# Patient Record
Sex: Male | Born: 1945 | Race: Black or African American | Hispanic: No | Marital: Married | Smoking: Former smoker
Health system: Southern US, Community
[De-identification: ages and names within clinical notes are randomized; demographics above are authoritative.]

## PROBLEM LIST (undated history)

## (undated) DIAGNOSIS — R296 Repeated falls: Secondary | ICD-10-CM

## (undated) DIAGNOSIS — I4891 Unspecified atrial fibrillation: Secondary | ICD-10-CM

## (undated) DIAGNOSIS — Z91199 Patient's noncompliance with other medical treatment and regimen due to unspecified reason: Secondary | ICD-10-CM

## (undated) DIAGNOSIS — J449 Chronic obstructive pulmonary disease, unspecified: Secondary | ICD-10-CM

## (undated) DIAGNOSIS — R531 Weakness: Secondary | ICD-10-CM

## (undated) DIAGNOSIS — I679 Cerebrovascular disease, unspecified: Secondary | ICD-10-CM

## (undated) DIAGNOSIS — I10 Essential (primary) hypertension: Secondary | ICD-10-CM

## (undated) DIAGNOSIS — K5900 Constipation, unspecified: Secondary | ICD-10-CM

## (undated) DIAGNOSIS — F1010 Alcohol abuse, uncomplicated: Secondary | ICD-10-CM

## (undated) DIAGNOSIS — N189 Chronic kidney disease, unspecified: Secondary | ICD-10-CM

## (undated) DIAGNOSIS — I219 Acute myocardial infarction, unspecified: Secondary | ICD-10-CM

## (undated) DIAGNOSIS — I5022 Chronic systolic (congestive) heart failure: Secondary | ICD-10-CM

## (undated) DIAGNOSIS — M1990 Unspecified osteoarthritis, unspecified site: Secondary | ICD-10-CM

## (undated) DIAGNOSIS — F32A Depression, unspecified: Secondary | ICD-10-CM

## (undated) DIAGNOSIS — I714 Abdominal aortic aneurysm, without rupture, unspecified: Secondary | ICD-10-CM

## (undated) DIAGNOSIS — R6889 Other general symptoms and signs: Secondary | ICD-10-CM

## (undated) DIAGNOSIS — Z9119 Patient's noncompliance with other medical treatment and regimen: Secondary | ICD-10-CM

## (undated) DIAGNOSIS — E785 Hyperlipidemia, unspecified: Secondary | ICD-10-CM

## (undated) DIAGNOSIS — I2510 Atherosclerotic heart disease of native coronary artery without angina pectoris: Secondary | ICD-10-CM

## (undated) DIAGNOSIS — I639 Cerebral infarction, unspecified: Secondary | ICD-10-CM

## (undated) DIAGNOSIS — Z720 Tobacco use: Secondary | ICD-10-CM

## (undated) DIAGNOSIS — D649 Anemia, unspecified: Secondary | ICD-10-CM

## (undated) DIAGNOSIS — K219 Gastro-esophageal reflux disease without esophagitis: Secondary | ICD-10-CM

## (undated) DIAGNOSIS — M79606 Pain in leg, unspecified: Secondary | ICD-10-CM

## (undated) DIAGNOSIS — F419 Anxiety disorder, unspecified: Secondary | ICD-10-CM

## (undated) DIAGNOSIS — F329 Major depressive disorder, single episode, unspecified: Secondary | ICD-10-CM

## (undated) DIAGNOSIS — M25461 Effusion, right knee: Secondary | ICD-10-CM

## (undated) DIAGNOSIS — F0390 Unspecified dementia without behavioral disturbance: Secondary | ICD-10-CM

## (undated) DIAGNOSIS — N400 Enlarged prostate without lower urinary tract symptoms: Secondary | ICD-10-CM

## (undated) DIAGNOSIS — R260 Ataxic gait: Secondary | ICD-10-CM

## (undated) HISTORY — DX: Patient's noncompliance with other medical treatment and regimen due to unspecified reason: Z91.199

## (undated) HISTORY — DX: Chronic obstructive pulmonary disease, unspecified: J44.9

## (undated) HISTORY — DX: Tobacco use: Z72.0

## (undated) HISTORY — DX: Anemia, unspecified: D64.9

## (undated) HISTORY — DX: Patient's noncompliance with other medical treatment and regimen: Z91.19

## (undated) HISTORY — DX: Alcohol abuse, uncomplicated: F10.10

## (undated) HISTORY — DX: Cerebrovascular disease, unspecified: I67.9

## (undated) HISTORY — DX: Abdominal aortic aneurysm, without rupture: I71.4

## (undated) HISTORY — DX: Hyperlipidemia, unspecified: E78.5

## (undated) HISTORY — DX: Unspecified atrial fibrillation: I48.91

## (undated) HISTORY — DX: Pain in leg, unspecified: M79.606

## (undated) HISTORY — DX: Abdominal aortic aneurysm, without rupture, unspecified: I71.40

## (undated) HISTORY — PX: ABDOMINAL AORTIC ANEURYSM REPAIR W/ ENDOLUMINAL GRAFT: SUR7

## (undated) HISTORY — PX: PORT-A-CATH REMOVAL: SHX5289

---

## 2002-05-21 ENCOUNTER — Ambulatory Visit (HOSPITAL_COMMUNITY): Admission: RE | Admit: 2002-05-21 | Discharge: 2002-05-21 | Payer: Self-pay | Admitting: Family Medicine

## 2002-05-21 ENCOUNTER — Encounter: Payer: Self-pay | Admitting: Family Medicine

## 2002-11-08 ENCOUNTER — Inpatient Hospital Stay (HOSPITAL_COMMUNITY): Admission: EM | Admit: 2002-11-08 | Discharge: 2002-11-11 | Payer: Self-pay | Admitting: Emergency Medicine

## 2002-11-08 ENCOUNTER — Encounter: Payer: Self-pay | Admitting: Emergency Medicine

## 2002-11-10 ENCOUNTER — Encounter: Payer: Self-pay | Admitting: General Surgery

## 2002-11-11 ENCOUNTER — Encounter: Payer: Self-pay | Admitting: General Surgery

## 2002-11-13 ENCOUNTER — Ambulatory Visit (HOSPITAL_COMMUNITY): Admission: RE | Admit: 2002-11-13 | Discharge: 2002-11-13 | Payer: Self-pay | Admitting: Family Medicine

## 2003-10-02 ENCOUNTER — Ambulatory Visit (HOSPITAL_COMMUNITY): Admission: RE | Admit: 2003-10-02 | Discharge: 2003-10-02 | Payer: Self-pay | Admitting: Family Medicine

## 2004-02-10 ENCOUNTER — Ambulatory Visit (HOSPITAL_COMMUNITY): Admission: RE | Admit: 2004-02-10 | Discharge: 2004-02-10 | Payer: Self-pay | Admitting: Family Medicine

## 2004-03-21 HISTORY — PX: COLONOSCOPY W/ POLYPECTOMY: SHX1380

## 2004-09-29 ENCOUNTER — Ambulatory Visit (HOSPITAL_COMMUNITY): Admission: RE | Admit: 2004-09-29 | Discharge: 2004-09-29 | Payer: Self-pay | Admitting: Family Medicine

## 2004-10-14 ENCOUNTER — Encounter (INDEPENDENT_AMBULATORY_CARE_PROVIDER_SITE_OTHER): Payer: Self-pay | Admitting: General Surgery

## 2004-10-14 ENCOUNTER — Ambulatory Visit (HOSPITAL_COMMUNITY): Admission: RE | Admit: 2004-10-14 | Discharge: 2004-10-14 | Payer: Self-pay | Admitting: General Surgery

## 2005-06-14 ENCOUNTER — Ambulatory Visit (HOSPITAL_COMMUNITY): Admission: RE | Admit: 2005-06-14 | Discharge: 2005-06-14 | Payer: Self-pay | Admitting: Family Medicine

## 2005-07-16 ENCOUNTER — Encounter: Admission: RE | Admit: 2005-07-16 | Discharge: 2005-07-16 | Payer: Self-pay | Admitting: Neurology

## 2007-08-10 ENCOUNTER — Ambulatory Visit (HOSPITAL_COMMUNITY): Admission: RE | Admit: 2007-08-10 | Discharge: 2007-08-10 | Payer: Self-pay | Admitting: Family Medicine

## 2007-09-03 ENCOUNTER — Ambulatory Visit: Payer: Self-pay | Admitting: Surgery

## 2007-12-31 ENCOUNTER — Ambulatory Visit: Payer: Self-pay | Admitting: Surgery

## 2007-12-31 ENCOUNTER — Encounter: Admission: RE | Admit: 2007-12-31 | Discharge: 2007-12-31 | Payer: Self-pay | Admitting: Surgery

## 2008-01-16 ENCOUNTER — Inpatient Hospital Stay (HOSPITAL_COMMUNITY): Admission: RE | Admit: 2008-01-16 | Discharge: 2008-01-17 | Payer: Self-pay | Admitting: Surgery

## 2008-01-16 ENCOUNTER — Ambulatory Visit: Payer: Self-pay | Admitting: Surgery

## 2008-02-11 ENCOUNTER — Encounter: Admission: RE | Admit: 2008-02-11 | Discharge: 2008-02-11 | Payer: Self-pay | Admitting: Surgery

## 2008-02-11 ENCOUNTER — Ambulatory Visit: Payer: Self-pay | Admitting: Surgery

## 2008-03-03 ENCOUNTER — Ambulatory Visit: Payer: Self-pay | Admitting: Surgery

## 2008-07-02 ENCOUNTER — Inpatient Hospital Stay (HOSPITAL_COMMUNITY): Admission: EM | Admit: 2008-07-02 | Discharge: 2008-07-04 | Payer: Self-pay | Admitting: Emergency Medicine

## 2008-07-02 ENCOUNTER — Encounter: Payer: Self-pay | Admitting: Family Medicine

## 2008-07-02 ENCOUNTER — Ambulatory Visit: Payer: Self-pay | Admitting: Cardiology

## 2008-07-07 ENCOUNTER — Ambulatory Visit: Payer: Self-pay | Admitting: Cardiology

## 2008-07-07 ENCOUNTER — Encounter (HOSPITAL_COMMUNITY): Admission: RE | Admit: 2008-07-07 | Discharge: 2008-08-06 | Payer: Self-pay | Admitting: Cardiology

## 2008-07-21 ENCOUNTER — Ambulatory Visit: Payer: Self-pay | Admitting: Cardiology

## 2008-07-21 LAB — CONVERTED CEMR LAB
BUN: 17 mg/dL
Chloride: 104 meq/L
Creatinine, Ser: 1.25 mg/dL
Potassium: 4.8 meq/L
Sodium: 143 meq/L

## 2008-09-01 ENCOUNTER — Encounter: Admission: RE | Admit: 2008-09-01 | Discharge: 2008-09-01 | Payer: Self-pay | Admitting: Surgery

## 2008-09-01 ENCOUNTER — Ambulatory Visit: Payer: Self-pay | Admitting: Surgery

## 2008-10-22 ENCOUNTER — Ambulatory Visit: Payer: Self-pay | Admitting: Cardiology

## 2008-10-22 ENCOUNTER — Encounter: Payer: Self-pay | Admitting: Cardiology

## 2008-10-22 DIAGNOSIS — I714 Abdominal aortic aneurysm, without rupture, unspecified: Secondary | ICD-10-CM | POA: Insufficient documentation

## 2008-12-15 ENCOUNTER — Encounter: Payer: Self-pay | Admitting: Cardiology

## 2009-03-16 ENCOUNTER — Ambulatory Visit: Payer: Self-pay | Admitting: Surgery

## 2009-03-21 DIAGNOSIS — I639 Cerebral infarction, unspecified: Secondary | ICD-10-CM

## 2009-03-21 DIAGNOSIS — I679 Cerebrovascular disease, unspecified: Secondary | ICD-10-CM

## 2009-03-21 HISTORY — DX: Cerebrovascular disease, unspecified: I67.9

## 2009-03-21 HISTORY — DX: Cerebral infarction, unspecified: I63.9

## 2009-05-21 ENCOUNTER — Encounter (INDEPENDENT_AMBULATORY_CARE_PROVIDER_SITE_OTHER): Payer: Self-pay | Admitting: *Deleted

## 2009-05-21 LAB — CONVERTED CEMR LAB
Bilirubin, Direct: 0.3 mg/dL
Cholesterol: 241 mg/dL
HDL: 52 mg/dL
LDL Cholesterol: 175 mg/dL
Total Protein: 8.1 g/dL
Triglycerides: 71 mg/dL

## 2009-05-22 ENCOUNTER — Encounter (INDEPENDENT_AMBULATORY_CARE_PROVIDER_SITE_OTHER): Payer: Self-pay | Admitting: *Deleted

## 2009-05-26 ENCOUNTER — Ambulatory Visit: Payer: Self-pay | Admitting: Cardiology

## 2009-05-26 ENCOUNTER — Encounter (INDEPENDENT_AMBULATORY_CARE_PROVIDER_SITE_OTHER): Payer: Self-pay | Admitting: *Deleted

## 2009-06-08 ENCOUNTER — Ambulatory Visit: Payer: Self-pay | Admitting: Orthopedic Surgery

## 2009-06-26 ENCOUNTER — Encounter: Payer: Self-pay | Admitting: Cardiology

## 2009-06-26 ENCOUNTER — Encounter (INDEPENDENT_AMBULATORY_CARE_PROVIDER_SITE_OTHER): Payer: Self-pay | Admitting: *Deleted

## 2009-06-26 LAB — CONVERTED CEMR LAB
Albumin: 4.7 g/dL
CO2: 22 meq/L
Calcium: 9.7 mg/dL
Chloride: 105 meq/L
Creatinine, Ser: 1.35 mg/dL
LDL Cholesterol: 139 mg/dL
Sodium: 139 meq/L
Total Protein: 7.5 g/dL

## 2009-06-30 ENCOUNTER — Ambulatory Visit: Payer: Self-pay | Admitting: Cardiology

## 2009-07-02 ENCOUNTER — Encounter: Payer: Self-pay | Admitting: Cardiology

## 2009-07-02 ENCOUNTER — Encounter (INDEPENDENT_AMBULATORY_CARE_PROVIDER_SITE_OTHER): Payer: Self-pay | Admitting: *Deleted

## 2009-07-02 LAB — CONVERTED CEMR LAB
Albumin: 4.7 g/dL (ref 3.5–5.2)
Alkaline Phosphatase: 77 units/L (ref 39–117)
BUN: 16 mg/dL (ref 6–23)
Glucose, Bld: 77 mg/dL (ref 70–99)
HDL: 53 mg/dL (ref >39)
LDL Cholesterol: 139 mg/dL — ABNORMAL HIGH (ref 0–99)
Potassium: 4.2 meq/L (ref 3.5–5.3)
Triglycerides: 79 mg/dL (ref <150)

## 2009-09-14 ENCOUNTER — Encounter: Payer: Self-pay | Admitting: Internal Medicine

## 2009-09-14 ENCOUNTER — Ambulatory Visit: Payer: Self-pay | Admitting: Surgery

## 2010-01-25 ENCOUNTER — Encounter: Payer: Self-pay | Admitting: Cardiology

## 2010-02-01 ENCOUNTER — Encounter (INDEPENDENT_AMBULATORY_CARE_PROVIDER_SITE_OTHER): Payer: Self-pay | Admitting: *Deleted

## 2010-02-09 ENCOUNTER — Encounter (INDEPENDENT_AMBULATORY_CARE_PROVIDER_SITE_OTHER): Payer: Self-pay | Admitting: *Deleted

## 2010-02-09 ENCOUNTER — Ambulatory Visit: Payer: Self-pay | Admitting: Cardiology

## 2010-02-09 DIAGNOSIS — J449 Chronic obstructive pulmonary disease, unspecified: Secondary | ICD-10-CM

## 2010-03-12 ENCOUNTER — Ambulatory Visit: Payer: Self-pay | Admitting: Cardiology

## 2010-03-12 ENCOUNTER — Ambulatory Visit (HOSPITAL_COMMUNITY)
Admission: RE | Admit: 2010-03-12 | Discharge: 2010-03-12 | Payer: Self-pay | Source: Home / Self Care | Attending: Family Medicine | Admitting: Family Medicine

## 2010-03-16 ENCOUNTER — Encounter (INDEPENDENT_AMBULATORY_CARE_PROVIDER_SITE_OTHER): Payer: Self-pay | Admitting: *Deleted

## 2010-03-16 LAB — CONVERTED CEMR LAB
Cholesterol: 242 mg/dL — ABNORMAL HIGH (ref 0–200)
Triglycerides: 75 mg/dL (ref <150)

## 2010-03-18 ENCOUNTER — Ambulatory Visit (HOSPITAL_COMMUNITY)
Admission: RE | Admit: 2010-03-18 | Discharge: 2010-03-18 | Payer: Self-pay | Source: Home / Self Care | Attending: Family Medicine | Admitting: Family Medicine

## 2010-03-26 ENCOUNTER — Ambulatory Visit
Admission: RE | Admit: 2010-03-26 | Discharge: 2010-03-26 | Payer: Self-pay | Source: Home / Self Care | Attending: Cardiology | Admitting: Cardiology

## 2010-03-29 ENCOUNTER — Ambulatory Visit
Admission: RE | Admit: 2010-03-29 | Discharge: 2010-03-29 | Payer: Self-pay | Source: Home / Self Care | Attending: Surgery | Admitting: Surgery

## 2010-03-29 ENCOUNTER — Ambulatory Visit: Admit: 2010-03-29 | Payer: Self-pay | Admitting: Surgery

## 2010-04-12 ENCOUNTER — Encounter: Payer: Self-pay | Admitting: Surgery

## 2010-04-20 NOTE — Assessment & Plan Note (Signed)
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  With a heart rate of 63, I am not inclined to further increase in a blocker.  Clonidine will be added at a dose of 0.1 mg b.i.d.   Blood Pressure will be reassessed in one month by the cardiology nurses  Patient advised and states he understands instructions given.   Larita Fife Via LPN  July 02, 2009 11:46 AM   Visit Type:  BP check/Nurse visit Referring Provider:  Dr. [name] Primary Provider:  Dr. [name]   History of Present Illness: S: Pt. arrives in office for BP check. B: Due to suboptimal BP, on last OV of 05-26-09, Metoprolol Succ. 50mg  once daily was added. He also takes Amlodipine 10mg  once daily and Lisinopril 40mg  once daily for HTN.  A: Pt. states he has had no complaints since starting Metoprolol. BP= 165/87, pt. has not kept a BP diary due to problems with his BP cuff.  R: Pt. advised to bring his BP cuff to this office or pharmacy to have calibrated.    FYI: pt. has been taking Crestor 40mg  once daily and Simvastatin 80mg  once daily. Advised to stop taking Simvastatin, he states he understands.       Current Medications (verified): 1)  Aggrenox 25-200 Mg Xr12h-Cap (Aspirin-Dipyridamole) .... Take 1 Tablet By Mouth Once Daily 2)  Lasix 40 Mg Tabs (Furosemide) .Marland Kitchen.. 1 Tab Once Daily 3)  Norvasc 10 Mg Tabs (Amlodipine Besylate) .Marland Kitchen.. 1 Tab Once Daily 4)  Lisinopril 40 Mg Tabs (Lisinopril) .... Take 1 Tablet Once Daily 5)  Crestor 40 Mg Tabs (Rosuvastatin Calcium) .... Take One Tablet By Mouth Daily. 6)  Wellbutrin Xl 150 Mg  Xr24h-Tab (Bupropion Hcl) .... Take 1 Tablet By Mouth Once A Day X 3 Days Then Increase To Two Times A Day 7)  Metoprolol Succinate 50 Mg Xr24h-Tab (Metoprolol Succinate) .... Take One Tablet By Mouth Daily  Allergies: No Known Drug Allergies Prescriptions: CLONIDINE HCL 0.1 MG TABS (CLONIDINE HCL) Take 1 tablet by mouth two times a day  #60 x 6   Entered by:   Larita Fife Via LPN   Authorized by:   Kathlen Brunswick, MD, [facility]   Signed by:   Larita Fife Via LPN on 16/12/9602   Method used:   [facility] to        [facility]. [phone]* (retail)       [address]       [place], [place]  [address]       Ph: [phone] or [phone]       Fax: [phone]   RxID:   [id number]

## 2010-04-20 NOTE — Miscellaneous (Signed)
Summary: cmp,lipids,06/26/2009  Clinical Lists Changes  Observations: Added new observation of CALCIUM: 9.7 mg/dL (45/40/9811 9:14) Added new observation of ALBUMIN: 4.7 g/dL (78/29/5621 3:08) Added new observation of PROTEIN, TOT: 7.5 g/dL (65/78/4696 2:95) Added new observation of SGPT (ALT): 12 units/L (06/26/2009 8:54) Added new observation of SGOT (AST): 17 units/L (06/26/2009 8:54) Added new observation of ALK PHOS: 77 units/L (06/26/2009 8:54) Added new observation of CREATININE: 1.35 mg/dL (28/41/3244 0:10) Added new observation of BUN: 16 mg/dL (27/25/3664 4:03) Added new observation of BG RANDOM: 77 mg/dL (47/42/5956 3:87) Added new observation of CO2 PLSM/SER: 22 meq/L (06/26/2009 8:54) Added new observation of CL SERUM: 105 meq/L (06/26/2009 8:54) Added new observation of K SERUM: 4.2 meq/L (06/26/2009 8:54) Added new observation of NA: 139 meq/L (06/26/2009 8:54) Added new observation of LDL: 139 mg/dL (56/43/3295 1:88) Added new observation of HDL: 53 mg/dL (41/66/0630 1:60) Added new observation of TRIGLYC TOT: 79 mg/dL (10/93/2355 7:32) Added new observation of CHOLESTEROL: 208 mg/dL (20/25/4270 6:23)

## 2010-04-20 NOTE — Letter (Signed)
Summary: [place] [facility], agricultural at [employer]  [address], [place] [address]   Phone: [phone]  Fax: [phone]     February 09, 2010 MRN: [id number]   [facility] [address] [place], [place]  [address]      YOUR LAB WORK IS DUE   March 11, 2010  Please go to [facility], located across the street from [facility] on the second floor.  Hours are Monday - Friday 7am until 7:30pm         Saturday 8am until 12noon    _X_  DO NOT EAT OR DRINK AFTER MIDNIGHT EVENING PRIOR TO LABWORK

## 2010-04-20 NOTE — Assessment & Plan Note (Signed)
Summary: rt knee pain needs xr/bcbs/luking   Visit Type:  Initial Consult Referring Provider:  Dr. [name] Primary Provider:  Dr. [name]  CC:  right knee pain.[name]  History of Present Illness: This is a 65 year old male soon to be 89 presents with pain in the front of his knee for one month.  His pain is unrelieved by Tylenol extra strength.  He reports some swelling and pain when he flexes his knee.  He notices that when he gets in and out of a car he has to pick his leg up with his hands.  He has a lot of problems climbing the steps.  His pain is sharp intermittent came on suddenly when it started.  He reports some locking some catching some swelling    Xrays today.   Allergies: No Known Drug Allergies  Past History:  Past Medical History: Last updated: 05/26/2009 Diastolic congestive heart failure-pulmonary edema in 2010 Hypertension Hyperlipidemia History of abdominal aortic aneurysm-stent graft repair-2009. Cerebrovascular disease-History of stroke COPD Tobacco abuse Diverticulosis Colon Polyps  Past Surgical History: Last updated: 05/26/2009 Endovascular repair of abdominal aortic aneurysm October 2009 Excision of colonic polyps via colonoscopy-2006  Family History: Last updated: 05/26/2009 positive for atherosclerotic disease, hypertension and hyperlipidemia  Social History: Last updated: 05/26/2009 Employment-retired Tobacco Use - 60-pack-year history continuing at 1/2-1 PPD  Alcohol Use - 5-7 ounces per day Married with 3 adult children  Risk Factors: Smoking Status: current (07/25/2008)  Review of Systems Respiratory:  Complains of snoring; denies short of breath, wheezing, couch, tightness, pain on inspiration, and snoring . Neurologic:  Complains of unsteady gait; denies numbness, tingling, dizziness, tremors, and seizure. Musculoskeletal:  Complains of joint pain and swelling; denies instability, stiffness, redness, heat, and muscle  pain.  The review of systems is negative for Constitutional, Cardiovascular, Gastrointestinal, Genitourinary, Endocrine, Psychiatric, Skin, HEENT, Immunology, and Hemoatologic.  Physical Exam  Additional Exam:  GEN: well developed, well nourished, normal grooming and hygiene, no deformity and Sliml body habitus.   CDV: pulses are normal, no edema, no erythema. no tenderness  Lymph: normal lymph nodes   Skin: no rashes, skin lesions or open sores   NEURO: normal coordination, reflexes, sensation.   Psyche: awake, alert and oriented. Mood normal   Gait: has a slightly flexed knee gait, there is flexion posture and spine,  RIGHT knee exam  He is nontender along both joint lines he is painful range of motion up to 100 he is tender over the inferior pole of the patella there is no joint effusion motor exam normal, There no instability, no meniscal sign  LEFT knee exam no joint effusion, full range of motion, normal motor strength, no joint laxity.  No tenderness.     Impression & Recommendations: order 3 views RIGHT knee There is diffuse mild joint space narrowing and there is some patellofemoral spurring at the margins  Impression mild osteoarthritis  Overall impression patella tendinitis  Recommend anti-inflammatories, ice.  Other Orders: New Patient Level III (09811) Knee x-ray,  3 views (91478)  Patient Instructions: 1)  YOU HAVE PATELLA TENDONITIS  2)  ICE THE AREA EVERY DAY FOR 20 MINUTES  FOR 6 WEEKS  3)  TAKE DICLOFENAC 50 MG two times a day FOR 6  WEEKS  4)  Please schedule a follow-up appointment as needed.

## 2010-04-20 NOTE — Letter (Signed)
Summary: [place] [facility] [employer], agricultural at [employer]  [address] [place] [address]   Phone: [phone]  Fax: [phone]     July 02, 2009 MRN: [id number]   [facility] [address] [place], [place]  [address]      YOUR LAB WORK IS DUE   Aug 03, 2009  Please go to [facility], located across the street from [facility] on the second floor.  Hours are Monday - Friday 7am until 7:30pm         Saturday 8am until 12noon    _X_  DO NOT EAT OR DRINK AFTER MIDNIGHT EVENING PRIOR TO LABWORK  __ YOUR LABWORK IS NOT FASTING --YOU MAY EAT PRIOR TO LABWORK

## 2010-04-20 NOTE — Assessment & Plan Note (Signed)
Summary: 6 MTH F/U PER CHECKOUT ON 10/22/08/TG   Visit Type:  Follow-up Primary Provider:  Dr. [name]   History of Present Illness: Return visit for this very pleasant 65 year old gentleman with a history of congestive heart failure, preserved left ventricular systolic function, hypertension and hyperlipidemia.  Since his last visit, he has done beautifully.  He denies all cardiopulmonary symptoms.  Blood pressures are occasionally assessed at home with systolic typically 140 and diastolics less than 90.  A recent lipid profile was very suboptimal.  Unfortunately, he has not been able to stop smoking cigarettes and requests assistance.  CXR  Procedure date:  07/03/2008  Findings:      Resolution of perivascular edema Cardiomegaly Pulmonary vascular congestion  Nuclear Study  Procedure date:  07/23/2008  Findings:      MUGA  Ejection fraction of 58%  Nuclear ETT  Procedure date:  07/07/2008  Findings:       Achieved a workload of 7 minutes and a heart rate of 133 Mildly hypertensive blood pressure response Nondiagnostic stress EKG due to baseline abnormalities Left ventricle-moderately dilated; moderate dysfunction(EF-.34) Diaphragmatic attenuation vs. minimal inferior scarring; no ischemia  -  Date:  07/21/2008    BG Random: 80    BUN: 17    Creatinine: 1.25    Sodium: 143    Potassium: 4.8    Chloride: 104    CO2 Total: 23    Calcium: 10   Current Medications (verified): 1)  Aggrenox 25-200 Mg Xr12h-Cap (Aspirin-Dipyridamole) .Marland Kitchen.. 1 Tab Two Times A Day 2)  Lasix 40 Mg Tabs (Furosemide) .Marland Kitchen.. 1 Tab Once Daily 3)  Norvasc 10 Mg Tabs (Amlodipine Besylate) .Marland Kitchen.. 1 Tab Once Daily 4)  Lisinopril 40 Mg Tabs (Lisinopril) .... Take 1 Tablet Once Daily 5)  Simvastatin 80 Mg Tabs (Simvastatin) .Marland Kitchen.. 1 Tab Once Daily 6)  Crestor 40 Mg Tabs (Rosuvastatin Calcium) .... Take One Tablet By Mouth Daily. 7)  Wellbutrin Xl 150 Mg Xr24h-Tab (Bupropion Hcl) .... Take 1 Tablet  By Mouth Once A Day X 3 Days Then Increase To Two Times A Day 8)  Metoprolol Succinate 50 Mg Xr24h-Tab (Metoprolol Succinate) .... Take One Tablet By Mouth Daily  Allergies (verified): No Known Drug Allergies  Past History:  PMH, FH, and Social History reviewed and updated.  Past Medical History: Diastolic congestive heart failure-pulmonary edema in 2010 Hypertension Hyperlipidemia History of abdominal aortic aneurysm-stent graft repair-2009. Cerebrovascular disease-History of stroke COPD Tobacco abuse Diverticulosis Colon Polyps  Past Surgical History: Endovascular repair of abdominal aortic aneurysm October 2009 Excision of colonic polyps via colonoscopy-2006  Family History: positive for atherosclerotic disease, hypertension and hyperlipidemia  Social History: Employment-retired Tobacco Use - 60-pack-year history continuing at 1/2-1 PPD  Alcohol Use - 5-7 ounces per day Married with 3 adult children  Review of Systems  The patient denies weight loss, weight gain, chest pain, syncope, dyspnea on exertion, peripheral edema, prolonged cough, headaches, hemoptysis, abdominal pain, and melena.    Vital Signs:  Patient profile:   65 year old male Weight:      193 pounds Pulse rate:   73 / minute BP sitting:   154 / 88  (right arm)  Vitals Entered ByLarita Fife Via LPN (May 27, 4694 2:12 PM)  Physical Exam  General:    Well developed, well nourished, in no acute           distress; tall Mouth:  Teeth, gums and palate normal. Oral mucosa normal. Neck:  Neck supple,  no JVD. No masses; no bruits Chest Wall:  no deformities or breast masses noted Lungs:  Clear bilaterally to auscultation and percussion. Heart:  Split first heart sound vs. S4; normal second heart sound; no gallop/murmur Abdomen:  Bowel sounds positive; abdomen soft and non-tender without masses, organomegaly, or hernias noted. No hepatosplenomegaly. Msk:  Back normal, normal gait. Muscle strength and tone  normal. Pulses:  normal distal pulses Extremities:  No clubbing or cyanosis; no edema; long fingers Neurologic:  Alert and oriented x 3. Skin:  Intact without lesions or rashes. Psych:  Normal affect.    Impression & Recommendations:  Problem # 1:  HYPERLIPIDEMIA, MIXED (ICD-272.2)  Lipids were suboptimal in March despite treatment with high-dose simvastatin.  Rosuvastatin will be substituted at a dose of 40 mg q.d. with a lipid and metabolic profile to be obtained in one month.  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  Metoprolol succinate 50 mg q.d. will be added to his medical regime.  He will monitor blood pressure at home and return for reassessment by the cardiology nurse in one month.  Problem # 3:  TOBACCO ABUSE (ICD-305.1) He is eager to stop smoking, which is great.  Wellbutrin will be described as well as establishing a quit date.  He was provided with smoking cessation information and the number for the [place] quit line.  I will plan see this nice gentleman again in 6 months.  Other Orders: Future Orders: T-Comprehensive Metabolic Panel ([id number]) ... 06/26/2009 T-Lipid Profile [id number]) ... 06/26/2009  Patient Instructions: 1)  Your physician recommends that you schedule a follow-up appointment in: 6 MONTHS 2)  Your physician recommends that you return for lab work in: 1 MONTH 3)  Your physician has recommended you make the following change in your medication:  CRESTOR 40MG  DAILY, METOPOLOL SUCCINATE 50MG  DAILY, WELBUTRIN 150MG  DAILY X 3 DAYS THEN INCREASE TO  TWICE A DAY 4)  You have been referred to 5)  Your physician has requested that you regularly monitor and record your blood pressure readings at home.  Please use the same machine at the same time of day to check your readings and record them to bring to your follow-up visit. Prescriptions: METOPROLOL SUCCINATE 50  MG XR24H-TAB (METOPROLOL SUCCINATE) Take one tablet by mouth daily  #30 x 3   Entered by:   Teressa Lower RN   Authorized by:   Kathlen Brunswick, MD, [facility]   Signed by:   Teressa Lower RN on 05/26/2009   Method used:   [facility]ronically to        [facility] [phone]* (retail)       [address]       [place], [place]  [address]       Ph: [phone] or [phone]       Fax: [phone]   RxID:   [id number] WELLBUTRIN XL 150 MG XR24H-TAB (BUPROPION HCL) Take 1 tablet by mouth once a day x 3 days then increase to two times a day  #60 x 3   Entered by:   Teressa Lower RN   Authorized by:   Kathlen Brunswick, MD, [facility]   Signed by:   Teressa Lower RN on 05/26/2009   Method used:   Electronically to        [facility] [phone]* (retail)       [address]  [place], [place]  [address]       Ph: [phone] or [phone]       Fax: [phone]   RxID:   [id number] CRESTOR 40 MG TABS (ROSUVASTATIN CALCIUM) Take one tablet by mouth daily.  #30 x 3   Entered by:   Teressa Lower RN   Authorized by:   Kathlen Brunswick, MD, [facility]   Signed by:   Teressa Lower RN on 05/26/2009   Method used:   Electronically to        [facility] [phone]* (retail)       [address]       [place], [place]  [address]       Ph: [phone] or [phone]       Fax: [phone]   RxID:   [id number]

## 2010-04-20 NOTE — Miscellaneous (Signed)
Summary: Orders Update/lipids 1 month  Clinical Lists Changes  Orders: Added new Test order of T-Lipid Profile [id number]) - Signed

## 2010-04-20 NOTE — Assessment & Plan Note (Signed)
Summary: Z6X   Visit Type:  Follow-up Referring [name]:  . Primary [name]:  [name]   History of Present Illness: [name] to the office as scheduled for continuing assessment and treatment of vascular disease, hypertensive heart disease and other vascular risk factors.  Since his last visit, he has been generally well.  He notes no dyspnea nor chest discomfort.  He has not assessed blood pressure on his own, but elevated values were noted at a recent office visit with [name] prompting the addition of clonidine 0.1 mg b.i.d. to his medical regime.  He has experienced no adverse effects from this medication.  He attempted to discontinue cigarette smoking while taking Wellbutrin.  This was initially successful, and he completely refrained from smoking for one month.  Unfortunately, he subsequently made the conscious decision to resume and is now smoking 1/2 pack per day.  Lipid profile in 06/2009 was suboptimal with an LDL level in excess of 130 despite Crestor 40 mg q.d.  Current Medications (verified): 1)  Aggrenox 25-200 Mg Xr12h-Cap (Aspirin-Dipyridamole) .... Take 1 Tablet By Mouth Once Daily 2)  Lasix 40 Mg Tabs (Furosemide) .Marland Kitchen.. 1 Tab Once Daily 3)  Norvasc 10 Mg Tabs (Amlodipine Besylate) .Marland Kitchen.. 1 Tab Once Daily 4)  Lisinopril 40 Mg Tabs (Lisinopril) .... Take 1 Tablet Once Daily 5)  Crestor 40 Mg Tabs (Rosuvastatin Calcium) .... Take One Tablet By Mouth Daily. 6)  Wellbutrin Xl 150 Mg Xr24h-Tab (Bupropion Hcl) .... Take 1 Tablet By Mouth Once Daily 7)  Metoprolol Succinate 50 Mg Xr24h-Tab (Metoprolol Succinate) .... Take One Tablet By Mouth Daily 8)  Clonidine Hcl 0.2 Mg Tabs (Clonidine Hcl) .... Take 1 Tablet By Mouth Two Times A Day 9)  Diclofenac Sodium 50 Mg Tbec (Diclofenac Sodium) .... Take 1 Tab Two Times A Day 10)  Welchol 625 Mg Tabs (Colesevelam Hcl) .... Take 2 Tablets By Mouth Two Times A Day  Allergies (verified): No Known Drug  Allergies  Comments:  Nurse/Medical Assistant: patient was started on diclofenac sod by [name] also welbutrion 150mg  was suppose to be two times a day but patient is only taking this 1 time daily  Past History:  PMH, FH, and Social History reviewed and updated.  Review of Systems  The patient denies anorexia, weight loss, weight gain, hoarseness, chest pain, syncope, dyspnea on exertion, peripheral edema, prolonged cough, headaches, hemoptysis, and abdominal pain.    Vital Signs:  Patient profile:   65 year old male Weight:      191 pounds BMI:     27.50 O2 Sat:      96 % on Room air Pulse rate:   77 / minute BP sitting:   159 / 97  (right arm)  Vitals Entered By: Greg Saa, [name] (February 09, 2010 1:07 PM)  O2 Flow:  Room air  Physical Exam  General:  Proportionate weight and height; well nourished, in no acute distress Neck:  Neck supple, no JVD. No masses; no bruits Chest Wall:  no deformities or breast masses noted Lungs:  Clear bilaterally to auscultation and percussion; mildly decreased breath sounds at the bases with prolonged expiratory phase Heart:  Split first heart sound; increased intensity of the pulmonic component of the second heart sound; no gallop/murmur Abdomen:  Bowel sounds positive; abdomen soft and non-tender without masses, organomegaly, or hernias noted. No hepatosplenomegaly. Pulses:  normal distal pulses Extremities:  No clubbing or cyanosis; no edema; long fingers Neurologic:  Alert and oriented  x 3. Skin:  Intact without lesions or rashes. Psych:  Normal affect.    Impression & Recommendations:  Problem # 1:  HYPERTENSIVE HEART DISEASE; H/O CHF (ICD-402.11) Patient is doing generally well; however, blood pressure control remains suboptimal.  His dose of clonidine will be increased to 0.2 mg b.i.d. with a blood pressure check by the cardiology nurses in one month.  Problem # 2:  HYPERLIPIDEMIA (ICD-272.2) WelChol 2 tablets b.i.d.  will be added to the patient's medical regime was a repeat lipid profile in one month.  Problem # 3:  TOBACCO ABUSE (ICD-305.1) Patient congratulated on stopping smoking for one month.  I explained to him that multiple quit attempts may be necessary.  He promises to try again, but does not appear to be committed to do so.  Other Orders: Future Orders: T-Lipid Profile ([id number]) ... 03/11/2010  Patient Instructions: 1)  Your physician recommends that you schedule a follow-up appointment in: Blood pressure check in 1 month and in 7 months 2)  Your physician recommends that you return for lab work in: 1 month 3)  Your physician has recommended you make the following change in your medication: Start taking Welchol 625mg  by mouth two times a day and Clonidine 0.2mg  by mouth two times a day  Prescriptions: CLONIDINE HCL 0.2 MG TABS (CLONIDINE HCL) take 1 tablet by mouth two times a day  #60 x 7   Entered by:   [name]   Authorized by:   Greg Brunswick, MD, [facility]   Signed by:   [name] on 09/81/1914   Method used:   Electronically to        [facility] [phone]* (retail)       [address]       [place], [place]  [address]       Ph: [phone] or [phone]       Fax: [phone]   RxID:   [id number] WELCHOL 625 MG TABS (COLESEVELAM HCL) take 2 tablets by mouth two times a day  #120 x 7   Entered by:   [name]   Authorized by:   Greg Brunswick, MD, [facility]   Signed by:   [name] on 56/43/3295   Method used:   Electronically to        [facility]. [phone]* (retail)       [address]       [place], [place]  [address]       Ph: [phone] or [phone]       Fax: [phone]   RxID:   [id number]   Appended Document: labs    Clinical Lists Changes  Observations: Added new observation of CALCIUM: 9.4 mg/dL (26/94/8546 27:03) Added new observation of ALBUMIN: 4.1 g/dL (50/11/3816 29:93) Added new observation  of PROTEIN, TOT: 6.9 g/dL (71/69/6789 38:10) Added new observation of SGPT (ALT): 13 units/L (02/03/2010 11:50) Added new observation of SGOT (AST): 22 units/L (02/03/2010 11:50) Added new observation of ALK PHOS: 81 units/L (02/03/2010 11:50) Added new observation of BILI DIRECT: 0.28 mg/dL (17/51/0258 52:77) Added new observation of GFR AA: 54 mL/min/1.33m2 (02/03/2010 11:50) Added new observation of GFR: 46 mL/min (02/03/2010 11:50) Added new observation of CREATININE: 1.55 mg/dL (82/42/3536 14:43) Added new observation of BUN: 13 mg/dL (15/40/0867 61:95) Added new observation of BG RANDOM: 98 mg/dL (09/32/6712 45:80) Added new observation of CL  SERUM: 107 meq/L (02/03/2010 11:50) Added new observation of K SERUM: 4.8 meq/L (02/03/2010 11:50) Added new observation of NA: 143 meq/L (02/03/2010 11:50) Added new observation of LDL: 144 mg/dL (95/18/8416 60:63) Added new observation of HDL: 66 mg/dL (01/60/1093 23:55) Added new observation of TRIGLYC TOT: 63 mg/dL (73/22/0254 27:06) Added new observation of CHOLESTEROL: 223 mg/dL (23/76/2831 51:76) Added new observation of PLATELETK/UL: 222 K/uL (02/03/2010 11:50) Added new observation of MCV: 103 fL (02/03/2010 11:50) Added new observation of HCT: 45.8 % (02/03/2010 11:50) Added new observation of HGB: 15.9 g/dL (16/09/3708 62:69) Added new observation of WBC COUNT: 5.5 10*3/microliter (02/03/2010 11:50)

## 2010-04-20 NOTE — Miscellaneous (Signed)
Summary: LABS LIPIDS,LIVER,05/21/2009  Clinical Lists Changes  Observations: Added new observation of ALBUMIN: 4.5 g/dL (29/56/2130 86:57) Added new observation of PROTEIN, TOT: 8.1 g/dL (84/69/6295 28:41) Added new observation of SGPT (ALT): 12 units/L (05/21/2009 14:45) Added new observation of SGOT (AST): 19 units/L (05/21/2009 14:45) Added new observation of ALK PHOS: 79 units/L (05/21/2009 14:45) Added new observation of BILI DIRECT: 0.3 mg/dL (32/44/0102 72:53) Added new observation of LDL: 175 mg/dL (66/44/0347 42:59) Added new observation of HDL: 52 mg/dL (56/38/7564 33:29) Added new observation of TRIGLYC TOT: 71 mg/dL (51/88/4166 06:30) Added new observation of CHOLESTEROL: 241 mg/dL (16/03/930 35:57)

## 2010-04-20 NOTE — Letter (Signed)
Summary: [place] [facility], agricultural at [employer]  [address], [place] [address]   Phone: [phone]  Fax: [phone]     May 26, 2009 MRN: [id number]   Akshar Nachtigal [address] [place], [place]  [address]      YOUR LAB WORK IS DUE   _________APRIL 8, 2011________________________________  Please go to [facility], located across the street from [facility] on the second floor.  Hours are Monday - Friday 7am until 7:30pm         Saturday 8am until 12noon    _X_  DO NOT EAT OR DRINK AFTER MIDNIGHT EVENING PRIOR TO LABWORK  __ YOUR LABWORK IS NOT FASTING --YOU MAY EAT PRIOR TO LABWORK

## 2010-04-20 NOTE — Letter (Signed)
Summary: [facility]  [facility]   Imported By: Maryln Gottron 10/07/2009 10:44:54  _____________________________________________________________________  External Attachment:    Type:   Image     Comment:   External Document

## 2010-04-20 NOTE — Letter (Signed)
Summary: *Orthopedic Consult Note  [facility]  [address]  [place], [place] [address]   Phone: [phone]  Fax: [phone]    Re:    [name] DOB:    1945/08/28   Dear: Lorin Picket   Thank you for requesting that we see the above patient for consultation.  A copy of the detailed office note will be sent under separate cover, for your review.  Evaluation today is consistent with: patellar tendinitis   Our recommendation is for: anti-inflammatories and ice for 6 weeks       Thank you for this opportunity to look after your patient.  Sincerely,   Terrance Mass. MD.

## 2010-04-20 NOTE — Letter (Signed)
Summary: [place] [facility]  [facility]   Imported By: Faythe Ghee 01/25/2010 16:43:56  _____________________________________________________________________  External Attachment:    Type:   Image     Comment:   External Document

## 2010-04-20 NOTE — Letter (Signed)
Summary: Historic Patient File  Historic Patient File   Imported By: Elvera Maria 06/19/2009 10:03:00  _____________________________________________________________________  External Attachment:    Type:   Image     Comment:   history form

## 2010-04-20 NOTE — Letter (Signed)
Summary: [place] Results Engineer, agricultural at [facility]  [address], [place] [address]   Phone: [phone]  Fax: [phone]      July 02, 2009 MRN: [id number]   [name] [address] [place], [place]  [address]   Dear [name],  Your test ordered by Selena Batten has been reviewed by your physician (or physician assistant) and was found to be normal or stable. Your physician (or physician assistant) felt no changes were needed at this time.  ____ Echocardiogram  ____ Cardiac Stress Test  __x__ Lab Work  ____ Peripheral vascular study of arms, legs or neck  ____ CT scan or X-ray  ____ Lung or Breathing test  ____ Other:  Please continue to take your crestor 40mg  daily, and eat a low cholesterol diet.  Enclosed is a copy of your labwork and information on the diet.  Please call our office if you need our assistance.  Thank you, [name] [name] RN    [name], MD, Lenise Arena.C.Gaylord Shih, MD, F.A.C.C Lewayne Bunting, MD, F.A.C.C Nona Dell, MD, F.A.C.C Charlton Haws, MD, Lenise Arena.C.C

## 2010-04-22 NOTE — Assessment & Plan Note (Signed)
Summary: NURSE VISIT  Nurse Visit   Vital Signs:  Patient profile:   65 year old male Height:      70 inches Weight:      189 pounds O2 Sat:      97 % on Room air Pulse rate:   92 / minute BP sitting:   160 / 81  (left arm)  Vitals Entered By: Teressa Lower RN (March 26, 2010 9:20 AM)  O2 Flow:  Room air  Current Medications (verified): 1)  Aggrenox 25-200 Mg Xr12h-Cap (Aspirin-Dipyridamole) .... Take 1 Tablet By Mouth Once Daily 2)  Lasix 40 Mg Tabs (Furosemide) .Marland Kitchen.. 1 Tab Once Daily 3)  Norvasc 10 Mg Tabs (Amlodipine Besylate) .Marland Kitchen.. 1 Tab Once Daily 4)  Lisinopril 40 Mg Tabs (Lisinopril) .... Take 1 Tablet Once Daily 5)  Crestor 40 Mg Tabs (Rosuvastatin Calcium) .... Take One Tablet By Mouth Daily. 6)  Wellbutrin Xl 150 Mg Xr24h-Tab (Bupropion Hcl) .... Take 1 Tablet By Mouth Once Daily 7)  Metoprolol Succinate 50 Mg Xr24h-Tab (Metoprolol Succinate) .... Take One Tablet By Mouth Daily 8)  Clonidine Hcl 0.2 Mg Tabs (Clonidine Hcl) .... Take 1 Tablet By Mouth Two Times A Day 9)  Diclofenac Sodium 50 Mg Tbec (Diclofenac Sodium) .... Take 1 Tab Two Times A Day 10)  Welchol 625 Mg Tabs (Colesevelam Hcl) .... Take 2 Tablets By Mouth Two Times A Day 11)  Niaspan 500 Mg Cr-Tabs (Niacin (Antihyperlipidemic)) .... Take2 Tablet By Mouth Daily Titrating To 2000mg  Daily 12)  Terazosin Hcl 5 Mg Caps (Terazosin Hcl) .... Take 1mg  X2 Days Then 2mg  X 1 Wk Then 5mg  X 2 Wks The 2 Tablets Daily  Allergies (verified): No Known Drug Allergies  Visit Type:  2 week nurse visit Referring Provider:  . Primary Provider:  Dr. [name]   History of Present Illness: S:2 week nurse visit B: nurse visit on 03/12/2010, pt seen PCP, had MRI/MRA brain, carotids, A: denies c/o, bp same no change took bp once at pharmacy-simi;ar to today; BP log not returned R: PMD has pt scheduled to see Neurology and Nephrology-MRI/MRA performed  04/07/10  Hytrin 1 mg q.d. x2 days; then Hytrin 2 mg q.d. x1 weeks;   then Hytrin 5 mg q.d. x2 weeks  RN visit in 6 weeks.  [place] Bing, M.D.     Carotid Doppler  Procedure date:  03/18/2010  Findings:      IMPRESSION:    1.  Mild bilateral carotid bifurcation plaque resulting in less   than 50% diameter stenosis. The exam does not exclude plaque   ulceration or embolization.  Continued surveillance recommended.    Read By:  Deanne Coffer, D. Reuel Boom,  M.D.  MRI Brain  Procedure date:  03/18/2010  Findings:      IMPRESSION:   Acute non hemorrhagic infarct right mid brain.    Remote infarcts, small vessel disease type changes and atrophy as   noted above.    Mild paranasal sinus mucosal thickening.    MRA HEAD    Findings: Mild narrowing petrous segment left internal carotid   artery.  Mild narrowing distal vertical segment of the internal   carotid arteries bilaterally.  Narrowing and irregularity cavernous   and proximal supraclinoid segment of the left internal carotid   artery with mild bulge medially which has an appearance suggestive   of atherosclerotic type changes rather than aneurysm.  Moderate   narrowing proximal supraclinoid segment of the right internal   carotid artery.  Moderate narrowing  A1 segment left anterior cerebral artery.  Mild   narrowing distal M1 segment right middle cerebral artery.  Moderate   to marked irregularity  middle cerebral artery branch vessels.    Nonvisualization left vertebral artery.  Smooth narrowing distal   right vertebral artery.  Irregularity right PICA.  Mild narrowing   and irregularity of the basilar artery.  Nonvisualization AICAs.   Fetal type origin of the posterior cerebral arteries.  Moderate to   marked irregularity of the superior cerebellar arteries (more   notable on the left) and posterior cerebral arteries with narrowing   most notable P2-P3 segment of the left posterior cerebral artery.    IMPRESSION:   Prominent intracranial atherosclerotic type changes as noted  above.    Critical test results telephoned to Dr. Gerda Diss at the time of   interpretation on 03/18/2010 at 9:20 a.m.  .    Read By:  Fuller Canada,  M.D.

## 2010-04-22 NOTE — Assessment & Plan Note (Signed)
Summary: 1 mth bp check per checkout on 02/09/10/tg  Nurse Visit   Vital Signs:  Patient profile:   65 year old male Height:      70 inches Weight:      191 pounds O2 Sat:      98 % on Room air Temp:     97.1 degrees F oral Pulse rate:   64 / minute BP sitting:   151 / 91  (left arm)  Vitals Entered By: Teressa Lower RN (March 12, 2010 9:38 AM)  O2 Flow:  Room air  Visit Type:  1 month nurse visit Referring Provider:  . Primary Provider:  Dr. [name]   History of Present Illness: S: 1 month nurse visit B: office visit on 02/09/10, increased clonidine 0.2mg  two times a day, started welchol 625mg  two times a day, A:  pt's only complaint is double vision, drainage,blurred vision, denies headache and cp R: made pt an appt with PCP for 11:00am today to assess visual complaints.  03/11/10  Clonidine could be causing visual symptoms.  Repeat BP assessment in 2 weeks.  Home or Pharmacy BPs until then.  Paris Bing, M.D. nurse visit ordered, pt aware appt   Current Medications (verified): 1)  Aggrenox 25-200 Mg Xr12h-Cap (Aspirin-Dipyridamole) .... Take 1 Tablet By Mouth Once Daily 2)  Lasix 40 Mg Tabs (Furosemide) .Marland Kitchen.. 1 Tab Once Daily 3)  Norvasc 10 Mg Tabs (Amlodipine Besylate) .Marland Kitchen.. 1 Tab Once Daily 4)  Lisinopril 40 Mg Tabs (Lisinopril) .... Take 1 Tablet Once Daily 5)  Crestor 40 Mg Tabs (Rosuvastatin Calcium) .... Take One Tablet By Mouth Daily. 6)  Wellbutrin Xl 150 Mg Xr24h-Tab (Bupropion Hcl) .... Take 1 Tablet By Mouth Once Daily 7)  Metoprolol Succinate 50 Mg Xr24h-Tab (Metoprolol Succinate) .... Take One Tablet By Mouth Daily 8)  Clonidine Hcl 0.2 Mg Tabs (Clonidine Hcl) .... Take 1 Tablet By Mouth Two Times A Day 9)  Diclofenac Sodium 50 Mg Tbec (Diclofenac Sodium) .... Take 1 Tab Two Times A Day 10)  Welchol 625 Mg Tabs (Colesevelam Hcl) .... Take 2 Tablets By Mouth Two Times A Day  Allergies (verified): No Known Drug Allergies

## 2010-04-22 NOTE — Letter (Signed)
Summary: [place] [facility], agricultural at [employer]  [address] [place] [address]   Phone: [phone]  Fax: [phone]     March 16, 2010 MRN: [id number]   [facility] [address] [place], [place]  [address]      YOUR LAB WORK IS DUE   May 04, 2010  Please go to [facility], located across the street from [facility] on the second floor.  Hours are Monday - Friday 7am until 7:30pm         Saturday 8am until 12noon    _x_  DO NOT EAT OR DRINK AFTER MIDNIGHT EVENING PRIOR TO LABWORK

## 2010-04-29 ENCOUNTER — Emergency Department (HOSPITAL_COMMUNITY): Payer: Medicare Other

## 2010-04-29 ENCOUNTER — Inpatient Hospital Stay (HOSPITAL_COMMUNITY)
Admission: EM | Admit: 2010-04-29 | Discharge: 2010-04-30 | DRG: 194 | Disposition: A | Discharge status: Home / Self Care | Payer: Medicare Other | Attending: Internal Medicine | Admitting: Internal Medicine

## 2010-04-29 DIAGNOSIS — N189 Chronic kidney disease, unspecified: Secondary | ICD-10-CM | POA: Diagnosis present

## 2010-04-29 DIAGNOSIS — Z9119 Patient's noncompliance with other medical treatment and regimen: Secondary | ICD-10-CM

## 2010-04-29 DIAGNOSIS — J189 Pneumonia, unspecified organism: Principal | ICD-10-CM | POA: Diagnosis present

## 2010-04-29 DIAGNOSIS — I509 Heart failure, unspecified: Secondary | ICD-10-CM | POA: Diagnosis present

## 2010-04-29 DIAGNOSIS — I129 Hypertensive chronic kidney disease with stage 1 through stage 4 chronic kidney disease, or unspecified chronic kidney disease: Secondary | ICD-10-CM | POA: Diagnosis present

## 2010-04-29 DIAGNOSIS — Z91199 Patient's noncompliance with other medical treatment and regimen due to unspecified reason: Secondary | ICD-10-CM

## 2010-04-29 DIAGNOSIS — Z8673 Personal history of transient ischemic attack (TIA), and cerebral infarction without residual deficits: Secondary | ICD-10-CM

## 2010-04-29 DIAGNOSIS — I5032 Chronic diastolic (congestive) heart failure: Secondary | ICD-10-CM | POA: Diagnosis present

## 2010-04-29 LAB — BASIC METABOLIC PANEL
BUN: 11 mg/dL (ref 6–23)
CO2: 24 mEq/L (ref 19–32)
GFR calc non Af Amer: 43 mL/min — ABNORMAL LOW (ref >60)
Glucose, Bld: 89 mg/dL (ref 70–99)

## 2010-04-29 LAB — CBC
HCT: 41.3 % (ref 39.0–52.0)
Hemoglobin: 14.6 g/dL (ref 13.0–17.0)
MCH: 34.4 pg — ABNORMAL HIGH (ref 26.0–34.0)
MCHC: 35.4 g/dL (ref 30.0–36.0)
MCV: 97.4 fL (ref 78.0–100.0)

## 2010-04-30 ENCOUNTER — Inpatient Hospital Stay (HOSPITAL_COMMUNITY): Payer: Medicare Other

## 2010-04-30 ENCOUNTER — Encounter (HOSPITAL_COMMUNITY): Payer: Self-pay

## 2010-04-30 LAB — COMPREHENSIVE METABOLIC PANEL
AST: 17 U/L (ref 0–37)
Albumin: 3.5 g/dL (ref 3.5–5.2)
Alkaline Phosphatase: 57 U/L (ref 39–117)
BUN: 12 mg/dL (ref 6–23)
CO2: 22 mEq/L (ref 19–32)
Chloride: 107 mEq/L (ref 96–112)
GFR calc Af Amer: 47 mL/min — ABNORMAL LOW (ref >60)
Potassium: 3.6 mEq/L (ref 3.5–5.1)
Total Bilirubin: 1.4 mg/dL — ABNORMAL HIGH (ref 0.3–1.2)

## 2010-04-30 LAB — CBC
Hemoglobin: 14.2 g/dL (ref 13.0–17.0)
MCV: 96.8 fL (ref 78.0–100.0)
Platelets: 164 10*3/uL (ref 150–400)
RBC: 4.08 MIL/uL — ABNORMAL LOW (ref 4.22–5.81)
WBC: 6.1 10*3/uL (ref 4.0–10.5)

## 2010-04-30 LAB — LIPID PANEL: VLDL: 11 mg/dL (ref 0–40)

## 2010-04-30 LAB — DIFFERENTIAL
Eosinophils Absolute: 0.1 10*3/uL (ref 0.0–0.7)
Lymphs Abs: 1.8 10*3/uL (ref 0.7–4.0)
Neutro Abs: 3.5 10*3/uL (ref 1.7–7.7)
Neutrophils Relative %: 57 % (ref 43–77)

## 2010-05-02 NOTE — H&P (Signed)
NAMEEWART, CARRERA              ACCOUNT NO.:  [id number]  MEDICAL RECORD NO.:  [id number]           PATIENT TYPE:  I  LOCATION:  A304                          FACILITY:  [facility]  PHYSICIAN:  Wilson Singer, M.D.DATE OF BIRTH:  1945/04/02  DATE OF ADMISSION:  04/29/2010 DATE OF DISCHARGE:  LH                             HISTORY & PHYSICAL   CHIEF COMPLAINT:  Cough and dyspnea.  HISTORY OF PRESENT ILLNESS:  This very pleasant 65 year old man gives approximately 18-hour history of nonproductive cough and dyspnea on minimal exertion.  He has been feeling somewhat chilly, but has not had a documented fever.  He came to the emergency room whereupon investigations showed he had bilateral pneumonia on chest x-ray.  We are now asked for further advice on management and possible admission.  PAST MEDICAL HISTORY: 1. History of strokes/TIA, most recent one was on March 18, 2010,     when he had diplopia and he was noted to have an acute nonhemorrhagic infarct in the right mid brain. 2. History of diastolic congestive heart failure when he presented in     April 2010 with this. 3. Hypertension. 4. Abdominal aortic aneurysm status post operative repair in October     2009, by Dr. Durene Cal.  SOCIAL HISTORY:  The patient has been married for 31 years.  He unfortunately continues to smoke one pack of cigarettes per day and he also does drink alcohol vodka every day.  FAMILY HISTORY:  Noncontributory.  REVIEW OF SYSTEMS:  Apart from symptoms mentioned above, there are no other symptoms referable to all systems reviewed.  ALLERGIES:  None.  MEDICATIONS: 1. Aggrenox 1 tablet twice a day. 2. WelChol 625 mg b.i.d. 3. Lisinopril 40 mg daily,. 4. Metoprolol 50 mg daily. 5. Furosemide 40 mg daily. 6. Clonidine 0.2 mg b.i.d. 7. Bupropion XL, I believe 300 mg daily. 8. Crestor 40 mg daily. 9. Niaspan, unclear dose daily.  PHYSICAL EXAMINATION:  VITAL SIGNS:  Temperature 98.4,  blood pressure 181/111, pulse 88, saturation 94% on room air, respiratory rate 12. GENERAL:  He does not look toxic.  There is no increased work of breathing.  There is no peripheral or central cyanosis.  He does not look clinically pale.  There is no clubbing. CARDIOVASCULAR EXAMINATION:  Heart sounds are present and normal without murmurs.  Jugular venous pressure is not raised. LUNGS:  Lung fields show bronchial breathing on the left mid and right lower zones. ABDOMEN:  Soft and nontender with possibly some hepatomegaly felt. There is no splenomegaly. NEUROLOGICAL:  He appears to be alert and oriented without any focal neurologic signs. SKIN:  There are no obvious skin lesions or rashes. MUSCULOSKELETAL:  No deformities.  INVESTIGATIONS:  Chest x-ray shows bilateral airspace disease most consistent with pneumonia, this appears to be in the right middle and lower lobe as well as the lingular and left lower lobe.  Hemoglobin 14.6, white blood cell count 6.6, platelets 153.  Sodium 139, potassium 4.3, bicarbonate 24, BUN 11, creatinine 1.61.  PROBLEM LIST: 1. Bilateral community-acquired pneumonia. 2. Uncontrolled hypertension. 3. Cerebrovascular disease. 4. History of diastolic congestive heart  failure, currently     compensated. 5. History of abdominal aortic aneurysm status post repair.  PLAN: 1. Admit to regular floor. 2. Intravenous antibiotics. 3. Adjust medications to control hypertension.  Further recommendations will depend on patient's hospital progress.     Wilson Singer, M.D.     NCG/MEDQ  D:  04/29/2010  T:  04/30/2010  Job:  [id number]  cc:   Lorin Picket A. Gerda Diss, MD Fax: [phone]  Electronically Signed by Lilly Cove M.D. on 04/30/2010 11:49:12 AM

## 2010-05-02 NOTE — Discharge Summary (Signed)
  NAMEJERAN, HILTZ              ACCOUNT NO.:  [id number]  MEDICAL RECORD NO.:  [id number]           PATIENT TYPE:  I  LOCATION:  A304                          FACILITY:  [facility]  PHYSICIAN:  Wilson Singer, M.D.DATE OF BIRTH:  1945-12-04  DATE OF ADMISSION:  04/29/2010 DATE OF DISCHARGE:  02/10/2012LH                              DISCHARGE SUMMARY   FINAL DISCHARGE DIAGNOSES: 1. Bilateral community-acquired pneumonia. 2. Uncontrolled hypertension due to noncompliance. 3. Cerebrovascular disease. 4. History of diastolic congestive heart failure, currently     compensated. 5. Chronic kidney disease under investigation by Nephrology. 6. History of abdominal aortic aneurysm status post repair.  CONDITION ON DISCHARGE:  Stable.  MEDICATIONS ON DISCHARGE:  The patient is to continue with home medications, which include, 1. Aggrenox 1 tablet twice a day. 2. WelChol 625 mg b.i.d. 3. Lisinopril 40 mg daily. 4. Metoprolol 50 mg daily. 5. Furosemide 40 mg daily. 6. Clonidine 0.2 mg b.i.d. 7. Bupropion XL current dose unclear daily. 8. Crestor 40 mg daily. 9. Niaspan current dose unclear daily.  NEW MEDICATION:  Levaquin 500 mg daily for 7 days.  HISTORY:  This very pleasant 65 year old man was admitted to the hospital yesterday with nonproductive cough and dyspnea on minimal exertion.  Please see initial history and physical examination done by Dr. Lilly Cove.  HOSPITAL PROGRESS:  The patient did very well overnight and has had really no new symptoms.  His repeat chest x-ray this morning does not show any major changes and the only abnormality that has been found is somewhat degree of renal dysfunction with a creatinine of 1.78.  The wife tells me this is being investigated by Dr. Kristian Covey as an outpatient.  PHYSICAL EXAMINATION:  GENERAL:  Today, he appears well.  He is not does not look clinically pale.  There is no cyanosis.  There is no increased work of breathing.   He does not look toxic. VITAL SIGNS:  Temperature 98.7 with the highest temperature reached was 100.6, blood pressure well-controlled at 116/65, pulse 62, saturations 97% on room air. HEART:  Heart sounds are present and normal without murmurs. LUNGS:  Lung fields are entirely clear today. ABDOMEN:  Soft and nontender with no hepatosplenomegaly. NEUROLOGIC:  He is alert and orientated.  Investigations this morning show sodium of 141, potassium 3.6, bicarbonate 22, BUN 12, creatinine 1.78.  Hemoglobin 14.2, white blood cell count 6.1, platelets 164.  DISPOSITION:  The patient is clearly stable to be discharged and his blood pressure appears to be controlled today.  The wife tells me that the medication he was at home, he was not compliant with this so I am not surprised in this case.  He needs to finish a course of Levaquin for 7 days and needs to follow up with his primary care physician in approximately a week or so.     Wilson Singer, M.D.     NCG/MEDQ  D:  04/30/2010  T:  04/30/2010  Job:  [id number]  cc:   Lorin Picket A. Gerda Diss, MD Fax: [phone]  Electronically Signed by Lilly Cove M.D. on 05/01/2010 10:30:47 AM

## 2010-05-10 ENCOUNTER — Encounter (INDEPENDENT_AMBULATORY_CARE_PROVIDER_SITE_OTHER): Payer: Self-pay | Admitting: *Deleted

## 2010-05-10 LAB — CONVERTED CEMR LAB
ALT: 12 units/L (ref 0–53)
Albumin: 4.7 g/dL (ref 3.5–5.2)
CO2: 26 meq/L (ref 19–32)
Calcium: 9.5 mg/dL (ref 8.4–10.5)
Chloride: 104 meq/L (ref 96–112)
Cholesterol: 224 mg/dL — ABNORMAL HIGH (ref 0–200)
Sodium: 141 meq/L (ref 135–145)
Total Protein: 7.9 g/dL (ref 6.0–8.3)

## 2010-05-18 NOTE — Letter (Signed)
Summary: [place] [facility] Work [facility], agricultural at [employer]  [address] [place] [address]   Phone: [phone]  Fax: [phone]     May 10, 2010 MRN: [id number]   [facility] [address] [place], [place]  [address]      YOUR LAB WORK IS DUE   June 08, 2010  Please go to [facility], located across the street from [facility] on the second floor.  Hours are Monday - Friday 7am until 7:30pm         Saturday 8am until 12noon    _X_  DO NOT EAT OR DRINK AFTER MIDNIGHT EVENING PRIOR TO LABWORK

## 2010-05-19 ENCOUNTER — Encounter (INDEPENDENT_AMBULATORY_CARE_PROVIDER_SITE_OTHER): Payer: Medicare Other

## 2010-05-19 ENCOUNTER — Encounter: Payer: Self-pay | Admitting: *Deleted

## 2010-05-19 DIAGNOSIS — I10 Essential (primary) hypertension: Secondary | ICD-10-CM

## 2010-05-19 DIAGNOSIS — I2510 Atherosclerotic heart disease of native coronary artery without angina pectoris: Secondary | ICD-10-CM

## 2010-05-28 ENCOUNTER — Ambulatory Visit (HOSPITAL_COMMUNITY)
Admission: RE | Admit: 2010-05-28 | Discharge: 2010-05-28 | Disposition: A | Discharge status: Home / Self Care | Payer: Medicare Other | Source: Ambulatory Visit | Attending: Family Medicine | Admitting: Family Medicine

## 2010-05-28 ENCOUNTER — Encounter (HOSPITAL_COMMUNITY): Payer: Self-pay

## 2010-05-28 ENCOUNTER — Other Ambulatory Visit (HOSPITAL_COMMUNITY): Payer: Self-pay | Admitting: Family Medicine

## 2010-05-28 ENCOUNTER — Other Ambulatory Visit: Payer: Self-pay | Admitting: Cardiology

## 2010-05-28 DIAGNOSIS — J449 Chronic obstructive pulmonary disease, unspecified: Secondary | ICD-10-CM | POA: Insufficient documentation

## 2010-05-28 DIAGNOSIS — J189 Pneumonia, unspecified organism: Secondary | ICD-10-CM

## 2010-05-28 DIAGNOSIS — I10 Essential (primary) hypertension: Secondary | ICD-10-CM | POA: Insufficient documentation

## 2010-05-28 DIAGNOSIS — J4489 Other specified chronic obstructive pulmonary disease: Secondary | ICD-10-CM | POA: Insufficient documentation

## 2010-05-28 HISTORY — DX: Essential (primary) hypertension: I10

## 2010-05-29 LAB — LIPID PANEL
LDL Cholesterol: 148 mg/dL — ABNORMAL HIGH (ref 0–99)
Triglycerides: 72 mg/dL (ref <150)
VLDL: 14 mg/dL (ref 0–40)

## 2010-05-29 LAB — BASIC METABOLIC PANEL
BUN: 14 mg/dL (ref 6–23)
Chloride: 106 mEq/L (ref 96–112)
Glucose, Bld: 106 mg/dL — ABNORMAL HIGH (ref 70–99)
Potassium: 4.6 mEq/L (ref 3.5–5.3)

## 2010-06-01 ENCOUNTER — Encounter: Payer: Self-pay | Admitting: Adult Health

## 2010-06-01 ENCOUNTER — Ambulatory Visit (INDEPENDENT_AMBULATORY_CARE_PROVIDER_SITE_OTHER): Payer: Medicare Other | Admitting: Adult Health

## 2010-06-01 DIAGNOSIS — E782 Mixed hyperlipidemia: Secondary | ICD-10-CM

## 2010-06-01 DIAGNOSIS — I110 Hypertensive heart disease with heart failure: Secondary | ICD-10-CM

## 2010-06-01 LAB — CONVERTED CEMR LAB
Chloride: 106 meq/L (ref 96–112)
Creatinine, Ser: 1.68 mg/dL — ABNORMAL HIGH (ref 0.40–1.50)
HDL: 65 mg/dL (ref >39)
LDL Cholesterol: 148 mg/dL — ABNORMAL HIGH (ref 0–99)
Potassium: 4.6 meq/L (ref 3.5–5.3)
Triglycerides: 72 mg/dL (ref <150)

## 2010-06-08 NOTE — Assessment & Plan Note (Signed)
Summary: to discuss cholesterol meds after labwork remains elevated/tg   Visit Type:  Follow-up Referring Provider:  . Primary Provider:  Dr. [name]   History of Present Illness: [name] is a 65 y/o AAM we are following for continued assessment and treatment of hypertensive heart disease, hyperlipidemia.  He has had a recent illness with pneumonia requiring lengthy course of abx.  He has lost about 6 pounds.  He remains medically compliant, but continues to smoke.  He has had recent lab work completed for cholesterol evaluation.  TC-227, TG 72; LDL-148; HDL 65.  He is without complaint at present.  Current Medications (verified): 1)  Aggrenox 25-200 Mg Xr12h-Cap (Aspirin-Dipyridamole) .... Take 1 Tablet By Mouth Once Daily 2)  Furosemide 20 Mg Tabs (Furosemide) .... Take One Tablet By Mouth Daily. 3)  Norvasc 10 Mg Tabs (Amlodipine Besylate) .Marland Kitchen.. 1 Tab Once Daily 4)  Lisinopril 20 Mg Tabs (Lisinopril) .... Take One Tablet By Mouth Daily 5)  Crestor 40 Mg Tabs (Rosuvastatin Calcium) .... Take One Tablet By Mouth Daily. 6)  Wellbutrin Xl 150 Mg Xr24h-Tab (Bupropion Hcl) .... Take 1 Tablet By Mouth Once Daily 7)  Metoprolol Succinate 50 Mg Xr24h-Tab (Metoprolol Succinate) .... Take One Tablet By Mouth Daily 8)  Clonidine Hcl 0.1 Mg Tabs (Clonidine Hcl) .... Take2  Tablet S By Mouth Twice A Day 9)  Diclofenac Sodium 50 Mg Tbec (Diclofenac Sodium) .... Take 1 Tab Two Times A Day 10)  Welchol 625 Mg Tabs (Colesevelam Hcl) .... Take 2 Tablets By Mouth Two Times A Day 11)  Niaspan 500 Mg Cr-Tabs (Niacin (Antihyperlipidemic)) .... Take2 Tablet By Mouth Daily Titrating To 2000mg  Daily 12)  Terazosin Hcl 10 Mg Caps (Terazosin Hcl) .... Take 1 Tablet By Mouth Once A Day 13)  Zetia 10 Mg Tabs (Ezetimibe) .... Take 1 Tablet By Mouth Once A Day 14)  Folic Acid 1 Mg Tabs (Folic Acid) .... Take 1 Tablet By Mouth Once A Day  Allergies (verified): No Known Drug Allergies  Review of Systems       All other systems have been reviewed and are negative unless stated above.   Vital Signs:  Patient profile:   65 year old male Weight:      185 pounds BMI:     26.64 Pulse rate:   62 / minute BP sitting:   157 / 95  (left arm)  Vitals Entered By: Dreama Saa, CNA (June 01, 2010 11:52 AM)  Physical Exam  General:  Well developed, well nourished, in no acute distress. Lungs:  Clear bilaterally to auscultation and percussion. Heart:  Non-displaced PMI, chest non-tender; regular rate and rhythm, S1, S2 with 1/6 systolic  murmurs, rubs or gallops. Carotid upstroke normal, no bruit. Normal abdominal aortic size, no bruits. Femorals normal pulses, no bruits. Pedals normal pulses. No edema, no varicosities. Abdomen:  Bowel sounds positive; abdomen soft and non-tender without masses, organomegaly, or hernias noted. No hepatosplenomegaly. Msk:  Back normal, normal gait. Muscle strength and tone normal. Pulses:  pulses normal in all 4 extremities Extremities:  No clubbing or cyanosis. Neurologic:  Alert and oriented x 3. Psych:  Normal affect.   Impression & Recommendations:  Problem # 1:  HYPERTENSIVE HEART DISEASE; H/O CHF (ICD-402.11) I have rechecked his BP in the exam room and it is lower than intial check.  Now 140/72.  He unfortunately continues to smoke. I have advised him to try again to quit.  He does not wish to do so at  this time.  He will continue on current medications as directed. Will see him in 6 months unless he becomes symptomatic. His updated medication list for this problem includes:    Furosemide 20 Mg Tabs (Furosemide) .Marland Kitchen... Take one tablet by mouth daily.    Norvasc 10 Mg Tabs (Amlodipine besylate) .Marland Kitchen... 1 tab once daily    Lisinopril 20 Mg Tabs (Lisinopril) .Marland Kitchen... Take one tablet by mouth daily    Metoprolol Succinate 50 Mg Xr24h-tab (Metoprolol succinate) .Marland Kitchen... Take one tablet by mouth daily    Clonidine Hcl 0.1 Mg Tabs (Clonidine hcl) .Marland Kitchen... Take2  tablet s by  mouth twice a day    Terazosin Hcl 10 Mg Caps (Terazosin hcl) .Marland Kitchen... Take 1 tablet by mouth once a day  Problem # 2:  HYPERLIPIDEMIA (ICD-272.2) He is maximized on Crestor, and meds listed above.  He is advised to begin taking Fish Oil over the counter daily and to be on a low cholesterol diet.  Smoking cessation would be preferred as well.  His updated medication list for this problem includes:    Crestor 40 Mg Tabs (Rosuvastatin calcium) .Marland Kitchen... Take one tablet by mouth daily.    Welchol 625 Mg Tabs (Colesevelam hcl) .Marland Kitchen... Take 2 tablets by mouth two times a day    Niaspan 500 Mg Cr-tabs (Niacin (antihyperlipidemic)) .Marland Kitchen... Take2 tablet by mouth daily titrating to 2000mg  daily    Zetia 10 Mg Tabs (Ezetimibe) .Marland Kitchen... Take 1 tablet by mouth once a day  Patient Instructions: 1)  Your physician recommends that you schedule a follow-up appointment in: 6 months 2)  Your physician recommends that you continue on your current medications as directed. Please refer to the Current Medication list given to you today.

## 2010-06-08 NOTE — Assessment & Plan Note (Signed)
Summary: nurse visit per [name]/rm  Nurse Visit   Vital Signs:  Patient profile:   65 year old male Height:      70 inches Weight:      198 pounds BMI:     28.51 O2 Sat:      96 % on Room air Pulse rate:   71 / minute BP sitting:   160 / 77  (left arm)  Vitals Entered By: Teressa Lower RN (May 19, 2010 3:53 PM)  Nutrition Counseling: Patient's BMI is greater than 25 and therefore counseled on weight management options.  O2 Flow:  Room air  Current Medications (verified): 1)  Aggrenox 25-200 Mg Xr12h-Cap (Aspirin-Dipyridamole) .... Take 1 Tablet By Mouth Once Daily 2)  Furosemide 20 Mg Tabs (Furosemide) .... Take One Tablet By Mouth Daily. 3)  Norvasc 10 Mg Tabs (Amlodipine Besylate) .Marland Kitchen.. 1 Tab Once Daily 4)  Lisinopril 20 Mg Tabs (Lisinopril) .... Take One Tablet By Mouth Daily 5)  Crestor 40 Mg Tabs (Rosuvastatin Calcium) .... Take One Tablet By Mouth Daily. 6)  Wellbutrin Xl 150 Mg Xr24h-Tab (Bupropion Hcl) .... Take 1 Tablet By Mouth Once Daily 7)  Metoprolol Succinate 50 Mg Xr24h-Tab (Metoprolol Succinate) .... Take One Tablet By Mouth Daily 8)  Clonidine Hcl 0.1 Mg Tabs (Clonidine Hcl) .... Take2  Tablet S By Mouth Twice A Day 9)  Diclofenac Sodium 50 Mg Tbec (Diclofenac Sodium) .... Take 1 Tab Two Times A Day 10)  Welchol 625 Mg Tabs (Colesevelam Hcl) .... Take 2 Tablets By Mouth Two Times A Day 11)  Niaspan 500 Mg Cr-Tabs (Niacin (Antihyperlipidemic)) .... Take2 Tablet By Mouth Daily Titrating To 2000mg  Daily 12)  Terazosin Hcl 10 Mg Caps (Terazosin Hcl) .... Take 1 Tablet By Mouth Once A Day 13)  Zetia 10 Mg Tabs (Ezetimibe) .... Take 1 Tablet By Mouth Once A Day 14)  Folic Acid 1 Mg Tabs (Folic Acid) .... Take 1 Tablet By Mouth Once A Day  Allergies (verified): No Known Drug Allergies  Visit Type:  6 wk nurse visit Referring Provider:  . Primary Provider:  Dr. [name]   History of Present Illness: S: 6 week nurse visit B: last nurse visit 03/26/10,  recent hosp for pneumonia A: pt c/o shortness of breath, no other c/o R:   05/29/2010  Change next appointment, initially scheduled for 07/2010 to first available.  [place] Bing, M.D. I spoke with pt, discussed dyslipidemia, pt to be seen on 06/01/2010 by [facility]  Prescriptions: METOPROLOL SUCCINATE 50 MG XR24H-TAB (METOPROLOL SUCCINATE) Take one tablet by mouth daily  #30 x 3   Entered by:   Teressa Lower RN   Authorized by:   Kathlen Brunswick, MD, [facility]   Signed by:   Teressa Lower RN on 05/19/2010   Method used:   Electronically to        [facility] [phone]* (retail)       [address]       [place], [place]  [address]       Ph: [phone]       Fax: [phone]   RxID:   [id number] NORVASC 10 MG TABS (AMLODIPINE BESYLATE) 1 tab once daily  #30 x 3   Entered by:   Teressa Lower RN   Authorized by:   Kathlen Brunswick, MD, [facility]   Signed by:   Teressa Lower RN on 05/19/2010   Method used:   [facility]  [address] [phone]* (retail)       [address]       [place], [place]  [address]       Ph: [phone]       Fax: [phone]   RxID:   [id number]

## 2010-06-30 LAB — BASIC METABOLIC PANEL
BUN: 16 mg/dL (ref 6–23)
BUN: 19 mg/dL (ref 6–23)
CO2: 26 mEq/L (ref 19–32)
CO2: 23 mEq/L (ref 19–32)
Calcium: 9.4 mg/dL (ref 8.4–10.5)
Creatinine, Ser: 1.06 mg/dL (ref 0.4–1.5)
GFR calc Af Amer: >60 mL/min (ref >60)
Glucose, Bld: 160 mg/dL — ABNORMAL HIGH (ref 70–99)
Potassium: 4.3 mEq/L (ref 3.5–5.1)
Sodium: 137 mEq/L (ref 135–145)

## 2010-06-30 LAB — POCT CARDIAC MARKERS
CKMB, poc: 1.7 ng/mL (ref 1.0–8.0)
CKMB, poc: 6.8 ng/mL (ref 1.0–8.0)
Myoglobin, poc: 96.4 ng/mL (ref 12–200)
Myoglobin, poc: >500 ng/mL (ref 12–200)
Troponin i, poc: <0.05 ng/mL (ref 0.00–0.09)

## 2010-06-30 LAB — COMPREHENSIVE METABOLIC PANEL
ALT: 15 U/L (ref 0–53)
AST: 32 U/L (ref 0–37)
Albumin: 4 g/dL (ref 3.5–5.2)
Alkaline Phosphatase: 94 U/L (ref 39–117)
CO2: 26 mEq/L (ref 19–32)
Chloride: 106 mEq/L (ref 96–112)
GFR calc Af Amer: >60 mL/min (ref >60)
GFR calc non Af Amer: 57 mL/min — ABNORMAL LOW (ref >60)
Potassium: 3.6 mEq/L (ref 3.5–5.1)
Sodium: 140 mEq/L (ref 135–145)
Total Bilirubin: 1.5 mg/dL — ABNORMAL HIGH (ref 0.3–1.2)

## 2010-06-30 LAB — URINE CULTURE
Colony Count: NO GROWTH
Culture: NO GROWTH

## 2010-06-30 LAB — BLOOD GAS, ARTERIAL
Acid-base deficit: 6.2 mmol/L — ABNORMAL HIGH (ref 0.0–2.0)
Bicarbonate: 19.9 mEq/L — ABNORMAL LOW (ref 20.0–24.0)
O2 Content: 4 L/min
O2 Saturation: 84.2 %
pCO2 arterial: 48 mmHg — ABNORMAL HIGH (ref 35.0–45.0)
pO2, Arterial: 58.4 mmHg — ABNORMAL LOW (ref 80.0–100.0)

## 2010-06-30 LAB — DIFFERENTIAL
Basophils Absolute: 0 10*3/uL (ref 0.0–0.1)
Eosinophils Absolute: 0.3 10*3/uL (ref 0.0–0.7)
Eosinophils Relative: 2 % (ref 0–5)
Lymphocytes Relative: 22 % (ref 12–46)
Monocytes Absolute: 0.7 10*3/uL (ref 0.1–1.0)

## 2010-06-30 LAB — URINE MICROSCOPIC-ADD ON

## 2010-06-30 LAB — URINALYSIS, ROUTINE W REFLEX MICROSCOPIC
Ketones, ur: NEGATIVE mg/dL
Protein, ur: >300 mg/dL — AB
Urobilinogen, UA: 0.2 mg/dL (ref 0.0–1.0)

## 2010-06-30 LAB — CBC
MCV: 102.8 fL — ABNORMAL HIGH (ref 78.0–100.0)
Platelets: 154 10*3/uL (ref 150–400)
RBC: 4.87 MIL/uL (ref 4.22–5.81)
WBC: 12.6 10*3/uL — ABNORMAL HIGH (ref 4.0–10.5)

## 2010-06-30 LAB — LIPID PANEL
Cholesterol: 275 mg/dL — ABNORMAL HIGH (ref 0–200)
LDL Cholesterol: 203 mg/dL — ABNORMAL HIGH (ref 0–99)
Total CHOL/HDL Ratio: 5.2 RATIO
VLDL: 19 mg/dL (ref 0–40)

## 2010-06-30 LAB — PROTIME-INR: Prothrombin Time: 13.7 seconds (ref 11.6–15.2)

## 2010-06-30 LAB — CARDIAC PANEL(CRET KIN+CKTOT+MB+TROPI)
CK, MB: 4.2 ng/mL — ABNORMAL HIGH (ref 0.3–4.0)
Relative Index: 2.8 — ABNORMAL HIGH (ref 0.0–2.5)

## 2010-06-30 LAB — BRAIN NATRIURETIC PEPTIDE: Pro B Natriuretic peptide (BNP): 490 pg/mL — ABNORMAL HIGH (ref 0.0–100.0)

## 2010-07-07 ENCOUNTER — Emergency Department (HOSPITAL_COMMUNITY): Payer: Medicare Other

## 2010-07-07 ENCOUNTER — Emergency Department (HOSPITAL_COMMUNITY)
Admission: EM | Admit: 2010-07-07 | Discharge: 2010-07-07 | Disposition: A | Discharge status: Home / Self Care | Payer: Medicare Other | Attending: Emergency Medicine | Admitting: Emergency Medicine

## 2010-07-07 DIAGNOSIS — N289 Disorder of kidney and ureter, unspecified: Secondary | ICD-10-CM | POA: Insufficient documentation

## 2010-07-07 DIAGNOSIS — R0602 Shortness of breath: Secondary | ICD-10-CM | POA: Insufficient documentation

## 2010-07-07 DIAGNOSIS — I509 Heart failure, unspecified: Secondary | ICD-10-CM | POA: Insufficient documentation

## 2010-07-07 DIAGNOSIS — Z79899 Other long term (current) drug therapy: Secondary | ICD-10-CM | POA: Insufficient documentation

## 2010-07-07 DIAGNOSIS — Z8673 Personal history of transient ischemic attack (TIA), and cerebral infarction without residual deficits: Secondary | ICD-10-CM | POA: Insufficient documentation

## 2010-07-07 DIAGNOSIS — I10 Essential (primary) hypertension: Secondary | ICD-10-CM | POA: Insufficient documentation

## 2010-07-07 LAB — DIFFERENTIAL
Basophils Relative: 0 % (ref 0–1)
Eosinophils Absolute: 0.2 10*3/uL (ref 0.0–0.7)
Eosinophils Relative: 2 % (ref 0–5)
Monocytes Absolute: 0.9 10*3/uL (ref 0.1–1.0)
Monocytes Relative: 11 % (ref 3–12)
Neutro Abs: 4.7 10*3/uL (ref 1.7–7.7)

## 2010-07-07 LAB — CBC
Hemoglobin: 16.6 g/dL (ref 13.0–17.0)
MCH: 36.2 pg — ABNORMAL HIGH (ref 26.0–34.0)
MCHC: 35.5 g/dL (ref 30.0–36.0)
RDW: 14.2 % (ref 11.5–15.5)

## 2010-07-07 LAB — POCT CARDIAC MARKERS
CKMB, poc: <1 ng/mL — ABNORMAL LOW (ref 1.0–8.0)
Myoglobin, poc: 119 ng/mL (ref 12–200)

## 2010-07-07 LAB — BASIC METABOLIC PANEL
CO2: 20 mEq/L (ref 19–32)
Calcium: 8.9 mg/dL (ref 8.4–10.5)
Creatinine, Ser: 1.84 mg/dL — ABNORMAL HIGH (ref 0.4–1.5)
GFR calc Af Amer: 45 mL/min — ABNORMAL LOW (ref >60)
GFR calc non Af Amer: 37 mL/min — ABNORMAL LOW (ref >60)

## 2010-08-03 NOTE — Assessment & Plan Note (Signed)
[facility]                       [place] CARDIOLOGY OFFICE NOTE   NAME:[name], [name]                     MRN:          [id number]  DATE:07/21/2008                            DOB:          04-07-1945    CARDIOLOGIST:  Dr. [place] Bing.   PRIMARY CARE PHYSICIAN:  Dr. [name].   REASON FOR VISIT:  Post-hospitalization followup.   HISTORY OF PRESENT ILLNESS:  Mr. Brill is a 65 year old male patient  with history of hypertension, peripheral arterial disease status post  stent graft repair of an abdominal aortic aneurysm in 2009, and  cerebrovascular disease with 1% to 39% bilateral ICA plaquing with a  prior history of stroke who was recently seen at [facility]  with the development of new onset pulmonary edema.  His echocardiogram  demonstrated good LV function with an EF of 60%.  No wall motion  abnormalities.  It was felt that he had diastolic heart failure probably  in the setting of hypertensive heart disease.  The patient was counseled  on possibly proceeding with cardiac catheterization.  However, he  preferred to proceed with Myoview study initially.  His medications were  adjusted for blood pressure management.  He was set up for an outpatient  stress test that was performed on July 07, 2008.  He achieved 85% of  his age-predicted maximal heart rate.  His images demonstrated an EF of  34% with diaphragmatic attenuation versus minimal scarring in the  basilar and mid inferior wall but no evidence of myocardial ischemia.  He returns for followup today.  Overall, he is doing well.  Denies chest  pain, shortness breath.  Denies syncope or near syncope.  Denies  orthopnea, PND, or pedal edema.  Unfortunately, he continues to smoke  cigarettes and drink alcohol.  When asked, he says he drinks too much.  Of note, his lipids were checked in the hospital and were suboptimal  with a total cholesterol of 275, triglycerides 96,  HDL 53 and an LDL  203.   CURRENT MEDICATIONS:  1. Aggrenox 1 tablet daily.  2. Pravastatin 80 mg q.h.s.  3. Zestril 20 mg daily.  4. Lasix 40 mg daily.  5. Norvasc 10 mg daily.  6. Chantix as directed.   ALLERGIES:  NO KNOWN DRUG ALLERGIES.   PHYSICAL EXAM:  He is a well-nourished, well-developed male.  VITAL SIGNS:  Blood pressure 112/82.  Pulse 76.  Weight 191 pounds.  HEENT:  Normal.  NECK:  Without JVD.  CARDIAC:  Normal S1 and S2.  Regular rate and rhythm.  LUNGS:  Clear to  auscultation bilaterally.  ABDOMEN:  Soft, nontender.  EXTREMITIES:  Without edema.  NEUROLOGIC:  He is alert and oriented x3.  Cranial nerves II-XII are  grossly intact.  SKIN:  Warm and dry.   ASSESSMENT AND PLAN:  1. Chronic diastolic congestive heart failure likely secondary to      hypertensive heart disease.  He presented to the hospital with      acute pulmonary edema and his echocardiogram demonstrated normal LV      function.  His recent nuclear study  demonstrates an EF of 34%.  Dr.      Dietrich Pates reviewed his echocardiogram and it appears that his LV      function was normal by that study. We plan to proceed with a MUGA      scan to further define his LV function.  2. Hypertension.  This is well controlled on his current therapy.  We      will check a BMET today to follow up on his renal function and      potassium given his ongoing Lasix and ACE inhibitor therapy.  3. Dyslipidemia.  This is uncontrolled.  His goal LDL is less than 70.      The patient has been asked to follow up with Dr. Gerda Diss.  He likely      will need to change from pravastatin to something like Lipitor or      Crestor.  I will leave this up to his primary care physician.  4. Tobacco abuse.  He has been asked to discontinue this.  5. Alcohol abuse.  He has been asked to decrease this is as much as      possible.   DISPOSITION:  The patient will be brought back in 3 months or sooner for  followup.      Tereso Newcomer, PA-C  Electronically Signed      Gerrit Friends. Dietrich Pates, MD, [facility]  Electronically Signed   SW/MedQ  DD: 07/21/2008  DT: 07/21/2008  Job #: [id number]   cc:   Lorin Picket A. Gerda Diss, MD

## 2010-08-03 NOTE — Assessment & Plan Note (Signed)
[facility]                       [place] CARDIOLOGY OFFICE NOTE   NAME:[name], Greg A                     MRN:          [id number]  DATE:07/21/2008                            DOB:          [birth year]    ADDENDUM   After further review with Dr. Dietrich Pates, we have decided to set the  patient up for a MUGA scan to further assess his LV function.  He will  follow up as previously scheduled in 3 months or sooner p.r.n.     Tereso Newcomer, PA-C     SW/MedQ  DD: 07/21/2008  DT: 07/22/2008  Job #: [id number]

## 2010-08-03 NOTE — Discharge Summary (Signed)
[name], [name]              ACCOUNT NO.:  [id number]   MEDICAL RECORD NO.:  [id number]          PATIENT TYPE:  INP   LOCATION:  3314                         FACILITY:  [facility]   PHYSICIAN:  Coral Else          DATE OF BIRTH:  Jul 30, 1945   DATE OF ADMISSION:  01/16/2008  DATE OF DISCHARGE:                               DISCHARGE SUMMARY   DISCHARGE DIAGNOSES:  1. Abdominal aortic aneurysm.  2. Hypertension.  3. History of stroke.  4. Dyslipidemia.   PROCEDURE PERFORMED:  Endovascular repair of abdominal aortic aneurysm  using a Cook endovascular repair system.   COMPLICATIONS:  None.   CONDITION AT DISCHARGE:  Stable, improving.   DISCHARGE MEDICATIONS:  1. Aggrenox 25/200 p.o. b.i.d.  2. Pravastatin 80 mg p.o. daily.  3. Doxazosin 4 mg p.o. daily.  4. Sular 34 mg p.o. daily.  5. He is given a prescription for Percocet 325 one p.o. q.4 h. p.r.n.      pain, total #20 were given.   DISCHARGE DISPOSITION:  He is being discharged home in stable condition  with his wounds healing well.  He is given careful instructions  regarding his activity levels in the care of his wounds.  He is to see  Dr. Myra Gianotti in 4 weeks with CTA of the abdomen and pelvis with ABIs.  The office will arrange a visit.   BRIEF IDENTIFYING STATEMENT:  For complete details, please refer the  typed history and physical.  Briefly, this very pleasant 65 year old  gentleman was referred to Dr. Myra Gianotti with an aortic abdominal aneurysm.  Dr. Myra Gianotti evaluated him and found him to be a suitable candidate for  endovascular repair.  He was informed of the risks and benefits of the  procedure and after careful consideration, elected to proceed with  surgery.   HOSPITAL COURSE:  Preoperative workup was completed as an outpatient.  He was brought in through same-day surgeon and underwent the  aforementioned endovascular repair of his abdominal aortic aneurysm.  For complete details, please refer the typed  operative report.  The  procedure was without complications.  He was returned to the  post anesthesia care unit and extubated.  Following stabilization, he  was transferred to a bed on a surgical step-down unit.  He was observed  overnight.  He remained stable.  He was eating well and walking prior to  discharge.  He was felt to be stable and was discharged home in stable  condition.      Wilmon Arms, PA    ______________________________  Coral Else    KEL/MEDQ  D:  01/17/2008  T:  01/17/2008  Job:  [id number]

## 2010-08-03 NOTE — Op Note (Signed)
[name], [name]              ACCOUNT NO.:  [id number]   MEDICAL RECORD NO.:  [id number]          PATIENT TYPE:  INP   LOCATION:  3314                         FACILITY:  [facility]   PHYSICIAN:  Juleen China IV, MDDATE OF BIRTH:  [birth year]   DATE OF PROCEDURE:  01/16/2008  DATE OF DISCHARGE:                               OPERATIVE REPORT   PREOPERATIVE DIAGNOSIS:  Abdominal aortic aneurysm.   POSTOPERATIVE DIAGNOSIS:  Abdominal aortic aneurysm.   PROCEDURES PERFORMED:  1. Endovascular repair of abdominal aortic aneurysm.  2. Bilateral common femoral artery exposure.  3. Catheter in aorta x2.  4. Abdominal aortogram.   SURGEON:  1. Charlena Cross, MD   ASSISTANT:  Wilmon Arms, PA   ANESTHESIA:  General.   FINDINGS:  Complete exclusion.   BLOOD LOSS:  200 mL.   SPECIMENS:  None.   INDICATIONS:  This is a 65 year old gentleman who I initially met for a  foot ulcer which ultimately healed.  He had a known abdominal aortic  aneurysm, which is now being excised for repair.  Risks and benefits  were discussed.  The patient's informed consent was signed.   PROCEDURE:  The patient was identified in the holding area and taken to  room 9.  He is placed supine on the table.  General endotracheal  anesthesia was administered.  The patient was prepped and draped in  standard sterile fashion.  A time-out was called and antibiotics were  given.  The inguinal ligament was identified by bony landmarks.  Oblique  incisions were made bilaterally below the inguinal ligament anterior to  the palpable pulse of the common femoral artery.  Cautery was used to  dissect the subcutaneous tissue.  Veenema retractor was used for  exposure.  Femoral sheath was identified and opened sharply.  Common  femoral artery was mobilized below the inguinal ligament down to the  bifurcation.  It was encircled with Vessiloops.  Side branches were also  encircled with Vessiloops.  Next, the patient  was given systemic  heparinization.  An 18-gauge needle was used to provide assess into  bilateral common bilateral arteries.  An 0.035 wires were advanced into  the aorta under fluoroscopic visualization.  An 8-French sheath was  replaced bilaterally.  An Omni flush catheter was placed at the level of  L1 and abdominal aortogram was obtained to identify the location of the  renal arteries.  I selected to place the main body of the left side.  Over a Lunderquist wire, the main body was advanced into the abdominal  aorta.  The main body was a Cook Zenith 28 x 96 device.  It was  positioned just below the renal arteries and the proximal 2 stents were  released.  An additional arteriogram was performed to confirm that the  graft was in good position.  Next, the contralateral gate was exposed.  Top cap was then released deploying suprarenal stent.  Contralateral  gate was then cannulated using a Bentson wire and a Kumpe catheter.  An  Omni flush catheter was able to be freely rotated within the main  body  device confirming successful cannulation of the gate.  Next, the image  intensifier was rotated to an LAO oblique.  A retrograde femoral  angiogram was performed to show the location of the right hypogastric  artery.  Over an Amplatz superstiff wire, the right leg was advanced and  then deployed landing short at the right hypogastric artery.  The right  limb was a Cook Zenith 22 x 56 device.  Next, the remaining portion of  the main body of the ipsilateral side was deployed.  The top cap was  retrieved.  The main body was then removed and an 18-French sheath was  placed.  The retrograde femoral angiogram and a RAO oblique position was  performed.  This showed the location of the left hypogastric artery.  A  Cook Zenith 16 x 56 device was then advanced through an 18-French  sheath.  It was deployed landing at the level of the left hypogastric  artery.  Next, Coda balloon was used to mold the  limbs of the graft as  well as device overlap.  The proximal device was not molded with the  Coda balloon.  Final contrast injection was performed.  This shows  complete exclusion of the aneurysm.  Both hypogastric arteries remain  patent.  There were no evidence of endoleak.   At this point in time, sheaths and wires were removed.  Groins were  closed using a running 6-0 Prolene.  The femoral sheaths were  reapproximated with 2-0 Vicryl.  The subcutaneous tissue was closed with  3-0 Vicryl.  A 4-0 Vicryl was used to close the skin.  Dermabond was  placed.  The patient was successfully extubated and taken to recovery  room in stable condition.           ______________________________  V. Charlena Cross, MD  Electronically Signed     VWB/MEDQ  D:  01/16/2008  T:  01/17/2008  Job:  [id number]

## 2010-08-03 NOTE — Procedures (Signed)
ENDOVASCULAR STENT GRAFT EXAM   INDICATION:  Followup with endostent.   HISTORY:  Abdominal aortic aneurysm.                           DUPLEX EVALUATION   AAA Sac Size:                 3.6 CM AP             3.03 CM TRV  Previous Sac Size:            CT 5.5 CM AP          4.5 CM TRV  Evidence of an endoleak?      No                    No   Velocity Criteria:  Proximal Aorta                47 cm/sec  Proximal Stent Graft          49 cm/sec  Main Body Stent Graft-Mid     168 cm/sec  Right Limb-Proximal           136 cm/sec  Right Limb-Distal             173 cm/sec  Left Limb-Proximal            139 cm/sec  Left Limb-Distal              161 cm/sec  Patent Renal Arteries?        Yes                   Yes   IMPRESSION:  1. Patent endostent with no evidence of leaks.  2. Patent endostent with no evidence of stenosis.   ___________________________________________  V. Charlena Cross, MD   CJ/MEDQ  D:  03/16/2009  T:  03/17/2009  Job:  [id number]

## 2010-08-03 NOTE — Assessment & Plan Note (Signed)
OFFICE VISIT   RAHEEN, CAPILI A  DOB:  [birth year]                                       09/14/2009  EAVWU#:[id number]   The patient is status post endovascular aneurysm repair on 01/16/2008  using a Cook Zenith device.  His aneurysm has decreased appropriately in  size.  His most recent ultrasound was in December.  His aneurysm had  gone from 5.5 down to 3.6.  He denies having any abdominal pain.  Overall he is doing very well.   PHYSICAL EXAMINATION:  Heart rate is 68, blood pressure 195/102,  temperature is 97.9.  General:  He is well-appearing, in no distress.  Cardiovascular:  Regular rate and rhythm, respirations nonlabored.  Abdomen:  Soft, nontender.   ASSESSMENT/PLAN:  Status post endovascular aneurysm repair.   PLAN:  I will schedule the patient to come back for a repeat ultrasound  in 6 months.  If that ultrasound continues to be normal, we can get a  yearly follow-up.  All of his questions were answered today.     Jorge Ny, MD  Electronically Signed   VWB/MEDQ  D:  09/14/2009  T:  09/15/2009  Job:  [id number]   cc:   Stacie Glaze, MD  Scott A. Gerda Diss, MD

## 2010-08-03 NOTE — Assessment & Plan Note (Signed)
OFFICE VISIT   MADS, BORGMEYER A  DOB:  [birth year]                                       12/31/2007  ZOXWR#:[id number]   REASON FOR VISIT:  Aneurysm.   HISTORY:  This is a 65 year old gentleman I initially saw for a right  leg ulcer which has subsequently healed.  The day of his visit he had an  ultrasound which revealed a 4 cm aneurysm.  I sent him back for CT scan  and further followup to evaluate this.  He comes in today for further  discussion.  He is asymptomatic.   REVIEW OF SYSTEMS:  Review of systems is essentially negative.   PAST MEDICAL HISTORY:  Is positive for hypertension, history of stroke,  hypercholesterolemia.   FAMILY HISTORY:  Is positive for coronary artery disease.   SOCIAL HISTORY:  Married with three children.  Smokes a pack and a half  a day, drinks approximately 5-7 drinks per day, mostly rum.   MEDICATIONS:  Include Aggrenox, doxazosin, nisoldipine, Silvadene,  pravastatin.   ALLERGIES:  Are none.   PHYSICAL EXAMINATION:  Vital signs:  Blood pressure is 208/89.  General:  He is well-appearing and in no distress.  HEENT:  Normocephalic,  atraumatic.  Pupils equal, sclerae anicteric.  Cardiovascular:  Regular  rate and rhythm.  Abdomen:  Soft, nontender.  Extremities:  Warm and  well-perfused.  Neurological:  Cranial nerves II-XII are grossly intact.   CT scan:  I reviewed the CT scan and it reveals a 5.4 x 4.7 cm  aneurysm..   ASSESSMENT AND PLAN:  Asymptomatic 5.4 cm abdominal aneurysm.   I had a long conversation today with the patient regarding the risks and  benefits of management.  We discussed open versus endovascular repair.  We discussed all the pros and cons of each.  We elected to proceed with  endovascular aneurysm repair.  I discussed the risk of kidney trouble  and GI problems and lower extremity complications as well as we also  discussed the risk of death and need for continued followup with  endovascular repair.  I have scheduled him to undergo endovascular  aneurysm repair on Wednesday, October 28.  He will need to stop his  Aggrenox 1 week prior.   Jorge Ny, MD  Electronically Signed   VWB/MEDQ  D:  12/31/2007  T:  01/01/2008  Job:  [id number]   cc:   Stacie Glaze, MD  Scott A. Gerda Diss, MD

## 2010-08-03 NOTE — Assessment & Plan Note (Signed)
OFFICE VISIT   [name], TOSI A  DOB:  January 01, 1946                                       03/29/2010  ZOXWR#:[id number]   The patient comes back in today for followup of his aneurysm repair.   HISTORY:  This is a 65 year old gentleman who underwent endovascular  repair of his abdominal aortic aneurysm on 01/16/2008 using a Cook  Zenith device.  The patient has had no complications since his  operation.  He denies having any abdominal pain.   PHYSICAL EXAM:  Vital signs:  Heart rate 58, blood pressure 170/99, O2  sats 99%, respiratory rate 12.  General:  He is well-appearing, in no  distress.  Lungs:  Respirations are nonlabored.  Abdomen:  Soft,  nontender.  He has palpable femoral pulses.  Inguinal incisions are well-  healed.  Extremities:  Warm and well-perfused without ulceration.   DIAGNOSTIC STUDIES:  Ultrasound was ordered and performed today.  It  shows an aneurysm sac which now measures 3.6 x 3.0.   ASSESSMENT:  Status post endovascular aneurysm repair.   PLAN:  The patient is doing very well at this time.  We will continue  with a yearly ultrasound evaluation.  I have told him that he will be  followed in our PA clinic.  From here forward I would recommend getting  ultrasound once a year with a brief discussion with the PA who was  available on the ultrasound findings.  In the event that his aneurysm  starts getting bigger he will need to have a CT scan ordered and follow  up with me.   The patient's most recent carotid ultrasound shows minimal carotid  disease.  We could consider repeating those in several years.  His ABIs  showed mild to moderate disease.  He is asymptomatic.  So we will repeat  those only on an as-needed basis.     Jorge Ny, MD  Electronically Signed   VWB/MEDQ  D:  03/29/2010  T:  03/30/2010  Job:  [id number]   cc:   Lorin Picket A. Gerda Diss, MD  Stacie Glaze, MD

## 2010-08-03 NOTE — Assessment & Plan Note (Signed)
OFFICE VISIT   [name], [name]  DOB:  03-06-1946                                       03/03/2008  FAOZH#:[id number]   REASON FOR VISIT:  Followup aneurysm.   HISTORY:  This is [name] 65 year old gentleman who is status post  endovascular aneurysm repair on 01/16/2008 for [name] 5.4 cm aneurysm.  The  patient's postoperative has been complicated by trouble with appetite  and abdominal pain.  The patient states that in the past 2 weeks this  has resolved.  He is now gaining weight and eating, and is back to  having normal bowel movements.   EXAMINATION:  His incisions are well healed.  His extremities are warm  and well perfused.  There is no ulceration.  His abdomen is soft.   DIAGNOSTIC STUDIES:  The patient had [name] CT scan approximately 2 weeks  ago.  This was negative for endoleak.  His stent graft is in good  position.   I plan on having the patient come back to see me in 6 months with [name]  repeat CT angiogram.  Should he have any issues in the interim, he will  contact me.   Jorge Ny, MD  Electronically Signed   VWB/MEDQ  D:  03/03/2008  T:  03/04/2008  Job:  [id number]   cc:   Stacie Glaze, MD  Scott [name]. Gerda Diss, MD

## 2010-08-03 NOTE — Procedures (Signed)
DUPLEX ULTRASOUND OF ABDOMINAL AORTA   INDICATION:  Follow-up abdominal aortic aneurysm.   HISTORY:  Diabetes:  No.  Cardiac:  No.  Hypertension:  Yes.  Smoking:  Yes.  Connective Tissue Disorder:  Family History:  No.  Previous Surgery:  No.   DUPLEX EXAM:         AP (cm)                   TRANSVERSE (cm)  Proximal             2.53 Cm                   2.69 cm  Mid                  1.91 cm                   1.95 cm  Distal               4.10 cm                   4.05 cm  Right Iliac          Unable to visualize  Left Iliac           Unable to visualize   PREVIOUS:  Date:  AP:  TRANSVERSE:   IMPRESSION:  1. Abdominal aortic aneurysm was measured at its largest, 4.10 cm X      4.05 cm and appears somewhat saccular.  2. Unable to visualize common iliac arteries.   ___________________________________________  V. Charlena Cross, MD   AS/MEDQ  D:  09/03/2007  T:  09/03/2007  Job:  [id number]

## 2010-08-03 NOTE — Discharge Summary (Signed)
NAMEJAMOL, [name]              ACCOUNT NO.:  [id number]   MEDICAL RECORD NO.:  [id number]          PATIENT TYPE:  INP   LOCATION:  A332                          FACILITY:  [facility]   PHYSICIAN:  Scott A. Gerda Diss, MD    DATE OF BIRTH:  1946-01-19   DATE OF ADMISSION:  07/02/2008  DATE OF DISCHARGE:  04/16/2010LH                               DISCHARGE SUMMARY   DISCHARGE DIAGNOSES:  1. Congestive heart failure.  2. Hypertension.  3. Hyperlipidemia.  4. History of vascular disease including previous stroke and repair of      an aortic aneurysm.   HOSPITAL COURSE:  This patient presented to the hospital with severe  shortness of breath.  It woke him around 4 in the morning, could not  catch his breath, he was brought to the emergency department.  Denied  any chest pain, had enzymes done which did not show any significant  problem.  His blood pressure when he first came into the hospital showed  a severe elevated blood pressure, but after treatment in the ER along  with IV hydralazine, enalapril, and Lasix, the blood pressure came down  and his blood pressure stayed down while he was in the hospital and his  medicine regimen was adjusted from what it was at home to being put on  lisinopril 20 mg daily, Lasix 40 mg daily, Norvasc 10 mg daily, and stop  Sular and stop doxazosin.  In addition to this, he did have a lipid  profile done that showed elevated lipid readings.  It was also evident  that he has not been getting his medicine on a regular basis.  He was  impressed upon the importance of following that.  He was given CHF  education.  He had an ejection fraction of 60%, so it was not felt to be  a systolic or a pump problem, but instead to be related to the  hypertension. Because of his history of vascular disease, it was felt  important for him to have his coronary arteries define.  He differed on  a catheterization, opted instead for a stress Myoview.  Cardiology  thought that the  patient was stable and he would probably be able to go  home.  His blood pressure was stable, breathing better on the day of  discharge.  Lungs clear.  Heart was regular.  Extremities, no edema.  He  was counseled regarding quitting smoking, counseled regarding Chantix  and if he has depression symptoms or severe nausea, he can stop  medicine.  He was also counseled to limit alcohol to 1 drink or less per  day and was counseled on the importance of getting his medicines on  regular basis and taking them on a regular basis.  He did have CHF  education.  He is instructed to notify us if his weights go up  dramatically or severe shortness of breath rather complications and he  is to follow up with me within 2 weeks and await the results of the  stress Myoview and anything abnormal on the Myoview, he would need  catheterization.  DISCHARGE MEDICATIONS:  1. Aggrenox 1 twice daily.  2. Pravastatin 80 mg every night.  3. Zestril 20 mg daily.  4. Lasix 40 mg daily.  5. Norvasc 10 mg daily.  6. Chantix as directed.   It should be noted we will plan on checking a MET-7 as now patient needs  to follow up within the next 2 weeks and see Cardiology as well.      Scott A. Gerda Diss, MD  Electronically Signed     SAL/MEDQ  D:  07/04/2008  T:  07/05/2008  Job:  [id number]

## 2010-08-03 NOTE — Consult Note (Signed)
NAMEDEVONTRE, [name]              ACCOUNT NO.:  [id number]   MEDICAL RECORD NO.:  [id number]          PATIENT TYPE:  INP   LOCATION:  IC09                          FACILITY:  [facility]   PHYSICIAN:  Gerrit Friends. Dietrich Pates, MD, FACCDATE OF BIRTH:  03/07/1946   DATE OF CONSULTATION:  07/02/2008  DATE OF DISCHARGE:                                 CONSULTATION   REFERRING PHYSICIAN:  Scott A. Luking, MD   HISTORY OF PRESENT ILLNESS:  A 65 year old gentleman with a history of  peripheral vascular disease, cerebrovascular disease, and multiple  cardiovascular risk factors presents with a sudden onset of congestive  heart failure.  [name] has not previously been evaluated by a  cardiologist nor has he undergone any significant cardiac testing.  He  has had hypertension, but this has generally been well controlled  medically.  He has dyslipidemia, also appropriately treated.  He has not  had diabetes.  He does have a significant history of tobacco consumption  with a history of probable COPD, but has never been admitted for an  exacerbation nor required supplemental oxygen or mechanical ventilation.   He presented with development of dyspnea on exertion on the day of  admission that progressed relatively rapidly to dyspnea at rest.  There  is no history of fever, cough, nor sputum production.  He was brought to  the emergency room department where he was felt to be acutely.  With  initial therapy, he has improved dramatically.  There is no chest  discomfort.  Initial chest x-ray showed hyperinflation of lungs  consistent with COPD, cardiomegaly, and moderate interstitial edema.  BNP level was moderately elevated to 1180.  He has diuresed after  initial dosing with furosemide resulting in resolution of his symptoms.   Past medical history is notable for diverticular disease.  He has  required resection of multiple colonic polyps by colonoscopy.  He is  followed by Dr. Jena Gauss for his GI issues.   He was found to have an  abdominal aortic aneurysm and underwent endovascular repair in 2009  without any apparent procedural complications.  There is a vague history  of CVA.   He has no known medical allergies.   MEDICATIONS ON ADMISSION:  1. Aggrenox 25/200 mg b.i.d.  2. Nisoldipine or Sular 40 mg daily.  3. Pravastatin 80 mg daily.  4. Doxazosin 4 mg daily.  5.   SOCIAL HISTORY:  Cigarette smoking, which has continued; no excessive  use of alcohol; retired; married and lives locally; 3 adult children; 60  pack-year history of cigarette smoking that continues, 5-7 ounces  consumption of alcohol per day.   FAMILY HISTORY:  Multiple family members with atherosclerotic disease.   REVIEW OF SYSTEMS:  Notable for gingival surgery in 1994.  He follows a  regular diet.  He requires corrective lenses for near and far vision.  He has impaired hearing on the left.  He is edentulous with upper lower  dentures.  All other systems reviewed and are negative.   PHYSICAL EXAMINATION:  GENERAL:  On exam, pleasant gentleman in no acute  distress.  VITAL  SIGNS:  The temperature is 98.2, heart rate 82 and regular,  respirations 16 and unlabored, blood pressure 115/60, O2 saturation 97%  on 2 L.  HEENT:  Anicteric sclerae; normal lids and conjunctivae; normal oral  mucosa.  NECK:  No jugular venous distention; normal carotid upstrokes with a  right-sided bruit.  ENDOCRINE:  No thyromegaly.  HEMATOPOIETIC:  No adenopathy.  SKIN:  No significant lesions.  PSYCHIATRIC:  Alert and oriented.  CARDIAC:  Normal first and second heart sounds; fourth heart sound  present; modest systolic ejection murmur.  LUNGS:  Few bibasilar rales.  ABDOMEN:  Soft and nontender; no masses; no organomegaly.  EXTREMITIES:  No edema; distal pulses intact.   EKG:  Normal sinus rhythm; LVH.  Laboratory otherwise notable for  potassium of 3.6 and creatinine of 1.27.   Chest x-ray reviewed and does show congestive  heart failure.   IMPRESSION:  [name] presents with the new-onset of pulmonary edema,  likely related to hypertensive heart disease.  Although blood pressure  was fairly high on presentation with a systolic of 200 and a diastolic  of 110, this is probably secondary to his congestive heart failure  rather than the cause of it.  An echocardiogram will be obtained to rule  out systolic dysfunction.  Diastolic dysfunction is likely.  He has  responded well to initial diuresis, which should continue.  There is a  consideration for coronary artery disease, but so far cardiac markers  have been negative.  Lipid status will be checked.   He continues to smoke cigarettes with a component of chronic obstructive  pulmonary disease.  Tobacco cessation is warranted.   Although he has no history of problems related to alcohol intake, his  intake is excessive and should be moderated.  His hypertension is  currently controlled without any significant increase in his usual  medical therapy.  The request for consultation is greatly appreciated.  We will be happy to follow this gentleman with you in hospital and  subsequent to discharge.      Gerrit Friends. Dietrich Pates, MD, [facility]  Electronically Signed     RMR/MEDQ  D:  07/02/2008  T:  07/03/2008  Job:  [id number]

## 2010-08-03 NOTE — Assessment & Plan Note (Signed)
OFFICE VISIT   DAXTON, NYDAM A  DOB:  September 30, 1945                                       09/01/2008  OZHYQ#:[id number]   REASON FOR VISIT:  Follow-up aneurysm.   HISTORY:  This is a 65 year old gentleman who underwent endovascular  repair of a 5.4 cm infrarenal abdominal aortic aneurysm on January 16, 2008.  He has done very well.  He comes in today with a CT scan.  He has  not had any abdominal pain.  Denies claudication.   PHYSICAL EXAMINATION:  Vital signs:  His blood pressure 127/74, pulse  81.  He is well-appearing, no distress.  Abdomen:  Soft, nontender.  No  hepatosplenomegaly.  His incisions in the groin are well-healed.  His  extremities are warm and well perfused.   CT scan was performed today.  There is no evidence of endoleak.  The  aneurysm has decreased in size.   ASSESSMENT/PLAN:  The patient will come back to see me in 6 months with  an abdominal ultrasound.  I think it will be safe to follow him with  ultrasound given that he is now approximately 9 months out with no  evidence of endoleak and shrinking aneurysm sac.  The patient has had  carotid surveillance the past and he has 1-39% stenosis bilaterally.   Jorge Ny, MD  Electronically Signed   VWB/MEDQ  D:  08/31/2008  T:  09/02/2008  Job:  [id number]   cc:   Dr. Darryll Capers  Dr. [name]

## 2010-08-03 NOTE — H&P (Signed)
NAMEJANOS, [name]              ACCOUNT NO.:  [id number]   MEDICAL RECORD NO.:  [id number]         PATIENT TYPE:  PINP   LOCATION:  IC09                          FACILITY:  [facility]   PHYSICIAN:  Donna Bernard, M.D.DATE OF BIRTH:  18-Apr-1945   DATE OF ADMISSION:  07/02/2008  DATE OF DISCHARGE:  LH                              HISTORY & PHYSICAL   CHIEF COMPLAINT:  Shortness of breath.   SUBJECTIVE:  This patient is a 65 year old male who presented to the  hospital on day of admission with complaints of severe shortness of  breath.  He had awoken around 4 in the morning since he could not catch  his breathing.  He was brought to the emergency room.  The patient had  no chest pain.  He noted the day before he felt fine.  The patient had  no significant headache.  He noted decent appetite up until the day  before the patient claims compliance with his chronic medications which  include Aggrenox 25/200 one p.o. just daily, Cardura XL 4 mg at  bedtime., Sular 40 mg one p.o. q.a.m., pravastatin 80 mg p.o. at  bedtime.   PRIOR SURGERIES:  Aneurysm repair, aortic aneurysm repair in June 2009,  colonoscopy with polyps in July 2006.   PRIOR MEDICAL HISTORY:  Significant for chronic problems noted above  including history of TIA, hyperlipidemia, hypertension, and known  aneurysm, and evidence of COPD on a chest x-ray last year.   FAMILY HISTORY:  Positive for hypertension and hyperlipidemia.   SOCIAL HISTORY:  Patient is retired.  He is married, smokes 1 pack per  day despite strong encouragement, otherwise drinks small amount of  alcohol each evening.   ALLERGIES:  None known.   REVIEW OF SYSTEMS:  No chest pain.  No headache.  No focal neurological  symptoms.  No abdominal pain.  Review of systems otherwise negative.   PHYSICAL EXAMINATION:  VITAL SIGNS:  Blood pressure initially 240/140.  The patient in distinct respiratory distress, breathing 32 times per  minute.  GENERAL:   Alert, somewhat apprehensive with tachypnea upon presentation.  Somewhat tachypneic on presentation and apprehensive.  HEENT: Good hydration.  TMs normal.  Fundi discharge.  NECK:  Supple.  No JVD present several hours after admission.  LUNGS:  Bibasilar crackles.  Tachypnea has now improved several hours  post presentation to the emergency room.  HEART:  Mild tachycardia, mild flow murmur.  ABDOMEN:  Soft.  No discrete tenderness.  EXTREMITIES:  Feet without edema.  Distal pulses diminished.  NEURO:  No focal neurological deficit.   SIGNIFICANT LABORATORY DATA:  Chest x-ray; bilateral infiltrates  consistent with congestive heart failure.  Initial blood gas; pCO2 of  48, pO2 of 58, pH of 7.24.  White blood count 12.6, hemoglobin 17.  Urinalysis within normal limits.  TPA was 1180.  Initial cardiac enzymes  negative.  Repeat cardiac enzymes; the myoglobin did bump somewhat.  Chest x-ray continues to show COPD-type changes.   IMPRESSION:  1. Acute hypertensive crisis with likely secondary congestive heart      failure certainly an impressive presentation.  The patient was      treated with BiPAP in the ER along with IV hydralazine, enalapril,      and furosemide.  He has improved at this point.  We backed off him      and held the BiPAP.  The patient is known to some nasal cannula.      He also was given albuterol and Atrovent to cover any component      secondary to respiratory but this does appear to be primarily      cardiac event.  2. Chronic obstructive pulmonary disease.  3. History of aneurysm repair.  4. Chronic smoker.  5. Hyperlipidemia.  6. Hypertension.  7. History of transient ischemic attack   PLAN:  Admit to the ICU.  We will switch over to oral enalapril,  hydralazine, and maintain IV Lasix.  Follow serial cardiac enzymes,  echocardiogram, cardiology consult.  Further orders as noted in the  chart.      Donna Bernard, M.D.  Electronically Signed      WSL/MEDQ  D:  07/02/2008  T:  07/03/2008  Job:  [id number]

## 2010-08-03 NOTE — Procedures (Signed)
CAROTID DUPLEX EXAM   INDICATION:  TIAs.   HISTORY:  Diabetes:  No.  Cardiac:  No.  Hypertension:  Yes.  Smoking:  Yes.  Previous Surgery:  No.  CV History:  Multiple TIAs.  Amaurosis Fugax No, Paresthesias No, Hemiparesis No.                                       RIGHT             LEFT  Brachial systolic pressure:         190               180  Brachial Doppler waveforms:         Biphasic          Biphasic  Vertebral direction of flow:        Antegrade         Antegrade (weak)  DUPLEX VELOCITIES (cm/sec)  CCA peak systolic                   76                79  ECA peak systolic                   58                82  ICA peak systolic                   74                94  ICA end diastolic                   20                25  PLAQUE MORPHOLOGY:                  Mixed             Mixed  PLAQUE AMOUNT:                      Very minimal      Very minimal  PLAQUE LOCATION:                    Bifurcation/ICA   Bifurcation/ICA   IMPRESSION:  1. Bilateral ICAs show evidence of 1-39% stenosis and are tortuous      mid/distal.  2. Bilateral vertebral arteries appear antegrade, however unable to      detect very strong flow on the left.    ___________________________________________  V. Charlena Cross, MD   AS/MEDQ  D:  12/31/2007  T:  12/31/2007  Job:  [id number]

## 2010-08-03 NOTE — Assessment & Plan Note (Signed)
OFFICE VISIT   NESBIT, MICHON A  DOB:  Aug 18, 1945                                       09/03/2007  FAOZH#:[id number]   REASON FOR VISIT:  Right leg ulcer.   HISTORY:  This is a 65 year old gentleman seen at the request of Dr.  [name] for evaluation of peripheral vascular disease.  The patient  states that he had a right leg sore, which has been present for  approximately 3 months.  It is painful and wakes him up at night.  He  has been recently treated with antibiotics by his report, and that the  wound has nearly resolved.  His pain has also improve traumatically.  He  is now able to sleep through the night.  He denies significant  claudication.  He states that he is able to walk several blocks without  cramping.  Occasionally, his leg will get tired if he walks too far or  too fast.  The patient does have an arterial duplex study performed,  which reveals ankle brachial indices of 0.62 in the right and 0.7 on the  left.  The patient denies any other open areas.   The patient's risk factors include hypertension and  hypercholesterolemia, both of which he is controlling with medication.  He also has a significant smoking history.  He smoked a pack-and-a-half  for over 40 years.  He also has had a history of stroke.  No cardiac  disease at this time.   REVIEW OF SYSTEMS:  GENERAL:  Positive for loss of appetite.  CARDIAC:  Occasional shortness of breath with exertion.  PULMONARY:  Negative.  GI:  Occasional constipation.  GU:  Negative.  VASCULAR:  Occasional pain in legs with walking, history of stroke.  NEURO:  Negative.  ORTHO/SKIN:  Positive for rash.  PSYCH:  Negative.  ENT:  Negative.  HEME:  Negative.   PAST MEDICAL HISTORY:  Hypertension, history of stroke,  hypercholesterolemia.   FAMILY HISTORY:  Positive for coronary artery disease in his mother.   SOCIAL HISTORY:  He is married with 3 children.  He is retired.  He  currently  smokes a pack-and-a-half a day.  He drinks approximately 5 to  7 drinks per day, mostly rum.   ALLERGIES:  None known drug allergies.   MEDICATIONS:  Include Aggrenox once daily, doxazosin 4 mg once daily,  nisoldipine 40 mg per day, and pravastatin 80 mg per day.   PHYSICAL EXAMINATION:  Vital Signs:  Heart rate is 82, blood pressure is  139/79.  General:  He is well-appearing in no acute stress.  HEENT:  Normocephalic, atraumatic.  Pupils are equal.  Sclerae are anicteric.  Neck is supple.  There is no JVD.  There are no carotid bruits.  Cardiovascular:  Regular rate and rhythm.  No murmurs, rubs or gallops.  Pulmonary:  Lungs are clear bilaterally.  Abdomen is soft, nontender.  No hepatosplenomegaly.  There is evidence of a pulsatile mass.  Extremities:  There are palpable femoral pulses.  Otherwise, pedal  pulses are not palpable.  The area of concern on the lateral aspect of  right leg is now healed.  There is no evidence of ulceration to either  extremity.  The skin color of the right leg is darker than the left.  Neuro:  Cranial nerves 2-12 grossly intact.  Psych:  He is alert and  oriented x3.   ASSESSMENT:  Peripheral vascular disease.   PLAN:  At this time I had a lengthy conversation with the patient  regarding management at this time.  Given that he does not have  lifestyle-limiting claudication and that he has no active ulcers in his  feet, I would not recommended proceeding with any intervention at this  time.  We had an extensive conversation regarding smoking cessation.  At  this time, I think his best option for long-term limb preservation would  be focusing on medical management including maintaining acceptable blood  pressure as well as cholesterol levels, and to abstain from cigarettes.   I am going to send the patient for an ultrasound today to evaluate his  abdominal aorta, to see the extent of his aneurysm.  He will follow up  with me in 3 months.  At that  time, we will reassess his claudication,  as well as whether or not he has stopped smoking.   ADDENDUM:  His ultrasound revealed abdominal aortic aneurysm measuring  approximately 4 cm in size, which was somewhat saccular.  I am going to  have him get to a CT scan prior to his next visit to further evaluate  this.   Jorge Ny, MD  Electronically Signed   VWB/MEDQ  D:  09/03/2007  T:  09/04/2007  Job:  [id number]   cc:   Lorin Picket A. Gerda Diss, MD

## 2010-08-03 NOTE — Procedures (Signed)
VASCULAR LAB EXAM   INDICATION:  Followup abdominal aortic endograft placed 2009.   HISTORY:  Diabetes:  No.  Cardiac:  No.  Hypertension:  Yes.   AAA sac size was 3.1 cm AP, transverse was 3.0 cm transverse.  Previous  sac size was 3.6 cm AP, 3.1 cm transverse.   EXAM:   IMPRESSION:  The aorta and endograft appear patent.  No significant  change in the aneurysmal sac surrounding the graft although there has  been a decrease in size.  Today's measurement was 3.1 x 3.1 cm.  No  evidence of an endo leak was detected.   ___________________________________________  V. Charlena Cross, MD   OD/MEDQ  D:  03/29/2010  T:  03/29/2010  Job:  [id number]

## 2010-08-06 NOTE — Discharge Summary (Signed)
NAME:  [name], [name]                        ACCOUNT NO.:  [id number]   MEDICAL RECORD NO.:  [id number]                   PATIENT TYPE:  INP   LOCATION:  A228                                 FACILITY:  [facility]   PHYSICIAN:  Jerolyn Shin C. Katrinka Blazing, M.D.                DATE OF BIRTH:  March 01, 1946   DATE OF ADMISSION:  11/08/2002  DATE OF DISCHARGE:  11/11/2002                                 DISCHARGE SUMMARY   DISCHARGE DIAGNOSES:  1. Acute diplopia, probably due to transient ischemic episode of sixth     cranial nerve nucleus.  2. Hypertension.   DISPOSITION:  The patient is discharged home in stable and satisfactory  condition.  He will have follow-up with Dr. Loleta Chance on August 31 and will have  follow-up with Dr. Gerilyn Pilgrim on November 21, 2002.   DISCHARGE MEDICATIONS:  1. Aspirin 325 mg daily.  2. Pletal 100 mg daily.  3. Sular 40 mg daily.   SUMMARY:  This is a 65 year old male admitted for acute onset of severe  diplopia with __________  .  The patient was stable until he awakened on the  mid day of November 08, 2002.  When he got up to go to the bathroom he had  diplopia in all fields of vision.  He states that he felt sleepy, and he  decided to go back to bed.  He did not have any weakness or numbness.  When  he awakened the second time he still had diplopia so he came to the  emergency room.  On ER evaluation it was confirmed that he had acute  diplopia in all fields of vision.  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  He was admitted for evaluation of  problem lateral rectus muscle palsy.  Past history was positive for  hypertension.  Examination revealed diplopia in all fields of vision except  on left lateral gaze.  He did not have any blurring of vision.  He had  obvious weakness of the right lateral gaze only in the left eye.  He did not  have any bruits.   The  patient was admitted and started on telemetry.  He was placed on Pletal  and aspirin.  His left eye was patched, and this resolved his diplopia.  By  November 10, 2002, he was doing much better.  His diplopia had resolved.  He  had single vision in all fields of vision.  There was no difficulty with  ambulation.  Vital signs were stable.  Repeat CT of the head with contrast  revealed no acute changes.  He had no further problems, and the patient was  discharged home with plans for follow-up carotid Doppler study,  echocardiogram, and neurology evaluation.     ___________________________________________  Dirk Dress. Katrinka Blazing, M.D.   LCS/MEDQ  D:  01/12/2003  T:  01/12/2003  Job:  [id number]

## 2010-08-06 NOTE — Procedures (Signed)
   NAME:  [name], [name]                        ACCOUNT NO.:  [id number]   MEDICAL RECORD NO.:  [id number]                   PATIENT TYPE:  OUT   LOCATION:  RAD                                  FACILITY:  [facility]   PHYSICIAN:  Thomas C. Wall, M.D.                DATE OF BIRTH:  04/14/1945   DATE OF PROCEDURE:  DATE OF DISCHARGE:                                  ECHOCARDIOGRAM   REQUESTING PHYSICIAN:  Annia Friendly. Loleta Chance, M.D.   INDICATIONS:  Stroke.  434.91   FINDINGS:  The echocardiogram is of adequate technical quality.   CONCLUSIONS:  1. Normal left and right ventricular size.  2. Normal left ventricular chamber size with reduced systolic function.     There is mild septal hyperkinesis distally with some question of some     thinning as well.  Ejection fraction ranged to 50%.  3. Aortic valve sclerosis without stenosis.  There is mild aortic     insufficiency.  4. Mild tricuspid regurgitation with an estimated right ventricular systolic     pressure of 86 mmHg.  The right ventricle appears to be functioning     normally.  5. No obvious clot or intracavitary thrombus seen.  6. No pericardial effusion.                                               Thomas C. Wall, M.D.    TCW/MEDQ  D:  11/13/2002  T:  11/13/2002  Job:  [id number]

## 2010-08-06 NOTE — H&P (Signed)
[name], [name]              ACCOUNT NO.:  [id number]   MEDICAL RECORD NO.:  [id number]          PATIENT TYPE:  AMB   LOCATION:  DAY                           FACILITY:  [facility]   PHYSICIAN:  Jerolyn Shin C. Katrinka Blazing, M.D.   DATE OF BIRTH:  11/11/1945   DATE OF ADMISSION:  DATE OF DISCHARGE:  LH                                HISTORY & PHYSICAL   HISTORY OF PRESENT ILLNESS:  The patient is a 65 year old male with history  of multiple colonic polyps with evidence of chronic rectal pain made worse  with bowel movements.  He denies constipation.  There is prior history of  diverticulosis.  He has had some gradual weight loss in the past year.  Last  colonoscopy was in February 2001.  The patient is scheduled to have  colonoscopy here.   PAST MEDICAL HISTORY:  1.  Diverticulosis.  2.  Chronic obstructive lung disease.  3.  Hypertension.  4.  Hyperlipidemia.   MEDICATIONS:  1.  Sular 50 mg daily.  2.  Aspirin 325 mg daily.   PAST SURGICAL HISTORY:  None.   ALLERGIES:  None.   FAMILY HISTORY:  Negative.   SOCIAL HISTORY:  Smokes a pack of cigarettes a day.  Drinks occasional  alcoholic beverage.   PHYSICAL EXAMINATION:  VITAL SIGNS:  Blood pressure 128/66, pulse 68,  respirations.  HEENT:  Unremarkable.  NECK:  Supple.  No JVD, bruits, adenopathy, or thyromegaly.  CHEST:  Clear to auscultation.  HEART:  Regular rate and rhythm without murmur, gallop or rub.  ABDOMEN:  Soft, nontender.  No masses.  EXTREMITIES:  No cyanosis, clubbing or edema.  NEUROLOGIC:  No focal motor, sensory or cerebellar deficits.   IMPRESSION:  1.  Chronic rectal pain.  2.  History of colonic polyps.  3.  Diverticulosis.  4.  Hypertension.   PLAN:  Repeat colonoscopy.      Dirk Dress. Katrinka Blazing, M.D.  Electronically Signed     LCS/MEDQ  D:  10/13/2004  T:  10/14/2004  Job:  [id number]

## 2010-08-06 NOTE — H&P (Signed)
NAME:  [name], [name]                        ACCOUNT NO.:  [id number]   MEDICAL RECORD NO.:  [id number]                   PATIENT TYPE:  INP   LOCATION:  A228                                 FACILITY:  [facility]   PHYSICIAN:  Jerolyn Shin C. Katrinka Blazing, M.D.                DATE OF BIRTH:  18-Nov-1945   DATE OF ADMISSION:  11/08/2002  DATE OF DISCHARGE:                                HISTORY & PHYSICAL   HISTORY OF PRESENT ILLNESS:  A 65 year old male admitted for acute onset of  severe diplopia with dizziness.  The patient states that he was stable until  he awakened in the mid-day of November 08, 2002.  When he got up to go to the  bathroom, he had diplopia in all fields of vision.  He felt that he was  still sleepy, and decided to go back to bed.  He returned to bed, and was  still stable.  He did not have any weakness or numbness anywhere in his  body.  When he awakened the second time, he still had diplopia, so he came  to the emergency room.  In the emergency room, it was confirmed that he had  acute diplopia in all fields of vision.  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   PAST MEDICAL HISTORY:  Hypertension.  He has no other medical problems, and  he has not had any major injuries.   MEDICATIONS:  1. Takes Zoloft 40 mg daily.  2. DynaCirc 4 mg daily.  3. He uses some multivitamins.   PAST SURGICAL HISTORY:  He has not had surgery.   HOSPITALIZATIONS:  There have been no hospitalizations.   FAMILY HISTORY:  1. Positive for asthma  2. Gallbladder  3. Cancer  4. Scleroderma.   SOCIAL HISTORY:  The patient is employed at [employer].  He is  married.  He smokes 1-1/2 packs of cigarettes per day.  He drinks 2 ounces  of whiskey every day.  He denies any  type of drug use.   PHYSICAL EXAMINATION:  VITAL SIGNS:  Blood pressure on admission was 115/69,  pulse 47, respirations 20, temperature 96.9.  HEENT:  Unremarkable, except that the patient has diplopia in all fields of  vision except on left lateral gaze.  He does not have any blurring of  vision. He has obvious weakness of right lateral gaze involving the left  eye.  NECK:  Supple.  No JVD or bruits.  No adenopathy or thyromegaly.  CHEST:  Clear to auscultation.  HEART:  Mild bradycardia.  Regular rhythm.  No murmur or gallop.  ABDOMEN:  Soft, nontender.  No masses.  EXTREMITIES:  No clubbing, cyanosis,  or edema.  Weak peripheral pulses, but  dorsalis pedis pulse and posterior tibia are present.  NEUROLOGIC:  No focal motor deficit except for extraocular movement  dysfunction.   IMPRESSION:  1. Acute diplopia, probably due to limited ischemic episode involving     nucleus of sixth cranial nerve.  2. History of hypertension.   PLAN:  The patient will be admitted and observed.  We will place him on  telemetry to make sure he does not have any arrhythmias.  He will be started  on Pletal and aspirin.  Will repeat his CT in 48 hours, and will get  neurology consult if his symptoms worsen.  If symptoms do not worsen, will  patch his left eye, and will allow him to be discharged and follow up with  Dr. Loleta Chance.                                               Dirk Dress. Katrinka Blazing, M.D.    LCS/MEDQ  D:  11/09/2002  T:  11/09/2002  Job:  [id number]

## 2010-09-15 ENCOUNTER — Other Ambulatory Visit: Payer: Self-pay | Admitting: Cardiology

## 2010-09-20 ENCOUNTER — Encounter: Payer: Self-pay | Admitting: *Deleted

## 2010-09-20 ENCOUNTER — Encounter: Payer: Self-pay | Admitting: Adult Health

## 2010-09-20 ENCOUNTER — Ambulatory Visit (INDEPENDENT_AMBULATORY_CARE_PROVIDER_SITE_OTHER): Payer: Medicare Other | Admitting: Adult Health

## 2010-09-20 DIAGNOSIS — E7800 Pure hypercholesterolemia, unspecified: Secondary | ICD-10-CM

## 2010-09-20 DIAGNOSIS — I519 Heart disease, unspecified: Secondary | ICD-10-CM

## 2010-09-20 DIAGNOSIS — F17200 Nicotine dependence, unspecified, uncomplicated: Secondary | ICD-10-CM

## 2010-09-20 DIAGNOSIS — I423 Endomyocardial (eosinophilic) disease: Secondary | ICD-10-CM

## 2010-09-20 DIAGNOSIS — J4489 Other specified chronic obstructive pulmonary disease: Secondary | ICD-10-CM

## 2010-09-20 DIAGNOSIS — J449 Chronic obstructive pulmonary disease, unspecified: Secondary | ICD-10-CM

## 2010-09-20 DIAGNOSIS — Z01818 Encounter for other preprocedural examination: Secondary | ICD-10-CM

## 2010-09-20 MED ORDER — ATORVASTATIN CALCIUM 20 MG PO TABS: 20.0000 mg | ORAL_TABLET | Freq: Every day | ORAL | Status: DC

## 2010-09-20 NOTE — Progress Notes (Signed)
HPI:  [name] is a 65 y/o patient of Dr. Dietrich Pates and Dr. Gerda Diss with known history of diastolic CHF, hypertension, ETOH abuse, CKD, CvA, TIA's, and medical noncompliance. [name] recently hospitalized at [name] in June of this year for decompensated CHF and hypertensive urgency. He was seen by hospitalist Dr. Carrington Clamp MD, hospitalist, and [name], cardiologist.  Echocardiogram was completed and he was found to have EF of 30%.  He was placed on clonidine, labetalol and metoprolol, placed on coreg 3.125 BID,ASA 81mg  daily, amlodipine was decreased from 10mg  to 5mg  daily. Creat on discharge was 1.7.  It was recommended by cardiology during that hospitalization to proceed with cardiac catheterization but the patient and his wife wished to follow-up with local cardiologist, [name].    He was seen in the interm by Dr. Gerda Diss and medications on discharge were refilled. The patient unfortunately continues to drink liquor and smoke, but is complaint with his medications. He has not gone to AA or tried to quit drinking or smoking.   He states he is feeling pretty good and has had no further complaints of chest pain or DOE.  He is willing to proceed with cardiac catheterization for evaluation for CAD with multiple CVRF.     No Known Allergies  Current Outpatient Prescriptions  Medication Sig Dispense Refill  . amLODipine (NORVASC) 5 MG tablet Take 5 mg by mouth daily.        Marland Kitchen aspirin 81 MG tablet Take 81 mg by mouth daily.        Marland Kitchen buPROPion (WELLBUTRIN XL) 150 MG 24 hr tablet Take 150 mg by mouth 2 (two) times daily.        . carvedilol (COREG) 3.125 MG tablet Take 3.125 mg by mouth 2 (two) times daily with a meal.        . colesevelam (WELCHOL) 625 MG tablet Take 1,875 mg by mouth 2 (two) times daily with a meal.        . CRESTOR 40 MG tablet TAKE ONE TABLET BY MOUTH DAILY.  30 tablet  3  . dipyridamole-aspirin (AGGRENOX) 25-200 MG per 12 hr capsule Take 1 capsule by mouth  2 (two) times daily.        . folic acid (FOLVITE) 1 MG tablet Take 1 mg by mouth daily.        . furosemide (LASIX) 40 MG tablet Take 40 mg by mouth.        Marland Kitchen lisinopril (PRINIVIL,ZESTRIL) 20 MG tablet Take 20 mg by mouth daily.        . Omega-3 Fatty Acids 300 MG CAPS Take 1 capsule by mouth daily.        Marland Kitchen terazosin (HYTRIN) 10 MG capsule Take 10 mg by mouth at bedtime.          Past Medical History  Diagnosis Date  . Hypertension   . COPD (chronic obstructive pulmonary disease)   . Hyperlipidemia   . Systolic heart failure     EF of 30% per echo in June of 2012  . History of noncompliance with medical treatment   . TIA (transient ischemic attack)     Past Surgical History  Procedure Date  . Etoh abuse   . Abdominal aortic aneurysm repair     EAV:WUJWJX of systems complete and found to be negative unless listed above PHYSICAL EXAM BP 108/71  Pulse 87  Ht 6\' 3"  (1.905 m)  Wt 164 lb (74.39 kg)  BMI 20.50 kg/m2  SpO2 96% General: Well developed, well nourished, in no acute distress Head: Eyes PERRLA, No xanthomas.   Normal cephalic and atramatic  Lungs: Clear bilaterally to auscultation and percussion. Heart: HRRR S1 S2,.  Pulses are 2+ & equal.            No carotid bruit. No JVD.  No abdominal bruits. No femoral bruits. Abdomen: Bowel sounds are positive, abdomen soft and non-tender without masses or                  Hernia's noted. Msk:  Back normal, normal gait. Normal strength and tone for age. Extremities: No clubbing, cyanosis or edema.  DP +1 Neuro: Alert and oriented X 3. Psych:  Good affect, responds appropriately EKG: SR with PVC's and LVH rate of 77 bpm.  ASSESSMENT AND PLAN

## 2010-09-20 NOTE — Assessment & Plan Note (Signed)
He is advised to quit smoking.  He verbalizes understanding.  Does not plan on quitting anytime soon.

## 2010-09-20 NOTE — Assessment & Plan Note (Signed)
[name] has significant decrease in EF per echo completed in [facility] [place] hospital with admission for mixed cardiomyopathy.  He has multiple risk factors for CAD.  It was been recommended that he proceed with cardiac catheterization for further evaluation of coronary anatomy in this setting. He has CKD with last Creat. 1.7, so "cors only" will be requested if he remains elevated on repeat labs.   He is advised of the risks and benefits of the procedure and is willing to have the test done. I am concerned about follow-up and medical noncompliance in this setting with history of same. He states that he will follow-up and will take medications as directed as he has been since discharge 3 weeks ago. At the discretion of the cardiologist, he may need to be considered for ICD if LV does not improve.  He will follow-up with Dr. Dietrich Pates post catheterization.

## 2010-09-21 ENCOUNTER — Encounter: Payer: Self-pay | Admitting: *Deleted

## 2010-09-21 LAB — BASIC METABOLIC PANEL
CO2: 24 mEq/L (ref 19–32)
Chloride: 103 mEq/L (ref 96–112)
Glucose, Bld: 94 mg/dL (ref 70–99)
Sodium: 141 mEq/L (ref 135–145)

## 2010-09-21 LAB — CBC WITH DIFFERENTIAL/PLATELET
Basophils Relative: 1 % (ref 0–1)
Eosinophils Absolute: 0.4 10*3/uL (ref 0.0–0.7)
Eosinophils Relative: 5 % (ref 0–5)
HCT: 45.9 % (ref 39.0–52.0)
Hemoglobin: 16.1 g/dL (ref 13.0–17.0)
MCH: 34.5 pg — ABNORMAL HIGH (ref 26.0–34.0)
MCHC: 35.1 g/dL (ref 30.0–36.0)
MCV: 98.5 fL (ref 78.0–100.0)
Monocytes Absolute: 0.7 10*3/uL (ref 0.1–1.0)
Monocytes Relative: 10 % (ref 3–12)
Neutro Abs: 3.5 10*3/uL (ref 1.7–7.7)

## 2010-09-21 LAB — PROTIME-INR: INR: 1.01 (ref <1.50)

## 2010-09-23 ENCOUNTER — Inpatient Hospital Stay (HOSPITAL_BASED_OUTPATIENT_CLINIC_OR_DEPARTMENT_OTHER)
Admission: RE | Admit: 2010-09-23 | Discharge: 2010-09-23 | Disposition: A | Discharge status: Home / Self Care | Payer: Medicare Other | Source: Ambulatory Visit | Attending: Cardiology | Admitting: Cardiology

## 2010-09-23 ENCOUNTER — Inpatient Hospital Stay (HOSPITAL_COMMUNITY)
Admission: AD | Admit: 2010-09-23 | Discharge: 2010-09-24 | DRG: 287 | Disposition: A | Discharge status: Home Health | Payer: Medicare Other | Source: Ambulatory Visit | Attending: Cardiology | Admitting: Cardiology

## 2010-09-23 DIAGNOSIS — E785 Hyperlipidemia, unspecified: Secondary | ICD-10-CM | POA: Diagnosis present

## 2010-09-23 DIAGNOSIS — J4489 Other specified chronic obstructive pulmonary disease: Secondary | ICD-10-CM | POA: Diagnosis present

## 2010-09-23 DIAGNOSIS — I129 Hypertensive chronic kidney disease with stage 1 through stage 4 chronic kidney disease, or unspecified chronic kidney disease: Secondary | ICD-10-CM | POA: Diagnosis present

## 2010-09-23 DIAGNOSIS — Z91199 Patient's noncompliance with other medical treatment and regimen due to unspecified reason: Secondary | ICD-10-CM

## 2010-09-23 DIAGNOSIS — I5020 Unspecified systolic (congestive) heart failure: Secondary | ICD-10-CM | POA: Diagnosis present

## 2010-09-23 DIAGNOSIS — I428 Other cardiomyopathies: Secondary | ICD-10-CM | POA: Diagnosis present

## 2010-09-23 DIAGNOSIS — I2510 Atherosclerotic heart disease of native coronary artery without angina pectoris: Principal | ICD-10-CM | POA: Diagnosis present

## 2010-09-23 DIAGNOSIS — J449 Chronic obstructive pulmonary disease, unspecified: Secondary | ICD-10-CM | POA: Diagnosis present

## 2010-09-23 DIAGNOSIS — F1010 Alcohol abuse, uncomplicated: Secondary | ICD-10-CM | POA: Diagnosis present

## 2010-09-23 DIAGNOSIS — Z7982 Long term (current) use of aspirin: Secondary | ICD-10-CM

## 2010-09-23 DIAGNOSIS — N189 Chronic kidney disease, unspecified: Secondary | ICD-10-CM | POA: Diagnosis present

## 2010-09-23 DIAGNOSIS — Z9119 Patient's noncompliance with other medical treatment and regimen: Secondary | ICD-10-CM

## 2010-09-23 LAB — BASIC METABOLIC PANEL
BUN: 16 mg/dL (ref 6–23)
CO2: 28 mEq/L (ref 19–32)
Chloride: 94 mEq/L — ABNORMAL LOW (ref 96–112)
GFR calc Af Amer: 38 mL/min — ABNORMAL LOW (ref >60)
Potassium: 3.5 mEq/L (ref 3.5–5.1)

## 2010-09-24 ENCOUNTER — Encounter: Payer: Self-pay | Admitting: Adult Health

## 2010-09-24 DIAGNOSIS — I2510 Atherosclerotic heart disease of native coronary artery without angina pectoris: Secondary | ICD-10-CM

## 2010-09-24 LAB — CBC
HCT: 37.6 % — ABNORMAL LOW (ref 39.0–52.0)
MCV: 94.5 fL (ref 78.0–100.0)
Platelets: 143 10*3/uL — ABNORMAL LOW (ref 150–400)
RBC: 3.98 MIL/uL — ABNORMAL LOW (ref 4.22–5.81)
RDW: 12.2 % (ref 11.5–15.5)
WBC: 6.3 10*3/uL (ref 4.0–10.5)

## 2010-09-24 LAB — BASIC METABOLIC PANEL
CO2: 25 mEq/L (ref 19–32)
Chloride: 105 mEq/L (ref 96–112)
Creatinine, Ser: 2.01 mg/dL — ABNORMAL HIGH (ref 0.50–1.35)
GFR calc Af Amer: 40 mL/min — ABNORMAL LOW (ref >60)
Potassium: 4.2 mEq/L (ref 3.5–5.1)

## 2010-09-24 LAB — PROTIME-INR: Prothrombin Time: 12.7 seconds (ref 11.6–15.2)

## 2010-09-28 ENCOUNTER — Other Ambulatory Visit: Payer: Self-pay

## 2010-09-30 ENCOUNTER — Other Ambulatory Visit: Payer: Self-pay | Admitting: Cardiology

## 2010-09-30 ENCOUNTER — Encounter: Payer: Self-pay | Admitting: Adult Health

## 2010-10-01 ENCOUNTER — Encounter: Payer: Self-pay | Admitting: *Deleted

## 2010-10-01 LAB — BASIC METABOLIC PANEL
BUN: 15 mg/dL (ref 6–23)
Chloride: 103 mEq/L (ref 96–112)
Potassium: 4.2 mEq/L (ref 3.5–5.3)
Sodium: 138 mEq/L (ref 135–145)

## 2010-10-02 ENCOUNTER — Other Ambulatory Visit: Payer: Self-pay | Admitting: Cardiology

## 2010-10-03 ENCOUNTER — Encounter: Payer: Self-pay | Admitting: Cardiology

## 2010-10-03 DIAGNOSIS — E785 Hyperlipidemia, unspecified: Secondary | ICD-10-CM | POA: Insufficient documentation

## 2010-10-03 DIAGNOSIS — I119 Hypertensive heart disease without heart failure: Secondary | ICD-10-CM | POA: Insufficient documentation

## 2010-10-03 DIAGNOSIS — F1010 Alcohol abuse, uncomplicated: Secondary | ICD-10-CM | POA: Insufficient documentation

## 2010-10-03 DIAGNOSIS — Z720 Tobacco use: Secondary | ICD-10-CM | POA: Insufficient documentation

## 2010-10-04 NOTE — Discharge Summary (Addendum)
[name], [name] NO.:  [id number]  MEDICAL RECORD NO.:  [id number]  LOCATION:  4711                         FACILITY:  [facility]  PHYSICIAN:  Greg Ancona, MD      DATE OF BIRTH:  Sep 09, 1945  DATE OF ADMISSION:  09/23/2010 DATE OF DISCHARGE:  09/24/2010                              DISCHARGE SUMMARY   DISCHARGE DIAGNOSES: 1. Nonobstructive coronary artery disease by cath September 24, 2010. 2. Systolic heart failure with EF of 30% by echo June 2012.     a.     ACE inhibitor held post catheterization to reduce risk of      contrast nephropathy, consider resuming as outpatient if      creatinine stable. 3. Hypertension. 4. Chronic obstructive pulmonary disease. 5. Hyperlipidemia. 6. History of noncompliance with medical treatment. 7. Transient ischemic attack, 8. Alcohol abuse. 9. Status post aortic aneurysm repair.  HOSPITAL COURSE:  [name] is an 65 year old gentleman with a history of hypertension who was recently hospitalized in [place], [place], of this year for decompensated heart failure, hypertensive urgency.  He was found to have an EF of 30%, was started on medical therapy.  It was recommended during the hospitalization to receive a cardiac catheterization, but the patient wished to follow up with local cardiologist.  He does have known chronic kidney disease, so this was carefully monitored prior to catheterization.  His admitting creatinine was 2.11, felt slightly higher than his baseline, so he was subsequently admitted to hospital for hydration pre-cath.  His creatinine decreased to 0.01 today and he was felt suitable for heart catheterization.  This was performed by Dr. Marca [name] with findings consistent with nonobstructive CAD.  The patient is already on aspirin.  Beta-blocker and statin will be continued.  Per discussion with Dr. Shirlee [name] on the phone, the patient's ACE inhibitor will be held post discharge in light of his  creatinine, with consideration to restart as an outpatient if Dr. Dietrich [name] sees fit.  Dr. Shirlee [name] has seen and examined the patient today and feels he is stable for discharge.  DISCHARGE LABS:  WBC 6.3, hemoglobin 13.6, hematocrit 37, platelet count 143.  Sodium 138, potassium 4.2, chloride 105, CO2 of 25, glucose 81, BUN 16, creatinine 2.01.  STUDIES:  Cardiac catheterization September 24, 2010, please see full report for details.  DISCHARGE MEDICATIONS: 1. Nitroglycerin sublingual 0.4 mg every 5 minutes as needed up to     three doses for chest pain. 2. Aggrenox 25/200 mg b.i.d. 3. Amlodipine 5 mg daily. 4. Aspirin 81 mg chewable daily. 5. Bupropion 150 mg b.i.d. 6. Carvedilol 3.125 mg b.i.d. with meals. 7. Crestor 40 nightly. 8. Folic acid 1 mg daily. 9. Furosemide 40 mg every morning. 10.Hydrocortisone OTC 1 application topically daily. 11.Terazosin 10 mg daily nightly. 12.WelChol 625 mg 2 tablets b.i.d. 13.Omega Red 300 mg daily.  Please note, the patient's ACE inhibitor is on hold secondary to his renal insufficiency in light of having a catheterization this admission.  DISPOSITION:  [name] will be discharged in stable condition to home.  He is to follow a low-salt, heart-healthy diet.  Not to lift anything over 5 pounds for 1 week,  drive for 2 days, or pursue sexual activity for 1 week.  He is to call or return for pain, swelling, bleeding or pus at the cath site.  Our office will call him to schedule an appointment with Dr. Dietrich [name] in approximately 10-14 days, and he will also have a BMET next week to ensure stability of his creatinine given his catheterization this admission.  DURATION OF DISCHARGE ENCOUNTER:  Greater than 30 minutes including physician and PA time.     [name], P.A.C.   ______________________________ Greg Ancona, MD    DD/MEDQ  D:  09/24/2010  T:  09/25/2010  Job:  [id number]  cc:   [name]. Greg Pates, MD, [facility]  Electronically Signed  by [name]  on 10/04/2010 01:47:45 PM Electronically Signed by Greg Ancona MD on 10/07/2010 01:33:14 PM

## 2010-10-07 ENCOUNTER — Encounter: Payer: Self-pay | Admitting: Adult Health

## 2010-10-07 ENCOUNTER — Other Ambulatory Visit: Payer: Self-pay | Admitting: Adult Health

## 2010-10-07 ENCOUNTER — Ambulatory Visit (INDEPENDENT_AMBULATORY_CARE_PROVIDER_SITE_OTHER): Payer: Medicare Other | Admitting: Adult Health

## 2010-10-07 DIAGNOSIS — I428 Other cardiomyopathies: Secondary | ICD-10-CM

## 2010-10-07 DIAGNOSIS — I10 Essential (primary) hypertension: Secondary | ICD-10-CM

## 2010-10-07 DIAGNOSIS — I2510 Atherosclerotic heart disease of native coronary artery without angina pectoris: Secondary | ICD-10-CM

## 2010-10-07 DIAGNOSIS — I5022 Chronic systolic (congestive) heart failure: Secondary | ICD-10-CM

## 2010-10-07 MED ORDER — CARVEDILOL 6.25 MG PO TABS: 6.2500 mg | ORAL_TABLET | Freq: Two times a day (BID) | ORAL | Status: DC

## 2010-10-07 NOTE — Progress Notes (Signed)
HPI:[name] is a 65 y/o patient of Dr. Dietrich Pates we are seeing on hospital follow-up after admission for A/C systolic CHF.  He has a history of nonischemic CM with EF of 30%, hypertension, COPD, hyperlipidemia, ETOH abuse and noncompliance.  He had a cardiac catheterization during admission revealing nonobstructive CAD. ACE inhibitor was discontinued secondary to concerns about contrast dye nephropathy, has his admitting creatinine was 2.11 on admission.  He was well hydrated prior to cath with creat of 0.01 on discharge.  He was diuresed with discharge wt of 75.3 kg ( 165.6 lbs).  He denies any medical noncompliance or ETOH use on this visit.  He states he is not eating salty foods either. He complains of lack of appetite and "cardboard taste" to foods and therefore does not eat much.  He denies any chest pain or dyspnea, no edema.  No Known Allergies  Current Outpatient Prescriptions  Medication Sig Dispense Refill  . amLODipine (NORVASC) 5 MG tablet Take 5 mg by mouth daily.        Marland Kitchen aspirin 81 MG tablet Take 81 mg by mouth daily.        Marland Kitchen buPROPion (WELLBUTRIN XL) 150 MG 24 hr tablet TAKE 1 TABLET BY MOUTH ONCE A DAY X 3 DAYS THEN INCREASE TO TWO TIMES A DAY  60 tablet  3  . carvedilol (COREG) 6.25 MG tablet Take 1 tablet (6.25 mg total) by mouth 2 (two) times daily with a meal.  60 tablet  3  . colesevelam (WELCHOL) 625 MG tablet Take 1,875 mg by mouth 2 (two) times daily with a meal.        . CRESTOR 40 MG tablet TAKE ONE TABLET BY MOUTH DAILY.  30 tablet  3  . dipyridamole-aspirin (AGGRENOX) 25-200 MG per 12 hr capsule Take 1 capsule by mouth 2 (two) times daily.        . folic acid (FOLVITE) 1 MG tablet Take 1 mg by mouth daily.        . furosemide (LASIX) 40 MG tablet Take 40 mg by mouth.        Marland Kitchen lisinopril (PRINIVIL,ZESTRIL) 20 MG tablet Take 20 mg by mouth daily.        . Omega-3 Fatty Acids 300 MG CAPS Take 1 capsule by mouth daily.        Marland Kitchen terazosin (HYTRIN) 10 MG capsule Take 10  mg by mouth at bedtime.        Marland Kitchen DISCONTD: carvedilol (COREG) 3.125 MG tablet Take 3.125 mg by mouth 2 (two) times daily with a meal.          Past Medical History  Diagnosis Date  . Hypertension   . COPD (chronic obstructive pulmonary disease)   . Hyperlipidemia   . Cardiomyopathy     EF of 30% per echo in June of 2012; admitted with congestive heart failure  . History of noncompliance with medical treatment   . Cerebrovascular disease 2011    h/o CVA/TIA  . Chronic kidney disease     creatinine-1.7 in 08/2010  . Alcohol abuse   . Tobacco abuse     40 pack years    Past Surgical History  Procedure Date  . Abdominal aortic aneurysm repair w/ endoluminal graft     ZOX:WRUEAV of systems complete and found to be negative unless listed above PHYSICAL EXAM BP 147/84  Pulse 103  Ht 6\' 3"  (1.905 m)  Wt 158 lb (71.668 kg)  BMI 19.75 kg/m2  SpO2  96% General: Well developed, well nourished, in no acute distress Head: Eyes PERRLA, No xanthomas.   Normal cephalic and atramatic  Lungs: Clear bilaterally to auscultation and percussion. Heart: HRRR S1 S2, 1/6 SM  Pulses are 2+ & equal.            No carotid bruit. No JVD.  No abdominal bruits. No femoral bruits. Abdomen: Bowel sounds are positive, abdomen soft and non-tender without masses or                  Hernia's noted. Msk:  Back normal, normal gait. Normal strength and tone for age. Extremities: No clubbing, cyanosis or edema.  DP +1 Neuro: Alert and oriented X 3. Psych:  Good affect, responds appropriately  ASSESSMENT AND PLAN

## 2010-10-07 NOTE — Cardiovascular Report (Signed)
  NAMEMARCIANO, [name] NO.:  [id number]  MEDICAL RECORD NO.:  [id number]  LOCATION:  4711                         FACILITY:  [facility]  PHYSICIAN:  Marca Ancona, MD      DATE OF BIRTH:  1945/12/01  DATE OF PROCEDURE: DATE OF DISCHARGE:  09/24/2010                           CARDIAC CATHETERIZATION   PROCEDURES: 1. Left heart catheterization. 2. Coronary angiography.  INDICATION:  Cardiomyopathy of uncertain etiology, assess for CAD.  PROCEDURE NOTE:  The patient had Allen testing on his right wrist.  His Allen test was negative; therefore, we moved down to femoral access. The patient gave informed consent.  The right femoral area was sterilely prepped and draped.  Lidocaine 1% was used to locally anesthetize the right femoral area.  The right common femoral artery was entered using modified Seldinger technique and a 5-French arterial sheath was placed. The right coronary artery was engaged using the multipurpose catheter. Left ventricle was entered using multipurpose catheter.  The LAD was engaged using the JL-4 catheter and the circumflex was engaged using the JL-4 catheter.  There was no discrete left main.  The LAD and circumflex originated in close proximity to one another.  There were no complications.  FINDINGS: 1. Hemodynamics:  LV 155/18, aorta 159/73. 2. Left ventriculography:  This was not done given chronic kidney     disease.  We did use a total of 30 mL of contrast. 3. Right coronary artery:  The right coronary artery was dominant     vessel with 30% proximal RCA stenosis and 30% midvessel RCA     stenosis. 4. Left main:  There was no left main.  The LAD and the circumflex     came off the left cusp in close proximity to one other. 5. Left circumflex system:  There were 2 moderate-sized obtuse     marginals with minimal disease.  There was about 30% mid circumflex     stenosis. 6. LAD system:  There was a 30% proximal LAD stenosis in the  takeoff     of the small first diagonal.  There was a moderate-sized second     diagonal with 50-60% proximal vessel stenosis.  The distal LAD had     about 30% stenosis.  IMPRESSION:  Nonobstructive coronary disease.  We did not do left ventriculography because of chronic kidney disease.  We only used 30 mL of contrast in this case.  The patient will be okay to discharge post hydration.  We will continue his current medication.  He will follow up with Dr. Dietrich Pates in [place] who will get a BMET on him next week in [place].     Marca Ancona, MD     DM/MEDQ  D:  09/24/2010  T:  09/25/2010  Job:  [id number]  cc:   Gerrit Friends. Dietrich Pates, MD, [facility]  Electronically Signed by Marca Ancona MD on 10/07/2010 01:33:59 PM

## 2010-10-07 NOTE — Assessment & Plan Note (Signed)
Needs to be lower with systolic dysfunction.  See above note.  Labs are pending.

## 2010-10-07 NOTE — Assessment & Plan Note (Signed)
Currently he is stable from cardiac standpoint with the exception of HR and BP in patient with EF of 30%.  Will increase coreg to 6.25mg  BID, to titrate up as BP and HR tolerate do decrease cardiac demand.  I will check a BMET before restarting ACE to evaluate kidney fx.  He is to have close follow-up in 6 weeks to evaluate his status. He is reminded to weigh daily and not to drink alcohol.   Hopefully we can keep this patient out of the hospital with continued outpatient management and close follow-ups.

## 2010-10-07 NOTE — Patient Instructions (Signed)
Your physician has recommended you make the following change in your medication: increase Coreg to 6.25 mg twice daily  Your physician recommends that you return for lab work in: today  Your physician recommends that you schedule a follow-up appointment in: 6 weeks

## 2010-10-08 LAB — BASIC METABOLIC PANEL
BUN: 19 mg/dL (ref 6–23)
CO2: 25 mEq/L (ref 19–32)
Chloride: 102 mEq/L (ref 96–112)
Glucose, Bld: 109 mg/dL — ABNORMAL HIGH (ref 70–99)
Potassium: 4.4 mEq/L (ref 3.5–5.3)
Sodium: 137 mEq/L (ref 135–145)

## 2010-10-11 ENCOUNTER — Other Ambulatory Visit: Payer: Self-pay

## 2010-10-11 DIAGNOSIS — I10 Essential (primary) hypertension: Secondary | ICD-10-CM

## 2010-10-18 ENCOUNTER — Ambulatory Visit (INDEPENDENT_AMBULATORY_CARE_PROVIDER_SITE_OTHER): Payer: Medicare Other | Admitting: Gastroenterology

## 2010-10-18 ENCOUNTER — Encounter: Payer: Self-pay | Admitting: General Practice

## 2010-10-18 ENCOUNTER — Encounter: Payer: Self-pay | Admitting: Gastroenterology

## 2010-10-18 VITALS — BP 99/61 | HR 77 | Temp 97.0°F | Ht 75.0 in | Wt 164.8 lb

## 2010-10-18 DIAGNOSIS — D369 Benign neoplasm, unspecified site: Secondary | ICD-10-CM

## 2010-10-18 NOTE — Patient Instructions (Signed)
We have set you up for a colonoscopy. We may be proceeding with an upper endoscopy at that time as well, depending on stool results.  Please complete stool sample and return to our office.  Further recommendations to follow.

## 2010-10-18 NOTE — Progress Notes (Signed)
Referring Provider: [name], MD Primary Care Physician:  Greg Asa, MD, MD Primary Gastroenterologist:  Dr. Darrick Penna   Chief Complaint  Patient presents with  . Rectal Pain    HPI:   [name] is a pleasant 65 year old African-American male who presents today at the request of Dr. Gerda Diss for an updated colonoscopy. Last performed in 2006 with Dr. Katrinka Blazing: findings of anal fissure and an adenomatous polyp. He noted a change in his bowel habits a few weeks ago. States becoming more constipated, discomfort in rectum with straining, occasional itching, no burning. Given anusol cream, which has improved symptoms significantly. BM daily. No loose stools. Denies fecal incontinence. No abdominal pain. Denies N/V. Has decreased appetite, stating his "taste buds aren't working right". He reports weighing around 240 2 years ago. He is now 164. (-76 lbs). Pt denies seeing brbpr; however, he states his wife has supposedly seen small amount of hematochezia.    Recent inpatient labs July 2012 with Hgb 13.6, Hct 37.6.  Past Medical History  Diagnosis Date  . Hypertension   . COPD (chronic obstructive pulmonary disease)   . Hyperlipidemia   . Cardiomyopathy     EF of 30% per echo in June of 2012; admitted with congestive heart failure  . History of noncompliance with medical treatment   . Cerebrovascular disease 2011    h/o CVA/TIA  . Chronic kidney disease     creatinine-1.7 in 08/2010  . Alcohol abuse   . Tobacco abuse     40 pack years  . S/P cardiac cath 2012  . S/P colonoscopy 2006    Dr. Katrinka Blazing: anal fissure, sessile polyp: adenomatous     Past Surgical History  Procedure Date  . Abdominal aortic aneurysm repair w/ endoluminal graft   . Cardiac catheterization june 2012    non-obstructive    Current Outpatient Prescriptions  Medication Sig Dispense Refill  . amLODipine (NORVASC) 5 MG tablet Take 5 mg by mouth daily.        Marland Kitchen aspirin 81 MG tablet Take 81 mg by mouth daily.         Marland Kitchen buPROPion (WELLBUTRIN XL) 150 MG 24 hr tablet TAKE 1 TABLET BY MOUTH ONCE A DAY X 3 DAYS THEN INCREASE TO TWO TIMES A DAY  60 tablet  3  . carvedilol (COREG) 6.25 MG tablet Take 1 tablet (6.25 mg total) by mouth 2 (two) times daily with a meal.  60 tablet  3  . colesevelam (WELCHOL) 625 MG tablet Take 1,875 mg by mouth 2 (two) times daily with a meal.        . CRESTOR 40 MG tablet TAKE ONE TABLET BY MOUTH DAILY.  30 tablet  3  . dipyridamole-aspirin (AGGRENOX) 25-200 MG per 12 hr capsule Take 1 capsule by mouth 2 (two) times daily.        . folic acid (FOLVITE) 1 MG tablet Take 1 mg by mouth daily.        . furosemide (LASIX) 40 MG tablet Take 40 mg by mouth.        Marland Kitchen lisinopril (PRINIVIL,ZESTRIL) 20 MG tablet Take 20 mg by mouth daily.        Marland Kitchen NITROSTAT 0.4 MG SL tablet       . Omega-3 Fatty Acids 300 MG CAPS Take 1 capsule by mouth daily.        Marland Kitchen terazosin (HYTRIN) 10 MG capsule Take 10 mg by mouth at bedtime.  Allergies as of 10/18/2010  . (No Known Allergies)    Family History  Problem Relation Age of Onset  . Colon cancer Neg Hx     History   Social History  . Marital Status: Married    Spouse Name: N/A    Number of Children: N/A  . Years of Education: N/A   Occupational History  . Not on file.   Social History Main Topics  . Smoking status: Current Everyday Smoker -- 1.0 packs/day for 50 years    Types: Cigarettes  . Smokeless tobacco: Never Used  . Alcohol Use: 16.8 oz/week    28 Shots of liquor per week     quit a few weeks ago, prior to this would drink about 7 oz of liquor per day  . Drug Use: No  . Sexually Active: Not on file    Review of Systems: Gen: Denies any fever, chills, + loss of appetite, fatigue, + weight loss. CV: Denies chest pain, heart palpitations, syncope, peripheral edema. Resp: Denies shortness of breath with rest, cough, wheezing GI: Denies dysphagia or odynophagia. Denies hematemesis, fecal incontinence, or jaundice.  GU :  Denies urinary burning, urinary frequency, urinary incontinence.  MS: Denies joint pain, muscle weakness, cramps, limited movement Derm: Denies rash, itching, dry skin Psych: Denies depression, anxiety, confusion or memory loss  Heme: Denies bruising, bleeding, and enlarged lymph nodes.  Physical Exam: BP 99/61  Pulse 77  Temp(Src) 97 F (36.1 C) (Temporal)  Ht 6\' 3"  (1.905 m)  Wt 164 lb 12.8 oz (74.753 kg)  BMI 20.60 kg/m2 General:   Alert and oriented. Thin-appearing. Pleasant and cooperative.  Head:  Normocephalic and atraumatic. Eyes:  Conjunctiva pink, sclera clear, no icterus.    Ears:  Normal auditory acuity. Nose:  No deformity, discharge,  or lesions. Mouth:  No deformity or lesions Neck:  Supple, without mass or thyromegaly. Lungs:  Clear to auscultation bilaterally, without wheezing, rales, or rhonchi.  Heart:  S1, S2 present without murmurs noted.  Abdomen:  +BS, soft, thin, non-tender and non-distended. Without mass or HSM. No rebound or guarding. No hernias noted. Rectal:  No discomfort noted on exam. Poor sphincter tone, no masses appreciated, small amount of soft stool in rectal vault.  Msk:  Symmetrical without gross deformities. Normal posture. Extremities:  Without clubbing or edema. Neurologic:  Alert and  oriented x4;  grossly normal neurologically. Skin:  Intact, warm and dry without significant lesions or rashes Cervical Nodes:  No significant cervical adenopathy. Psych:  Alert and cooperative. Normal mood and affect.

## 2010-10-19 ENCOUNTER — Encounter: Payer: Self-pay | Admitting: Gastroenterology

## 2010-10-19 DIAGNOSIS — D369 Benign neoplasm, unspecified site: Secondary | ICD-10-CM | POA: Insufficient documentation

## 2010-10-19 NOTE — Assessment & Plan Note (Signed)
65 year old African-American male with hx of adenomatous polyp on colonoscopy by Dr. Katrinka Blazing in 2006, somewhat overdue for surveillance. Reportedly weighed in the 240 range 2 years ago, now 76 lb wt loss per his report. I do not have documentation to support this. Pt does appear thin, but not cachectic. He admits to poor appetite. Denies N/V or abdominal pain. Recent change in bowel habits, trending towards feeling more constipated. Noted anal itching/discomfort with straining. Improved with anusol cream from PCP. Rectal exam without mass noted. Wife reports possible low-volume hematochezia, pt unsure. Heme status unknown. Needs updated surveillance colonoscopy. Wt loss concerning, occult malignancy unable to be excluded. Without UGI symptoms except loss of appetite. Will obtain ifobt, possibly proceed with EGD at time of colonoscopy if necessary.   1. ifobt 2. Proceed with colonoscopy with Dr. Darrick Penna in the near future. Possible upper endoscopy. The risks, benefits, and alternatives have been discussed in detail with the patient. They state understanding and desire to proceed.

## 2010-10-19 NOTE — Progress Notes (Signed)
Cc to PCP 

## 2010-10-28 ENCOUNTER — Telehealth: Payer: Self-pay | Admitting: Gastroenterology

## 2010-10-28 NOTE — Telephone Encounter (Signed)
Please have pt hold  Aggrenox and ASA 5 days prior to procedure.   Also, pt needs to switch to enteric coated ASA 325 mg daily instead ofPt needs EcASA 325 mg daily instead. Aggrenox changed to ASA due to risk if bleeding.

## 2010-10-28 NOTE — Progress Notes (Signed)
Pt need TCS/?EGD for anemia. Hold Aggrenox and ASA 5 days prior to procedure. Pt needs EcASA 325 mg daily instead. Aggrenox changed to ASA due to risk if bleeding.

## 2010-11-02 ENCOUNTER — Encounter: Payer: Self-pay | Admitting: Gastroenterology

## 2010-11-02 NOTE — Telephone Encounter (Signed)
I close this encounter before sending. Sorry!  Please have pt hold Aggrenox and ASA 5 days prior to procedure.  Needs to switch to just ASA, enteric coated, 325 daily due to high risk of bleeding.

## 2010-11-02 NOTE — Telephone Encounter (Signed)
Opened in error

## 2010-11-03 ENCOUNTER — Ambulatory Visit (INDEPENDENT_AMBULATORY_CARE_PROVIDER_SITE_OTHER): Payer: Medicare Other | Admitting: Gastroenterology

## 2010-11-03 DIAGNOSIS — D649 Anemia, unspecified: Secondary | ICD-10-CM

## 2010-11-03 NOTE — Telephone Encounter (Signed)
LMOM to call.

## 2010-11-04 NOTE — Progress Notes (Signed)
Quick Note:  Pt informed ______ 

## 2010-11-04 NOTE — Telephone Encounter (Signed)
Pt informed to hold the ASA and Aggrenox 5 days prior to procedure. Also, told him to use the coated ASA.

## 2010-11-04 NOTE — Progress Notes (Signed)
Quick Note:  Noted. May inform pt. Proceed as planned. ______

## 2010-11-08 ENCOUNTER — Encounter (HOSPITAL_COMMUNITY): Payer: Self-pay

## 2010-11-08 ENCOUNTER — Encounter (HOSPITAL_COMMUNITY)
Admission: RE | Admit: 2010-11-08 | Discharge: 2010-11-08 | Disposition: A | Discharge status: Home / Self Care | Payer: Medicare Other | Source: Ambulatory Visit | Attending: Gastroenterology | Admitting: Gastroenterology

## 2010-11-08 HISTORY — DX: Cerebral infarction, unspecified: I63.9

## 2010-11-08 LAB — BASIC METABOLIC PANEL
BUN: 21 mg/dL (ref 6–23)
Creatinine, Ser: 2 mg/dL — ABNORMAL HIGH (ref 0.50–1.35)
GFR calc Af Amer: 41 mL/min — ABNORMAL LOW (ref >60)
GFR calc non Af Amer: 34 mL/min — ABNORMAL LOW (ref >60)
Glucose, Bld: 78 mg/dL (ref 70–99)
Potassium: 4.2 mEq/L (ref 3.5–5.1)

## 2010-11-08 LAB — CBC
HCT: 37.2 % — ABNORMAL LOW (ref 39.0–52.0)
Hemoglobin: 13 g/dL (ref 13.0–17.0)
MCH: 33.1 pg (ref 26.0–34.0)
MCHC: 34.9 g/dL (ref 30.0–36.0)
MCV: 94.7 fL (ref 78.0–100.0)
RDW: 12.7 % (ref 11.5–15.5)

## 2010-11-08 MED ORDER — LACTATED RINGERS IV SOLN: INTRAVENOUS | Status: DC

## 2010-11-08 NOTE — Patient Instructions (Signed)
20 [name]  11/08/2010   Your procedure is scheduled on:  11/15/2010  Report to [facility] at  615  AM.  Call this number if you have problems the morning of surgery: [phone]   Remember:   Do not eat food:After Midnight.  Do not drink clear liquids: After Midnight.  Take these medicines the morning of surgery with A SIP OF WATER: Amlodipine, carvedilol,lisinopril   Do not wear jewelry, make-up or nail polish.  Do not wear lotions, powders, or perfumes. You may wear deodorant.  Do not shave 48 hours prior to surgery.  Do not bring valuables to the hospital.  Contacts, dentures or bridgework may not be worn into surgery.  Leave suitcase in the car. After surgery it may be brought to your room.  For patients admitted to the hospital, checkout time is 11:00 AM the day of discharge.   Patients discharged the day of surgery will not be allowed to drive home.  Name and phone number of your driver: familt  Special Instructions: N/A   Please read over the following fact sheets that you were given: Pain Booklet, Surgical Site Infection Prevention, Anesthesia Post-op Instructions and Care and Recovery After Surgery PATIENT INSTRUCTIONS POST-ANESTHESIA  IMMEDIATELY FOLLOWING SURGERY:  Do not drive or operate machinery for the first twenty four hours after surgery.  Do not make any important decisions for twenty four hours after surgery or while taking narcotic pain medications or sedatives.  If you develop intractable nausea and vomiting or a severe headache please notify your doctor immediately.  FOLLOW-UP:  Please make an appointment with your surgeon as instructed. You do not need to follow up with anesthesia unless specifically instructed to do so.  WOUND CARE INSTRUCTIONS (if applicable):  Keep a dry clean dressing on the anesthesia/puncture wound site if there is drainage.  Once the wound has quit draining you may leave it open to air.  Generally you should leave the bandage intact  for twenty four hours unless there is drainage.  If the epidural site drains for more than 36-48 hours please call the anesthesia department.  QUESTIONS?:  Please feel free to call your physician or the hospital operator if you have any questions, and they will be happy to assist you.     [facility] Anesthesia Department [address] [place] [phone]

## 2010-11-09 NOTE — Pre-Procedure Instructions (Signed)
Labs shown to Dr Jayme Cloud- creat 2.00 and gfr 41. No new orders given.

## 2010-11-15 ENCOUNTER — Encounter (HOSPITAL_COMMUNITY): Payer: Self-pay | Admitting: Anesthesiology

## 2010-11-15 ENCOUNTER — Other Ambulatory Visit: Payer: Self-pay | Admitting: Gastroenterology

## 2010-11-15 ENCOUNTER — Encounter: Payer: Medicare Other | Admitting: Gastroenterology

## 2010-11-15 ENCOUNTER — Ambulatory Visit (HOSPITAL_COMMUNITY)
Admission: RE | Admit: 2010-11-15 | Discharge: 2010-11-15 | Disposition: A | Discharge status: Home / Self Care | Payer: Medicare Other | Source: Ambulatory Visit | Attending: Gastroenterology | Admitting: Gastroenterology

## 2010-11-15 ENCOUNTER — Encounter (HOSPITAL_COMMUNITY): Payer: Self-pay | Admitting: *Deleted

## 2010-11-15 ENCOUNTER — Encounter (HOSPITAL_COMMUNITY)
Admission: RE | Disposition: A | Discharge status: Home / Self Care | Payer: Self-pay | Source: Ambulatory Visit | Attending: Gastroenterology

## 2010-11-15 ENCOUNTER — Ambulatory Visit (HOSPITAL_COMMUNITY): Payer: Medicare Other | Admitting: Anesthesiology

## 2010-11-15 DIAGNOSIS — K222 Esophageal obstruction: Secondary | ICD-10-CM | POA: Insufficient documentation

## 2010-11-15 DIAGNOSIS — Z7982 Long term (current) use of aspirin: Secondary | ICD-10-CM | POA: Insufficient documentation

## 2010-11-15 DIAGNOSIS — K2970 Gastritis, unspecified, without bleeding: Secondary | ICD-10-CM

## 2010-11-15 DIAGNOSIS — K2940 Chronic atrophic gastritis without bleeding: Secondary | ICD-10-CM | POA: Insufficient documentation

## 2010-11-15 DIAGNOSIS — Z01812 Encounter for preprocedural laboratory examination: Secondary | ICD-10-CM | POA: Insufficient documentation

## 2010-11-15 DIAGNOSIS — I10 Essential (primary) hypertension: Secondary | ICD-10-CM | POA: Insufficient documentation

## 2010-11-15 DIAGNOSIS — D509 Iron deficiency anemia, unspecified: Secondary | ICD-10-CM | POA: Insufficient documentation

## 2010-11-15 DIAGNOSIS — K625 Hemorrhage of anus and rectum: Secondary | ICD-10-CM

## 2010-11-15 DIAGNOSIS — J4489 Other specified chronic obstructive pulmonary disease: Secondary | ICD-10-CM | POA: Insufficient documentation

## 2010-11-15 DIAGNOSIS — Z79899 Other long term (current) drug therapy: Secondary | ICD-10-CM | POA: Insufficient documentation

## 2010-11-15 DIAGNOSIS — K2990 Gastroduodenitis, unspecified, without bleeding: Secondary | ICD-10-CM

## 2010-11-15 DIAGNOSIS — J449 Chronic obstructive pulmonary disease, unspecified: Secondary | ICD-10-CM | POA: Insufficient documentation

## 2010-11-15 DIAGNOSIS — D649 Anemia, unspecified: Secondary | ICD-10-CM

## 2010-11-15 DIAGNOSIS — K449 Diaphragmatic hernia without obstruction or gangrene: Secondary | ICD-10-CM

## 2010-11-15 DIAGNOSIS — K648 Other hemorrhoids: Secondary | ICD-10-CM | POA: Insufficient documentation

## 2010-11-15 DIAGNOSIS — A048 Other specified bacterial intestinal infections: Secondary | ICD-10-CM | POA: Insufficient documentation

## 2010-11-15 HISTORY — PX: FLEXIBLE SIGMOIDOSCOPY: SHX5431

## 2010-11-15 HISTORY — PX: ESOPHAGOGASTRODUODENOSCOPY: SHX5428

## 2010-11-15 SURGERY — EGD (ESOPHAGOGASTRODUODENOSCOPY)
Anesthesia: Monitor Anesthesia Care

## 2010-11-15 MED ORDER — FENTANYL CITRATE 0.05 MG/ML IJ SOLN
INTRAMUSCULAR | Status: DC | PRN
  Administered 2010-11-15 (×2): 50 ug via INTRAVENOUS

## 2010-11-15 MED ORDER — PROPOFOL 10 MG/ML IV EMUL
INTRAVENOUS | Status: AC
  Filled 2010-11-15: qty 20

## 2010-11-15 MED ORDER — MIDAZOLAM HCL 2 MG/2ML IJ SOLN
1.0000 mg | INTRAMUSCULAR | Status: DC | PRN
  Administered 2010-11-15: 2 mg via INTRAVENOUS

## 2010-11-15 MED ORDER — STERILE WATER FOR IRRIGATION IR SOLN
Status: DC | PRN
  Administered 2010-11-15: 08:00:00

## 2010-11-15 MED ORDER — ONDANSETRON HCL 4 MG/2ML IJ SOLN
4.0000 mg | Freq: Once | INTRAMUSCULAR | Status: AC
  Administered 2010-11-15: 4 mg via INTRAVENOUS

## 2010-11-15 MED ORDER — GLYCOPYRROLATE 0.2 MG/ML IJ SOLN
INTRAMUSCULAR | Status: AC
  Filled 2010-11-15: qty 1

## 2010-11-15 MED ORDER — LACTATED RINGERS IV SOLN
INTRAVENOUS | Status: DC
  Administered 2010-11-15: 1000 mL via INTRAVENOUS

## 2010-11-15 MED ORDER — MIDAZOLAM HCL 2 MG/2ML IJ SOLN
INTRAMUSCULAR | Status: AC
  Filled 2010-11-15: qty 2

## 2010-11-15 MED ORDER — OMEPRAZOLE 20 MG PO CPDR: DELAYED_RELEASE_CAPSULE | ORAL | Status: DC

## 2010-11-15 MED ORDER — BUTAMBEN-TETRACAINE-BENZOCAINE 2-2-14 % EX AERO
1.0000 | INHALATION_SPRAY | Freq: Once | CUTANEOUS | Status: AC
  Administered 2010-11-15: 2 via TOPICAL
  Filled 2010-11-15: qty 56

## 2010-11-15 MED ORDER — FENTANYL CITRATE 0.05 MG/ML IJ SOLN
INTRAMUSCULAR | Status: AC
  Filled 2010-11-15: qty 2

## 2010-11-15 MED ORDER — GLYCOPYRROLATE 0.2 MG/ML IJ SOLN
0.2000 mg | Freq: Once | INTRAMUSCULAR | Status: AC
  Administered 2010-11-15: 0.2 mg via INTRAVENOUS

## 2010-11-15 MED ORDER — LIDOCAINE HCL (PF) 1 % IJ SOLN
INTRAMUSCULAR | Status: AC
  Filled 2010-11-15: qty 5

## 2010-11-15 MED ORDER — PEG 3350-KCL-NA BICARB-NACL 420 G PO SOLR: ORAL | Status: DC

## 2010-11-15 MED ORDER — ONDANSETRON HCL 4 MG/2ML IJ SOLN
INTRAMUSCULAR | Status: AC
  Filled 2010-11-15: qty 2

## 2010-11-15 MED ORDER — MIDAZOLAM HCL 5 MG/5ML IJ SOLN
INTRAMUSCULAR | Status: DC | PRN
  Administered 2010-11-15: 2 mg via INTRAVENOUS

## 2010-11-15 MED ORDER — PROPOFOL 10 MG/ML IV EMUL
INTRAVENOUS | Status: DC | PRN
  Administered 2010-11-15: 50 ug/kg/min via INTRAVENOUS

## 2010-11-15 MED ORDER — MIDAZOLAM HCL 2 MG/2ML IJ SOLN
INTRAMUSCULAR | Status: AC
  Administered 2010-11-15: 2 mg via INTRAVENOUS
  Filled 2010-11-15: qty 2

## 2010-11-15 SURGICAL SUPPLY — 25 items
BLOCK BITE 60FR ADLT L/F BLUE (MISCELLANEOUS) ×2 IMPLANT
ELECT REM PT RETURN 9FT ADLT (ELECTROSURGICAL)
ELECTRODE REM PT RTRN 9FT ADLT (ELECTROSURGICAL) IMPLANT
FCP BXJMBJMB 240X2.8X (CUTTING FORCEPS)
FLOOR PAD 36X40 (MISCELLANEOUS) ×2
FORCEP RJ3 GP 1.8X160 W-NEEDLE (CUTTING FORCEPS) IMPLANT
FORCEPS BIOP RAD 4 LRG CAP 4 (CUTTING FORCEPS) ×1 IMPLANT
FORCEPS BIOP RJ4 240 W/NDL (CUTTING FORCEPS)
FORCEPS BXJMBJMB 240X2.8X (CUTTING FORCEPS) IMPLANT
INJECTOR/SNARE I SNARE (MISCELLANEOUS) IMPLANT
LUBRICANT JELLY 4.5OZ STERILE (MISCELLANEOUS) ×1 IMPLANT
NDL SCLEROTHERAPY 25GX240 (NEEDLE) IMPLANT
NEEDLE SCLEROTHERAPY 25GX240 (NEEDLE) IMPLANT
PAD FLOOR 36X40 (MISCELLANEOUS) ×1 IMPLANT
PROBE APC STR FIRE (PROBE) IMPLANT
PROBE INJECTION GOLD (MISCELLANEOUS)
PROBE INJECTION GOLD 7FR (MISCELLANEOUS) IMPLANT
SNARE ROTATE MED OVAL 20MM (MISCELLANEOUS) IMPLANT
SNARE SHORT THROW 13M SML OVAL (MISCELLANEOUS) IMPLANT
SYR 50ML LL SCALE MARK (SYRINGE) ×1 IMPLANT
TRAP SPECIMEN MUCOUS 40CC (MISCELLANEOUS) IMPLANT
TUBING ENDO SMARTCAP (MISCELLANEOUS) ×2 IMPLANT
TUBING ENDO SMARTCAP PENTAX (MISCELLANEOUS) ×4 IMPLANT
TUBING IRRIGATION ENDOGATOR (MISCELLANEOUS) ×2 IMPLANT
WATER STERILE IRR 1000ML POUR (IV SOLUTION) ×1 IMPLANT

## 2010-11-15 NOTE — Transfer of Care (Signed)
  Anesthesia Post-op Note  Patient: [name]  Procedure(s) Performed:  ESOPHAGOGASTRODUODENOSCOPY (EGD) - with propofol sedation; procedure start @ 0813; FLEXIBLE SIGMOIDOSCOPY - with propofol sedation; ended @ 0808  Patient Location: PACU  Anesthesia Type: MAC  Level of Consciousness: oriented and sedated  Airway and Oxygen Therapy: Patient Spontanous Breathing and Patient connected to face mask oxygen  Post-op Pain: none  Post-op Assessment: Post-op Vital signs reviewed, Patient's Cardiovascular Status Stable and Respiratory Function Stable  Post-op Vital Signs: Reviewed and stable  Complications: No apparent anesthesia complications

## 2010-11-15 NOTE — Anesthesia Preprocedure Evaluation (Addendum)
Anesthesia Evaluation  Name, MR# and DOB Patient awake  General Assessment Comment  Reviewed: Allergy & Precautions, H&P , NPO status , Patient's Chart, lab work & pertinent test results  History of Anesthesia Complications Negative for: history of anesthetic complications  Airway Mallampati: II  Neck ROM: Full    Dental  (+) Edentulous Upper and Edentulous Lower   Pulmonary  COPD      Cardiovascular hypertension, Pt. on medications + Peripheral Vascular Disease and +CHF Regular Normal    Neuro/Psych    (+) PSYCHIATRIC DISORDERS,  CVA, No Residual Symptoms   GI/Hepatic/Renal   Endo/Other    Abdominal   Musculoskeletal   Hematology   Peds  Reproductive/Obstetrics    Anesthesia Other Findings             Anesthesia Physical Anesthesia Plan  ASA: III  Anesthesia Plan: MAC   Post-op Pain Management:    Induction:   Airway Management Planned: Simple Face Mask  Additional Equipment:   Intra-op Plan:   Post-operative Plan:   Informed Consent: I have reviewed the patients History and Physical, chart, labs and discussed the procedure including the risks, benefits and alternatives for the proposed anesthesia with the patient or authorized representative who has indicated his/her understanding and acceptance.     Plan Discussed with:   Anesthesia Plan Comments:         Anesthesia Quick Evaluation

## 2010-11-15 NOTE — Interval H&P Note (Signed)
History and Physical Interval Note:   11/15/2010   7:33 AM   [name]  has presented today for surgery, with the diagnosis of surveillance, wt.loss  The various methods of treatment have been discussed with the patient and family. After consideration of risks, benefits and other options for treatment, the patient has consented to  Procedure(s): COLONOSCOPY ESOPHAGOGASTRODUODENOSCOPY (EGD) as a surgical intervention .  I have reviewed the patients' chart and labs.  Questions were answered to the patient's satisfaction.     [name]  MD

## 2010-11-15 NOTE — Anesthesia Postprocedure Evaluation (Signed)
Anesthesia Post Note  Patient: [name]  Procedure(s) Performed:  ESOPHAGOGASTRODUODENOSCOPY (EGD) - with propofol sedation; procedure start @ 0813; FLEXIBLE SIGMOIDOSCOPY - with propofol sedation; ended @ 0808  Anesthesia type: MAC  Patient location: PACU  Post pain: Pain level controlled  Post assessment: Post-op Vital signs reviewed, Patient's Cardiovascular Status Stable and Respiratory Function Stable  Last Vitals:  Filed Vitals:   11/15/10 0826  BP: 118/74  Pulse: 69  Temp: 97.6 F (36.4 C)  Resp: 20    Post vital signs: Reviewed and stable  Level of consciousness: awake and sedated  Complications: No apparent anesthesia complications

## 2010-11-15 NOTE — H&P (Signed)
Reason for Visit     Rectal Pain        Current Vitals       Recorded User        10/18/2010  2:04 PM  Ginger L Walker, NT           BP Pulse Temp (Src) Resp Ht Wt    99/61  77  97 F (36.1 C) (Temporal)  N/A  6\' 3"  (1.905 m)  164 lb 12.8 oz (74.753 kg)       BMI SpO2 PF    20.60 kg/m2  N/A  N/A          Progress Notes     Gerrit Halls, NP  10/19/2010  1:27 PM  Signed   Referring Provider: [name], MD Primary Care Physician:  Harlow Asa, MD, MD Primary Gastroenterologist:  Dr. Darrick Penna     Chief Complaint   Patient presents with   .  Rectal Pain      HPI:    [name] is a pleasant 65 year old African-American male who presents today at the request of Dr. Gerda Diss for an updated colonoscopy. Last performed in 2006 with Dr. Katrinka Blazing: findings of anal fissure and an adenomatous polyp. He noted a change in his bowel habits a few weeks ago. States becoming more constipated, discomfort in rectum with straining, occasional itching, no burning. Given anusol cream, which has improved symptoms significantly. BM daily. No loose stools. Denies fecal incontinence. No abdominal pain. Denies N/V. Has decreased appetite, stating his "taste buds aren't working right". He reports weighing around 240 2 years ago. He is now 164. (-76 lbs). Pt denies seeing brbpr; however, he states his wife has supposedly seen small amount of hematochezia.      Recent inpatient labs July 2012 with Hgb 13.6, Hct 37.6.    Past Medical History   Diagnosis  Date   .  Hypertension     .  COPD (chronic obstructive pulmonary disease)     .  Hyperlipidemia     .  Cardiomyopathy         EF of 30% per echo in June of 2012; admitted with congestive heart failure   .  History of noncompliance with medical treatment     .  Cerebrovascular disease  2011       h/o CVA/TIA   .  Chronic kidney disease         creatinine-1.7 in 08/2010   .  Alcohol abuse     .  Tobacco abuse         40 pack years   .  S/P  cardiac cath  2012   .  S/P colonoscopy  2006       Dr. Katrinka Blazing: anal fissure, sessile polyp: adenomatous        Past Surgical History   Procedure  Date   .  Abdominal aortic aneurysm repair w/ endoluminal graft     .  Cardiac catheterization  june 2012       non-obstructive       Current Outpatient Prescriptions   Medication  Sig  Dispense  Refill   .  amLODipine (NORVASC) 5 MG tablet  Take 5 mg by mouth daily.           Marland Kitchen  aspirin 81 MG tablet  Take 81 mg by mouth daily.           Marland Kitchen  buPROPion (WELLBUTRIN XL) 150 MG 24 hr tablet  TAKE 1 TABLET BY MOUTH ONCE A DAY X 3 DAYS THEN INCREASE TO TWO TIMES A DAY   60 tablet   3   .  carvedilol (COREG) 6.25 MG tablet  Take 1 tablet (6.25 mg total) by mouth 2 (two) times daily with a meal.   60 tablet   3   .  colesevelam (WELCHOL) 625 MG tablet  Take 1,875 mg by mouth 2 (two) times daily with a meal.           .  CRESTOR 40 MG tablet  TAKE ONE TABLET BY MOUTH DAILY.   30 tablet   3   .  dipyridamole-aspirin (AGGRENOX) 25-200 MG per 12 hr capsule  Take 1 capsule by mouth 2 (two) times daily.           .  folic acid (FOLVITE) 1 MG tablet  Take 1 mg by mouth daily.           .  furosemide (LASIX) 40 MG tablet  Take 40 mg by mouth.           Marland Kitchen  lisinopril (PRINIVIL,ZESTRIL) 20 MG tablet  Take 20 mg by mouth daily.           Marland Kitchen  NITROSTAT 0.4 MG SL tablet           .  Omega-3 Fatty Acids 300 MG CAPS  Take 1 capsule by mouth daily.           Marland Kitchen  terazosin (HYTRIN) 10 MG capsule  Take 10 mg by mouth at bedtime.               Allergies as of 10/18/2010   .  (No Known Allergies)       Family History   Problem  Relation  Age of Onset   .  Colon cancer  Neg Hx         History       Social History   .  Marital Status:  Married       Spouse Name:  N/A       Number of Children:  N/A   .  Years of Education:  N/A       Occupational History   .  Not on file.       Social History Main Topics   .  Smoking status:  Current Everyday Smoker --  1.0 packs/day for 50 years       Types:  Cigarettes   .  Smokeless tobacco:  Never Used   .  Alcohol Use:  16.8 oz/week       28 Shots of liquor per week         quit a few weeks ago, prior to this would drink about 7 oz of liquor per day   .  Drug Use:  No   .  Sexually Active:  Not on file        Review of Systems: Gen: Denies any fever, chills, + loss of appetite, fatigue, + weight loss. CV: Denies chest pain, heart palpitations, syncope, peripheral edema. Resp: Denies shortness of breath with rest, cough, wheezing GI: Denies dysphagia or odynophagia. Denies hematemesis, fecal incontinence, or jaundice.   GU : Denies urinary burning, urinary frequency, urinary incontinence.   MS: Denies joint pain, muscle weakness, cramps, limited movement Derm: Denies rash, itching, dry skin Psych: Denies depression, anxiety, confusion or memory loss   Heme: Denies bruising, bleeding, and enlarged lymph nodes.  Physical Exam: BP 99/61  Pulse 77  Temp(Src) 97 F (36.1 C) (Temporal)  Ht 6\' 3"  (1.905 m)  Wt 164 lb 12.8 oz (74.753 kg)  BMI 20.60 kg/m2 General:   Alert and oriented. Thin-appearing. Pleasant and cooperative.   Head:  Normocephalic and atraumatic. Eyes:  Conjunctiva pink, sclera clear, no icterus.     Ears:  Normal auditory acuity. Nose:  No deformity, discharge,  or lesions. Mouth:  No deformity or lesions Neck:  Supple, without mass or thyromegaly. Lungs:  Clear to auscultation bilaterally, without wheezing, rales, or rhonchi.   Heart:  S1, S2 present without murmurs noted.   Abdomen:  +BS, soft, thin, non-tender and non-distended. Without mass or HSM. No rebound or guarding. No hernias noted. Rectal:  No discomfort noted on exam. Poor sphincter tone, no masses appreciated, small amount of soft stool in rectal vault.   Msk:  Symmetrical without gross deformities. Normal posture. Extremities:  Without clubbing or edema. Neurologic:  Alert and  oriented x4;  grossly normal  neurologically. Skin:  Intact, warm and dry without significant lesions or rashes Cervical Nodes:  No significant cervical adenopathy. Psych:  Alert and cooperative. Normal mood and affect.       [name]  10/19/2010  3:42 PM  Signed Cc to PCP  [name], MD  10/28/2010 11:35 AM  Signed Pt need TCS/?EGD for anemia. Hold Aggrenox and ASA 5 days prior to procedure. Pt needs EcASA 325 mg daily instead. Aggrenox changed to ASA due to risk if bleeding.        Adenomatous polyp - Gerrit Halls, NP  10/19/2010  1:27 PM  Signed 65 year old African-American male with hx of adenomatous polyp on colonoscopy by Dr. Katrinka Blazing in 2006, somewhat overdue for surveillance. Reportedly weighed in the 240 range 2 years ago, now 76 lb wt loss per his report. I do not have documentation to support this. Pt does appear thin, but not cachectic. He admits to poor appetite. Denies N/V or abdominal pain. Recent change in bowel habits, trending towards feeling more constipated. Noted anal itching/discomfort with straining. Improved with anusol cream from PCP. Rectal exam without mass noted. Wife reports possible low-volume hematochezia, pt unsure. Heme status unknown. Needs updated surveillance colonoscopy. Wt loss concerning, occult malignancy unable to be excluded. Without UGI symptoms except loss of appetite. Will obtain ifobt, possibly proceed with EGD at time of colonoscopy if necessary.    1. ifobt 2. Proceed with colonoscopy with Dr. Darrick Penna in the near future. Possible upper endoscopy. The risks, benefits, and alternatives have been discussed in detail with the patient. They state understanding and desire to proceed.

## 2010-11-16 ENCOUNTER — Telehealth: Payer: Self-pay | Admitting: Gastroenterology

## 2010-11-16 NOTE — Telephone Encounter (Signed)
PLEASE CALL PT. HIS stomach Bx showed H. Pylori infection. He needs AMOXICILLIN 500 mg 2 po BID for 10 days and Biaxin 500 mg po bid for 10 days, #qs, rfx0. She needs Omeprazole 20 mg BID for 10 days then 1 po dailY for 3 mos, #30, rfx3. Med side effects include NVD, abd pain, and metallic taste. COLONOSCOPY IN SEP 2012.

## 2010-11-16 NOTE — Telephone Encounter (Signed)
Reviewed med list with Lorin Picket. NO DRUG INTERACTION EXCEPT BETWEEN WELLBUTRIN AND COREG

## 2010-11-16 NOTE — Progress Notes (Signed)
error 

## 2010-11-17 NOTE — Telephone Encounter (Signed)
Results Cc to PCP  

## 2010-11-18 ENCOUNTER — Encounter: Payer: Self-pay | Admitting: Cardiology

## 2010-11-18 ENCOUNTER — Ambulatory Visit (INDEPENDENT_AMBULATORY_CARE_PROVIDER_SITE_OTHER): Payer: Medicare Other | Admitting: Cardiology

## 2010-11-18 DIAGNOSIS — Z9119 Patient's noncompliance with other medical treatment and regimen: Secondary | ICD-10-CM

## 2010-11-18 DIAGNOSIS — I428 Other cardiomyopathies: Secondary | ICD-10-CM

## 2010-11-18 DIAGNOSIS — E785 Hyperlipidemia, unspecified: Secondary | ICD-10-CM

## 2010-11-18 DIAGNOSIS — Z720 Tobacco use: Secondary | ICD-10-CM

## 2010-11-18 DIAGNOSIS — F1010 Alcohol abuse, uncomplicated: Secondary | ICD-10-CM

## 2010-11-18 DIAGNOSIS — J449 Chronic obstructive pulmonary disease, unspecified: Secondary | ICD-10-CM

## 2010-11-18 DIAGNOSIS — I679 Cerebrovascular disease, unspecified: Secondary | ICD-10-CM

## 2010-11-18 DIAGNOSIS — I10 Essential (primary) hypertension: Secondary | ICD-10-CM

## 2010-11-18 DIAGNOSIS — Z91199 Patient's noncompliance with other medical treatment and regimen due to unspecified reason: Secondary | ICD-10-CM

## 2010-11-18 DIAGNOSIS — D369 Benign neoplasm, unspecified site: Secondary | ICD-10-CM

## 2010-11-18 DIAGNOSIS — Z9889 Other specified postprocedural states: Secondary | ICD-10-CM

## 2010-11-18 DIAGNOSIS — N189 Chronic kidney disease, unspecified: Secondary | ICD-10-CM

## 2010-11-18 MED ORDER — COLESEVELAM HCL 625 MG PO TABS: 1250.0000 mg | ORAL_TABLET | Freq: Three times a day (TID) | ORAL | Status: DC

## 2010-11-18 MED ORDER — EZETIMIBE 10 MG PO TABS: 10.0000 mg | ORAL_TABLET | Freq: Every day | ORAL | Status: DC

## 2010-11-18 NOTE — Assessment & Plan Note (Addendum)
Despite ejection fraction of 30% a few months ago, CHF is well compensated with fairly modest medication.  Patient complains of dizziness and is not inclined to take additional medications, which would further depress blood pressure.  Medications more appropriate for treatment of cardiomyopathy could be utilized, most notably substituting an ACE inhibitor for amlodipine and/or terazosin.  The former will be undertaken, and patient advised to monitor blood pressure at home.  Serial assessments of renal function and potassium will be necessary in light of the patient's baseline moderate renal insufficiency.

## 2010-11-18 NOTE — Assessment & Plan Note (Signed)
Blood pressure control is adequate.  Although there was no orthostatic change in blood pressure when assessed at this office visit, patient is convinced that his antihypertensive medications are causing intermittent dizziness.  I doubt that this is the case, but have advised him to carefully monitor blood pressure at home and to hold appropriate medications if low blood pressure is associated with symptoms.

## 2010-11-18 NOTE — Progress Notes (Signed)
HPI : Mr. Waynick returns to the office as scheduled for continued assessment and treatment of peripheral vascular disease, hypertension and a history of congestive heart failure with normal left ventricular systolic function.  Since his last visit, he has been generally well.  He notes intermittent dizziness occurring a few times per week.  There appears to be an orthostatic component.  He monitors blood pressure at home with generally adequate control.  On his worst days, systolics are in the 140s, but decrease intermittently to values below 100.  He has not been hospitalized, required evaluation in the emergency department, nor developed new medical problems. Vascular surgery is monitoring him following stent graft placement for an abdominal aortic aneurysm.   Unfortunately, he continues to smoke cigarettes, albeit at a reduced rate of 0.5 packs per day.  Current Outpatient Prescriptions on File Prior to Visit  Medication Sig Dispense Refill  . amLODipine (NORVASC) 5 MG tablet Take 5 mg by mouth daily.       Marland Kitchen buPROPion (WELLBUTRIN XL) 150 MG 24 hr tablet TAKE 1 TABLET BY MOUTH ONCE A DAY X 3 DAYS THEN INCREASE TO TWO TIMES A DAY  60 tablet  3  . carvedilol (COREG) 6.25 MG tablet Take 1 tablet (6.25 mg total) by mouth 2 (two) times daily with a meal.  60 tablet  3  . colesevelam (WELCHOL) 625 MG tablet Take 1,875 mg by mouth 2 (two) times daily with a meal.       . CRESTOR 40 MG tablet TAKE ONE TABLET BY MOUTH DAILY.  30 tablet  3  . dipyridamole-aspirin (AGGRENOX) 25-200 MG per 12 hr capsule Take 1 capsule by mouth 2 (two) times daily.        . fluticasone (CUTIVATE) 0.05 % cream Apply 1 application topically 2 (two) times daily. For psoriasis       . folic acid (FOLVITE) 1 MG tablet Take 1 mg by mouth daily.        . furosemide (LASIX) 40 MG tablet Take 40 mg by mouth daily.       Boris Lown Oil 300 MG CAPS Take 1 capsule by mouth daily.        Marland Kitchen NITROSTAT 0.4 MG SL tablet Place 0.4 mg under the  tongue every 5 (five) minutes as needed. For chest pain      . omeprazole (PRILOSEC) 20 MG capsule 1 po every morning  30 capsule  5  . terazosin (HYTRIN) 10 MG capsule Take 10 mg by mouth at bedtime.           No Known Allergies    Past medical history, social history, and family history reviewed and updated.  ROS: Denies chest pain, exertional dyspnea, orthopnea, PND or pedal edema.  PHYSICAL EXAM: BP 159/79  Pulse 60  Resp 18  Ht 6\' 3"  (1.905 m)  Wt 168 lb 6.4 oz (76.386 kg)  BMI 21.05 kg/m2  SpO2 99% ; no orthostatic change in BP General-Well developed; no acute distress Body habitus-lanky Neck-No JVD; no carotid bruits Lungs-clear lung fields; resonant to percussion Cardiovascular-normal PMI; normal S1; increased A2; modest systolic ejection murmur Abdomen-normal bowel sounds; soft and non-tender without masses or organomegaly; liver edge palpable 2 cm below the right costal margin Musculoskeletal-No deformities, no cyanosis or clubbing Neurologic-Normal cranial nerves; symmetric strength and tone Skin-Warm, no significant lesions Extremities-distal pulses intact; no edema  ASSESSMENT AND PLAN:

## 2010-11-18 NOTE — Assessment & Plan Note (Signed)
He is followed by Dr. Myra Gianotti with an appointment scheduled for early next year.

## 2010-11-18 NOTE — Telephone Encounter (Signed)
Pt informed. Rx's called to [facility] in [place], [place].

## 2010-11-18 NOTE — Assessment & Plan Note (Signed)
Polypectomy in 2006

## 2010-11-18 NOTE — Patient Instructions (Signed)
Your physician has recommended you make the following change in your medication: increase Welchol to 2 tablets three times daily, start taking Ezetimibe 10 mg daily. PLEASE DO NOT TAKE AMLODIPINE AND HYTRIN WHEN YOU ARE DIZZY.  Your physician discussed the hazards of tobacco use. Tobacco use cessation is recommended and techniques and options to help you quit were discussed.  Your physician recommends that you return for lab work in: 1 month  Your physician recommends that you schedule a follow-up appointment in: 7 months

## 2010-11-18 NOTE — Assessment & Plan Note (Signed)
Complete discontinuation of tobacco use as advised.  Patient agrees to make the attempt.

## 2010-11-18 NOTE — Assessment & Plan Note (Addendum)
Renal dysfunction has been somewhat progressive and will require ongoing monitoring.  Metabolic profiles will be obtained in 1 and 3 months.  I will reassess this nice gentleman in 7 months.

## 2010-11-18 NOTE — Telephone Encounter (Signed)
Pt scheduled for 12/13/10- Instructions mailed

## 2010-11-18 NOTE — Assessment & Plan Note (Signed)
Although HDL is fairly high, LDL is substantially elevated as well.  Dose of statin is maximized.  WelChol will be increased to 1250 mg t.i.d and ezetimibe added at a dose of 10 mg q.d.  Lipids will be reassessed in one month.

## 2010-11-18 NOTE — Assessment & Plan Note (Signed)
Abstinent for at least 10 years.

## 2010-11-19 ENCOUNTER — Encounter (HOSPITAL_COMMUNITY): Payer: Self-pay | Admitting: Gastroenterology

## 2010-11-19 MED ORDER — LISINOPRIL 10 MG PO TABS: 10.0000 mg | ORAL_TABLET | Freq: Every day | ORAL | Status: DC

## 2010-11-24 ENCOUNTER — Telehealth: Payer: Self-pay

## 2010-12-06 ENCOUNTER — Encounter (HOSPITAL_COMMUNITY): Payer: Self-pay

## 2010-12-06 ENCOUNTER — Encounter (HOSPITAL_COMMUNITY)
Admit: 2010-12-06 | Discharge: 2010-12-06 | Disposition: A | Discharge status: Home / Self Care | Payer: PRIVATE HEALTH INSURANCE | Attending: Gastroenterology | Admitting: Gastroenterology

## 2010-12-06 LAB — CBC
MCHC: 34.8 g/dL (ref 30.0–36.0)
Platelets: 189 10*3/uL (ref 150–400)
RDW: 13.4 % (ref 11.5–15.5)

## 2010-12-06 LAB — BASIC METABOLIC PANEL
BUN: 19 mg/dL (ref 6–23)
GFR calc Af Amer: 43 mL/min — ABNORMAL LOW (ref >60)
GFR calc non Af Amer: 35 mL/min — ABNORMAL LOW (ref >60)
Potassium: 4 mEq/L (ref 3.5–5.1)
Sodium: 137 mEq/L (ref 135–145)

## 2010-12-06 NOTE — Patient Instructions (Addendum)
20 [name]  12/06/2010   Your procedure is scheduled on:  12/13/2010  Report to [facility] at  830  AM.  Call this number if you have problems the morning of surgery: [phone]   Remember:   Do not eat food:After Midnight.  Do not drink clear liquids: After Midnight.  Take these medicines the morning of surgery with A SIP OF WATER: coreg,lisinopril,norvasc,wellbutrin,hytrin   Do not wear jewelry, make-up or nail polish.  Do not wear lotions, powders, or perfumes. You may wear deodorant.  Do not shave 48 hours prior to surgery.  Do not bring valuables to the hospital.  Contacts, dentures or bridgework may not be worn into surgery.  Leave suitcase in the car. After surgery it may be brought to your room.  For patients admitted to the hospital, checkout time is 11:00 AM the day of discharge.   Patients discharged the day of surgery will not be allowed to drive home.  Name and phone number of your driver: family  Special Instructions: N/A   Please read over the following fact sheets that you were given: Pain Booklet, Surgical Site Infection Prevention, Anesthesia Post-op Instructions and Care and Recovery After Surgery PATIENT INSTRUCTIONS POST-ANESTHESIA  IMMEDIATELY FOLLOWING SURGERY:  Do not drive or operate machinery for the first twenty four hours after surgery.  Do not make any important decisions for twenty four hours after surgery or while taking narcotic pain medications or sedatives.  If you develop intractable nausea and vomiting or a severe headache please notify your doctor immediately.  FOLLOW-UP:  Please make an appointment with your surgeon as instructed. You do not need to follow up with anesthesia unless specifically instructed to do so.  WOUND CARE INSTRUCTIONS (if applicable):  Keep a dry clean dressing on the anesthesia/puncture wound site if there is drainage.  Once the wound has quit draining you may leave it open to air.  Generally you should leave the  bandage intact for twenty four hours unless there is drainage.  If the epidural site drains for more than 36-48 hours please call the anesthesia department.  QUESTIONS?:  Please feel free to call your physician or the hospital operator if you have any questions, and they will be happy to assist you.     [facility] Anesthesia Department [address] [place] [phone]

## 2010-12-13 ENCOUNTER — Encounter (HOSPITAL_COMMUNITY): Payer: Self-pay | Admitting: Anesthesiology

## 2010-12-13 ENCOUNTER — Other Ambulatory Visit: Payer: Self-pay | Admitting: Gastroenterology

## 2010-12-13 ENCOUNTER — Encounter (HOSPITAL_COMMUNITY)
Admission: RE | Disposition: A | Discharge status: Home / Self Care | Payer: Self-pay | Source: Ambulatory Visit | Attending: Gastroenterology

## 2010-12-13 ENCOUNTER — Ambulatory Visit (HOSPITAL_COMMUNITY)
Admission: RE | Admit: 2010-12-13 | Discharge: 2010-12-13 | Disposition: A | Discharge status: Home / Self Care | Payer: PRIVATE HEALTH INSURANCE | Source: Ambulatory Visit | Attending: Gastroenterology | Admitting: Gastroenterology

## 2010-12-13 ENCOUNTER — Ambulatory Visit (HOSPITAL_COMMUNITY): Payer: PRIVATE HEALTH INSURANCE | Admitting: Anesthesiology

## 2010-12-13 DIAGNOSIS — K648 Other hemorrhoids: Secondary | ICD-10-CM | POA: Insufficient documentation

## 2010-12-13 DIAGNOSIS — Z01812 Encounter for preprocedural laboratory examination: Secondary | ICD-10-CM | POA: Insufficient documentation

## 2010-12-13 DIAGNOSIS — I10 Essential (primary) hypertension: Secondary | ICD-10-CM | POA: Insufficient documentation

## 2010-12-13 DIAGNOSIS — D509 Iron deficiency anemia, unspecified: Secondary | ICD-10-CM | POA: Insufficient documentation

## 2010-12-13 DIAGNOSIS — J4489 Other specified chronic obstructive pulmonary disease: Secondary | ICD-10-CM | POA: Insufficient documentation

## 2010-12-13 DIAGNOSIS — K5730 Diverticulosis of large intestine without perforation or abscess without bleeding: Secondary | ICD-10-CM | POA: Insufficient documentation

## 2010-12-13 DIAGNOSIS — R634 Abnormal weight loss: Secondary | ICD-10-CM | POA: Insufficient documentation

## 2010-12-13 DIAGNOSIS — J449 Chronic obstructive pulmonary disease, unspecified: Secondary | ICD-10-CM | POA: Insufficient documentation

## 2010-12-13 DIAGNOSIS — Z79899 Other long term (current) drug therapy: Secondary | ICD-10-CM | POA: Insufficient documentation

## 2010-12-13 DIAGNOSIS — E785 Hyperlipidemia, unspecified: Secondary | ICD-10-CM | POA: Insufficient documentation

## 2010-12-13 DIAGNOSIS — D126 Benign neoplasm of colon, unspecified: Secondary | ICD-10-CM | POA: Insufficient documentation

## 2010-12-13 HISTORY — PX: POLYPECTOMY: SHX5525

## 2010-12-13 SURGERY — COLONOSCOPY WITH PROPOFOL
Anesthesia: Monitor Anesthesia Care

## 2010-12-13 MED ORDER — PROPOFOL 10 MG/ML IV EMUL
INTRAVENOUS | Status: DC | PRN
  Administered 2010-12-13: 50 ug/kg/min via INTRAVENOUS

## 2010-12-13 MED ORDER — MIDAZOLAM HCL 2 MG/2ML IJ SOLN
1.0000 mg | INTRAMUSCULAR | Status: DC | PRN
  Administered 2010-12-13: 2 mg via INTRAVENOUS

## 2010-12-13 MED ORDER — BUTAMBEN-TETRACAINE-BENZOCAINE 2-2-14 % EX AERO
1.0000 | INHALATION_SPRAY | Freq: Once | CUTANEOUS | Status: DC
  Filled 2010-12-13: qty 56

## 2010-12-13 MED ORDER — LACTATED RINGERS IV SOLN
INTRAVENOUS | Status: DC
Start: 2010-12-13 — End: 2010-12-13
  Administered 2010-12-13: 150 mL/h via INTRAVENOUS

## 2010-12-13 MED ORDER — ONDANSETRON HCL 4 MG/2ML IJ SOLN
INTRAMUSCULAR | Status: AC
  Administered 2010-12-13: 4 mg via INTRAVENOUS
  Filled 2010-12-13: qty 2

## 2010-12-13 MED ORDER — MIDAZOLAM HCL 2 MG/2ML IJ SOLN
INTRAMUSCULAR | Status: AC
  Administered 2010-12-13: 2 mg via INTRAVENOUS
  Filled 2010-12-13: qty 2

## 2010-12-13 MED ORDER — PROPOFOL 10 MG/ML IV EMUL
INTRAVENOUS | Status: AC
  Filled 2010-12-13: qty 20

## 2010-12-13 MED ORDER — ONDANSETRON HCL 4 MG/2ML IJ SOLN
4.0000 mg | Freq: Once | INTRAMUSCULAR | Status: AC
  Administered 2010-12-13: 4 mg via INTRAVENOUS

## 2010-12-13 MED ORDER — FENTANYL CITRATE 0.05 MG/ML IJ SOLN
INTRAMUSCULAR | Status: AC
  Filled 2010-12-13: qty 2

## 2010-12-13 MED ORDER — GLYCOPYRROLATE 0.2 MG/ML IJ SOLN
INTRAMUSCULAR | Status: AC
  Administered 2010-12-13: 0.2 mg via INTRAVENOUS
  Filled 2010-12-13: qty 1

## 2010-12-13 MED ORDER — STERILE WATER FOR IRRIGATION IR SOLN
Status: DC | PRN
  Administered 2010-12-13: 08:00:00

## 2010-12-13 MED ORDER — POLYETHYLENE GLYCOL 3350 17 GM/SCOOP PO POWD: 17.0000 g | Freq: Every day | ORAL | Status: DC | PRN

## 2010-12-13 MED ORDER — FENTANYL CITRATE 0.05 MG/ML IJ SOLN
INTRAMUSCULAR | Status: DC | PRN
  Administered 2010-12-13 (×2): 50 ug via INTRAVENOUS

## 2010-12-13 MED ORDER — GLYCOPYRROLATE 0.2 MG/ML IJ SOLN
0.2000 mg | Freq: Once | INTRAMUSCULAR | Status: AC
  Administered 2010-12-13: 0.2 mg via INTRAVENOUS

## 2010-12-13 MED ORDER — WATER FOR IRRIGATION, STERILE IR SOLN
Status: DC | PRN
  Administered 2010-12-13: 1000 mL

## 2010-12-13 SURGICAL SUPPLY — 26 items
BLOCK BITE 60FR ADLT L/F BLUE (MISCELLANEOUS) ×2 IMPLANT
ELECT REM PT RETURN 9FT ADLT (ELECTROSURGICAL)
ELECTRODE REM PT RTRN 9FT ADLT (ELECTROSURGICAL) IMPLANT
FCP BXJMBJMB 240X2.8X (CUTTING FORCEPS)
FLOOR PAD 36X40 (MISCELLANEOUS) ×2
FORCEP RJ3 GP 1.8X160 W-NEEDLE (CUTTING FORCEPS) IMPLANT
FORCEPS BIOP RAD 4 LRG CAP 4 (CUTTING FORCEPS) ×1 IMPLANT
FORCEPS BIOP RJ4 240 W/NDL (CUTTING FORCEPS)
FORCEPS BXJMBJMB 240X2.8X (CUTTING FORCEPS) IMPLANT
INJECTOR/SNARE I SNARE (MISCELLANEOUS) IMPLANT
LUBRICANT JELLY 4.5OZ STERILE (MISCELLANEOUS) ×1 IMPLANT
MANIFOLD NEPTUNE II (INSTRUMENTS) ×1 IMPLANT
NDL SCLEROTHERAPY 25GX240 (NEEDLE) IMPLANT
NEEDLE SCLEROTHERAPY 25GX240 (NEEDLE) IMPLANT
PAD FLOOR 36X40 (MISCELLANEOUS) ×1 IMPLANT
PROBE APC STR FIRE (PROBE) IMPLANT
PROBE INJECTION GOLD (MISCELLANEOUS)
PROBE INJECTION GOLD 7FR (MISCELLANEOUS) IMPLANT
SNARE ROTATE MED OVAL 20MM (MISCELLANEOUS) IMPLANT
SNARE SHORT THROW 13M SML OVAL (MISCELLANEOUS) IMPLANT
SYR 50ML LL SCALE MARK (SYRINGE) ×1 IMPLANT
TRAP SPECIMEN MUCOUS 40CC (MISCELLANEOUS) IMPLANT
TUBING ENDO SMARTCAP (MISCELLANEOUS) ×2 IMPLANT
TUBING ENDO SMARTCAP PENTAX (MISCELLANEOUS) ×4 IMPLANT
TUBING IRRIGATION ENDOGATOR (MISCELLANEOUS) ×2 IMPLANT
WATER STERILE IRR 1000ML POUR (IV SOLUTION) ×2 IMPLANT

## 2010-12-13 NOTE — H&P (Signed)
Vitals - Last Recorded       BP Pulse Temp(Src) Ht Wt BMI    99/61  77  97 F (36.1 C) (Temporal)  6\' 3"  (1.905 m)  164 lb 12.8 oz (74.753 kg)  20.60 kg/m2          Progress Notes     Greg Halls, NP  10/19/2010  1:27 PM  Signed   Referring Provider: [name], MD Primary Care Physician:  Harlow Asa, MD, MD Primary Gastroenterologist:  Dr. Darrick Penna     Chief Complaint   Patient presents with   .  Rectal Pain      HPI:    [name] is a pleasant 65 year old African-American male who presents today at the request of Dr. Gerda Diss for an updated colonoscopy. Last performed in 2006 with Dr. Katrinka Blazing: findings of anal fissure and an adenomatous polyp. He noted a change in his bowel habits a few weeks ago. States becoming more constipated, discomfort in rectum with straining, occasional itching, no burning. Given anusol cream, which has improved symptoms significantly. BM daily. No loose stools. Denies fecal incontinence. No abdominal pain. Denies N/V. Has decreased appetite, stating his "taste buds aren't working right". He reports weighing around 240 2 years ago. He is now 164. (-76 lbs). Pt denies seeing brbpr; however, he states his wife has supposedly seen small amount of hematochezia.      Recent inpatient labs July 2012 with Hgb 13.6, Hct 37.6.    Past Medical History   Diagnosis  Date   .  Hypertension     .  COPD (chronic obstructive pulmonary disease)     .  Hyperlipidemia     .  Cardiomyopathy         EF of 30% per echo in June of 2012; admitted with congestive heart failure   .  History of noncompliance with medical treatment     .  Cerebrovascular disease  2011       h/o CVA/TIA   .  Chronic kidney disease         creatinine-1.7 in 08/2010   .  Alcohol abuse     .  Tobacco abuse         40 pack years   .  S/P cardiac cath  2012   .  S/P colonoscopy  2006       Dr. Katrinka Blazing: anal fissure, sessile polyp: adenomatous        Past Surgical History   Procedure  Date     .  Abdominal aortic aneurysm repair w/ endoluminal graft     .  Cardiac catheterization  june 2012       non-obstructive       Current Outpatient Prescriptions   Medication  Sig  Dispense  Refill   .  amLODipine (NORVASC) 5 MG tablet  Take 5 mg by mouth daily.           Marland Kitchen  aspirin 81 MG tablet  Take 81 mg by mouth daily.           Marland Kitchen  buPROPion (WELLBUTRIN XL) 150 MG 24 hr tablet  TAKE 1 TABLET BY MOUTH ONCE A DAY X 3 DAYS THEN INCREASE TO TWO TIMES A DAY   60 tablet   3   .  carvedilol (COREG) 6.25 MG tablet  Take 1 tablet (6.25 mg total) by mouth 2 (two) times daily with a meal.   60 tablet   3   .  colesevelam (WELCHOL) 625 MG tablet  Take 1,875 mg by mouth 2 (two) times daily with a meal.           .  CRESTOR 40 MG tablet  TAKE ONE TABLET BY MOUTH DAILY.   30 tablet   3   .  dipyridamole-aspirin (AGGRENOX) 25-200 MG per 12 hr capsule  Take 1 capsule by mouth 2 (two) times daily.           .  folic acid (FOLVITE) 1 MG tablet  Take 1 mg by mouth daily.           .  furosemide (LASIX) 40 MG tablet  Take 40 mg by mouth.           Marland Kitchen  lisinopril (PRINIVIL,ZESTRIL) 20 MG tablet  Take 20 mg by mouth daily.           Marland Kitchen  NITROSTAT 0.4 MG SL tablet           .  Omega-3 Fatty Acids 300 MG CAPS  Take 1 capsule by mouth daily.           Marland Kitchen  terazosin (HYTRIN) 10 MG capsule  Take 10 mg by mouth at bedtime.               Allergies as of 10/18/2010   .  (No Known Allergies)       Family History   Problem  Relation  Age of Onset   .  Colon cancer  Neg Hx         History       Social History   .  Marital Status:  Married       Spouse Name:  N/A       Number of Children:  N/A   .  Years of Education:  N/A       Occupational History   .  Not on file.       Social History Main Topics   .  Smoking status:  Current Everyday Smoker -- 1.0 packs/day for 50 years       Types:  Cigarettes   .  Smokeless tobacco:  Never Used   .  Alcohol Use:  16.8 oz/week       28 Shots of liquor per week          quit a few weeks ago, prior to this would drink about 7 oz of liquor per day   .  Drug Use:  No   .  Sexually Active:  Not on file        Review of Systems: Gen: Denies any fever, chills, + loss of appetite, fatigue, + weight loss. CV: Denies chest pain, heart palpitations, syncope, peripheral edema. Resp: Denies shortness of breath with rest, cough, wheezing GI: Denies dysphagia or odynophagia. Denies hematemesis, fecal incontinence, or jaundice.   GU : Denies urinary burning, urinary frequency, urinary incontinence.   MS: Denies joint pain, muscle weakness, cramps, limited movement Derm: Denies rash, itching, dry skin Psych: Denies depression, anxiety, confusion or memory loss   Heme: Denies bruising, bleeding, and enlarged lymph nodes.   Physical Exam: BP 99/61  Pulse 77  Temp(Src) 97 F (36.1 C) (Temporal)  Ht 6\' 3"  (1.905 m)  Wt 164 lb 12.8 oz (74.753 kg)  BMI 20.60 kg/m2 General:   Alert and oriented. Thin-appearing. Pleasant and cooperative.   Head:  Normocephalic and atraumatic. Eyes:  Conjunctiva pink, sclera clear, no icterus.  Ears:  Normal auditory acuity. Nose:  No deformity, discharge,  or lesions. Mouth:  No deformity or lesions Neck:  Supple, without mass or thyromegaly. Lungs:  Clear to auscultation bilaterally, without wheezing, rales, or rhonchi.   Heart:  S1, S2 present without murmurs noted.   Abdomen:  +BS, soft, thin, non-tender and non-distended. Without mass or HSM. No rebound or guarding. No hernias noted. Rectal:  No discomfort noted on exam. Poor sphincter tone, no masses appreciated, small amount of soft stool in rectal vault.   Msk:  Symmetrical without gross deformities. Normal posture. Extremities:  Without clubbing or edema. Neurologic:  Alert and  oriented x4;  grossly normal neurologically. Skin:  Intact, warm and dry without significant lesions or rashes Cervical Nodes:  No significant cervical adenopathy. Psych:  Alert and  cooperative. Normal mood and affect.  [name]  10/19/2010  3:42 PM  Signed Cc to PCP  [name], MD  10/28/2010 11:35 AM  Signed Pt need TCS/?EGD for anemia. Hold Aggrenox and ASA 5 days prior to procedure. Pt needs EcASA 325 mg daily instead. Aggrenox changed to ASA due to risk if bleeding.        Adenomatous polyp - Greg Halls, NP  10/19/2010  1:27 PM  Signed 65 year old African-American male with hx of adenomatous polyp on colonoscopy by Dr. Katrinka Blazing in 2006, somewhat overdue for surveillance. Reportedly weighed in the 240 range 2 years ago, now 76 lb wt loss per his report. I do not have documentation to support this. Pt does appear thin, but not cachectic. He admits to poor appetite. Denies N/V or abdominal pain. Recent change in bowel habits, trending towards feeling more constipated. Noted anal itching/discomfort with straining. Improved with anusol cream from PCP. Rectal exam without mass noted. Wife reports possible low-volume hematochezia, pt unsure. Heme status unknown. Needs updated surveillance colonoscopy. Wt loss concerning, occult malignancy unable to be excluded. Without UGI symptoms except loss of appetite. Will obtain ifobt, possibly proceed with EGD at time of colonoscopy if necessary.    1. ifobt 2. Proceed with colonoscopy with Dr. Darrick Penna in the near future. Possible upper endoscopy. The risks, benefits, and alternatives have been discussed in detail with the patient. They state understanding and desire to proceed.

## 2010-12-13 NOTE — Interval H&P Note (Signed)
History and Physical Interval Note:   12/13/2010   8:30 AM   [name]  has presented today for surgery, with the diagnosis of SURV, & WT LOSS  The various methods of treatment have been discussed with the patient and family. After consideration of risks, benefits and other options for treatment, the patient has consented to  Procedure(s): COLONOSCOPY WITH PROPOFOL ESOPHAGOGASTRODUODENOSCOPY (EGD) WITH PROPOFOL as a surgical intervention .  I have reviewed the patients' chart and labs.  Questions were answered to the patient's satisfaction.     Greg Eva  MD       Primary Care Physician:  Greg Asa, MD, MD Primary Gastroenterologist:  Dr. Darrick [name]  Pre-Procedure History & Physical: HPI:  [name] is a 65 y.o. male here for ANEMIA AND WEIGHT LOSS.  Past Medical History  Diagnosis Date  . Hypertension   . COPD (chronic obstructive pulmonary disease)   . Hyperlipidemia   . Cardiomyopathy     EF of 30% per echo in June of 2012; admitted with congestive heart failure; nonobstructive CAD on cath in 08/2010  . History of noncompliance with medical treatment   . Cerebrovascular disease 2011    CVA  in 02/2010-only deficit is decreased vision in  right eye  . Chronic kidney disease     creatinine-1.7 in 08/2010  . Alcohol abuse   . Tobacco abuse     40 pack years  . Abdominal aortic aneurysm     Stent graft repair  . Stroke 2011    decreased vision of right eye    Past Surgical History  Procedure Date  . Abdominal aortic aneurysm repair w/ endoluminal graft     2 yrs ago-[place]  . Colonoscopy w/ polypectomy 2006    Dr. Katrinka Blazing: anal fissure, sessile adenomatous polyp, diverticulosis  . Esophagogastroduodenoscopy 11/15/2010    Procedure: ESOPHAGOGASTRODUODENOSCOPY (EGD);  Surgeon: Arlyce Harman, MD;  Location: [facility];  Service: Endoscopy;  Laterality: N/A;  with propofol sedation; procedure start @ 0813  . Flexible sigmoidoscopy 11/15/2010   Procedure: FLEXIBLE SIGMOIDOSCOPY;  Surgeon: Arlyce Harman, MD;  Location: [facility];  Service: Endoscopy;  Laterality: N/A;  with propofol sedation; ended @ 0808    Prior to Admission medications   Medication Sig Start Date End Date Taking? Authorizing Provider  amLODipine (NORVASC) 5 MG tablet Take 5 mg by mouth daily.     Yes Historical Provider, MD  buPROPion (WELLBUTRIN XL) 150 MG 24 hr tablet Take 150 mg by mouth 2 (two) times daily.     Yes Historical Provider, MD  carvedilol (COREG) 6.25 MG tablet Take 1 tablet (6.25 mg total) by mouth 2 (two) times daily with a meal. 10/07/10  Yes Joni Reining, NP  colesevelam [facility]) 625 MG tablet Take 1,250 mg by mouth 2 (two) times daily.   11/18/10  Yes Gerrit Friends. Rothbart, MD  CRESTOR 40 MG tablet TAKE ONE TABLET BY MOUTH DAILY. 09/15/10  Yes Gerrit Friends. Rothbart, MD  dipyridamole-aspirin (AGGRENOX) 25-200 MG per 12 hr capsule Take 1 capsule by mouth 2 (two) times daily.     Yes Historical Provider, MD  fluticasone (CUTIVATE) 0.05 % cream Apply 1 application topically 2 (two) times daily as needed. For psoriasis   Yes Historical Provider, MD  folic acid (FOLVITE) 1 MG tablet Take 1 mg by mouth daily.    Yes Historical Provider, MD  furosemide (LASIX) 40 MG tablet Take 40 mg by mouth daily.    Yes Historical  Provider, MD  lisinopril (PRINIVIL,ZESTRIL) 20 MG tablet Take 20 mg by mouth daily.     Yes Historical Provider, MD  polyethylene glycol powder (GLYCOLAX/MIRALAX) powder Take 17 g by mouth daily as needed. Constipation 10/04/10  Yes Historical Provider, MD  PROCTOFOAM [facility] rectal foam Place 1 applicator rectally 2 (two) times daily as needed.  10/08/10  Yes Historical Provider, MD  terazosin (HYTRIN) 10 MG capsule Take 10 mg by mouth at bedtime.     Yes Historical Provider, MD  buPROPion (WELLBUTRIN XL) 150 MG 24 hr tablet   10/02/10   Gerrit Friends. Rothbart, MD  ezetimibe (ZETIA) 10 MG tablet Take 1 tablet (10 mg total) by mouth daily. 11/18/10 11/18/11   Gerrit Friends. Dietrich Pates, MD  Boris Lown Oil 300 MG CAPS Take 1 capsule by mouth daily.      Historical Provider, MD  lisinopril (PRINIVIL,ZESTRIL) 10 MG tablet Take 1 tablet (10 mg total) by mouth daily. To replace Amlodipine 11/19/10   Gerrit Friends. Rothbart, MD  NITROSTAT 0.4 MG SL tablet Place 0.4 mg under the tongue every 5 (five) minutes as needed. For chest pain 09/24/10   Historical Provider, MD  omeprazole (PRILOSEC) 20 MG capsule Take 20 mg by mouth daily.   11/15/10 11/15/11  Arlyce Harman, MD    Allergies as of 11/15/2010  . (No Known Allergies)    Family History  Problem Relation Age of Onset  . Colon cancer Neg Hx   . Anesthesia problems Neg Hx   . Hypotension Neg Hx   . Malignant hyperthermia Neg Hx   . Pseudochol deficiency Neg Hx     History   Social History  . Marital Status: Married    Spouse Name: N/A    Number of Children: N/A  . Years of Education: N/A   Occupational History  . Not on file.   Social History Main Topics  . Smoking status: Current Everyday Smoker -- 0.5 packs/day for 50 years    Types: Cigarettes  . Smokeless tobacco: Never Used  . Alcohol Use: 16.8 oz/week    28 Shots of liquor per week     quit about 1 month ago, prior to this would drink about 7 oz of liquor per day  . Drug Use: No  . Sexually Active: No   Other Topics Concern  . Not on file   Social History Narrative  . No narrative on file    Review of Systems: See HPI, otherwise negative ROS   Physical Exam: BP 170/92  Pulse 66  Temp(Src) 98 F (36.7 C) (Oral)  Resp 18  SpO2 99% General:   Alert,  pleasant and cooperative in NAD Head:  Normocephalic and atraumatic. Neck:  Supple; no masses or thyromegaly. Lungs:  Clear throughout to auscultation.    Heart:  Regular rate and rhythm. Abdomen:  Soft, nontender and nondistended. Normal bowel sounds, without guarding, and without rebound.   Neurologic:  Alert and  oriented x4;  grossly normal neurologically.  Impression/Plan:   65  YO MALE PRESENTED FOR EGD/TCS AUG 2012. Incomplete TCS due to poor bowel prep.  PLAN: COMPLETE TCS TODAY.

## 2010-12-13 NOTE — Anesthesia Preprocedure Evaluation (Addendum)
Anesthesia Evaluation  Name, MR# and DOB Patient awake  General Assessment Comment  Reviewed: Allergy & Precautions, H&P , NPO status , Patient's Chart, lab work & pertinent test results, reviewed documented beta blocker date and time   History of Anesthesia Complications Negative for: history of anesthetic complications  Airway Mallampati: II  Neck ROM: Full    Dental  (+) Edentulous Upper and Edentulous Lower   Pulmonary  COPD      Cardiovascular hypertension, Pt. on medications + Peripheral Vascular Disease and +CHF Regular Normal    Neuro/Psych    (+) PSYCHIATRIC DISORDERS,  CVA, No Residual Symptoms   GI/Hepatic/Renal   Endo/Other    Abdominal   Musculoskeletal   Hematology   Peds  Reproductive/Obstetrics    Anesthesia Other Findings             Anesthesia Physical Anesthesia Plan  ASA: III  Anesthesia Plan: MAC   Post-op Pain Management:    Induction: Intravenous  Airway Management Planned: Simple Face Mask  Additional Equipment:   Intra-op Plan:   Post-operative Plan:   Informed Consent: I have reviewed the patients History and Physical, chart, labs and discussed the procedure including the risks, benefits and alternatives for the proposed anesthesia with the patient or authorized representative who has indicated his/her understanding and acceptance.     Plan Discussed with:   Anesthesia Plan Comments:         Anesthesia Quick Evaluation

## 2010-12-13 NOTE — Anesthesia Postprocedure Evaluation (Signed)
  Anesthesia Post-op Note  Patient: [name]  Procedure(s) Performed:  COLONOSCOPY WITH PROPOFOL; POLYPECTOMY - right colon, cecal, transverse, and sigmoid polypectomy  Patient Location: PACU  Anesthesia Type: MAC  Level of Consciousness: awake, alert  and oriented  Airway and Oxygen Therapy: Patient Spontanous Breathing and Patient connected to face mask oxygen  Post-op Pain: mild  Post-op Assessment: Post-op Vital signs reviewed, Patient's Cardiovascular Status Stable and Respiratory Function Stable  Post-op Vital Signs: Reviewed  Complications: No apparent anesthesia complications

## 2010-12-13 NOTE — Transfer of Care (Signed)
Immediate Anesthesia Transfer of Care Note  Patient: [name]  Procedure(s) Performed:  COLONOSCOPY WITH PROPOFOL; POLYPECTOMY - right colon, cecal, transverse, and sigmoid polypectomy  Patient Location: PACU  Anesthesia Type: MAC  Level of Consciousness: awake and alert   Airway & Oxygen Therapy: Patient Spontanous Breathing and Patient connected to face mask oxygen  Post-op Assessment: Report given to PACU RN and Post -op Vital signs reviewed and stable  Post vital signs: Reviewed  Complications: No apparent anesthesia complications

## 2010-12-14 ENCOUNTER — Telehealth: Payer: Self-pay | Admitting: Gastroenterology

## 2010-12-14 DIAGNOSIS — D126 Benign neoplasm of colon, unspecified: Secondary | ICD-10-CM

## 2010-12-14 DIAGNOSIS — K5730 Diverticulosis of large intestine without perforation or abscess without bleeding: Secondary | ICD-10-CM

## 2010-12-14 DIAGNOSIS — K648 Other hemorrhoids: Secondary | ICD-10-CM

## 2010-12-14 DIAGNOSIS — D649 Anemia, unspecified: Secondary | ICD-10-CM

## 2010-12-14 NOTE — Telephone Encounter (Signed)
Please call pt. He had simple adenomas removed from hIS colon. High fiber diet. Take Miralax twice a day. Screening colonoscopy in 10 years with 2 days of clear liquids and 2 days of bowel prep. Follow up in 4 mos TO REASSESS CONSTIPATION AND RECTAL BLEEDING.

## 2010-12-15 NOTE — Telephone Encounter (Signed)
Pt informed

## 2010-12-15 NOTE — Telephone Encounter (Signed)
Results Cc to PCP and 10 yr Repeat reminder is in the computer

## 2010-12-15 NOTE — Telephone Encounter (Signed)
Opened in error

## 2010-12-15 NOTE — Telephone Encounter (Signed)
Reminder in epic to follow up in 4 months °

## 2010-12-16 ENCOUNTER — Encounter (HOSPITAL_COMMUNITY): Payer: Self-pay | Admitting: Gastroenterology

## 2010-12-17 ENCOUNTER — Other Ambulatory Visit: Payer: Self-pay | Admitting: Cardiology

## 2010-12-18 LAB — LIPID PANEL
LDL Cholesterol: 89 mg/dL (ref 0–99)
Triglycerides: 52 mg/dL (ref <150)
VLDL: 10 mg/dL (ref 0–40)

## 2010-12-21 LAB — COMPREHENSIVE METABOLIC PANEL
ALT: 13
AST: 22
Albumin: 3 — ABNORMAL LOW
Alkaline Phosphatase: 58
BUN: 9
BUN: 9
CO2: 19
CO2: 23
Calcium: 8.6
Calcium: 9.9
Chloride: 106
Creatinine, Ser: 0.94
Creatinine, Ser: 1.24
GFR calc Af Amer: >60
GFR calc non Af Amer: 59 — ABNORMAL LOW
GFR calc non Af Amer: >60
Glucose, Bld: 150 — ABNORMAL HIGH
Glucose, Bld: 91
Potassium: 3.9
Sodium: 135
Total Bilirubin: 1.5 — ABNORMAL HIGH
Total Protein: 6

## 2010-12-21 LAB — CBC
HCT: 42
Hemoglobin: 18 — ABNORMAL HIGH
MCHC: 33.7
MCHC: 34.4
MCV: 103 — ABNORMAL HIGH
MCV: 104.7 — ABNORMAL HIGH
Platelets: 132 — ABNORMAL LOW
RBC: 4.08 — ABNORMAL LOW
RBC: 4.18 — ABNORMAL LOW
RBC: 5.1
WBC: 22.5 — ABNORMAL HIGH
WBC: 6.2

## 2010-12-21 LAB — URINALYSIS, ROUTINE W REFLEX MICROSCOPIC
Ketones, ur: 15 — AB
Leukocytes, UA: NEGATIVE
Nitrite: NEGATIVE
Protein, ur: 30 — AB

## 2010-12-21 LAB — BLOOD GAS, ARTERIAL
Acid-base deficit: 2.6 — ABNORMAL HIGH
Acid-base deficit: 2.6 — ABNORMAL HIGH
Bicarbonate: 21.2
Bicarbonate: 22.3
Drawn by: 206361
FIO2: 0.21
O2 Content: 2
O2 Saturation: 95.8
O2 Saturation: 96.7
Patient temperature: 98.6
TCO2: 23.6
pCO2 arterial: 42.3
pH, Arterial: 7.342 — ABNORMAL LOW
pO2, Arterial: 79.8 — ABNORMAL LOW
pO2, Arterial: 81.3

## 2010-12-21 LAB — URINE MICROSCOPIC-ADD ON

## 2010-12-21 LAB — POCT I-STAT 7, (LYTES, BLD GAS, ICA,H+H)
Calcium, Ion: 1.19
Hemoglobin: 14.6
O2 Saturation: 99
Patient temperature: 36
pCO2 arterial: 37.6
pH, Arterial: 7.367
pO2, Arterial: 151 — ABNORMAL HIGH

## 2010-12-21 LAB — BASIC METABOLIC PANEL
BUN: 8
CO2: 24
Chloride: 109
Creatinine, Ser: 1
Potassium: 3.8

## 2010-12-21 LAB — PROTIME-INR
INR: 1.1
Prothrombin Time: 13.5
Prothrombin Time: 14.9

## 2010-12-21 LAB — TYPE AND SCREEN

## 2010-12-21 LAB — AMYLASE: Amylase: 211 — ABNORMAL HIGH

## 2010-12-21 LAB — APTT
aPTT: 111 — ABNORMAL HIGH
aPTT: 29

## 2010-12-21 LAB — MAGNESIUM: Magnesium: 1.3 — ABNORMAL LOW

## 2011-01-07 ENCOUNTER — Other Ambulatory Visit: Payer: Self-pay | Admitting: Cardiology

## 2011-01-22 ENCOUNTER — Other Ambulatory Visit: Payer: Self-pay | Admitting: Cardiology

## 2011-02-22 ENCOUNTER — Other Ambulatory Visit: Payer: Self-pay | Admitting: Neurology

## 2011-02-22 DIAGNOSIS — R4182 Altered mental status, unspecified: Secondary | ICD-10-CM

## 2011-02-25 ENCOUNTER — Ambulatory Visit (HOSPITAL_COMMUNITY)
Admission: RE | Admit: 2011-02-25 | Discharge: 2011-02-25 | Disposition: A | Discharge status: Home / Self Care | Payer: PRIVATE HEALTH INSURANCE | Source: Ambulatory Visit | Attending: Neurology | Admitting: Neurology

## 2011-02-25 DIAGNOSIS — R42 Dizziness and giddiness: Secondary | ICD-10-CM | POA: Insufficient documentation

## 2011-02-25 DIAGNOSIS — R269 Unspecified abnormalities of gait and mobility: Secondary | ICD-10-CM | POA: Insufficient documentation

## 2011-02-25 DIAGNOSIS — R4182 Altered mental status, unspecified: Secondary | ICD-10-CM | POA: Insufficient documentation

## 2011-02-28 ENCOUNTER — Encounter: Payer: Self-pay | Admitting: Surgery

## 2011-03-23 ENCOUNTER — Encounter: Payer: Self-pay | Admitting: Gastroenterology

## 2011-04-01 ENCOUNTER — Encounter: Payer: Self-pay | Admitting: Surgery

## 2011-04-04 ENCOUNTER — Encounter: Payer: Self-pay | Admitting: Surgery

## 2011-04-04 ENCOUNTER — Ambulatory Visit (INDEPENDENT_AMBULATORY_CARE_PROVIDER_SITE_OTHER): Payer: Medicare Other | Admitting: Surgery

## 2011-04-04 ENCOUNTER — Encounter (INDEPENDENT_AMBULATORY_CARE_PROVIDER_SITE_OTHER): Payer: Medicare Other | Admitting: *Deleted

## 2011-04-04 VITALS — BP 205/122 | HR 68 | Resp 16 | Ht 75.0 in | Wt 175.0 lb

## 2011-04-04 DIAGNOSIS — I714 Abdominal aortic aneurysm, without rupture, unspecified: Secondary | ICD-10-CM | POA: Insufficient documentation

## 2011-04-04 DIAGNOSIS — Z48812 Encounter for surgical aftercare following surgery on the circulatory system: Secondary | ICD-10-CM

## 2011-04-04 NOTE — Progress Notes (Signed)
[facility] [place]   Patient name: [name] MRN: [id number] DOB: [birth year] Sex: male     Chief Complaint  Patient presents with  . AAA    one year f/up with Labs.    HISTORY OF PRESENT ILLNESS: The patient is here today for followup. She is status post endovascular repair of an abdominal aortic aneurysm on 01/16/2008 using a Cook Zenith device. He is not having complaints today. He denies abdominal pain. He does state that he has had several strokes in the past affecting the vision in his right eye. I did get an ultrasound prior to his aneurysm repair which showed no significant disease in either carotid artery.  Past Medical History  Diagnosis Date  . Hypertension   . COPD (chronic obstructive pulmonary disease)   . Hyperlipidemia   . Cardiomyopathy     EF of 30% per echo in June of 2012; admitted with congestive heart failure; nonobstructive CAD on cath in 08/2010  . History of noncompliance with medical treatment   . Cerebrovascular disease 2011    CVA  in 02/2010-only deficit is decreased vision in  right eye  . Chronic kidney disease     creatinine-1.7 in 08/2010  . Alcohol abuse   . Tobacco abuse     40 pack years  . Abdominal aortic aneurysm     Stent graft repair  . Stroke 2011    decreased vision of right eye  . CHF (congestive heart failure)   . Leg pain     Past Surgical History  Procedure Date  . Abdominal aortic aneurysm repair w/ endoluminal graft     2 yrs ago-[place]  . Colonoscopy w/ polypectomy 2006    Dr. Katrinka Blazing: anal fissure, sessile adenomatous polyp, diverticulosis  . Esophagogastroduodenoscopy 11/15/2010    Procedure: ESOPHAGOGASTRODUODENOSCOPY (EGD);  Surgeon: Arlyce Harman, MD;  Location: [facility];  Service: Endoscopy;  Laterality: N/A;  with propofol sedation; procedure start @ 0813  . Flexible sigmoidoscopy 11/15/2010    Procedure: FLEXIBLE SIGMOIDOSCOPY;  Surgeon: Arlyce Harman, MD;  Location: [facility];  Service:  Endoscopy;  Laterality: N/A;  with propofol sedation; ended @ 0808  . Polypectomy 12/13/2010    Procedure: POLYPECTOMY;  Surgeon: Arlyce Harman, MD;  Location: [facility];  Service: Endoscopy;  Laterality: N/A;  right colon, cecal, transverse, and sigmoid polypectomy    History   Social History  . Marital Status: Married    Spouse Name: N/A    Number of Children: N/A  . Years of Education: N/A   Occupational History  . Not on file.   Social History Main Topics  . Smoking status: Current Everyday Smoker -- 0.5 packs/day for 50 years    Types: Cigarettes  . Smokeless tobacco: Never Used  . Alcohol Use: 16.8 oz/week    28 Shots of liquor per week     quit about 1 month ago, prior to this would drink about 7 oz of liquor per day  . Drug Use: No  . Sexually Active: No   Other Topics Concern  . Not on file   Social History Narrative  . No narrative on file    Family History  Problem Relation Age of Onset  . Colon cancer Neg Hx   . Anesthesia problems Neg Hx   . Hypotension Neg Hx   . Malignant hyperthermia Neg Hx   . Pseudochol deficiency Neg Hx     Allergies as of 04/04/2011  . (  No Known Allergies)    Current Outpatient Prescriptions on File Prior to Visit  Medication Sig Dispense Refill  . amLODipine (NORVASC) 5 MG tablet Take 5 mg by mouth daily.        Marland Kitchen buPROPion (WELLBUTRIN XL) 150 MG 24 hr tablet Take 150 mg by mouth 2 (two) times daily.        Marland Kitchen buPROPion (WELLBUTRIN XL) 150 MG 24 hr tablet TAKE 1 TABLET BY MOUTH ONCE A DAY X 3 DAYS THEN INCREASE TO TWO TIMES A DAY  60 tablet  3  . carvedilol (COREG) 6.25 MG tablet Take 1 tablet (6.25 mg total) by mouth 2 (two) times daily with a meal.  60 tablet  3  . cloNIDine (CATAPRES) 0.2 MG tablet Take 0.2 mg by mouth 2 (two) times daily.        . colesevelam (WELCHOL) 625 MG tablet Take 1,250 mg by mouth 2 (two) times daily.        . CRESTOR 40 MG tablet TAKE ONE TABLET BY MOUTH DAILY.  30 tablet  3  . dipyridamole-aspirin  (AGGRENOX) 25-200 MG per 12 hr capsule Take 1 capsule by mouth 2 (two) times daily.        . fluticasone (CUTIVATE) 0.05 % cream Apply 1 application topically 2 (two) times daily as needed. For psoriasis      . folic acid (FOLVITE) 1 MG tablet Take 1 mg by mouth daily.       . furosemide (LASIX) 40 MG tablet Take 40 mg by mouth daily.       Marland Kitchen lisinopril (PRINIVIL,ZESTRIL) 20 MG tablet Take 20 mg by mouth daily.        . metoprolol (TOPROL-XL) 50 MG 24 hr tablet Take 50 mg by mouth daily.        . niacin (NIASPAN) 500 MG CR tablet Take 500 mg by mouth at bedtime.        Marland Kitchen NITROSTAT 0.4 MG SL tablet Place 0.4 mg under the tongue every 5 (five) minutes as needed. For chest pain      . omeprazole (PRILOSEC) 20 MG capsule Take 20 mg by mouth daily.        . polyethylene glycol powder (GLYCOLAX/MIRALAX) powder Take 17 g by mouth daily as needed. 1 po bid  255 g  11  . PROCTOFOAM HC rectal foam Place 1 applicator rectally 2 (two) times daily as needed.       . terazosin (HYTRIN) 10 MG capsule Take 10 mg by mouth at bedtime.           REVIEW OF SYSTEMS: Positive for chest pressure pain in his legs when walking and when lying flat weakness in his arms and legs slurred speech temporary loss of vision all other review of systems are negative as documented in the encounter form.  PHYSICAL EXAMINATION:   Vital signs are BP 205/122  Pulse 68  Resp 16  Ht 6\' 3"  (1.905 m)  Wt 175 lb (79.379 kg)  BMI 21.87 kg/m2  SpO2 100% General: The patient appears their stated age. HEENT:  No gross abnormalities Pulmonary:  Non labored breathing Abdomen: Soft and non-tender Musculoskeletal: There are no major deformities. Neurologic: No focal weakness or paresthesias are detected, Skin: There are no ulcer or rashes noted. Psychiatric: The patient has normal affect. Cardiovascular: There is a regular rate and rhythm without significant murmur appreciated. No carotid bruit   Diagnostic Studies Ultrasound was  reviewed today. The previous aneurysm measurement was 3.1  x 3.0. Today's measurements are 3.2 x 3.3  Assessment: Status post endovascular aneurysm repair Plan: While the patient does have a slight increase in the size of his aneurysm by ultrasound I suspect this is within the limitations of the study and this did not a true increase in size. I'll plan to have the patient back in one year for repeat ultrasound. If his aneurysm continues to grow I will consider getting a CT scan. At his next visit in one year I will also repeat his carotid ultrasound.  Jorge Ny, M.D. [facility] Office: [phone] Pager:  [phone]

## 2011-04-11 NOTE — Procedures (Unsigned)
VASCULAR LAB EXAM  INDICATION:  Follow up AAA  endograft placed 01/16/2008.  HISTORY: Diabetes:  No. Cardiac:  No. Hypertension:  Yes.  EXAM: 1. AAA sac size 3.24 cm AP, 3.34 cm transverse. 2. Previous sac size 03/29/2010:  3.1 cm AP, 3.0 cm transverse.  IMPRESSION: 1. The aorta and endograft appear patent. 2. No significant change in the size of the aneurysmal sac surrounding     the endograft. 3. No evidence of endoleak was detected.  ___________________________________________ V. Charlena Cross, MD  EM/MEDQ  D:  04/04/2011  T:  04/04/2011  Job:  [id number]

## 2011-04-26 ENCOUNTER — Encounter (HOSPITAL_COMMUNITY): Payer: Self-pay | Admitting: *Deleted

## 2011-04-26 ENCOUNTER — Emergency Department (HOSPITAL_COMMUNITY): Payer: Private Health Insurance - Indemnity

## 2011-04-26 ENCOUNTER — Emergency Department (HOSPITAL_COMMUNITY)
Admission: EM | Admit: 2011-04-26 | Discharge: 2011-04-26 | Disposition: A | Discharge status: Home / Self Care | Payer: Private Health Insurance - Indemnity | Attending: Emergency Medicine | Admitting: Emergency Medicine

## 2011-04-26 DIAGNOSIS — M25569 Pain in unspecified knee: Secondary | ICD-10-CM | POA: Insufficient documentation

## 2011-04-26 DIAGNOSIS — F17200 Nicotine dependence, unspecified, uncomplicated: Secondary | ICD-10-CM | POA: Insufficient documentation

## 2011-04-26 DIAGNOSIS — H539 Unspecified visual disturbance: Secondary | ICD-10-CM | POA: Insufficient documentation

## 2011-04-26 DIAGNOSIS — Z79899 Other long term (current) drug therapy: Secondary | ICD-10-CM | POA: Insufficient documentation

## 2011-04-26 DIAGNOSIS — N189 Chronic kidney disease, unspecified: Secondary | ICD-10-CM | POA: Insufficient documentation

## 2011-04-26 DIAGNOSIS — I509 Heart failure, unspecified: Secondary | ICD-10-CM | POA: Insufficient documentation

## 2011-04-26 DIAGNOSIS — E860 Dehydration: Secondary | ICD-10-CM | POA: Insufficient documentation

## 2011-04-26 DIAGNOSIS — R197 Diarrhea, unspecified: Secondary | ICD-10-CM | POA: Insufficient documentation

## 2011-04-26 DIAGNOSIS — M1990 Unspecified osteoarthritis, unspecified site: Secondary | ICD-10-CM

## 2011-04-26 DIAGNOSIS — E785 Hyperlipidemia, unspecified: Secondary | ICD-10-CM | POA: Insufficient documentation

## 2011-04-26 DIAGNOSIS — J4489 Other specified chronic obstructive pulmonary disease: Secondary | ICD-10-CM | POA: Insufficient documentation

## 2011-04-26 DIAGNOSIS — I129 Hypertensive chronic kidney disease with stage 1 through stage 4 chronic kidney disease, or unspecified chronic kidney disease: Secondary | ICD-10-CM | POA: Insufficient documentation

## 2011-04-26 DIAGNOSIS — J449 Chronic obstructive pulmonary disease, unspecified: Secondary | ICD-10-CM | POA: Insufficient documentation

## 2011-04-26 DIAGNOSIS — I428 Other cardiomyopathies: Secondary | ICD-10-CM | POA: Insufficient documentation

## 2011-04-26 DIAGNOSIS — Z7982 Long term (current) use of aspirin: Secondary | ICD-10-CM | POA: Insufficient documentation

## 2011-04-26 DIAGNOSIS — I69998 Other sequelae following unspecified cerebrovascular disease: Secondary | ICD-10-CM | POA: Insufficient documentation

## 2011-04-26 LAB — CBC
MCH: 33.2 pg (ref 26.0–34.0)
MCHC: 34.7 g/dL (ref 30.0–36.0)
MCV: 95.7 fL (ref 78.0–100.0)
Platelets: 250 10*3/uL (ref 150–400)
RBC: 4.43 MIL/uL (ref 4.22–5.81)

## 2011-04-26 LAB — COMPREHENSIVE METABOLIC PANEL
BUN: 19 mg/dL (ref 6–23)
Calcium: 10.2 mg/dL (ref 8.4–10.5)
GFR calc Af Amer: 42 mL/min — ABNORMAL LOW (ref >90)
Glucose, Bld: 110 mg/dL — ABNORMAL HIGH (ref 70–99)
Sodium: 138 mEq/L (ref 135–145)
Total Protein: 8 g/dL (ref 6.0–8.3)

## 2011-04-26 LAB — URINE MICROSCOPIC-ADD ON

## 2011-04-26 LAB — URINALYSIS, ROUTINE W REFLEX MICROSCOPIC
Leukocytes, UA: NEGATIVE
Nitrite: NEGATIVE
Specific Gravity, Urine: >1.03 — ABNORMAL HIGH (ref 1.005–1.030)
pH: 5.5 (ref 5.0–8.0)

## 2011-04-26 LAB — DIFFERENTIAL
Eosinophils Absolute: 0.3 10*3/uL (ref 0.0–0.7)
Eosinophils Relative: 5 % (ref 0–5)
Lymphs Abs: 2.2 10*3/uL (ref 0.7–4.0)

## 2011-04-26 MED ORDER — SODIUM CHLORIDE 0.9 % IV BOLUS (SEPSIS)
1000.0000 mL | Freq: Once | INTRAVENOUS | Status: AC
  Administered 2011-04-26: 1000 mL via INTRAVENOUS

## 2011-04-26 NOTE — ED Notes (Signed)
Urinal given to patient to collect urine specimen. Pt states he doesn't feel like he needs to go right now but will call when he has a specimen.

## 2011-04-26 NOTE — ED Provider Notes (Signed)
History    This chart was scribed for Greg Lennert, MD, MD by [name]. The patient was seen in room APA10 and the patient's care was started at 9:26PM.   CSN: [id number]  Arrival date & time 04/26/11  1955   First MD Initiated Contact with Patient 04/26/11 2120      Chief Complaint  Patient presents with  . Knee Pain    (Consider location/radiation/quality/duration/timing/severity/associated sxs/prior treatment) Patient is a 66 y.o. male presenting with knee pain. The history is provided by the patient.  Knee Pain This is a new problem. The current episode started 6 to 12 hours ago. The problem occurs constantly. The problem has not changed since onset.Pertinent negatives include no chest pain and no headaches. The symptoms are aggravated by exertion. He has tried nothing for the symptoms.   [name] is a 66 y.o. male who presents to the Emergency Department complaining of moderate bilateral knee pain onset today. Pt was brought in by relative due to excessive diarrhea and dark brown urine onset today. Pt's relative reports pt has been having trouble moving around house lately. The pain has been constant since onset without radiation. PCP is Dr. Lilyan [name]  Past Medical History  Diagnosis Date  . Hypertension   . COPD (chronic obstructive pulmonary disease)   . Hyperlipidemia   . Cardiomyopathy     EF of 30% per echo in June of 2012; admitted with congestive heart failure; nonobstructive CAD on cath in 08/2010  . History of noncompliance with medical treatment   . Cerebrovascular disease 2011    CVA  in 02/2010-only deficit is decreased vision in  right eye  . Chronic kidney disease     creatinine-1.7 in 08/2010  . Alcohol abuse   . Tobacco abuse     40 pack years  . Abdominal aortic aneurysm     Stent graft repair  . Stroke 2011    decreased vision of right eye  . CHF (congestive heart failure)   . Leg pain   . Renal disorder     Past Surgical History    Procedure Date  . Abdominal aortic aneurysm repair w/ endoluminal graft     2 yrs ago-[place]  . Colonoscopy w/ polypectomy 2006    Dr. Katrinka [name]: anal fissure, sessile adenomatous polyp, diverticulosis  . Esophagogastroduodenoscopy 11/15/2010    Procedure: ESOPHAGOGASTRODUODENOSCOPY (EGD);  Surgeon: Greg Harman, MD;  Location: [facility];  Service: Endoscopy;  Laterality: N/A;  with propofol sedation; procedure start @ 0813  . Flexible sigmoidoscopy 11/15/2010    Procedure: FLEXIBLE SIGMOIDOSCOPY;  Surgeon: Greg Harman, MD;  Location: [facility];  Service: Endoscopy;  Laterality: N/A;  with propofol sedation; ended @ 0808  . Polypectomy 12/13/2010    Procedure: POLYPECTOMY;  Surgeon: Greg Harman, MD;  Location: [facility];  Service: Endoscopy;  Laterality: N/A;  right colon, cecal, transverse, and sigmoid polypectomy    Family History  Problem Relation Age of Onset  . Colon cancer Neg Hx   . Anesthesia problems Neg Hx   . Hypotension Neg Hx   . Malignant hyperthermia Neg Hx   . Pseudochol deficiency Neg Hx     History  Substance Use Topics  . Smoking status: Current Everyday Smoker -- 0.5 packs/day for 50 years    Types: Cigarettes  . Smokeless tobacco: Never Used  . Alcohol Use: 16.8 oz/week    28 Shots of liquor per week     quit about  1 month ago, prior to this would drink about 7 oz of liquor per day      Review of Systems  Cardiovascular: Negative for chest pain.  Neurological: Negative for headaches.  All other systems reviewed and are negative.   10 Systems reviewed and are negative for acute change except as noted in the HPI.  Allergies  Review of patient's allergies indicates no known allergies.  Home Medications   Current Outpatient Rx  Name Route Sig Dispense Refill  . BUPROPION HCL ER (XL) 150 MG PO TB24  TAKE 1 TABLET BY MOUTH ONCE A DAY X 3 DAYS THEN INCREASE TO TWO TIMES A DAY 60 tablet 3  . CARVEDILOL 6.25 MG PO TABS Oral Take 1 tablet (6.25 mg total)  by mouth 2 (two) times daily with a meal. 60 tablet 3  . FUROSEMIDE 40 MG PO TABS Oral Take 40 mg by mouth daily.     Marland Kitchen LISINOPRIL 20 MG PO TABS Oral Take 20 mg by mouth daily.      Marland Kitchen AMLODIPINE BESYLATE 5 MG PO TABS Oral Take 5 mg by mouth daily.      Marland Kitchen CLONIDINE HCL 0.2 MG PO TABS Oral Take 0.2 mg by mouth 2 (two) times daily.      . COLESEVELAM HCL 625 MG PO TABS Oral Take 1,250 mg by mouth 2 (two) times daily.      . CRESTOR 40 MG PO TABS  TAKE ONE TABLET BY MOUTH DAILY. 30 tablet 3  . ASPIRIN-DIPYRIDAMOLE 25-200 MG PO CP12 Oral Take 1 capsule by mouth 2 (two) times daily.      Marland Kitchen FLUTICASONE PROPIONATE 0.05 % EX CREA Topical Apply 1 application topically 2 (two) times daily as needed. For psoriasis    . FOLIC ACID 1 MG PO TABS Oral Take 1 mg by mouth daily.     Marland Kitchen METOPROLOL SUCCINATE ER 50 MG PO TB24 Oral Take 50 mg by mouth daily.      Marland Kitchen NIACIN ER (ANTIHYPERLIPIDEMIC) 500 MG PO TBCR Oral Take 500 mg by mouth at bedtime.      Marland Kitchen NITROSTAT 0.4 MG SL SUBL Sublingual Place 0.4 mg under the tongue every 5 (five) minutes as needed. For chest pain    . OMEPRAZOLE 20 MG PO CPDR Oral Take 20 mg by mouth daily.      Marland Kitchen POLYETHYLENE GLYCOL 3350 PO POWD Oral Take 17 g by mouth daily as needed. 1 po bid 255 g 11  . PROCTOFOAM HC 1-1 % RE FOAM Rectal Place 1 applicator rectally 2 (two) times daily as needed.     . TERAZOSIN HCL 10 MG PO CAPS Oral Take 10 mg by mouth at bedtime.        BP 169/88  Pulse 77  Temp(Src) 98.4 F (36.9 C) (Oral)  Resp 16  Ht 6\' 3"  (1.905 m)  Wt 175 lb (79.379 kg)  BMI 21.87 kg/m2  Physical Exam  Nursing note and vitals reviewed. Constitutional: He is oriented to person, place, and time. He appears well-developed and well-nourished.  HENT:  Head: Normocephalic and atraumatic.       Dry mucus membranes  Eyes: Conjunctivae and EOM are normal. No scleral icterus.  Neck: Neck supple. No thyromegaly present.  Cardiovascular: Normal rate and regular rhythm.  Exam reveals  no gallop and no friction rub.   No murmur heard. Pulmonary/Chest: No stridor. He has no wheezes. He has no rales. He exhibits no tenderness.  Abdominal: He exhibits no  distension. There is no tenderness. There is no rebound.  Musculoskeletal: Normal range of motion. He exhibits tenderness (left knee). He exhibits no edema.       Pain on flexion of right knee  Lymphadenopathy:    He has no cervical adenopathy.  Neurological: He is oriented to person, place, and time. Coordination normal.  Skin: No rash noted. No erythema.  Psychiatric: He has a normal mood and affect. His behavior is normal.    ED Course  Procedures (including critical care time)   COORDINATION OF CARE:  9:30PM EDP discussed pt ED treatment course with pt and pt family  9:30 EDP ordered medication: NaCl bolus 1000 mL  Labs Reviewed  COMPREHENSIVE METABOLIC PANEL - Abnormal; Notable for the following:    Glucose, Bld 110 (*)    Creatinine, Ser 1.85 (*)    Albumin 3.4 (*)    GFR calc non Af Amer 36 (*)    GFR calc Af Amer 42 (*)    All other components within normal limits  CBC  DIFFERENTIAL  URINALYSIS, ROUTINE W REFLEX MICROSCOPIC   Dg Knee Complete 4 Views Left  04/26/2011  *RADIOLOGY REPORT*  Clinical Data: Pain without trauma  LEFT KNEE - COMPLETE 4+ VIEW  Comparison: None.  Findings: No effusion in the suprapatellar bursa.  Small marginal spurs about all three compartments of the knee.  Normal alignment and mineralization.  Negative for fracture, dislocation, or other acute abnormality.  IMPRESSION:  1.  Negative for fracture or acute abnormality. 2.  Early tricompartmental degenerative spurring with effusion.  Original Report Authenticated By: Osa Craver, M.D.     No diagnosis found.    MDM  Pt with loose stools  causing dehydration.    The chart was scribed for me under my direct supervision.  I personally performed the history, physical, and medical decision making and all procedures  in the evaluation of this patient.Greg Lennert, MD 04/26/11 [phone]

## 2011-04-26 NOTE — ED Notes (Signed)
My knees are giving out".  Urine is dark brown.

## 2011-04-26 NOTE — ED Notes (Signed)
Gave patient a blanket as requested. 

## 2011-04-26 NOTE — ED Notes (Signed)
Pt alert & oriented x4, stable gait. Pt given discharge instructions, paperwork, pt verbalized understanding. Pt left department w/ no further questions.  

## 2011-05-23 ENCOUNTER — Other Ambulatory Visit: Payer: Self-pay | Admitting: Cardiology

## 2011-06-01 ENCOUNTER — Other Ambulatory Visit: Payer: Self-pay | Admitting: *Deleted

## 2011-06-01 ENCOUNTER — Encounter: Payer: Self-pay | Admitting: Orthopedic Surgery

## 2011-06-01 ENCOUNTER — Ambulatory Visit (INDEPENDENT_AMBULATORY_CARE_PROVIDER_SITE_OTHER): Payer: Private Health Insurance - Indemnity | Admitting: Orthopedic Surgery

## 2011-06-01 VITALS — BP 130/70 | Ht 75.0 in | Wt 175.0 lb

## 2011-06-01 DIAGNOSIS — M6289 Other specified disorders of muscle: Secondary | ICD-10-CM

## 2011-06-01 DIAGNOSIS — M25569 Pain in unspecified knee: Secondary | ICD-10-CM

## 2011-06-01 DIAGNOSIS — M25562 Pain in left knee: Secondary | ICD-10-CM | POA: Insufficient documentation

## 2011-06-01 DIAGNOSIS — M25561 Pain in right knee: Secondary | ICD-10-CM

## 2011-06-01 DIAGNOSIS — R29898 Other symptoms and signs involving the musculoskeletal system: Secondary | ICD-10-CM

## 2011-06-01 NOTE — Progress Notes (Signed)
Knee  Injection Procedure Note  Pre-operative Diagnosis: left knee oa  Post-operative Diagnosis: same  Indications: pain  Anesthesia: ethyl chloride   Procedure Details   Verbal consent was obtained for the procedure. Time out was completed.The joint was prepped with alcohol, followed by  Ethyl chloride spray and A 20 gauge needle was inserted into the knee via lateral approach; 4ml 1% lidocaine and 1 ml of depomedrol  was then injected into the joint . The needle was removed and the area cleansed and dressed.  Complications:  None; patient tolerated the procedure well. Knee  Injection Procedure Note  Pre-operative Diagnosis: right knee oa  Post-operative Diagnosis: same  Indications: pain  Anesthesia: ethyl chloride   Procedure Details   Verbal consent was obtained for the procedure. Time out was completed.The joint was prepped with alcohol, followed by  Ethyl chloride spray and A 20 gauge needle was inserted into the knee via lateral approach; 4ml 1% lidocaine and 1 ml of depomedrol  was then injected into the joint . The needle was removed and the area cleansed and dressed.  Complications:  None; patient tolerated the procedure well.  

## 2011-06-01 NOTE — Patient Instructions (Signed)
Consult dr Gerilyn Pilgrim: recent onset proximal weakness   PT home health Gait training, lower extremity strengthening   You have received a steroid shot. 15% of patients experience increased pain at the injection site with in the next 24 hours. This is best treated with ice and tylenol extra strength 2 tabs every 8 hours. If you are still having pain please call the office.

## 2011-06-01 NOTE — Progress Notes (Signed)
Patient ID: [name], male   DOB: [birth year], 66 y.o.   MRN: [id number] Chief Complaint  Patient presents with  . Knee Pain    bilateral knee pain x 6 weeks, no known injury (right worse)   Past Medical History  Diagnosis Date  . Hypertension   . COPD (chronic obstructive pulmonary disease)   . Hyperlipidemia   . Cardiomyopathy     EF of 30% per echo in June of 2012; admitted with congestive heart failure; nonobstructive CAD on cath in 08/2010  . History of noncompliance with medical treatment   . Cerebrovascular disease 2011    CVA  in 02/2010-only deficit is decreased vision in  right eye  . Chronic kidney disease     creatinine-1.7 in 08/2010  . Alcohol abuse   . Tobacco abuse     40 pack years  . Abdominal aortic aneurysm     Stent graft repair  . Stroke 2011    decreased vision of right eye  . CHF (congestive heart failure)   . Leg pain   . Renal disorder     Past Surgical History  Procedure Date  . Abdominal aortic aneurysm repair w/ endoluminal graft     2 yrs ago-[place]  . Colonoscopy w/ polypectomy 2006    Dr. Katrinka Blazing: anal fissure, sessile adenomatous polyp, diverticulosis  . Esophagogastroduodenoscopy 11/15/2010    Procedure: ESOPHAGOGASTRODUODENOSCOPY (EGD);  Surgeon: Arlyce Harman, MD;  Location: [facility];  Service: Endoscopy;  Laterality: N/A;  with propofol sedation; procedure start @ 0813  . Flexible sigmoidoscopy 11/15/2010    Procedure: FLEXIBLE SIGMOIDOSCOPY;  Surgeon: Arlyce Harman, MD;  Location: [facility];  Service: Endoscopy;  Laterality: N/A;  with propofol sedation; ended @ 0808  . Polypectomy 12/13/2010    Procedure: POLYPECTOMY;  Surgeon: Arlyce Harman, MD;  Location: [facility];  Service: Endoscopy;  Laterality: N/A;  right colon, cecal, transverse, and sigmoid polypectomy    History   Social History  . Marital Status: Married    Spouse Name: N/A    Number of Children: N/A  . Years of Education: N/A   Occupational History  . Not on file.    Social History Main Topics  . Smoking status: Current Everyday Smoker -- 0.5 packs/day for 50 years    Types: Cigarettes  . Smokeless tobacco: Never Used  . Alcohol Use: 16.8 oz/week    28 Shots of liquor per week     quit about 1 month ago, prior to this would drink about 7 oz of liquor per day  . Drug Use: No  . Sexually Active: No   Other Topics Concern  . Not on file   Social History Narrative  . No narrative on file    Review of Systems  Constitutional: Positive for weight loss.  Musculoskeletal: Positive for myalgias, joint pain and falls.  Neurological: Positive for focal weakness.  Psychiatric/Behavioral: Positive for depression.    Physical Exam GENERAL: normal development , seems thin (wt loss)   CDV: no edema peripherally   Skin: abnormal, dry, lack of hair on legs   Lymph: nodes were not palpable/normal  Psychiatric: awake, alert and oriented  Neuro: normal sensation in legs   MSK   He has bilateral proximal thigh weakness.  He has tenderness in both medial and lateral joint lines of both knees.  Knee flexion on the RIGHT is 100 active.  Knee flexion on the LEFT is 115.  There is a painful knee  extension on the RIGHT normal on the LEFT.  He has weakness in his quads as well.  Both knees are stable.  The RIGHT knee appears to have a small joint effusion   Assessment: Bilateral knee pain with osteoarthritis.  He also has proximal weakness in his lower extremities, he has weight loss, is depressed.  Assessment is proximal weakness and all of a sudden  Plan: He is obvious and not a surgical candidate.  We cannot really do much for him at this point.  Recommend neurology workup, To assess his proximal weakness in recent inability to walk which is not explained by his osteoarthritis.  Continue Ultracet for pain.  Recommend physical therapy.

## 2011-06-01 NOTE — Progress Notes (Signed)
X-ray report  RIGHT knee  RIGHT knee pain  X-ray shows reasonable alignment.  Mild to moderate osteoarthritis with symmetric joint space narrowing but the most significant features osteopenia from disuse  Impression mild to moderate osteoarthritis RIGHT knee

## 2011-06-15 ENCOUNTER — Telehealth: Payer: Self-pay | Admitting: Orthopedic Surgery

## 2011-06-15 NOTE — Telephone Encounter (Signed)
Received faxed notes and spoke with Selena Batten, Production designer, theatre/television/film, [facility].  She verified that they have been providing the home therapy (strengthening and gait training) per visit order 06/01/11.  [facility] care had forwarded the order to them due to patient's insurance.  Kim relayed that patient will be away next week, therefore, therapy visits will resume the following week. Their ph# is [phone], fax # [phone], location is [address], [address], [place], [place]  [address].

## 2011-10-01 ENCOUNTER — Emergency Department (HOSPITAL_COMMUNITY): Payer: Medicare Other

## 2011-10-01 ENCOUNTER — Encounter (HOSPITAL_COMMUNITY): Payer: Self-pay

## 2011-10-01 ENCOUNTER — Inpatient Hospital Stay (HOSPITAL_COMMUNITY)
Admission: EM | Admit: 2011-10-01 | Discharge: 2011-10-07 | DRG: 248 | Disposition: A | Discharge status: Home / Self Care | Payer: Medicare Other | Attending: Cardiology | Admitting: Cardiology

## 2011-10-01 DIAGNOSIS — F17200 Nicotine dependence, unspecified, uncomplicated: Secondary | ICD-10-CM | POA: Diagnosis present

## 2011-10-01 DIAGNOSIS — Z720 Tobacco use: Secondary | ICD-10-CM | POA: Diagnosis present

## 2011-10-01 DIAGNOSIS — I5022 Chronic systolic (congestive) heart failure: Secondary | ICD-10-CM

## 2011-10-01 DIAGNOSIS — H538 Other visual disturbances: Secondary | ICD-10-CM | POA: Diagnosis present

## 2011-10-01 DIAGNOSIS — Z91199 Patient's noncompliance with other medical treatment and regimen due to unspecified reason: Secondary | ICD-10-CM

## 2011-10-01 DIAGNOSIS — I219 Acute myocardial infarction, unspecified: Secondary | ICD-10-CM

## 2011-10-01 DIAGNOSIS — I69998 Other sequelae following unspecified cerebrovascular disease: Secondary | ICD-10-CM

## 2011-10-01 DIAGNOSIS — N183 Chronic kidney disease, stage 3 unspecified: Secondary | ICD-10-CM

## 2011-10-01 DIAGNOSIS — I214 Non-ST elevation (NSTEMI) myocardial infarction: Principal | ICD-10-CM

## 2011-10-01 DIAGNOSIS — H539 Unspecified visual disturbance: Secondary | ICD-10-CM

## 2011-10-01 DIAGNOSIS — N184 Chronic kidney disease, stage 4 (severe): Secondary | ICD-10-CM | POA: Diagnosis present

## 2011-10-01 DIAGNOSIS — I509 Heart failure, unspecified: Secondary | ICD-10-CM | POA: Diagnosis present

## 2011-10-01 DIAGNOSIS — R079 Chest pain, unspecified: Secondary | ICD-10-CM

## 2011-10-01 DIAGNOSIS — Z955 Presence of coronary angioplasty implant and graft: Secondary | ICD-10-CM

## 2011-10-01 DIAGNOSIS — F1010 Alcohol abuse, uncomplicated: Secondary | ICD-10-CM | POA: Diagnosis present

## 2011-10-01 DIAGNOSIS — I129 Hypertensive chronic kidney disease with stage 1 through stage 4 chronic kidney disease, or unspecified chronic kidney disease: Secondary | ICD-10-CM | POA: Diagnosis not present

## 2011-10-01 DIAGNOSIS — J449 Chronic obstructive pulmonary disease, unspecified: Secondary | ICD-10-CM | POA: Diagnosis present

## 2011-10-01 DIAGNOSIS — E785 Hyperlipidemia, unspecified: Secondary | ICD-10-CM | POA: Diagnosis present

## 2011-10-01 DIAGNOSIS — I2510 Atherosclerotic heart disease of native coronary artery without angina pectoris: Secondary | ICD-10-CM

## 2011-10-01 DIAGNOSIS — E876 Hypokalemia: Secondary | ICD-10-CM

## 2011-10-01 DIAGNOSIS — Z79899 Other long term (current) drug therapy: Secondary | ICD-10-CM

## 2011-10-01 DIAGNOSIS — J4489 Other specified chronic obstructive pulmonary disease: Secondary | ICD-10-CM | POA: Diagnosis present

## 2011-10-01 DIAGNOSIS — J9600 Acute respiratory failure, unspecified whether with hypoxia or hypercapnia: Secondary | ICD-10-CM | POA: Diagnosis not present

## 2011-10-01 DIAGNOSIS — J069 Acute upper respiratory infection, unspecified: Secondary | ICD-10-CM

## 2011-10-01 DIAGNOSIS — Z7982 Long term (current) use of aspirin: Secondary | ICD-10-CM

## 2011-10-01 DIAGNOSIS — Z9119 Patient's noncompliance with other medical treatment and regimen: Secondary | ICD-10-CM

## 2011-10-01 DIAGNOSIS — I501 Left ventricular failure: Secondary | ICD-10-CM

## 2011-10-01 DIAGNOSIS — R9431 Abnormal electrocardiogram [ECG] [EKG]: Secondary | ICD-10-CM

## 2011-10-01 DIAGNOSIS — I428 Other cardiomyopathies: Secondary | ICD-10-CM | POA: Diagnosis present

## 2011-10-01 DIAGNOSIS — I5023 Acute on chronic systolic (congestive) heart failure: Secondary | ICD-10-CM | POA: Diagnosis not present

## 2011-10-01 HISTORY — DX: Acute myocardial infarction, unspecified: I21.9

## 2011-10-01 HISTORY — DX: Chronic systolic (congestive) heart failure: I50.22

## 2011-10-01 HISTORY — DX: Atherosclerotic heart disease of native coronary artery without angina pectoris: I25.10

## 2011-10-01 LAB — BASIC METABOLIC PANEL
Calcium: 9.9 mg/dL (ref 8.4–10.5)
GFR calc Af Amer: 42 mL/min — ABNORMAL LOW (ref >90)
GFR calc non Af Amer: 36 mL/min — ABNORMAL LOW (ref >90)
Glucose, Bld: 117 mg/dL — ABNORMAL HIGH (ref 70–99)
Potassium: 3.4 mEq/L — ABNORMAL LOW (ref 3.5–5.1)
Sodium: 141 mEq/L (ref 135–145)

## 2011-10-01 LAB — CBC
Hemoglobin: 14.5 g/dL (ref 13.0–17.0)
MCH: 33.3 pg (ref 26.0–34.0)
MCHC: 35.2 g/dL (ref 30.0–36.0)
Platelets: 150 10*3/uL (ref 150–400)
RDW: 12.7 % (ref 11.5–15.5)

## 2011-10-01 LAB — POCT I-STAT TROPONIN I: Troponin i, poc: 1.55 ng/mL (ref 0.00–0.08)

## 2011-10-01 LAB — APTT: aPTT: 31 seconds (ref 24–37)

## 2011-10-01 MED ORDER — ASPIRIN 81 MG PO CHEW
324.0000 mg | CHEWABLE_TABLET | Freq: Once | ORAL | Status: AC
  Administered 2011-10-01: 324 mg via ORAL
  Filled 2011-10-01: qty 4

## 2011-10-01 MED ORDER — NITROGLYCERIN 2 % TD OINT
1.0000 [in_us] | TOPICAL_OINTMENT | Freq: Once | TRANSDERMAL | Status: AC
  Administered 2011-10-01: 1 [in_us] via TOPICAL
  Filled 2011-10-01: qty 1

## 2011-10-01 MED ORDER — HEPARIN (PORCINE) IN NACL 100-0.45 UNIT/ML-% IJ SOLN
1000.0000 [IU]/h | INTRAMUSCULAR | Status: DC
  Administered 2011-10-01: 1000 [IU]/h via INTRAVENOUS
  Filled 2011-10-01: qty 250

## 2011-10-01 MED ORDER — HEPARIN BOLUS VIA INFUSION
4000.0000 [IU] | Freq: Once | INTRAVENOUS | Status: AC
  Administered 2011-10-01: 4000 [IU] via INTRAVENOUS

## 2011-10-01 NOTE — ED Notes (Signed)
Patient and spouse states that he does not take any medication on a regular basis. He states that he takes it at times but normally does not take it on a regular basis.

## 2011-10-01 NOTE — ED Notes (Signed)
MD at bedside. 

## 2011-10-01 NOTE — ED Provider Notes (Cosign Needed)
History   This chart was scribed for Ward Givens, MD by Sofie Rower. The patient was seen in room APA07/APA07 and the patient's care was started at 10:45 PM     CSN: [id number]  Arrival date & time 10/01/11  2223   None     Chief Complaint  Patient presents with  . Chest Pain    (Consider location/radiation/quality/duration/timing/severity/associated sxs/prior treatment) HPI  [name] is a 66 y.o. male who presents to the Emergency Department complaining of chest pain located at the center of the chest onset today (8:30PM). The pt informs the EDP that the chest pain is a dull pain which lasted 45 minutes. The pt reports that there was an additional chest pain which occurred last night, which last about 30 minutes. Pt rates the pain 10/10 during the incident this evening, 0/10 at present. Pt has a hx of chest pains (onset two weeks ago) that he does not relate to any activity that he does.   Pt denies sweats, nausea, shortness of breath.   PCP is Dr.Scott Luking.  Pt does not have a Development worker, international aid.    Past Medical History  Diagnosis Date  . Hypertension   . COPD (chronic obstructive pulmonary disease)   . Hyperlipidemia   . Cardiomyopathy     EF of 30% per echo in June of 2012; admitted with congestive heart failure; nonobstructive CAD on cath in 08/2010  . History of noncompliance with medical treatment   . Cerebrovascular disease 2011    CVA  in 02/2010-only deficit is decreased vision in  right eye  . Chronic kidney disease     creatinine-1.7 in 08/2010  . Alcohol abuse   . Tobacco abuse     40 pack years  . Abdominal aortic aneurysm     Stent graft repair  . Stroke 2011    decreased vision of right eye  . CHF (congestive heart failure)   . Leg pain   . Renal disorder     Past Surgical History  Procedure Date  . Abdominal aortic aneurysm repair w/ endoluminal graft     2 yrs ago-[place]  . Colonoscopy w/ polypectomy 2006    Dr. Katrinka Blazing: anal fissure,  sessile adenomatous polyp, diverticulosis  . Esophagogastroduodenoscopy 11/15/2010    Procedure: ESOPHAGOGASTRODUODENOSCOPY (EGD);  Surgeon: Arlyce Harman, MD;  Location: [facility];  Service: Endoscopy;  Laterality: N/A;  with propofol sedation; procedure start @ 0813  . Flexible sigmoidoscopy 11/15/2010    Procedure: FLEXIBLE SIGMOIDOSCOPY;  Surgeon: Arlyce Harman, MD;  Location: [facility];  Service: Endoscopy;  Laterality: N/A;  with propofol sedation; ended @ 0808  . Polypectomy 12/13/2010    Procedure: POLYPECTOMY;  Surgeon: Arlyce Harman, MD;  Location: [facility];  Service: Endoscopy;  Laterality: N/A;  right colon, cecal, transverse, and sigmoid polypectomy    Family History  Problem Relation Age of Onset  . Colon cancer Neg Hx   . Anesthesia problems Neg Hx   . Hypotension Neg Hx   . Malignant hyperthermia Neg Hx   . Pseudochol deficiency Neg Hx     History  Substance Use Topics  . Smoking status: Current Everyday Smoker -- 0.5 packs/day for 50 years    Types: Cigarettes  . Smokeless tobacco: Never Used  . Alcohol Use: 16.8 oz/week    28 Shots of liquor per week     quit about 1 month ago, prior to this would drink about 7 oz of liquor per day  Pt is a smoker.  Pt drinks occasionally.  Pt lives with his spouse.   Review of Systems  All other systems reviewed and are negative.    10 Systems reviewed and all are negative for acute change except as noted in the HPI.    Allergies  Review of patient's allergies indicates no known allergies.  Home Medications   Current Outpatient Rx  Name Route Sig Dispense Refill  . AMLODIPINE BESYLATE 5 MG PO TABS Oral Take 5 mg by mouth daily.      . ASPIRIN 81 MG PO TABS Oral Take 81 mg by mouth daily.    . BUPROPION HCL ER (XL) 150 MG PO TB24  TAKE 1 TABLET BY MOUTH ONCE A DAY X 3 DAYS THEN INCREASE TO TWO TIMES A DAY 60 tablet 3  . CARVEDILOL 6.25 MG PO TABS Oral Take 1 tablet (6.25 mg total) by mouth 2 (two) times daily with a meal.  60 tablet 3  . COLESEVELAM HCL 625 MG PO TABS Oral Take 1,250 mg by mouth 2 (two) times daily.      . CRESTOR 40 MG PO TABS  TAKE ONE TABLET BY MOUTH DAILY. 30 tablet 3  . CVS OMEGA-3 KRILL OIL 300 MG PO CAPS Oral Take 1 capsule by mouth.    . ASPIRIN-DIPYRIDAMOLE ER 25-200 MG PO CP12 Oral Take 1 capsule by mouth 2 (two) times daily.      Marland Kitchen FOLIC ACID 1 MG PO TABS Oral Take 1 mg by mouth daily.     . FUROSEMIDE 40 MG PO TABS Oral Take 40 mg by mouth daily.     Marland Kitchen LISINOPRIL 20 MG PO TABS Oral Take 20 mg by mouth daily.      Marland Kitchen TERAZOSIN HCL 10 MG PO CAPS Oral Take 10 mg by mouth at bedtime.        BP 187/71  Temp 99 F (37.2 C) (Oral)  Resp 16  Ht 6\' 3"  (1.905 m)  Wt 160 lb (72.576 kg)  BMI 20.00 kg/m2  SpO2 100%  Vital signs normal except hypertension   Physical Exam  Nursing note and vitals reviewed. Constitutional: He appears well-developed and well-nourished.  HENT:  Head: Normocephalic and atraumatic.  Left Ear: External ear normal.  Nose: Nose normal.       Edentulous  Eyes: Conjunctivae and EOM are normal. Pupils are equal, round, and reactive to light.  Neck: Normal range of motion. Neck supple.  Cardiovascular: Normal rate, regular rhythm and normal heart sounds.   No murmur heard. Pulmonary/Chest: Effort normal and breath sounds normal. He has no wheezes. He has no rales. He exhibits no tenderness.       Has bilateral lateral bony prominence of his lower sternum that he thinks is from a injury as a child  Abdominal: Soft. Bowel sounds are normal. There is no tenderness.  Musculoskeletal: Normal range of motion.  Neurological: He is alert.  Skin: Skin is warm and dry.  Psychiatric: He has a normal mood and affect. His behavior is normal.    ED Course  Procedures (including critical care time)   Medications  heparin ADULT infusion 100 units/mL (25000 units/250 mL) (1000 Units/hr Intravenous New Bag/Given 10/01/11 2342)  aspirin chewable tablet 324 mg (324 mg  Oral Given 10/01/11 2259)  nitroGLYCERIN (NITROGLYN) 2 % ointment 1 inch (1 inch Topical Given 10/01/11 2259)  heparin bolus via infusion 4,000 Units (4000 Units Intravenous Given 10/01/11 2342)     10:53PM- EDP at  bedside discusses treatment plan concerning administration of aspirin, laboratory examinations, evaluation of EKG results, possible transfer to cardiologist.   Review of prior records shows patient last saw Dr Dietrich Pates in August 2012   23:41 Dr Jolene Schimke accepts in transfer to [facility] tele for Dr Myrtis Ser.  Pt remains painfree.    Results for orders placed during the hospital encounter of 10/01/11  CBC      Component Value Range   WBC 7.2  4.0 - 10.5 K/uL   RBC 4.35  4.22 - 5.81 MIL/uL   Hemoglobin 14.5  13.0 - 17.0 g/dL   HCT 40.9  81.1 - 91.4 %   MCV 94.7  78.0 - 100.0 fL   MCH 33.3  26.0 - 34.0 pg   MCHC 35.2  30.0 - 36.0 g/dL   RDW 78.2  95.6 - 21.3 %   Platelets 150  150 - 400 K/uL  BASIC METABOLIC PANEL      Component Value Range   Sodium 141  135 - 145 mEq/L   Potassium 3.4 (*) 3.5 - 5.1 mEq/L   Chloride 105  96 - 112 mEq/L   CO2 26  19 - 32 mEq/L   Glucose, Bld 117 (*) 70 - 99 mg/dL   BUN 13  6 - 23 mg/dL   Creatinine, Ser 0.86 (*) 0.50 - 1.35 mg/dL   Calcium 9.9  8.4 - 57.8 mg/dL   GFR calc non Af Amer 36 (*) >90 mL/min   GFR calc Af Amer 42 (*) >90 mL/min  POCT I-STAT TROPONIN I      Component Value Range   Troponin i, poc 1.55 (*) 0.00 - 0.08 ng/mL   Comment 3           APTT      Component Value Range   aPTT 31  24 - 37 seconds  PROTIME-INR      Component Value Range   Prothrombin Time 13.2  11.6 - 15.2 seconds   INR 0.98  0.00 - 1.49   Laboratory interpretation all normal except mild hypokalemia, renal insufficiency, positive troponin   Dg Chest Portable 1 View  10/02/2011  *RADIOLOGY REPORT*  Clinical Data: Chest pain.  PORTABLE CHEST - 1 VIEW  Comparison: 07/07/2010 and 04/29/2010.  Findings: Cardiomegaly.  Tortuous aorta.  Central pulmonary  vascular prominence.  Slight increased markings mid right lung may represent confluence of shadows. The patient would eventually benefit from follow-up two- view chest with cardiac leads removed.  Left base subsegmental atelectasis.  IMPRESSION: Cardiomegaly.  Tortuous aorta.  Central pulmonary vascular prominence.  Slight increased markings mid right lung may represent confluence of shadows. The patient would eventually benefit from follow-up two- view chest with cardiac leads removed.  Left base subsegmental atelectasis.  Original Report Authenticated By: Fuller Canada, M.D.     Date: 10/01/2011  Rate: 60  Rhythm: sinus bradycardia and premature ventricular contractions (PVC)  QRS Axis: normal  Intervals: normal  ST/T Wave abnormalities: TWI Inferior leads and lateral leads.  Conduction Disutrbances:LVH  Narrative Interpretation:   Old EKG Reviewed: changes noted from 09/24/2010 SB rate 47, TW flat in inf leads     1. Chest pain   2. NSTEMI (non-ST elevated myocardial infarction)   3. Abnormal EKG     Plan transfer to [facility] for admission  Devoria Albe, MD, FACEP   MDM    I personally performed the services described in this documentation, which was scribed in my presence. The recorded information has been  reviewed and considered.  Devoria Albe, MD, FACEP    Ward Givens, MD 10/02/11 [phone]

## 2011-10-02 ENCOUNTER — Inpatient Hospital Stay (HOSPITAL_COMMUNITY): Payer: Medicare Other

## 2011-10-02 ENCOUNTER — Encounter (HOSPITAL_COMMUNITY): Payer: Self-pay | Admitting: *Deleted

## 2011-10-02 DIAGNOSIS — I214 Non-ST elevation (NSTEMI) myocardial infarction: Secondary | ICD-10-CM | POA: Diagnosis present

## 2011-10-02 DIAGNOSIS — E876 Hypokalemia: Secondary | ICD-10-CM

## 2011-10-02 DIAGNOSIS — I359 Nonrheumatic aortic valve disorder, unspecified: Secondary | ICD-10-CM

## 2011-10-02 DIAGNOSIS — R079 Chest pain, unspecified: Secondary | ICD-10-CM

## 2011-10-02 LAB — APTT: aPTT: 74 seconds — ABNORMAL HIGH (ref 24–37)

## 2011-10-02 LAB — CARDIAC PANEL(CRET KIN+CKTOT+MB+TROPI)
CK, MB: 21 ng/mL (ref 0.3–4.0)
Relative Index: 7 — ABNORMAL HIGH (ref 0.0–2.5)
Relative Index: 7.1 — ABNORMAL HIGH (ref 0.0–2.5)
Relative Index: 5.8 — ABNORMAL HIGH (ref 0.0–2.5)
Total CK: 300 U/L — ABNORMAL HIGH (ref 7–232)
Total CK: 332 U/L — ABNORMAL HIGH (ref 7–232)

## 2011-10-02 LAB — TSH: TSH: 0.625 u[IU]/mL (ref 0.350–4.500)

## 2011-10-02 LAB — LIPID PANEL
HDL: 39 mg/dL — ABNORMAL LOW (ref >39)
LDL Cholesterol: 149 mg/dL — ABNORMAL HIGH (ref 0–99)
Total CHOL/HDL Ratio: 5.2 RATIO

## 2011-10-02 LAB — HEPARIN LEVEL (UNFRACTIONATED): Heparin Unfractionated: 0.17 IU/mL — ABNORMAL LOW (ref 0.30–0.70)

## 2011-10-02 LAB — PROTIME-INR: Prothrombin Time: 15.1 seconds (ref 11.6–15.2)

## 2011-10-02 MED ORDER — ONDANSETRON HCL 4 MG/2ML IJ SOLN: 4.0000 mg | Freq: Four times a day (QID) | INTRAMUSCULAR | Status: DC | PRN

## 2011-10-02 MED ORDER — AMLODIPINE BESYLATE 5 MG PO TABS
5.0000 mg | ORAL_TABLET | Freq: Every day | ORAL | Status: DC
  Administered 2011-10-02 – 2011-10-07 (×5): 5 mg via ORAL
  Filled 2011-10-02 (×7): qty 1

## 2011-10-02 MED ORDER — ATORVASTATIN CALCIUM 80 MG PO TABS
80.0000 mg | ORAL_TABLET | Freq: Every day | ORAL | Status: DC
  Administered 2011-10-02 – 2011-10-06 (×5): 80 mg via ORAL
  Filled 2011-10-02 (×7): qty 1

## 2011-10-02 MED ORDER — ASPIRIN 81 MG PO CHEW: 324.0000 mg | CHEWABLE_TABLET | ORAL | Status: DC

## 2011-10-02 MED ORDER — SODIUM CHLORIDE 0.9 % IJ SOLN: 3.0000 mL | INTRAMUSCULAR | Status: DC | PRN

## 2011-10-02 MED ORDER — NITROGLYCERIN 0.4 MG SL SUBL: 0.4000 mg | SUBLINGUAL_TABLET | SUBLINGUAL | Status: DC | PRN

## 2011-10-02 MED ORDER — ATORVASTATIN CALCIUM 40 MG PO TABS
40.0000 mg | ORAL_TABLET | Freq: Every day | ORAL | Status: DC
  Filled 2011-10-02: qty 1

## 2011-10-02 MED ORDER — ATORVASTATIN CALCIUM 80 MG PO TABS: 80.0000 mg | ORAL_TABLET | ORAL | Status: DC

## 2011-10-02 MED ORDER — ASPIRIN EC 81 MG PO TBEC
81.0000 mg | DELAYED_RELEASE_TABLET | Freq: Every day | ORAL | Status: DC
  Administered 2011-10-02: 81 mg via ORAL
  Filled 2011-10-02: qty 1

## 2011-10-02 MED ORDER — COLESEVELAM HCL 625 MG PO TABS
1250.0000 mg | ORAL_TABLET | Freq: Two times a day (BID) | ORAL | Status: DC
  Administered 2011-10-02 – 2011-10-07 (×10): 1250 mg via ORAL
  Filled 2011-10-02 (×15): qty 2

## 2011-10-02 MED ORDER — LISINOPRIL 20 MG PO TABS
20.0000 mg | ORAL_TABLET | Freq: Every day | ORAL | Status: DC
  Filled 2011-10-02: qty 1

## 2011-10-02 MED ORDER — ASPIRIN 81 MG PO TABS: 81.0000 mg | ORAL_TABLET | Freq: Every day | ORAL | Status: DC

## 2011-10-02 MED ORDER — SODIUM CHLORIDE 0.9 % IV SOLN
INTRAVENOUS | Status: DC
  Administered 2011-10-03: 06:00:00 via INTRAVENOUS

## 2011-10-02 MED ORDER — SODIUM CHLORIDE 0.9 % IJ SOLN
3.0000 mL | Freq: Two times a day (BID) | INTRAMUSCULAR | Status: DC
  Administered 2011-10-02: 3 mL via INTRAVENOUS

## 2011-10-02 MED ORDER — ASPIRIN EC 81 MG PO TBEC
81.0000 mg | DELAYED_RELEASE_TABLET | Freq: Every day | ORAL | Status: DC
  Administered 2011-10-05 – 2011-10-07 (×3): 81 mg via ORAL
  Filled 2011-10-02 (×4): qty 1

## 2011-10-02 MED ORDER — TICAGRELOR 90 MG PO TABS
90.0000 mg | ORAL_TABLET | Freq: Two times a day (BID) | ORAL | Status: DC
  Administered 2011-10-02 – 2011-10-07 (×9): 90 mg via ORAL
  Filled 2011-10-02 (×12): qty 1

## 2011-10-02 MED ORDER — HEPARIN BOLUS VIA INFUSION
2000.0000 [IU] | Freq: Once | INTRAVENOUS | Status: AC
  Administered 2011-10-02: 2000 [IU] via INTRAVENOUS
  Filled 2011-10-02: qty 2000

## 2011-10-02 MED ORDER — ACETAMINOPHEN 325 MG PO TABS: 650.0000 mg | ORAL_TABLET | ORAL | Status: DC | PRN

## 2011-10-02 MED ORDER — DIAZEPAM 5 MG PO TABS: 5.0000 mg | ORAL_TABLET | ORAL | Status: DC

## 2011-10-02 MED ORDER — SODIUM CHLORIDE 0.9 % IV SOLN: 250.0000 mL | INTRAVENOUS | Status: DC | PRN

## 2011-10-02 MED ORDER — TICAGRELOR 90 MG PO TABS
180.0000 mg | ORAL_TABLET | Freq: Once | ORAL | Status: AC
  Administered 2011-10-02: 180 mg via ORAL
  Filled 2011-10-02: qty 2

## 2011-10-02 MED ORDER — ATORVASTATIN CALCIUM 80 MG PO TABS: 80.0000 mg | ORAL_TABLET | Freq: Every day | ORAL | Status: DC

## 2011-10-02 MED ORDER — POTASSIUM CHLORIDE CRYS ER 20 MEQ PO TBCR
40.0000 meq | EXTENDED_RELEASE_TABLET | Freq: Once | ORAL | Status: AC
  Administered 2011-10-02: 40 meq via ORAL
  Filled 2011-10-02: qty 2

## 2011-10-02 MED ORDER — HEPARIN (PORCINE) IN NACL 100-0.45 UNIT/ML-% IJ SOLN
1450.0000 [IU]/h | INTRAMUSCULAR | Status: DC
  Administered 2011-10-02: 1250 [IU]/h via INTRAVENOUS
  Administered 2011-10-02 – 2011-10-04 (×3): 1450 [IU]/h via INTRAVENOUS
  Filled 2011-10-02 (×5): qty 250

## 2011-10-02 MED ORDER — CARVEDILOL 6.25 MG PO TABS
6.2500 mg | ORAL_TABLET | Freq: Two times a day (BID) | ORAL | Status: DC
  Administered 2011-10-02 – 2011-10-07 (×11): 6.25 mg via ORAL
  Filled 2011-10-02 (×15): qty 1

## 2011-10-02 NOTE — H&P (Signed)
[name] is an 66 y.o. male.    Primary Cardiologist: Dr. [place] Bing  Chief Complaint: chest pain  HPI: 66 y/o male with chest pain and abnormal troponin-I (poc) of 1.55.  He has a PMH of NICM, LVEF 30% by Echo from 08/2010 [facility] [place], [place]).  He reportedly had a cardiac cath 08/2010 as well which showed non-obstructive CAD.  He admits to medication non-compliance and has not been taking any of his medications.  He presented to [facility] with a 6/10 sharp chest pain that started at rest and lasted about 30 minutes before spontaneous relief.  He denies any nausea or vomiting, radiation, shortness of breath.  In addition, he denies PND, orthopnea, palpitation or syncope.  In the ED, his ECG showed sinus bradycardiac (56 bpm) with PACs, LVH by voltage criteria and inferolateral T-wave inversion. He is still smokes tobacco and does not appear to want to quit.  Currently, he is chest pain free, and he is clinically and hemodynamically stable.  Past Medical History  Diagnosis Date  . Hypertension   . COPD (chronic obstructive pulmonary disease)   . Hyperlipidemia   . Cardiomyopathy     EF of 30% per echo in June of 2012; admitted with congestive heart failure; nonobstructive CAD on cath in 08/2010  . History of noncompliance with medical treatment   . Cerebrovascular disease 2011    CVA  in 02/2010-only deficit is decreased vision in  right eye  . Chronic kidney disease     creatinine-1.7 in 08/2010  . Alcohol abuse   . Tobacco abuse     40 pack years  . Abdominal aortic aneurysm     Stent graft repair  . Stroke 2011    decreased vision of right eye  . CHF (congestive heart failure)   . Leg pain   . Renal disorder     Past Surgical History  Procedure Date  . Abdominal aortic aneurysm repair w/ endoluminal graft     2 yrs ago-[place]  . Colonoscopy w/ polypectomy 2006    Dr. Katrinka Blazing: anal fissure, sessile adenomatous polyp, diverticulosis  .  Esophagogastroduodenoscopy 11/15/2010    Procedure: ESOPHAGOGASTRODUODENOSCOPY (EGD);  Surgeon: Arlyce Harman, MD;  Location: [facility];  Service: Endoscopy;  Laterality: N/A;  with propofol sedation; procedure start @ 0813  . Flexible sigmoidoscopy 11/15/2010    Procedure: FLEXIBLE SIGMOIDOSCOPY;  Surgeon: Arlyce Harman, MD;  Location: [facility];  Service: Endoscopy;  Laterality: N/A;  with propofol sedation; ended @ 0808  . Polypectomy 12/13/2010    Procedure: POLYPECTOMY;  Surgeon: Arlyce Harman, MD;  Location: [facility];  Service: Endoscopy;  Laterality: N/A;  right colon, cecal, transverse, and sigmoid polypectomy    Family History  Problem Relation Age of Onset  . Colon cancer Neg Hx   . Anesthesia problems Neg Hx   . Hypotension Neg Hx   . Malignant hyperthermia Neg Hx   . Pseudochol deficiency Neg Hx    Social History:  reports that he has been smoking Cigarettes.  He has a 25 pack-year smoking history. He has never used smokeless tobacco. He reports that he drinks about 16.8 ounces of alcohol per week. He reports that he does not use illicit drugs.  Allergies: No Known Allergies  Medications Prior to Admission  Medication Sig Dispense Refill  . amLODipine (NORVASC) 5 MG tablet Take 5 mg by mouth daily.        Marland Kitchen aspirin 81 MG tablet Take  81 mg by mouth daily.      Marland Kitchen buPROPion (WELLBUTRIN XL) 150 MG 24 hr tablet TAKE 1 TABLET BY MOUTH ONCE A DAY X 3 DAYS THEN INCREASE TO TWO TIMES A DAY  60 tablet  3  . carvedilol (COREG) 6.25 MG tablet Take 1 tablet (6.25 mg total) by mouth 2 (two) times daily with a meal.  60 tablet  3  . colesevelam (WELCHOL) 625 MG tablet Take 1,250 mg by mouth 2 (two) times daily.        . CRESTOR 40 MG tablet TAKE ONE TABLET BY MOUTH DAILY.  30 tablet  3  . CVS OMEGA-3 KRILL OIL 300 MG CAPS Take 1 capsule by mouth.      . dipyridamole-aspirin (AGGRENOX) 25-200 MG per 12 hr capsule Take 1 capsule by mouth 2 (two) times daily.        . folic acid (FOLVITE) 1 MG tablet  Take 1 mg by mouth daily.       . furosemide (LASIX) 40 MG tablet Take 40 mg by mouth daily.       Marland Kitchen lisinopril (PRINIVIL,ZESTRIL) 20 MG tablet Take 20 mg by mouth daily.        Marland Kitchen terazosin (HYTRIN) 10 MG capsule Take 10 mg by mouth at bedtime.          Results for orders placed during the hospital encounter of 10/01/11 (from the past 48 hour(s))  CBC     Status: Normal   Collection Time   10/01/11 10:30 PM      Component Value Range Comment   WBC 7.2  4.0 - 10.5 K/uL    RBC 4.35  4.22 - 5.81 MIL/uL    Hemoglobin 14.5  13.0 - 17.0 g/dL    HCT 16.1  09.6 - 04.5 %    MCV 94.7  78.0 - 100.0 fL    MCH 33.3  26.0 - 34.0 pg    MCHC 35.2  30.0 - 36.0 g/dL    RDW 40.9  81.1 - 91.4 %    Platelets 150  150 - 400 K/uL   BASIC METABOLIC PANEL     Status: Abnormal   Collection Time   10/01/11 10:30 PM      Component Value Range Comment   Sodium 141  135 - 145 mEq/L    Potassium 3.4 (*) 3.5 - 5.1 mEq/L    Chloride 105  96 - 112 mEq/L    CO2 26  19 - 32 mEq/L    Glucose, Bld 117 (*) 70 - 99 mg/dL    BUN 13  6 - 23 mg/dL    Creatinine, Ser 7.82 (*) 0.50 - 1.35 mg/dL    Calcium 9.9  8.4 - 95.6 mg/dL    GFR calc non Af Amer 36 (*) >90 mL/min    GFR calc Af Amer 42 (*) >90 mL/min   APTT     Status: Normal   Collection Time   10/01/11 10:35 PM      Component Value Range Comment   aPTT 31  24 - 37 seconds   PROTIME-INR     Status: Normal   Collection Time   10/01/11 10:35 PM      Component Value Range Comment   Prothrombin Time 13.2  11.6 - 15.2 seconds    INR 0.98  0.00 - 1.49   POCT I-STAT TROPONIN I     Status: Abnormal   Collection Time   10/01/11 10:49 PM  Component Value Range Comment   Troponin i, poc 1.55 (*) 0.00 - 0.08 ng/mL    Comment 3             Dg Chest Portable 1 View  10/02/2011  *RADIOLOGY REPORT*  Clinical Data: Chest pain.  PORTABLE CHEST - 1 VIEW  Comparison: 07/07/2010 and 04/29/2010.  Findings: Cardiomegaly.  Tortuous aorta.  Central pulmonary vascular  prominence.  Slight increased markings mid right lung may represent confluence of shadows. The patient would eventually benefit from follow-up two- view chest with cardiac leads removed.  Left base subsegmental atelectasis.  IMPRESSION: Cardiomegaly.  Tortuous aorta.  Central pulmonary vascular prominence.  Slight increased markings mid right lung may represent confluence of shadows. The patient would eventually benefit from follow-up two- view chest with cardiac leads removed.  Left base subsegmental atelectasis.  Original Report Authenticated By: Fuller Canada, M.D.    Review of Systems  Constitutional: Negative for fever, chills, weight loss, malaise/fatigue and diaphoresis.  HENT: Negative for hearing loss, ear pain, nosebleeds, congestion, neck pain, tinnitus and ear discharge.   Eyes: Negative for blurred vision, double vision, photophobia, pain, discharge and redness.  Respiratory: Negative for cough, hemoptysis, sputum production, shortness of breath, wheezing and stridor.   Cardiovascular: Positive for chest pain. Negative for palpitations, orthopnea, claudication, leg swelling and PND.  Gastrointestinal: Negative for heartburn, nausea, vomiting, abdominal pain, diarrhea and constipation.  Genitourinary: Negative for dysuria, urgency, frequency and hematuria.  Musculoskeletal: Negative for myalgias, back pain and joint pain.  Skin: Negative for itching and rash.  Neurological: Negative for dizziness, tingling, tremors, sensory change, speech change, focal weakness, seizures, weakness and headaches.  Psychiatric/Behavioral: Negative for suicidal ideas and hallucinations.    Blood pressure 168/82, pulse 64, temperature 98.5 F (36.9 C), temperature source Oral, resp. rate 20, height 6\' 3"  (1.905 m), weight 76.295 kg (168 lb 3.2 oz), SpO2 96.00%. Physical Exam  Constitutional: He is oriented to person, place, and time. He appears well-developed and well-nourished. No distress.  HENT:    Head: Atraumatic.  Eyes: EOM are normal. Right eye exhibits no discharge. Left eye exhibits no discharge. No scleral icterus.  Neck: No JVD present. No tracheal deviation present.  Cardiovascular: Normal rate, regular rhythm, normal heart sounds and intact distal pulses.  Exam reveals no friction rub.   No murmur heard. Respiratory: Breath sounds normal. No stridor. No respiratory distress. He has no wheezes. He has no rales. He exhibits no tenderness.  GI: He exhibits no distension. There is no tenderness. There is no rebound and no guarding.  Musculoskeletal: He exhibits no edema and no tenderness.  Neurological: He is alert and oriented to person, place, and time. No cranial nerve deficit. Coordination normal.  Skin: Skin is dry. No rash noted. He is not diaphoretic. No erythema. No pallor.  Psychiatric: He has a normal mood and affect.     Assessment/Plan  1. NSTEMI 2. NICM, LVEF 30% 3. Medication non-compliance 4. HTN 5. Tobacco abuse  The patient is admitted to cardiology service where he is currently been observed on telemetry.  He is already on heparin drip and is currently chest pain free. If he develops recurrent chest pain, we may start Morphine or nitro drip.  We will obtain serial cardiac markers, and obtain a TTE to re-evaluate his LV function.  He has been restarted on his home medication which he promised to take.  We will re-evaluate his labs in the morning and determine if he needs a re-evaluation  of his coronary arteries.  Reino Lybbert E 10/02/2011, 2:25 AM

## 2011-10-02 NOTE — Progress Notes (Signed)
  Echocardiogram 2D Echocardiogram has been performed.  Emelia Loron 10/02/2011, 9:48 AM

## 2011-10-02 NOTE — Progress Notes (Signed)
ANTICOAGULATION CONSULT NOTE - Initial Consult  Pharmacy Consult for Heparin Indication: NSTEMI  No Known Allergies  Patient Measurements: Height: 6\' 3"  (190.5 cm) Weight: 168 lb 3.2 oz (76.295 kg) IBW/kg (Calculated) : 84.5  Heparin Dosing Weight: 76 kg Vital Signs: Temp: 98.5 F (36.9 C) (07/14 0218) Temp src: Oral (07/14 0218) BP: 168/82 mmHg (07/14 0218) Pulse Rate: 64  (07/14 0218)  Labs:  Basename 10/01/11 2235 10/01/11 2230  HGB -- 14.5  HCT -- 41.2  PLT -- 150  APTT 31 --  LABPROT 13.2 --  INR 0.98 --  HEPARINUNFRC -- --  CREATININE -- 1.86*  CKTOTAL -- --  CKMB -- --  TROPONINI -- --    Estimated Creatinine Clearance: 41.6 ml/min (by C-G formula based on Cr of 1.86).   Medical History: Past Medical History  Diagnosis Date  . Hypertension   . COPD (chronic obstructive pulmonary disease)   . Hyperlipidemia   . Cardiomyopathy     EF of 30% per echo in June of 2012; admitted with congestive heart failure; nonobstructive CAD on cath in 08/2010  . History of noncompliance with medical treatment   . Cerebrovascular disease 2011    CVA  in 02/2010-only deficit is decreased vision in  right eye  . Chronic kidney disease     creatinine-1.7 in 08/2010  . Alcohol abuse   . Tobacco abuse     40 pack years  . Abdominal aortic aneurysm     Stent graft repair  . Stroke 2011    decreased vision of right eye  . CHF (congestive heart failure)   . Leg pain   . Renal disorder     Medications:  Prescriptions prior to admission  Medication Sig Dispense Refill  . amLODipine (NORVASC) 5 MG tablet Take 5 mg by mouth daily.        Marland Kitchen aspirin 81 MG tablet Take 81 mg by mouth daily.      Marland Kitchen buPROPion (WELLBUTRIN XL) 150 MG 24 hr tablet TAKE 1 TABLET BY MOUTH ONCE A DAY X 3 DAYS THEN INCREASE TO TWO TIMES A DAY  60 tablet  3  . carvedilol (COREG) 6.25 MG tablet Take 1 tablet (6.25 mg total) by mouth 2 (two) times daily with a meal.  60 tablet  3  . colesevelam (WELCHOL)  625 MG tablet Take 1,250 mg by mouth 2 (two) times daily.        . CRESTOR 40 MG tablet TAKE ONE TABLET BY MOUTH DAILY.  30 tablet  3  . CVS OMEGA-3 KRILL OIL 300 MG CAPS Take 1 capsule by mouth.      . dipyridamole-aspirin (AGGRENOX) 25-200 MG per 12 hr capsule Take 1 capsule by mouth 2 (two) times daily.        . folic acid (FOLVITE) 1 MG tablet Take 1 mg by mouth daily.       . furosemide (LASIX) 40 MG tablet Take 40 mg by mouth daily.       Marland Kitchen lisinopril (PRINIVIL,ZESTRIL) 20 MG tablet Take 20 mg by mouth daily.        Marland Kitchen terazosin (HYTRIN) 10 MG capsule Take 10 mg by mouth at bedtime.          Assessment: 66 y.o. male presents with chest pain/+troponin - NSTEMI. Heparin started at [facility] ED [phone] with 4000 units bolus and 1000 units/hr.    Goal of Therapy:  Heparin level 0.3-0.7 units/ml Monitor platelets by anticoagulation protocol: Yes  Plan:  1. Continue heparin at 1000 units/hr 2. Will check heparin level 6 hour past start 3. Daily heparin level and CBC  Christoper Fabian, PharmD, BCPS Clinical pharmacist, pager [phone] 10/02/2011,3:15 AM

## 2011-10-02 NOTE — Progress Notes (Signed)
Lab called and notified nurse of elevated trop at 7.86 and CKMB at 21.0. Pt denies cp. Hep gtt infusing. Made md aware, no new orders given.

## 2011-10-02 NOTE — Progress Notes (Signed)
ANTICOAGULATION CONSULT NOTE - Initial Consult  Pharmacy Consult for Heparin Indication: NSTEMI  No Known Allergies  Patient Measurements: Height: 6\' 3"  (190.5 cm) Weight: 167 lb 15.9 oz (76.2 kg) IBW/kg (Calculated) : 84.5  Heparin Dosing Weight: 76 kg Vital Signs: Temp: 99 F (37.2 C) (07/14 1935) Temp src: Oral (07/14 1317) BP: 149/80 mmHg (07/14 1935) Pulse Rate: 67  (07/14 1935)  Labs:  Basename 10/02/11 1850 10/02/11 1530 10/02/11 1115 10/02/11 0804 10/02/11 0430 10/01/11 2235 10/01/11 2230  HGB -- -- -- -- -- -- 14.5  HCT -- -- -- -- -- -- 41.2  PLT -- -- -- -- -- -- 150  APTT -- -- -- -- 74* 31 --  LABPROT -- -- -- -- 15.1 13.2 --  INR -- -- -- -- 1.17 0.98 --  HEPARINUNFRC 0.12* -- 0.30 -- 0.17* -- --  CREATININE -- -- -- -- -- -- 1.86*  CKTOTAL -- 300* -- 332* 300* -- --  CKMB -- 17.5* -- 23.7* 21.0* -- --  TROPONINI -- 10.54* -- 11.41* 7.86* -- --    Estimated Creatinine Clearance: 41.5 ml/min (by C-G formula based on Cr of 1.86).   Assessment: 66 y.o. male presented with chest pain/+troponin -/+EKG changes= NSTEMI. Heparin started at [facility] ED.  Heparin running at 1250 uts/hr HL was 0.3 this am now on recheck 0.12 < goal, with no interuptions or IV problems per RN.     Cards: Has small AAA. H/o LV dysfunction. 2D Echo pending. BP slightly elevated 140/75, 73. Total chol = 201 and LDL 149. Meds: Norvasc, ASA 81mg , Lipitor, Coreg, Welchol, Brilinta  Neph: CRI with Scr 1.86.   Goal of Therapy:  Heparin level 0.3-0.7 units/ml Monitor platelets by anticoagulation protocol: Yes   Plan:  Heparin bolus 2000 uts IV x1 Increase heparin drip rate 1450 units/hr check heparin level in AM  [employer].D. CPP, BCPS Clinical Pharmacist [phone] 10/02/2011 8:43 PM

## 2011-10-02 NOTE — Progress Notes (Addendum)
Patient ID: [name], male   DOB: 01/19/1946, 66 y.o.   MRN: [id number]   SUBJECTIVE:  The patient is feeling well now. He was admitted early this morning after being transferred from [place]. He has marked EKG changes. His enzymes are abnormal.   Filed Vitals:   10/01/11 2346 10/02/11 0022 10/02/11 0218 10/02/11 0538  BP: 161/73 168/78 168/82 138/82  Pulse:   64 58  Temp: 98.7 F (37.1 C)  98.5 F (36.9 C) 98.3 F (36.8 C)  TempSrc: Oral  Oral Oral  Resp: 16 18 20 18   Height:      Weight:   168 lb 3.2 oz (76.295 kg)   SpO2: 100% 99% 96% 98%    Intake/Output Summary (Last 24 hours) at 10/02/11 0923 Last data filed at 10/02/11 0741  Gross per 24 hour  Intake    240 ml  Output    200 ml  Net     40 ml    LABS: Basic Metabolic Panel:  Basename 10/02/11 0430 10/01/11 2230  NA -- 141  K -- 3.4*  CL -- 105  CO2 -- 26  GLUCOSE -- 117*  BUN -- 13  CREATININE -- 1.86*  CALCIUM -- 9.9  MG 1.8 --  PHOS -- --   Liver Function Tests: No results found for this basename: AST:2,ALT:2,ALKPHOS:2,BILITOT:2,PROT:2,ALBUMIN:2 in the last 72 hours No results found for this basename: LIPASE:2,AMYLASE:2 in the last 72 hours CBC:  Basename 10/01/11 2230  WBC 7.2  NEUTROABS --  HGB 14.5  HCT 41.2  MCV 94.7  PLT 150   Cardiac Enzymes:  Basename 10/02/11 0804 10/02/11 0430  CKTOTAL 332* 300*  CKMB 23.7* 21.0*  CKMBINDEX -- --  TROPONINI 11.41* 7.86*   BNP: No components found with this basename: POCBNP:3 D-Dimer: No results found for this basename: DDIMER:2 in the last 72 hours Hemoglobin A1C: No results found for this basename: HGBA1C in the last 72 hours Fasting Lipid Panel:  Basename 10/02/11 0430  CHOL 201*  HDL 39*  LDLCALC 149*  TRIG 66  CHOLHDL 5.2  LDLDIRECT --   Thyroid Function Tests: No results found for this basename: TSH,T4TOTAL,FREET3,T3FREE,THYROIDAB in the last 72 hours  RADIOLOGY: Dg Chest Portable 1 View  10/02/2011  *RADIOLOGY  REPORT*  Clinical Data: Chest pain.  PORTABLE CHEST - 1 VIEW  Comparison: 07/07/2010 and 04/29/2010.  Findings: Cardiomegaly.  Tortuous aorta.  Central pulmonary vascular prominence.  Slight increased markings mid right lung may represent confluence of shadows. The patient would eventually benefit from follow-up two- view chest with cardiac leads removed.  Left base subsegmental atelectasis.  IMPRESSION: Cardiomegaly.  Tortuous aorta.  Central pulmonary vascular prominence.  Slight increased markings mid right lung may represent confluence of shadows. The patient would eventually benefit from follow-up two- view chest with cardiac leads removed.  Left base subsegmental atelectasis.  Original Report Authenticated By: Fuller Canada, M.D.    TELEMETRY: I personally reviewed telemetry October 02, 2011. There is sinus bradycardia with PACs.   ASSESSMENT AND PLAN:   Abdominal aortic aneurysm    This is being followed by vascular surgery and is relatively small.   Cardiomyopathy    Historically there is LV dysfunction. He has no signs of heart failure at this time.   Non-STEMI (non-ST elevated myocardial infarction)    The patient has significant enzyme elevation. The EKG changes currently present are new since last year. He had brief pain that resolved. He is on heparin. I plan to speak  with Dr. Excell Seltzer before deciding about Plavix in his case. The patient is stable. With no symptoms we will consider waiting until tomorrow to proceed with catheterization. However I will review this with Dr. Excell Seltzer.   Renal insufficiency    Current creatinine is 1.86. Review of old labs reveals that this is his baseline.   Hypokalemia    Potassium will be given.  I have written the Orders  ADDENDUM  11:03AM..Marland KitchenMarland KitchenReviewed with Dr. Excell Seltzer. Will load with Brillinta 180. Cath 7/15. Cath sooner if return of pain.  Willa Rough 10/02/2011 9:23 AM

## 2011-10-02 NOTE — Progress Notes (Signed)
ANTICOAGULATION CONSULT NOTE - Initial Consult  Pharmacy Consult for Heparin Indication: NSTEMI  No Known Allergies  Patient Measurements: Height: 6\' 3"  (190.5 cm) Weight: 168 lb 3.2 oz (76.295 kg) IBW/kg (Calculated) : 84.5  Heparin Dosing Weight: 76 kg Vital Signs: Temp: 98.5 F (36.9 C) (07/14 0218) Temp src: Oral (07/14 0218) BP: 168/82 mmHg (07/14 0218) Pulse Rate: 64  (07/14 0218)  Labs:  Basename 10/02/11 0430 10/01/11 2235 10/01/11 2230  HGB -- -- 14.5  HCT -- -- 41.2  PLT -- -- 150  APTT 74* 31 --  LABPROT 15.1 13.2 --  INR 1.17 0.98 --  HEPARINUNFRC 0.17* -- --  CREATININE -- -- 1.86*  CKTOTAL -- -- --  CKMB -- -- --  TROPONINI -- -- --    Estimated Creatinine Clearance: 41.6 ml/min (by C-G formula based on Cr of 1.86).   Assessment: 66 y.o. male presented with chest pain/+troponin - NSTEMI. Heparin started at [facility] ED [phone] with 4000 units bolus and 1000 units/hr.  First heparin level subtherapeutic (0.17) on 1000 units/hr. No issues with infusion per RN. No bleeding noted.   Goal of Therapy:  Heparin level 0.3-0.7 units/ml Monitor platelets by anticoagulation protocol: Yes   Plan:  1. Rebolus heparin 2000 units IV 2. Increase heparin to 1250 units/hr 3. Will check heparin level in 6 hour   Christoper Fabian, PharmD, BCPS Clinical pharmacist, pager [phone] 10/02/2011,5:36 AM

## 2011-10-02 NOTE — Progress Notes (Signed)
ANTICOAGULATION CONSULT NOTE - Initial Consult  Pharmacy Consult for Heparin Indication: NSTEMI  No Known Allergies  Patient Measurements: Height: 6\' 3"  (190.5 cm) Weight: 168 lb 3.2 oz (76.295 kg) IBW/kg (Calculated) : 84.5  Heparin Dosing Weight: 76 kg Vital Signs: Temp: 98.8 F (37.1 C) (07/14 1317) Temp src: Oral (07/14 1317) BP: 140/75 mmHg (07/14 1317) Pulse Rate: 73  (07/14 1317)  Labs:  Basename 10/02/11 1115 10/02/11 0804 10/02/11 0430 10/01/11 2235 10/01/11 2230  HGB -- -- -- -- 14.5  HCT -- -- -- -- 41.2  PLT -- -- -- -- 150  APTT -- -- 74* 31 --  LABPROT -- -- 15.1 13.2 --  INR -- -- 1.17 0.98 --  HEPARINUNFRC 0.30 -- 0.17* -- --  CREATININE -- -- -- -- 1.86*  CKTOTAL -- 332* 300* -- --  CKMB -- 23.7* 21.0* -- --  TROPONINI -- 11.41* 7.86* -- --    Estimated Creatinine Clearance: 41.6 ml/min (by C-G formula based on Cr of 1.86).   Assessment: 66 y.o. male presented with chest pain/+troponin -/+EKG changes= NSTEMI. Heparin started at [facility] ED.  Anticoag: Heparin level 0.3 now in goal range for CP.  Cards: Has small AAA. H/o LV dysfunction. 2D Echo pending. BP slightly elevated 140/75, 73. Total chol = 201 and LDL 149. Meds: Norvasc, ASA 81mg , Lipitor, Coreg, Welchol, Brilinta  Neph: CRI with Scr 1.86.   Goal of Therapy:  Heparin level 0.3-0.7 units/ml Monitor platelets by anticoagulation protocol: Yes   Plan:  Continue IV heparin at 1250 units/hr and recheck heparin level in 6 hrs to confirm therapeutic  Marigene Erler S. Merilynn Finland, PharmD, BCPS Clinical Staff Pharmacist Pager [phone]  10/02/2011,1:19 PM

## 2011-10-03 ENCOUNTER — Inpatient Hospital Stay (HOSPITAL_COMMUNITY): Payer: Medicare Other

## 2011-10-03 DIAGNOSIS — I214 Non-ST elevation (NSTEMI) myocardial infarction: Secondary | ICD-10-CM

## 2011-10-03 DIAGNOSIS — J069 Acute upper respiratory infection, unspecified: Secondary | ICD-10-CM

## 2011-10-03 DIAGNOSIS — J9600 Acute respiratory failure, unspecified whether with hypoxia or hypercapnia: Secondary | ICD-10-CM

## 2011-10-03 DIAGNOSIS — I501 Left ventricular failure: Secondary | ICD-10-CM

## 2011-10-03 LAB — CBC
HCT: 40.3 % (ref 39.0–52.0)
Hemoglobin: 14.2 g/dL (ref 13.0–17.0)
MCH: 33.1 pg (ref 26.0–34.0)
MCHC: 35.2 g/dL (ref 30.0–36.0)
MCV: 93.9 fL (ref 78.0–100.0)
RBC: 4.29 MIL/uL (ref 4.22–5.81)

## 2011-10-03 LAB — BASIC METABOLIC PANEL
BUN: 14 mg/dL (ref 6–23)
CO2: 24 mEq/L (ref 19–32)
Calcium: 9.7 mg/dL (ref 8.4–10.5)
GFR calc non Af Amer: 40 mL/min — ABNORMAL LOW (ref >90)
Glucose, Bld: 131 mg/dL — ABNORMAL HIGH (ref 70–99)
Sodium: 137 mEq/L (ref 135–145)

## 2011-10-03 LAB — CARDIAC PANEL(CRET KIN+CKTOT+MB+TROPI)
CK, MB: 8 ng/mL (ref 0.3–4.0)
Relative Index: 3.7 — ABNORMAL HIGH (ref 0.0–2.5)
Total CK: 217 U/L (ref 7–232)
Troponin I: 4.83 ng/mL (ref <0.30)
Troponin I: 4.01 ng/mL (ref <0.30)

## 2011-10-03 LAB — PRO B NATRIURETIC PEPTIDE: Pro B Natriuretic peptide (BNP): 15977 pg/mL — ABNORMAL HIGH (ref 0–125)

## 2011-10-03 MED ORDER — MORPHINE SULFATE 2 MG/ML IJ SOLN
INTRAMUSCULAR | Status: AC
  Administered 2011-10-03: 2 mg via INTRAMUSCULAR
  Filled 2011-10-03: qty 1

## 2011-10-03 MED ORDER — MORPHINE SULFATE 2 MG/ML IJ SOLN: 2.0000 mg | Freq: Once | INTRAMUSCULAR | Status: DC

## 2011-10-03 MED ORDER — NITROGLYCERIN IN D5W 200-5 MCG/ML-% IV SOLN
2.0000 ug/min | INTRAVENOUS | Status: DC
  Administered 2011-10-03: 10 ug/min via INTRAVENOUS

## 2011-10-03 MED ORDER — SODIUM CHLORIDE 0.9 % IV SOLN: INTRAVENOUS | Status: DC

## 2011-10-03 MED ORDER — LEVALBUTEROL HCL 1.25 MG/0.5ML IN NEBU
1.2500 mg | INHALATION_SOLUTION | Freq: Once | RESPIRATORY_TRACT | Status: DC
  Filled 2011-10-03: qty 0.5

## 2011-10-03 MED ORDER — FUROSEMIDE 10 MG/ML IJ SOLN
40.0000 mg | Freq: Once | INTRAMUSCULAR | Status: AC
  Administered 2011-10-03: 40 mg via INTRAVENOUS
  Filled 2011-10-03: qty 4

## 2011-10-03 MED ORDER — FUROSEMIDE 10 MG/ML IJ SOLN
40.0000 mg | Freq: Once | INTRAMUSCULAR | Status: AC
  Administered 2011-10-03: 40 mg via INTRAVENOUS

## 2011-10-03 MED ORDER — MORPHINE SULFATE 2 MG/ML IJ SOLN
INTRAMUSCULAR | Status: AC
  Administered 2011-10-03: 2 mg via INTRAVENOUS
  Filled 2011-10-03: qty 1

## 2011-10-03 NOTE — Progress Notes (Signed)
Patient awake from his sleep with shortness of breath, no chest pain. His O2 saturation was 85%, BP 221/142, and was diaphoretic. 2L of O2 intra nasal given, and O2 was 91%. On call MD. Dr. Arlyn Leak was notified, and received a verbal order to give him 40 mg of Furosamide, and to do EKG, and portable chest x-ray. 40 mg of Furosamide and morphine 2 mg iv given, EKG and portable chest x-ray was done. Dr. Arlyn Leak came to see the patient, and the patient was transferred to unit 2900.

## 2011-10-03 NOTE — Progress Notes (Signed)
Subjective:  Called to see patient by the nurse who noted the onset of acute respiratory distress this morning. He developed acute severe dyspnea while on unit 2000 and was given intravenous Lasix. An EKG showed significant tachycardia with marked voltage consistent with LVH and T-wave changes. He was in marked respiratory distress and chest x-ray showed acute pulmonary edema. He was transferred to the intensive care unit and was given morphine 4 mg and was placed on BiPAP. He had catheterization a year ago showing nonobstructive coronary artery disease with 30% stenosis in all 3 vessels. He also has a previous history of hypertensive crisis.  Objective:  Vital Signs in the last 24 hours: BP 198/122  Pulse 54  Temp 97.6 F (36.4 C) (Axillary)  Resp 23  Ht 6\' 3"  (1.905 m)  Wt 77.111 kg (170 lb)  BMI 21.25 kg/m2  SpO2 78%  Physical Exam: Elderly thin black male sitting bolt upright in bed in marked respiratory distress Lungs: Increased AP diameter, decreased breath sounds, poor air movement Cardiac:  Rapid regular rhythm, heart sounds difficult to hear  Extremities:  No edema present  Intake/Output from previous day: 07/14 0701 - 07/15 0700 In: 1364.5 [P.O.:1080; I.V.:284.5] Out: 1300 [Urine:1300]  Weight Filed Weights   10/02/11 0218 10/02/11 1420 10/03/11 0411  Weight: 76.295 kg (168 lb 3.2 oz) 76.2 kg (167 lb 15.9 oz) 77.111 kg (170 lb)    Lab Results: Basic Metabolic Panel:  Basename 10/01/11 2230  NA 141  K 3.4*  CL 105  CO2 26  GLUCOSE 117*  BUN 13  CREATININE 1.86*   CBC:  Basename 10/01/11 2230  WBC 7.2  NEUTROABS --  HGB 14.5  HCT 41.2  MCV 94.7  PLT 150   Cardiac Enzymes:  Basename 10/02/11 1530 10/02/11 0804 10/02/11 0430  CKTOTAL 300* 332* 300*  CKMB 17.5* 23.7* 21.0*  CKMBINDEX -- -- --  TROPONINI 10.54* 11.41* 7.86*    Telemetry: Sinus tachycardia  EKG: Voltage for LVH with ST and T-wave changes, possible old anteroseptal infarction, left  atrial enlargement, sinus tachycardia  Chest x-ray: Acute pulmonary edema  Assessment/Plan:  1. Acute respiratory distress with pulmonary edema 2. Malignant hypertension 3. History of nonischemic cardiomyopathy  Recommendations:  Moved to intensive care unit, intravenous Lasix, placed on BiPAP, intravenous morphine, intravenous nitroglycerin.  Critical care time: 45 minutes  W. Ashley Royalty  MD [facility] Cardiology  10/03/2011, 6:17 AM

## 2011-10-03 NOTE — Progress Notes (Signed)
    Subjective:  Feeling a little better on BiPap, but still short of breath. No chest pain.   Objective:  Vital Signs in the last 24 hours: Temp:  [97.6 F (36.4 C)-99.5 F (37.5 C)] 97.6 F (36.4 C) (07/15 0600) Pulse Rate:  [54-123] 54  (07/15 0600) Resp:  [18-39] 23  (07/15 0600) BP: (140-198)/(75-122) 198/122 mmHg (07/15 0558) SpO2:  [78 %-100 %] 78 % (07/15 0600) Weight:  [76.2 kg (167 lb 15.9 oz)-77.111 kg (170 lb)] 77.111 kg (170 lb) (07/15 0411)  Intake/Output from previous day: 07/14 0701 - 07/15 0700 In: 1364.5 [P.O.:1080; I.V.:284.5] Out: 1300 [Urine:1300]  Physical Exam: Pt is somnolent but easily arousable. He is on BiPap in no obvious respiratory distress. HEENT: normal Neck: JVP - moderately elevated Lungs: CTA bilaterally CV: RRR with S4 gallop Abd: soft, NT, Positive BS, no hepatomegaly Ext: no C/C/E, distal pulses intact and equal Skin: warm/dry no rash   Lab Results:  Basename 10/01/11 2230  WBC 7.2  HGB 14.5  PLT 150    Basename 10/01/11 2230  NA 141  K 3.4*  CL 105  CO2 26  GLUCOSE 117*  BUN 13  CREATININE 1.86*    Basename 10/02/11 1530 10/02/11 0804  TROPONINI 10.54* 11.41*   Tele: sinus rhythm/sinus tachycardia  CXR: IMPRESSION:  Cardiac enlargement with interval development of perihilar and  interstitial edema.  ECHO: Study Conclusions  - Left ventricle: The cavity size was mildly dilated. Wall thickness was normal. Systolic function was moderately reduced. The estimated ejection fraction was in the range of 35% to 40%. Severe hypokinesis of the mid-distalanteroseptal myocardium.  Assessment/Plan:  1. Acute respiratory failure, likely secondary to acute pulmonary edema. Discussed case with Dr Donnie Aho who saw the patient early this am. He has just received furosemide 40 mg IV and I am going to give him another 40 mg IV now for a total dose of 80 mg. Will start on IV NTG so that BP can be aggressively treated as he has become  markedly hypertensive. Need to consider change from Brilinta to plavix or effient as this drug can cause acute dyspnea. However, I suspect his symptoms are related to pulmonary edema as above. Pt with new LV dysfunction by echo, LVEF now 35-40%. Suspect combination of acute MI and severe hypertensive heart disease have caused acute pulmonary edema.  2. NSTEMI - unusual that patient had cath just one year ago with mild CAD. He has had clear NSTEMI with significant cardiac enzyme elevation but no recurrent chest pain. Will need cath but need to stabilize respiratory status first. Continue IV heparin. Echo reviewed and shows new wall motion abnormality with significant LV dysfunction. Suspect LAD-territory infarct.  3. CKD - needs repeat BMET this am with cath pending either this afternoon or tomorrow morning.  Clinical notes, lab data, echo data, prior cath note all reviewed. Critical care time devoted to this patient = 45 minutes.  Tonny Bollman, M.D. 10/03/2011, 6:31 AM

## 2011-10-03 NOTE — Progress Notes (Signed)
ANTICOAGULATION CONSULT NOTE - Follow Up Consult  Pharmacy Consult for heparin Indication: NSTEMI  No Known Allergies  Patient Measurements: Height: 6\' 3"  (190.5 cm) Weight: 170 lb (77.111 kg) IBW/kg (Calculated) : 84.5   Vital Signs: Temp: 97.6 F (36.4 C) (07/15 0600) Temp src: Axillary (07/15 0600) BP: 151/95 mmHg (07/15 0700) Pulse Rate: 97  (07/15 0700)  Labs:  Basename 10/03/11 0703 10/02/11 1850 10/02/11 1530 10/02/11 1115 10/02/11 0804 10/02/11 0430 10/01/11 2235 10/01/11 2230  HGB -- -- -- -- -- -- -- 14.5  HCT -- -- -- -- -- -- -- 41.2  PLT -- -- -- -- -- -- -- 150  APTT -- -- -- -- -- 74* 31 --  LABPROT -- -- -- -- -- 15.1 13.2 --  INR -- -- -- -- -- 1.17 0.98 --  HEPARINUNFRC 0.31 0.12* -- 0.30 -- -- -- --  CREATININE -- -- -- -- -- -- -- 1.86*  CKTOTAL -- -- 300* -- 332* 300* -- --  CKMB -- -- 17.5* -- 23.7* 21.0* -- --  TROPONINI -- -- 10.54* -- 11.41* 7.86* -- --    Estimated Creatinine Clearance: 42 ml/min (by C-G formula based on Cr of 1.86).   Medications:  Scheduled:    . amLODipine  5 mg Oral Daily  . aspirin EC  81 mg Oral Daily  . atorvastatin  80 mg Oral q1800  . carvedilol  6.25 mg Oral BID WC  . colesevelam  1,250 mg Oral BID  . furosemide  40 mg Intravenous Once  . furosemide  40 mg Intravenous Once  . heparin  2,000 Units Intravenous Once  .  morphine injection  2 mg Intravenous Once  . morphine      . morphine      . potassium chloride  40 mEq Oral Once  . Ticagrelor  180 mg Oral Once  . Ticagrelor  90 mg Oral BID  . DISCONTD: aspirin  324 mg Oral Pre-Cath  . DISCONTD: aspirin EC  81 mg Oral Daily  . DISCONTD: atorvastatin  40 mg Oral q1800  . DISCONTD: atorvastatin  80 mg Oral NOW  . DISCONTD: atorvastatin  80 mg Oral Q0600  . DISCONTD: diazepam  5 mg Oral On Call  . DISCONTD: levalbuterol  1.25 mg Nebulization Once  . DISCONTD: lisinopril  20 mg Oral Daily  . DISCONTD: sodium chloride  3 mL Intravenous Q12H     Assessment: 66 y.o. male presented with chest pain/+troponin - NSTEMI. Heparin started at [facility] ED and current heparin level at goal (0.31).  Planning cath on 7/15 or 7/16.  Goal of Therapy:  Heparin level 0.3-0.7 units/ml Monitor platelets by anticoagulation protocol: Yes   Plan:  -No heparin changes needed -Will follow progress -Daily CBC, heparin levels  Harland German, Pharm D 10/03/2011 7:54 AM

## 2011-10-03 NOTE — Care Management Note (Signed)
    Page 1 of 1   10/03/2011     11:00:09 AM   CARE MANAGEMENT NOTE 10/03/2011  Patient:  [name], [name]   Account Number:  [id number]  Date Initiated:  10/03/2011  Documentation initiated by:  Junius Creamer  Subjective/Objective Assessment:   adm w mi     Action/Plan:   lives w wife, pcp dr Ardyth Gal   Anticipated DC Date:     Anticipated DC Plan:        DC Planning Services  CM consult      Choice offered to / List presented to:             Status of service:   Medicare Important Message given?   (If response is "NO", the following Medicare IM given date fields will be blank) Date Medicare IM given:   Date Additional Medicare IM given:    Discharge Disposition:    Per UR Regulation:  Reviewed for med. necessity/level of care/duration of stay  If discussed at Long Length of Stay Meetings, dates discussed:    Comments:  7/15 11:00am debbie Francely Craw rn,bsn [phone]

## 2011-10-04 ENCOUNTER — Encounter (HOSPITAL_COMMUNITY): Payer: Self-pay | Admitting: Cardiology

## 2011-10-04 ENCOUNTER — Encounter (HOSPITAL_COMMUNITY)
Admission: EM | Disposition: A | Discharge status: Home / Self Care | Payer: Self-pay | Source: Home / Self Care | Attending: Cardiology

## 2011-10-04 DIAGNOSIS — I214 Non-ST elevation (NSTEMI) myocardial infarction: Secondary | ICD-10-CM

## 2011-10-04 DIAGNOSIS — I2510 Atherosclerotic heart disease of native coronary artery without angina pectoris: Secondary | ICD-10-CM

## 2011-10-04 HISTORY — PX: LEFT HEART CATHETERIZATION WITH CORONARY ANGIOGRAM: SHX5451

## 2011-10-04 HISTORY — PX: CARDIAC CATHETERIZATION: SHX172

## 2011-10-04 HISTORY — PX: CORONARY STENT PLACEMENT: SHX1402

## 2011-10-04 LAB — CBC
Hemoglobin: 13.6 g/dL (ref 13.0–17.0)
MCV: 92.8 fL (ref 78.0–100.0)
Platelets: 149 10*3/uL — ABNORMAL LOW (ref 150–400)
RBC: 4.14 MIL/uL — ABNORMAL LOW (ref 4.22–5.81)
WBC: 8.3 10*3/uL (ref 4.0–10.5)

## 2011-10-04 LAB — BASIC METABOLIC PANEL
BUN: 20 mg/dL (ref 6–23)
Calcium: 9.2 mg/dL (ref 8.4–10.5)
Chloride: 100 mEq/L (ref 96–112)
Creatinine, Ser: 1.87 mg/dL — ABNORMAL HIGH (ref 0.50–1.35)
GFR calc Af Amer: 41 mL/min — ABNORMAL LOW (ref >90)
GFR calc non Af Amer: 36 mL/min — ABNORMAL LOW (ref >90)

## 2011-10-04 LAB — POCT ACTIVATED CLOTTING TIME
Activated Clotting Time: 139 seconds
Activated Clotting Time: 279 seconds

## 2011-10-04 SURGERY — LEFT HEART CATHETERIZATION WITH CORONARY ANGIOGRAM
Anesthesia: LOCAL

## 2011-10-04 MED ORDER — ONDANSETRON HCL 4 MG/2ML IJ SOLN: 4.0000 mg | Freq: Four times a day (QID) | INTRAMUSCULAR | Status: DC | PRN

## 2011-10-04 MED ORDER — BIVALIRUDIN 250 MG IV SOLR
INTRAVENOUS | Status: AC
  Filled 2011-10-04: qty 250

## 2011-10-04 MED ORDER — HYDRALAZINE HCL 20 MG/ML IJ SOLN
INTRAMUSCULAR | Status: AC
  Filled 2011-10-04: qty 1

## 2011-10-04 MED ORDER — HEPARIN (PORCINE) IN NACL 2-0.9 UNIT/ML-% IJ SOLN
INTRAMUSCULAR | Status: AC
  Filled 2011-10-04: qty 2000

## 2011-10-04 MED ORDER — SODIUM CHLORIDE 0.9 % IV SOLN
INTRAVENOUS | Status: AC
  Administered 2011-10-04: 125 mL/h via INTRAVENOUS

## 2011-10-04 MED ORDER — LIDOCAINE HCL (PF) 1 % IJ SOLN
INTRAMUSCULAR | Status: AC
  Filled 2011-10-04: qty 30

## 2011-10-04 MED ORDER — NITROGLYCERIN 0.2 MG/ML ON CALL CATH LAB
INTRAVENOUS | Status: AC
  Filled 2011-10-04: qty 1

## 2011-10-04 MED ORDER — HYDRALAZINE HCL 20 MG/ML IJ SOLN
10.0000 mg | Freq: Once | INTRAMUSCULAR | Status: AC
  Administered 2011-10-04: 10 mg via INTRAVENOUS
  Filled 2011-10-04: qty 1

## 2011-10-04 NOTE — Interval H&P Note (Signed)
History and Physical Interval Note:  10/04/2011 9:18 AM  [name]  has presented today for surgery, with the diagnosis of cp  The various methods of treatment have been discussed with the patient and family. After consideration of risks, benefits and other options for treatment, the patient has consented to  Procedure(s) (LRB): LEFT HEART CATHETERIZATION WITH CORONARY ANGIOGRAM (N/A) as a surgical intervention .  The patient's history has been reviewed, patient examined, no change in status, stable for surgery.  I have reviewed the patients' chart and labs.  Questions were answered to the patient's satisfaction.     Shawnie Pons

## 2011-10-04 NOTE — Progress Notes (Signed)
Patient Name: [name] Date of Encounter: 10/04/2011  Principal Problem:  *Acute respiratory disease Active Problems:  Hyperlipidemia  Cardiomyopathy  Non-STEMI (non-ST elevated myocardial infarction)  Chronic kidney disease (CKD), stage IV (severe)  Hypokalemia  Pulmonary edema with congestive heart failure with reduced left ventricular function   SUBJECTIVE: Chest pain has resolved, SOB better per wife (pt denies ever being very SOB)  OBJECTIVE Filed Vitals:   10/04/11 0400 10/04/11 0500 10/04/11 0600 10/04/11 0700  BP: 138/77 133/82 135/100   Pulse:  68 62 64  Temp:      TempSrc:      Resp: 18 15 15 17   Height:      Weight:  161 lb (73.029 kg)    SpO2: 100% 100% 100% 100%    Intake/Output Summary (Last 24 hours) at 10/04/11 0738 Last data filed at 10/04/11 0700  Gross per 24 hour  Intake   1110 ml  Output   3775 ml  Net  -2665 ml   Weight change: -6 lb 15.9 oz (-3.171 kg) Filed Weights   10/02/11 1420 10/03/11 0411 10/04/11 0500  Weight: 167 lb 15.9 oz (76.2 kg) 170 lb (77.111 kg) 161 lb (73.029 kg)     PHYSICAL EXAM General: Well developed, well nourished, male in no acute distress. Head: Normocephalic, atraumatic.  Neck: Supple without bruits, JVD at about 10 cm. Lungs:  Resp regular and unlabored, bibasilar rales with decreased breath sounds. Heart: RRR, S1, S2, no S3, S4, or murmur. Abdomen: Soft, non-tender, non-distended, BS + x 4.  Extremities: No clubbing, cyanosis, no edema.  Neuro: Alert and oriented X 3. Moves all extremities spontaneously. Psych: Normal affect.  LABS: CBC: Basename 10/04/11 0446 10/03/11 0703  WBC 8.3 14.0*  NEUTROABS -- --  HGB 13.6 14.2  HCT 38.4* 40.3  MCV 92.8 93.9  PLT 149* 155   INR: Basename 10/02/11 0430  INR 1.17   Basic Metabolic Panel: Basename 10/04/11 0446 10/03/11 0703 10/02/11 0430  NA 136 137 --  K 3.7 5.0 --  CL 100 102 --  CO2 23 24 --  GLUCOSE 102* 131* --  BUN 20 14 --    CREATININE 1.87* 1.70* --  CALCIUM 9.2 9.7 --  MG -- -- 1.8  PHOS -- -- --   Cardiac Enzymes: Basename 10/03/11 1211 10/03/11 0703 10/02/11 1530  CKTOTAL 176 217 300*  CKMB 7.2* 8.0* 17.5*  CKMBINDEX -- -- --  TROPONINI 4.01* 4.83* 10.54*    Basename 10/01/11 2249  TROPIPOC 1.55*   BNP: Pro B Natriuretic peptide (BNP)  Date/Time Value Range Status  10/03/2011  7:03 AM 15977.0* 0 - 125 pg/mL Final  10/02/2011  4:30 AM 15882.0* 0 - 125 pg/mL Final   Hemoglobin A1C: Basename 10/02/11 0430  HGBA1C 5.4   Fasting Lipid Panel: Basename 10/02/11 0430  CHOL 201*  HDL 39*  LDLCALC 149*  TRIG 66  CHOLHDL 5.2  LDLDIRECT --   Thyroid Function Tests: Basename 10/02/11 0430  TSH 0.625  T4TOTAL --  T3FREE --  THYROIDAB --    TELE:  SR  ECHO: 10/02/2011  Study Conclusions - Left ventricle: The cavity size was mildly dilated. Wall thickness was normal. Systolic function was moderately reduced. The estimated ejection fraction was in the range of 35% to 40%. Severe hypokinesis of the mid-distalanteroseptal myocardium. - Aortic valve: Moderate regurgitation.  Radiology/Studies Dg Chest Port 1 View 10/03/2011  *RADIOLOGY REPORT*  Clinical Data: Shortness of breath and respiratory distress.  PORTABLE CHEST -  1 VIEW  Comparison: 10/02/2011  Findings: Cardiac enlargement with pulmonary vascular congestion. Interval development of perihilar and interstitial infiltrates consistent with edema.  No focal consolidation.  No blunting of costophrenic angles.  No pneumothorax.  IMPRESSION: Cardiac enlargement with interval development of perihilar and interstitial edema.  Original Report Authenticated By: Marlon Pel, M.D.    Current Medications:   . amLODipine  5 mg Oral Daily  . aspirin EC  81 mg Oral Daily  . atorvastatin  80 mg Oral q1800  . carvedilol  6.25 mg Oral BID WC  . colesevelam  1,250 mg Oral BID  . Ticagrelor  90 mg Oral BID  . DISCONTD:  morphine injection  2 mg  Intravenous Once      . sodium chloride    . heparin 1,450 Units/hr (10/04/11 0700)  . nitroGLYCERIN 10 mcg/min (10/04/11 0700)    ASSESSMENT AND PLAN: Principal Problem:  *Acute respiratory disease/Pulmonary edema with congestive heart failure with reduced left ventricular function -   pulm edema yest on CXR, improved after diuresis. Not on daily diuretic, may need one after cath, watch volume status carefully.  Active Problems:  Cardiomyopathy - prob ischemic, no ACE secondary to renal function, on BB.   Non-STEMI (non-ST elevated myocardial infarction) - cath today, cors only.   Chronic kidney disease (CKD), stage IV (severe) - follow   Hypokalemia - resolved   Hyperlipidemia - statin is not new, ?compliance.   Signed, Theodore Demark , PA-C 7:38 AM 10/04/2011  Patient seen, evaluated and case discussed with Dr. Excell Seltzer.  The ECG, echo all suggest LAD disease.  We are concerned about his renal function, but think cath with limited contrast still is the best course of action given the findings.  I have examined him and he is comfortable today.  We are attempting to keep BP under control.  I have explained to the patient and his wife all of the details of cath, and highlighted the potential risk of contrast induced nephropathy.  We plan to proceed with very limited contrast.  While his Cr is up slightly, he received furosemide and it is about his baseline.  IVF are problematic given his APE yesterday.  Will try to restrict to less than 50CC if possible.    Patient is in agreement.

## 2011-10-04 NOTE — Progress Notes (Signed)
Order for sheath removal verified per post procedural orders. Procedure explained to patient and Rt femoral artery access site assessed: level 0, palpable dorsalis pedis and posterior tibial pulses. 6French Sheath removed and manual pressure applied for 20 minutes. Pre, peri, & post procedural vitals: HR 69, RR 15, O2 Sat upper 90s, BP 150s/60s, Pain 0. Distal pulses remained intact after sheath removal. Access site level 0 and dressed with 4X4 gauze and tegaderm.  Aggie Cosier, RN confirmed condition of site. Post procedural instructions discussed with return demonstration from patient.

## 2011-10-04 NOTE — H&P (View-Only) (Signed)
 Patient Name: Royden A Ewen Date of Encounter: 10/04/2011  Principal Problem:  *Acute respiratory disease Active Problems:  Hyperlipidemia  Cardiomyopathy  Non-STEMI (non-ST elevated myocardial infarction)  Chronic kidney disease (CKD), stage IV (severe)  Hypokalemia  Pulmonary edema with congestive heart failure with reduced left ventricular function   SUBJECTIVE: Chest pain has resolved, SOB better per wife (pt denies ever being very SOB)  OBJECTIVE Filed Vitals:   10/04/11 0400 10/04/11 0500 10/04/11 0600 10/04/11 0700  BP: 138/77 133/82 135/100   Pulse:  68 62 64  Temp:      TempSrc:      Resp: 18 15 15 17  Height:      Weight:  161 lb (73.029 kg)    SpO2: 100% 100% 100% 100%    Intake/Output Summary (Last 24 hours) at 10/04/11 0738 Last data filed at 10/04/11 0700  Gross per 24 hour  Intake   1110 ml  Output   3775 ml  Net  -2665 ml   Weight change: -6 lb 15.9 oz (-3.171 kg) Filed Weights   10/02/11 1420 10/03/11 0411 10/04/11 0500  Weight: 167 lb 15.9 oz (76.2 kg) 170 lb (77.111 kg) 161 lb (73.029 kg)     PHYSICAL EXAM General: Well developed, well nourished, male in no acute distress. Head: Normocephalic, atraumatic.  Neck: Supple without bruits, JVD at about 10 cm. Lungs:  Resp regular and unlabored, bibasilar rales with decreased breath sounds. Heart: RRR, S1, S2, no S3, S4, or murmur. Abdomen: Soft, non-tender, non-distended, BS + x 4.  Extremities: No clubbing, cyanosis, no edema.  Neuro: Alert and oriented X 3. Moves all extremities spontaneously. Psych: Normal affect.  LABS: CBC: Basename 10/04/11 0446 10/03/11 0703  WBC 8.3 14.0*  NEUTROABS -- --  HGB 13.6 14.2  HCT 38.4* 40.3  MCV 92.8 93.9  PLT 149* 155   INR: Basename 10/02/11 0430  INR 1.17   Basic Metabolic Panel: Basename 10/04/11 0446 10/03/11 0703 10/02/11 0430  NA 136 137 --  K 3.7 5.0 --  CL 100 102 --  CO2 23 24 --  GLUCOSE 102* 131* --  BUN 20 14 --    CREATININE 1.87* 1.70* --  CALCIUM 9.2 9.7 --  MG -- -- 1.8  PHOS -- -- --   Cardiac Enzymes: Basename 10/03/11 1211 10/03/11 0703 10/02/11 1530  CKTOTAL 176 217 300*  CKMB 7.2* 8.0* 17.5*  CKMBINDEX -- -- --  TROPONINI 4.01* 4.83* 10.54*    Basename 10/01/11 2249  TROPIPOC 1.55*   BNP: Pro B Natriuretic peptide (BNP)  Date/Time Value Range Status  10/03/2011  7:03 AM 15977.0* 0 - 125 pg/mL Final  10/02/2011  4:30 AM 15882.0* 0 - 125 pg/mL Final   Hemoglobin A1C: Basename 10/02/11 0430  HGBA1C 5.4   Fasting Lipid Panel: Basename 10/02/11 0430  CHOL 201*  HDL 39*  LDLCALC 149*  TRIG 66  CHOLHDL 5.2  LDLDIRECT --   Thyroid Function Tests: Basename 10/02/11 0430  TSH 0.625  T4TOTAL --  T3FREE --  THYROIDAB --    TELE:  SR  ECHO: 10/02/2011  Study Conclusions - Left ventricle: The cavity size was mildly dilated. Wall thickness was normal. Systolic function was moderately reduced. The estimated ejection fraction was in the range of 35% to 40%. Severe hypokinesis of the mid-distalanteroseptal myocardium. - Aortic valve: Moderate regurgitation.  Radiology/Studies Dg Chest Port 1 View 10/03/2011  *RADIOLOGY REPORT*  Clinical Data: Shortness of breath and respiratory distress.  PORTABLE CHEST -   1 VIEW  Comparison: 10/02/2011  Findings: Cardiac enlargement with pulmonary vascular congestion. Interval development of perihilar and interstitial infiltrates consistent with edema.  No focal consolidation.  No blunting of costophrenic angles.  No pneumothorax.  IMPRESSION: Cardiac enlargement with interval development of perihilar and interstitial edema.  Original Report Authenticated By: WILLIAM R. STEVENS, M.D.    Current Medications:   . amLODipine  5 mg Oral Daily  . aspirin EC  81 mg Oral Daily  . atorvastatin  80 mg Oral q1800  . carvedilol  6.25 mg Oral BID WC  . colesevelam  1,250 mg Oral BID  . Ticagrelor  90 mg Oral BID  . DISCONTD:  morphine injection  2 mg  Intravenous Once      . sodium chloride    . heparin 1,450 Units/hr (10/04/11 0700)  . nitroGLYCERIN 10 mcg/min (10/04/11 0700)    ASSESSMENT AND PLAN: Principal Problem:  *Acute respiratory disease/Pulmonary edema with congestive heart failure with reduced left ventricular function -   pulm edema yest on CXR, improved after diuresis. Not on daily diuretic, may need one after cath, watch volume status carefully.  Active Problems:  Cardiomyopathy - prob ischemic, no ACE secondary to renal function, on BB.   Non-STEMI (non-ST elevated myocardial infarction) - cath today, cors only.   Chronic kidney disease (CKD), stage IV (severe) - follow   Hypokalemia - resolved   Hyperlipidemia - statin is not new, ?compliance.   Signed, Rhonda Barrett , PA-C 7:38 AM 10/04/2011  Patient seen, evaluated and case discussed with Dr. Cooper.  The ECG, echo all suggest LAD disease.  We are concerned about his renal function, but think cath with limited contrast still is the best course of action given the findings.  I have examined him and he is comfortable today.  We are attempting to keep BP under control.  I have explained to the patient and his wife all of the details of cath, and highlighted the potential risk of contrast induced nephropathy.  We plan to proceed with very limited contrast.  While his Cr is up slightly, he received furosemide and it is about his baseline.  IVF are problematic given his APE yesterday.  Will try to restrict to less than 50CC if possible.    Patient is in agreement.     

## 2011-10-04 NOTE — CV Procedure (Signed)
Cardiac Catheterization Procedure Note  Name: [name] MRN: [id number] DOB: [birth year]  Procedure: Placement of catheters for angio without Left Heart Cath, Selective Coronary Angiography, ,  PTCA/Stent of LAD with BMS  (nonDES)  Indication: Non STEMI   Diagnostic Procedure Details: The right groin was prepped, draped, and anesthetized with 1% lidocaine. Using the modified Seldinger technique, a 5 French sheath was introduced into the right femoral artery. Standard Judkins catheters were used for selective coronary angiography and left ventriculography. Catheter exchanges were performed over a wire.  The diagnostic procedure was well-tolerated without immediate complications.  The procedure was performed through an aortic stent graft and contrast limited to less than 25cc.    PROCEDURAL FINDINGS Hemodynamics: AO 140/74 (99) LV not done  Coronary angiography: Coronary dominance: right  Left mainstem: No obstruction  Left anterior descending (LAD): 95% proximal lesion just after tiny diagonal and before septal.    There is 30% segmental plaque beyond this.  The distal LAD after the second diagonal has about 50% plaquing then a focal 70% lesion.  The distal vessel wraps the apex.  The second diagonal has an 80% focal short discrete lesion.   The diagonal is only modest in size.    Left circumflex (LCx):  Consists of three major OM branches.  The second branch has 30% segmental plaque proximally.  The AV circ has 30-405 mid plaque  (OM3).  No critical disease.   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  There is also a mid 50-60% lesion.  There is mild irregularity distally.  The PDA is moderate in size/    Left ventriculography:Not done  PCI Procedure Note:  Following the diagnostic procedure, the decision was made to proceed with PCI.  I talked with the patient, and then went and again discussed with the wife and daughter.  He had a  critical lesion in the proximal LAD, consistent with findings of the ECG and echo.    Given his renal dysfunction, we elected not to do the diagonal or distal LAD, neither of which appeared to be culprit vessels.  We also made every attempt to limit contrast use.  The patient's wife was adamant about use of a BMS, as she said he is unreliable with meds even when she lays them out.   The sheath was upsized to a 6 [place]. Weight-based bivalirudin was given for anticoagulation. Once a therapeutic ACT was achieved, a 6 [place] XB 3.0  guide catheter was inserted.  A traverse coronary guidewire was used to cross the lesion.  The lesion was predilated with a 2.61mm balloon.  The lesion was then stented with a 3.0 by 16 Veriflex nonDES stent.  The stent was postdilated with a 3.75  noncompliant balloon.  Following PCI, there was 0% residual stenosis and TIMI-3 flow. Final angiography confirmed an excellent result. Femoral sheath was sewn into place..  The patient tolerated the PCI procedure well. There were no immediate procedural complications.  The patient was transferred to the post catheterization recovery area for further monitoring.  PCI Data: Vessel - LAD/Segment - 12 Percent Stenosis (pre)  95 with ulcer TIMI-flow 3 Stent Veriflex 3.0 to 3.75 [place] post dil Percent Stenosis (post) 0% TIMI-flow (post) 3  Final Conclusions:   1.  Critical proximal LAD lesion proximally with large vessel distribution successfully stented with BMS.  See report.   2.  Residual LAD disease as noted  (Dr. Excell Seltzer and I reviewed and  agree conservative management for now of other disease) 3.  CKD, stage 3  Recommendations:  1.  Gentle hydration. 2.  DAPT for twelve months if possible. 3.  Aggressive BP and lipid management. 4.  DC smoking.    [name] 10/04/2011, 11:07 AM

## 2011-10-04 NOTE — Progress Notes (Signed)
ANTICOAGULATION CONSULT NOTE - Follow Up Consult  Pharmacy Consult for heparin Indication: NSTEMI  No Known Allergies  Patient Measurements: Height: 6\' 3"  (190.5 cm) Weight: 161 lb (73.029 kg) IBW/kg (Calculated) : 84.5   Vital Signs: Temp: 98.4 F (36.9 C) (07/16 0329) Temp src: Oral (07/16 0329) BP: 135/100 mmHg (07/16 0600) Pulse Rate: 64  (07/16 0700)  Labs:  Basename 10/04/11 0446 10/03/11 1211 10/03/11 0703 10/02/11 1850 10/02/11 1530 10/02/11 0430 10/01/11 2235 10/01/11 2230  HGB 13.6 -- 14.2 -- -- -- -- --  HCT 38.4* -- 40.3 -- -- -- -- 41.2  PLT 149* -- 155 -- -- -- -- 150  APTT -- -- -- -- -- 74* 31 --  LABPROT -- -- -- -- -- 15.1 13.2 --  INR -- -- -- -- -- 1.17 0.98 --  HEPARINUNFRC 0.30 -- 0.31 0.12* -- -- -- --  CREATININE 1.87* -- 1.70* -- -- -- -- 1.86*  CKTOTAL -- 176 217 -- 300* -- -- --  CKMB -- 7.2* 8.0* -- 17.5* -- -- --  TROPONINI -- 4.01* 4.83* -- 10.54* -- -- --    Estimated Creatinine Clearance: 39.6 ml/min (by C-G formula based on Cr of 1.87).   Medications:  Scheduled:     . amLODipine  5 mg Oral Daily  . aspirin EC  81 mg Oral Daily  . atorvastatin  80 mg Oral q1800  . carvedilol  6.25 mg Oral BID WC  . colesevelam  1,250 mg Oral BID  . Ticagrelor  90 mg Oral BID  . DISCONTD:  morphine injection  2 mg Intravenous Once    Assessment: 66 y.o. male presented with chest pain/+troponin - NSTEMI. Heparin started at [facility] ED and current heparin level at goal (0.30).  Patient noted for cath today.  Goal of Therapy:  Heparin level 0.3-0.7 units/ml Monitor platelets by anticoagulation protocol: Yes   Plan:  -No heparin changes needed -Will follow post cath -Daily CBC, heparin levels  Harland German, Pharm D 10/04/2011 8:23 AM

## 2011-10-05 DIAGNOSIS — I214 Non-ST elevation (NSTEMI) myocardial infarction: Secondary | ICD-10-CM

## 2011-10-05 LAB — CBC
Hemoglobin: 13.2 g/dL (ref 13.0–17.0)
MCHC: 34.8 g/dL (ref 30.0–36.0)
Platelets: 150 10*3/uL (ref 150–400)
RBC: 4.09 MIL/uL — ABNORMAL LOW (ref 4.22–5.81)

## 2011-10-05 LAB — BASIC METABOLIC PANEL
CO2: 22 mEq/L (ref 19–32)
GFR calc non Af Amer: 35 mL/min — ABNORMAL LOW (ref >90)
Glucose, Bld: 96 mg/dL (ref 70–99)
Potassium: 3.4 mEq/L — ABNORMAL LOW (ref 3.5–5.1)
Sodium: 137 mEq/L (ref 135–145)

## 2011-10-05 MED ORDER — POTASSIUM CHLORIDE CRYS ER 20 MEQ PO TBCR
40.0000 meq | EXTENDED_RELEASE_TABLET | Freq: Once | ORAL | Status: AC
  Administered 2011-10-05: 40 meq via ORAL
  Filled 2011-10-05: qty 2

## 2011-10-05 MED ORDER — SODIUM BICARBONATE 8.4 % IV SOLN
INTRAVENOUS | Status: AC
  Filled 2011-10-05: qty 50

## 2011-10-05 MED FILL — Dextrose Inj 5%: INTRAVENOUS | Qty: 50 | Status: AC

## 2011-10-05 NOTE — Progress Notes (Signed)
Subjective:  Feels a little woozy, but has felt that way for a year and a half, per patient.  No chest pain or shortness of breath.    Objective:  Vital Signs in the last 24 hours: Temp:  [98 F (36.7 C)-100.8 F (38.2 C)] 99.2 F (37.3 C) (07/17 0800) Pulse Rate:  [34-94] 71  (07/17 0800) Resp:  [15-23] 18  (07/17 0800) BP: (117-165)/(62-86) 147/83 mmHg (07/17 0800) SpO2:  [96 %-100 %] 98 % (07/17 0800) Weight:  [163 lb 9.3 oz (74.2 kg)] 163 lb 9.3 oz (74.2 kg) (07/17 0500)  Intake/Output from previous day: 07/16 0701 - 07/17 0700 In: 1425.5 [P.O.:750; I.V.:675.5] Out: 1325 [Urine:1325]   Physical Exam: General: Well developed, well nourished, in no acute distress. Head:  Normocephalic and atraumatic. Lungs: Clear to auscultation and percussion.  Mild ronchii Heart: Normal S1 and S2.  No murmur, rubs or gallops.  Pulses: Pulses normal in all 4 extremities.  Cath site looks good.   Extremities: No clubbing or cyanosis. No edema. Neurologic: Alert and oriented x 3.    Lab Results:  Basename 10/05/11 0525 10/04/11 0446  WBC 7.4 8.3  HGB 13.2 13.6  PLT 150 149*    Basename 10/05/11 0525 10/04/11 0446  NA 137 136  K 3.4* 3.7  CL 103 100  CO2 22 23  GLUCOSE 96 102*  BUN 23 20  CREATININE 1.89* 1.87*    Basename 10/03/11 1211 10/03/11 0703  TROPONINI 4.01* 4.83*   Hepatic Function Panel No results found for this basename: PROT,ALBUMIN,AST,ALT,ALKPHOS,BILITOT,BILIDIR,IBILI in the last 72 hours No results found for this basename: CHOL in the last 72 hours No results found for this basename: PROTIME in the last 72 hours  Imaging: No results found.  EKG:  LVH with repolarization abnormalities  Cardiac Studies:  Cr stable.   Assessment/Plan:   Non-STEMI (non-ST elevated myocardial infarction) (10/02/2011)   Assessment: no chest pain and ECG is improved   Plan: continue to monitor Chronic kidney disease (CKD), stage IV (severe) (10/02/2011)   Assessment: Cr  stable after cath   Plan: monitor  Hypokalemia (10/02/2011)   Assessment: noted   Plan: replace Pulmonary edema with congestive heart failure with reduced left ventricular function (10/03/2011)   Assessment: clear lungs   Plan: monitor      Shawnie Pons, MD, [facility], FSCAI 10/05/2011, 9:17 AM

## 2011-10-05 NOTE — Progress Notes (Signed)
CARDIAC REHAB PHASE I   PRE:  Rate/Rhythm: 71SR  BP:  Supine: 154/75  Sitting:   Standing:    SaO2: 97% RA  MODE:  Ambulation: 250 ft   POST:  Rate/Rhythem: 87SR  BP:  Supine:   Sitting: 162/82  Standing:    SaO2: 98%RA 1005-1037 Pt walked 250 ft on RA with rolling walker and asst x 1. Gait fairly steady. Pt states first time he has walked. No complaints except pt states having a hard time adjusting to not being able to do everything he could before. Emotional support and encouragement given for the walk. Gave wife stent booklet. To recliner after walk. Call bell in reach.  Duanne Limerick

## 2011-10-06 LAB — BASIC METABOLIC PANEL
CO2: 19 mEq/L (ref 19–32)
Calcium: 9.4 mg/dL (ref 8.4–10.5)
Chloride: 103 mEq/L (ref 96–112)
Glucose, Bld: 163 mg/dL — ABNORMAL HIGH (ref 70–99)
Sodium: 136 mEq/L (ref 135–145)

## 2011-10-06 LAB — CBC
Hemoglobin: 13.6 g/dL (ref 13.0–17.0)
MCH: 32.9 pg (ref 26.0–34.0)
Platelets: 168 10*3/uL (ref 150–400)
RBC: 4.13 MIL/uL — ABNORMAL LOW (ref 4.22–5.81)
WBC: 6.9 10*3/uL (ref 4.0–10.5)

## 2011-10-06 NOTE — Progress Notes (Signed)
CBG 163 per AM labs. PA notified and no new orders received.

## 2011-10-06 NOTE — Progress Notes (Signed)
Cardiology Progress Note Patient Name: [name] Date of Encounter: 10/06/2011, 10:13 AM     Subjective  Ambulated with cardiac rehab yesterday without chest pain or sob. Doing well this morning without complaints.   Objective   Telemetry: NSR 60-80s  Medications: . amLODipine  5 mg Oral Daily  . aspirin EC  81 mg Oral Daily  . atorvastatin  80 mg Oral q1800  . carvedilol  6.25 mg Oral BID WC  . colesevelam  1,250 mg Oral BID  . potassium chloride  40 mEq Oral Once  . sodium bicarbonate      . Ticagrelor  90 mg Oral BID    Physical Exam: Temp:  [98.1 F (36.7 C)-98.7 F (37.1 C)] 98.1 F (36.7 C) (07/18 0400) Pulse Rate:  [32-80] 62  (07/18 0600) Resp:  [18-25] 19  (07/18 0800) BP: (119-169)/(59-99) 153/86 mmHg (07/18 0827) SpO2:  [96 %-100 %] 99 % (07/18 0800) Weight:  [165 lb 12.6 oz (75.2 kg)] 165 lb 12.6 oz (75.2 kg) (07/18 0800)  General: Elderly black male, in no acute distress. Head: Normocephalic, atraumatic, sclera non-icteric, nares are without discharge.  Neck: Supple. Negative for carotid bruits or JVD Lungs: Clear bilaterally to auscultation without wheezes, rales, or rhonchi. Breathing is unlabored. Heart: RRR S1 S2 without murmurs, rubs, or gallops.  Abdomen: Soft, non-tender, non-distended with normoactive bowel sounds. No rebound/guarding. No obvious abdominal masses. Msk:  Strength and tone appear normal for age. Extremities: No edema. Dry flaky skin to BLE. No clubbing or cyanosis. Distal pedal pulses are intact and equal bilaterally. Neuro: Alert and oriented X 3. Moves all extremities spontaneously. Psych:  Responds to questions appropriately with a flat affect.   Intake/Output Summary (Last 24 hours) at 10/06/11 1013 Last data filed at 10/06/11 0800  Gross per 24 hour  Intake    720 ml  Output   1075 ml  Net   -355 ml    Labs:  [facility] 10/06/11 0836 10/05/11 0525  NA 136 137  K 3.7 3.4*  CL 103 103  CO2 19 22  GLUCOSE  163* 96  BUN 20 23  CREATININE 1.76* 1.89*  CALCIUM 9.4 9.2  MG -- --  PHOS -- --   Basename 10/06/11 0500 10/05/11 0525  WBC 6.9 7.4  HGB 13.6 13.2  HCT 38.2* 37.9*  MCV 92.5 92.7  PLT 168 150     10/02/2011 04:30 10/02/2011 08:04 10/02/2011 15:30 10/03/2011 07:03 10/03/2011 12:11  CK, MB 21.0 (HH) 23.7 (HH) 17.5 (HH) 8.0 (HH) 7.2 (HH)  CK Total 300 (H) 332 (H) 300 (H) 217 176  Troponin I 7.86 (HH) 11.41 (HH) 10.54 (HH) 4.83 (HH) 4.01 (HH)     10/02/2011 04:30 10/03/2011 07:03  Pro B Natriuretic peptide (BNP) 15882.0 (H) 15977.0 (H)     10/02/2011 04:30  Cholesterol 201 (H)  Triglycerides 66  HDL 39 (L)  LDL (calc) 149 (H)  VLDL 13  Total CHOL/HDL Ratio 5.2     10/02/2011 04:30  Hemoglobin A1C 5.4     10/02/2011 04:30  TSH 0.625    Radiology/Studies:   10/04/11 - Cardiac Cath Hemodynamics:  AO 140/74 (99)  LV not done  Coronary angiography:  Coronary dominance: right  Left mainstem: No obstruction  Left anterior descending (LAD): 95% proximal lesion just after tiny diagonal and before septal. There is 30% segmental plaque beyond this. The distal LAD after the second diagonal has about 50% plaquing then a focal 70%  lesion. The distal vessel wraps the apex. The second diagonal has an 80% focal short discrete lesion. The diagonal is only modest in size.  Left circumflex (LCx): Consists of three major OM branches. The second branch has 30% segmental plaque proximally. The AV circ has 30-405 mid plaque (OM3). No critical disease.  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 There is also a mid 50-60% lesion. There is mild irregularity distally. The PDA is moderate in size/  Left ventriculography:Not done  Final Conclusions:  1. Critical proximal LAD lesion proximally with large vessel distribution successfully stented with BMS. See report.  2. Residual LAD disease as noted (Dr. Excell Seltzer and I reviewed and agree conservative management for  now of other disease)  3. CKD, stage 3  Recommendations:  1. Gentle hydration.  2. DAPT for twelve months if possible.  3. Aggressive BP and lipid management.  4. DC smoking.    10/03/2011 -  Chest Port 1 View Findings: Cardiac enlargement with pulmonary vascular congestion. Interval development of perihilar and interstitial infiltrates consistent with edema.  No focal consolidation.  No blunting of costophrenic angles.  No pneumothorax.  IMPRESSION: Cardiac enlargement with interval development of perihilar and interstitial edema.   10/02/11 - 2D Echocardiogram Study Conclusions: - Left ventricle: The cavity size was mildly dilated. Wall thickness was normal. Systolic function was moderately reduced. The estimated ejection fraction was in the range of 35% to 40%. Severe hypokinesis of the mid-distalanteroseptal myocardium. - Aortic valve: Moderate regurgitation.    Assessment and Plan   1. NSTEMI: s/p BMS to prox LAD 10/04/11. Cont ASA, Brilinta, BB, statin  2. Acute on Chronic Systolic CHF w/ Pulmonary Edema: EF 35-40% by echo. I/Os net neg 3L. Lungs clear. Euvolemic on exam. Cont BB. No ACEI 2/2 renal insuffiency  3. CKD Stage IV: Crt stable after cath. 1.76 this am  4. Hypokalemia: K+ supplemented yesterday. K+ 3.7 this am. Cont scheduled oral supplementation   5. Hypertension: Stable. Cont antihypertensives  6. Hyperlipidemia: LDL 149. Cont statin.  7. Tobacco Abuse: Discussed smoking cessation. He is not sure he wants to quit.  8. Medical Noncompliance: Stressed importance of compliance with medication, tobacco cessation, and diet  Signed, HOPE, JESSICA PA-C  Patient seen and examined.  Doing well.  His wife remains concerned that he will not take his meds after dc from the hospital.  I went into detail with him the potential of what could happen.  Lungs are clear.  VSS are stable with good BP control.  Prob transfer today, with consideration of dc tomorrow.   Reviewed with  wife.

## 2011-10-06 NOTE — Progress Notes (Signed)
CARDIAC REHAB PHASE I   PRE:  Rate/Rhythm: 66SR  BP:  Supine: 148/89  Sitting:   Standing:    SaO2: 98%RA  MODE:  Ambulation: 350 ft   POST:  Rate/Rhythem: 83SR  BP:  Supine:   Sitting: 140/81  Standing:    SaO2: 98%RA 1125-1150 Pt fast asleep. Walked 350 ft on RA with rolling walker and asst x 1. Gait steady. Denied Cp or dizziness. To recliner with call bell. Stated he was not tired at end of walk but did not want to go farther. Tolerated well. Will need wife present when we do ed. Will continue to follow.  Duanne Limerick

## 2011-10-07 ENCOUNTER — Encounter (HOSPITAL_COMMUNITY): Payer: Self-pay

## 2011-10-07 DIAGNOSIS — I2510 Atherosclerotic heart disease of native coronary artery without angina pectoris: Secondary | ICD-10-CM

## 2011-10-07 DIAGNOSIS — I5022 Chronic systolic (congestive) heart failure: Secondary | ICD-10-CM

## 2011-10-07 LAB — BASIC METABOLIC PANEL
BUN: 18 mg/dL (ref 6–23)
CO2: 23 mEq/L (ref 19–32)
Calcium: 9.7 mg/dL (ref 8.4–10.5)
Chloride: 103 mEq/L (ref 96–112)
Creatinine, Ser: 1.73 mg/dL — ABNORMAL HIGH (ref 0.50–1.35)
Glucose, Bld: 88 mg/dL (ref 70–99)

## 2011-10-07 LAB — CBC
HCT: 40 % (ref 39.0–52.0)
Hemoglobin: 14 g/dL (ref 13.0–17.0)
MCH: 32.8 pg (ref 26.0–34.0)
MCV: 93.7 fL (ref 78.0–100.0)
RBC: 4.27 MIL/uL (ref 4.22–5.81)
WBC: 6.9 10*3/uL (ref 4.0–10.5)

## 2011-10-07 MED ORDER — COLESEVELAM HCL 625 MG PO TABS: 1250.0000 mg | ORAL_TABLET | Freq: Two times a day (BID) | ORAL | Status: DC

## 2011-10-07 MED ORDER — ASPIRIN 81 MG PO TBEC: 81.0000 mg | DELAYED_RELEASE_TABLET | Freq: Every day | ORAL | Status: AC

## 2011-10-07 MED ORDER — CARVEDILOL 6.25 MG PO TABS: 6.2500 mg | ORAL_TABLET | Freq: Two times a day (BID) | ORAL | Status: DC

## 2011-10-07 MED ORDER — AMLODIPINE BESYLATE 5 MG PO TABS: 5.0000 mg | ORAL_TABLET | Freq: Every day | ORAL | Status: DC

## 2011-10-07 MED ORDER — NITROGLYCERIN 0.4 MG SL SUBL: 0.4000 mg | SUBLINGUAL_TABLET | SUBLINGUAL | Status: DC | PRN

## 2011-10-07 MED ORDER — ATORVASTATIN CALCIUM 80 MG PO TABS: 80.0000 mg | ORAL_TABLET | Freq: Every day | ORAL | Status: DC

## 2011-10-07 MED ORDER — HEART ATTACK BOUNCING BOOK
Freq: Once | Status: AC
  Administered 2011-10-07: 09:00:00
  Filled 2011-10-07: qty 1

## 2011-10-07 MED ORDER — TICAGRELOR 90 MG PO TABS: 90.0000 mg | ORAL_TABLET | Freq: Two times a day (BID) | ORAL | Status: DC

## 2011-10-07 NOTE — Progress Notes (Signed)
[id number] Cardiac Rehab Completed MI, CHF and stent discharge education with pt and wife. Strongly encouraged smoking cessation and decreasing alcohol consumption. Pt given tips for smoking cessation and telephone numbers for free coaching for quitting. Pt agrees to Outpt. CRP in [place], will send referral. Strongly encouraged medication compliance and result of not taking them after getting stent. He voices understanding.

## 2011-10-07 NOTE — Discharge Summary (Signed)
Discharge Summary   Patient ID: [name],  MRN: [id number], DOB/AGE: [birth year] 67 y.o.  Admit date: 10/01/2011 Discharge date: 10/07/2011  Primary Physician: Greg Asa, MD Primary Cardiologist: Greg Bing, MD  Discharge Diagnoses Principal Problem:  *Acute respiratory disease Active Problems:  Hyperlipidemia  Tobacco abuse  Non-STEMI (non-ST elevated myocardial infarction)  Hypokalemia  Pulmonary edema with congestive heart failure with reduced left ventricular function  CAD (coronary artery disease)  Chronic systolic CHF (congestive heart failure)  CKD (chronic kidney disease) stage 3, GFR 30-59 ml/min  Allergies No Known Allergies  Diagnostic Studies/Procedures  PORTABLE CHEST X-RAY - 10/01/11  Comparison: 07/07/2010 and 04/29/2010.  Findings: Cardiomegaly. Tortuous aorta. Central pulmonary  vascular prominence.  Slight increased markings mid right lung may represent confluence  of shadows. The patient would eventually benefit from follow-up two-  view chest with cardiac leads removed.  Left base subsegmental atelectasis.  IMPRESSION:  Cardiomegaly.  Tortuous aorta.  Central pulmonary vascular prominence.  Slight increased markings mid right lung may represent confluence  of shadows. The patient would eventually benefit from follow-up two-  view chest with cardiac leads removed.  Left base subsegmental atelectasis.  PORTABLE CHEST X-RAY - 10/02/11  Comparison: 10/01/2011  Findings: Previously seen area of patchy opacity the right  perihilar region is no longer visualized. Both lungs are clear.  Pulmonary hyperinflation is again seen, consistent with COPD.  Cardiomegaly remains stable. No evidence of pulmonary edema. No  evidence of pleural effusion.  IMPRESSION:  Stable cardiomegaly and COPD. No active lung disease.   2D ECHOCARDIOGRAM - 10/02/11  Left ventricle: The cavity size was mildly dilated. Wall thickness was normal. Systolic  function was moderately reduced. The estimated ejection fraction was in the range of 35% to 40%. Severe hypokinesis of the mid-distalanteroseptal myocardium. - Aortic valve: Moderate regurgitation.  PORTABLE CHEST X-RAY - 10/03/11  Comparison: 10/02/2011  Findings: Cardiac enlargement with pulmonary vascular congestion.  Interval development of perihilar and interstitial infiltrates  consistent with edema. No focal consolidation. No blunting of  costophrenic angles. No pneumothorax.  IMPRESSION:  Cardiac enlargement with interval development of perihilar and  interstitial edema.   CARDIAC CATHETERIZATION + PCI - 10/04/11  Hemodynamics:  AO 140/74 (99)  LV not done  Coronary angiography:  Coronary dominance: right  Left mainstem: No obstruction  Left anterior descending (LAD): 95% proximal lesion just after tiny diagonal and before septal. There is 30% segmental plaque beyond this. The distal LAD after the second diagonal has about 50% plaquing then a focal 70% lesion. The distal vessel wraps the apex. The second diagonal has an 80% focal short discrete lesion. The diagonal is only modest in size.  Left circumflex (LCx): Consists of three major OM branches. The second branch has 30% segmental plaque proximally. The AV circ has 30-405 mid plaque (OM3). No critical disease.  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 There is also a mid 50-60% lesion. There is mild irregularity distally. The PDA is moderate in size/  Left ventriculography:Not done  PCI Data:  Vessel - LAD/Segment - 12  Percent Stenosis (pre) 95 with ulcer  TIMI-flow 3  Stent Veriflex 3.0 to 3.75 [place] post dil  Percent Stenosis (post) 0%  TIMI-flow (post) 3  Final Conclusions:  1. Critical proximal LAD lesion proximally with large vessel distribution successfully stented with BMS. See report.  2. Residual LAD disease as noted (Dr. Excell [name] and I reviewed and agree  conservative management for now  of other disease)  3. CKD, stage 3  History of Present Illness  [name] is a 66yo male with PMHx significant for the above problem list who was admitted to [facility] on 10/02/11 for NSTEMI.   He was originally admitted by the night fellow, Dr. Katha [name]. He had previously had an echocardiogram and cath in [facility], [place] in 08/2010 revealing LVEF 30% and nonobstructive CAD, respectively. He presented to [facility] with 6/10 sharp, chest pain beginning at rest, lasting for ~ 30 minutes before spontaneous relief. No other associated symptoms. He affirmed continued tobacco abuse and medication non-compliance. Upon ED arrival, EKG revealed sinus bradycardia, PACs, LVH and inferolateral TWIs. POC trop-I 1.55. He was hemodynamically stable. He was admitted by cardiology to telemetry and heparinized. Additionally, Cr was found to be elevated to 1.86 (baseline 1.7).  Hospital Course   He remained stable overnight. Cardiac biomarkers were cycled and troponin-I rose to > 11.00 and the plan was made for cardiac catheterization the following Monday if he remained stable and asymptomatic. The case was discussed with Dr. Excell [name] who recommended Brilinta loading with cath the following day. This was pursued, and 2D echo in the meantime revealed LVEF 35-40%, mild LV dilatation, moderate AI and severe hypokinesis of the mid-distalanteroseptal myocardium. Given the location/territory of the patient's wall motion abnormality, the lesion was believed to be in the LAD.   Overnight, the patient became acutely dyspneic, hypoxic and developed respiratory distress. Additionally, BP was markedly elevated (220/140). CXR as above revealed pulmonary edema. The patient was given Lasix IV, morphine and started on BiPAP with relief. EKG revealed no significant change since admission. He was subsequently transferred to ICU and was weaned off BiPAP. NTG IV was started and continued  for BP control. This was believed to have been in the setting of NSTEMI and hypertensive emergency.   The patient continued to improve from a respiratory standpoint as evidenced by a repeat CXR the following day. He was gently hydrated given the impending contrast-load associated with cath. He was informed, consented and prepped for cardiac catheterization which was accessed via the R groin. The full details are outlined above. This was notable for normal left main, 95% pLAD stenosis with ulceration s/p successful BMS placement, 30% segmental plaque beyond this, dLAD after D2 with 50% plaquing, then focal 70% lesion, 80% focal short D2 stenosis, 30% pOM2 lesion, 30-40% mOM3 stenosis, Shephard's crook region of RCA with 50% lesion, mRCA 50-60% lesion and mild dRCA irregularities. He tolerated the procedure well without complications. The recommendation was made for DAPT- [name]/Brilinta for at least 1 month, but preferably 1 year. Aggressive BP and lipid control, smoking cessation were stressed.   The patient was gently hydrated post-cath and serial Cr measurements indicated a return to renal function. He improved daily, and continued to diurese (net I/O - 3563 cc). Lasix was eventually held given the patient's baseline renal insufficiency. Low-dose BB was added. ACEi was held. The patient remained euvolemic and without evidence of pulmonary edema off diuretics. He returned to euvolemia, was assessed by Dr. Riley Kill this morning, and felt to be stable for discharge. Groin site was examined and felt to be healing well. The patient ambulated well with cardiac rehab who reinforced medication compliance, tobacco and alcohol cessation. The patient will be discharged home today with the medication regimen listed below. A lipid panel was ordered this admission revealing LDL 149. Statin was switched to Lipitor 80mg  PO daily. Affordability of Brilinta was discussed with  the patient who believed cost would not be an issue. He  will follow-up in the [facility] clinic as below. This information, including activity restrictions and supplemental ACS material, has been clearly outlined in the discharge AVS.   Discharge Vitals:  Blood pressure 123/73, pulse 69, temperature 97.9 F (36.6 C), temperature source Oral, resp. rate 18, height 6\' 3"  (1.905 m), weight 73.483 kg (162 lb), SpO2 97.00%.   Labs: Recent Labs  Basename 10/07/11 0641 10/06/11 0500   WBC 6.9 6.9   HGB 14.0 13.6   HCT 40.0 38.2*   MCV 93.7 92.5   PLT 171 168   Lab 10/07/11 0641 10/06/11 0836 10/05/11 0525  NA 137 136 137  K 4.3 3.7 3.4*  CL 103 103 103  CO2 23 19 22   BUN 18 20 23   CREATININE 1.73* 1.76* 1.89*  CALCIUM 9.7 9.4 9.2  PROT -- -- --  BILITOT -- -- --  ALKPHOS -- -- --  ALT -- -- --  AST -- -- --  AMYLASE -- -- --  LIPASE -- -- --  GLUCOSE 88 163* 96   Disposition:  Discharge Orders    Future Appointments: Provider: Department: Dept Phone: Center:   10/21/2011 1:00 PM Jodelle Gross, NP [facility] [phone] [facility]   04/09/2012 9:00 AM [facility] [phone] [facility]   04/09/2012 9:30 AM Nada Libman, MD [facility] [phone] [facility]     Future Orders Please Complete By Expires   Amb Referral to Cardiac Rehabilitation        Follow-up Information    Follow up with Joni Reining, NP on 10/21/2011. (At 1:00 PM for follow-up after this hospitalization. You will see Joni Reining for Dr. Dietrich Pates in [place]. )    Contact information:   [address] [place] [address]         Discharge Medications:  Medication List  As of 10/07/2011  2:35 PM   START taking these medications         aspirin 81 MG EC tablet   Take 1 tablet (81 mg total) by mouth daily.      atorvastatin 80 MG tablet   Commonly known as: LIPITOR   Take 1 tablet (80 mg total) by mouth daily at 6 PM.      nitroGLYCERIN 0.4 MG SL tablet   Commonly known as: NITROSTAT   Place 1  tablet (0.4 mg total) under the tongue every 5 (five) minutes x 3 doses as needed for chest pain.      Ticagrelor 90 MG Tabs tablet   Commonly known as: BRILINTA   Take 1 tablet (90 mg total) by mouth 2 (two) times daily.         CONTINUE taking these medications         amLODipine 5 MG tablet   Commonly known as: NORVASC   Take 1 tablet (5 mg total) by mouth daily.      carvedilol 6.25 MG tablet   Commonly known as: COREG   Take 1 tablet (6.25 mg total) by mouth 2 (two) times daily with a meal.      colesevelam 625 MG tablet   Commonly known as: WELCHOL   Take 2 tablets (1,250 mg total) by mouth 2 (two) times daily.      multivitamin with minerals Tabs         STOP taking these medications         dipyridamole-aspirin 200-25 MG per 12 hr capsule  furosemide 40 MG tablet      lisinopril 20 MG tablet          Where to get your medications    These are the prescriptions that you need to pick up. We sent them to a specific pharmacy, so you will need to go there to get them.   [id number] - [place], [place] - [address] AT [address]    [address] [place] [address]    Phone: [phone]        amLODipine 5 MG tablet   atorvastatin 80 MG tablet   carvedilol 6.25 MG tablet   colesevelam 625 MG tablet   nitroGLYCERIN 0.4 MG SL tablet   Ticagrelor 90 MG Tabs tablet         Information on where to get these meds is not yet available. Ask your nurse or doctor.         aspirin 81 MG EC tablet           Outstanding Labs/Studies: None   Duration of Discharge Encounter: Greater than 30 minutes including physician time.  Signed, R. Hurman Horn, PA-C 10/07/2011, 2:35 PM

## 2011-10-07 NOTE — Progress Notes (Signed)
Subjective:  Curled up in bed, but no definite complaints.  No chest pain.  Feels ok.  I had a long talk with him about his medications.  He often just decides to stop taking.    Objective:  Vital Signs in the last 24 hours: Temp:  [97.9 F (36.6 C)-98.4 F (36.9 C)] 97.9 F (36.6 C) (07/19 0500) Pulse Rate:  [45-75] 69  (07/19 0500) Resp:  [18-19] 18  (07/18 2014) BP: (123-170)/(72-90) 123/73 mmHg (07/19 0924) SpO2:  [97 %-100 %] 97 % (07/19 0500) Weight:  [160 lb (72.576 kg)-162 lb (73.483 kg)] 162 lb (73.483 kg) (07/19 0500)  Intake/Output from previous day: 07/18 0701 - 07/19 0700 In: 222 [P.O.:222] Out: 525 [Urine:525]   Physical Exam: General: Well developed, well nourished, in no acute distress. Head:  Normocephalic and atraumatic. Lungs: Clear to auscultation and percussion. Heart: Normal S1 and S2.  No murmur, rubs or gallops.  Pulses: Pulses normal in all 4 extremities. Extremities: No clubbing or cyanosis. No edema. Neurologic: Alert and oriented x 3.    Lab Results:  Basename 10/07/11 0641 10/06/11 0500  WBC 6.9 6.9  HGB 14.0 13.6  PLT 171 168    Basename 10/07/11 0641 10/06/11 0836  NA 137 136  K 4.3 3.7  CL 103 103  CO2 23 19  GLUCOSE 88 163*  BUN 18 20  CREATININE 1.73* 1.76*   No results found for this basename: TROPONINI:2,CK,MB:2 in the last 72 hours Hepatic Function Panel No results found for this basename: PROT,ALBUMIN,AST,ALT,ALKPHOS,BILITOT,BILIDIR,IBILI in the last 72 hours No results found for this basename: CHOL in the last 72 hours No results found for this basename: PROTIME in the last 72 hours  Imaging: No results found.    Assessment/Plan:  Patient Active Hospital Problem List: Acute respiratory disease (10/03/2011)   Assessment: much improved.  Occurred during episode of ischemia or elevated BP   Plan: stable  Cardiomyopathy ()   Assessment: SP PCI   Plan: hopefully will improve Non-STEMI (non-ST elevated myocardial  infarction) (10/02/2011)   Assessment: SP PCI of the LAD with BMS.     Plan: DAPT for one year, but at least one month.   Chronic kidney disease (CKD), stage IV (severe) (10/02/2011)   Assessment: stable Cr   Plan: follow as op  He appears stable and has been now for the past couple of days.  He has not had symptoms since PCI 72 hours ago.  Will likely let him go home today.  I stressed the importance of meds.  Patient understands needs.  Follow up with RR in [place].        Shawnie Pons, MD, [facility], FSCAI 10/07/2011, 1:46 PM

## 2011-10-07 NOTE — Progress Notes (Signed)
Patient evaluated for long-term disease management services with [facility] Care Management Program as a benefit of his [employer]. Patient will receive a post discharge transition of care call and if needed and patient consents, he will receive monthly home visits for assessments and for education.      Raiford Noble, MSN- Ed, RN,BSN [facility] Liaison [phone]

## 2011-10-07 NOTE — Progress Notes (Signed)
CARDIAC REHAB PHASE I   PRE:  Rate/Rhythm: 65 SR  BP:  Supine:   Sitting: 132/74  Standing:    SaO2: 99 RA  MODE:  Ambulation: 550 ft   POST:  Rate/Rhythem: 83 SR  BP:  Supine:   Sitting: 120/80  Standing:    SaO2: 99 RA 1130-1200 On arrival to pt's room he was in bed. States that "I have been lazy"Assisted X 1 and used walker to ambulate. Gait steady with walker. VS stable. Pt to recliner after walk with call light in reach. Will follow pt to do discharge education with pt.and wife. She is not here now.                                                                      Beatrix Fetters

## 2011-10-21 ENCOUNTER — Ambulatory Visit (INDEPENDENT_AMBULATORY_CARE_PROVIDER_SITE_OTHER): Payer: Medicare Other | Admitting: Adult Health

## 2011-10-21 ENCOUNTER — Encounter: Payer: Self-pay | Admitting: Adult Health

## 2011-10-21 VITALS — BP 150/80 | HR 64 | Resp 16 | Ht 75.0 in | Wt 164.2 lb

## 2011-10-21 DIAGNOSIS — I714 Abdominal aortic aneurysm, without rupture: Secondary | ICD-10-CM

## 2011-10-21 DIAGNOSIS — I2510 Atherosclerotic heart disease of native coronary artery without angina pectoris: Secondary | ICD-10-CM

## 2011-10-21 DIAGNOSIS — I119 Hypertensive heart disease without heart failure: Secondary | ICD-10-CM

## 2011-10-21 DIAGNOSIS — E7800 Pure hypercholesterolemia, unspecified: Secondary | ICD-10-CM

## 2011-10-21 DIAGNOSIS — I214 Non-ST elevation (NSTEMI) myocardial infarction: Secondary | ICD-10-CM

## 2011-10-21 MED ORDER — TICAGRELOR 90 MG PO TABS: 90.0000 mg | ORAL_TABLET | Freq: Two times a day (BID) | ORAL | Status: DC

## 2011-10-21 MED ORDER — AMLODIPINE BESYLATE 5 MG PO TABS: 5.0000 mg | ORAL_TABLET | Freq: Every day | ORAL | Status: DC

## 2011-10-21 MED ORDER — CARVEDILOL 6.25 MG PO TABS: 6.2500 mg | ORAL_TABLET | Freq: Two times a day (BID) | ORAL | Status: DC

## 2011-10-21 MED ORDER — COLESEVELAM HCL 625 MG PO TABS: 1250.0000 mg | ORAL_TABLET | Freq: Two times a day (BID) | ORAL | Status: DC

## 2011-10-21 MED ORDER — ATORVASTATIN CALCIUM 80 MG PO TABS: 80.0000 mg | ORAL_TABLET | Freq: Every day | ORAL | Status: DC

## 2011-10-21 NOTE — Assessment & Plan Note (Signed)
He has had a recent admission with PCI of the LAD using a bare-metal stent on 10/05/2011. He will continue on Alimta aspirin and current medication regimen. He was not found to be a candidate for ACE inhibitor or ARB in the setting of chronic kidney disease stage III. His GFR was 30-59 mL a minute her discharge summary report. I have referred him to cardiac rehabilitation for assistance in motivation of healthy lifestyle, exercise, and socialization which may help him with his apparent depression.

## 2011-10-21 NOTE — Assessment & Plan Note (Addendum)
Echocardiogram completed during recent hospitalization revealed an LVEF of 35-40%. He had mild LV dilatation with moderate AI and severe hypokinesis of the mid and distal anteroseptal myocardium. He will continue on current medication regimen. Is not showing any evidence of fluid overload or CHF at the time of this evaluation. Will followup in 3 months with an echocardiogram, for evaluation of LV function. May need to consider ICD pacemaker should his LVEF be consistently depressed. Blood pressure is not well controlled here on today's evaluation. We will need to titrate up his Coreg to 12.5 mg twice a day. Consider increasing amlodipine on next visit.

## 2011-10-21 NOTE — Patient Instructions (Addendum)
You have been referred to the Cardiac Rehab Program at [facility].  Your physician wants you to follow-up in: 6 months with Joni Reining, NP.  You will receive a reminder letter in the mail two months in advance. If you don't receive a letter, please call our office to schedule the follow-up appointment.

## 2011-10-21 NOTE — Progress Notes (Signed)
HPI: [name] is a 66 year old patient of Dr. Grant Bing who we are following for multiple cardiovascular issues. He is recently been admitted to [facility] on 10/01/2011 in the setting of acute respiratory distress and non-ST elevation MI. He had a cardiac catheterization completed revealing critical proximal LAD lesion with large vessel distribution. This was successfully stented with a bare-metal stent. He did have some residual LAD disease and the films were reviewed by 2 cardiologists. It was felt that medical management was appropriate.    Since discharge the patient has been essentially sedentary. His wife is frustrated that he is not get out and walk or participate in family activities. States that he gets up and basically sits around the house all day. When asked about this he states he has just not felt like it. I asked him if he was depressed and he gave a vague answer, and was not sure.    He denies any recurrent discomfort in his chest, dyspnea on exertion, or dizziness. He was noted to be very hypertensive during initial hospitalization, which was controlled with medication regimen listed below. He is now on dual antiplatelet therapy with aspirin and Brilinta. He denies any bleeding issues.   No Known Allergies  Current Outpatient Prescriptions  Medication Sig Dispense Refill  . amLODipine (NORVASC) 5 MG tablet Take 1 tablet (5 mg total) by mouth daily.  30 tablet  3  . aspirin EC 81 MG EC tablet Take 1 tablet (81 mg total) by mouth daily.      Marland Kitchen atorvastatin (LIPITOR) 80 MG tablet Take 1 tablet (80 mg total) by mouth daily at 6 PM.  30 tablet  3  . carvedilol (COREG) 6.25 MG tablet Take 1 tablet (6.25 mg total) by mouth 2 (two) times daily with a meal.  60 tablet  3  . colesevelam (WELCHOL) 625 MG tablet Take 2 tablets (1,250 mg total) by mouth 2 (two) times daily.  120 tablet  3  . Multiple Vitamin (MULTIVITAMIN WITH MINERALS) TABS Take 1 tablet by mouth daily.      .  nitroGLYCERIN (NITROSTAT) 0.4 MG SL tablet Place 1 tablet (0.4 mg total) under the tongue every 5 (five) minutes x 3 doses as needed for chest pain.  25 tablet  3  . Ticagrelor (BRILINTA) 90 MG TABS tablet Take 1 tablet (90 mg total) by mouth 2 (two) times daily.  60 tablet  3  . DISCONTD: cloNIDine (CATAPRES) 0.2 MG tablet Take 0.2 mg by mouth 2 (two) times daily.        Marland Kitchen DISCONTD: metoprolol (TOPROL-XL) 50 MG 24 hr tablet Take 50 mg by mouth daily.        Marland Kitchen DISCONTD: niacin (NIASPAN) 500 MG CR tablet Take 500 mg by mouth at bedtime.        Marland Kitchen DISCONTD: omeprazole (PRILOSEC) 20 MG capsule 1 po every morning  30 capsule  5    Past Medical History  Diagnosis Date  . Hypertension   . COPD (chronic obstructive pulmonary disease)   . Hyperlipidemia   . Cardiomyopathy     EF of 30% per echo in June of 2012; admitted with congestive heart failure; nonobstructive CAD on cath in 08/2010  . History of noncompliance with medical treatment   . Cerebrovascular disease 2011    CVA  in 02/2010-only deficit is decreased vision in  right eye  . Alcohol abuse   . Tobacco abuse     40 pack years  .  Abdominal aortic aneurysm     Stent graft repair  . Stroke 2011    decreased vision of right eye  . Chronic systolic CHF (congestive heart failure)   . Leg pain   . CKD (chronic kidney disease) stage 3, GFR 30-59 ml/min     creatinine-1.7 in 08/2010  . CAD (coronary artery disease)     Past Surgical History  Procedure Date  . Abdominal aortic aneurysm repair w/ endoluminal graft     2 yrs ago-[place]  . Colonoscopy w/ polypectomy 2006    Dr. Katrinka Blazing: anal fissure, sessile adenomatous polyp, diverticulosis  . Esophagogastroduodenoscopy 11/15/2010    Procedure: ESOPHAGOGASTRODUODENOSCOPY (EGD);  Surgeon: Arlyce Harman, MD;  Location: [facility];  Service: Endoscopy;  Laterality: N/A;  with propofol sedation; procedure start @ 0813  . Flexible sigmoidoscopy 11/15/2010    Procedure: FLEXIBLE SIGMOIDOSCOPY;   Surgeon: Arlyce Harman, MD;  Location: [facility];  Service: Endoscopy;  Laterality: N/A;  with propofol sedation; ended @ 0808  . Polypectomy 12/13/2010    Procedure: POLYPECTOMY;  Surgeon: Arlyce Harman, MD;  Location: [facility];  Service: Endoscopy;  Laterality: N/A;  right colon, cecal, transverse, and sigmoid polypectomy  . Cardiac catheterization 10/04/11    Normal left main, 95% pLAD stenosis with ulceration s/p successful BMS placement, 30% segmental plaque beyond this, dLAD after D2 with 50% plaquing, then focal 70% lesion, 80% focal short D2 stenosis, 30% pOM2 lesion, 30-40% mOM3 stenosis, Shephard's crook region of RCA with 50% lesion, mRCA 50-60% lesion and mild dRCA irregularities.     JXB:JYNWGN of systems complete and found to be negative unless listed above  PHYSICAL EXAM BP 150/80  Pulse 64  Resp 16  Ht 6\' 3"  (1.905 m)  Wt 164 lb 4 oz (74.503 kg)  BMI 20.53 kg/m2  General: Well developed, well nourished, in no acute distress Head: Eyes PERRLA, No xanthomas.   Normal cephalic and atramatic  Lungs: Clear bilaterally to auscultation and percussion. Heart: HRRR S1 S2, without MRG.  Pulses are 2+ & equal.            No carotid bruit. No JVD.  No abdominal bruits. No femoral bruits. Abdomen: Bowel sounds are positive, abdomen soft and non-tender without masses or                  Hernia's noted. Msk:  Back normal, normal gait. Normal strength and tone for age. Extremities: No clubbing, cyanosis or edema.  DP +1 Neuro: Alert and oriented X 3. Psych: Flat affect, responds appropriately  :  ASSESSMENT AND PLAN

## 2011-10-27 NOTE — Discharge Summary (Signed)
Agree with note. 

## 2011-11-17 ENCOUNTER — Encounter (HOSPITAL_COMMUNITY): Payer: Self-pay

## 2011-11-17 ENCOUNTER — Encounter (HOSPITAL_COMMUNITY)
Admission: RE | Admit: 2011-11-17 | Discharge: 2011-11-17 | Disposition: A | Discharge status: Home / Self Care | Payer: Medicare Other | Source: Ambulatory Visit | Attending: Cardiology | Admitting: Cardiology

## 2011-11-17 VITALS — BP 128/62 | HR 63 | Ht 75.0 in | Wt 165.6 lb

## 2011-11-17 DIAGNOSIS — Z9861 Coronary angioplasty status: Secondary | ICD-10-CM

## 2011-11-17 DIAGNOSIS — I219 Acute myocardial infarction, unspecified: Secondary | ICD-10-CM

## 2011-11-17 HISTORY — DX: Acute myocardial infarction, unspecified: I21.9

## 2011-11-17 NOTE — Progress Notes (Signed)
Patient was referred to cardiac rehab by Dr Dietrich Pates due to  MI  410.90 and stent placement V45.82. During orientation advised patient on arrival and appointment times what to wear, what to do before, during and after exercise. Reviewed attendance and class policy. Talked about inclement weather and class consultation policy. Pt is scheduled to start Cardiac Rehab on  11/23/2011 at 11:00.. Pt was advised to come to class 5 minutes before class starts. He was also given instructions on meeting with the dietician and attending the Family Structure classes. Pt is eager to get started.

## 2011-11-17 NOTE — Patient Instructions (Addendum)
Pt has finished orientation and is scheduled to start CR on 11/23/2011 at 11:00. Pt has been instructed to arrive to class 15 minutes early for scheduled class. Pt has been instructed to wear comfortable clothing and shoes with rubber soles. Pt has been told to take their medications 1 hour prior to coming to class.  If the patient is not going to attend class, he/she has been instructed to call.

## 2011-11-23 ENCOUNTER — Encounter (HOSPITAL_COMMUNITY)
Admission: RE | Admit: 2011-11-23 | Discharge: 2011-11-23 | Disposition: A | Discharge status: Home / Self Care | Payer: Medicare Other | Source: Ambulatory Visit | Attending: Cardiology | Admitting: Cardiology

## 2011-11-23 DIAGNOSIS — Z5189 Encounter for other specified aftercare: Secondary | ICD-10-CM | POA: Insufficient documentation

## 2011-11-23 DIAGNOSIS — I252 Old myocardial infarction: Secondary | ICD-10-CM | POA: Insufficient documentation

## 2011-11-25 ENCOUNTER — Encounter (HOSPITAL_COMMUNITY)
Admission: RE | Admit: 2011-11-25 | Discharge: 2011-11-25 | Disposition: A | Discharge status: Home / Self Care | Payer: Medicare Other | Source: Ambulatory Visit | Attending: Cardiology | Admitting: Cardiology

## 2011-11-28 ENCOUNTER — Encounter (HOSPITAL_COMMUNITY)
Admission: RE | Admit: 2011-11-28 | Discharge: 2011-11-28 | Disposition: A | Discharge status: Home / Self Care | Payer: Medicare Other | Source: Ambulatory Visit | Attending: Cardiology | Admitting: Cardiology

## 2011-11-30 ENCOUNTER — Encounter (HOSPITAL_COMMUNITY)
Admission: RE | Admit: 2011-11-30 | Discharge: 2011-11-30 | Disposition: A | Discharge status: Home / Self Care | Payer: Medicare Other | Source: Ambulatory Visit | Attending: Cardiology | Admitting: Cardiology

## 2011-12-02 ENCOUNTER — Encounter (HOSPITAL_COMMUNITY)
Admission: RE | Admit: 2011-12-02 | Discharge: 2011-12-02 | Disposition: A | Discharge status: Home / Self Care | Payer: Medicare Other | Source: Ambulatory Visit | Attending: Cardiology | Admitting: Cardiology

## 2011-12-05 ENCOUNTER — Encounter (HOSPITAL_COMMUNITY)
Admission: RE | Admit: 2011-12-05 | Discharge: 2011-12-05 | Disposition: A | Discharge status: Home / Self Care | Payer: Medicare Other | Source: Ambulatory Visit | Attending: Cardiology | Admitting: Cardiology

## 2011-12-07 ENCOUNTER — Encounter (HOSPITAL_COMMUNITY)
Admission: RE | Admit: 2011-12-07 | Discharge: 2011-12-07 | Disposition: A | Discharge status: Home / Self Care | Payer: Medicare Other | Source: Ambulatory Visit | Attending: Cardiology | Admitting: Cardiology

## 2011-12-09 ENCOUNTER — Encounter (HOSPITAL_COMMUNITY)
Admission: RE | Admit: 2011-12-09 | Discharge: 2011-12-09 | Disposition: A | Discharge status: Home / Self Care | Payer: Medicare Other | Source: Ambulatory Visit | Attending: Cardiology | Admitting: Cardiology

## 2011-12-12 ENCOUNTER — Encounter (HOSPITAL_COMMUNITY): Payer: Medicare Other

## 2011-12-14 ENCOUNTER — Encounter (HOSPITAL_COMMUNITY)
Admission: RE | Admit: 2011-12-14 | Discharge: 2011-12-14 | Disposition: A | Discharge status: Home / Self Care | Payer: Medicare Other | Source: Ambulatory Visit | Attending: Cardiology | Admitting: Cardiology

## 2011-12-16 ENCOUNTER — Encounter (HOSPITAL_COMMUNITY)
Admission: RE | Admit: 2011-12-16 | Discharge: 2011-12-16 | Disposition: A | Discharge status: Home / Self Care | Payer: Medicare Other | Source: Ambulatory Visit | Attending: Cardiology | Admitting: Cardiology

## 2011-12-19 ENCOUNTER — Encounter (HOSPITAL_COMMUNITY)
Admission: RE | Admit: 2011-12-19 | Discharge: 2011-12-19 | Disposition: A | Discharge status: Home / Self Care | Payer: Medicare Other | Source: Ambulatory Visit | Attending: Cardiology | Admitting: Cardiology

## 2011-12-21 ENCOUNTER — Encounter (HOSPITAL_COMMUNITY)
Admission: RE | Admit: 2011-12-21 | Discharge: 2011-12-21 | Disposition: A | Discharge status: Home / Self Care | Payer: Medicare Other | Source: Ambulatory Visit | Attending: Cardiology | Admitting: Cardiology

## 2011-12-21 DIAGNOSIS — I252 Old myocardial infarction: Secondary | ICD-10-CM | POA: Insufficient documentation

## 2011-12-21 DIAGNOSIS — Z5189 Encounter for other specified aftercare: Secondary | ICD-10-CM | POA: Insufficient documentation

## 2011-12-23 ENCOUNTER — Encounter (HOSPITAL_COMMUNITY)
Admission: RE | Admit: 2011-12-23 | Discharge: 2011-12-23 | Disposition: A | Discharge status: Home / Self Care | Payer: Medicare Other | Source: Ambulatory Visit | Attending: Cardiology | Admitting: Cardiology

## 2011-12-26 ENCOUNTER — Encounter (HOSPITAL_COMMUNITY)
Admission: RE | Admit: 2011-12-26 | Discharge: 2011-12-26 | Disposition: A | Discharge status: Home / Self Care | Payer: Medicare Other | Source: Ambulatory Visit | Attending: Cardiology | Admitting: Cardiology

## 2011-12-28 ENCOUNTER — Encounter (HOSPITAL_COMMUNITY)
Admission: RE | Admit: 2011-12-28 | Discharge: 2011-12-28 | Disposition: A | Discharge status: Home / Self Care | Payer: Medicare Other | Source: Ambulatory Visit | Attending: Cardiology | Admitting: Cardiology

## 2011-12-30 ENCOUNTER — Encounter (HOSPITAL_COMMUNITY)
Admission: RE | Admit: 2011-12-30 | Discharge: 2011-12-30 | Disposition: A | Discharge status: Home / Self Care | Payer: Medicare Other | Source: Ambulatory Visit | Attending: Cardiology | Admitting: Cardiology

## 2012-01-02 ENCOUNTER — Encounter (HOSPITAL_COMMUNITY)
Admission: RE | Admit: 2012-01-02 | Discharge: 2012-01-02 | Disposition: A | Discharge status: Home / Self Care | Payer: Medicare Other | Source: Ambulatory Visit | Attending: Cardiology | Admitting: Cardiology

## 2012-01-04 ENCOUNTER — Encounter (HOSPITAL_COMMUNITY)
Admission: RE | Admit: 2012-01-04 | Discharge: 2012-01-04 | Disposition: A | Discharge status: Home / Self Care | Payer: Medicare Other | Source: Ambulatory Visit | Attending: Cardiology | Admitting: Cardiology

## 2012-01-06 ENCOUNTER — Encounter (HOSPITAL_COMMUNITY)
Admission: RE | Admit: 2012-01-06 | Discharge: 2012-01-06 | Disposition: A | Discharge status: Home / Self Care | Payer: Medicare Other | Source: Ambulatory Visit | Attending: Cardiology | Admitting: Cardiology

## 2012-01-09 ENCOUNTER — Encounter (HOSPITAL_COMMUNITY): Payer: Medicare Other

## 2012-01-11 ENCOUNTER — Encounter (HOSPITAL_COMMUNITY)
Admission: RE | Admit: 2012-01-11 | Discharge: 2012-01-11 | Disposition: A | Discharge status: Home / Self Care | Payer: Medicare Other | Source: Ambulatory Visit | Attending: Cardiology | Admitting: Cardiology

## 2012-01-13 ENCOUNTER — Encounter (HOSPITAL_COMMUNITY)
Admission: RE | Admit: 2012-01-13 | Discharge: 2012-01-13 | Disposition: A | Discharge status: Home / Self Care | Payer: Medicare Other | Source: Ambulatory Visit | Attending: Cardiology | Admitting: Cardiology

## 2012-01-16 ENCOUNTER — Encounter (HOSPITAL_COMMUNITY)
Admission: RE | Admit: 2012-01-16 | Discharge: 2012-01-16 | Disposition: A | Discharge status: Home / Self Care | Payer: Medicare Other | Source: Ambulatory Visit | Attending: Cardiology | Admitting: Cardiology

## 2012-01-18 ENCOUNTER — Encounter (HOSPITAL_COMMUNITY)
Admission: RE | Admit: 2012-01-18 | Discharge: 2012-01-18 | Disposition: A | Discharge status: Home / Self Care | Payer: Medicare Other | Source: Ambulatory Visit | Attending: Cardiology | Admitting: Cardiology

## 2012-01-20 ENCOUNTER — Encounter (HOSPITAL_COMMUNITY): Payer: Medicare Other

## 2012-01-23 ENCOUNTER — Encounter (HOSPITAL_COMMUNITY)
Admission: RE | Admit: 2012-01-23 | Discharge: 2012-01-23 | Disposition: A | Discharge status: Home / Self Care | Payer: Medicare Other | Source: Ambulatory Visit | Attending: Cardiology | Admitting: Cardiology

## 2012-01-23 DIAGNOSIS — I252 Old myocardial infarction: Secondary | ICD-10-CM | POA: Insufficient documentation

## 2012-01-23 DIAGNOSIS — Z5189 Encounter for other specified aftercare: Secondary | ICD-10-CM | POA: Insufficient documentation

## 2012-01-25 ENCOUNTER — Encounter (HOSPITAL_COMMUNITY)
Admission: RE | Admit: 2012-01-25 | Discharge: 2012-01-25 | Disposition: A | Discharge status: Home / Self Care | Payer: Medicare Other | Source: Ambulatory Visit | Attending: Cardiology | Admitting: Cardiology

## 2012-01-27 ENCOUNTER — Encounter (HOSPITAL_COMMUNITY): Payer: Medicare Other

## 2012-01-30 ENCOUNTER — Encounter (HOSPITAL_COMMUNITY)
Admission: RE | Admit: 2012-01-30 | Discharge: 2012-01-30 | Disposition: A | Discharge status: Home / Self Care | Payer: Medicare Other | Source: Ambulatory Visit | Attending: Cardiology | Admitting: Cardiology

## 2012-02-01 ENCOUNTER — Encounter (HOSPITAL_COMMUNITY)
Admission: RE | Admit: 2012-02-01 | Discharge: 2012-02-01 | Disposition: A | Discharge status: Home / Self Care | Payer: Medicare Other | Source: Ambulatory Visit | Attending: Cardiology | Admitting: Cardiology

## 2012-02-03 ENCOUNTER — Encounter (HOSPITAL_COMMUNITY)
Admission: RE | Admit: 2012-02-03 | Discharge: 2012-02-03 | Disposition: A | Discharge status: Home / Self Care | Payer: Medicare Other | Source: Ambulatory Visit | Attending: Cardiology | Admitting: Cardiology

## 2012-02-06 ENCOUNTER — Encounter (HOSPITAL_COMMUNITY): Payer: Medicare Other

## 2012-02-08 ENCOUNTER — Encounter (HOSPITAL_COMMUNITY)
Admission: RE | Admit: 2012-02-08 | Discharge: 2012-02-08 | Disposition: A | Discharge status: Home / Self Care | Payer: Medicare Other | Source: Ambulatory Visit | Attending: Cardiology | Admitting: Cardiology

## 2012-02-10 ENCOUNTER — Encounter (HOSPITAL_COMMUNITY)
Admission: RE | Admit: 2012-02-10 | Discharge: 2012-02-10 | Disposition: A | Discharge status: Home / Self Care | Payer: Medicare Other | Source: Ambulatory Visit | Attending: Cardiology | Admitting: Cardiology

## 2012-02-13 ENCOUNTER — Encounter (HOSPITAL_COMMUNITY)
Admission: RE | Admit: 2012-02-13 | Discharge: 2012-02-13 | Disposition: A | Discharge status: Home / Self Care | Payer: Medicare Other | Source: Ambulatory Visit | Attending: Cardiology | Admitting: Cardiology

## 2012-02-15 ENCOUNTER — Encounter (HOSPITAL_COMMUNITY)
Admission: RE | Admit: 2012-02-15 | Discharge: 2012-02-15 | Disposition: A | Discharge status: Home / Self Care | Payer: Medicare Other | Source: Ambulatory Visit | Attending: Cardiology | Admitting: Cardiology

## 2012-02-17 ENCOUNTER — Encounter (HOSPITAL_COMMUNITY): Payer: Medicare Other

## 2012-02-20 ENCOUNTER — Encounter (HOSPITAL_COMMUNITY)
Admission: RE | Admit: 2012-02-20 | Discharge: 2012-02-20 | Disposition: A | Discharge status: Home / Self Care | Payer: Medicare Other | Source: Ambulatory Visit | Attending: Cardiology | Admitting: Cardiology

## 2012-02-20 DIAGNOSIS — Z5189 Encounter for other specified aftercare: Secondary | ICD-10-CM | POA: Insufficient documentation

## 2012-02-20 DIAGNOSIS — I252 Old myocardial infarction: Secondary | ICD-10-CM | POA: Insufficient documentation

## 2012-02-22 ENCOUNTER — Encounter (HOSPITAL_COMMUNITY)
Admission: RE | Admit: 2012-02-22 | Discharge: 2012-02-22 | Disposition: A | Discharge status: Home / Self Care | Payer: Medicare Other | Source: Ambulatory Visit | Attending: Cardiology | Admitting: Cardiology

## 2012-02-24 ENCOUNTER — Encounter (HOSPITAL_COMMUNITY)
Admission: RE | Admit: 2012-02-24 | Discharge: 2012-02-24 | Disposition: A | Discharge status: Home / Self Care | Payer: Medicare Other | Source: Ambulatory Visit | Attending: Cardiology | Admitting: Cardiology

## 2012-02-27 ENCOUNTER — Other Ambulatory Visit: Payer: Self-pay | Admitting: Cardiology

## 2012-02-27 ENCOUNTER — Encounter (HOSPITAL_COMMUNITY)
Admission: RE | Admit: 2012-02-27 | Discharge: 2012-02-27 | Disposition: A | Discharge status: Home / Self Care | Payer: Medicare Other | Source: Ambulatory Visit | Attending: Cardiology | Admitting: Cardiology

## 2012-02-27 MED ORDER — TICAGRELOR 90 MG PO TABS: 90.0000 mg | ORAL_TABLET | Freq: Two times a day (BID) | ORAL | Status: DC

## 2012-02-29 ENCOUNTER — Encounter (HOSPITAL_COMMUNITY): Payer: Medicare Other

## 2012-03-04 ENCOUNTER — Other Ambulatory Visit: Payer: Self-pay | Admitting: Adult Health

## 2012-03-16 NOTE — Progress Notes (Signed)
Cardiac Rehabilitation Program Outcomes Report   Orientation:  11/17/2011 !8th visit: 01/04/2012 Graduate Date:  tbd Discharge Date:  tbd # of sessions completed: 18 Dx MI and Stent X 1  Cardiologist: [place] Bing Family MD:  Lubertha South Class Time:  11:00  A.  Exercise Program:  Tolerates exercise @ 4.01 METS for 15 minutes  B.  Mental Health:  Good mental attitude  C.  Education/Instruction/Skills  Knows THR for exercise and Uses Perceived Exertion Scale and/or Dyspnea Scale  Uses Perceived Exertion Scale and/or Dyspnea Scale  D.  Nutrition/Weight Control/Body Composition:  Adherence to prescribed nutrition program: good    E.  Blood Lipids    Lab Results  Component Value Date   CHOL 201* 10/02/2011   HDL 39* 10/02/2011   LDLCALC 149* 10/02/2011   TRIG 66 10/02/2011   CHOLHDL 5.2 10/02/2011    F.  Lifestyle Changes:  Making positive lifestyle changes and Continues to smoke  G.  Symptoms noted with exercise:  Asymptomatic  Report Completed By:  Lelon Huh. Heylee Tant RN   Comments:  This is patients halfway report. He achieved a peak METS of 4.01. His resting HR was 64 and  Resting  BP was 132/80, His peak HR is 91 and his peak BP was 164/96. A graduation report will follow.

## 2012-03-16 NOTE — Progress Notes (Signed)
Cardiac Rehabilitation Program Outcomes Report   Orientation:  11/17/2011 1st week ; 11/28/2011 Graduate Date:  tbd Discharge Date:  tbd # of sessions completed: 3 DX: Myocardial Infarction and Stent X 1  Cardiologist: [place] Bing Family MD:  Dr. Lubertha South Class Time:  11:00  A.  Exercise Program:  Tolerates exercise @ 4.01 METS for 15 minutes and Bike Test Results:  Pre: Pre BP 128/62 and HR 63, 3 minute BP was 186/90 and HR 75 RPE 10 02/ 93% RPD 11 Post BP 154/82 HR 56 Distance 1.0 mile  B.  Mental Health:  Good mental attitude  C.  Education/Instruction/Skills  Knows THR for exercise and Uses Perceived Exertion Scale and/or Dyspnea Scale  Uses Perceived Exertion Scale and/or Dyspnea Scale  D.  Nutrition/Weight Control/Body Composition:  Adherence to prescribed nutrition program: good    E.  Blood Lipids    Lab Results  Component Value Date   CHOL 201* 10/02/2011   HDL 39* 10/02/2011   LDLCALC 149* 10/02/2011   TRIG 66 10/02/2011   CHOLHDL 5.2 10/02/2011    F.  Lifestyle Changes:  Making positive lifestyle changes  G.  Symptoms noted with exercise:  Asymptomatic  Report Completed By:  Lelon Huh. Ameirah Khatoon RN   Comments:  This is patients 1st week report. He achieved a Peak METS of 4.01. His resting BP was 54 and BP of 182/72, His peak BP was 78 and peak BP was 182/88. A halfway report will follow on his 18th visit.

## 2012-03-30 ENCOUNTER — Encounter: Payer: Self-pay | Admitting: Surgery

## 2012-04-02 ENCOUNTER — Encounter (INDEPENDENT_AMBULATORY_CARE_PROVIDER_SITE_OTHER): Payer: Medicare Other | Admitting: *Deleted

## 2012-04-02 ENCOUNTER — Encounter: Payer: Self-pay | Admitting: Surgery

## 2012-04-02 ENCOUNTER — Ambulatory Visit (INDEPENDENT_AMBULATORY_CARE_PROVIDER_SITE_OTHER): Payer: Medicare Other | Admitting: Surgery

## 2012-04-02 ENCOUNTER — Other Ambulatory Visit (INDEPENDENT_AMBULATORY_CARE_PROVIDER_SITE_OTHER): Payer: Medicare Other | Admitting: *Deleted

## 2012-04-02 VITALS — BP 170/70 | HR 51 | Ht 75.0 in | Wt 166.5 lb

## 2012-04-02 DIAGNOSIS — I714 Abdominal aortic aneurysm, without rupture: Secondary | ICD-10-CM

## 2012-04-02 DIAGNOSIS — I6529 Occlusion and stenosis of unspecified carotid artery: Secondary | ICD-10-CM

## 2012-04-02 DIAGNOSIS — Z48812 Encounter for surgical aftercare following surgery on the circulatory system: Secondary | ICD-10-CM

## 2012-04-02 NOTE — Addendum Note (Signed)
Addended by: Dannielle Karvonen on: 04/02/2012 02:23 PM   Modules accepted: Orders

## 2012-04-02 NOTE — Progress Notes (Signed)
[facility] [place]   Patient name: [name] MRN: [id number] DOB: 26-Mar-1945 Sex: male     Chief Complaint  Patient presents with  . AAA    1 yr f/u - pt has no complaints    HISTORY OF PRESENT ILLNESS: The patient is back today for followup. He is status post endovascular aneurysm repair on 01/16/2008 using a Cook Zenith device. He has had no complications from his aneurysm. When I last saw him there was a slight increase in size. He denies any abdominal pain. He did suffer a heart attack in July 2013. He has just completed cardiac rehabilitation. A coronary stent was placed.  He continues to be managed with a statin for his hypercholesterolemia as well as an ACE inhibitor for his pressure.  Past Medical History  Diagnosis Date  . Hypertension   . COPD (chronic obstructive pulmonary disease)   . Hyperlipidemia   . Cardiomyopathy     EF of 30% per echo in June of 2012; admitted with congestive heart failure; nonobstructive CAD on cath in 08/2010  . History of noncompliance with medical treatment   . Cerebrovascular disease 2011    CVA  in 02/2010-only deficit is decreased vision in  right eye  . Alcohol abuse   . Tobacco abuse     40 pack years  . Abdominal aortic aneurysm     Stent graft repair  . Stroke 2011    decreased vision of right eye  . Chronic systolic CHF (congestive heart failure)   . Leg pain   . CKD (chronic kidney disease) stage 3, GFR 30-59 ml/min     creatinine-1.7 in 08/2010  . CAD (coronary artery disease)   . Myocardial infarction acute 10/01/11    Past Surgical History  Procedure Date  . Colonoscopy w/ polypectomy 2006    Dr. Katrinka Blazing: anal fissure, sessile adenomatous polyp, diverticulosis  . Esophagogastroduodenoscopy 11/15/2010    Procedure: ESOPHAGOGASTRODUODENOSCOPY (EGD);  Surgeon: Arlyce Harman, MD;  Location: [facility];  Service: Endoscopy;  Laterality: N/A;  with propofol sedation; procedure start @ 0813  . Flexible  sigmoidoscopy 11/15/2010    Procedure: FLEXIBLE SIGMOIDOSCOPY;  Surgeon: Arlyce Harman, MD;  Location: [facility];  Service: Endoscopy;  Laterality: N/A;  with propofol sedation; ended @ 0808  . Polypectomy 12/13/2010    Procedure: POLYPECTOMY;  Surgeon: Arlyce Harman, MD;  Location: [facility];  Service: Endoscopy;  Laterality: N/A;  right colon, cecal, transverse, and sigmoid polypectomy  . Cardiac catheterization 10/04/11    Normal left main, 95% pLAD stenosis with ulceration s/p successful BMS placement, 30% segmental plaque beyond this, dLAD after D2 with 50% plaquing, then focal 70% lesion, 80% focal short D2 stenosis, 30% pOM2 lesion, 30-40% mOM3 stenosis, Shephard's crook region of RCA with 50% lesion, mRCA 50-60% lesion and mild dRCA irregularities.   . Coronary stent placement 10/04/11  . Abdominal aortic aneurysm repair w/ endoluminal graft     01/16/2008    History   Social History  . Marital Status: Married    Spouse Name: N/A    Number of Children: N/A  . Years of Education: N/A   Occupational History  . Not on file.   Social History Main Topics  . Smoking status: Current Every Day Smoker -- 0.5 packs/day for 50 years    Types: Cigarettes  . Smokeless tobacco: Never Used  . Alcohol Use: 16.8 oz/week    28 Shots of liquor per week  Comment: quit about 1 month ago, prior to this would drink about 7 oz of liquor per day  . Drug Use: No  . Sexually Active: No   Other Topics Concern  . Not on file   Social History Narrative  . No narrative on file    Family History  Problem Relation Age of Onset  . Colon cancer Neg Hx   . Anesthesia problems Neg Hx   . Hypotension Neg Hx   . Malignant hyperthermia Neg Hx   . Pseudochol deficiency Neg Hx   . Cancer Mother   . Heart disease Mother     before age 39  . Hyperlipidemia Mother   . Hypertension Mother     Allergies as of 04/02/2012  . (No Known Allergies)    Current Outpatient Prescriptions on File Prior to Visit    Medication Sig Dispense Refill  . amLODipine (NORVASC) 5 MG tablet TAKE 1 TABLET (5 MG TOTAL) BY MOUTH DAILY.  30 tablet  3  . aspirin EC 81 MG EC tablet Take 1 tablet (81 mg total) by mouth daily.      Marland Kitchen atorvastatin (LIPITOR) 80 MG tablet TAKE 1 TABLET (80 MG TOTAL) BY MOUTH DAILY AT 6 PM.  30 tablet  3  . carvedilol (COREG) 6.25 MG tablet TAKE 1 TABLET (6.25 MG TOTAL) BY MOUTH 2 (TWO) TIMES DAILY WITH A MEAL.  60 tablet  3  . Multiple Vitamin (MULTIVITAMIN WITH MINERALS) TABS Take 1 tablet by mouth daily.      . nitroGLYCERIN (NITROSTAT) 0.4 MG SL tablet Place 1 tablet (0.4 mg total) under the tongue every 5 (five) minutes x 3 doses as needed for chest pain.  25 tablet  3  . Ticagrelor (BRILINTA) 90 MG TABS tablet Take 1 tablet (90 mg total) by mouth 2 (two) times daily.  60 tablet  3  . WELCHOL 625 MG tablet TAKE 2 TABLETS (1,250 MG TOTAL) BY MOUTH 2 (TWO) TIMES DAILY.  120 tablet  3  . [DISCONTINUED] cloNIDine (CATAPRES) 0.2 MG tablet Take 0.2 mg by mouth 2 (two) times daily.        . [DISCONTINUED] metoprolol (TOPROL-XL) 50 MG 24 hr tablet Take 50 mg by mouth daily.        . [DISCONTINUED] niacin (NIASPAN) 500 MG CR tablet Take 500 mg by mouth at bedtime.        . [DISCONTINUED] omeprazole (PRILOSEC) 20 MG capsule 1 po every morning  30 capsule  5     REVIEW OF SYSTEMS: No changes from prior visit  PHYSICAL EXAMINATION:   Vital signs are BP 170/70  Pulse 51  Ht 6\' 3"  (1.905 m)  Wt 166 lb 8 oz (75.524 kg)  BMI 20.81 kg/m2  SpO2 100% General: The patient appears their stated age. HEENT:  No gross abnormalities Pulmonary:  Non labored breathing Abdomen: Soft and non-tender. Aorta is not palpable Musculoskeletal: There are no major deformities. Neurologic: No focal weakness or paresthesias are detected, Skin: There are no ulcer or rashes noted. Psychiatric: The patient has normal affect. Cardiovascular: There is a regular rate and rhythm without significant murmur appreciated.  No carotid bruits   Diagnostic Studies Ultrasound was reviewed from today. This shows the aneurysm measures 3.25 x 3.28. Previously it was 3.24 x 3.34. There is been no significant interval change.  Assessment: Status post and vas aneurysm repair, 2009 Plan: The patient continues to do very well. Ultrasound today shows his aneurysm sac has remained stable if  not decreased in size. He has no vascular issues currently. He will come back for routine surveillance ultrasound in one year  V. Charlena Cross, M.D. [facility] [place] Office: [phone] Pager:  [phone]

## 2012-04-09 ENCOUNTER — Ambulatory Visit: Payer: Medicare Other | Admitting: Surgery

## 2012-05-21 ENCOUNTER — Telehealth: Payer: Self-pay | Admitting: *Deleted

## 2012-05-21 NOTE — Telephone Encounter (Signed)
Error/tg °

## 2012-05-31 ENCOUNTER — Encounter: Payer: Self-pay | Admitting: Adult Health

## 2012-05-31 NOTE — Progress Notes (Signed)
HPI: : [name] is a 67 year old patient of Dr. [employer] Bing who we are following for multiple cardiovascular issues. He is recently been admitted to [facility] on 10/01/2011 in the setting of acute respiratory distress and non-ST elevation MI. He had a cardiac catheterization completed revealing critical proximal LAD lesion with large vessel distribution. This was successfully stented with a bare-metal stent. He did have some residual LAD disease and the films were reviewed by 2 cardiologists. It was felt that medical management was appropriate. He was last seen in July 2013 and was without complaints. He was trying to get into an exercise program.  He comes today without any cardiac complaints. He is not very active but is medically complaint.    No Known Allergies  Current Outpatient Prescriptions  Medication Sig Dispense Refill  . amLODipine (NORVASC) 5 MG tablet TAKE 1 TABLET (5 MG TOTAL) BY MOUTH DAILY.  30 tablet  3  . aspirin EC 81 MG EC tablet Take 1 tablet (81 mg total) by mouth daily.      Marland Kitchen atorvastatin (LIPITOR) 80 MG tablet TAKE 1 TABLET (80 MG TOTAL) BY MOUTH DAILY AT 6 PM.  30 tablet  3  . carvedilol (COREG) 6.25 MG tablet TAKE 1 TABLET (6.25 MG TOTAL) BY MOUTH 2 (TWO) TIMES DAILY WITH A MEAL.  60 tablet  3  . Multiple Vitamin (MULTIVITAMIN WITH MINERALS) TABS Take 1 tablet by mouth daily.      . nitroGLYCERIN (NITROSTAT) 0.4 MG SL tablet Place 1 tablet (0.4 mg total) under the tongue every 5 (five) minutes x 3 doses as needed for chest pain.  25 tablet  3  . Ticagrelor (BRILINTA) 90 MG TABS tablet Take 1 tablet (90 mg total) by mouth 2 (two) times daily.  60 tablet  3  . WELCHOL 625 MG tablet TAKE 2 TABLETS (1,250 MG TOTAL) BY MOUTH 2 (TWO) TIMES DAILY.  120 tablet  3  . [DISCONTINUED] cloNIDine (CATAPRES) 0.2 MG tablet Take 0.2 mg by mouth 2 (two) times daily.        . [DISCONTINUED] metoprolol (TOPROL-XL) 50 MG 24 hr tablet Take 50 mg by mouth daily.        .  [DISCONTINUED] niacin (NIASPAN) 500 MG CR tablet Take 500 mg by mouth at bedtime.        . [DISCONTINUED] omeprazole (PRILOSEC) 20 MG capsule 1 po every morning  30 capsule  5   No current facility-administered medications for this visit.    Past Medical History  Diagnosis Date  . Hypertension   . COPD (chronic obstructive pulmonary disease)   . Hyperlipidemia   . Cardiomyopathy     EF of 30% per echo in June of 2012; admitted with congestive heart failure; nonobstructive CAD on cath in 08/2010  . History of noncompliance with medical treatment   . Cerebrovascular disease 2011    CVA  in 02/2010-only deficit is decreased vision in  right eye  . Alcohol abuse   . Tobacco abuse     40 pack years  . Abdominal aortic aneurysm     Stent graft repair  . Stroke 2011    decreased vision of right eye  . Chronic systolic CHF (congestive heart failure)   . Leg pain   . CKD (chronic kidney disease) stage 3, GFR 30-59 ml/min     creatinine-1.7 in 08/2010  . CAD (coronary artery disease)   . Myocardial infarction acute 10/01/11    Past Surgical History  Procedure Laterality Date  . Colonoscopy w/ polypectomy  2006    Dr. Katrinka Blazing: anal fissure, sessile adenomatous polyp, diverticulosis  . Esophagogastroduodenoscopy  11/15/2010    Procedure: ESOPHAGOGASTRODUODENOSCOPY (EGD);  Surgeon: Arlyce Harman, MD;  Location: [facility];  Service: Endoscopy;  Laterality: N/A;  with propofol sedation; procedure start @ 0813  . Flexible sigmoidoscopy  11/15/2010    Procedure: FLEXIBLE SIGMOIDOSCOPY;  Surgeon: Arlyce Harman, MD;  Location: [facility];  Service: Endoscopy;  Laterality: N/A;  with propofol sedation; ended @ 0808  . Polypectomy  12/13/2010    Procedure: POLYPECTOMY;  Surgeon: Arlyce Harman, MD;  Location: [facility];  Service: Endoscopy;  Laterality: N/A;  right colon, cecal, transverse, and sigmoid polypectomy  . Cardiac catheterization  10/04/11    Normal left main, 95% pLAD stenosis with ulceration s/p  successful BMS placement, 30% segmental plaque beyond this, dLAD after D2 with 50% plaquing, then focal 70% lesion, 80% focal short D2 stenosis, 30% pOM2 lesion, 30-40% mOM3 stenosis, Shephard's crook region of RCA with 50% lesion, mRCA 50-60% lesion and mild dRCA irregularities.   . Coronary stent placement  10/04/11  . Abdominal aortic aneurysm repair w/ endoluminal graft      01/16/2008    ZOX:[id number] of systems complete and found to be negative unless listed above  PHYSICAL EXAM There were no vitals taken for this visit. General: Well developed, well nourished, in no acute distress Head: Eyes PERRLA, No xanthomas.   Normal cephalic and atramatic  Lungs: Clear bilaterally to auscultation and percussion. Heart: HRRR S1 S2, bradycardic  With 1/6 systolic BP..  Pulses are 2+ & equal.            No carotid bruit. No JVD.  No abdominal bruits. No femoral bruits. Abdomen: Bowel sounds are positive, abdomen soft and non-tender without masses or                  Hernia's noted. Msk:  Back normal, normal gait. Normal strength and tone for age. Extremities: No clubbing, cyanosis or edema.  DP +1 Neuro: Alert and oriented X 3. Psych:  Good affect, responds appropriately  EKG: NSR with anterio/lateral/septal T-wave abnormalities, chronic.  ASSESSMENT AND PLAN

## 2012-06-01 ENCOUNTER — Ambulatory Visit (INDEPENDENT_AMBULATORY_CARE_PROVIDER_SITE_OTHER): Payer: Medicare Other | Admitting: Adult Health

## 2012-06-01 ENCOUNTER — Encounter: Payer: Self-pay | Admitting: Adult Health

## 2012-06-01 VITALS — BP 128/76 | HR 449 | Ht 75.0 in | Wt 166.0 lb

## 2012-06-01 DIAGNOSIS — I2510 Atherosclerotic heart disease of native coronary artery without angina pectoris: Secondary | ICD-10-CM

## 2012-06-01 DIAGNOSIS — E785 Hyperlipidemia, unspecified: Secondary | ICD-10-CM

## 2012-06-01 DIAGNOSIS — I119 Hypertensive heart disease without heart failure: Secondary | ICD-10-CM

## 2012-06-01 MED ORDER — CARVEDILOL 6.25 MG PO TABS: 6.2500 mg | ORAL_TABLET | Freq: Two times a day (BID) | ORAL | Status: DC

## 2012-06-01 MED ORDER — ATORVASTATIN CALCIUM 80 MG PO TABS: 80.0000 mg | ORAL_TABLET | Freq: Every day | ORAL | Status: DC

## 2012-06-01 MED ORDER — AMLODIPINE BESYLATE 5 MG PO TABS: ORAL_TABLET | ORAL | Status: DC

## 2012-06-01 MED ORDER — TICAGRELOR 90 MG PO TABS: 90.0000 mg | ORAL_TABLET | Freq: Two times a day (BID) | ORAL | Status: DC

## 2012-06-01 NOTE — Assessment & Plan Note (Signed)
I have ordered followup lipids and LFTs in 3 months for ongoing assessment and management. He is to continue adhering to a low cholesterol diet continuation of statin therapy.

## 2012-06-01 NOTE — Assessment & Plan Note (Signed)
He is doing well. Weight is stable, there is no evidence of fluid retention, he is denying any dyspnea on exertion or problems concerning edema. He is medically compliant with the assistance of his daughter. He remains very sedentary. He will become more active as the weather becomes warmer. There is some evidence of mild dementia which to some closer to home.

## 2012-06-01 NOTE — Patient Instructions (Addendum)
Your physician recommends that you return for lab work in: 3 months for fasting cholesterol labs  Your physician recommends that you continue on your current medications as directed. Please refer to the Current Medication list given to you today.  Your physician recommends that you schedule a follow-up appointment in: 6 months with Lorin Picket

## 2012-06-01 NOTE — Assessment & Plan Note (Signed)
He is without cardiac complaints. Continue risk management. We will followup with him in 6 months unless he becomes symptomatic. All cardiac medications are refilled.

## 2012-06-01 NOTE — Addendum Note (Signed)
Addended by: Lisabeth Devoid F on: 06/01/2012 02:10 PM   Modules accepted: Orders

## 2012-06-04 NOTE — Addendum Note (Signed)
Addended by: Derry Lory A on: 06/04/2012 01:33 PM   Modules accepted: Orders

## 2012-06-20 ENCOUNTER — Telehealth: Payer: Self-pay | Admitting: *Deleted

## 2012-06-20 NOTE — Telephone Encounter (Signed)
OPENED IN ERROR

## 2012-06-21 ENCOUNTER — Ambulatory Visit: Payer: Medicare Other | Admitting: Cardiology

## 2012-06-30 ENCOUNTER — Other Ambulatory Visit: Payer: Self-pay | Admitting: Adult Health

## 2012-07-02 ENCOUNTER — Other Ambulatory Visit: Payer: Self-pay | Admitting: Adult Health

## 2012-07-04 ENCOUNTER — Other Ambulatory Visit: Payer: Self-pay | Admitting: *Deleted

## 2012-07-04 ENCOUNTER — Encounter: Payer: Self-pay | Admitting: *Deleted

## 2012-07-04 DIAGNOSIS — E785 Hyperlipidemia, unspecified: Secondary | ICD-10-CM

## 2012-08-15 LAB — HEPATIC FUNCTION PANEL
Alkaline Phosphatase: 125 U/L — ABNORMAL HIGH (ref 39–117)
Bilirubin, Direct: 0.2 mg/dL (ref 0.0–0.3)
Indirect Bilirubin: 0.4 mg/dL (ref 0.0–0.9)

## 2012-08-15 LAB — LIPID PANEL
LDL Cholesterol: 92 mg/dL (ref 0–99)
VLDL: 11 mg/dL (ref 0–40)

## 2012-09-11 ENCOUNTER — Ambulatory Visit (INDEPENDENT_AMBULATORY_CARE_PROVIDER_SITE_OTHER): Payer: 59 | Admitting: Family Medicine

## 2012-09-11 ENCOUNTER — Encounter: Payer: Self-pay | Admitting: Family Medicine

## 2012-09-11 VITALS — BP 138/84 | HR 69 | Wt 159.8 lb

## 2012-09-11 DIAGNOSIS — IMO0002 Reserved for concepts with insufficient information to code with codable children: Secondary | ICD-10-CM

## 2012-09-11 DIAGNOSIS — T148XXA Other injury of unspecified body region, initial encounter: Secondary | ICD-10-CM

## 2012-09-11 NOTE — Progress Notes (Signed)
  Subjective:    Patient ID: JODECI ROARTY, male    DOB: 1945-04-06, 67 y.o.   MRN: [id number]  HPI Testing 123. Patient arrives office with ankle injury. Struck a hard surface. Couple days ago. Left lateral ankle. Significant bleeding since. No history of bleeding disorder. Does take low-grade aspirin.   Review of Systems No fever no chills no bleeding elsewhere ROS otherwise negative    Objective:   Physical Exam  Alert lungs clear heart regular in rhythm H&T normal. Lateral ankle impressive bleed patient was prepped sore nitrate sticks applied. Bleeding was stemmed.      Assessment & Plan:  Impression venous cutaneous injury with seeping blood. Plan cauterize with silver nitrate local measures discussed expect slow resolution.

## 2012-09-11 NOTE — Patient Instructions (Signed)
Leave this dressing on til tomorrow. Then change twice per day next few days.

## 2012-09-12 ENCOUNTER — Encounter: Payer: Self-pay | Admitting: *Deleted

## 2012-10-03 ENCOUNTER — Ambulatory Visit (INDEPENDENT_AMBULATORY_CARE_PROVIDER_SITE_OTHER): Payer: Medicare Other | Admitting: Family Medicine

## 2012-10-03 ENCOUNTER — Encounter: Payer: Self-pay | Admitting: Family Medicine

## 2012-10-03 VITALS — BP 118/82 | Wt 158.4 lb

## 2012-10-03 DIAGNOSIS — Z79899 Other long term (current) drug therapy: Secondary | ICD-10-CM

## 2012-10-03 DIAGNOSIS — I679 Cerebrovascular disease, unspecified: Secondary | ICD-10-CM

## 2012-10-03 DIAGNOSIS — F05 Delirium due to known physiological condition: Secondary | ICD-10-CM

## 2012-10-03 DIAGNOSIS — R270 Ataxia, unspecified: Secondary | ICD-10-CM

## 2012-10-03 DIAGNOSIS — R279 Unspecified lack of coordination: Secondary | ICD-10-CM

## 2012-10-03 DIAGNOSIS — I119 Hypertensive heart disease without heart failure: Secondary | ICD-10-CM

## 2012-10-03 DIAGNOSIS — Z125 Encounter for screening for malignant neoplasm of prostate: Secondary | ICD-10-CM

## 2012-10-03 DIAGNOSIS — E782 Mixed hyperlipidemia: Secondary | ICD-10-CM

## 2012-10-03 DIAGNOSIS — E785 Hyperlipidemia, unspecified: Secondary | ICD-10-CM

## 2012-10-03 NOTE — Progress Notes (Signed)
  Subjective:    Patient ID: [name], male    DOB: [birth year], 67 y.o.   MRN: [id number]  HPI Had loose stool and stool losss. At times does not even seem to realize he is losing a bit of stool. This is been going on for a couple years. Worse recently. Recently had a bout of loose stools where it was more challenging.  Unsteady at times. History of prior stroke. Wife reports that he is less steady on his feet and seems to have more difficulty getting about.  Also questionable difficulty with more forgetfulness. Usually not frank confusion but at times "foggy".  Claims compliance with chronic meds no visible at Dr. Lorin Picket this year yet on chronic problems.   Review of Systems No chest pain no abdominal pain ROS otherwise negative    Objective:   Physical Exam  Alert no acute distress pleasant. Asked to remember 3 words could not do this 5 minutes later. HEENT normal. Lungs clear. Heart regular rate and rhythm. Unsteady wide-based gait. Left hand and some cerebellar abnormalities with finger to nose. Rectal exam sphincter definitely wished prostate diffusely and mildly enlarged.      Assessment & Plan:  Impression history of stroke with worsening forgetfulness occasional fogginess unsteady gait. 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 #2 hypertension decent control. #3 rectal laxity long-standing getting worse likely behavioral component to. Plan MRI of the brain. Appropriate blood work. Followup with Dr. Lorin Picket in regards to all these concerns. WSL

## 2012-10-04 LAB — FOLATE: Folate: >20 ng/mL

## 2012-10-04 LAB — LIPID PANEL
HDL: 44 mg/dL (ref >39)
LDL Cholesterol: 108 mg/dL — ABNORMAL HIGH (ref 0–99)
Total CHOL/HDL Ratio: 3.8 Ratio
Triglycerides: 65 mg/dL (ref <150)
VLDL: 13 mg/dL (ref 0–40)

## 2012-10-04 LAB — BASIC METABOLIC PANEL
BUN: 13 mg/dL (ref 6–23)
Potassium: 4.4 mEq/L (ref 3.5–5.3)
Sodium: 143 mEq/L (ref 135–145)

## 2012-10-04 LAB — VITAMIN B12: Vitamin B-12: 740 pg/mL (ref 211–911)

## 2012-10-04 LAB — HEPATIC FUNCTION PANEL
Albumin: 4.1 g/dL (ref 3.5–5.2)
Total Bilirubin: 0.7 mg/dL (ref 0.3–1.2)
Total Protein: 7.1 g/dL (ref 6.0–8.3)

## 2012-10-05 ENCOUNTER — Ambulatory Visit (HOSPITAL_COMMUNITY): Payer: Medicare Other

## 2012-10-05 LAB — PSA: PSA: 1.39 ng/mL (ref <=4.00)

## 2012-10-10 ENCOUNTER — Ambulatory Visit (HOSPITAL_COMMUNITY)
Admission: RE | Admit: 2012-10-10 | Discharge: 2012-10-10 | Disposition: A | Discharge status: Home / Self Care | Payer: Medicare Other | Source: Ambulatory Visit | Attending: Family Medicine | Admitting: Family Medicine

## 2012-10-10 DIAGNOSIS — F05 Delirium due to known physiological condition: Secondary | ICD-10-CM | POA: Insufficient documentation

## 2012-10-10 DIAGNOSIS — I6789 Other cerebrovascular disease: Secondary | ICD-10-CM | POA: Insufficient documentation

## 2012-10-10 DIAGNOSIS — R269 Unspecified abnormalities of gait and mobility: Secondary | ICD-10-CM | POA: Insufficient documentation

## 2012-10-17 ENCOUNTER — Ambulatory Visit (INDEPENDENT_AMBULATORY_CARE_PROVIDER_SITE_OTHER): Payer: Medicare Other | Admitting: Family Medicine

## 2012-10-17 ENCOUNTER — Encounter: Payer: Self-pay | Admitting: Family Medicine

## 2012-10-17 VITALS — BP 122/80 | HR 70 | Wt 158.0 lb

## 2012-10-17 DIAGNOSIS — E785 Hyperlipidemia, unspecified: Secondary | ICD-10-CM

## 2012-10-17 MED ORDER — CITALOPRAM HYDROBROMIDE 20 MG PO TABS: ORAL_TABLET | ORAL | Status: DC

## 2012-10-17 NOTE — Progress Notes (Signed)
  Subjective:    Patient ID: [name], male    DOB: 1946-03-06, 67 y.o.   MRN: [id number]  HPIHere for a follow up on bloodwork and MRI results. No concerns Patient here today followup regarding blood work and MRI. At times his mental status as not good he has a hard time remembering things he tends to get fuzzy off-track other times he seems to be having no particular troubles. Patient does have significant history of mini strokes as well as vascular disease and heart disease. Stable otherwise. Still smokes does not drink compliant with medicine wife tries to help him with his care PMH benign family history benign see previous notes these were reviewed   Review of Systems He denies headaches vomiting bloody stools no fever. No rashes.    Objective:   Physical Exam Lungs are clear hearts regular pulse normal abdomen soft extremities no edema neurologically patient stable. Mini-Mental status exam 27/30       Assessment & Plan:  #1 moderate mental status issues probable cognitive changes related to a hard life and multiple accumulative health problems. Long discussion held regarding this I do not feel any type of memory medicines would be of any help. Continue current measures and see the patient back in one month I do believe there is some element of depression Celexa 10 mg daily followup in one month keep all else as is

## 2012-11-14 ENCOUNTER — Ambulatory Visit (INDEPENDENT_AMBULATORY_CARE_PROVIDER_SITE_OTHER): Payer: Medicare Other | Admitting: Family Medicine

## 2012-11-14 ENCOUNTER — Encounter: Payer: Self-pay | Admitting: Family Medicine

## 2012-11-14 VITALS — BP 132/88 | Ht 75.0 in | Wt 157.0 lb

## 2012-11-14 DIAGNOSIS — I70209 Unspecified atherosclerosis of native arteries of extremities, unspecified extremity: Secondary | ICD-10-CM

## 2012-11-14 MED ORDER — MUPIROCIN 2 % EX OINT: TOPICAL_OINTMENT | CUTANEOUS | Status: DC

## 2012-11-14 NOTE — Patient Instructions (Signed)
Keep lower part of the bed well padded to prevent pressure sores.  Place ointment and bandage on the leg for the next week  If you develop an ulcer than follow up sooner, otherwise in 1 month

## 2012-11-14 NOTE — Progress Notes (Signed)
  Subjective:    Patient ID: [name], male    DOB: 10-20-1945, 67 y.o.   MRN: [id number]  HPIfollow up on starting celexa. Patient states his moods are doing somewhat better. He feels the medicine is helping him. He denies being depressed currently. He does state he gets nervous at times he states he is staying away from alcohol.  Check vein in left leg.  This patient states she has an area on his left leg on the outer ankle that got a little raw and bled. He denies any bleeding from the toes no pain in the leg. Patient has a long history of multiple health problem please see problem list previous notes Family history noncontributory Review of Systems    see above Objective:   Physical Exam  Lungs are clear heart is regular pulse normal the pulses in the feet are diminished area on the left ankle has a small reddened spot no deep ulcer noted.      Assessment & Plan:  Possible small abrasion versus the beginnings of pressure sore-I encouraged him to use good padding at the base of his bed when he sleeps he tends to sleep on his left side. He will followup in 4 weeks to recheck this area  Moods-actually doing better on Celexa continue this.  This patient has already had Doppler studies of the leg which showed diminished flow to both legs. I do not feel that this patient would benefit from further testing right now he does not have active signs of claudication and is quite limited in his activity. He was educated what to watch for in regards to ulcer or infection in followup at problems

## 2012-12-03 ENCOUNTER — Other Ambulatory Visit: Payer: Self-pay | Admitting: Adult Health

## 2012-12-05 ENCOUNTER — Emergency Department (HOSPITAL_COMMUNITY): Payer: Medicare Other

## 2012-12-05 ENCOUNTER — Inpatient Hospital Stay (HOSPITAL_COMMUNITY)
Admission: EM | Admit: 2012-12-05 | Discharge: 2012-12-08 | DRG: 070 | Disposition: A | Discharge status: Home Health | Payer: Medicare Other | Attending: Internal Medicine | Admitting: Internal Medicine

## 2012-12-05 ENCOUNTER — Encounter (HOSPITAL_COMMUNITY): Payer: Self-pay

## 2012-12-05 ENCOUNTER — Ambulatory Visit: Payer: Medicare Other | Admitting: Adult Health

## 2012-12-05 ENCOUNTER — Inpatient Hospital Stay (HOSPITAL_COMMUNITY): Payer: Medicare Other

## 2012-12-05 DIAGNOSIS — N390 Urinary tract infection, site not specified: Secondary | ICD-10-CM

## 2012-12-05 DIAGNOSIS — M25461 Effusion, right knee: Secondary | ICD-10-CM

## 2012-12-05 DIAGNOSIS — IMO0002 Reserved for concepts with insufficient information to code with codable children: Secondary | ICD-10-CM

## 2012-12-05 DIAGNOSIS — D696 Thrombocytopenia, unspecified: Secondary | ICD-10-CM

## 2012-12-05 DIAGNOSIS — D6959 Other secondary thrombocytopenia: Secondary | ICD-10-CM | POA: Diagnosis present

## 2012-12-05 DIAGNOSIS — R4182 Altered mental status, unspecified: Secondary | ICD-10-CM

## 2012-12-05 DIAGNOSIS — J449 Chronic obstructive pulmonary disease, unspecified: Secondary | ICD-10-CM

## 2012-12-05 DIAGNOSIS — F17200 Nicotine dependence, unspecified, uncomplicated: Secondary | ICD-10-CM | POA: Diagnosis present

## 2012-12-05 DIAGNOSIS — Z720 Tobacco use: Secondary | ICD-10-CM

## 2012-12-05 DIAGNOSIS — I130 Hypertensive heart and chronic kidney disease with heart failure and stage 1 through stage 4 chronic kidney disease, or unspecified chronic kidney disease: Secondary | ICD-10-CM | POA: Diagnosis present

## 2012-12-05 DIAGNOSIS — F1010 Alcohol abuse, uncomplicated: Secondary | ICD-10-CM

## 2012-12-05 DIAGNOSIS — N179 Acute kidney failure, unspecified: Secondary | ICD-10-CM

## 2012-12-05 DIAGNOSIS — Z9181 History of falling: Secondary | ICD-10-CM

## 2012-12-05 DIAGNOSIS — I739 Peripheral vascular disease, unspecified: Secondary | ICD-10-CM | POA: Diagnosis present

## 2012-12-05 DIAGNOSIS — E43 Unspecified severe protein-calorie malnutrition: Secondary | ICD-10-CM

## 2012-12-05 DIAGNOSIS — I509 Heart failure, unspecified: Secondary | ICD-10-CM | POA: Diagnosis present

## 2012-12-05 DIAGNOSIS — I69998 Other sequelae following unspecified cerebrovascular disease: Secondary | ICD-10-CM

## 2012-12-05 DIAGNOSIS — J209 Acute bronchitis, unspecified: Secondary | ICD-10-CM

## 2012-12-05 DIAGNOSIS — I10 Essential (primary) hypertension: Secondary | ICD-10-CM

## 2012-12-05 DIAGNOSIS — D369 Benign neoplasm, unspecified site: Secondary | ICD-10-CM

## 2012-12-05 DIAGNOSIS — I679 Cerebrovascular disease, unspecified: Secondary | ICD-10-CM

## 2012-12-05 DIAGNOSIS — J440 Chronic obstructive pulmonary disease with acute lower respiratory infection: Secondary | ICD-10-CM | POA: Diagnosis present

## 2012-12-05 DIAGNOSIS — I714 Abdominal aortic aneurysm, without rupture, unspecified: Secondary | ICD-10-CM

## 2012-12-05 DIAGNOSIS — I501 Left ventricular failure: Secondary | ICD-10-CM

## 2012-12-05 DIAGNOSIS — Z9119 Patient's noncompliance with other medical treatment and regimen: Secondary | ICD-10-CM

## 2012-12-05 DIAGNOSIS — Y92009 Unspecified place in unspecified non-institutional (private) residence as the place of occurrence of the external cause: Secondary | ICD-10-CM

## 2012-12-05 DIAGNOSIS — I672 Cerebral atherosclerosis: Secondary | ICD-10-CM | POA: Diagnosis present

## 2012-12-05 DIAGNOSIS — D649 Anemia, unspecified: Secondary | ICD-10-CM

## 2012-12-05 DIAGNOSIS — I214 Non-ST elevation (NSTEMI) myocardial infarction: Secondary | ICD-10-CM

## 2012-12-05 DIAGNOSIS — W19XXXA Unspecified fall, initial encounter: Secondary | ICD-10-CM

## 2012-12-05 DIAGNOSIS — M1710 Unilateral primary osteoarthritis, unspecified knee: Secondary | ICD-10-CM | POA: Diagnosis present

## 2012-12-05 DIAGNOSIS — I2510 Atherosclerotic heart disease of native coronary artery without angina pectoris: Secondary | ICD-10-CM

## 2012-12-05 DIAGNOSIS — N183 Chronic kidney disease, stage 3 unspecified: Secondary | ICD-10-CM

## 2012-12-05 DIAGNOSIS — G9340 Encephalopathy, unspecified: Principal | ICD-10-CM | POA: Diagnosis present

## 2012-12-05 DIAGNOSIS — I5022 Chronic systolic (congestive) heart failure: Secondary | ICD-10-CM

## 2012-12-05 DIAGNOSIS — J189 Pneumonia, unspecified organism: Secondary | ICD-10-CM

## 2012-12-05 DIAGNOSIS — I2589 Other forms of chronic ischemic heart disease: Secondary | ICD-10-CM | POA: Diagnosis present

## 2012-12-05 DIAGNOSIS — Z91199 Patient's noncompliance with other medical treatment and regimen due to unspecified reason: Secondary | ICD-10-CM

## 2012-12-05 DIAGNOSIS — E876 Hypokalemia: Secondary | ICD-10-CM

## 2012-12-05 DIAGNOSIS — J069 Acute upper respiratory infection, unspecified: Secondary | ICD-10-CM

## 2012-12-05 DIAGNOSIS — I498 Other specified cardiac arrhythmias: Secondary | ICD-10-CM | POA: Diagnosis present

## 2012-12-05 DIAGNOSIS — E860 Dehydration: Secondary | ICD-10-CM | POA: Diagnosis present

## 2012-12-05 DIAGNOSIS — E785 Hyperlipidemia, unspecified: Secondary | ICD-10-CM

## 2012-12-05 DIAGNOSIS — N189 Chronic kidney disease, unspecified: Secondary | ICD-10-CM

## 2012-12-05 DIAGNOSIS — I252 Old myocardial infarction: Secondary | ICD-10-CM

## 2012-12-05 DIAGNOSIS — I119 Hypertensive heart disease without heart failure: Secondary | ICD-10-CM

## 2012-12-05 DIAGNOSIS — Z66 Do not resuscitate: Secondary | ICD-10-CM | POA: Diagnosis present

## 2012-12-05 DIAGNOSIS — F0150 Vascular dementia without behavioral disturbance: Secondary | ICD-10-CM

## 2012-12-05 HISTORY — DX: Effusion, right knee: M25.461

## 2012-12-05 LAB — CBC WITH DIFFERENTIAL/PLATELET
HCT: 36.1 % — ABNORMAL LOW (ref 39.0–52.0)
Hemoglobin: 12.6 g/dL — ABNORMAL LOW (ref 13.0–17.0)
Lymphocytes Relative: 16 % (ref 12–46)
MCHC: 34.9 g/dL (ref 30.0–36.0)
Monocytes Absolute: 1.7 10*3/uL — ABNORMAL HIGH (ref 0.1–1.0)
Monocytes Relative: 16 % — ABNORMAL HIGH (ref 3–12)
Neutro Abs: 7.1 10*3/uL (ref 1.7–7.7)
WBC: 10.6 10*3/uL — ABNORMAL HIGH (ref 4.0–10.5)

## 2012-12-05 LAB — URINALYSIS, ROUTINE W REFLEX MICROSCOPIC
Glucose, UA: NEGATIVE mg/dL
Ketones, ur: NEGATIVE mg/dL
Leukocytes, UA: NEGATIVE
pH: 5.5 (ref 5.0–8.0)

## 2012-12-05 LAB — BASIC METABOLIC PANEL
BUN: 25 mg/dL — ABNORMAL HIGH (ref 6–23)
CO2: 22 mEq/L (ref 19–32)
Chloride: 102 mEq/L (ref 96–112)
Creatinine, Ser: 2.04 mg/dL — ABNORMAL HIGH (ref 0.50–1.35)
Glucose, Bld: 91 mg/dL (ref 70–99)

## 2012-12-05 LAB — URINE MICROSCOPIC-ADD ON

## 2012-12-05 MED ORDER — NITROGLYCERIN 0.4 MG SL SUBL: 0.4000 mg | SUBLINGUAL_TABLET | SUBLINGUAL | Status: DC | PRN

## 2012-12-05 MED ORDER — CITALOPRAM HYDROBROMIDE 20 MG PO TABS
10.0000 mg | ORAL_TABLET | Freq: Every day | ORAL | Status: DC
  Administered 2012-12-06 – 2012-12-08 (×3): 10 mg via ORAL
  Filled 2012-12-05 (×6): qty 1

## 2012-12-05 MED ORDER — CEFTRIAXONE SODIUM 1 G IJ SOLR
1.0000 g | Freq: Once | INTRAMUSCULAR | Status: AC
  Administered 2012-12-05: 1 g via INTRAVENOUS
  Filled 2012-12-05: qty 10

## 2012-12-05 MED ORDER — DEXTROSE 5 % IV SOLN
1.0000 g | INTRAVENOUS | Status: DC
  Administered 2012-12-06 – 2012-12-07 (×2): 1 g via INTRAVENOUS
  Filled 2012-12-05 (×4): qty 10

## 2012-12-05 MED ORDER — ADULT MULTIVITAMIN W/MINERALS CH
1.0000 | ORAL_TABLET | Freq: Every day | ORAL | Status: DC
  Administered 2012-12-06 – 2012-12-08 (×3): 1 via ORAL
  Filled 2012-12-05 (×4): qty 1

## 2012-12-05 MED ORDER — ONDANSETRON HCL 4 MG PO TABS: 4.0000 mg | ORAL_TABLET | Freq: Four times a day (QID) | ORAL | Status: DC | PRN

## 2012-12-05 MED ORDER — DEXTROSE 5 % IV SOLN: 1.0000 g | INTRAVENOUS | Status: DC

## 2012-12-05 MED ORDER — SODIUM CHLORIDE 0.9 % IJ SOLN
3.0000 mL | Freq: Two times a day (BID) | INTRAMUSCULAR | Status: DC
  Administered 2012-12-05 – 2012-12-07 (×4): 3 mL via INTRAVENOUS

## 2012-12-05 MED ORDER — CITALOPRAM HYDROBROMIDE 20 MG PO TABS
ORAL_TABLET | ORAL | Status: AC
  Filled 2012-12-05: qty 1

## 2012-12-05 MED ORDER — DEXTROSE 5 % IV SOLN
500.0000 mg | Freq: Once | INTRAVENOUS | Status: AC
  Administered 2012-12-05: 500 mg via INTRAVENOUS
  Filled 2012-12-05: qty 500

## 2012-12-05 MED ORDER — SODIUM CHLORIDE 0.9 % IV SOLN
INTRAVENOUS | Status: DC
  Administered 2012-12-06: 18:00:00 via INTRAVENOUS

## 2012-12-05 MED ORDER — ONDANSETRON HCL 4 MG/2ML IJ SOLN: 4.0000 mg | Freq: Four times a day (QID) | INTRAMUSCULAR | Status: DC | PRN

## 2012-12-05 MED ORDER — DEXTROSE 5 % IV SOLN: 500.0000 mg | INTRAVENOUS | Status: DC

## 2012-12-05 MED ORDER — DEXTROSE 5 % IV SOLN
500.0000 mg | INTRAVENOUS | Status: DC
  Administered 2012-12-06 – 2012-12-07 (×2): 500 mg via INTRAVENOUS
  Filled 2012-12-05 (×4): qty 500

## 2012-12-05 MED ORDER — HEPARIN SODIUM (PORCINE) 5000 UNIT/ML IJ SOLN
5000.0000 [IU] | Freq: Three times a day (TID) | INTRAMUSCULAR | Status: DC
  Administered 2012-12-05 – 2012-12-08 (×8): 5000 [IU] via SUBCUTANEOUS
  Filled 2012-12-05 (×8): qty 1

## 2012-12-05 MED ORDER — CARVEDILOL 3.125 MG PO TABS
6.2500 mg | ORAL_TABLET | Freq: Two times a day (BID) | ORAL | Status: DC
Start: 2012-12-06 — End: 2012-12-08
  Administered 2012-12-06 – 2012-12-08 (×5): 6.25 mg via ORAL
  Filled 2012-12-05 (×2): qty 2
  Filled 2012-12-05: qty 1
  Filled 2012-12-05 (×3): qty 2
  Filled 2012-12-05 (×2): qty 1

## 2012-12-05 MED ORDER — ATORVASTATIN CALCIUM 40 MG PO TABS
80.0000 mg | ORAL_TABLET | Freq: Every day | ORAL | Status: DC
  Administered 2012-12-06 – 2012-12-07 (×2): 80 mg via ORAL
  Filled 2012-12-05 (×2): qty 2
  Filled 2012-12-05: qty 1

## 2012-12-05 MED ORDER — AMLODIPINE BESYLATE 5 MG PO TABS
5.0000 mg | ORAL_TABLET | Freq: Every day | ORAL | Status: DC
  Administered 2012-12-06 – 2012-12-08 (×3): 5 mg via ORAL
  Filled 2012-12-05 (×4): qty 1

## 2012-12-05 MED ORDER — TICAGRELOR 90 MG PO TABS
90.0000 mg | ORAL_TABLET | Freq: Two times a day (BID) | ORAL | Status: DC
  Administered 2012-12-05 – 2012-12-08 (×6): 90 mg via ORAL
  Filled 2012-12-05 (×10): qty 1

## 2012-12-05 MED ORDER — COLESEVELAM HCL 625 MG PO TABS
625.0000 mg | ORAL_TABLET | Freq: Two times a day (BID) | ORAL | Status: DC
  Administered 2012-12-06 – 2012-12-08 (×5): 625 mg via ORAL
  Filled 2012-12-05 (×12): qty 1

## 2012-12-05 MED ORDER — ALBUTEROL SULFATE (5 MG/ML) 0.5% IN NEBU: 2.5000 mg | INHALATION_SOLUTION | RESPIRATORY_TRACT | Status: AC | PRN

## 2012-12-05 NOTE — H&P (Signed)
[facility] History and Physical  [name] ZOX:[id number] DOB: 1946-01-20 DOA: 12/05/2012  Referring physician: ER. PCP: [name], MD    Chief Complaint: Altered mental status.  HPI: [name] is a 67 y.o. male who presents to the hospital with a four-day history of altered mental status associated with falls. He has had 2 falls, one 4 days ago and one today. On both occasions, his wife did not actually see him fall but found him on the floor of his bedroom. On the second occasion, he was able to get up with some help. On the first occasion his wife needed to pull him up using all her strength. She also describes that he has had a cough, nonproductive and also has felt warm as if he had a fever. The wife also describes almost a 23 year history of "mini strokes" and describes that he has had memory problems for the last 3 years. The patient requires help with bathing, dressing. His right leg is weak and drags and this is a more chronic problem. He is apparently able to feed himself. Over the last 3-4 days, he is a very poor intake, especially fluids. He has a history of chronic kidney disease, hypertension, coronary artery disease with cardiomyopathy, ejection fraction 30-40%. He suffered a myocardial infarction, non-ST elevation, in the middle of July last year. He was managed at that time with bare-metal stent to a critical proximal LAD lesion.    Review of Systems:   Apart from history of present illness, other systems negative.  Past Medical History  Diagnosis Date  . Hypertension   . COPD (chronic obstructive pulmonary disease)   . Hyperlipidemia   . Cardiomyopathy     EF of 30% per echo in June of 2012; admitted with congestive heart failure; nonobstructive CAD on cath in 08/2010  . History of noncompliance with medical treatment   . Cerebrovascular disease 2011    CVA  in 02/2010-only deficit is decreased vision in  right eye  . Alcohol abuse   . Tobacco abuse      40 pack years  . Abdominal aortic aneurysm     Stent graft repair  . Stroke 2011    decreased vision of right eye  . Chronic systolic CHF (congestive heart failure)   . Leg pain   . CKD (chronic kidney disease) stage 3, GFR 30-59 ml/min     creatinine-1.7 in 08/2010  . CAD (coronary artery disease)   . Myocardial infarction acute 10/01/11   Past Surgical History  Procedure Laterality Date  . Colonoscopy w/ polypectomy  2006    Dr. Katrinka Blazing: anal fissure, sessile adenomatous polyp, diverticulosis  . Esophagogastroduodenoscopy  11/15/2010    Procedure: ESOPHAGOGASTRODUODENOSCOPY (EGD);  Surgeon: Arlyce Harman, MD;  Location: [facility];  Service: Endoscopy;  Laterality: N/A;  with propofol sedation; procedure start @ 0813  . Flexible sigmoidoscopy  11/15/2010    Procedure: FLEXIBLE SIGMOIDOSCOPY;  Surgeon: Arlyce Harman, MD;  Location: [facility];  Service: Endoscopy;  Laterality: N/A;  with propofol sedation; ended @ 0808  . Polypectomy  12/13/2010    Procedure: POLYPECTOMY;  Surgeon: Arlyce Harman, MD;  Location: [facility];  Service: Endoscopy;  Laterality: N/A;  right colon, cecal, transverse, and sigmoid polypectomy  . Cardiac catheterization  10/04/11    Normal left main, 95% pLAD stenosis with ulceration s/p successful BMS placement, 30% segmental plaque beyond this, dLAD after D2 with 50% plaquing, then focal 70% lesion, 80% focal short  D2 stenosis, 30% pOM2 lesion, 30-40% mOM3 stenosis, Shephard's crook region of RCA with 50% lesion, mRCA 50-60% lesion and mild dRCA irregularities.   . Coronary stent placement  10/04/11  . Abdominal aortic aneurysm repair w/ endoluminal graft      01/16/2008   Social History:  reports that he has been smoking Cigarettes.  He has a 25 pack-year smoking history. He has never used smokeless tobacco. He reports that he drinks about 16.8 ounces of alcohol per week. He reports that he does not use illicit drugs.   No Known Allergies  Family History  Problem  Relation Age of Onset  . Colon cancer Neg Hx   . Anesthesia problems Neg Hx   . Hypotension Neg Hx   . Malignant hyperthermia Neg Hx   . Pseudochol deficiency Neg Hx   . Cancer Mother   . Heart disease Mother     before age 14  . Hyperlipidemia Mother   . Hypertension Mother      Prior to Admission medications   Medication Sig Start Date End Date Taking? Authorizing Provider  amLODipine (NORVASC) 5 MG tablet TAKE 1 TABLET EVERY DAY 06/30/12  Yes Jodelle Gross, NP  atorvastatin (LIPITOR) 80 MG tablet TAKE 1 TABLET BY MOUTH DAILY AT 6 PM. 06/30/12  Yes Jodelle Gross, NP  BRILINTA 90 MG TABS tablet TAKE 1 TABLET TWICE A DAY 12/03/12  Yes Jodelle Gross, NP  carvedilol (COREG) 6.25 MG tablet TAKE 1 TABLET TWICE A DAY WITH A MEAL 06/30/12  Yes Jodelle Gross, NP  citalopram (CELEXA) 20 MG tablet 1/2 tablet daily 10/17/12 10/18/14 Yes Babs Sciara, MD  Multiple Vitamin (MULTIVITAMIN WITH MINERALS) TABS Take 1 tablet by mouth daily.   Yes Historical Provider, MD  nitroGLYCERIN (NITROSTAT) 0.4 MG SL tablet Place 0.4 mg under the tongue every 5 (five) minutes as needed for chest pain.   Yes Historical Provider, MD  WELCHOL 625 MG tablet TAKE 2 TABLETS BY MOUTH 2 TIMES DAILY. 12/03/12  Yes Jodelle Gross, NP   Physical Exam: Filed Vitals:   12/05/12 1926  BP: 159/75  Pulse: 63  Temp:   Resp: 18     General:   he looks clinically dehydrated. Is not toxic or septic. He is afebrile in the emergency room.  Eyes:  no pallor. No jaundice.  ENT:  no major abnormalities.  Neck:  jugular venous pressure not raised. No lymphadenopathy.   Cardiovascular:  heart sounds appear to be in sinus rhythm without gallop rhythm. There are no murmurs.   Respiratory:  lung fields are essentially clear with just a few crackles more in the left than the right base. There is no bronchial breathing or wheezing.   Abdomen:  soft, nontender. No hepatosplenomegaly.  Skin:  no rash.    Musculoskeletal:  no acute joint problems.   Psychiatric:  appropriate affect.   Neurologic:  right leg is weaker than the left. Both arms are equal and normal strength. There is no facial drooping. His speech is normal. He is alert and orientated in time, place and person.   Labs on Admission:  Basic Metabolic Panel:  Recent Labs Lab 12/05/12 1345  NA 135  K 4.6  CL 102  CO2 22  GLUCOSE 91  BUN 25*  CREATININE 2.04*  CALCIUM 9.9       CBC:  Recent Labs Lab 12/05/12 1345  WBC 10.6*  NEUTROABS 7.1  HGB 12.6*  HCT 36.1*  MCV 93.0  PLT 123*   Cardiac Enzymes:  Recent Labs Lab 12/05/12 1344  TROPONINI <0.30       Radiological Exams on Admission: Dg Chest 1 View  12/05/2012   CLINICAL DATA:  Altered mental status. Weakness.  EXAM: CHEST - 1 VIEW  COMPARISON:  CHEST x-ray 10/03/2011.  FINDINGS: Lung volumes are low. Patchy multifocal interstitial and very ill-defined vague airspace opacities are noted throughout the lungs bilaterally, most pronounced in the mid to lower lungs. Mild diffuse peribronchial cuffing. No pleural effusions. No evidence of pulmonary edema. Heart size appears borderline enlarged. Mediastinal contours are unremarkable.  IMPRESSION: 1. The appearance the chest suggests bronchitis with potential early bronchopneumonia in the mid to lower lungs bilaterally.   Electronically Signed   By: Trudie Reed M.D.   On: 12/05/2012 17:32   Dg Elbow Complete Right  12/05/2012   CLINICAL DATA:  Status post fall x2. Right elbow pain and swelling.  EXAM: RIGHT ELBOW - COMPLETE 3+ VIEW  COMPARISON:  None.  FINDINGS: There is soft tissue swelling over the dorsum of the elbow compatible with olecranon bursitis, possibly hemorrhagic given history of fall. No fracture or dislocation is identified. No elbow joint effusion is seen. The patient has degenerative change about the elbow with osteophytosis most notable off the coronoid process of the ulna. Small  enthesophyte at the triceps tendon insertion is noted.  IMPRESSION: 1. Posterior soft tissue swelling compatible with olecranon bursitis, possibly hemorrhagic given history of fall. 2. Negative for fracture. 3. Degenerative change about the elbow.   Electronically Signed   By: Drusilla Kanner M.D.   On: 12/05/2012 14:27   Ct Head Wo Contrast  12/05/2012   CLINICAL DATA:  67 year old male with fall. Altered mental status. History of stroke.  EXAM: CT HEAD WITHOUT CONTRAST  TECHNIQUE: Contiguous axial images were obtained from the base of the skull through the vertex without intravenous contrast.  COMPARISON:  Brain MRI 10/10/2012 and earlier.  FINDINGS: Visualized paranasal sinuses and mastoids are clear. No acute osseous abnormality identified. Visualized orbits and scalp soft tissues are within normal limits.  Calcified atherosclerosis at the skull base. Widespread chronic deep gray matter nuclei lacunar infarcts re- identified. As noted on 10/10/2012 , the changes have progressed since 2012. There is also moderate to severe brainstem and cerebellar involvement.  No ventriculomegaly. No midline shift, mass effect, or evidence of intracranial mass lesion. No acute intracranial hemorrhage identified. Patchy in confluent cerebral white matter hypodensity has not significantly changed since 2012. No evidence of cortically based acute infarction identified. No suspicious intracranial vascular hyperdensity.  IMPRESSION: Advanced chronic small vessel ischemia. No acute intracranial abnormality.   Electronically Signed   By: Augusto Gamble M.D.   On: 12/05/2012 18:17   Dg Knee Complete 4 Views Right  12/05/2012   CLINICAL DATA:  Status post fall x2. Right knee pain and swelling.  EXAM: RIGHT KNEE - COMPLETE 4+ VIEW  COMPARISON:  None.  FINDINGS: The patient has a moderate to large knee joint effusion. No fracture is identified. There is degenerative change about the knee with joint space loss and osteophytosis appearing  worst in the medial and patellofemoral compartments. Several rounded calcifications are seen projecting over the posterior aspect of the joint and are most consistent with loose bodies within a Baker's cyst.  IMPRESSION: 1. Negative for fracture. 2. 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 3. Moderate to large knee joint effusion. 4. Calcifications in the posterior aspect of the knee are  most consistent with loose bodies within a Baker's cyst.   Electronically Signed   By: Drusilla Kanner M.D.   On: 12/05/2012 14:25    EKG: Independently reviewed.  normal sinus rhythm, left ventricular hypertrophy. No acute ST-T wave changes.   Assessment/Plan Active Problems:   Altered mental status   Hypertensive heart disease without CHF   Chronic kidney disease   Cerebrovascular disease   CAD (coronary artery disease)   Vascular dementia   1. Altered mental status, multifactorial. I think he is clinically dehydrated, likely has had small CVA/TIA and may also have community-acquired pneumonia. This would be on the background of vascular dementia. 2. Acute on chronic kidney disease secondary to dehydration. 3. Probable CVA. 4. Vascular dementia. 5. Hypertension. 6. Ischemic cardiomyopathy, ejection fraction 30-40%, not clinically in heart failure at the present time. 7. Possible community-acquired pneumonia based on chest x-ray findings. His clinical symptoms and not very impressive, however. 8. Hyperlipidemia.   Plan: 1. Admit to telemetry. 2. Gentle intravenous fluid hydration in view of his ischemic cardiomyopathy and systolic dysfunction. 3. Intravenous antibiotics for possible community-acquired pneumonia. 4. Physical therapy evaluation. 5. MRI brain scan. 6. Speech language pathology evaluation for swallowing. I'm concerned about the patient's safety with recent falls and I'm not convinced he can go home upon discharge from the hospital. I think he might  then if it from skilled nursing facility placement. Physical therapy evaluation will be critical.   Code Status:  DO NOT RESUSCITATE. This was confirmed by the patient and his wife.    Family Communication:  discussed the plan with patient and wife at the bedside.   Disposition Plan:  depending on progress. He may need placement to a skilled nursing facility.    Time spent:  60 minutes.   Wilson Singer [facility] Pager [phone].   If 7PM-7AM, please contact night-coverage [email or web address] Password [email or web address] 12/05/2012, 7:43 PM

## 2012-12-05 NOTE — ED Provider Notes (Signed)
CSN: [id number]     Arrival date & time 12/05/12  1125 History  This chart was scribed for Candyce Churn, MD by Quintella Reichert, ED scribe.  This patient was seen in room APA16A/APA16A and the patient's care was started at 1:22 PM.   Chief Complaint  Patient presents with  . Fall  . Altered Mental Status    Patient is a 67 y.o. male presenting with fall and altered mental status. The history is provided by the patient and the spouse. No language interpreter was used.  Fall This is a recurrent problem. The current episode started more than 2 days ago. The problem occurs every several days. The problem has been gradually worsening. Pertinent negatives include no chest pain, no headaches and no shortness of breath. Associated symptoms comments: Right knee pain, right elbow pain.  Altered Mental Status Presenting symptoms: confusion   Presenting symptoms comment:  Confusion, generalized weakness Severity:  Moderate Most recent episode:  Today Episode history:  Continuous Duration: gradually worsening for several months but worsening over the past several days. Timing:  Constant Progression:  Worsening Context comment:  Multiple TIAs Associated symptoms: weakness   Associated symptoms: no difficulty breathing, no fever, no headaches and no vomiting   Associated symptoms comment:  Urinary frequency   HPI Comments: [name] is a 67 y.o. male with h/o stroke, MI, CHF, peripheral vascular disease, COPD, CKD and HTN brought in by wife to the Emergency Department complaining of multiple falls over the past several days with associated increasing confusion and generalized weakness.  Wife reports that pt has had multiple TIAs and has often been "a little fuzzy and wobbly."  This "fogginess" has been gradually worsening for several months but he did not begin falling until recently.  On Sunday he fell and has been very inactive since then.  He again fell last night and wife found him on  the floor and states that he was unable to rise without assistance.  She states that since then his weakness has been greatly exacerbated and this morning he was difficult to wake up.  Presently pt complains of moderate-to-severe pain to his right elbow and right knee.  He attributes his difficulty ambulating to his knee pain.  Wife also reports that pt is using his urinal more frequently than usual and has been producing a small amount of dark urine each time.  Pt denies CP, SOB, vomiting, headaches or fevers.  He states he has an appointment with his cardiologist today to f/u for a bypass surgery in 2013.    PCP is Dr. Luking Cardiologist is Dr. Rothbart   Past Medical History  Diagnosis Date  . Hypertension   . COPD (chronic obstructive pulmonary disease)   . Hyperlipidemia   . Cardiomyopathy     EF of 30% per echo in June of 2012; admitted with congestive heart failure; nonobstructive CAD on cath in 08/2010  . History of noncompliance with medical treatment   . Cerebrovascular disease 2011    CVA  in 02/2010-only deficit is decreased vision in  right eye  . Alcohol abuse   . Tobacco abuse     40  pack years  . Abdominal aortic aneurysm     Stent graft repair  . Stroke 2011    decreased vision of right eye  . Chronic systolic CHF (congestive heart failure)   . Leg pain   . CKD (chronic kidney disease) stage 3, GFR 30-59 ml/min     creatinine-1.7  in 08/2010  . CAD (coronary artery disease)   . Myocardial infarction acute 10/01/11    Past Surgical History  Procedure Laterality Date  . Colonoscopy w/ polypectomy  2006    Dr. Katrinka Blazing: anal fissure, sessile adenomatous polyp, diverticulosis  . Esophagogastroduodenoscopy  11/15/2010    Procedure: ESOPHAGOGASTRODUODENOSCOPY (EGD);  Surgeon: Arlyce Harman, MD;  Location: [facility];  Service: Endoscopy;  Laterality: N/A;  with propofol sedation; procedure start @ 0813  . Flexible sigmoidoscopy  11/15/2010    Procedure: FLEXIBLE SIGMOIDOSCOPY;   Surgeon: Arlyce Harman, MD;  Location: [facility];  Service: Endoscopy;  Laterality: N/A;  with propofol sedation; ended @ 0808  . Polypectomy  12/13/2010    Procedure: POLYPECTOMY;  Surgeon: Arlyce Harman, MD;  Location: [facility];  Service: Endoscopy;  Laterality: N/A;  right colon, cecal, transverse, and sigmoid polypectomy  . Cardiac catheterization  10/04/11    Normal left main, 95% pLAD stenosis with ulceration s/p successful BMS placement, 30% segmental plaque beyond this, dLAD after D2 with 50% plaquing, then focal 70% lesion, 80% focal short D2 stenosis, 30% pOM2 lesion, 30-40% mOM3 stenosis, Shephard's crook region of RCA with 50% lesion, mRCA 50-60% lesion and mild dRCA irregularities.   . Coronary stent placement  10/04/11  . Abdominal aortic aneurysm repair w/ endoluminal graft      01/16/2008    Family History  Problem Relation Age of Onset  . Colon cancer Neg Hx   . Anesthesia problems Neg Hx   . Hypotension Neg Hx   . Malignant hyperthermia Neg Hx   . Pseudochol deficiency Neg Hx   . Cancer Mother   . Heart disease Mother     before age 61  . Hyperlipidemia Mother   . Hypertension Mother     History  Substance Use Topics  . Smoking status: Current Every Day Smoker -- 0.50 packs/day for 50 years    Types: Cigarettes  . Smokeless tobacco: Never Used  . Alcohol Use: 16.8 oz/week    28 Shots of liquor per week     Comment: quit about 1 month ago, prior to this would drink about 7 oz of liquor per day 10/2012     Review of Systems  Constitutional: Negative for fever.  Respiratory: Negative for shortness of breath.   Cardiovascular: Negative for chest pain.  Gastrointestinal: Negative for vomiting.  Genitourinary: Positive for frequency.  Neurological: Positive for weakness. Negative for headaches.  Psychiatric/Behavioral: Positive for confusion.  All other systems reviewed and are negative.     Allergies  Review of patient's allergies indicates no known  allergies.  Home Medications   Current Outpatient Rx  Name  Route  Sig  Dispense  Refill  . amLODipine (NORVASC) 5 MG tablet      TAKE 1 TABLET EVERY DAY   30 tablet   3   . atorvastatin (LIPITOR) 80 MG tablet      TAKE 1 TABLET BY MOUTH DAILY AT 6 PM.   30 tablet   3   . BRILINTA 90 MG TABS tablet      TAKE 1 TABLET TWICE A DAY   60 tablet   3   . carvedilol (COREG) 6.25 MG tablet      TAKE 1 TABLET TWICE A DAY WITH A MEAL   60 tablet   3   . citalopram (CELEXA) 20 MG tablet      1/2 tablet daily   15 tablet   2   .  Multiple Vitamin (MULTIVITAMIN WITH MINERALS) TABS   Oral   Take 1 tablet by mouth daily.         . nitroGLYCERIN (NITROSTAT) 0.4 MG SL tablet   Sublingual   Place 0.4 mg under the tongue every 5 (five) minutes as needed for chest pain.         . WELCHOL 625 MG tablet      TAKE 2 TABLETS BY MOUTH 2 TIMES DAILY.   120 tablet   3    BP 125/59  Pulse 66  Temp(Src) 99.5 F (37.5 C) (Oral)  Resp 20  Ht 6\' 3"  (1.905 m)  Wt 158 lb (71.668 kg)  BMI 19.75 kg/m2  SpO2 100%  Physical Exam  Nursing note and vitals reviewed. Constitutional: He is oriented to person, place, and time. He appears well-developed and well-nourished. No distress.  Generalized weakness  HENT:  Head: Normocephalic and atraumatic.  Mouth/Throat: Oropharynx is clear and moist.  Eyes: Conjunctivae are normal. Pupils are equal, round, and reactive to light. No scleral icterus.  Neck: Neck supple.  Cardiovascular: Normal rate, regular rhythm, normal heart sounds and intact distal pulses.   No murmur heard. Pulmonary/Chest: Effort normal and breath sounds normal. No stridor. No respiratory distress. He has no wheezes. He has no rales.  Abdominal: Soft. He exhibits no distension. There is no tenderness.  Musculoskeletal: Normal range of motion. He exhibits tenderness. He exhibits no edema.       Right elbow: He exhibits swelling. He exhibits normal range of motion, no  deformity and no laceration. Tenderness found.       Right hip: He exhibits normal range of motion and no tenderness.       Left hip: He exhibits normal range of motion and no tenderness.       Right knee: He exhibits normal range of motion, no swelling, no deformity and no laceration. Tenderness found.  Right knee tender, no significant swelling or decreased ROM Right elbow swelling, mildly tender  Neurological: He is alert and oriented to person, place, and time. No cranial nerve deficit or sensory deficit. GCS eye subscore is 4. GCS verbal subscore is 5. GCS motor subscore is 6.  Skin: Skin is warm and dry. No rash noted.  Psychiatric: He has a normal mood and affect. His behavior is normal.    ED Course  Procedures (including critical care time)  DIAGNOSTIC STUDIES: Oxygen Saturation is 100% on room air, normal by my interpretation.    COORDINATION OF CARE: 1:54 PM: Discussed treatment plan which includes EKG, imaging and labs.  Pt and wife expressed understanding and agreed to plan.   Labs Review Labs Reviewed  URINALYSIS, ROUTINE W REFLEX MICROSCOPIC - Abnormal; Notable for the following:    Specific Gravity, Urine >1.030 (*)    Hgb urine dipstick MODERATE (*)    Protein, ur >300 (*)    All other components within normal limits  CBC WITH DIFFERENTIAL - Abnormal; Notable for the following:    WBC 10.6 (*)    RBC 3.88 (*)    Hemoglobin 12.6 (*)    HCT 36.1 (*)    Platelets 123 (*)    Monocytes Relative 16 (*)    Monocytes Absolute 1.7 (*)    All other components within normal limits  BASIC METABOLIC PANEL - Abnormal; Notable for the following:    BUN 25 (*)    Creatinine, Ser 2.04 (*)    GFR calc non Af Amer 32 (*)  GFR calc Af Amer 37 (*)    All other components within normal limits  URINE MICROSCOPIC-ADD ON - Abnormal; Notable for the following:    Squamous Epithelial / LPF FEW (*)    Bacteria, UA FEW (*)    Casts GRANULAR CAST (*)    All other components within  normal limits  URINE CULTURE  TROPONIN I    EKG - NSR, rate 62, normal axis, normal intervals, LVH with repolarization abnormality, t wave changes improved compared to prior.    Imaging Review Dg Chest 1 View  12/05/2012   CLINICAL DATA:  Altered mental status. Weakness.  EXAM: CHEST - 1 VIEW  COMPARISON:  CHEST x-ray 10/03/2011.  FINDINGS: Lung volumes are low. Patchy multifocal interstitial and very ill-defined vague airspace opacities are noted throughout the lungs bilaterally, most pronounced in the mid to lower lungs. Mild diffuse peribronchial cuffing. No pleural effusions. No evidence of pulmonary edema. Heart size appears borderline enlarged. Mediastinal contours are unremarkable.  IMPRESSION: 1. The appearance the chest suggests bronchitis with potential early bronchopneumonia in the mid to lower lungs bilaterally.   Electronically Signed   By: Trudie Reed M.D.   On: 12/05/2012 17:32   Dg Elbow Complete Right  12/05/2012   CLINICAL DATA:  Status post fall x2. Right elbow pain and swelling.  EXAM: RIGHT ELBOW - COMPLETE 3+ VIEW  COMPARISON:  None.  FINDINGS: There is soft tissue swelling over the dorsum of the elbow compatible with olecranon bursitis, possibly hemorrhagic given history of fall. No fracture or dislocation is identified. No elbow joint effusion is seen. The patient has degenerative change about the elbow with osteophytosis most notable off the coronoid process of the ulna. Small enthesophyte at the triceps tendon insertion is noted.  IMPRESSION: 1. Posterior soft tissue swelling compatible with olecranon bursitis, possibly hemorrhagic given history of fall. 2. Negative for fracture. 3. Degenerative change about the elbow.   Electronically Signed   By: Drusilla Kanner M.D.   On: 12/05/2012 14:27   Dg Knee Complete 4 Views Right  12/05/2012   CLINICAL DATA:  Status post fall x2. Right knee pain and swelling.  EXAM: RIGHT KNEE - COMPLETE 4+ VIEW  COMPARISON:  None.  FINDINGS:  The patient has a moderate to large knee joint effusion. No fracture is identified. There is degenerative change about the knee with joint space loss and osteophytosis appearing worst in the medial and patellofemoral compartments. Several rounded calcifications are seen projecting over the posterior aspect of the joint and are most consistent with loose bodies within a Baker's cyst.  IMPRESSION: 1. Negative for fracture. 2. 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 3. Moderate to large knee joint effusion. 4. Calcifications in the posterior aspect of the knee are most consistent with loose bodies within a Baker's cyst.   Electronically Signed   By: Drusilla Kanner M.D.   On: 12/05/2012 14:25   All radiology studies independently viewed by me.       MDM  No diagnosis found. 67 yo male with weakness and multiple recent falls.  Alert and oriented in ED, but wife complains that he's had some confusion earlier today, possibly representing waxing and waning delirium.  Workup for traumatic injuries negative.  Initial lab workup shows mild elevation in creatinine (wife endorses chronic renal insufficiency, worse since July).  CXR and head CT pending at time care transferred to Dr. Criss Alvine.      I personally performed the services described in  this documentation, which was scribed in my presence. The recorded information has been reviewed and is accurate.     Candyce Churn, MD 12/05/12 [phone]

## 2012-12-05 NOTE — ED Provider Notes (Signed)
6:20 PM Care transferred to me. Patient found to have bronchitis with likely PNA on CXR. Given his waxing and waning confusion, cough and elevated WBC will admit for PNA for IV abx. No hosp admission in over 1 year.  Audree Camel, MD 12/05/12 [phone]

## 2012-12-05 NOTE — ED Notes (Signed)
Pt presents with spouse at his side as historian. Pt fell Sunday evening, c/o rt knee pain and rt elbow " soreness". Mild swelling noted to both sites. Full ROM noted in both extremities. Wife reports c/o increased pain in rt knee with weight bearing. Spouse also reports pt's increasing " slowness and periods of confusion over past 3-4 days". Also decreased urine output with increase in frequency, decreased appetite and ability to care for self.  Pt has history of stroke, multiple TIA's and cardiac bypass surgery within the past year. Pt is noted alert, oriented x 4. Side rails up per pt safety.

## 2012-12-05 NOTE — ED Notes (Signed)
Pt brought to the ed by his wife today and stated that he has been weak for several weeks, fell on Sunday and again last night, when he fell Sunday was walking to the bathroom and cut foot, was found "sitting on his bottom", denies any loc, was able to help his family get himself up, fell last night, both sides of his body seem weaker bper his wife, his wife thinks that "he is a little fuzzy".  Not remembering details well, and unable to care for himself.

## 2012-12-06 ENCOUNTER — Encounter (HOSPITAL_COMMUNITY): Payer: Self-pay | Admitting: Internal Medicine

## 2012-12-06 ENCOUNTER — Inpatient Hospital Stay (HOSPITAL_COMMUNITY): Payer: Medicare Other

## 2012-12-06 DIAGNOSIS — J209 Acute bronchitis, unspecified: Secondary | ICD-10-CM | POA: Diagnosis present

## 2012-12-06 DIAGNOSIS — M25469 Effusion, unspecified knee: Secondary | ICD-10-CM

## 2012-12-06 DIAGNOSIS — Y92009 Unspecified place in unspecified non-institutional (private) residence as the place of occurrence of the external cause: Secondary | ICD-10-CM

## 2012-12-06 DIAGNOSIS — M25461 Effusion, right knee: Secondary | ICD-10-CM | POA: Diagnosis present

## 2012-12-06 DIAGNOSIS — W19XXXA Unspecified fall, initial encounter: Secondary | ICD-10-CM

## 2012-12-06 DIAGNOSIS — N179 Acute kidney failure, unspecified: Secondary | ICD-10-CM | POA: Diagnosis present

## 2012-12-06 DIAGNOSIS — D696 Thrombocytopenia, unspecified: Secondary | ICD-10-CM | POA: Diagnosis present

## 2012-12-06 DIAGNOSIS — I10 Essential (primary) hypertension: Secondary | ICD-10-CM | POA: Diagnosis present

## 2012-12-06 DIAGNOSIS — N390 Urinary tract infection, site not specified: Secondary | ICD-10-CM | POA: Diagnosis present

## 2012-12-06 DIAGNOSIS — E43 Unspecified severe protein-calorie malnutrition: Secondary | ICD-10-CM | POA: Diagnosis present

## 2012-12-06 HISTORY — DX: Effusion, right knee: M25.461

## 2012-12-06 LAB — COMPREHENSIVE METABOLIC PANEL
ALT: 15 U/L (ref 0–53)
AST: 40 U/L — ABNORMAL HIGH (ref 0–37)
Albumin: 3 g/dL — ABNORMAL LOW (ref 3.5–5.2)
Alkaline Phosphatase: 86 U/L (ref 39–117)
Potassium: 4 mEq/L (ref 3.5–5.1)
Sodium: 135 mEq/L (ref 135–145)
Total Protein: 6.6 g/dL (ref 6.0–8.3)

## 2012-12-06 LAB — CBC
HCT: 33.8 % — ABNORMAL LOW (ref 39.0–52.0)
MCHC: 34.3 g/dL (ref 30.0–36.0)
MCV: 92.6 fL (ref 78.0–100.0)
RDW: 13.8 % (ref 11.5–15.5)
WBC: 8.4 10*3/uL (ref 4.0–10.5)

## 2012-12-06 LAB — VITAMIN B12: Vitamin B-12: 670 pg/mL (ref 211–911)

## 2012-12-06 LAB — PRO B NATRIURETIC PEPTIDE: Pro B Natriuretic peptide (BNP): 898.6 pg/mL — ABNORMAL HIGH (ref 0–125)

## 2012-12-06 MED ORDER — BOOST / RESOURCE BREEZE PO LIQD
1.0000 | Freq: Every day | ORAL | Status: DC
  Administered 2012-12-06 – 2012-12-08 (×3): 1 via ORAL

## 2012-12-06 MED ORDER — ENSURE COMPLETE PO LIQD
237.0000 mL | Freq: Every day | ORAL | Status: DC
  Administered 2012-12-06 – 2012-12-07 (×2): 237 mL via ORAL

## 2012-12-06 MED ORDER — VITAMIN B-1 100 MG PO TABS
100.0000 mg | ORAL_TABLET | Freq: Every day | ORAL | Status: DC
  Administered 2012-12-06 – 2012-12-08 (×3): 100 mg via ORAL
  Filled 2012-12-06 (×3): qty 1

## 2012-12-06 MED ORDER — ACETAMINOPHEN 325 MG PO TABS
650.0000 mg | ORAL_TABLET | Freq: Three times a day (TID) | ORAL | Status: DC
  Administered 2012-12-06 – 2012-12-08 (×6): 650 mg via ORAL
  Filled 2012-12-06 (×6): qty 2

## 2012-12-06 MED ORDER — DICLOFENAC SODIUM 1 % TD GEL
4.0000 g | Freq: Three times a day (TID) | TRANSDERMAL | Status: DC
  Administered 2012-12-06 – 2012-12-08 (×6): 4 g via TOPICAL
  Filled 2012-12-06: qty 100

## 2012-12-06 NOTE — Progress Notes (Signed)
INITIAL NUTRITION ASSESSMENT  DOCUMENTATION CODES Per approved criteria  -Severe malnutrition in the context of chronic illness   Pt meets criteria for severe MALNUTRITION in the context of chronic illness as evidenced by severe depletion of fat and muscle loss.  INTERVENTION: Ensure Complete po daily provides 350 kcal and 13 grams of protein. Resource Breeze po daily provides 250 kcal and 9 grams of protein.  NUTRITION DIAGNOSIS: Malnutrition (severe) related to decreased appetite/oral intake as evidenced by diet hx and moderate to severe loss of both muscle and fat.   Goal: Pt to meet >/= 90% of their estimated nutrition needs   Monitor:  Po intake, labs and wt trends  Reason for Assessment: Malnutrition Screen Score = 5  67 y.o. male  ASSESSMENT: Pt presented to hospital with altered mental status and s/p recent falls per spouse.  Pt home diet Low Sodium with thin liquids. They limit sodium, fried foods and moderate protein intake due to his CKD. His wife says his appetite and fluid intake is variable (very poor since Sunday). Their usual meal pattern: 2 meals per day. He usually eat [place] yogurt, juice and toast for breakfast, and they have an evening meal. She says often he will eat a few bites and say he is full. Inadequate energy intake based on diet hx. Chronic undernutrition. Bedside swallow evaluation completed ST. Diet advanced to Dys 3 thin liquids. Feeds himself. Takes MVI daily at home.  Patient Active Problem List   Diagnosis Date Noted  . Altered mental status 12/05/2012  . Vascular dementia 12/05/2012  . CAD (coronary artery disease) 10/07/2011  . Chronic systolic CHF (congestive heart failure) 10/07/2011  . CKD (chronic kidney disease) stage 3, GFR 30-59 ml/min 10/07/2011  . Acute respiratory disease 10/03/2011  . Pulmonary edema with congestive heart failure with reduced left ventricular function 10/03/2011  . Non-STEMI (non-ST elevated myocardial infarction)  10/02/2011  . Hypokalemia 10/02/2011  . Abdominal aneurysm without mention of rupture 04/04/2011  . History of noncompliance with medical treatment   . Cerebrovascular disease   . Adenomatous polyp 10/19/2010  . Hypertensive heart disease without CHF   . Hyperlipidemia   . Chronic kidney disease   . Alcohol abuse   . Tobacco abuse   . Chronic obstructive pulmonary disease 02/09/2010  . History of  abdominal aortic aneurysm (stent graft)    Nutrition Focused Physical Exam:  Subcutaneous Fat:  Orbital Region: moderate-severe malnutrition Upper Arm Region: severe malnutrition Thoracic and Lumbar Region: not assessed  Muscle:  Temple Region: moderate malnutrition Clavicle Bone Region: severe malnutrition Clavicle and Acromion Bone Region: moderate-severe malnutrition Scapular Bone Region: moderate malnutrition Dorsal Hand: severe malnutrition  Patellar Region: severe malnutrition Anterior Thigh Region: severe malnutrition Posterior Calf Region: moderate malnutrition  Edema: none noted   Height: Ht Readings from Last 1 Encounters:  12/05/12 6\' 3"  (1.905 m)    Weight: Wt Readings from Last 1 Encounters:  12/05/12 155 lb 3.3 oz (70.4 kg)    Ideal Body Weight: 196# (89 kg)  % Ideal Body Weight: 79%  Wt Readings from Last 10 Encounters:  12/05/12 155 lb 3.3 oz (70.4 kg)  11/14/12 157 lb (71.215 kg)  10/17/12 158 lb (71.668 kg)  10/03/12 158 lb 6.4 oz (71.85 kg)  09/11/12 159 lb 12.8 oz (72.485 kg)  06/01/12 166 lb 0.6 oz (75.315 kg)  04/02/12 166 lb 8 oz (75.524 kg)  11/17/11 165 lb 9.1 oz (75.1 kg)  10/21/11 164 lb 4 oz (74.503 kg)  10/07/11  162 lb (73.483 kg)    Usual Body Weight: 165#  % Usual Body Weight: 94%  BMI:  Body mass index is 19.4 kg/(m^2).normal range  Estimated Nutritional Needs: Kcal: 2100-2450 Protein: 91-99 gr Fluid: 2100 ml/day  Skin: Intact  Diet Order:   EDUCATION NEEDS: -Education not appropriate at this time   Intake/Output  Summary (Last 24 hours) at 12/06/12 1016 Last data filed at 12/06/12 0928  Gross per 24 hour  Intake 691.25 ml  Output    100 ml  Net 591.25 ml    Last BM: PTA  Labs:   Recent Labs Lab 12/05/12 1345 12/06/12 0456  NA 135 135  K 4.6 4.0  CL 102 103  CO2 22 22  BUN 25* 24*  CREATININE 2.04* 1.88*  CALCIUM 9.9 9.1  GLUCOSE 91 84    CBG (last 3)  No results found for this basename: GLUCAP,  in the last 72 hours  Scheduled Meds: . amLODipine  5 mg Oral Daily  . atorvastatin  80 mg Oral q1800  . azithromycin  500 mg Intravenous Q24H  . carvedilol  6.25 mg Oral BID WC  . cefTRIAXone (ROCEPHIN)  IV  1 g Intravenous Q24H  . citalopram  10 mg Oral Daily  . colesevelam  625 mg Oral BID WC  . heparin  5,000 Units Subcutaneous Q8H  . multivitamin with minerals  1 tablet Oral Daily  . sodium chloride  3 mL Intravenous Q12H  . Ticagrelor  90 mg Oral BID    Continuous Infusions: . sodium chloride 75 mL/hr at 12/06/12 0015    Past Medical History  Diagnosis Date  . Hypertension   . COPD (chronic obstructive pulmonary disease)   . Hyperlipidemia   . Cardiomyopathy     EF of 30% per echo in June of 2012; admitted with congestive heart failure; nonobstructive CAD on cath in 08/2010  . History of noncompliance with medical treatment   . Cerebrovascular disease 2011    CVA  in 02/2010-only deficit is decreased vision in  right eye  . Alcohol abuse   . Tobacco abuse     40 pack years  . Abdominal aortic aneurysm     Stent graft repair  . Stroke 2011    decreased vision of right eye  . Chronic systolic CHF (congestive heart failure)   . Leg pain   . CKD (chronic kidney disease) stage 3, GFR 30-59 ml/min     creatinine-1.7 in 08/2010  . CAD (coronary artery disease)   . Myocardial infarction acute 10/01/11    Past Surgical History  Procedure Laterality Date  . Colonoscopy w/ polypectomy  2006    Dr. Katrinka Blazing: anal fissure, sessile adenomatous polyp, diverticulosis  .  Esophagogastroduodenoscopy  11/15/2010    Procedure: ESOPHAGOGASTRODUODENOSCOPY (EGD);  Surgeon: Arlyce Harman, MD;  Location: [facility];  Service: Endoscopy;  Laterality: N/A;  with propofol sedation; procedure start @ 0813  . Flexible sigmoidoscopy  11/15/2010    Procedure: FLEXIBLE SIGMOIDOSCOPY;  Surgeon: Arlyce Harman, MD;  Location: [facility];  Service: Endoscopy;  Laterality: N/A;  with propofol sedation; ended @ 0808  . Polypectomy  12/13/2010    Procedure: POLYPECTOMY;  Surgeon: Arlyce Harman, MD;  Location: [facility];  Service: Endoscopy;  Laterality: N/A;  right colon, cecal, transverse, and sigmoid polypectomy  . Cardiac catheterization  10/04/11    Normal left main, 95% pLAD stenosis with ulceration s/p successful BMS placement, 30% segmental plaque beyond this, dLAD after D2  with 50% plaquing, then focal 70% lesion, 80% focal short D2 stenosis, 30% pOM2 lesion, 30-40% mOM3 stenosis, Shephard's crook region of RCA with 50% lesion, mRCA 50-60% lesion and mild dRCA irregularities.   . Coronary stent placement  10/04/11  . Abdominal aortic aneurysm repair w/ endoluminal graft      01/16/2008    Royann Shivers MS,RD,LDN,CSG Office: [phone] Pager: [phone]

## 2012-12-06 NOTE — Clinical Social Work Note (Signed)
CSW spoke with [facility] who received clinicals on pt for SNF request. CM there reports it will have to be sent to medical director for review. Pt alone in room. He is aware that insurance may not authorize and we will not know until at least tomorrow. [employer] indicates alternative plan should be explored based on information from PT. Recommendations from PT are for home health. Pt is also agreeable to this. CM notified of above.   Derenda Fennel, [place] [phone]

## 2012-12-06 NOTE — Care Management Note (Signed)
    Page 1 of 2   12/07/2012     10:31:58 AM   CARE MANAGEMENT NOTE 12/07/2012  Patient:  [name], [name]   Account Number:  [id number]  Date Initiated:  12/06/2012  Documentation initiated by:  Sharrie Rothman  Subjective/Objective Assessment:   Pt admitted from home with pneumonia. Pt lives with his wife and uses [name] cane for ambulation. Pt has frequent falls at home. Pts wife is gone prn and pt is alone during those times. Pts wife would like to pursue placement.     Action/Plan:   PT recommends HH PT. CSW is aware of pts wife request and has initiated insurance auth. If insurance does not auth SNF, pt will go home with [facility]. CM will arrange [facility] with pts choice of facility.   Anticipated DC Date:  12/08/2012   Anticipated DC Plan:  HOME W HOME HEALTH SERVICES  In-house referral  Clinical Social Worker      DC Planning Services  CM consult      [facility] Choice  HOME HEALTH   Choice offered to / List presented to:  C-1 Patient        HH arranged  HH-2 PT  HH-1 RN      [facility] agency  [facility]   Status of service:  Completed, signed off Medicare Important Message given?   (If response is "NO", the following Medicare IM given date fields will be blank) Date Medicare IM given:   Date Additional Medicare IM given:    Discharge Disposition:  HOME W HOME HEALTH SERVICES  Per UR Regulation:    If discussed at Long Length of Stay Meetings, dates discussed:    Comments:  12/07/12 1030 Arlyss Queen, RN BSN CM Pt and pts wife chose [facility] for RN and PT. Pts wife states that they have all DME needed in the home. Alroy Bailiff of [facility] is aware of pt and will collect the pts information from the chart. Hh services to start within 48 hours of discharge. Pt and pts nurse aware of discharge arrangements.  12/06/12 1445 Arlyss Queen, RN BSN CM

## 2012-12-06 NOTE — Evaluation (Signed)
Clinical/Bedside Swallow Evaluation  Patient Details  Name: [name] MRN: [id number] Date of Birth: May 12, 1945  Today's Date: 12/06/2012 Time: 4098-1191 SLP Time Calculation (min): 20 min  Past Medical History:  Past Medical History  Diagnosis Date  . Hypertension   . COPD (chronic obstructive pulmonary disease)   . Hyperlipidemia   . Cardiomyopathy     EF of 30% per echo in June of 2012; admitted with congestive heart failure; nonobstructive CAD on cath in 08/2010  . History of noncompliance with medical treatment   . Cerebrovascular disease 2011    CVA  in 02/2010-only deficit is decreased vision in  right eye  . Alcohol abuse   . Tobacco abuse     40 pack years  . Abdominal aortic aneurysm     Stent graft repair  . Stroke 2011    decreased vision of right eye  . Chronic systolic CHF (congestive heart failure)   . Leg pain   . CKD (chronic kidney disease) stage 3, GFR 30-59 ml/min     creatinine-1.7 in 08/2010  . CAD (coronary artery disease)   . Myocardial infarction acute 10/01/11   Past Surgical History:  Past Surgical History  Procedure Laterality Date  . Colonoscopy w/ polypectomy  2006    Dr. Katrinka Blazing: anal fissure, sessile adenomatous polyp, diverticulosis  . Esophagogastroduodenoscopy  11/15/2010    Procedure: ESOPHAGOGASTRODUODENOSCOPY (EGD);  Surgeon: Arlyce Harman, MD;  Location: [facility];  Service: Endoscopy;  Laterality: N/A;  with propofol sedation; procedure start @ 0813  . Flexible sigmoidoscopy  11/15/2010    Procedure: FLEXIBLE SIGMOIDOSCOPY;  Surgeon: Arlyce Harman, MD;  Location: [facility];  Service: Endoscopy;  Laterality: N/A;  with propofol sedation; ended @ 0808  . Polypectomy  12/13/2010    Procedure: POLYPECTOMY;  Surgeon: Arlyce Harman, MD;  Location: [facility];  Service: Endoscopy;  Laterality: N/A;  right colon, cecal, transverse, and sigmoid polypectomy  . Cardiac catheterization  10/04/11    Normal left main, 95% pLAD stenosis with ulceration s/p  successful BMS placement, 30% segmental plaque beyond this, dLAD after D2 with 50% plaquing, then focal 70% lesion, 80% focal short D2 stenosis, 30% pOM2 lesion, 30-40% mOM3 stenosis, Shephard's crook region of RCA with 50% lesion, mRCA 50-60% lesion and mild dRCA irregularities.   . Coronary stent placement  10/04/11  . Abdominal aortic aneurysm repair w/ endoluminal graft      01/16/2008   HPI:  [name] is a 67 y.o. male who presents to the hospital with a four-day history of altered mental status associated with falls. He has had 2 falls, one 4 days ago and one today. On both occasions, his wife did not actually see him fall but found him on the floor of his bedroom. On the second occasion, he was able to get up with some help. On the first occasion his wife needed to pull him up using all her strength. She also describes that he has had a cough, nonproductive and also has felt warm as if he had a fever. The wife also describes almost a 23 year history of "mini strokes" and describes that he has had memory problems for the last 3 years. The patient requires help with bathing, dressing. His right leg is weak and drags and this is a more chronic problem. He is apparently able to feed himself. Over the last 3-4 days, he is a very poor intake, especially fluids. He has a history of chronic kidney  disease, hypertension, coronary artery disease with cardiomyopathy, ejection fraction 30-40%. He suffered a myocardial infarction, non-ST elevation, in the middle of July last year. He was managed at that time with bare-metal stent to a critical proximal LAD lesion.    Assessment / Plan / Recommendation Clinical Impression  Pt passed RN swallow screen, however pt's wife voiced concerns about "choking" episodes with liquids from time to time in pt so SLP completed BSE. Pt has a history of mini-strokes per wife. Pt seen at bedside with lunch meal. No oral motor deficits identified. Pt tolerated regular  textures and think liquids without incident. Wife reports that pt winces with liquid swallows, however this was not seen today. Pt advised to slowly take small sips to prevent choking episodes. If occult process is suspected, SLP can complete MBSS if desired. SLP will follow up x 1 for diet tolerance and upgrade to regular textures.    Aspiration Risk  Mild    Diet Recommendation Regular;Thin liquid   Liquid Administration via: Cup;Straw (small sips) Medication Administration: Whole meds with liquid Supervision: Patient able to self feed Compensations: Small sips/bites;Slow rate Postural Changes and/or Swallow Maneuvers: Seated upright 90 degrees;Upright 30-60 min after meal    Other  Recommendations Oral Care Recommendations: Oral care BID Other Recommendations: Clarify dietary restrictions   Follow Up Recommendations  None    Frequency and Duration min 1 x/week  1 week      SLP Swallow Goals Patient will consume recommended diet without observed clinical signs of aspiration with: Independent assistance Patient will utilize recommended strategies during swallow to increase swallowing safety with: Independent assistance   Swallow Study Prior Functional Status   Pt lives at home with wife. He typically consumes regular diet with thin liquids at home. Wife reports occasional choking with liquids.    General Date of Onset: 12/05/12 HPI: [name] is a 68 y.o. male who presents to the hospital with a four-day history of altered mental status associated with falls. He has had 2 falls, one 4 days ago and one today. On both occasions, his wife did not actually see him fall but found him on the floor of his bedroom. On the second occasion, he was able to get up with some help. On the first occasion his wife needed to pull him up using all her strength. She also describes that he has had a cough, nonproductive and also has felt warm as if he had a fever. The wife also describes almost a  23 year history of "mini strokes" and describes that he has had memory problems for the last 3 years. The patient requires help with bathing, dressing. His right leg is weak and drags and this is a more chronic problem. He is apparently able to feed himself. Over the last 3-4 days, he is a very poor intake, especially fluids. He has a history of chronic kidney disease, hypertension, coronary artery disease with cardiomyopathy, ejection fraction 30-40%. He suffered a myocardial infarction, non-ST elevation, in the middle of July last year. He was managed at that time with bare-metal stent to a critical proximal LAD lesion.  Type of Study: Bedside swallow evaluation Previous Swallow Assessment: None on record Diet Prior to this Study: Dysphagia 3 (soft);Thin liquids Temperature Spikes Noted: No Respiratory Status: Room air History of Recent Intubation: No Behavior/Cognition: Alert;Cooperative;Pleasant mood Oral Cavity - Dentition: Dentures, top;Dentures, bottom Self-Feeding Abilities: Able to feed self Patient Positioning: Upright in bed Baseline Vocal Quality: Clear Volitional Cough: Strong Volitional  Swallow: Able to elicit    Oral/Motor/Sensory Function Overall Oral Motor/Sensory Function: Appears within functional limits for tasks assessed   Ice Chips Ice chips: Not tested   Thin Liquid Thin Liquid: Within functional limits Presentation: Cup;Self Fed;Straw    Nectar Thick Nectar Thick Liquid: Not tested   Honey Thick Honey Thick Liquid: Not tested   Puree Puree: Within functional limits Presentation: Spoon;Self Fed   Solid   GO    Solid: Within functional limits Presentation: Self Fed      Thank you,  Havery Moros, CCC-SLP [phone]  Larrissa Stivers 12/06/2012,3:13 PM

## 2012-12-06 NOTE — Progress Notes (Addendum)
[facility] PROGRESS NOTE  [name] ZOX:[id number] DOB: February 16, 1946 DOA: 12/05/2012 PCP: [name], MD    Code Status: Full code Family Communication: Discussed with wife and daughter Disposition Plan: Likely discharge to home when clinically improved.   Consultants:  None  Procedures:  None  Antibiotics:  IV Azithromycin and ceftriaxone 12/05/2012  HPI/Subjective: The patient is sitting up in bed. He is alert. He has no complaints of chest pain or dizziness. His wife and daughter in the room.  Objective: Filed Vitals:   12/06/12 1411  BP: 158/77  Pulse: 65  Temp: 98 F (36.7 C)  Resp: 20    Intake/Output Summary (Last 24 hours) at 12/06/12 1816 Last data filed at 12/06/12 1342  Gross per 24 hour  Intake 811.25 ml  Output    200 ml  Net 611.25 ml   Filed Weights   12/05/12 1144 12/05/12 1940  Weight: 71.668 kg (158 lb) 70.4 kg (155 lb 3.3 oz)    Exam:   General:  67 year old sitting up in bed, in no acute distress.  Cardiovascular: S1, S2, with a soft systolic murmur.  Respiratory: Rare basilar wheezes. Breathing nonlabored.  Abdomen: Positive bowel sounds, soft, nontender, nondistended.  Musculoskeletal: Right knee small to moderate effusion and decrease in range of motion secondary to discomfort. Right knee is not warm or erythematous.  Neurologic: He is alert and oriented x2. Cranial nerves II through XII are grossly intact. Strength is 5 over 5 throughout with exception of right lower extremity which is decrease in strength due to to pain of the knee.  Data Reviewed: Basic Metabolic Panel:  Recent Labs Lab 12/05/12 1345 12/06/12 0456  NA 135 135  K 4.6 4.0  CL 102 103  CO2 22 22  GLUCOSE 91 84  BUN 25* 24*  CREATININE 2.04* 1.88*  CALCIUM 9.9 9.1   Liver Function Tests:  Recent Labs Lab 12/06/12 0456  AST 40*  ALT 15  ALKPHOS 86  BILITOT 0.4  PROT 6.6  ALBUMIN 3.0*   No results found for this basename:  LIPASE, AMYLASE,  in the last 168 hours No results found for this basename: AMMONIA,  in the last 168 hours CBC:  Recent Labs Lab 12/05/12 1345 12/06/12 0456  WBC 10.6* 8.4  NEUTROABS 7.1  --   HGB 12.6* 11.6*  HCT 36.1* 33.8*  MCV 93.0 92.6  PLT 123* 138*   Cardiac Enzymes:  Recent Labs Lab 12/05/12 1344  TROPONINI <0.30   BNP (last 3 results)  Recent Labs  12/06/12 0456  PROBNP 898.6*   CBG: No results found for this basename: GLUCAP,  in the last 168 hours  No results found for this or any previous visit (from the past 240 hour(s)).   Studies: Dg Chest 1 View  12/05/2012   CLINICAL DATA:  Altered mental status. Weakness.  EXAM: CHEST - 1 VIEW  COMPARISON:  CHEST x-ray 10/03/2011.  FINDINGS: Lung volumes are low. Patchy multifocal interstitial and very ill-defined vague airspace opacities are noted throughout the lungs bilaterally, most pronounced in the mid to lower lungs. Mild diffuse peribronchial cuffing. No pleural effusions. No evidence of pulmonary edema. Heart size appears borderline enlarged. Mediastinal contours are unremarkable.  IMPRESSION: 1. The appearance the chest suggests bronchitis with potential early bronchopneumonia in the mid to lower lungs bilaterally.   Electronically Signed   By: Trudie Reed M.D.   On: 12/05/2012 17:32   Dg Elbow Complete Right  12/05/2012   CLINICAL DATA:  Status  post fall x2. Right elbow pain and swelling.  EXAM: RIGHT ELBOW - COMPLETE 3+ VIEW  COMPARISON:  None.  FINDINGS: There is soft tissue swelling over the dorsum of the elbow compatible with olecranon bursitis, possibly hemorrhagic given history of fall. No fracture or dislocation is identified. No elbow joint effusion is seen. The patient has degenerative change about the elbow with osteophytosis most notable off the coronoid process of the ulna. Small enthesophyte at the triceps tendon insertion is noted.  IMPRESSION: 1. Posterior soft tissue swelling compatible with  olecranon bursitis, possibly hemorrhagic given history of fall. 2. Negative for fracture. 3. Degenerative change about the elbow.   Electronically Signed   By: Drusilla Kanner M.D.   On: 12/05/2012 14:27   Ct Head Wo Contrast  12/05/2012   CLINICAL DATA:  67 year old male with fall. Altered mental status. History of stroke.  EXAM: CT HEAD WITHOUT CONTRAST  TECHNIQUE: Contiguous axial images were obtained from the base of the skull through the vertex without intravenous contrast.  COMPARISON:  Brain MRI 10/10/2012 and earlier.  FINDINGS: Visualized paranasal sinuses and mastoids are clear. No acute osseous abnormality identified. Visualized orbits and scalp soft tissues are within normal limits.  Calcified atherosclerosis at the skull base. Widespread chronic deep gray matter nuclei lacunar infarcts re- identified. As noted on 10/10/2012 , the changes have progressed since 2012. There is also moderate to severe brainstem and cerebellar involvement.  No ventriculomegaly. No midline shift, mass effect, or evidence of intracranial mass lesion. No acute intracranial hemorrhage identified. Patchy in confluent cerebral white matter hypodensity has not significantly changed since 2012. No evidence of cortically based acute infarction identified. No suspicious intracranial vascular hyperdensity.  IMPRESSION: Advanced chronic small vessel ischemia. No acute intracranial abnormality.   Electronically Signed   By: Augusto Gamble M.D.   On: 12/05/2012 18:17   Mr Brain Wo Contrast  12/05/2012   CLINICAL DATA:  67 year old male with recent falls. Loss of balance and weakness.  EXAM: MRI HEAD WITHOUT CONTRAST  TECHNIQUE: Multiplanar, multisequence MR imaging was performed. No intravenous contrast was administered.  COMPARISON:  Head CT 12/05/2012. Brain MRI 10/10/2012.  FINDINGS: Major intracranial vascular flow voids are stable.  No restricted diffusion or evidence of acute infarction. Stable cerebral volume. No  ventriculomegaly. No midline shift, mass effect, or evidence of intracranial mass lesion.  Chronically very advanced small vessel ischemia throughout the brain re- identified. Stable gray and white matter signal throughout the brain. No acute intracranial hemorrhage identified. Negative pituitary. Negative cervicomedullary junction. Stable visualized upper cervical spine.  Visualized orbit soft tissues are within normal limits. Visualized paranasal sinuses and mastoids are clear. Stable scalp soft tissues. Normal bone marrow signal.  IMPRESSION: No acute intracranial abnormality. Very severe chronic small vessel ischemia re- identified.   Electronically Signed   By: Augusto Gamble M.D.   On: 12/05/2012 20:53   Dg Chest Port 1 View  12/06/2012   *RADIOLOGY REPORT*  Clinical Data: Follow up pneumonia  PORTABLE CHEST - 1 VIEW  Comparison: Prior radiograph from 12/05/2012  Findings: Cardiac and mediastinal silhouettes are stable in size and contour.  The lungs are normally inflated. Bronchial wall thickening with subtle patchy bibasilar opacities are again seen, suggestive of bronchitis.  No new airspace consolidation to suggest frank bacterial pneumonia identified.  No pulmonary edema or pleural effusion.  No pneumothorax.  IMPRESSION: Persistent mild peribronchial thickening within the perihilar regions and lower lobes, suggestive of possible bronchitis.  No new consolidative airspace disease  to suggest bacterial pneumonia identified.   Original Report Authenticated By: Rise Mu, M.D.   Dg Knee Complete 4 Views Right  12/05/2012   CLINICAL DATA:  Status post fall x2. Right knee pain and swelling.  EXAM: RIGHT KNEE - COMPLETE 4+ VIEW  COMPARISON:  None.  FINDINGS: The patient has a moderate to large knee joint effusion. No fracture is identified. There is degenerative change about the knee with joint space loss and osteophytosis appearing worst in the medial and patellofemoral compartments. Several rounded  calcifications are seen projecting over the posterior aspect of the joint and are most consistent with loose bodies within a Baker's cyst.  IMPRESSION: 1. Negative for fracture. 2. 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 3. Moderate to large knee joint effusion. 4. Calcifications in the posterior aspect of the knee are most consistent with loose bodies within a Baker's cyst.   Electronically Signed   By: Drusilla Kanner M.D.   On: 12/05/2012 14:25    Scheduled Meds: . acetaminophen  650 mg Oral TID WC  . amLODipine  5 mg Oral Daily  . atorvastatin  80 mg Oral q1800  . azithromycin  500 mg Intravenous Q24H  . carvedilol  6.25 mg Oral BID WC  . cefTRIAXone (ROCEPHIN)  IV  1 g Intravenous Q24H  . citalopram  10 mg Oral Daily  . colesevelam  625 mg Oral BID WC  . diclofenac sodium  4 g Topical TID AC  . feeding supplement  237 mL Oral Q2000  . feeding supplement  1 Container Oral Daily  . heparin  5,000 Units Subcutaneous Q8H  . multivitamin with minerals  1 tablet Oral Daily  . sodium chloride  3 mL Intravenous Q12H  . Ticagrelor  90 mg Oral BID   Continuous Infusions: . sodium chloride 75 mL/hr at 12/06/12 1804   Assessment/plan:  Active Problems:   Altered mental status   Fall at home   Hypertensive heart disease without CHF   Chronic kidney disease   Tobacco abuse   Cerebrovascular disease   CAD (coronary artery disease)   Vascular dementia   Protein-calorie malnutrition, severe   CKD (chronic kidney disease), stage III   Acute renal failure   Thrombocytopenia, unspecified   Hypertension   Acute bronchitis   UTI (urinary tract infection)   Effusion of right knee joint   The patient has improved clinically and symptomatically. MRI is negative for acute stroke. Agree that the patient is mildly volume depleted. He does have a history of stage III chronic kidney disease, but it is likely has acute on chronic renal insufficiency. His  recent fall at home, generalized weakness, and altered mental status etiologies are likely multifactorial including acute bronchitis, urinary tract infection, and degenerative joint disease of the right knee with effusion coupled with underlying chronic comorbid conditions.   Plan: 1. We'll continue IV fluids, but will decrease rate to 30 cc an hour. 2. We'll treat knee pain with arthritis strength Tylenol and Voltaren gel. 3. Continue supportive treatment. 4. For further evaluation, we'll check a TSH and vitamin B12 level. 5. The patient was advised to stop smoking. 6. Will thiamine empirically.  7. Discharge with home health PT versus skilled nursing facility for rehabilitation in the next 24-48 hours.  Time spent: 30 minutes.    [facility]  [facility] Pager [phone]. If 7PM-7AM, please contact night-coverage at [email or web address], password [facility] 12/06/2012, 6:16 PM  LOS: 1 day

## 2012-12-06 NOTE — Evaluation (Addendum)
Physical Therapy Evaluation Patient Details Name: [name] MRN: [id number] DOB: [birth year] Today's Date: 12/06/2012 Time: 8295-6213 PT Time Calculation (min): 56 min  PT Assessment / Plan / Recommendation History of Present Illness   Pt is admitted with altered mental status/falls.  He is thought to have had a probable TIA, possibly pneumonia.  He lives with his wife who states that he has had many "mini strokes" in the past.  He also has end stage DJD of the right knee with limited ROM and strength as well as pain on weight bearing.  He has insisted on using a cane for all gait, and in my opinion this has been setting him up for falls.  He should absolutely be using a walker for all gait.   Clinical Impression  Pt was seen for evaluation.  He is generally deconditioned with labored transfers due to painful right knee.  He is able to ambulate 23' with a rolling walker with good stability.  I have recommended HHPT at d/c, however his wife feels that he needs SNF.      PT Assessment  Patient needs continued PT services    Follow Up Recommendations  Home health PT;SNF (wife desires SNF at d/c)    Does the patient have the potential to tolerate intense rehabilitation    no  Barriers to Discharge Decreased caregiver support wife states that even though she is retired, she "has a life".    Equipment Recommendations  None recommended by PT    Recommendations for Other Services     Frequency Min 3X/week    Precautions / Restrictions Precautions Precautions: Fall Restrictions Weight Bearing Restrictions: No   Pertinent Vitals/Pain       Mobility  Bed Mobility Bed Mobility: Supine to Sit;Sit to Supine Supine to Sit: 5: Supervision;HOB flat Sit to Supine: 5: Supervision Details for Bed Mobility Assistance: transfer is slow and labored due to painful Right knee Transfers Transfers: Sit to Stand;Stand to Sit Sit to Stand: 5: Supervision;From bed;With upper extremity  assist Stand to Sit: 5: Supervision;To bed;With upper extremity assist Ambulation/Gait Ambulation/Gait Assistance: 5: Supervision Ambulation Distance (Feet): 175 Feet Assistive device: Rolling walker Gait Pattern: Step-through pattern;Decreased hip/knee flexion - right;Decreased stance time - right Gait velocity: WNL General Gait Details: leans heavily on walker, good stability Stairs: No Wheelchair Mobility Wheelchair Mobility: No    Exercises     PT Diagnosis: Difficulty walking;Abnormality of gait;Generalized weakness (chronic pain right knee)  PT Problem List: Decreased strength;Decreased range of motion;Decreased activity tolerance;Decreased mobility;Decreased knowledge of use of DME;Pain PT Treatment Interventions: DME instruction;Gait training;Functional mobility training;Therapeutic exercise     PT Goals(Current goals can be found in the care plan section)    Visit Information  Last PT Received On: 12/06/12       Prior Functioning  Home Living Family/patient expects to be discharged to:: Private residence Living Arrangements: Spouse/significant other Available Help at Discharge: Family Type of Home: House Home Access: Stairs to enter Secretary/administrator of Steps: 1 Home Layout: One level Home Equipment: Environmental consultant - 2 wheels;Cane - single point;Shower seat Prior Function Level of Independence: Needs assistance Gait / Transfers Assistance Needed: ambulates with a cane...has been resistant to using a walker ADL's / Homemaking Assistance Needed: dresses himself but needs assist with bath Communication Communication: No difficulties    Cognition  Cognition Arousal/Alertness: Awake/alert Behavior During Therapy: WFL for tasks assessed/performed Overall Cognitive Status: Within Functional Limits for tasks assessed    Extremity/Trunk Assessment Lower Extremity Assessment  Lower Extremity Assessment: RLE deficits/detail RLE Deficits / Details: very edemetous knee  with limited/painful ROM due to end stage DJD...knee ROM = 5-30 degrees.    Right LE is weak, 2/5 in general   Balance Balance Balance Assessed: No  End of Session PT - End of Session Equipment Utilized During Treatment: Gait belt Activity Tolerance: Patient tolerated treatment well Patient left: in bed;with call bell/phone within reach;with bed alarm set Nurse Communication: Mobility status  GP     Konrad Penta 12/06/2012, 10:39 AM

## 2012-12-06 NOTE — Clinical Social Work Psychosocial (Signed)
Clinical Social Work Department BRIEF PSYCHOSOCIAL ASSESSMENT 12/06/2012  Patient:  [name], [name]     Account Number:  [id number]     Admit date:  12/05/2012  Clinical Social Worker:  Nancie Neas  Date/Time:  12/06/2012 11:10 AM  Referred by:  Care Management  Date Referred:  12/06/2012 Referred for  SNF Placement   Other Referral:   Interview type:  Patient Other interview type:   and wife- Chyrese    PSYCHOSOCIAL DATA Living Status:  WIFE Admitted from facility:   Level of care:   Primary support name:  Chyrese Primary support relationship to patient:  SPOUSE Degree of support available:   supportive per pt    CURRENT CONCERNS Current Concerns  Post-Acute Placement   Other Concerns:    SOCIAL WORK ASSESSMENT / PLAN CSW met with pt and pt's wife at bedside. Pt alert and oriented and reports it is just him and his wife at home. Both are retired. Pt indicates family is involved and supportive. Pt has limited activity level at home, but his wife "has a life" and is gone a lot. Pt uses a cane at baseline, but is fairly independent with ADLs. He is no longer driving and his wife maintains the home. Pt has chronic knee issues which result in limited mobility and pain. He has had several falls at home and his wife felt he needed to be evaluated so they brought him to ED. Pt was assessed by PT today. He ambulated 32' with supervision and a rolling walker. PT is recommending home health, but his wife feels he needs SNF. CSW discussed insurance authorization process and they give permission for CSW to send PT notes to see if it would be approved. If not, CSW discussed option of ALF and both declined to consider this. If insurance will not authorize SNF, they plan for pt to return home with home health.   Assessment/plan status:  Psychosocial Support/Ongoing Assessment of Needs Other assessment/ plan:   Information/referral to community resources:   Will provide SNF list if  approved by insurance, or refer to CM for home health.    PATIENT'S/FAMILY'S RESPONSE TO PLAN OF CARE: Pt appears to be less enthusiastic about SNF than his wife, although he did give CSW permission to seek authorization. CSW will follow up after contact with [facility] [employer] to further discuss.       Derenda Fennel, [place] [phone]

## 2012-12-06 NOTE — Progress Notes (Signed)
UR chart review completed.  

## 2012-12-07 DIAGNOSIS — N390 Urinary tract infection, site not specified: Secondary | ICD-10-CM

## 2012-12-07 LAB — COMPREHENSIVE METABOLIC PANEL
Albumin: 2.8 g/dL — ABNORMAL LOW (ref 3.5–5.2)
BUN: 19 mg/dL (ref 6–23)
Creatinine, Ser: 1.69 mg/dL — ABNORMAL HIGH (ref 0.50–1.35)
GFR calc Af Amer: 46 mL/min — ABNORMAL LOW (ref >90)
Total Protein: 6.3 g/dL (ref 6.0–8.3)

## 2012-12-07 LAB — CBC
HCT: 31.6 % — ABNORMAL LOW (ref 39.0–52.0)
MCV: 92.7 fL (ref 78.0–100.0)
Platelets: 161 10*3/uL (ref 150–400)
RBC: 3.41 MIL/uL — ABNORMAL LOW (ref 4.22–5.81)
WBC: 6.4 10*3/uL (ref 4.0–10.5)

## 2012-12-07 LAB — URINE CULTURE: Colony Count: NO GROWTH

## 2012-12-07 LAB — IRON AND TIBC
Saturation Ratios: 17 % — ABNORMAL LOW (ref 20–55)
TIBC: 158 ug/dL — ABNORMAL LOW (ref 215–435)
UIBC: 131 ug/dL (ref 125–400)

## 2012-12-07 NOTE — Clinical Social Work Note (Signed)
CSW received call from B[place]ile [place] at [facility] stating that SNF authorization request was denied. CSW notified pt and pt's wife and they are agreeable to return home with home health. Will inform CM for follow up. CSW will sign off, but can be reconsulted if needed.  Derenda Fennel, [place] [phone]

## 2012-12-07 NOTE — Progress Notes (Signed)
Pharmacist Heart Failure Core Measure Documentation  Assessment: [name] has an EF documented as 35-40% on 10/02/11 by ECHO.  Rationale: Heart failure patients with left ventricular systolic dysfunction (LVSD) and an EF < 40% should be prescribed an angiotensin converting enzyme inhibitor (ACEI) or angiotensin receptor blocker (ARB) at discharge unless a contraindication is documented in the medical record.  This patient is not currently on an ACEI or ARB for HF.  This note is being placed in the record in order to provide documentation that a contraindication to the use of these agents is present for this encounter.  ACE Inhibitor or Angiotensin Receptor Blocker is contraindicated (specify all that apply)  []   ACEI allergy AND ARB allergy []   Angioedema []   Moderate or severe aortic stenosis []   Hyperkalemia []   Hypotension []   Renal artery stenosis [x]   Worsening renal function, preexisting renal disease or dysfunction   Elson Clan 12/07/2012 10:56 AM

## 2012-12-07 NOTE — Progress Notes (Signed)
[facility] PROGRESS NOTE  [name] ZOX:[id number] DOB: June 05, 1945 DOA: 12/05/2012 PCP: [name], MD    Code Status: Full code Family Communication: Discussed with wife  Disposition Plan: Likely discharge to home when clinically improved.   Consultants:  None  Procedures:  None  Antibiotics:  IV Azithromycin and ceftriaxone 12/05/2012  HPI/Subjective: The patient is sitting up in bed. He is alert. He has no complaints of chest pain or dizziness.   Objective: Filed Vitals:   12/07/12 0543  BP: 147/70  Pulse: 63  Temp: 98.5 F (36.9 C)  Resp: 20    Intake/Output Summary (Last 24 hours) at 12/07/12 1313 Last data filed at 12/07/12 1232  Gross per 24 hour  Intake    840 ml  Output    300 ml  Net    540 ml   Filed Weights   12/05/12 1144 12/05/12 1940  Weight: 71.668 kg (158 lb) 70.4 kg (155 lb 3.3 oz)    Exam:   General:  67 year old sitting up in bed, in no acute distress.  Cardiovascular: S1, S2, with a soft systolic murmur.  Respiratory: Rare basilar wheezes. Breathing nonlabored.  Abdomen: Positive bowel sounds, soft, nontender, nondistended.  Musculoskeletal: Right knee with moderate effusion. Less tender to palpation. Much better range of motion with flexion and extension. Right knee is not warm or erythematous.  Neurologic: He is alert and oriented x2. Cranial nerves II through XII are grossly intact. Strength is 5 over 5 throughout with exception of right lower extremity which is decrease in strength due to to pain of the knee.  Data Reviewed: Basic Metabolic Panel:  Recent Labs Lab 12/05/12 1345 12/06/12 0456 12/07/12 0505  NA 135 135 138  K 4.6 4.0 3.9  CL 102 103 108  CO2 22 22 22   GLUCOSE 91 84 91  BUN 25* 24* 19  CREATININE 2.04* 1.88* 1.69*  CALCIUM 9.9 9.1 9.0   Liver Function Tests:  Recent Labs Lab 12/06/12 0456 12/07/12 0505  AST 40* 48*  ALT 15 26  ALKPHOS 86 81  BILITOT 0.4 0.4  PROT 6.6 6.3   ALBUMIN 3.0* 2.8*   No results found for this basename: LIPASE, AMYLASE,  in the last 168 hours No results found for this basename: AMMONIA,  in the last 168 hours CBC:  Recent Labs Lab 12/05/12 1345 12/06/12 0456 12/07/12 0505  WBC 10.6* 8.4 6.4  NEUTROABS 7.1  --   --   HGB 12.6* 11.6* 11.1*  HCT 36.1* 33.8* 31.6*  MCV 93.0 92.6 92.7  PLT 123* 138* 161   Cardiac Enzymes:  Recent Labs Lab 12/05/12 1344  TROPONINI <0.30   BNP (last 3 results)  Recent Labs  12/06/12 0456  PROBNP 898.6*   CBG: No results found for this basename: GLUCAP,  in the last 168 hours  Recent Results (from the past 240 hour(s))  URINE CULTURE     Status: None   Collection Time    12/05/12  3:34 PM      Result Value Range Status   Specimen Description URINE, CLEAN CATCH   Final   Special Requests NONE   Final   Culture  Setup Time     Final   Value: 12/06/2012 01:19     Performed at [facility] Count     Final   Value: NO GROWTH     Performed at [facility]   Culture     Final   Value: NO  GROWTH     Performed at [facility]   Report Status 12/07/2012 FINAL   Final     Studies: Dg Chest 1 View  12/05/2012   CLINICAL DATA:  Altered mental status. Weakness.  EXAM: CHEST - 1 VIEW  COMPARISON:  CHEST x-ray 10/03/2011.  FINDINGS: Lung volumes are low. Patchy multifocal interstitial and very ill-defined vague airspace opacities are noted throughout the lungs bilaterally, most pronounced in the mid to lower lungs. Mild diffuse peribronchial cuffing. No pleural effusions. No evidence of pulmonary edema. Heart size appears borderline enlarged. Mediastinal contours are unremarkable.  IMPRESSION: 1. The appearance the chest suggests bronchitis with potential early bronchopneumonia in the mid to lower lungs bilaterally.   Electronically Signed   By: Trudie Reed M.D.   On: 12/05/2012 17:32   Dg Elbow Complete Right  12/05/2012   CLINICAL DATA:  Status post  fall x2. Right elbow pain and swelling.  EXAM: RIGHT ELBOW - COMPLETE 3+ VIEW  COMPARISON:  None.  FINDINGS: There is soft tissue swelling over the dorsum of the elbow compatible with olecranon bursitis, possibly hemorrhagic given history of fall. No fracture or dislocation is identified. No elbow joint effusion is seen. The patient has degenerative change about the elbow with osteophytosis most notable off the coronoid process of the ulna. Small enthesophyte at the triceps tendon insertion is noted.  IMPRESSION: 1. Posterior soft tissue swelling compatible with olecranon bursitis, possibly hemorrhagic given history of fall. 2. Negative for fracture. 3. Degenerative change about the elbow.   Electronically Signed   By: Drusilla Kanner M.D.   On: 12/05/2012 14:27   Ct Head Wo Contrast  12/05/2012   CLINICAL DATA:  67 year old male with fall. Altered mental status. History of stroke.  EXAM: CT HEAD WITHOUT CONTRAST  TECHNIQUE: Contiguous axial images were obtained from the base of the skull through the vertex without intravenous contrast.  COMPARISON:  Brain MRI 10/10/2012 and earlier.  FINDINGS: Visualized paranasal sinuses and mastoids are clear. No acute osseous abnormality identified. Visualized orbits and scalp soft tissues are within normal limits.  Calcified atherosclerosis at the skull base. Widespread chronic deep gray matter nuclei lacunar infarcts re- identified. As noted on 10/10/2012 , the changes have progressed since 2012. There is also moderate to severe brainstem and cerebellar involvement.  No ventriculomegaly. No midline shift, mass effect, or evidence of intracranial mass lesion. No acute intracranial hemorrhage identified. Patchy in confluent cerebral white matter hypodensity has not significantly changed since 2012. No evidence of cortically based acute infarction identified. No suspicious intracranial vascular hyperdensity.  IMPRESSION: Advanced chronic small vessel ischemia. No acute  intracranial abnormality.   Electronically Signed   By: Augusto Gamble M.D.   On: 12/05/2012 18:17   Mr Brain Wo Contrast  12/05/2012   CLINICAL DATA:  67 year old male with recent falls. Loss of balance and weakness.  EXAM: MRI HEAD WITHOUT CONTRAST  TECHNIQUE: Multiplanar, multisequence MR imaging was performed. No intravenous contrast was administered.  COMPARISON:  Head CT 12/05/2012. Brain MRI 10/10/2012.  FINDINGS: Major intracranial vascular flow voids are stable.  No restricted diffusion or evidence of acute infarction. Stable cerebral volume. No ventriculomegaly. No midline shift, mass effect, or evidence of intracranial mass lesion.  Chronically very advanced small vessel ischemia throughout the brain re- identified. Stable gray and white matter signal throughout the brain. No acute intracranial hemorrhage identified. Negative pituitary. Negative cervicomedullary junction. Stable visualized upper cervical spine.  Visualized orbit soft tissues are within normal limits. Visualized  paranasal sinuses and mastoids are clear. Stable scalp soft tissues. Normal bone marrow signal.  IMPRESSION: No acute intracranial abnormality. Very severe chronic small vessel ischemia re- identified.   Electronically Signed   By: Augusto Gamble M.D.   On: 12/05/2012 20:53   Dg Chest Port 1 View  12/06/2012   *RADIOLOGY REPORT*  Clinical Data: Follow up pneumonia  PORTABLE CHEST - 1 VIEW  Comparison: Prior radiograph from 12/05/2012  Findings: Cardiac and mediastinal silhouettes are stable in size and contour.  The lungs are normally inflated. Bronchial wall thickening with subtle patchy bibasilar opacities are again seen, suggestive of bronchitis.  No new airspace consolidation to suggest frank bacterial pneumonia identified.  No pulmonary edema or pleural effusion.  No pneumothorax.  IMPRESSION: Persistent mild peribronchial thickening within the perihilar regions and lower lobes, suggestive of possible bronchitis.  No new  consolidative airspace disease to suggest bacterial pneumonia identified.   Original Report Authenticated By: Rise Mu, M.D.   Dg Knee Complete 4 Views Right  12/05/2012   CLINICAL DATA:  Status post fall x2. Right knee pain and swelling.  EXAM: RIGHT KNEE - COMPLETE 4+ VIEW  COMPARISON:  None.  FINDINGS: The patient has a moderate to large knee joint effusion. No fracture is identified. There is degenerative change about the knee with joint space loss and osteophytosis appearing worst in the medial and patellofemoral compartments. Several rounded calcifications are seen projecting over the posterior aspect of the joint and are most consistent with loose bodies within a Baker's cyst.  IMPRESSION: 1. Negative for fracture. 2. 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 3. Moderate to large knee joint effusion. 4. Calcifications in the posterior aspect of the knee are most consistent with loose bodies within a Baker's cyst.   Electronically Signed   By: Drusilla Kanner M.D.   On: 12/05/2012 14:25    Scheduled Meds: . acetaminophen  650 mg Oral TID WC  . amLODipine  5 mg Oral Daily  . atorvastatin  80 mg Oral q1800  . azithromycin  500 mg Intravenous Q24H  . carvedilol  6.25 mg Oral BID WC  . cefTRIAXone (ROCEPHIN)  IV  1 g Intravenous Q24H  . citalopram  10 mg Oral Daily  . colesevelam  625 mg Oral BID WC  . diclofenac sodium  4 g Topical TID AC  . feeding supplement  237 mL Oral Q2000  . feeding supplement  1 Container Oral Daily  . heparin  5,000 Units Subcutaneous Q8H  . multivitamin with minerals  1 tablet Oral Daily  . sodium chloride  3 mL Intravenous Q12H  . thiamine  100 mg Oral Daily  . Ticagrelor  90 mg Oral BID   Continuous Infusions: . sodium chloride 30 mL/hr at 12/06/12 1846   Assessment/plan:  Active Problems:   Altered mental status   Fall at home   Hypertensive heart disease without CHF   Chronic kidney disease   Tobacco  abuse   Cerebrovascular disease   CAD (coronary artery disease)   Vascular dementia   Protein-calorie malnutrition, severe   CKD (chronic kidney disease), stage III   Acute renal failure   Thrombocytopenia, unspecified   Hypertension   Acute bronchitis   UTI (urinary tract infection)   Effusion of right knee joint   1. Altered mental status/acute encephalopathy and frequent falls. Resolved. Etiology multifactorial including acute bronchitis, urinary infection, degenerative joint disease of the right knee, acute renal failure. MRI negative for acute stroke.  Vitamin B12 level within normal limits. TSH is pending.  Acute bronchitis query early pneumonia. The patient has no significant wheezing. His white blood cell count has improved. We'll continue Rocephin, azithromycin, and when necessary nebulizers.  Right knee effusion. X-ray reveals no fracture. This is likely the consequence of degenerative joint disease. He is on arthritis strength Tylenol and Voltaren topical gel. These measures are helping.  Possible UTI. Urine culture negative so far. We'll continue antibiotics as above.  Acute renal failure superimposed on stage III chronic kidney disease. With hydration, his renal function is close to baseline. (His baseline creatinine was 1.6 in July 2014.)  Coronary artery disease/hypertensive heart disease. Currently stable. Troponin I is negative. We'll continue chronic cardiac medications.  Thrombocytopenia. Possibly secondary to infections. Now resolved. TSH pending.  Tobacco abuse. The patient was strongly advised to stop smoking.  History of falls. As above in #1. The physical therapist has seen the patient. Appreciate assistance. She is recommending a rolling walker and home health physical therapy. The patient does not want to use a walker but it is recommended. We'll arrange home health physical therapy.    Plan: 1. Will treat him one more day with IV antibiotics. 2. Out of  bed to the chair as tolerated. 3. Probable discharge to home tomorrow. 4. Tobacco cessation counseling.  Time spent: 30 minutes.    [facility]  [facility] Pager [phone]. If 7PM-7AM, please contact night-coverage at [email or web address], password [facility] 12/07/2012, 1:13 PM  LOS: 2 days

## 2012-12-07 NOTE — Progress Notes (Signed)
Speech Language Pathology Dysphagia Treatment Patient Details Name: [name] MRN: [id number] DOB: 08/30/1945 Today's Date: 12/07/2012 Time: 0865-7846 SLP Time Calculation (min): 20 min  Assessment / Plan / Recommendation Clinical Impression  Pt seen today for f/u of diet tolerance. Pt and spouse reported no difficulty with breakfast other than occasional "strangling on liquids." SLP had pt dry swallow where laryngeal excursion was palpated at [facility] and swallow was timely and strong. Pt given thin liquids via straw and cup where he swallowed without overt s/sx of aspiration and/or penetration. SLP reviewed safe swallowing techniques with pt and spouse and encouraged them to utilize these with liquids to prevent any further "strangling" incidences. Both in agreeance and voiced understanding.     Diet Recommendation  Continue with Current Diet: Dysphagia 3 (mechanical soft);Thin liquid    SLP Plan All goals met   Pertinent Vitals/Pain No pain reported.    Swallowing Goals  SLP Swallowing Goals Patient will consume recommended diet without observed clinical signs of aspiration with: Independent assistance Patient will utilize recommended strategies during swallow to increase swallowing safety with: Independent assistance  General Respiratory Status: Room air Behavior/Cognition: Alert;Cooperative;Pleasant mood Oral Cavity - Dentition: Dentures, top;Dentures, bottom Patient Positioning: Upright in bed  Oral Cavity - Oral Hygiene Does patient have any of the following "at risk" factors?: None of the above   Dysphagia Treatment Treatment focused on: Skilled observation of diet tolerance;Patient/family/caregiver education Family/Caregiver Educated: Spouse Treatment Methods/Modalities: Skilled observation Patient observed directly with PO's: Yes Type of PO's observed: Thin liquids Feeding: Able to feed self Liquids provided via: Straw;Cup   GO     [name] S 12/07/2012,  10:40 AM

## 2012-12-08 DIAGNOSIS — D649 Anemia, unspecified: Secondary | ICD-10-CM | POA: Diagnosis present

## 2012-12-08 LAB — CBC
HCT: 32.3 % — ABNORMAL LOW (ref 39.0–52.0)
MCHC: 34.4 g/dL (ref 30.0–36.0)
RDW: 13.6 % (ref 11.5–15.5)
WBC: 5.2 10*3/uL (ref 4.0–10.5)

## 2012-12-08 LAB — BASIC METABOLIC PANEL
Chloride: 105 mEq/L (ref 96–112)
Creatinine, Ser: 1.6 mg/dL — ABNORMAL HIGH (ref 0.50–1.35)
GFR calc Af Amer: 49 mL/min — ABNORMAL LOW (ref >90)
GFR calc non Af Amer: 43 mL/min — ABNORMAL LOW (ref >90)
Potassium: 3.8 mEq/L (ref 3.5–5.1)

## 2012-12-08 MED ORDER — ACETAMINOPHEN 325 MG PO TABS: 650.0000 mg | ORAL_TABLET | Freq: Three times a day (TID) | ORAL | Status: DC | PRN

## 2012-12-08 MED ORDER — AZITHROMYCIN 250 MG PO TABS: ORAL_TABLET | ORAL | Status: DC

## 2012-12-08 MED ORDER — CARVEDILOL 6.25 MG PO TABS: 6.2500 mg | ORAL_TABLET | Freq: Two times a day (BID) | ORAL | Status: DC

## 2012-12-08 MED ORDER — THIAMINE HCL 100 MG PO TABS: 100.0000 mg | ORAL_TABLET | Freq: Every day | ORAL | Status: DC

## 2012-12-08 MED ORDER — DICLOFENAC SODIUM 1 % TD GEL: TRANSDERMAL | Status: DC

## 2012-12-08 NOTE — Progress Notes (Signed)
Patient received discharge instructions along with follow up appointments and prescriptions. Patient verbalized understanding of all instructions. Patient was escorted by staff to vehicle. Patient discharged to home in stable condition. 

## 2012-12-08 NOTE — Discharge Summary (Signed)
Physician Discharge Summary  [name] ZOX:[id number] DOB: [birth year] DOA: 12/05/2012  PCP: [name], MD  Admit date: 12/05/2012 Discharge date: 12/08/2012  Time spent: Greater than 30 minutes  Recommendations for Outpatient Follow-up:  1. Home health physical therapy and RN were ordered. 2. The patient's heart rate needs to be reevaluated on carvedilol. He was instructed to not take it if his heart rate was less than 55 beats per minute.  Discharge Diagnoses:  1. Altered mental status/encephalopathy with falls at home. 2. Dehydration/volume depletion. 3. Acute bronchitis. 4. Urinary tract infection. 5. Acute renal failure superimposed on stage III chronic kidney disease. The patient's creatinine was 2.04 on admission and 1.6 at the time of discharge. 6. Right knee degenerative joint disease with joint effusion and right elbow swelling query olecranon bursitis. 7. Hypertension. 8. Coronary artery disease. 10. History of previous stroke with mild residual right-sided weakness. 11. Query mild vascular dementia. 12. Thrombocytopenia, likely secondary to infections. Resolved. 13. Normocytic anemia. Hemoglobin ranged from 12.6-11.1 following IV fluids. Total iron was 27, TIBC 158, and vitamin B12 670. 14. Tobacco abuse. The patient was strongly advised to stop smoking. 15. History of alcohol abuse, now stopped. 16. Bradycardia.  Discharge Condition: Improved.  Diet recommendation: Heart healthy.  Filed Weights   12/05/12 1144 12/05/12 1940  Weight: 71.668 kg (158 lb) 70.4 kg (155 lb 3.3 oz)    History of present illness:  The patient is a 67 year old man with a history significant for a previous CVA, hypertension, degenerative joint disease, and CAD. He presented to the emergency department on 12/05/2012 with a report of altered mental status and a couple of falls at home per his family. His family reported that his falls were nontraumatic, but he did have swelling of his  right knee. He had also had nonproductive cough and subjective fever. In the emergency department, he was afebrile and hemodynamically stable. His urinalysis revealed greater than 1.030 specific gravity and a few WBCs and RBCs. CT of his head revealed very severe chronic small vessel disease, but no acute intracranial abnormality. X-ray of his right knee revealed no fracture, but osteoarthritis and a moderate to large knee effusion. His chest x-ray revealed bronchitis with potential early bronchopneumonia in the mid to lower lungs bilaterally. His platelet count was 123. His WBC was 10.6. He was admitted for further evaluation and management.    Hospital Course:   1. Altered mental status/acute encephalopathy and frequent falls. The patient was found to be volume depleted/dehydrated. He was started on IV fluids for hydration. His mental status was monitored. There was no evidence of acute encephalopathy or altered mental status during the hospitalization. Nevertheless, a number of studies were ordered. MRI of the brain revealed no acute stroke. His troponin I was within normal limits. His vitamin B12 level was within normal limits. His TSH was within normal limits.  Etiology was likely multifactorial including acute bronchitis, urinary infection, degenerative joint disease of the right knee, acute renal failure and dehydration. He was evaluated by the physical therapist who recommended that the patient use his walker rather than a cane. Otherwise, he ambulated relatively well.    Acute bronchitis query early pneumonia.  The patient was afebrile and his white blood cell count was marginally elevated. Followup chest x-ray revealed no infiltrate, but with peribronchial thickening consistent with bronchitis. He has no significant wheezing. He was treated with Rocephin and azithromycin and as needed nebulizers. He was symptomatically improved at the time of discharge. He had no  complaints of chest pain, chest  congestion, or shortness of breath.  He was advised to stop smoking. He was discharged on 5 more days of azithromycin.  Right knee effusion/right elbow swelling.  X-rays revealed no fracture. He did not appear to be bothered much by the right elbow swelling, but he did have significant pain and edema over his right knee. There was no warmth or erythema. He has known arthritis and as he stated "bone on bone". He was treated with Tylenol and topical Voltaire gel. These measures prove to be effective as the effusion and pain subsided. He was instructed to continue Voltaren gel and Tylenol as needed.   Possible UTI. He was treated with antibiotics as above. His urine culture revealed no growth.    Acute renal failure superimposed on stage III chronic kidney disease. The patient's baseline creatinine is 1.6 per July 2014 labs. On admission, it was 2.04. With hydration, his creatinine improved to baseline of 1.6.  Coronary artery disease/hypertensive heart disease/bradycardia. Currently stable. Troponin I was negative. He was continued on chronic cardiac medications, however, because of bradycardia, he was instructed to hold carvedilol for a heart rate of less than 55. His TSH was within normal limits.  Thrombocytopenia. Possibly secondary to infections. Now resolved.   Tobacco abuse. The patient was strongly advised to stop smoking.   History of falls. As above in #1. Radiographically, there were no fractures. Home health physical therapy was ordered.      Procedures:  None  Consultations:  None  Discharge Exam: Filed Vitals:   12/08/12 0639  BP: 155/72  Pulse: 50  Temp: 98 F (36.7 C)  Resp: 20    General: Pleasant alert 67 year old man sitting up in a chair, in no acute distress. Cardiovascular: S1, S2, with a soft systolic murmur. Respiratory: Rare wheeze, otherwise clear. Breathing is nonlabored. Next line abdomen: Positive bowel sounds, soft, nontender,  nondistended. Musculoskeletal/extremities: Edema at the right elbow with good range of motion and minimal tenderness. Significant decrease in right knee effusion and increase in range of motion. Mild tenderness. Neurologic: He is alert and oriented x3. Radial nerves II through XII are grossly intact.  Discharge Instructions      Discharge Orders   Future Appointments Provider Department Dept Phone   12/17/2012 1:10 PM Babs Sciara, MD [facility] [phone]   04/08/2013 9:00 AM [facility] [phone]   Eat a light meal the night before the exam but please avoid gaseous foods.   Nothing to eat or drink for at least 8 hours prior to the exam. No gum chewing or smoking the morning of the exam. Please take your morning medications with small sips of water, especially blood pressure medication. If you have several vascular lab exams and will see physician, please bring a snack with you.   04/08/2013 9:40 AM Annye Rusk, NP [facility] [phone]   Future Orders Complete By Expires   Diet - low sodium heart healthy  As directed    Discharge instructions  As directed    Comments:     Stop smoking. Take medications as prescribed. Use your walker for walking.   Increase activity slowly  As directed        Medication List         acetaminophen 325 MG tablet  Commonly known as:  TYLENOL  Take 2 tablets (650 mg total) by mouth 3 (three) times daily as needed for  pain.     amLODipine 5 MG tablet  Commonly known as:  NORVASC  TAKE 1 TABLET EVERY DAY     atorvastatin 80 MG tablet  Commonly known as:  LIPITOR  TAKE 1 TABLET BY MOUTH DAILY AT 6 PM.     azithromycin 250 MG tablet  Commonly known as:  ZITHROMAX  Antibiotic. Starting tomorrow, take 2 tablets for 1 day. Then take 1 tablet daily thereafter until finished.     BRILINTA 90 MG Tabs tablet  Generic drug:  Ticagrelor  TAKE 1 TABLET  TWICE A DAY     carvedilol 6.25 MG tablet  Commonly known as:  COREG  Take 1 tablet (6.25 mg total) by mouth 2 (two) times daily with a meal. Do not take this medication if your heart rate is less than 55 beats per minute. Discuss further with your primary doctor.     citalopram 20 MG tablet  Commonly known as:  CELEXA  1/2 tablet daily     diclofenac sodium 1 % Gel  Commonly known as:  VOLTAREN  Apply to right knee twice daily for one more week, and twice daily only as needed thereafter.     multivitamin with minerals Tabs tablet  Take 1 tablet by mouth daily.     nitroGLYCERIN 0.4 MG SL tablet  Commonly known as:  NITROSTAT  Place 0.4 mg under the tongue every 5 (five) minutes as needed for chest pain.     thiamine 100 MG tablet  Take 1 tablet (100 mg total) by mouth daily. B. vitamin.     WELCHOL 625 MG tablet  Generic drug:  colesevelam  TAKE 2 TABLETS BY MOUTH 2 TIMES DAILY.       No Known Allergies Follow-up Information   Follow up with [facility].   Contact information:   [address] [place] [address] [phone]      Follow up with [name], MD On 12/17/2012. (At 1:10 PM)    Specialty:  Family Medicine   Contact information:   [address] [place] [address] [phone]        The results of significant diagnostics from this hospitalization (including imaging, microbiology, ancillary and laboratory) are listed below for reference.    Significant Diagnostic Studies: Dg Chest 1 View  12/05/2012   CLINICAL DATA:  Altered mental status. Weakness.  EXAM: CHEST - 1 VIEW  COMPARISON:  CHEST x-ray 10/03/2011.  FINDINGS: Lung volumes are low. Patchy multifocal interstitial and very ill-defined vague airspace opacities are noted throughout the lungs bilaterally, most pronounced in the mid to lower lungs. Mild diffuse peribronchial cuffing. No pleural effusions. No evidence of pulmonary edema. Heart size appears borderline  enlarged. Mediastinal contours are unremarkable.  IMPRESSION: 1. The appearance the chest suggests bronchitis with potential early bronchopneumonia in the mid to lower lungs bilaterally.   Electronically Signed   By: Trudie Reed M.D.   On: 12/05/2012 17:32   Dg Elbow Complete Right  12/05/2012   CLINICAL DATA:  Status post fall x2. Right elbow pain and swelling.  EXAM: RIGHT ELBOW - COMPLETE 3+ VIEW  COMPARISON:  None.  FINDINGS: There is soft tissue swelling over the dorsum of the elbow compatible with olecranon bursitis, possibly hemorrhagic given history of fall. No fracture or dislocation is identified. No elbow joint effusion is seen. The patient has degenerative change about the elbow with osteophytosis most notable off the coronoid process of the ulna. Small enthesophyte at the triceps  tendon insertion is noted.  IMPRESSION: 1. Posterior soft tissue swelling compatible with olecranon bursitis, possibly hemorrhagic given history of fall. 2. Negative for fracture. 3. Degenerative change about the elbow.   Electronically Signed   By: Drusilla Kanner M.D.   On: 12/05/2012 14:27   Ct Head Wo Contrast  12/05/2012   CLINICAL DATA:  67 year old male with fall. Altered mental status. History of stroke.  EXAM: CT HEAD WITHOUT CONTRAST  TECHNIQUE: Contiguous axial images were obtained from the base of the skull through the vertex without intravenous contrast.  COMPARISON:  Brain MRI 10/10/2012 and earlier.  FINDINGS: Visualized paranasal sinuses and mastoids are clear. No acute osseous abnormality identified. Visualized orbits and scalp soft tissues are within normal limits.  Calcified atherosclerosis at the skull base. Widespread chronic deep gray matter nuclei lacunar infarcts re- identified. As noted on 10/10/2012 , the changes have progressed since 2012. There is also moderate to severe brainstem and cerebellar involvement.  No ventriculomegaly. No midline shift, mass effect, or evidence of intracranial  mass lesion. No acute intracranial hemorrhage identified. Patchy in confluent cerebral white matter hypodensity has not significantly changed since 2012. No evidence of cortically based acute infarction identified. No suspicious intracranial vascular hyperdensity.  IMPRESSION: Advanced chronic small vessel ischemia. No acute intracranial abnormality.   Electronically Signed   By: Augusto Gamble M.D.   On: 12/05/2012 18:17   Mr Brain Wo Contrast  12/05/2012   CLINICAL DATA:  67 year old male with recent falls. Loss of balance and weakness.  EXAM: MRI HEAD WITHOUT CONTRAST  TECHNIQUE: Multiplanar, multisequence MR imaging was performed. No intravenous contrast was administered.  COMPARISON:  Head CT 12/05/2012. Brain MRI 10/10/2012.  FINDINGS: Major intracranial vascular flow voids are stable.  No restricted diffusion or evidence of acute infarction. Stable cerebral volume. No ventriculomegaly. No midline shift, mass effect, or evidence of intracranial mass lesion.  Chronically very advanced small vessel ischemia throughout the brain re- identified. Stable gray and white matter signal throughout the brain. No acute intracranial hemorrhage identified. Negative pituitary. Negative cervicomedullary junction. Stable visualized upper cervical spine.  Visualized orbit soft tissues are within normal limits. Visualized paranasal sinuses and mastoids are clear. Stable scalp soft tissues. Normal bone marrow signal.  IMPRESSION: No acute intracranial abnormality. Very severe chronic small vessel ischemia re- identified.   Electronically Signed   By: Augusto Gamble M.D.   On: 12/05/2012 20:53   Dg Chest Port 1 View  12/06/2012   *RADIOLOGY REPORT*  Clinical Data: Follow up pneumonia  PORTABLE CHEST - 1 VIEW  Comparison: Prior radiograph from 12/05/2012  Findings: Cardiac and mediastinal silhouettes are stable in size and contour.  The lungs are normally inflated. Bronchial wall thickening with subtle patchy bibasilar opacities are  again seen, suggestive of bronchitis.  No new airspace consolidation to suggest frank bacterial pneumonia identified.  No pulmonary edema or pleural effusion.  No pneumothorax.  IMPRESSION: Persistent mild peribronchial thickening within the perihilar regions and lower lobes, suggestive of possible bronchitis.  No new consolidative airspace disease to suggest bacterial pneumonia identified.   Original Report Authenticated By: Rise Mu, M.D.   Dg Knee Complete 4 Views Right  12/05/2012   CLINICAL DATA:  Status post fall x2. Right knee pain and swelling.  EXAM: RIGHT KNEE - COMPLETE 4+ VIEW  COMPARISON:  None.  FINDINGS: The patient has a moderate to large knee joint effusion. No fracture is identified. There is degenerative change about the knee with joint space loss and osteophytosis  appearing worst in the medial and patellofemoral compartments. Several rounded calcifications are seen projecting over the posterior aspect of the joint and are most consistent with loose bodies within a Baker's cyst.  IMPRESSION: 1. Negative for fracture. 2. 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 3. Moderate to large knee joint effusion. 4. Calcifications in the posterior aspect of the knee are most consistent with loose bodies within a Baker's cyst.   Electronically Signed   By: Drusilla Kanner M.D.   On: 12/05/2012 14:25    Microbiology: Recent Results (from the past 240 hour(s))  URINE CULTURE     Status: None   Collection Time    12/05/12  3:34 PM      Result Value Range Status   Specimen Description URINE, CLEAN CATCH   Final   Special Requests NONE   Final   Culture  Setup Time     Final   Value: 12/06/2012 01:19     Performed at [facility] Count     Final   Value: NO GROWTH     Performed at [facility]   Culture     Final   Value: NO GROWTH     Performed at [facility]   Report Status 12/07/2012 FINAL   Final      Labs: Basic Metabolic Panel:  Recent Labs Lab 12/05/12 1345 12/06/12 0456 12/07/12 0505 12/08/12 0617  NA 135 135 138 138  K 4.6 4.0 3.9 3.8  CL 102 103 108 105  CO2 22 22 22 24   GLUCOSE 91 84 91 92  BUN 25* 24* 19 15  CREATININE 2.04* 1.88* 1.69* 1.60*  CALCIUM 9.9 9.1 9.0 9.2   Liver Function Tests:  Recent Labs Lab 12/06/12 0456 12/07/12 0505  AST 40* 48*  ALT 15 26  ALKPHOS 86 81  BILITOT 0.4 0.4  PROT 6.6 6.3  ALBUMIN 3.0* 2.8*   No results found for this basename: LIPASE, AMYLASE,  in the last 168 hours No results found for this basename: AMMONIA,  in the last 168 hours CBC:  Recent Labs Lab 12/05/12 1345 12/06/12 0456 12/07/12 0505 12/08/12 0617  WBC 10.6* 8.4 6.4 5.2  NEUTROABS 7.1  --   --   --   HGB 12.6* 11.6* 11.1* 11.1*  HCT 36.1* 33.8* 31.6* 32.3*  MCV 93.0 92.6 92.7 92.0  PLT 123* 138* 161 173   Cardiac Enzymes:  Recent Labs Lab 12/05/12 1344  TROPONINI <0.30   BNP: BNP (last 3 results)  Recent Labs  12/06/12 0456  PROBNP 898.6*   CBG: No results found for this basename: GLUCAP,  in the last 168 hours     Signed:  Seena Ritacco  [facility] 12/08/2012, 12:23 PM

## 2012-12-17 ENCOUNTER — Ambulatory Visit (INDEPENDENT_AMBULATORY_CARE_PROVIDER_SITE_OTHER): Payer: Medicare Other | Admitting: Family Medicine

## 2012-12-17 ENCOUNTER — Encounter: Payer: Self-pay | Admitting: Family Medicine

## 2012-12-17 VITALS — BP 130/80 | Ht 75.0 in | Wt 162.2 lb

## 2012-12-17 DIAGNOSIS — J449 Chronic obstructive pulmonary disease, unspecified: Secondary | ICD-10-CM

## 2012-12-17 DIAGNOSIS — Z23 Encounter for immunization: Secondary | ICD-10-CM

## 2012-12-17 NOTE — Patient Instructions (Addendum)
The overall your exam today was good. I am believe that the area on your ankle looks good. I would recommend padding the foot of your bed so that it doesn't put too much pressure on your ankles when you sleep. Also remember do not tuck in your bathing at the foot of the bed.  I looked overall the test results from when you were in the hospital. I would continue your medications as is. I would like to see you back here in 4-6 weeks so we can reexamine you and we may need to do some additional lab work at that time. Continue to use your walker. Do try to quit smoking.

## 2012-12-17 NOTE — Progress Notes (Signed)
  Subjective:    Patient ID: [name], male    DOB: 13-Nov-1945, 67 y.o.   MRN: [id number]  Hypertension This is a chronic problem. The current episode started more than 1 year ago. The problem has been gradually improving since onset. The problem is controlled. There are no associated agents to hypertension. There are no known risk factors for coronary artery disease. Treatments tried: carvedilol. The current treatment provides significant improvement. There are no compliance problems.   Patient states he has no other concerns about his health today.  Patient does smoke he knows he needs to quit. He denies alcohol use. He relates compliance with his medicines. Denies rectal bleeding denies hematuria Patient does smoke Review of Systems Denies chest pain shortness of breath relates intermittent swelling in the legs intermittent confusion at times   patient observed walking with a walker he is able to balance properly with a walker I think he would have a difficult time if he was knocked using a walker. Objective:   Physical Exam Lungs are clear hearts regular pulse normal blood pressure good abdomen soft extremities no pressure sores seen on the ankle he does have diminished pulses in the foot vascular disease.       Assessment & Plan:  #1 recent hospitalization-this was reviewed with patient overall stable. #2 ataxia this continue physical therapy use walker to get around #3 pressure sore and ankle appears to be healing well he was talked to at length about how to minimize pressure sores #4 peripheral vascular disease no sign of claudication currently patient has minimal activity patient was going to quit smoking.

## 2012-12-24 DIAGNOSIS — I10 Essential (primary) hypertension: Secondary | ICD-10-CM

## 2013-01-01 NOTE — Addendum Note (Signed)
Encounter addended by: Angelica Pou, RN on: 01/01/2013  7:04 AM<BR>     Documentation filed: Clinical Notes

## 2013-01-01 NOTE — Progress Notes (Signed)
Cardiac Rehabilitation Program Outcomes Report   Orientation:  11/17/2011 Graduate Date:  02/27/2012 Discharge Date:  02/27/2012 # of sessions completed: 36 DX: STENT X 1 and MI  Cardiologist: Scammon Bing Family MD:  Dr. Lubertha South Class Time:  11:00  A.  Exercise Program:  Tolerates exercise @ 4.02 METS for 15 minutes, Bike Test Results:  Pre: Pre Bike Test HR 63, BP 128/62, O2 93%,  75, BP 186/90 and O2 935,, Post Hr was 56 and BP was 154/82, and O2 92% RPD 11. rode 1 mile and Discharged to home exercise program.  Anticipated compliance:  good  B.  Mental Health:  Good mental attitude and Quality of Life (QOL)  improvements:  Overall  21.97 %, Health/Functioning 19.03 %, Socioeconomics 23.93 %, Psych/Spiritual 22.29 %, Family 27.60 %    C.  Education/Instruction/Skills  Accurately checks own pulse.  Rest:  63  Exercise: 89, Knows THR for exercise and Attended 14 education classes  Uses Perceived Exertion Scale and/or Dyspnea Scale  D.  Nutrition/Weight Control/Body Composition:  Adherence to prescribed nutrition program: good  and Patient weight change: 5lbweight gain   E.  Blood Lipids    Lab Results  Component Value Date   CHOL 165 10/03/2012   HDL 44 10/03/2012   LDLCALC 108* 10/03/2012   TRIG 65 10/03/2012   CHOLHDL 3.8 10/03/2012    F.  Lifestyle Changes:  Making positive lifestyle changes and Continues to smoke  G.  Symptoms noted with exercise:  Asymptomatic  Report Completed By:  Lelon Huh. Sly Parlee RN   Comments:  This is patients graduation Report he has done well in Rehab. He achieved a peak METS of 4.02. His resting HR was 63 and resting BP was 150/80. His peak HR was 89 and peak BP was 170/98.

## 2013-01-01 NOTE — Addendum Note (Signed)
Encounter addended by: Angelica Pou, RN on: 01/01/2013  7:02 AM<BR>     Documentation filed: Notes Section

## 2013-01-02 ENCOUNTER — Telehealth: Payer: Self-pay | Admitting: Family Medicine

## 2013-01-02 NOTE — Telephone Encounter (Signed)
So noted we will follow up as planned (in about 4 weeks)

## 2013-01-02 NOTE — Telephone Encounter (Signed)
Pt is trending in the 140-150 systolic, for the past 3 nurse visits. RN, [facility] from [facility] wanted to make you aware in case you want to bring him in for a check up. She is discharging him today from [facility].

## 2013-01-18 ENCOUNTER — Other Ambulatory Visit: Payer: Self-pay | Admitting: Family Medicine

## 2013-01-29 ENCOUNTER — Ambulatory Visit (INDEPENDENT_AMBULATORY_CARE_PROVIDER_SITE_OTHER): Payer: Medicare Other | Admitting: Family Medicine

## 2013-01-29 ENCOUNTER — Encounter: Payer: Self-pay | Admitting: Family Medicine

## 2013-01-29 VITALS — BP 132/84 | Ht 75.0 in | Wt 165.0 lb

## 2013-01-29 DIAGNOSIS — I10 Essential (primary) hypertension: Secondary | ICD-10-CM

## 2013-01-29 NOTE — Progress Notes (Signed)
  Subjective:    Patient ID: [name], male    DOB: Jun 08, 1945, 67 y.o.   MRN: [id number]  HPIHere for a follow up. No concerns.  No stroke symptoms Eating healthy One fall no injury Still smoking- 1/2 pack per day Alcohol- none in a long time Past medical history reviewed family history reviewed. Review of Systems  Constitutional: Negative for fever, activity change, appetite change and fatigue.  HENT: Negative for rhinorrhea, sinus pressure and sneezing.   Respiratory: Negative for cough, chest tightness and shortness of breath.   Cardiovascular: Negative for chest pain and leg swelling.  Gastrointestinal: Negative for abdominal pain.  Musculoskeletal: Negative for arthralgias.       Objective:   Physical Exam  Vitals reviewed. Constitutional: He is oriented to person, place, and time. He appears well-developed.  HENT:  Head: Normocephalic.  Right Ear: External ear normal.  Left Ear: External ear normal.  Cardiovascular: Normal rate, regular rhythm and normal heart sounds.   No murmur heard. Pulmonary/Chest: Effort normal and breath sounds normal. No respiratory distress.  Abdominal: Soft. He exhibits no distension and no mass.  Musculoskeletal: He exhibits no edema.  Lymphadenopathy:    He has no cervical adenopathy.  Neurological: He is alert and oriented to person, place, and time.  Skin: No rash noted.          Assessment & Plan:  Quit smoking patient was instructed to quit smoking this is the best way for him to improve his health. He is gradually getting there but is not making great progress we talked about options to do so.  f/u 3 months and we will recheck his weight as well as blood pressure no need to do any blood work currently. Hypertension

## 2013-02-01 ENCOUNTER — Encounter: Payer: Self-pay | Admitting: Adult Health

## 2013-02-01 ENCOUNTER — Ambulatory Visit (INDEPENDENT_AMBULATORY_CARE_PROVIDER_SITE_OTHER): Payer: Medicare Other | Admitting: Adult Health

## 2013-02-01 VITALS — BP 178/88 | HR 57 | Ht 75.0 in | Wt 165.0 lb

## 2013-02-01 DIAGNOSIS — E785 Hyperlipidemia, unspecified: Secondary | ICD-10-CM

## 2013-02-01 DIAGNOSIS — I119 Hypertensive heart disease without heart failure: Secondary | ICD-10-CM

## 2013-02-01 DIAGNOSIS — I2510 Atherosclerotic heart disease of native coronary artery without angina pectoris: Secondary | ICD-10-CM

## 2013-02-01 NOTE — Assessment & Plan Note (Signed)
Blood pressure is elevated today on this visit. This is the first non-ST elevated. He is medically compliant. We will not make any changes on his medicines today. Continue to follow him every 6 months. He will need to be est. with cardiologist, Dr. Diona Browner in the absence of Dr. Dietrich Pates.

## 2013-02-01 NOTE — Progress Notes (Signed)
HPI: [name] is a 67 year old former patient of Dr. Dietrich Pates we are following for ongoing assessment and management of multiple cardiovascular issues. Most recent hospitalization in July of 2013 in the setting of non-ST elevation MI. Cardiac catheterization at that time revealed need for a bare-metal stent to the LAD secondary to a critical proximal lesion. He did have some residual LAD disease but felt this was needed to be treated medically after review by 2 cardiologists. He was last seen in the office in March of 2014 without any cardiac complaints. He was continued on dual antiplatelet therapy with Brilinta and  Aspirin.    He comes today without complaint. He is not very active, states that he remains at home mostly, watching television or reading. He denies recurrent chest pain, dyspnea on exertion, or dizziness. He is medically compliant. He is tolerating Brilinta without issues.      No Known Allergies  Current Outpatient Prescriptions  Medication Sig Dispense Refill  . acetaminophen (TYLENOL) 325 MG tablet Take 2 tablets (650 mg total) by mouth 3 (three) times daily as needed for pain.      Marland Kitchen amLODipine (NORVASC) 5 MG tablet TAKE 1 TABLET EVERY DAY  30 tablet  3  . atorvastatin (LIPITOR) 80 MG tablet TAKE 1 TABLET BY MOUTH DAILY AT 6 PM.  30 tablet  3  . BRILINTA 90 MG TABS tablet TAKE 1 TABLET TWICE A DAY  60 tablet  3  . carvedilol (COREG) 6.25 MG tablet Take 1 tablet (6.25 mg total) by mouth 2 (two) times daily with a meal. Do not take this medication if your heart rate is less than 55 beats per minute. Discuss further with your primary doctor.  60 tablet  3  . citalopram (CELEXA) 20 MG tablet TAKE 1/2 TABLET BY MOUTH EVERY DAY  15 tablet  2  . diclofenac sodium (VOLTAREN) 1 % GEL Apply to right knee twice daily for one more week, and twice daily only as needed thereafter.  100 g  1  . Multiple Vitamin (MULTIVITAMIN WITH MINERALS) TABS Take 1 tablet by mouth daily.      .  nitroGLYCERIN (NITROSTAT) 0.4 MG SL tablet Place 0.4 mg under the tongue every 5 (five) minutes as needed for chest pain.      Marland Kitchen thiamine 100 MG tablet Take 1 tablet (100 mg total) by mouth daily. B. vitamin.      . WELCHOL 625 MG tablet TAKE 2 TABLETS BY MOUTH 2 TIMES DAILY.  120 tablet  3  . [DISCONTINUED] cloNIDine (CATAPRES) 0.2 MG tablet Take 0.2 mg by mouth 2 (two) times daily.        . [DISCONTINUED] metoprolol (TOPROL-XL) 50 MG 24 hr tablet Take 50 mg by mouth daily.        . [DISCONTINUED] niacin (NIASPAN) 500 MG CR tablet Take 500 mg by mouth at bedtime.        . [DISCONTINUED] omeprazole (PRILOSEC) 20 MG capsule 1 po every morning  30 capsule  5   No current facility-administered medications for this visit.    Past Medical History  Diagnosis Date  . Hypertension   . COPD (chronic obstructive pulmonary disease)   . Hyperlipidemia   . Cardiomyopathy     EF of 30% per echo in June of 2012; admitted with congestive heart failure; nonobstructive CAD on cath in 08/2010  . History of noncompliance with medical treatment   . Cerebrovascular disease 2011    CVA  in  02/2010-only deficit is decreased vision in  right eye  . Alcohol abuse   . Tobacco abuse     40 pack years  . Abdominal aortic aneurysm     Stent graft repair  . Stroke 2011    decreased vision of right eye  . Chronic systolic CHF (congestive heart failure)   . Leg pain   . CKD (chronic kidney disease) stage 3, GFR 30-59 ml/min     creatinine-1.7 in 08/2010  . CAD (coronary artery disease)   . Myocardial infarction acute 10/01/11  . CKD (chronic kidney disease), stage III 12/06/2012  . Effusion of right knee joint 12/06/2012    Past Surgical History  Procedure Laterality Date  . Colonoscopy w/ polypectomy  2006    Dr. Katrinka Blazing: anal fissure, sessile adenomatous polyp, diverticulosis  . Esophagogastroduodenoscopy  11/15/2010    Procedure: ESOPHAGOGASTRODUODENOSCOPY (EGD);  Surgeon: Arlyce Harman, MD;  Location: [facility];   Service: Endoscopy;  Laterality: N/A;  with propofol sedation; procedure start @ 0813  . Flexible sigmoidoscopy  11/15/2010    Procedure: FLEXIBLE SIGMOIDOSCOPY;  Surgeon: Arlyce Harman, MD;  Location: [facility];  Service: Endoscopy;  Laterality: N/A;  with propofol sedation; ended @ 0808  . Polypectomy  12/13/2010    Procedure: POLYPECTOMY;  Surgeon: Arlyce Harman, MD;  Location: [facility];  Service: Endoscopy;  Laterality: N/A;  right colon, cecal, transverse, and sigmoid polypectomy  . Cardiac catheterization  10/04/11    Normal left main, 95% pLAD stenosis with ulceration s/p successful BMS placement, 30% segmental plaque beyond this, dLAD after D2 with 50% plaquing, then focal 70% lesion, 80% focal short D2 stenosis, 30% pOM2 lesion, 30-40% mOM3 stenosis, Shephard's crook region of RCA with 50% lesion, mRCA 50-60% lesion and mild dRCA irregularities.   . Coronary stent placement  10/04/11  . Abdominal aortic aneurysm repair w/ endoluminal graft      01/16/2008    ZHY:QMVHQI of systems complete and found to be negative unless listed above  PHYSICAL EXAM BP 178/88  Pulse 57  Ht 6\' 3"  (1.905 m)  Wt 165 lb (74.844 kg)  BMI 20.62 kg/m2  General: Well developed, well nourished, in no acute distress Head: Eyes PERRLA, No xanthomas.   Normal cephalic and atramatic  Lungs: Clear bilaterally to auscultation and percussion. Heart: HRRR S1 S2, without MRG.  Pulses are 2+ & equal.            No carotid bruit. No JVD.  No abdominal bruits. No femoral bruits. Abdomen: Bowel sounds are positive, abdomen soft and non-tender without masses or                  Hernia's noted. Msk:  Back normal, normal gait. Normal strength and tone for age. Extremities: No clubbing, cyanosis or edema.  DP +1 Neuro: Alert and oriented X 3. Psych:  Good affect, responds appropriately    ASSESSMENT AND PLAN

## 2013-02-01 NOTE — Progress Notes (Deleted)
Name: [name]    DOB: 1945/05/18  Age: 67 y.o.  MR#: [id number]       PCP:  [name], MD      Insurance: Payor: [employer] [place] [employer] / Plan: BLUE MEDICARE / Product Type: *No Product type* /   CC:    Chief Complaint  Patient presents with  . Hypertension  . Coronary Artery Disease    VS Filed Vitals:   02/01/13 1421  BP: 178/88  Pulse: 57  Height: 6\' 3"  (1.905 m)  Weight: 165 lb (74.844 kg)    Weights Current Weight  02/01/13 165 lb (74.844 kg)  01/29/13 165 lb (74.844 kg)  12/17/12 162 lb 4 oz (73.596 kg)    Blood Pressure  BP Readings from Last 3 Encounters:  02/01/13 178/88  01/29/13 132/84  12/17/12 130/80     Admit date:  (Not on file) Last encounter with RMR:  12/03/2012   Allergy Review of patient's allergies indicates no known allergies.  Current Outpatient Prescriptions  Medication Sig Dispense Refill  . acetaminophen (TYLENOL) 325 MG tablet Take 2 tablets (650 mg total) by mouth 3 (three) times daily as needed for pain.      Marland Kitchen amLODipine (NORVASC) 5 MG tablet TAKE 1 TABLET EVERY DAY  30 tablet  3  . atorvastatin (LIPITOR) 80 MG tablet TAKE 1 TABLET BY MOUTH DAILY AT 6 PM.  30 tablet  3  . BRILINTA 90 MG TABS tablet TAKE 1 TABLET TWICE A DAY  60 tablet  3  . carvedilol (COREG) 6.25 MG tablet Take 1 tablet (6.25 mg total) by mouth 2 (two) times daily with a meal. Do not take this medication if your heart rate is less than 55 beats per minute. Discuss further with your primary doctor.  60 tablet  3  . citalopram (CELEXA) 20 MG tablet TAKE 1/2 TABLET BY MOUTH EVERY DAY  15 tablet  2  . diclofenac sodium (VOLTAREN) 1 % GEL Apply to right knee twice daily for one more week, and twice daily only as needed thereafter.  100 g  1  . Multiple Vitamin (MULTIVITAMIN WITH MINERALS) TABS Take 1 tablet by mouth daily.      . nitroGLYCERIN (NITROSTAT) 0.4 MG SL tablet Place 0.4 mg under the tongue every 5 (five) minutes as needed for chest pain.       Marland Kitchen thiamine 100 MG tablet Take 1 tablet (100 mg total) by mouth daily. B. vitamin.      . WELCHOL 625 MG tablet TAKE 2 TABLETS BY MOUTH 2 TIMES DAILY.  120 tablet  3  . [DISCONTINUED] cloNIDine (CATAPRES) 0.2 MG tablet Take 0.2 mg by mouth 2 (two) times daily.        . [DISCONTINUED] metoprolol (TOPROL-XL) 50 MG 24 hr tablet Take 50 mg by mouth daily.        . [DISCONTINUED] niacin (NIASPAN) 500 MG CR tablet Take 500 mg by mouth at bedtime.        . [DISCONTINUED] omeprazole (PRILOSEC) 20 MG capsule 1 po every morning  30 capsule  5   No current facility-administered medications for this visit.    Discontinued Meds:   There are no discontinued medications.  Patient Active Problem List   Diagnosis Date Noted  . Anemia 12/08/2012  . Protein-calorie malnutrition, severe 12/06/2012  . CKD (chronic kidney disease), stage III 12/06/2012  . Acute renal failure 12/06/2012  . Thrombocytopenia, unspecified 12/06/2012  . Fall at home 12/06/2012  .  Hypertension 12/06/2012  . Acute bronchitis 12/06/2012  . UTI (urinary tract infection) 12/06/2012  . Effusion of right knee joint 12/06/2012  . Altered mental status 12/05/2012  . Vascular dementia 12/05/2012  . CAD (coronary artery disease) 10/07/2011  . Chronic systolic CHF (congestive heart failure) 10/07/2011  . CKD (chronic kidney disease) stage 3, GFR 30-59 ml/min 10/07/2011  . Acute respiratory disease 10/03/2011  . Pulmonary edema with congestive heart failure with reduced left ventricular function 10/03/2011  . Non-STEMI (non-ST elevated myocardial infarction) 10/02/2011  . Hypokalemia 10/02/2011  . Abdominal aneurysm without mention of rupture 04/04/2011  . History of noncompliance with medical treatment   . Cerebrovascular disease   . Adenomatous polyp 10/19/2010  . Hypertensive heart disease without CHF   . Hyperlipidemia   . Chronic kidney disease   . Alcohol abuse   . Tobacco abuse   . Chronic obstructive pulmonary disease  02/09/2010  . History of  abdominal aortic aneurysm (stent graft)     LABS    Component Value Date/Time   NA 138 12/08/2012 0617   NA 138 12/07/2012 0505   NA 135 12/06/2012 0456   K 3.8 12/08/2012 0617   K 3.9 12/07/2012 0505   K 4.0 12/06/2012 0456   CL 105 12/08/2012 0617   CL 108 12/07/2012 0505   CL 103 12/06/2012 0456   CO2 24 12/08/2012 0617   CO2 22 12/07/2012 0505   CO2 22 12/06/2012 0456   GLUCOSE 92 12/08/2012 0617   GLUCOSE 91 12/07/2012 0505   GLUCOSE 84 12/06/2012 0456   BUN 15 12/08/2012 0617   BUN 19 12/07/2012 0505   BUN 24* 12/06/2012 0456   CREATININE 1.60* 12/08/2012 0617   CREATININE 1.69* 12/07/2012 0505   CREATININE 1.88* 12/06/2012 0456   CREATININE 1.59* 10/03/2012 1341   CREATININE 2.20* 10/07/2010 1420   CREATININE 1.71* 09/30/2010 1305   CALCIUM 9.2 12/08/2012 0617   CALCIUM 9.0 12/07/2012 0505   CALCIUM 9.1 12/06/2012 0456   GFRNONAA 43* 12/08/2012 0617   GFRNONAA 40* 12/07/2012 0505   GFRNONAA 35* 12/06/2012 0456   GFRAA 49* 12/08/2012 0617   GFRAA 46* 12/07/2012 0505   GFRAA 41* 12/06/2012 0456   CMP     Component Value Date/Time   NA 138 12/08/2012 0617   K 3.8 12/08/2012 0617   CL 105 12/08/2012 0617   CO2 24 12/08/2012 0617   GLUCOSE 92 12/08/2012 0617   BUN 15 12/08/2012 0617   CREATININE 1.60* 12/08/2012 0617   CREATININE 1.59* 10/03/2012 1341   CALCIUM 9.2 12/08/2012 0617   PROT 6.3 12/07/2012 0505   ALBUMIN 2.8* 12/07/2012 0505   AST 48* 12/07/2012 0505   ALT 26 12/07/2012 0505   ALKPHOS 81 12/07/2012 0505   BILITOT 0.4 12/07/2012 0505   GFRNONAA 43* 12/08/2012 0617   GFRAA 49* 12/08/2012 0617       Component Value Date/Time   WBC 5.2 12/08/2012 0617   WBC 6.4 12/07/2012 0505   WBC 8.4 12/06/2012 0456   HGB 11.1* 12/08/2012 0617   HGB 11.1* 12/07/2012 0505   HGB 11.6* 12/06/2012 0456   HCT 32.3* 12/08/2012 0617   HCT 31.6* 12/07/2012 0505   HCT 33.8* 12/06/2012 0456   MCV 92.0 12/08/2012 0617   MCV 92.7 12/07/2012 0505   MCV 92.6 12/06/2012 0456    Lipid Panel      Component Value Date/Time   CHOL 165 10/03/2012 1341   TRIG 65 10/03/2012 1341   HDL 44 10/03/2012 1341  CHOLHDL 3.8 10/03/2012 1341   VLDL 13 10/03/2012 1341   LDLCALC 108* 10/03/2012 1341    ABG    Component Value Date/Time   PHART 7.241* 07/02/2008 0600   PCO2ART 48.0* 07/02/2008 0600   PO2ART 58.4* 07/02/2008 0600   HCO3 19.9* 07/02/2008 0600   TCO2 17.4 07/02/2008 0600   ACIDBASEDEF 6.2* 07/02/2008 0600   O2SAT 84.2 07/02/2008 0600     Lab Results  Component Value Date   TSH 2.931 12/07/2012   BNP (last 3 results)  Recent Labs  12/06/12 0456  PROBNP 898.6*   Cardiac Panel (last 3 results) No results found for this basename: CKTOTAL, CKMB, TROPONINI, RELINDX,  in the last 72 hours  Iron/TIBC/Ferritin    Component Value Date/Time   IRON 27* 12/07/2012 0505   TIBC 158* 12/07/2012 0505     EKG Orders placed during the hospital encounter of 12/05/12  . ED EKG  . ED EKG  . EKG 12-LEAD  . EKG 12-LEAD  . EKG     Prior Assessment and Plan Problem List as of 02/01/2013     Cardiovascular and Mediastinum   Hypertensive heart disease without CHF   Last Assessment & Plan   06/01/2012 Office Visit Written 06/01/2012  1:56 PM by Jodelle Gross, NP     He is doing well. Weight is stable, there is no evidence of fluid retention, he is denying any dyspnea on exertion or problems concerning edema. He is medically compliant with the assistance of his daughter. He remains very sedentary. He will become more active as the weather becomes warmer. There is some evidence of mild dementia which to some closer to home.    History of  abdominal aortic aneurysm (stent graft)   Last Assessment & Plan   11/18/2010 Office Visit Written 11/18/2010  4:30 PM by Kathlen Brunswick, MD     He is followed by Dr. Myra Gianotti with an appointment scheduled for early next year.    Cerebrovascular disease   Abdominal aneurysm without mention of rupture   Non-STEMI (non-ST elevated myocardial infarction)    Last Assessment & Plan   10/21/2011 Office Visit Written 10/21/2011  1:26 PM by Jodelle Gross, NP     He has had a recent admission with PCI of the LAD using a bare-metal stent on 10/05/2011. He will continue on Alimta aspirin and current medication regimen. He was not found to be a candidate for ACE inhibitor or ARB in the setting of chronic kidney disease stage III. His GFR was 30-59 mL a minute her discharge summary report. I have referred him to cardiac rehabilitation for assistance in motivation of healthy lifestyle, exercise, and socialization which may help him with his apparent depression.    Pulmonary edema with congestive heart failure with reduced left ventricular function   CAD (coronary artery disease)   Last Assessment & Plan   06/01/2012 Office Visit Written 06/01/2012  1:57 PM by Jodelle Gross, NP     He is without cardiac complaints. Continue risk management. We will followup with him in 6 months unless he becomes symptomatic. All cardiac medications are refilled.    Chronic systolic CHF (congestive heart failure)   Hypertension     Respiratory   Chronic obstructive pulmonary disease   Last Assessment & Plan   09/20/2010 Office Visit Written 09/20/2010  4:42 PM by Jodelle Gross, NP     He is advised to quit smoking.  He verbalizes understanding.  Does not plan  on quitting anytime soon.    Acute respiratory disease   Acute bronchitis     Digestive   Adenomatous polyp   Last Assessment & Plan   11/18/2010 Office Visit Written 11/18/2010 11:09 AM by Kathlen Brunswick, MD     Polypectomy in 2006      Nervous and Auditory   Vascular dementia     Musculoskeletal and Integument   Effusion of right knee joint     Genitourinary   Chronic kidney disease   Last Assessment & Plan   11/18/2010 Office Visit Edited 11/24/2010  6:13 AM by Kathlen Brunswick, MD     Renal dysfunction has been somewhat progressive and will require ongoing monitoring.  Metabolic profiles will  be obtained in 1 and 3 months.  I will reassess this nice gentleman in 7 months.    CKD (chronic kidney disease) stage 3, GFR 30-59 ml/min   CKD (chronic kidney disease), stage III   Acute renal failure   UTI (urinary tract infection)     Hematopoietic and Hemostatic   Thrombocytopenia, unspecified     Other   Hyperlipidemia   Last Assessment & Plan   06/01/2012 Office Visit Written 06/01/2012  1:57 PM by Jodelle Gross, NP     I have ordered followup lipids and LFTs in 3 months for ongoing assessment and management. He is to continue adhering to a low cholesterol diet continuation of statin therapy.    Alcohol abuse   Last Assessment & Plan   11/18/2010 Office Visit Written 11/18/2010  7:42 PM by Kathlen Brunswick, MD     Abstinent for at least 10 years.    Tobacco abuse   Last Assessment & Plan   11/18/2010 Office Visit Written 11/18/2010  7:52 PM by Kathlen Brunswick, MD     Complete discontinuation of tobacco use as advised.  Patient agrees to make the attempt.    History of noncompliance with medical treatment   Hypokalemia   Altered mental status   Protein-calorie malnutrition, severe   Fall at home   Anemia       Imaging: No results found.

## 2013-02-01 NOTE — Patient Instructions (Signed)
Your physician recommends that you schedule a follow-up appointment in: 6 months You will receive a reminder letter two months in advance reminding you to call and schedule your appointment. If you don't receive this letter, please contact our office.  Your physician recommends that you continue on your current medications as directed. Please refer to the Current Medication list given to you today.     

## 2013-02-01 NOTE — Assessment & Plan Note (Signed)
We will continue current medication regimen. He is tolerating Brilinta, and aspirin. He is able to afford the medicine. No bleeding issues. We will see him again in 6 months. He is advised to come to see [place] sooner should he become symptomatic. He will be est. with Dr. Diona Browner.

## 2013-02-01 NOTE — Assessment & Plan Note (Signed)
Recently checked 4 months ago by primary care physician. He will continue current medication regimen.

## 2013-03-18 ENCOUNTER — Other Ambulatory Visit: Payer: Self-pay | Admitting: Adult Health

## 2013-04-04 ENCOUNTER — Encounter: Payer: Self-pay | Admitting: Family

## 2013-04-05 ENCOUNTER — Ambulatory Visit (HOSPITAL_COMMUNITY)
Admission: RE | Admit: 2013-04-05 | Discharge: 2013-04-05 | Disposition: A | Discharge status: Home / Self Care | Payer: Medicare Other | Source: Ambulatory Visit | Attending: Family | Admitting: Family

## 2013-04-05 ENCOUNTER — Encounter (INDEPENDENT_AMBULATORY_CARE_PROVIDER_SITE_OTHER): Payer: Self-pay

## 2013-04-05 ENCOUNTER — Encounter: Payer: Self-pay | Admitting: Family

## 2013-04-05 ENCOUNTER — Ambulatory Visit (INDEPENDENT_AMBULATORY_CARE_PROVIDER_SITE_OTHER): Payer: Medicare Other | Admitting: Family

## 2013-04-05 VITALS — BP 155/88 | HR 53 | Resp 14 | Ht 75.0 in | Wt 170.0 lb

## 2013-04-05 DIAGNOSIS — Z48812 Encounter for surgical aftercare following surgery on the circulatory system: Secondary | ICD-10-CM | POA: Insufficient documentation

## 2013-04-05 DIAGNOSIS — I714 Abdominal aortic aneurysm, without rupture, unspecified: Secondary | ICD-10-CM

## 2013-04-05 DIAGNOSIS — Z09 Encounter for follow-up examination after completed treatment for conditions other than malignant neoplasm: Secondary | ICD-10-CM | POA: Insufficient documentation

## 2013-04-05 NOTE — Progress Notes (Signed)
[facility] [place]  Established EVAR  History of Present Illness  [name] is a 69 y.o. (November 13, 1945) male patient of Dr. [name] who is status post endovascular aneurysm repair on 01/16/2008 using a Cook Zenith device. He presents for routine follow up s/p EVAR.    The patient has noback or abdominal pain. He denies claudication pain in legs, denies non-healing wounds.  He had an MI in July 2013 and completed cardiac rehabilitation. A coronary stent was placed. He reports several strokes and does not know when his last stroke was; manifested as some degree of loss of vision and trouble speaking, denies hemiplegia. He states he quit ETOH use a few months ago, states he stopped that for now and may resume, documented 28 shots of liquor/week in the past. Off balance at times, the reason he uses walker. Noted that carotid Duplex done January, 2014 shows <40% bilateral ICA stenosis.   Pt Diabetic: No Pt smoker: smoker  (3/4 ppd x 50+ yrs)   Past Medical History  Diagnosis Date  . Hypertension   . COPD (chronic obstructive pulmonary disease)   . Hyperlipidemia   . Cardiomyopathy     EF of 30% per echo in June of 2012; admitted with congestive heart failure; nonobstructive CAD on cath in 08/2010  . History of noncompliance with medical treatment   . Cerebrovascular disease 2011    CVA  in 02/2010-only deficit is decreased vision in  right eye  . Alcohol abuse   . Tobacco abuse     40 pack years  . Abdominal aortic aneurysm     Stent graft repair  . Stroke 2011    decreased vision of right eye  . Chronic systolic CHF (congestive heart failure)   . Leg pain   . CKD (chronic kidney disease) stage 3, GFR 30-59 ml/min     creatinine-1.7 in 08/2010  . CAD (coronary artery disease)   . Myocardial infarction acute 10/01/11  . CKD (chronic kidney disease), stage III 12/06/2012  . Effusion of right knee joint 12/06/2012   Past Surgical History  Procedure  Laterality Date  . Colonoscopy w/ polypectomy  2006    Dr. Tamala Julian: anal fissure, sessile adenomatous polyp, diverticulosis  . Esophagogastroduodenoscopy  11/15/2010    Procedure: ESOPHAGOGASTRODUODENOSCOPY (EGD);  Surgeon: Dorothyann Peng, MD;  Location: [facility];  Service: Endoscopy;  Laterality: N/A;  with propofol sedation; procedure start @ 0813  . Flexible sigmoidoscopy  11/15/2010    Procedure: FLEXIBLE SIGMOIDOSCOPY;  Surgeon: Dorothyann Peng, MD;  Location: [facility];  Service: Endoscopy;  Laterality: N/A;  with propofol sedation; ended @ 0808  . Polypectomy  12/13/2010    Procedure: POLYPECTOMY;  Surgeon: Dorothyann Peng, MD;  Location: [facility];  Service: Endoscopy;  Laterality: N/A;  right colon, cecal, transverse, and sigmoid polypectomy  . Cardiac catheterization  10/04/11    Normal left main, 95% pLAD stenosis with ulceration s/p successful BMS placement, 30% segmental plaque beyond this, dLAD after D2 with 50% plaquing, then focal 70% lesion, 80% focal short D2 stenosis, 30% pOM2 lesion, 30-40% mOM3 stenosis, Shephard's crook region of RCA with 50% lesion, mRCA 50-60% lesion and mild dRCA irregularities.   . Coronary stent placement  10/04/11  . Abdominal aortic aneurysm repair w/ endoluminal graft      01/16/2008   Social History History  Substance Use Topics  . Smoking status: Current Every Day Smoker -- 0.50 packs/day for 50 years    Types:  Cigarettes  . Smokeless tobacco: Never Used  . Alcohol Use: 16.8 oz/week    28 Shots of liquor per week     Comment: quit about 1 month ago, prior to this would drink about 7 oz of liquor per day 10/2012   Family History Family History  Problem Relation Age of Onset  . Colon cancer Neg Hx   . Anesthesia problems Neg Hx   . Hypotension Neg Hx   . Malignant hyperthermia Neg Hx   . Pseudochol deficiency Neg Hx   . Cancer Mother   . Heart disease Mother     before age 76  . Hyperlipidemia Mother   . Hypertension Mother    Current Outpatient  Prescriptions on File Prior to Visit  Medication Sig Dispense Refill  . amLODipine (NORVASC) 5 MG tablet TAKE 1 TABLET EVERY DAY  30 tablet  3  . atorvastatin (LIPITOR) 80 MG tablet TAKE 1 TABLET BY MOUTH DAILY AT 6 PM.  30 tablet  3  . BRILINTA 90 MG TABS tablet TAKE 1 TABLET TWICE A DAY  60 tablet  3  . carvedilol (COREG) 6.25 MG tablet Take 1 tablet (6.25 mg total) by mouth 2 (two) times daily with a meal. Do not take this medication if your heart rate is less than 55 beats per minute. Discuss further with your primary doctor.  60 tablet  3  . citalopram (CELEXA) 20 MG tablet TAKE 1/2 TABLET BY MOUTH EVERY DAY  15 tablet  2  . diclofenac sodium (VOLTAREN) 1 % GEL Apply to right knee twice daily for one more week, and twice daily only as needed thereafter.  100 g  1  . Multiple Vitamin (MULTIVITAMIN WITH MINERALS) TABS Take 1 tablet by mouth daily.      . nitroGLYCERIN (NITROSTAT) 0.4 MG SL tablet Place 0.4 mg under the tongue every 5 (five) minutes as needed for chest pain.      . WELCHOL 625 MG tablet TAKE 2 TABLETS BY MOUTH 2 TIMES DAILY.  120 tablet  3  . acetaminophen (TYLENOL) 325 MG tablet Take 2 tablets (650 mg total) by mouth 3 (three) times daily as needed for pain.      Marland Kitchen thiamine 100 MG tablet Take 1 tablet (100 mg total) by mouth daily. B. vitamin.      . [DISCONTINUED] cloNIDine (CATAPRES) 0.2 MG tablet Take 0.2 mg by mouth 2 (two) times daily.        . [DISCONTINUED] metoprolol (TOPROL-XL) 50 MG 24 hr tablet Take 50 mg by mouth daily.        . [DISCONTINUED] niacin (NIASPAN) 500 MG CR tablet Take 500 mg by mouth at bedtime.        . [DISCONTINUED] omeprazole (PRILOSEC) 20 MG capsule 1 po every morning  30 capsule  5   No current facility-administered medications on file prior to visit.   No Known Allergies   ROS: See HPI for pertinent positives and negatives.  Physical Examination  Filed Vitals:   04/05/13 0914  BP: 155/88  Pulse: 53  Resp: 14   Filed Weights    04/05/13 0914  Weight: 170 lb (77.111 kg)   Body mass index is 21.25 kg/(m^2).  General: A&O x 3, WD, using walker.  Pulmonary: Sym exp, good air movt, CTAB, no rales, rhonchi, & wheezing.   Cardiac: RRR, Nl S1, S2, no Murmurs, rubs or gallops.  Vascular: Vessel Right Left  Radial Palpable Palpable  Carotid without bruit  without  bruit  Aorta Not palpable N/A  Femoral Palpable Palpable  Popliteal Not palpable Not palpable  PT notPalpable notPalpable  DP notPalpable notPalpable   Gastrointestinal: soft, NTND, -G/R, - HSM, - masses, - CVAT B.  Musculoskeletal: M/S 5/5 in UE's, 3/5 in LE's, Extremities without ischemic changes.  Neurologic: Pain and light touch intact in extremities, Motor exam as listed above. CN 2-12 intact.  Non-Invasive Vascular Imaging  EVAR Duplex (Date: 04/05/2013)  AAA sac size: 3.77 cm x 3.75 cam  no endoleak detected  EVAR Duplex (Date: 04/02/12)  AAA sac size: 3.28 cm x 3.25 cam  Medical Decision Making  [name] is a 68 y.o. male who presents s/p EVAR on 01/16/2008.  Pt is asymptomatic with increase in sac size.  I discussed with the patient the importance of surveillance of the endograft.  I discussed the Duplex results and exam with Dr. [name].  The next CTA will be scheduled for within the next month and follow up with Dr. [name].  I emphasized the importance of maximal medical management including strict control of blood pressure, blood glucose, and lipid levels, antiplatelet agents, obtaining regular exercise, and cessation of smoking.   The patient was counseled re smoking cessation.  The patient was given information about AAA including signs, symptoms, treatment, and how to minimize the risk of enlargement and rupture of aneurysms.    Thank you for allowing [place] to participate in this patient's care.  Clemon Chambers, RN, MSN, FNP-C [facility] [place] Office: [phone]  Clinic Physician:  [name]  04/05/2013, 9:12 AM

## 2013-04-05 NOTE — Patient Instructions (Addendum)
Abdominal Aortic Aneurysm An aneurysm is a weakened or damaged part of an artery wall that bulges from the normal force of blood pumping through the body. An abdominal aortic aneurysm is an aneurysm that occurs in the lower part of the aorta, the main artery of the body.  The major concern with an abdominal aortic aneurysm is that it can enlarge and burst (rupture) or blood can flow between the layers of the wall of the aorta through a tear (aorticdissection). Both of these conditions can cause bleeding inside the body and can be life threatening unless diagnosed and treated promptly. CAUSES  The exact cause of an abdominal aortic aneurysm is unknown. Some contributing factors are:   A hardening of the arteries caused by the buildup of fat and other substances in the lining of a blood vessel (arteriosclerosis).  Inflammation of the walls of an artery (arteritis).   Connective tissue diseases, such as Marfan syndrome.   Abdominal trauma.   An infection, such as syphilis or staphylococcus, in the wall of the aorta (infectious aortitis) caused by bacteria. RISK FACTORS  Risk factors that contribute to an abdominal aortic aneurysm may include:  Age older than 60 years.   High blood pressure (hypertension).  Male gender.  Ethnicity (white race).  Obesity.  Family history of aneurysm (first degree relatives only).  Tobacco use. PREVENTION  The following healthy lifestyle habits may help decrease your risk of abdominal aortic aneurysm:  Quitting smoking. Smoking can raise your blood pressure and cause arteriosclerosis.  Limiting or avoiding alcohol.  Keeping your blood pressure, blood sugar level, and cholesterol levels within normal limits.  Decreasing your salt intake. In somepeople, too much salt can raise blood pressure and increase your risk of abdominal aortic aneurysm.  Eating a diet low in saturated fats and cholesterol.  Increasing your fiber intake by including  whole grains, vegetables, and fruits in your diet. Eating these foods may help lower blood pressure.  Maintaining a healthy weight.  Staying physically active and exercising regularly. SYMPTOMS  The symptoms of abdominal aortic aneurysm may vary depending on the size and rate of growth of the aneurysm.Most grow slowly and do not have any symptoms. When symptoms do occur, they may include:  Pain (abdomen, side, lower back, or groin). The pain may vary in intensity. A sudden onset of severe pain may indicate that the aneurysm has ruptured.  Feeling full after eating only small amounts of food.  Nausea or vomiting or both.  Feeling a pulsating lump in the abdomen.  Feeling faint or passing out. DIAGNOSIS  Since most unruptured abdominal aortic aneurysms have no symptoms, they are often discovered during diagnostic exams for other conditions. An aneurysm may be found during the following procedures:  Ultrasonography (A one-time screening for abdominal aortic aneurysm by ultrasonography is also recommended for all men aged 65-75 years who have ever smoked).  X-ray exams.  A computed tomography (CT).  Magnetic resonance imaging (MRI).  Angiography or arteriography. TREATMENT  Treatment of an abdominal aortic aneurysm depends on the size of your aneurysm, your age, and risk factors for rupture. Medication to control blood pressure and pain may be used to manage aneurysms smaller than 6 cm. Regular monitoring for enlargement may be recommended by your caregiver if:  The aneurysm is 3 4 cm in size (an annual ultrasonography may be recommended).  The aneurysm is 4 4.5 cm in size (an ultrasonography every 6 months may be recommended).  The aneurysm is larger than 4.5   cm in size (your caregiver may ask that you be examined by a vascular surgeon). If your aneurysm is larger than 6 cm, surgical repair may be recommended. There are two main methods for repair of an aneurysm:   Endovascular  repair (a minimally invasive surgery). This is done most often.  Open repair. This method is used if an endovascular repair is not possible. Document Released: 12/15/2004 Document Revised: 07/02/2012 Document Reviewed: 04/06/2012 ExitCare Patient Information 2014 [employer].   Smoking Cessation Quitting smoking is important to your health and has many advantages. However, it is not always easy to quit since nicotine is a very addictive drug. Often times, people try 3 times or more before being able to quit. This document explains the best ways for you to prepare to quit smoking. Quitting takes hard work and a lot of effort, but you can do it. ADVANTAGES OF QUITTING SMOKING  You will live longer, feel better, and live better.  Your body will feel the impact of quitting smoking almost immediately.  Within 20 minutes, blood pressure decreases. Your pulse returns to its normal level.  After 8 hours, carbon monoxide levels in the blood return to normal. Your oxygen level increases.  After 24 hours, the chance of having a heart attack starts to decrease. Your breath, hair, and body stop smelling like smoke.  After 48 hours, damaged nerve endings begin to recover. Your sense of taste and smell improve.  After 72 hours, the body is virtually free of nicotine. Your bronchial tubes relax and breathing becomes easier.  After 2 to 12 weeks, lungs can hold more air. Exercise becomes easier and circulation improves.  The risk of having a heart attack, stroke, cancer, or lung disease is greatly reduced.  After 1 year, the risk of coronary heart disease is cut in half.  After 5 years, the risk of stroke falls to the same as a nonsmoker.  After 10 years, the risk of lung cancer is cut in half and the risk of other cancers decreases significantly.  After 15 years, the risk of coronary heart disease drops, usually to the level of a nonsmoker.  If you are pregnant, quitting smoking will improve  your chances of having a healthy baby.  The people you live with, especially any children, will be healthier.  You will have extra money to spend on things other than cigarettes. QUESTIONS TO THINK ABOUT BEFORE ATTEMPTING TO QUIT You may want to talk about your answers with your caregiver.  Why do you want to quit?  If you tried to quit in the past, what helped and what did not?  What will be the most difficult situations for you after you quit? How will you plan to handle them?  Who can help you through the tough times? Your family? Friends? A caregiver?  What pleasures do you get from smoking? What ways can you still get pleasure if you quit? Here are some questions to ask your caregiver:  How can you help me to be successful at quitting?  What medicine do you think would be best for me and how should I take it?  What should I do if I need more help?  What is smoking withdrawal like? How can I get information on withdrawal? GET READY  Set a quit date.  Change your environment by getting rid of all cigarettes, ashtrays, matches, and lighters in your home, car, or work. Do not let people smoke in your home.  Review your   past attempts to quit. Think about what worked and what did not. GET SUPPORT AND ENCOURAGEMENT You have a better chance of being successful if you have help. You can get support in many ways.  Tell your family, friends, and co-workers that you are going to quit and need their support. Ask them not to smoke around you.  Get individual, group, or telephone counseling and support. Programs are available at local hospitals and health centers. Call your local health department for information about programs in your area.  Spiritual beliefs and practices may help some smokers quit.  Download a "quit meter" on your computer to keep track of quit statistics, such as how long you have gone without smoking, cigarettes not smoked, and money saved.  Get a self-help  book about quitting smoking and staying off of tobacco. LEARN NEW SKILLS AND BEHAVIORS  Distract yourself from urges to smoke. Talk to someone, go for a walk, or occupy your time with a task.  Change your normal routine. Take a different route to work. Drink tea instead of coffee. Eat breakfast in a different place.  Reduce your stress. Take a hot bath, exercise, or read a book.  Plan something enjoyable to do every day. Reward yourself for not smoking.  Explore interactive web-based programs that specialize in helping you quit. GET MEDICINE AND USE IT CORRECTLY Medicines can help you stop smoking and decrease the urge to smoke. Combining medicine with the above behavioral methods and support can greatly increase your chances of successfully quitting smoking.  Nicotine replacement therapy helps deliver nicotine to your body without the negative effects and risks of smoking. Nicotine replacement therapy includes nicotine gum, lozenges, inhalers, nasal sprays, and skin patches. Some may be available over-the-counter and others require a prescription.  Antidepressant medicine helps people abstain from smoking, but how this works is unknown. This medicine is available by prescription.  Nicotinic receptor partial agonist medicine simulates the effect of nicotine in your brain. This medicine is available by prescription. Ask your caregiver for advice about which medicines to use and how to use them based on your health history. Your caregiver will tell you what side effects to look out for if you choose to be on a medicine or therapy. Carefully read the information on the package. Do not use any other product containing nicotine while using a nicotine replacement product.  RELAPSE OR DIFFICULT SITUATIONS Most relapses occur within the first 3 months after quitting. Do not be discouraged if you start smoking again. Remember, most people try several times before finally quitting. You may have symptoms  of withdrawal because your body is used to nicotine. You may crave cigarettes, be irritable, feel very hungry, cough often, get headaches, or have difficulty concentrating. The withdrawal symptoms are only temporary. They are strongest when you first quit, but they will go away within 10 14 days. To reduce the chances of relapse, try to:  Avoid drinking alcohol. Drinking lowers your chances of successfully quitting.  Reduce the amount of caffeine you consume. Once you quit smoking, the amount of caffeine in your body increases and can give you symptoms, such as a rapid heartbeat, sweating, and anxiety.  Avoid smokers because they can make you want to smoke.  Do not let weight gain distract you. Many smokers will gain weight when they quit, usually less than 10 pounds. Eat a healthy diet and stay active. You can always lose the weight gained after you quit.  Find ways to improve your   mood other than smoking. FOR MORE INFORMATION  www.smokefree.gov  Document Released: 03/01/2001 Document Revised: 09/06/2011 Document Reviewed: 06/16/2011 ExitCare Patient Information 2014 [employer].  

## 2013-04-08 ENCOUNTER — Other Ambulatory Visit (HOSPITAL_COMMUNITY): Payer: Medicare Other

## 2013-04-08 ENCOUNTER — Telehealth: Payer: Self-pay | Admitting: Surgery

## 2013-04-08 ENCOUNTER — Ambulatory Visit: Payer: Medicare Other | Admitting: Family

## 2013-04-08 NOTE — Telephone Encounter (Signed)
Spoke with patient's wife. Told her labs would need to be drawn on 04/17/13 at [facility] in [place] near [place]. CTA and Dr. [name] appointment are on 04/22/13. Lab will be faxed to [facility] in [place]. CTA letter will be mailed today. Patient's wife verbalized understanding - kf

## 2013-04-16 ENCOUNTER — Other Ambulatory Visit: Payer: Self-pay | Admitting: Surgery

## 2013-04-16 LAB — CREATININE, SERUM: Creat: 1.93 mg/dL — ABNORMAL HIGH (ref 0.50–1.35)

## 2013-04-16 LAB — BUN: BUN: 16 mg/dL (ref 6–23)

## 2013-04-18 ENCOUNTER — Other Ambulatory Visit: Payer: Self-pay | Admitting: Family Medicine

## 2013-04-19 ENCOUNTER — Encounter: Payer: Self-pay | Admitting: Surgery

## 2013-04-22 ENCOUNTER — Encounter: Payer: Self-pay | Admitting: Surgery

## 2013-04-22 ENCOUNTER — Ambulatory Visit (INDEPENDENT_AMBULATORY_CARE_PROVIDER_SITE_OTHER): Payer: Medicare Other | Admitting: Surgery

## 2013-04-22 ENCOUNTER — Ambulatory Visit
Admission: RE | Admit: 2013-04-22 | Discharge: 2013-04-22 | Disposition: A | Discharge status: Home / Self Care | Payer: Medicare Other | Source: Ambulatory Visit | Attending: Family | Admitting: Family

## 2013-04-22 VITALS — BP 167/81 | HR 48 | Resp 16 | Ht 75.0 in | Wt 163.4 lb

## 2013-04-22 DIAGNOSIS — I714 Abdominal aortic aneurysm, without rupture, unspecified: Secondary | ICD-10-CM

## 2013-04-22 DIAGNOSIS — Z48812 Encounter for surgical aftercare following surgery on the circulatory system: Secondary | ICD-10-CM

## 2013-04-22 MED ORDER — IOHEXOL 350 MG/ML SOLN
60.0000 mL | Freq: Once | INTRAVENOUS | Status: AC | PRN
  Administered 2013-04-22: 60 mL via INTRAVENOUS

## 2013-04-22 NOTE — Progress Notes (Signed)
Patient name: [name] MRN: [id number] DOB: 1945-05-21 Sex: male     Chief Complaint  Patient presents with  . AAA    CTA abdomen/pelvis 04-22-2013  . Follow-up    HISTORY OF PRESENT ILLNESS: The patient is status post endovascular aneurysm repair on 01/16/2008 using a Cook graft.  His ultrasound in mid January suggested a slight increase in the size of his aneurysm sac, going from 3.2-3.7.  This was discussed with Dr. [name] and a CT angiogram was performed.  The patient is back today to discuss these results.  He denies any abdominal pain or back pain.  Past Medical History  Diagnosis Date  . Hypertension   . COPD (chronic obstructive pulmonary disease)   . Hyperlipidemia   . Cardiomyopathy     EF of 30% per echo in June of 2012; admitted with congestive heart failure; nonobstructive CAD on cath in 08/2010  . History of noncompliance with medical treatment   . Cerebrovascular disease 2011    CVA  in 02/2010-only deficit is decreased vision in  right eye  . Alcohol abuse   . Tobacco abuse     40 pack years  . Abdominal aortic aneurysm     Stent graft repair  . Stroke 2011    decreased vision of right eye  . Chronic systolic CHF (congestive heart failure)   . Leg pain   . CKD (chronic kidney disease) stage 3, GFR 30-59 ml/min     creatinine-1.7 in 08/2010  . CAD (coronary artery disease)   . Myocardial infarction acute 10/01/11  . CKD (chronic kidney disease), stage III 12/06/2012  . Effusion of right knee joint 12/06/2012  . Anemia   . Atrial fibrillation     Past Surgical History  Procedure Laterality Date  . Colonoscopy w/ polypectomy  2006    Dr. Tamala Julian: anal fissure, sessile adenomatous polyp, diverticulosis  . Esophagogastroduodenoscopy  11/15/2010    Procedure: ESOPHAGOGASTRODUODENOSCOPY (EGD);  Surgeon: Dorothyann Peng, MD;  Location: [facility];  Service: Endoscopy;  Laterality: N/A;  with propofol sedation; procedure start @ 0813  . Flexible sigmoidoscopy   11/15/2010    Procedure: FLEXIBLE SIGMOIDOSCOPY;  Surgeon: Dorothyann Peng, MD;  Location: [facility];  Service: Endoscopy;  Laterality: N/A;  with propofol sedation; ended @ 0808  . Polypectomy  12/13/2010    Procedure: POLYPECTOMY;  Surgeon: Dorothyann Peng, MD;  Location: [facility];  Service: Endoscopy;  Laterality: N/A;  right colon, cecal, transverse, and sigmoid polypectomy  . Cardiac catheterization  10/04/11    Normal left main, 95% pLAD stenosis with ulceration s/p successful BMS placement, 30% segmental plaque beyond this, dLAD after D2 with 50% plaquing, then focal 70% lesion, 80% focal short D2 stenosis, 30% pOM2 lesion, 30-40% mOM3 stenosis, Shephard's crook region of RCA with 50% lesion, mRCA 50-60% lesion and mild dRCA irregularities.   . Coronary stent placement  10/04/11  . Abdominal aortic aneurysm repair w/ endoluminal graft      01/16/2008    History   Social History  . Marital Status: Married    Spouse Name: N/A    Number of Children: N/A  . Years of Education: N/A   Occupational History  . Not on file.   Social History Main Topics  . Smoking status: Current Every Day Smoker -- 0.50 packs/day for 50 years    Types: Cigarettes  . Smokeless tobacco: Never Used  . Alcohol Use: 16.8 oz/week    28 Shots  of liquor per week     Comment: quit about 1 month ago, prior to this would drink about 7 oz of liquor per day 10/2012  . Drug Use: No  . Sexual Activity: No   Other Topics Concern  . Not on file   Social History Narrative  . No narrative on file    Family History  Problem Relation Age of Onset  . Colon cancer Neg Hx   . Anesthesia problems Neg Hx   . Hypotension Neg Hx   . Malignant hyperthermia Neg Hx   . Pseudochol deficiency Neg Hx   . Cancer Mother   . Heart disease Mother     before age 12  . Hyperlipidemia Mother   . Hypertension Mother     Allergies as of 04/22/2013  . (No Known Allergies)    Current Outpatient Prescriptions on File Prior to Visit    Medication Sig Dispense Refill  . acetaminophen (TYLENOL) 325 MG tablet Take 2 tablets (650 mg total) by mouth 3 (three) times daily as needed for pain.      Marland Kitchen amLODipine (NORVASC) 5 MG tablet TAKE 1 TABLET EVERY DAY  30 tablet  3  . aspirin 81 MG tablet Take 81 mg by mouth daily.      Marland Kitchen atorvastatin (LIPITOR) 80 MG tablet TAKE 1 TABLET BY MOUTH DAILY AT 6 PM.  30 tablet  3  . BRILINTA 90 MG TABS tablet TAKE 1 TABLET TWICE A DAY  60 tablet  3  . carvedilol (COREG) 6.25 MG tablet Take 1 tablet (6.25 mg total) by mouth 2 (two) times daily with a meal. Do not take this medication if your heart rate is less than 55 beats per minute. Discuss further with your primary doctor.  60 tablet  3  . citalopram (CELEXA) 20 MG tablet TAKE 1/2 TABLET BY MOUTH EVERY DAY  15 tablet  2  . diclofenac sodium (VOLTAREN) 1 % GEL Apply to right knee twice daily for one more week, and twice daily only as needed thereafter.  100 g  1  . Multiple Vitamin (MULTIVITAMIN WITH MINERALS) TABS Take 1 tablet by mouth daily.      . nitroGLYCERIN (NITROSTAT) 0.4 MG SL tablet Place 0.4 mg under the tongue every 5 (five) minutes as needed for chest pain.      Marland Kitchen thiamine 100 MG tablet Take 1 tablet (100 mg total) by mouth daily. B. vitamin.      . WELCHOL 625 MG tablet TAKE 2 TABLETS BY MOUTH 2 TIMES DAILY.  120 tablet  3  . [DISCONTINUED] cloNIDine (CATAPRES) 0.2 MG tablet Take 0.2 mg by mouth 2 (two) times daily.        . [DISCONTINUED] metoprolol (TOPROL-XL) 50 MG 24 hr tablet Take 50 mg by mouth daily.        . [DISCONTINUED] niacin (NIASPAN) 500 MG CR tablet Take 500 mg by mouth at bedtime.        . [DISCONTINUED] omeprazole (PRILOSEC) 20 MG capsule 1 po every morning  30 capsule  5   No current facility-administered medications on file prior to visit.     REVIEW OF SYSTEMS: No changes from prior visit  PHYSICAL EXAMINATION:   Vital signs are BP 167/81  Pulse 48  Resp 16  Ht 6\' 3"  (1.905 m)  Wt 163 lb 6.4 oz (74.118  kg)  BMI 20.42 kg/m2 General: The patient appears their stated age. HEENT:  No gross abnormalities Pulmonary:  Non labored  breathing Abdomen: Soft and non-tender.  Aorta is not palpable. Musculoskeletal: There are no major deformities. Neurologic: No focal weakness or paresthesias are detected, Skin: There are no ulcer or rashes noted. Psychiatric: The patient has normal affect. Cardiovascular: There is a regular rate and rhythm without significant murmur appreciated.   Diagnostic Studies I have reviewed his CT angiogram.  This shows a stable aneurysm sac.  Diameter measurements are 4.1 x 2.8.  There is no evidence of endoleak.  There has been a slight increase in the size of the right common iliac artery.  This has increased from 2.3 up to 2.6 when compared to his study in June of 2010  Assessment: Status post -- aneurysm repair Plan: No evidence of complicating features on CT scan.  I will need to pay attention to the dilated right common iliac artery on future studies.  I have scheduled the patient to come back in one year with an abdominal ultrasound  V. Leia Alf, M.D. [facility] Office: [phone] Pager:  [phone]

## 2013-04-22 NOTE — Addendum Note (Signed)
Addended by: Mena Goes on: 04/22/2013 05:20 PM   Modules accepted: Orders

## 2013-05-02 ENCOUNTER — Encounter: Payer: Self-pay | Admitting: Family Medicine

## 2013-05-02 ENCOUNTER — Ambulatory Visit (INDEPENDENT_AMBULATORY_CARE_PROVIDER_SITE_OTHER): Payer: Medicare Other | Admitting: Family Medicine

## 2013-05-02 VITALS — BP 132/80 | Ht 75.0 in

## 2013-05-02 DIAGNOSIS — Z125 Encounter for screening for malignant neoplasm of prostate: Secondary | ICD-10-CM

## 2013-05-02 DIAGNOSIS — J449 Chronic obstructive pulmonary disease, unspecified: Secondary | ICD-10-CM

## 2013-05-02 DIAGNOSIS — Z79899 Other long term (current) drug therapy: Secondary | ICD-10-CM

## 2013-05-02 DIAGNOSIS — N189 Chronic kidney disease, unspecified: Secondary | ICD-10-CM

## 2013-05-02 DIAGNOSIS — I119 Hypertensive heart disease without heart failure: Secondary | ICD-10-CM

## 2013-05-02 DIAGNOSIS — E785 Hyperlipidemia, unspecified: Secondary | ICD-10-CM

## 2013-05-02 DIAGNOSIS — D649 Anemia, unspecified: Secondary | ICD-10-CM

## 2013-05-02 DIAGNOSIS — I679 Cerebrovascular disease, unspecified: Secondary | ICD-10-CM

## 2013-05-02 NOTE — Progress Notes (Signed)
   Subjective:    Patient ID: [name], male    DOB: July 16, 1945, 68 y.o.   MRN: [id number]  HPI Patient is here today for a f/u on BP from 11/11.  He has no concerns. But upon further discussion he had multiple areas that needed to be covered. HTN-he relates he takes his medicine as directed he tries to eat heart healthy diet. Patient does have some difficulty listing out what his medicines are he denies any chest tightness pressure pain shortness breath Hyperlip-he does relate he takes his medicine he does admit to eating some foods are probably not good for his cholesterol. He is not really able to do a lot of exercise. Stroke hx-he has noticed that his memory is not quite as good as it should be he relates his wife and family he drives him around. He feels that he can still represent himself well No ETOH-he relates that he is staying away from alcohol he states he is doing a good job watching this. Moods good, he denies being depressed.    Review of Systems  Constitutional: Positive for fatigue. Negative for fever, activity change and appetite change.  HENT: Negative for congestion and rhinorrhea.   Eyes: Negative for discharge.  Respiratory: Negative for cough and wheezing.   Cardiovascular: Negative for chest pain.  Gastrointestinal: Negative for vomiting, abdominal pain and blood in stool.  Genitourinary: Negative for frequency and difficulty urinating.  Musculoskeletal: Negative for neck pain.  Skin: Negative for rash.  Allergic/Immunologic: Negative for environmental allergies and food allergies.  Neurological: Negative for weakness and headaches.  Psychiatric/Behavioral: Negative for agitation.       Objective:   Physical Exam  Constitutional: He appears well-developed and well-nourished.  HENT:  Head: Normocephalic and atraumatic.  Right Ear: External ear normal.  Left Ear: External ear normal.  Nose: Nose normal.  Mouth/Throat: Oropharynx is clear and moist.    Eyes: EOM are normal. Pupils are equal, round, and reactive to light.  Neck: Normal range of motion. Neck supple. No thyromegaly present.  Cardiovascular: Normal rate, regular rhythm and normal heart sounds.   No murmur heard. Pulmonary/Chest: Effort normal and breath sounds normal. No respiratory distress. He has no wheezes.  Abdominal: Soft. Bowel sounds are normal. He exhibits no distension and no mass. There is no tenderness.  Genitourinary: Penis normal.  Musculoskeletal: Normal range of motion. He exhibits no edema.  Lymphadenopathy:    He has no cervical adenopathy.  Neurological: He is alert. He exhibits normal muscle tone.  Skin: Skin is warm and dry. No erythema.  Psychiatric: He has a normal mood and affect. His behavior is normal. Judgment normal.    Patient's instantaneous recall is 3 for 3 after drawing a clock his recall is a 1 for 3.      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 Could easily get worse over time. I do not feel this is Alzheimer's I don't feel medication will help that  HTN-continue current medication blood pressure overall is good  Hyperlipidemia continue current medication watch diet closely  Patient was talked to at length again about staying away from smoking and staying away from alcohol.  Patient has chronic renal issues. He was advised to minimize and avoid excessive protein in today  He does have some weakness he was advised to do some walking on regular basis to try to strengthen himself.  See the patient back in 3-4 months

## 2013-05-20 LAB — CBC WITH DIFFERENTIAL/PLATELET
Basophils Absolute: 0.1 10*3/uL (ref 0.0–0.1)
Basophils Relative: 1 % (ref 0–1)
EOS PCT: 4 % (ref 0–5)
Eosinophils Absolute: 0.2 10*3/uL (ref 0.0–0.7)
HEMATOCRIT: 39.3 % (ref 39.0–52.0)
HEMOGLOBIN: 13.3 g/dL (ref 13.0–17.0)
LYMPHS ABS: 1.7 10*3/uL (ref 0.7–4.0)
Lymphocytes Relative: 30 % (ref 12–46)
MCH: 31.8 pg (ref 26.0–34.0)
MCHC: 33.8 g/dL (ref 30.0–36.0)
MCV: 94 fL (ref 78.0–100.0)
Monocytes Absolute: 0.6 10*3/uL (ref 0.1–1.0)
Monocytes Relative: 11 % (ref 3–12)
NEUTROS PCT: 54 % (ref 43–77)
Neutro Abs: 3 10*3/uL (ref 1.7–7.7)
Platelets: 153 10*3/uL (ref 150–400)
RBC: 4.18 MIL/uL — AB (ref 4.22–5.81)
RDW: 15.4 % (ref 11.5–15.5)
WBC: 5.5 10*3/uL (ref 4.0–10.5)

## 2013-05-20 LAB — HEPATIC FUNCTION PANEL
ALT: 11 U/L (ref 0–53)
AST: 18 U/L (ref 0–37)
Albumin: 3.9 g/dL (ref 3.5–5.2)
Alkaline Phosphatase: 88 U/L (ref 39–117)
Bilirubin, Direct: 0.1 mg/dL (ref 0.0–0.3)
Indirect Bilirubin: 0.6 mg/dL (ref 0.2–1.2)
Total Bilirubin: 0.7 mg/dL (ref 0.2–1.2)
Total Protein: 6.7 g/dL (ref 6.0–8.3)

## 2013-05-20 LAB — BASIC METABOLIC PANEL
BUN: 18 mg/dL (ref 6–23)
CHLORIDE: 108 meq/L (ref 96–112)
CO2: 24 mEq/L (ref 19–32)
Calcium: 9.3 mg/dL (ref 8.4–10.5)
Creat: 2.1 mg/dL — ABNORMAL HIGH (ref 0.50–1.35)
GLUCOSE: 86 mg/dL (ref 70–99)
POTASSIUM: 5.2 meq/L (ref 3.5–5.3)
SODIUM: 140 meq/L (ref 135–145)

## 2013-05-20 LAB — LIPID PANEL
CHOL/HDL RATIO: 3.3 ratio
CHOLESTEROL: 162 mg/dL (ref 0–200)
HDL: 49 mg/dL (ref >39)
LDL Cholesterol: 97 mg/dL (ref 0–99)
Triglycerides: 80 mg/dL (ref <150)
VLDL: 16 mg/dL (ref 0–40)

## 2013-05-21 ENCOUNTER — Encounter: Payer: Self-pay | Admitting: Family Medicine

## 2013-05-21 LAB — PSA: PSA: 1.51 ng/mL (ref <=4.00)

## 2013-07-10 ENCOUNTER — Other Ambulatory Visit: Payer: Self-pay | Admitting: Family Medicine

## 2013-07-11 ENCOUNTER — Other Ambulatory Visit: Payer: Self-pay

## 2013-07-11 ENCOUNTER — Telehealth: Payer: Self-pay | Admitting: *Deleted

## 2013-07-11 MED ORDER — CARVEDILOL 6.25 MG PO TABS: 6.2500 mg | ORAL_TABLET | Freq: Two times a day (BID) | ORAL | Status: DC

## 2013-07-11 MED ORDER — ATORVASTATIN CALCIUM 80 MG PO TABS: 80.0000 mg | ORAL_TABLET | Freq: Every day | ORAL | Status: DC

## 2013-07-11 NOTE — Telephone Encounter (Signed)
Refills complete 

## 2013-07-11 NOTE — Telephone Encounter (Signed)
PT NEEDS ATORVASTATIN, CARVEDILOL CALLED IN TO [facility] IN [place].

## 2013-07-30 ENCOUNTER — Ambulatory Visit (INDEPENDENT_AMBULATORY_CARE_PROVIDER_SITE_OTHER): Payer: Medicare Other | Admitting: Family Medicine

## 2013-07-30 ENCOUNTER — Encounter: Payer: Self-pay | Admitting: Family Medicine

## 2013-07-30 VITALS — BP 136/70 | Ht 75.0 in | Wt 166.0 lb

## 2013-07-30 DIAGNOSIS — D649 Anemia, unspecified: Secondary | ICD-10-CM

## 2013-07-30 DIAGNOSIS — I119 Hypertensive heart disease without heart failure: Secondary | ICD-10-CM

## 2013-07-30 DIAGNOSIS — F17200 Nicotine dependence, unspecified, uncomplicated: Secondary | ICD-10-CM

## 2013-07-30 DIAGNOSIS — J449 Chronic obstructive pulmonary disease, unspecified: Secondary | ICD-10-CM

## 2013-07-30 DIAGNOSIS — Z720 Tobacco use: Secondary | ICD-10-CM

## 2013-07-30 DIAGNOSIS — N189 Chronic kidney disease, unspecified: Secondary | ICD-10-CM

## 2013-07-30 DIAGNOSIS — E785 Hyperlipidemia, unspecified: Secondary | ICD-10-CM

## 2013-07-30 NOTE — Patient Instructions (Signed)
wellchol- unlikely that it is needed. Labs look great. Ask cardiology if you can stop this medicine. ( I will send a message to them)

## 2013-07-30 NOTE — Progress Notes (Signed)
   Subjective:    Patient ID: [name], male    DOB: 01-19-1946, 68 y.o.   MRN: [id number]  Hypertension This is a chronic problem. The current episode started more than 1 year ago. The problem has been gradually improving since onset. The problem is controlled. Associated symptoms include shortness of breath. Pertinent negatives include no chest pain. There are no associated agents to hypertension. There are no known risk factors for coronary artery disease. Treatments tried: amlodipine. The current treatment provides significant improvement. There are no compliance problems.    patient denies any rectal bleeding denies any hematuria. He denies any strokes or symptoms of strokes currently. States his thinking is doing all right. States his wife helps him with his medications and helps him with household chores. This patient is pretty much dependent upon her.  He understands the importance of a healthy diet he understands the importance of staying away from alcohol he also understands the importance of quitting smoking. Patient states he has no other concerns at this time.    Review of Systems  Constitutional: Positive for fatigue. Negative for fever, diaphoresis, activity change and appetite change.  HENT: Negative for congestion.   Respiratory: Positive for shortness of breath. Negative for cough.   Cardiovascular: Negative for chest pain.  Gastrointestinal: Negative for abdominal pain.  Endocrine: Negative for polydipsia and polyphagia.  Genitourinary: Negative for difficulty urinating.  Neurological: Negative for weakness.  Psychiatric/Behavioral: Negative for confusion.       Objective:   Physical Exam  Vitals reviewed. Constitutional: He appears well-nourished. No distress.  Cardiovascular: Normal rate, regular rhythm and normal heart sounds.   No murmur heard. Pulmonary/Chest: Effort normal and breath sounds normal. No respiratory distress.  Abdominal: Soft. There is no  tenderness.  Musculoskeletal: He exhibits no edema.  This patient has muscle wasting. He also has some skin changes from her artery issues/circulation issues in his legs  Lymphadenopathy:    He has no cervical adenopathy.  Neurological: He is alert.  Psychiatric: His behavior is normal.          Assessment & Plan:  1. Hypertensive heart disease without CHF Blood pressure overall good currently on recheck it was good lungs were clear no sinus CHF going on extremities no edema. Continue current medications. Patient denies anginal symptoms  2. Chronic kidney disease His kidney disease is stable no need to repeat lab work currently low protein diet avoid excessive fluids avoid anti-inflammatories. I do not feel ACE inhibitor as would be wise given his creatinine.  3. Hyperlipidemia Lipid control is good continue current measures. I do not feel that this patient needs to continue WelChol. This medication is costly to him plus his triglycerides look good currently.  4. Tobacco abuse Patient was counseled to quit smoking altogether.  5. Anemia Patient has a history of anemia get hemoglobin now looks good  6. Chronic obstructive pulmonary disease He has COPD from smoking he states his breathing is doing good for his level of activity I don't feel that putting him through pulmonary function testing or adding inhalers would improve his quality of life  Patient is to followup again in the fall

## 2013-07-31 ENCOUNTER — Ambulatory Visit (INDEPENDENT_AMBULATORY_CARE_PROVIDER_SITE_OTHER): Payer: Medicare Other | Admitting: Adult Health

## 2013-07-31 ENCOUNTER — Encounter: Payer: Self-pay | Admitting: Adult Health

## 2013-07-31 VITALS — BP 138/68 | HR 68 | Ht 75.0 in | Wt 165.0 lb

## 2013-07-31 DIAGNOSIS — I119 Hypertensive heart disease without heart failure: Secondary | ICD-10-CM

## 2013-07-31 DIAGNOSIS — E785 Hyperlipidemia, unspecified: Secondary | ICD-10-CM

## 2013-07-31 DIAGNOSIS — I214 Non-ST elevation (NSTEMI) myocardial infarction: Secondary | ICD-10-CM

## 2013-07-31 NOTE — Assessment & Plan Note (Signed)
Blood pressure is well controlled. Will continue him on current regimen. No changes or labs planned.

## 2013-07-31 NOTE — Progress Notes (Deleted)
Name: [name]    DOB: 11-24-1945  Age: 68 y.o.  MR#: [id number]       PCP:  [name], MD      Insurance: Payor: [place] / Plan: BLUE MEDICARE / Product Type: *No Product type* /   CC:    Chief Complaint  Patient presents with  . Hypertension  . Coronary Artery Disease    VS Filed Vitals:   07/31/13 1317  BP: 138/68  Pulse: 68  Height: 6\' 3"  (1.905 m)  Weight: 165 lb (74.844 kg)    Weights Current Weight  07/31/13 165 lb (74.844 kg)  07/30/13 166 lb (75.297 kg)  04/22/13 163 lb 6.4 oz (74.118 kg)    Blood Pressure  BP Readings from Last 3 Encounters:  07/31/13 138/68  07/30/13 136/70  05/02/13 132/80     Admit date:  (Not on file) Last encounter with RMR:  03/18/2013   Allergy Review of patient's allergies indicates no known allergies.  Current Outpatient Prescriptions  Medication Sig Dispense Refill  . acetaminophen (TYLENOL) 325 MG tablet Take 2 tablets (650 mg total) by mouth 3 (three) times daily as needed for pain.      Marland Kitchen amLODipine (NORVASC) 5 MG tablet TAKE 1 TABLET EVERY DAY  30 tablet  3  . aspirin 81 MG tablet Take 81 mg by mouth daily.      Marland Kitchen atorvastatin (LIPITOR) 80 MG tablet Take 1 tablet (80 mg total) by mouth daily at 6 PM.  90 tablet  3  . BRILINTA 90 MG TABS tablet TAKE 1 TABLET TWICE A DAY  60 tablet  3  . carvedilol (COREG) 6.25 MG tablet Take 1 tablet (6.25 mg total) by mouth 2 (two) times daily with a meal. Do not take this medication if your heart rate is less than 55 beats per minute. Discuss further with your primary doctor.  180 tablet  3  . citalopram (CELEXA) 20 MG tablet TAKE 1/2 TABLET BY MOUTH EVERY DAY  15 tablet  2  . diclofenac sodium (VOLTAREN) 1 % GEL Apply to right knee twice daily for one more week, and twice daily only as needed thereafter.  100 g  1  . Multiple Vitamin (MULTIVITAMIN WITH MINERALS) TABS Take 1 tablet by mouth daily.      . nitroGLYCERIN (NITROSTAT) 0.4 MG SL tablet Place 0.4  mg under the tongue every 5 (five) minutes as needed for chest pain.      Marland Kitchen thiamine 100 MG tablet Take 1 tablet (100 mg total) by mouth daily. B. vitamin.      . WELCHOL 625 MG tablet TAKE 2 TABLETS BY MOUTH 2 TIMES DAILY.  120 tablet  3  . [DISCONTINUED] cloNIDine (CATAPRES) 0.2 MG tablet Take 0.2 mg by mouth 2 (two) times daily.        . [DISCONTINUED] metoprolol (TOPROL-XL) 50 MG 24 hr tablet Take 50 mg by mouth daily.        . [DISCONTINUED] niacin (NIASPAN) 500 MG CR tablet Take 500 mg by mouth at bedtime.        . [DISCONTINUED] omeprazole (PRILOSEC) 20 MG capsule 1 po every morning  30 capsule  5   No current facility-administered medications for this visit.    Discontinued Meds:   There are no discontinued medications.  Patient Active Problem List   Diagnosis Date Noted  . Aftercare following surgery of the circulatory system, [place] 04/05/2013  . Anemia 12/08/2012  .  Protein-calorie malnutrition, severe 12/06/2012  . CKD (chronic kidney disease), stage III 12/06/2012  . Thrombocytopenia, unspecified 12/06/2012  . Fall at home 12/06/2012  . Acute bronchitis 12/06/2012  . UTI (urinary tract infection) 12/06/2012  . Effusion of right knee joint 12/06/2012  . Altered mental status 12/05/2012  . Vascular dementia 12/05/2012  . CAD (coronary artery disease) 10/07/2011  . Chronic systolic CHF (congestive heart failure) 10/07/2011  . Acute respiratory disease 10/03/2011  . Non-STEMI (non-ST elevated myocardial infarction) 10/02/2011  . Hypokalemia 10/02/2011  . Abdominal aneurysm without mention of rupture 04/04/2011  . History of noncompliance with medical treatment   . Cerebrovascular disease   . Adenomatous polyp 10/19/2010  . Hypertensive heart disease without CHF   . Hyperlipidemia   . Chronic kidney disease   . Alcohol abuse   . Tobacco abuse   . Chronic obstructive pulmonary disease 02/09/2010  . History of  abdominal aortic aneurysm (stent graft)     LABS     Component Value Date/Time   NA 140 05/20/2013 1117   NA 138 12/08/2012 0617   NA 138 12/07/2012 0505   K 5.2 05/20/2013 1117   K 3.8 12/08/2012 0617   K 3.9 12/07/2012 0505   CL 108 05/20/2013 1117   CL 105 12/08/2012 0617   CL 108 12/07/2012 0505   CO2 24 05/20/2013 1117   CO2 24 12/08/2012 0617   CO2 22 12/07/2012 0505   GLUCOSE 86 05/20/2013 1117   GLUCOSE 92 12/08/2012 0617   GLUCOSE 91 12/07/2012 0505   BUN 18 05/20/2013 1117   BUN 16 04/16/2013 1154   BUN 15 12/08/2012 0617   CREATININE 2.10* 05/20/2013 1117   CREATININE 1.93* 04/16/2013 1154   CREATININE 1.60* 12/08/2012 0617   CREATININE 1.69* 12/07/2012 0505   CREATININE 1.88* 12/06/2012 0456   CREATININE 1.59* 10/03/2012 1341   CALCIUM 9.3 05/20/2013 1117   CALCIUM 9.2 12/08/2012 0617   CALCIUM 9.0 12/07/2012 0505   GFRNONAA 43* 12/08/2012 0617   GFRNONAA 40* 12/07/2012 0505   GFRNONAA 35* 12/06/2012 0456   GFRAA 49* 12/08/2012 0617   GFRAA 46* 12/07/2012 0505   GFRAA 41* 12/06/2012 0456   CMP     Component Value Date/Time   NA 140 05/20/2013 1117   K 5.2 05/20/2013 1117   CL 108 05/20/2013 1117   CO2 24 05/20/2013 1117   GLUCOSE 86 05/20/2013 1117   BUN 18 05/20/2013 1117   CREATININE 2.10* 05/20/2013 1117   CREATININE 1.60* 12/08/2012 0617   CALCIUM 9.3 05/20/2013 1117   PROT 6.7 05/20/2013 1117   ALBUMIN 3.9 05/20/2013 1117   AST 18 05/20/2013 1117   ALT 11 05/20/2013 1117   ALKPHOS 88 05/20/2013 1117   BILITOT 0.7 05/20/2013 1117   GFRNONAA 43* 12/08/2012 0617   GFRAA 49* 12/08/2012 0617       Component Value Date/Time   WBC 5.5 05/20/2013 1117   WBC 5.2 12/08/2012 0617   WBC 6.4 12/07/2012 0505   HGB 13.3 05/20/2013 1117   HGB 11.1* 12/08/2012 0617   HGB 11.1* 12/07/2012 0505   HCT 39.3 05/20/2013 1117   HCT 32.3* 12/08/2012 0617   HCT 31.6* 12/07/2012 0505   MCV 94.0 05/20/2013 1117   MCV 92.0 12/08/2012 0617   MCV 92.7 12/07/2012 0505    Lipid Panel     Component Value Date/Time   CHOL 162 05/20/2013 1117   TRIG 80 05/20/2013 1117   HDL 49 05/20/2013 1117    CHOLHDL 3.3  05/20/2013 1117   VLDL 16 05/20/2013 1117   LDLCALC 97 05/20/2013 1117    ABG    Component Value Date/Time   PHART 7.241* 07/02/2008 0600   PCO2ART 48.0* 07/02/2008 0600   PO2ART 58.4* 07/02/2008 0600   HCO3 19.9* 07/02/2008 0600   TCO2 17.4 07/02/2008 0600   ACIDBASEDEF 6.2* 07/02/2008 0600   O2SAT 84.2 07/02/2008 0600     Lab Results  Component Value Date   TSH 2.931 12/07/2012   BNP (last 3 results)  Recent Labs  12/06/12 0456  PROBNP 898.6*   Cardiac Panel (last 3 results) No results found for this basename: CKTOTAL, CKMB, TROPONINI, RELINDX,  in the last 72 hours  Iron/TIBC/Ferritin    Component Value Date/Time   IRON 27* 12/07/2012 0505   TIBC 158* 12/07/2012 0505     EKG Orders placed during the hospital encounter of 12/05/12  . ED EKG  . ED EKG  . EKG 12-LEAD  . EKG 12-LEAD  . EKG     Prior Assessment and Plan Problem List as of 07/31/2013     Cardiovascular and Mediastinum   Hypertensive heart disease without CHF   Last Assessment & Plan   02/01/2013 Office Visit Written 02/01/2013  2:51 PM by Lendon Colonel, NP     Blood pressure is elevated today on this visit. This is the first non-ST elevated. He is medically compliant. We will not make any changes on his medicines today. Continue to follow him every 6 months. He will need to be est. with cardiologist, Dr. Domenic Polite in the absence of Dr. Lattie Haw.    History of  abdominal aortic aneurysm (stent graft)   Last Assessment & Plan   11/18/2010 Office Visit Written 11/18/2010  4:30 PM by Yehuda Savannah, MD     He is followed by Dr. [name] with an appointment scheduled for early next year.    Cerebrovascular disease   Abdominal aneurysm without mention of rupture   Non-STEMI (non-ST elevated myocardial infarction)   Last Assessment & Plan   10/21/2011 Office Visit Written 10/21/2011  1:26 PM by Lendon Colonel, NP     He has had a recent admission with PCI of the LAD using a bare-metal stent on  10/05/2011. He will continue on Alimta aspirin and current medication regimen. He was not found to be a candidate for ACE inhibitor or ARB in the setting of chronic kidney disease stage III. His GFR was 30-59 mL a minute her discharge summary report. I have referred him to cardiac rehabilitation for assistance in motivation of healthy lifestyle, exercise, and socialization which may help him with his apparent depression.    CAD (coronary artery disease)   Last Assessment & Plan   02/01/2013 Office Visit Written 02/01/2013  2:52 PM by Lendon Colonel, NP     We will continue current medication regimen. He is tolerating Brilinta, and aspirin. He is able to afford the medicine. No bleeding issues. We will see him again in 6 months. He is advised to come to see [place] sooner should he become symptomatic. He will be est. with Dr. Domenic Polite.    Chronic systolic CHF (congestive heart failure)     Respiratory   Chronic obstructive pulmonary disease   Last Assessment & Plan   09/20/2010 Office Visit Written 09/20/2010  4:42 PM by Lendon Colonel, NP     He is advised to quit smoking.  He verbalizes understanding.  Does not plan on quitting anytime soon.  Acute respiratory disease   Acute bronchitis     Digestive   Adenomatous polyp   Last Assessment & Plan   11/18/2010 Office Visit Written 11/18/2010 11:09 AM by Yehuda Savannah, MD     Polypectomy in 2006      Nervous and Auditory   Vascular dementia     Musculoskeletal and Integument   Effusion of right knee joint     Genitourinary   Chronic kidney disease   Last Assessment & Plan   11/18/2010 Office Visit Edited 11/24/2010  6:13 AM by Yehuda Savannah, MD     Renal dysfunction has been somewhat progressive and will require ongoing monitoring.  Metabolic profiles will be obtained in 1 and 3 months.  I will reassess this nice gentleman in 7 months.    CKD (chronic kidney disease), stage III   UTI (urinary tract infection)     Other    Hyperlipidemia   Last Assessment & Plan   02/01/2013 Office Visit Written 02/01/2013  2:54 PM by Lendon Colonel, NP     Recently checked 4 months ago by primary care physician. He will continue current medication regimen.    Alcohol abuse   Last Assessment & Plan   11/18/2010 Office Visit Written 11/18/2010  7:42 PM by Yehuda Savannah, MD     Abstinent for at least 10 years.    Tobacco abuse   Last Assessment & Plan   11/18/2010 Office Visit Written 11/18/2010  7:52 PM by Yehuda Savannah, MD     Complete discontinuation of tobacco use as advised.  Patient agrees to make the attempt.    History of noncompliance with medical treatment   Hypokalemia   Altered mental status   Protein-calorie malnutrition, severe   Thrombocytopenia, unspecified   Fall at home   Anemia   Aftercare following surgery of the circulatory system, [facility]       Imaging: No results found.

## 2013-07-31 NOTE — Patient Instructions (Signed)
Your physician recommends that you schedule a follow-up appointment in: 6 months with Dr Virgina Jock will receive a reminder letter two months in advance reminding you to call and schedule your appointment. If you don't receive this letter, please contact our office.  Your physician has recommended you make the following change in your medication:   Discontinue Welchol

## 2013-07-31 NOTE — Assessment & Plan Note (Signed)
Continue risk management with lipitor and ASA. I will d/c Welchol. He will have follow up labs in 6 months per Dr. Wolfgang Phoenix. See him in 6 months to be established with Dr. Bronson Ing.

## 2013-07-31 NOTE — Progress Notes (Signed)
HPI: [name] is a 68 year old patient to be est. with Dr. Pennelope Bracken or Harl Bowie we are following for ongoing assessment and management of multiple cardiovascular issues. The patient was last seen in the office in November 2014 after admission in July in the setting of a non-ST elevation MI. Cardiac catheterization revealed the need for a bare-metal stent to the LAD secondary to recurrent focal proximal lesion. He did have some residual LAD disease but this was felt to be treated medically only after review by 2 cardiologists. At last office visit he was doing well and tolerating dual antiplatelet therapy without issues.    He comes today feeling a little foggy. States that he sometimes feels like he is in the [place]. Energy level is about the same. Wants to stop taking Welchol if he can. Labs have just been completed in March. They are all WNL  No Known Allergies  Current Outpatient Prescriptions  Medication Sig Dispense Refill  . acetaminophen (TYLENOL) 325 MG tablet Take 2 tablets (650 mg total) by mouth 3 (three) times daily as needed for pain.      Marland Kitchen amLODipine (NORVASC) 5 MG tablet TAKE 1 TABLET EVERY DAY  30 tablet  3  . aspirin 81 MG tablet Take 81 mg by mouth daily.      Marland Kitchen atorvastatin (LIPITOR) 80 MG tablet Take 1 tablet (80 mg total) by mouth daily at 6 PM.  90 tablet  3  . BRILINTA 90 MG TABS tablet TAKE 1 TABLET TWICE A DAY  60 tablet  3  . carvedilol (COREG) 6.25 MG tablet Take 1 tablet (6.25 mg total) by mouth 2 (two) times daily with a meal. Do not take this medication if your heart rate is less than 55 beats per minute. Discuss further with your primary doctor.  180 tablet  3  . citalopram (CELEXA) 20 MG tablet TAKE 1/2 TABLET BY MOUTH EVERY DAY  15 tablet  2  . diclofenac sodium (VOLTAREN) 1 % GEL Apply to right knee twice daily for one more week, and twice daily only as needed thereafter.  100 g  1  . Multiple Vitamin (MULTIVITAMIN WITH MINERALS) TABS Take 1 tablet by  mouth daily.      . nitroGLYCERIN (NITROSTAT) 0.4 MG SL tablet Place 0.4 mg under the tongue every 5 (five) minutes as needed for chest pain.      Marland Kitchen thiamine 100 MG tablet Take 1 tablet (100 mg total) by mouth daily. B. vitamin.      . WELCHOL 625 MG tablet TAKE 2 TABLETS BY MOUTH 2 TIMES DAILY.  120 tablet  3  . [DISCONTINUED] cloNIDine (CATAPRES) 0.2 MG tablet Take 0.2 mg by mouth 2 (two) times daily.        . [DISCONTINUED] metoprolol (TOPROL-XL) 50 MG 24 hr tablet Take 50 mg by mouth daily.        . [DISCONTINUED] niacin (NIASPAN) 500 MG CR tablet Take 500 mg by mouth at bedtime.        . [DISCONTINUED] omeprazole (PRILOSEC) 20 MG capsule 1 po every morning  30 capsule  5   No current facility-administered medications for this visit.    Past Medical History  Diagnosis Date  . Hypertension   . COPD (chronic obstructive pulmonary disease)   . Hyperlipidemia   . Cardiomyopathy     EF of 30% per echo in June of 2012; admitted with congestive heart failure; nonobstructive CAD on cath in 08/2010  . History  of noncompliance with medical treatment   . Cerebrovascular disease 2011    CVA  in 02/2010-only deficit is decreased vision in  right eye  . Alcohol abuse   . Tobacco abuse     40 pack years  . Abdominal aortic aneurysm     Stent graft repair  . Stroke 2011    decreased vision of right eye  . Chronic systolic CHF (congestive heart failure)   . Leg pain   . CKD (chronic kidney disease) stage 3, GFR 30-59 ml/min     creatinine-1.7 in 08/2010  . CAD (coronary artery disease)   . Myocardial infarction acute 10/01/11  . CKD (chronic kidney disease), stage III 12/06/2012  . Effusion of right knee joint 12/06/2012  . Anemia   . Atrial fibrillation     Past Surgical History  Procedure Laterality Date  . Colonoscopy w/ polypectomy  2006    Dr. Tamala Julian: anal fissure, sessile adenomatous polyp, diverticulosis  . Esophagogastroduodenoscopy  11/15/2010    Procedure:  ESOPHAGOGASTRODUODENOSCOPY (EGD);  Surgeon: Dorothyann Peng, MD;  Location: [facility];  Service: Endoscopy;  Laterality: N/A;  with propofol sedation; procedure start @ 0813  . Flexible sigmoidoscopy  11/15/2010    Procedure: FLEXIBLE SIGMOIDOSCOPY;  Surgeon: Dorothyann Peng, MD;  Location: [facility];  Service: Endoscopy;  Laterality: N/A;  with propofol sedation; ended @ 0808  . Polypectomy  12/13/2010    Procedure: POLYPECTOMY;  Surgeon: Dorothyann Peng, MD;  Location: [facility];  Service: Endoscopy;  Laterality: N/A;  right colon, cecal, transverse, and sigmoid polypectomy  . Cardiac catheterization  10/04/11    Normal left main, 95% pLAD stenosis with ulceration s/p successful BMS placement, 30% segmental plaque beyond this, dLAD after D2 with 50% plaquing, then focal 70% lesion, 80% focal short D2 stenosis, 30% pOM2 lesion, 30-40% mOM3 stenosis, Shephard's crook region of RCA with 50% lesion, mRCA 50-60% lesion and mild dRCA irregularities.   . Coronary stent placement  10/04/11  . Abdominal aortic aneurysm repair w/ endoluminal graft      01/16/2008    ROS: Review of systems complete and found to be negative unless listed above  PHYSICAL EXAM BP 138/68  Pulse 68  Ht 6\' 3"  (1.905 m)  Wt 165 lb (74.844 kg)  BMI 20.62 kg/m2 General: Well developed, well nourished, in no acute distress Head: Eyes PERRLA, No xanthomas.   Normal cephalic and atramatic  Lungs: Some bilateral crackles chronically. No wheezes. Heart: HRRR S1 S2, without MRG.  Pulses are 2+ & equal.            No carotid bruit. No JVD.  No abdominal bruits. No femoral bruits. Abdomen: Bowel sounds are positive, abdomen soft and non-tender without masses or                  Hernia's noted. Msk:  Back normal, normal gait, uses . Normal strength and tone for age. Extremities: No clubbing, cyanosis or edema.  DP +1 Neuro: Alert and oriented X 3. Psych:  Good affect, responds appropriately   ASSESSMENT AND PLAN

## 2013-07-31 NOTE — Assessment & Plan Note (Signed)
Continue statin therapy.

## 2013-08-25 ENCOUNTER — Other Ambulatory Visit: Payer: Self-pay | Admitting: Adult Health

## 2013-10-04 ENCOUNTER — Other Ambulatory Visit: Payer: Self-pay | Admitting: Family Medicine

## 2013-11-03 ENCOUNTER — Other Ambulatory Visit: Payer: Self-pay | Admitting: Adult Health

## 2013-11-20 ENCOUNTER — Ambulatory Visit (INDEPENDENT_AMBULATORY_CARE_PROVIDER_SITE_OTHER): Payer: Medicare Other | Admitting: Family Medicine

## 2013-11-20 ENCOUNTER — Encounter: Payer: Self-pay | Admitting: Family Medicine

## 2013-11-20 VITALS — BP 130/76 | Ht 75.0 in | Wt 165.2 lb

## 2013-11-20 DIAGNOSIS — R270 Ataxia, unspecified: Secondary | ICD-10-CM

## 2013-11-20 DIAGNOSIS — N183 Chronic kidney disease, stage 3 unspecified: Secondary | ICD-10-CM

## 2013-11-20 DIAGNOSIS — Z79899 Other long term (current) drug therapy: Secondary | ICD-10-CM

## 2013-11-20 DIAGNOSIS — E785 Hyperlipidemia, unspecified: Secondary | ICD-10-CM

## 2013-11-20 DIAGNOSIS — R279 Unspecified lack of coordination: Secondary | ICD-10-CM

## 2013-11-20 DIAGNOSIS — F0150 Vascular dementia without behavioral disturbance: Secondary | ICD-10-CM

## 2013-11-20 NOTE — Progress Notes (Signed)
   Subjective:    Patient ID: [name], male    DOB: May 18, 1945, 68 y.o.   MRN: [id number]  Hyperlipidemia This is a chronic problem. The current episode started more than 1 year ago. The problem is controlled. Recent lipid tests were reviewed and are normal. There are no known factors aggravating his hyperlipidemia. Current antihyperlipidemic treatment includes statins. The current treatment provides significant improvement of lipids. There are no compliance problems.  There are no known risk factors for coronary artery disease.  Patient states that he has no concerns at this time.  Patient's wife states that she has some concerns but  Dr. Nicki Reaper is already aware of them.  Because of his mini strokes patient has minimal desire to get up and do things minimal desires to interact. He is not depressed.   Review of Systems    denies chest tightness pressure pain nausea vomiting or diarrhea Objective:   Physical Exam Lungs are clear heart regular pulse normal extremities no edema weaknesses detected as difficult time walking with a walker has moderate ataxia       Assessment & Plan:  Patient has significant weakness associated with mini strokes and also significant decline in his motor skills. I discussed with the patient and with the wife based on all this we will hold off on doing an MRI currently this patient would benefit from home physical therapy it is a burden to him and family did try to get out patient does not drive he is debilitated. Home physical therapy because of weakness and falls. Face-to-face was done today. And this is medically necessary.  Greater in 25 minutes spent with patient

## 2013-11-25 ENCOUNTER — Emergency Department (HOSPITAL_COMMUNITY): Payer: Medicare Other

## 2013-11-25 ENCOUNTER — Encounter (HOSPITAL_COMMUNITY): Payer: Self-pay | Admitting: Emergency Medicine

## 2013-11-25 ENCOUNTER — Inpatient Hospital Stay (HOSPITAL_COMMUNITY)
Admission: EM | Admit: 2013-11-25 | Discharge: 2013-11-29 | DRG: 389 | Disposition: A | Discharge status: Home Health | Payer: Medicare Other | Attending: Internal Medicine | Admitting: Internal Medicine

## 2013-11-25 DIAGNOSIS — I428 Other cardiomyopathies: Secondary | ICD-10-CM | POA: Diagnosis present

## 2013-11-25 DIAGNOSIS — Z9119 Patient's noncompliance with other medical treatment and regimen: Secondary | ICD-10-CM

## 2013-11-25 DIAGNOSIS — I129 Hypertensive chronic kidney disease with stage 1 through stage 4 chronic kidney disease, or unspecified chronic kidney disease: Secondary | ICD-10-CM | POA: Diagnosis present

## 2013-11-25 DIAGNOSIS — R748 Abnormal levels of other serum enzymes: Secondary | ICD-10-CM | POA: Diagnosis present

## 2013-11-25 DIAGNOSIS — K5641 Fecal impaction: Principal | ICD-10-CM | POA: Diagnosis present

## 2013-11-25 DIAGNOSIS — F0150 Vascular dementia without behavioral disturbance: Secondary | ICD-10-CM | POA: Diagnosis present

## 2013-11-25 DIAGNOSIS — I2510 Atherosclerotic heart disease of native coronary artery without angina pectoris: Secondary | ICD-10-CM | POA: Diagnosis present

## 2013-11-25 DIAGNOSIS — K5900 Constipation, unspecified: Secondary | ICD-10-CM | POA: Diagnosis present

## 2013-11-25 DIAGNOSIS — I252 Old myocardial infarction: Secondary | ICD-10-CM

## 2013-11-25 DIAGNOSIS — Z8673 Personal history of transient ischemic attack (TIA), and cerebral infarction without residual deficits: Secondary | ICD-10-CM

## 2013-11-25 DIAGNOSIS — E785 Hyperlipidemia, unspecified: Secondary | ICD-10-CM | POA: Diagnosis present

## 2013-11-25 DIAGNOSIS — Z720 Tobacco use: Secondary | ICD-10-CM | POA: Diagnosis present

## 2013-11-25 DIAGNOSIS — I714 Abdominal aortic aneurysm, without rupture, unspecified: Secondary | ICD-10-CM | POA: Diagnosis present

## 2013-11-25 DIAGNOSIS — F17200 Nicotine dependence, unspecified, uncomplicated: Secondary | ICD-10-CM | POA: Diagnosis present

## 2013-11-25 DIAGNOSIS — E860 Dehydration: Secondary | ICD-10-CM | POA: Diagnosis present

## 2013-11-25 DIAGNOSIS — Z91199 Patient's noncompliance with other medical treatment and regimen due to unspecified reason: Secondary | ICD-10-CM

## 2013-11-25 DIAGNOSIS — N390 Urinary tract infection, site not specified: Secondary | ICD-10-CM | POA: Diagnosis present

## 2013-11-25 DIAGNOSIS — W19XXXA Unspecified fall, initial encounter: Secondary | ICD-10-CM | POA: Diagnosis present

## 2013-11-25 DIAGNOSIS — N39490 Overflow incontinence: Secondary | ICD-10-CM | POA: Diagnosis present

## 2013-11-25 DIAGNOSIS — D649 Anemia, unspecified: Secondary | ICD-10-CM | POA: Diagnosis present

## 2013-11-25 DIAGNOSIS — N183 Chronic kidney disease, stage 3 unspecified: Secondary | ICD-10-CM | POA: Diagnosis present

## 2013-11-25 DIAGNOSIS — J209 Acute bronchitis, unspecified: Secondary | ICD-10-CM | POA: Diagnosis present

## 2013-11-25 DIAGNOSIS — Z9181 History of falling: Secondary | ICD-10-CM

## 2013-11-25 DIAGNOSIS — I498 Other specified cardiac arrhythmias: Secondary | ICD-10-CM | POA: Diagnosis present

## 2013-11-25 DIAGNOSIS — R197 Diarrhea, unspecified: Secondary | ICD-10-CM

## 2013-11-25 DIAGNOSIS — N289 Disorder of kidney and ureter, unspecified: Secondary | ICD-10-CM

## 2013-11-25 DIAGNOSIS — I509 Heart failure, unspecified: Secondary | ICD-10-CM | POA: Diagnosis present

## 2013-11-25 DIAGNOSIS — Z8249 Family history of ischemic heart disease and other diseases of the circulatory system: Secondary | ICD-10-CM

## 2013-11-25 DIAGNOSIS — I951 Orthostatic hypotension: Secondary | ICD-10-CM | POA: Diagnosis present

## 2013-11-25 DIAGNOSIS — I4891 Unspecified atrial fibrillation: Secondary | ICD-10-CM | POA: Diagnosis present

## 2013-11-25 DIAGNOSIS — J440 Chronic obstructive pulmonary disease with acute lower respiratory infection: Secondary | ICD-10-CM | POA: Diagnosis present

## 2013-11-25 DIAGNOSIS — Y92009 Unspecified place in unspecified non-institutional (private) residence as the place of occurrence of the external cause: Secondary | ICD-10-CM

## 2013-11-25 DIAGNOSIS — I5022 Chronic systolic (congestive) heart failure: Secondary | ICD-10-CM | POA: Diagnosis present

## 2013-11-25 DIAGNOSIS — R42 Dizziness and giddiness: Secondary | ICD-10-CM | POA: Diagnosis not present

## 2013-11-25 DIAGNOSIS — N179 Acute kidney failure, unspecified: Secondary | ICD-10-CM | POA: Diagnosis present

## 2013-11-25 HISTORY — DX: Repeated falls: R29.6

## 2013-11-25 HISTORY — DX: Weakness: R53.1

## 2013-11-25 HISTORY — DX: Ataxic gait: R26.0

## 2013-11-25 LAB — CBC WITH DIFFERENTIAL/PLATELET
BASOS ABS: 0 10*3/uL (ref 0.0–0.1)
Basophils Relative: 0 % (ref 0–1)
Eosinophils Absolute: 0.1 10*3/uL (ref 0.0–0.7)
Eosinophils Relative: 1 % (ref 0–5)
HEMATOCRIT: 35.8 % — AB (ref 39.0–52.0)
Hemoglobin: 12.5 g/dL — ABNORMAL LOW (ref 13.0–17.0)
LYMPHS PCT: 18 % (ref 12–46)
Lymphs Abs: 1.7 10*3/uL (ref 0.7–4.0)
MCH: 32 pg (ref 26.0–34.0)
MCHC: 34.9 g/dL (ref 30.0–36.0)
MCV: 91.6 fL (ref 78.0–100.0)
MONO ABS: 0.9 10*3/uL (ref 0.1–1.0)
Monocytes Relative: 9 % (ref 3–12)
Neutro Abs: 6.9 10*3/uL (ref 1.7–7.7)
Neutrophils Relative %: 72 % (ref 43–77)
Platelets: 213 10*3/uL (ref 150–400)
RBC: 3.91 MIL/uL — ABNORMAL LOW (ref 4.22–5.81)
RDW: 14.7 % (ref 11.5–15.5)
WBC: 9.6 10*3/uL (ref 4.0–10.5)

## 2013-11-25 LAB — COMPREHENSIVE METABOLIC PANEL
ALT: 14 U/L (ref 0–53)
ANION GAP: 12 (ref 5–15)
AST: 24 U/L (ref 0–37)
Albumin: 3.2 g/dL — ABNORMAL LOW (ref 3.5–5.2)
Alkaline Phosphatase: 120 U/L — ABNORMAL HIGH (ref 39–117)
BUN: 27 mg/dL — AB (ref 6–23)
CALCIUM: 9.2 mg/dL (ref 8.4–10.5)
CO2: 24 meq/L (ref 19–32)
CREATININE: 2.61 mg/dL — AB (ref 0.50–1.35)
Chloride: 105 mEq/L (ref 96–112)
GFR, EST AFRICAN AMERICAN: 27 mL/min — AB (ref >90)
GFR, EST NON AFRICAN AMERICAN: 23 mL/min — AB (ref >90)
Glucose, Bld: 90 mg/dL (ref 70–99)
Potassium: 4.6 mEq/L (ref 3.7–5.3)
Sodium: 141 mEq/L (ref 137–147)
Total Bilirubin: 0.4 mg/dL (ref 0.3–1.2)
Total Protein: 7.5 g/dL (ref 6.0–8.3)

## 2013-11-25 LAB — URINALYSIS, ROUTINE W REFLEX MICROSCOPIC
Bilirubin Urine: NEGATIVE
GLUCOSE, UA: NEGATIVE mg/dL
KETONES UR: NEGATIVE mg/dL
Leukocytes, UA: NEGATIVE
Nitrite: NEGATIVE
Specific Gravity, Urine: 1.02 (ref 1.005–1.030)
Urobilinogen, UA: 0.2 mg/dL (ref 0.0–1.0)
pH: 6 (ref 5.0–8.0)

## 2013-11-25 LAB — URINE MICROSCOPIC-ADD ON

## 2013-11-25 LAB — LACTIC ACID, PLASMA: LACTIC ACID, VENOUS: 1 mmol/L (ref 0.5–2.2)

## 2013-11-25 LAB — CLOSTRIDIUM DIFFICILE BY PCR: Toxigenic C. Difficile by PCR: NEGATIVE

## 2013-11-25 LAB — TROPONIN I: Troponin I: <0.3 ng/mL (ref <0.30)

## 2013-11-25 LAB — LIPASE, BLOOD: LIPASE: 78 U/L — AB (ref 11–59)

## 2013-11-25 MED ORDER — ASPIRIN EC 81 MG PO TBEC
81.0000 mg | DELAYED_RELEASE_TABLET | Freq: Every day | ORAL | Status: DC
  Administered 2013-11-25 – 2013-11-29 (×5): 81 mg via ORAL
  Filled 2013-11-25 (×6): qty 1

## 2013-11-25 MED ORDER — ADULT MULTIVITAMIN W/MINERALS CH
1.0000 | ORAL_TABLET | Freq: Every day | ORAL | Status: DC
  Administered 2013-11-25 – 2013-11-29 (×5): 1 via ORAL
  Filled 2013-11-25 (×5): qty 1

## 2013-11-25 MED ORDER — SORBITOL 70 % SOLN: 30.0000 mL | Freq: Every day | Status: DC | PRN

## 2013-11-25 MED ORDER — TICAGRELOR 90 MG PO TABS
90.0000 mg | ORAL_TABLET | Freq: Once | ORAL | Status: AC
  Administered 2013-11-25: 90 mg via ORAL
  Filled 2013-11-25: qty 1

## 2013-11-25 MED ORDER — CARVEDILOL 3.125 MG PO TABS: 6.2500 mg | ORAL_TABLET | Freq: Two times a day (BID) | ORAL | Status: DC

## 2013-11-25 MED ORDER — HEPARIN SODIUM (PORCINE) 5000 UNIT/ML IJ SOLN
5000.0000 [IU] | Freq: Three times a day (TID) | INTRAMUSCULAR | Status: DC
  Administered 2013-11-25 – 2013-11-29 (×13): 5000 [IU] via SUBCUTANEOUS
  Filled 2013-11-25 (×13): qty 1

## 2013-11-25 MED ORDER — CITALOPRAM HYDROBROMIDE 20 MG PO TABS
10.0000 mg | ORAL_TABLET | Freq: Every day | ORAL | Status: DC
  Administered 2013-11-25 – 2013-11-29 (×5): 10 mg via ORAL
  Filled 2013-11-25 (×5): qty 1

## 2013-11-25 MED ORDER — SODIUM CHLORIDE 0.9 % IV SOLN
INTRAVENOUS | Status: DC
  Administered 2013-11-25 – 2013-11-28 (×9): via INTRAVENOUS

## 2013-11-25 MED ORDER — MAGNESIUM CITRATE PO SOLN: 1.0000 | Freq: Once | ORAL | Status: AC | PRN

## 2013-11-25 MED ORDER — SODIUM CHLORIDE 0.9 % IV SOLN
INTRAVENOUS | Status: AC
  Administered 2013-11-25: 16:00:00 via INTRAVENOUS

## 2013-11-25 MED ORDER — TICAGRELOR 90 MG PO TABS
ORAL_TABLET | ORAL | Status: AC
  Filled 2013-11-25: qty 1

## 2013-11-25 MED ORDER — CARVEDILOL 3.125 MG PO TABS
3.1250 mg | ORAL_TABLET | Freq: Two times a day (BID) | ORAL | Status: DC
  Administered 2013-11-25 – 2013-11-27 (×5): 3.125 mg via ORAL
  Filled 2013-11-25 (×5): qty 1

## 2013-11-25 MED ORDER — ATORVASTATIN CALCIUM 40 MG PO TABS
80.0000 mg | ORAL_TABLET | Freq: Every day | ORAL | Status: DC
  Administered 2013-11-25 – 2013-11-28 (×4): 80 mg via ORAL
  Filled 2013-11-25 (×4): qty 2

## 2013-11-25 NOTE — ED Notes (Signed)
RN attempted to digitally disimpact patient. No stool present in rectum.

## 2013-11-25 NOTE — ED Notes (Addendum)
Pt presents to the ED stating he had diarrhea 1 X since last night and "feeling woozy." Pt denies N/V. NAD noted in triage. Pt's wife states pt has had uncontrollable diarrhea and has been "acting confused."

## 2013-11-25 NOTE — H&P (Signed)
[facility] History and Physical  [name] KDT:[id number] DOB: 03-Jan-1946 DOA: 11/25/2013  Referring physician: ED PCP: [name], MD  Specialists: none  Chief Complaint: Orthostasis  HPI:  68 y/o ?, h/o COPD, Prior CKD stag3, CAD s/p MI 09/2011-stent BMS on ASa Brilinta/ICN, Vascular dementia, Htn, Tob abuse, AAA s/ endovascular repair c Cook Zenith device 01/16/2008, COlon 2006 Anal fissure-Polpy, H/o CVA 03/18/10 and multiple TIA's who presented to [facility] with an episode of confusion, dizziness and fall. At his baseline he does not really get up out of his bed but is able to at least get to the bedside. He states that at around 11 PM on 9/60 started to have bouts of diarrhea. His wife reports that he had 3 days of diarrhea in the middle of August which subsequently spontaneously resolved. He had no fever no chills no nausea no vomiting no abdominal pain but felt dizzy. He denies any chest pain or shortness of breath He has had no exposure to recent antibiotics or exposure to anyone that has been acutely ill He's been eating and drinking only fairly and usually his blood pressure is very well controlled He has history of chronic multiple small TIAs and saw his primary care physician on 9/2/the who elected not to do an MRI because of difficulty with transport agent to and from appointments and to get imaging studies. His wife reports overnight he had a decreased amount of urine in the urinal.    He has had no unilateral weakness however he is weakness and debility and suffered from chronic falls, most recently about 2 weeks ago and is in the process of getting home health arranged through PCP  ER evaluation revealed protein above 300 many bacteria Acute abdominal series showed severe stool burden, manual disimpaction attempted but unsuccessful BUN/creatinine elevated from baseline 50/1.6-27/2.6 Alkaline phosphatase 120, lipase 78  Review of Systems:  12 organ system review  performed and negative except as per noted above no addition  Past Medical History  Diagnosis Date  . Hypertension   . COPD (chronic obstructive pulmonary disease)   . Hyperlipidemia   . Cardiomyopathy     EF of 30% per echo in June of 2012; admitted with congestive heart failure; nonobstructive CAD on cath in 08/2010  . History of noncompliance with medical treatment   . Cerebrovascular disease 2011    CVA  in 02/2010-only deficit is decreased vision in  right eye  . Alcohol abuse   . Tobacco abuse     40 pack years  . Abdominal aortic aneurysm     Stent graft repair  . Stroke 2011    decreased vision of right eye  . Chronic systolic CHF (congestive heart failure)   . Leg pain   . CKD (chronic kidney disease) stage 3, GFR 30-59 ml/min     creatinine-1.7 in 08/2010  . CAD (coronary artery disease)   . Myocardial infarction acute 10/01/11  . CKD (chronic kidney disease), stage III 12/06/2012  . Effusion of right knee joint 12/06/2012  . Anemia   . Atrial fibrillation   . Ataxic gait   . Weakness generalized   . Frequent falls    Past Surgical History  Procedure Laterality Date  . Colonoscopy w/ polypectomy  2006    Dr. Tamala Julian: anal fissure, sessile adenomatous polyp, diverticulosis  . Esophagogastroduodenoscopy  11/15/2010    Procedure: ESOPHAGOGASTRODUODENOSCOPY (EGD);  Surgeon: Dorothyann Peng, MD;  Location: [facility];  Service: Endoscopy;  Laterality: N/A;  with propofol sedation; procedure start @ 0813  . Flexible sigmoidoscopy  11/15/2010    Procedure: FLEXIBLE SIGMOIDOSCOPY;  Surgeon: Dorothyann Peng, MD;  Location: [facility];  Service: Endoscopy;  Laterality: N/A;  with propofol sedation; ended @ 0808  . Polypectomy  12/13/2010    Procedure: POLYPECTOMY;  Surgeon: Dorothyann Peng, MD;  Location: [facility];  Service: Endoscopy;  Laterality: N/A;  right colon, cecal, transverse, and sigmoid polypectomy  . Cardiac catheterization  10/04/11    Normal left main, 95% pLAD stenosis with  ulceration s/p successful BMS placement, 30% segmental plaque beyond this, dLAD after D2 with 50% plaquing, then focal 70% lesion, 80% focal short D2 stenosis, 30% pOM2 lesion, 30-40% mOM3 stenosis, Shephard's crook region of RCA with 50% lesion, mRCA 50-60% lesion and mild dRCA irregularities.   . Coronary stent placement  10/04/11  . Abdominal aortic aneurysm repair w/ endoluminal graft      01/16/2008   Social History:  History   Social History Narrative  . No narrative on file    No Known Allergies  Family History  Problem Relation Age of Onset  . Colon cancer Neg Hx   . Anesthesia problems Neg Hx   . Hypotension Neg Hx   . Malignant hyperthermia Neg Hx   . Pseudochol deficiency Neg Hx   . Cancer Mother   . Heart disease Mother     before age 57  . Hyperlipidemia Mother   . Hypertension Mother      Prior to Admission medications   Medication Sig Start Date End Date Taking? Authorizing Provider  acetaminophen (TYLENOL) 325 MG tablet Take 2 tablets (650 mg total) by mouth 3 (three) times daily as needed for pain. 12/08/12  Yes Rexene Alberts, MD  amLODipine (NORVASC) 5 MG tablet TAKE 1 TABLET EVERY DAY 11/04/13  Yes Lendon Colonel, NP  aspirin 81 MG tablet Take 81 mg by mouth daily.   Yes Historical Provider, MD  atorvastatin (LIPITOR) 80 MG tablet Take 1 tablet (80 mg total) by mouth daily at 6 PM. 07/11/13  Yes Lendon Colonel, NP  BRILINTA 90 MG TABS tablet TAKE 1 TABLET TWICE A DAY   Yes Lendon Colonel, NP  carvedilol (COREG) 6.25 MG tablet Take 1 tablet (6.25 mg total) by mouth 2 (two) times daily with a meal. Do not take this medication if your heart rate is less than 55 beats per minute. Discuss further with your primary doctor. 07/11/13  Yes Lendon Colonel, NP  citalopram (CELEXA) 20 MG tablet TAKE 1/2 TABLET BY MOUTH EVERY DAY 10/04/13  Yes Kathyrn Drown, MD  colesevelam (WELCHOL) 625 MG tablet Take 1,250 mg by mouth 2 (two) times daily with a meal.   Yes  Historical Provider, MD  Multiple Vitamin (MULTIVITAMIN WITH MINERALS) TABS Take 1 tablet by mouth daily.   Yes Historical Provider, MD  nitroGLYCERIN (NITROSTAT) 0.4 MG SL tablet Place 0.4 mg under the tongue every 5 (five) minutes as needed for chest pain.   Yes Historical Provider, MD  thiamine 100 MG tablet Take 1 tablet (100 mg total) by mouth daily. B. vitamin. 12/08/12  Yes Rexene Alberts, MD   Physical Exam: Filed Vitals:   11/25/13 1101 11/25/13 1337  BP: 154/91 177/81  Pulse: 65 57  Temp: 98.9 F (37.2 C)   TempSrc: Oral   Resp: 17 16  Height: 6\' 3"  (1.905 m)   Weight: 74.844 kg (165 lb)   SpO2: 100% 100%  General:  Alert pleasant oriented no apparent distress  Eyes: EOMI NCAT  ENT: Moderate dentition  Neck: Soft supple no JVD  Cardiovascular: S1-S2 no murmur rub or gallop no JVD no bruit  Respiratory: Clinically clear  Abdomen: Soft nontender no distention  Skin: No lower extremity edema, venous stasis changes  Musculoskeletal: Range of motion intact  Psychiatric: Euthymic  Neurologic: In the are grossly intact by history examination, power 5/5 in major muscle groups such as hips shoulders biceps triceps, reflexes 2/80, finger-nose-finger is normal, no past pointing or dysmetria.  Labs on Admission:  Basic Metabolic Panel:  Recent Labs Lab 11/25/13 1144  NA 141  K 4.6  CL 105  CO2 24  GLUCOSE 90  BUN 27*  CREATININE 2.61*  CALCIUM 9.2   Liver Function Tests:  Recent Labs Lab 11/25/13 1144  AST 24  ALT 14  ALKPHOS 120*  BILITOT 0.4  PROT 7.5  ALBUMIN 3.2*    Recent Labs Lab 11/25/13 1144  LIPASE 78*   No results found for this basename: AMMONIA,  in the last 168 hours CBC:  Recent Labs Lab 11/25/13 1144  WBC 9.6  NEUTROABS 6.9  HGB 12.5*  HCT 35.8*  MCV 91.6  PLT 213   Cardiac Enzymes:  Recent Labs Lab 11/25/13 1144  TROPONINI <0.30    BNP (last 3 results)  Recent Labs  12/06/12 0456  PROBNP 898.6*    CBG: No results found for this basename: GLUCAP,  in the last 168 hours  Radiological Exams on Admission: Dg Abd Acute W/chest  11/25/2013   CLINICAL DATA:  Diarrhea  EXAM: ACUTE ABDOMEN SERIES (ABDOMEN 2 VIEW & CHEST 1 VIEW)  COMPARISON:  None.  FINDINGS: Normal heart size. Lungs hyper aerated and grossly clear. Aorto bi-iliac stent graft is in place. No disproportionate dilatation of bowel. No free intraperitoneal gas. Severe stool burden in the rectum. There is also stool throughout the transverse and descending colon.  IMPRESSION: Rectal fecal impaction.   Electronically Signed   By: Maryclare Bean M.D.   On: 11/25/2013 13:43    EKG: Independently reviewed. Normal sinus rhythm PR interval 0.12 QRS axis 60 ST elevation with old injury pattern noted  Assessment/Plan Principal Problem:   Fecal impaction with overflow incontinence of loose stool unlikely infectious-C. difficile PCR/GI pathogen panel ordered. Patient will need Water enema followed by sorbitol daily and may need max citrate as well. I highly doubt that this is infectious. He will need a good bowel regimen on discharge home. I would discontinue his calcium channel blocker if he continues to have diarrhea this could slow smooth muscle down Active Problems:   CKD (chronic kidney disease), stage III-acute kidney injury superimposed on chronic component-see above . Will need IV fluids . Repeat labs in a.m.    orthostasis, dizzy-continue saline at  100 cc per hour at least overnight and reassess orthostatics   Tobacco abuse-still occasional smoker. Needs outpatient counseling   Abdominal aneurysm without mention of rupture-need outpatient monitoring by primary care physician   CAD (coronary artery disease)--continue [place] with 90 daily, aspirin 81 daily.  Will continue his Coreg at a lower dose of   3.125 daily and monitor    Chronic systolic CHF (congestive heart failure)-see above discussion-he has no evidence of any heart failure is    Fall [facility] currently needs PT OT to see him . Might be getting home health as an outpatient    UTI (urinary tract infection)-monitor urine culture unlikely to be  acute infection   proteinuria-he has had some now marked proteinuria he may benefit from ACE inhibitor in the long-term once his renal insufficiency is resolved   Elevated alkaline phosphatase, lipase-? Pancreatitis. Repeat LFTs and lipase in a.m.-consider ultrasound based on those findings but he is completely nontender and is now having any issues with nausea vomiting  Full CODE STATUS   discussed with wife at bedside Observation/telemetry   Time spent: [place], [facility] Pager [phone]  If 7PM-7AM, please contact night-coverage [email or web address] Password [email or web address] 11/25/2013, 2:27 PM

## 2013-11-25 NOTE — ED Provider Notes (Signed)
CSN: [id number]     Arrival date & time 11/25/13  1057 History   First MD Initiated Contact with Patient 11/25/13 1121     Chief Complaint  Patient presents with  . Diarrhea  . Fatigue     HPI Pt was seen at 1130. Per pt and his family, c/o gradual onset and worsening of persistent generalized weakness since yesterday. Has been associated with multiple intermittent episodes of "loose stool" and "watery stools." Pt states he "feels woozy" when he tries to get up from laying or sitting. Denies N/V, no fevers, no abd pain, no back pain, no CP/SOB, no black or blood in stools, no recent abx use.    Past Medical History  Diagnosis Date  . Hypertension   . COPD (chronic obstructive pulmonary disease)   . Hyperlipidemia   . Cardiomyopathy     EF of 30% per echo in June of 2012; admitted with congestive heart failure; nonobstructive CAD on cath in 08/2010  . History of noncompliance with medical treatment   . Cerebrovascular disease 2011    CVA  in 02/2010-only deficit is decreased vision in  right eye  . Alcohol abuse   . Tobacco abuse     40 pack years  . Abdominal aortic aneurysm     Stent graft repair  . Stroke 2011    decreased vision of right eye  . Chronic systolic CHF (congestive heart failure)   . Leg pain   . CKD (chronic kidney disease) stage 3, GFR 30-59 ml/min     creatinine-1.7 in 08/2010  . CAD (coronary artery disease)   . Myocardial infarction acute 10/01/11  . CKD (chronic kidney disease), stage III 12/06/2012  . Effusion of right knee joint 12/06/2012  . Anemia   . Atrial fibrillation   . Ataxic gait   . Weakness generalized   . Frequent falls    Past Surgical History  Procedure Laterality Date  . Colonoscopy w/ polypectomy  2006    Dr. Tamala Julian: anal fissure, sessile adenomatous polyp, diverticulosis  . Esophagogastroduodenoscopy  11/15/2010    Procedure: ESOPHAGOGASTRODUODENOSCOPY (EGD);  Surgeon: Dorothyann Peng, MD;  Location: [facility];  Service: Endoscopy;   Laterality: N/A;  with propofol sedation; procedure start @ 0813  . Flexible sigmoidoscopy  11/15/2010    Procedure: FLEXIBLE SIGMOIDOSCOPY;  Surgeon: Dorothyann Peng, MD;  Location: [facility];  Service: Endoscopy;  Laterality: N/A;  with propofol sedation; ended @ 0808  . Polypectomy  12/13/2010    Procedure: POLYPECTOMY;  Surgeon: Dorothyann Peng, MD;  Location: [facility];  Service: Endoscopy;  Laterality: N/A;  right colon, cecal, transverse, and sigmoid polypectomy  . Cardiac catheterization  10/04/11    Normal left main, 95% pLAD stenosis with ulceration s/p successful BMS placement, 30% segmental plaque beyond this, dLAD after D2 with 50% plaquing, then focal 70% lesion, 80% focal short D2 stenosis, 30% pOM2 lesion, 30-40% mOM3 stenosis, Shephard's crook region of RCA with 50% lesion, mRCA 50-60% lesion and mild dRCA irregularities.   . Coronary stent placement  10/04/11  . Abdominal aortic aneurysm repair w/ endoluminal graft      01/16/2008   Family History  Problem Relation Age of Onset  . Colon cancer Neg Hx   . Anesthesia problems Neg Hx   . Hypotension Neg Hx   . Malignant hyperthermia Neg Hx   . Pseudochol deficiency Neg Hx   . Cancer Mother   . Heart disease Mother     before age  76  . Hyperlipidemia Mother   . Hypertension Mother    History  Substance Use Topics  . Smoking status: Current Every Day Smoker -- 0.50 packs/day for 50 years    Types: Cigarettes  . Smokeless tobacco: Never Used  . Alcohol Use: 16.8 oz/week    28 Shots of liquor per week     Comment: quit about 1 month ago, prior to this would drink about 7 oz of liquor per day 10/2012    Review of Systems ROS: Statement: All systems negative except as marked or noted in the HPI; Constitutional: Negative for fever and chills. +generalized weakness. ; ; Eyes: Negative for eye pain, redness and discharge. ; ; ENMT: Negative for ear pain, hoarseness, nasal congestion, sinus pressure and sore throat. ; ; Cardiovascular:  Negative for chest pain, palpitations, diaphoresis, dyspnea and peripheral edema. ; ; Respiratory: Negative for cough, wheezing and stridor. ; ; Gastrointestinal: +diarrhea. Negative for nausea, vomiting, abdominal pain, blood in stool, hematemesis, jaundice and rectal bleeding. . ; ; Genitourinary: Negative for dysuria, flank pain and hematuria. ; ; Musculoskeletal: Negative for back pain and neck pain. Negative for swelling and trauma.; ; Skin: Negative for pruritus, rash, abrasions, blisters, bruising and skin lesion.; ; Neuro: Negative for headache, lightheadedness and neck stiffness. Negative for altered level of consciousness , altered mental status, extremity weakness, paresthesias, involuntary movement, seizure and syncope.      Allergies  Review of patient's allergies indicates no known allergies.  Home Medications   Prior to Admission medications   Medication Sig Start Date End Date Taking? Authorizing Provider  acetaminophen (TYLENOL) 325 MG tablet Take 2 tablets (650 mg total) by mouth 3 (three) times daily as needed for pain. 12/08/12   Rexene Alberts, MD  amLODipine (NORVASC) 5 MG tablet TAKE 1 TABLET EVERY DAY 11/04/13   Lendon Colonel, NP  aspirin 81 MG tablet Take 81 mg by mouth daily.    Historical Provider, MD  atorvastatin (LIPITOR) 80 MG tablet Take 1 tablet (80 mg total) by mouth daily at 6 PM. 07/11/13   Lendon Colonel, NP  BRILINTA 90 MG TABS tablet TAKE 1 TABLET TWICE A DAY    Lendon Colonel, NP  carvedilol (COREG) 6.25 MG tablet Take 1 tablet (6.25 mg total) by mouth 2 (two) times daily with a meal. Do not take this medication if your heart rate is less than 55 beats per minute. Discuss further with your primary doctor. 07/11/13   Lendon Colonel, NP  citalopram (CELEXA) 20 MG tablet TAKE 1/2 TABLET BY MOUTH EVERY DAY 10/04/13   Kathyrn Drown, MD  diclofenac sodium (VOLTAREN) 1 % GEL Apply to right knee twice daily for one more week, and twice daily only as  needed thereafter. 12/08/12   Rexene Alberts, MD  Multiple Vitamin (MULTIVITAMIN WITH MINERALS) TABS Take 1 tablet by mouth daily.    Historical Provider, MD  nitroGLYCERIN (NITROSTAT) 0.4 MG SL tablet Place 0.4 mg under the tongue every 5 (five) minutes as needed for chest pain.    Historical Provider, MD  thiamine 100 MG tablet Take 1 tablet (100 mg total) by mouth daily. B. vitamin. 12/08/12   Rexene Alberts, MD   BP 154/91  Pulse 65  Temp(Src) 98.9 F (37.2 C) (Oral)  Resp 17  Ht 6\' 3"  (1.905 m)  Wt 165 lb (74.844 kg)  BMI 20.62 kg/m2  SpO2 100% Physical Exam 1135: Physical examination:  Nursing notes reviewed; Vital signs and O2  SAT reviewed;  Constitutional: Well developed, Well nourished, Well hydrated, In no acute distress; Head:  Normocephalic, atraumatic; Eyes: EOMI, PERRL, No scleral icterus; ENMT: Mouth and pharynx normal, Mucous membranes moist; Neck: Supple, Full range of motion, No lymphadenopathy; Cardiovascular: Regular rate and rhythm, No gallop; Respiratory: Breath sounds clear & equal bilaterally, No rales, rhonchi, wheezes.  Speaking full sentences with ease, Normal respiratory effort/excursion; Chest: Nontender, Movement normal; Abdomen: Soft, Nontender, Nondistended, Normal bowel sounds; Genitourinary: No CVA tenderness; Extremities: Pulses normal, No tenderness, No edema, No calf edema or asymmetry.; Neuro: AA&Ox3, Major CN grossly intact.  Speech clear. Moves all extremities on stretcher without apparent gross focal motor deficits..; Skin: Color normal, Warm, Dry.   ED Course  Procedures     EKG Interpretation   Date/Time:  Monday November 25 2013 11:53:09 EDT Ventricular Rate:  53 PR Interval:  206 QRS Duration: 84 QT Interval:  441 QTC Calculation: 414 R Axis:   60 Text Interpretation:  Sinus rhythm Left ventricular hypertrophy with  repolarization abnormality Anterior infarct, old Borderline T  abnormalities, inferior leads When compared with ECG of  12/05/2012  Nonspecific T wave abnormality Inferior leads is now Present Confirmed by  [facility]  MD, Nunzio Cory [phone]) on 11/25/2013 12:12:56 PM      MDM  MDM Reviewed: previous chart, nursing note and vitals Reviewed previous: labs and ECG Interpretation: labs, ECG and x-ray    Results for orders placed during the hospital encounter of 11/25/13  URINALYSIS, ROUTINE W REFLEX MICROSCOPIC      Result Value Ref Range   Color, Urine YELLOW  YELLOW   APPearance CLEAR  CLEAR   Specific Gravity, Urine 1.020  1.005 - 1.030   pH 6.0  5.0 - 8.0   Glucose, UA NEGATIVE  NEGATIVE mg/dL   Hgb urine dipstick MODERATE (*) NEGATIVE   Bilirubin Urine NEGATIVE  NEGATIVE   Ketones, ur NEGATIVE  NEGATIVE mg/dL   Protein, ur >300 (*) NEGATIVE mg/dL   Urobilinogen, UA 0.2  0.0 - 1.0 mg/dL   Nitrite NEGATIVE  NEGATIVE   Leukocytes, UA NEGATIVE  NEGATIVE  CBC WITH DIFFERENTIAL      Result Value Ref Range   WBC 9.6  4.0 - 10.5 K/uL   RBC 3.91 (*) 4.22 - 5.81 MIL/uL   Hemoglobin 12.5 (*) 13.0 - 17.0 g/dL   HCT 35.8 (*) 39.0 - 52.0 %   MCV 91.6  78.0 - 100.0 fL   MCH 32.0  26.0 - 34.0 pg   MCHC 34.9  30.0 - 36.0 g/dL   RDW 14.7  11.5 - 15.5 %   Platelets 213  150 - 400 K/uL   Neutrophils Relative % 72  43 - 77 %   Neutro Abs 6.9  1.7 - 7.7 K/uL   Lymphocytes Relative 18  12 - 46 %   Lymphs Abs 1.7  0.7 - 4.0 K/uL   Monocytes Relative 9  3 - 12 %   Monocytes Absolute 0.9  0.1 - 1.0 K/uL   Eosinophils Relative 1  0 - 5 %   Eosinophils Absolute 0.1  0.0 - 0.7 K/uL   Basophils Relative 0  0 - 1 %   Basophils Absolute 0.0  0.0 - 0.1 K/uL  COMPREHENSIVE METABOLIC PANEL      Result Value Ref Range   Sodium 141  137 - 147 mEq/L   Potassium 4.6  3.7 - 5.3 mEq/L   Chloride 105  96 - 112 mEq/L   CO2 24  19 - 32 mEq/L   Glucose, Bld 90  70 - 99 mg/dL   BUN 27 (*) 6 - 23 mg/dL   Creatinine, Ser 2.61 (*) 0.50 - 1.35 mg/dL   Calcium 9.2  8.4 - 10.5 mg/dL   Total Protein 7.5  6.0 - 8.3 g/dL   Albumin  3.2 (*) 3.5 - 5.2 g/dL   AST 24  0 - 37 U/L   ALT 14  0 - 53 U/L   Alkaline Phosphatase 120 (*) 39 - 117 U/L   Total Bilirubin 0.4  0.3 - 1.2 mg/dL   GFR calc non Af Amer 23 (*) >90 mL/min   GFR calc Af Amer 27 (*) >90 mL/min   Anion gap 12  5 - 15  LIPASE, BLOOD      Result Value Ref Range   Lipase 78 (*) 11 - 59 U/L  LACTIC ACID, PLASMA      Result Value Ref Range   Lactic Acid, Venous 1.0  0.5 - 2.2 mmol/L  TROPONIN I      Result Value Ref Range   Troponin I <0.30  <0.30 ng/mL  URINE MICROSCOPIC-ADD ON      Result Value Ref Range   RBC / HPF TOO NUMEROUS TO COUNT  <3 RBC/hpf   Bacteria, UA MANY (*) RARE   Dg Abd Acute W/chest 11/25/2013   CLINICAL DATA:  Diarrhea  EXAM: ACUTE ABDOMEN SERIES (ABDOMEN 2 VIEW & CHEST 1 VIEW)  COMPARISON:  None.  FINDINGS: Normal heart size. Lungs hyper aerated and grossly clear. Aorto bi-iliac stent graft is in place. No disproportionate dilatation of bowel. No free intraperitoneal gas. Severe stool burden in the rectum. There is also stool throughout the transverse and descending colon.  IMPRESSION: Rectal fecal impaction.   Electronically Signed   By: Maryclare Bean M.D.   On: 11/25/2013 13:43    Results for [name], [name] (MRN [id number]) as of 11/25/2013 14:43  Ref. Range 12/07/2012 05:05 12/08/2012 06:17 04/16/2013 11:54 05/20/2013 11:17 11/25/2013 11:44  BUN Latest Range: 6-23 mg/dL 19 15 16 18 27  (H)  Creatinine Latest Range: 0.50-1.35 mg/dL 1.69 (H) 1.60 (H) 1.93 (H) 2.10 (H) 2.61 (H)    1430:  Pt has stooled multiple times while in the ED. Stool sent for GI pathogen panel and cdiff PCR. Unable to reach impaction on digital exam. VSS, abd remains benign. +orthostatic on VS and BUN/Cr elevated from baseline; will continue judicious IVF. Dx and testing d/w pt and family.  Questions answered.  Verb understanding, agreeable to admit. T/C to [facility]. Verlon Au, case discussed, including:  HPI, pertinent PM/SHx, VS/PE, dx testing, ED course and treatment:   Agreeable to admit, requests to write temporary orders, obtain tele bed to team 2.    Francine Graven, DO 11/26/13 1711

## 2013-11-25 NOTE — ED Notes (Signed)
Dr Samtani at bedside 

## 2013-11-26 ENCOUNTER — Observation Stay (HOSPITAL_COMMUNITY): Payer: Medicare Other

## 2013-11-26 LAB — BASIC METABOLIC PANEL
ANION GAP: 9 (ref 5–15)
BUN: 23 mg/dL (ref 6–23)
CO2: 24 mEq/L (ref 19–32)
CREATININE: 2.29 mg/dL — AB (ref 0.50–1.35)
Calcium: 8.8 mg/dL (ref 8.4–10.5)
Chloride: 108 mEq/L (ref 96–112)
GFR calc non Af Amer: 27 mL/min — ABNORMAL LOW (ref >90)
GFR, EST AFRICAN AMERICAN: 32 mL/min — AB (ref >90)
Glucose, Bld: 138 mg/dL — ABNORMAL HIGH (ref 70–99)
Potassium: 4.5 mEq/L (ref 3.7–5.3)
Sodium: 141 mEq/L (ref 137–147)

## 2013-11-26 LAB — COMPREHENSIVE METABOLIC PANEL
ALBUMIN: 2.7 g/dL — AB (ref 3.5–5.2)
ALT: 11 U/L (ref 0–53)
AST: 20 U/L (ref 0–37)
Alkaline Phosphatase: 100 U/L (ref 39–117)
Anion gap: 9 (ref 5–15)
BUN: 24 mg/dL — ABNORMAL HIGH (ref 6–23)
CALCIUM: 8.7 mg/dL (ref 8.4–10.5)
CO2: 24 mEq/L (ref 19–32)
Chloride: 110 mEq/L (ref 96–112)
Creatinine, Ser: 2.45 mg/dL — ABNORMAL HIGH (ref 0.50–1.35)
GFR calc non Af Amer: 25 mL/min — ABNORMAL LOW (ref >90)
GFR, EST AFRICAN AMERICAN: 29 mL/min — AB (ref >90)
GLUCOSE: 84 mg/dL (ref 70–99)
POTASSIUM: 5 meq/L (ref 3.7–5.3)
SODIUM: 143 meq/L (ref 137–147)
Total Bilirubin: 0.5 mg/dL (ref 0.3–1.2)
Total Protein: 6.4 g/dL (ref 6.0–8.3)

## 2013-11-26 LAB — GI PATHOGEN PANEL BY PCR, STOOL
C difficile toxin A/B: NEGATIVE
Campylobacter by PCR: NEGATIVE
Cryptosporidium by PCR: NEGATIVE
E COLI (STEC): NEGATIVE
E coli (ETEC) LT/ST: NEGATIVE
E coli 0157 by PCR: NEGATIVE
G lamblia by PCR: NEGATIVE
NOROVIRUS G1/G2: NEGATIVE
ROTAVIRUS A BY PCR: NEGATIVE
Salmonella by PCR: NEGATIVE
Shigella by PCR: NEGATIVE

## 2013-11-26 LAB — CBC
HEMATOCRIT: 32.1 % — AB (ref 39.0–52.0)
HEMOGLOBIN: 10.9 g/dL — AB (ref 13.0–17.0)
MCH: 31.5 pg (ref 26.0–34.0)
MCHC: 34 g/dL (ref 30.0–36.0)
MCV: 92.8 fL (ref 78.0–100.0)
Platelets: 193 10*3/uL (ref 150–400)
RBC: 3.46 MIL/uL — AB (ref 4.22–5.81)
RDW: 15 % (ref 11.5–15.5)
WBC: 8 10*3/uL (ref 4.0–10.5)

## 2013-11-26 LAB — LIPASE, BLOOD: Lipase: 135 U/L — ABNORMAL HIGH (ref 11–59)

## 2013-11-26 LAB — GLUCOSE, CAPILLARY: GLUCOSE-CAPILLARY: 77 mg/dL (ref 70–99)

## 2013-11-26 LAB — OCCULT BLOOD, POC DEVICE: FECAL OCCULT BLD: POSITIVE — AB

## 2013-11-26 LAB — PROTIME-INR
INR: 1.11 (ref 0.00–1.49)
Prothrombin Time: 14.3 seconds (ref 11.6–15.2)

## 2013-11-26 MED ORDER — MILK AND MOLASSES ENEMA: 1.0000 | Freq: Once | RECTAL | Status: DC

## 2013-11-26 MED ORDER — MILK AND MOLASSES ENEMA
1.0000 | Freq: Once | RECTAL | Status: AC
  Administered 2013-11-26: 250 mL via RECTAL

## 2013-11-26 NOTE — Progress Notes (Signed)
Patient received a milk and molasses enema today with a result of a large amount of formed brown stool

## 2013-11-26 NOTE — Evaluation (Signed)
Occupational Therapy Evaluation Patient Details Name: [name] MRN: [id number] DOB: 1946/02/12 Today's Date: 11/26/2013    History of Present Illness   68 y/o male, h/o COPD, Prior CKD stag3, CAD s/p MI 09/2011-stent BMS on ASa Brilinta/ICN, Vascular dementia, Htn, Tob abuse, AAA s/ endovascular repair c Cook Zenith device 01/16/2008, COlon 2006 Anal fissure-Polpy, H/o CVA 03/18/10 and multiple TIA's who presented to [facility] with an episode of confusion, dizziness and fall. At his baseline he does not really get up out of his bed but is able to at least get to the bedside.    Clinical Impression   Pt returning from test upon therapy arrival. Patient transferred into recliner to eat breakfast. Pt agreeable to participate in OT eval. Wife arrived to room shortly after start of eval. Wife reports that patient had a PT and OT evaluation done on Saturday for home health. Pt has episodes of TIA's and becomes weak for a few days before recovering. Patient spends most of his time in bed due to lack of motivation to get up and get dressed. Wife reports that patient is capable of doing more for himself than he does. Home health has been initiated to hopefully increase overall strength and motivate patient to get up and move. At this time, all OT services can be met at home with Home Health. Recommend D/C home with home health.    Follow Up Recommendations  Home health OT    Equipment Recommendations  None recommended by OT    Recommendations for Other Services  None     Precautions / Restrictions Precautions Precautions: Fall      Mobility  Transfers Overall transfer level: Needs assistance Equipment used: 1 person hand held assist Transfers: Stand Pivot Transfers   Stand pivot transfers: Min assist                 ADL Overall ADL's : At baseline                                       General ADL Comments: Min A for transfers. Independent with self feeding.                 Pertinent Vitals/Pain Pain Assessment: No/denies pain     Hand Dominance Right   Extremity/Trunk Assessment Upper Extremity Assessment Upper Extremity Assessment: Overall WFL for tasks assessed   Lower Extremity Assessment Lower Extremity Assessment: Defer to PT evaluation       Communication Communication Communication: No difficulties   Cognition Arousal/Alertness: Awake/alert Behavior During Therapy: WFL for tasks assessed/performed Overall Cognitive Status: Within Functional Limits for tasks assessed                                Home Living Family/patient expects to be discharged to:: Private residence Living Arrangements: Spouse/significant other Available Help at Discharge: Family Type of Home: House Home Access: Stairs to enter [employer] of Steps: 1 off the patio   Home Layout: One level     Bathroom Shower/Tub: Tub/shower unit         Home Equipment: Environmental consultant - 2 wheels;Shower seat;Other (comment);Wheelchair - manual;Cane - quad   Additional Comments: high rise toilet seat with handles.      Prior Functioning/Environment Level of Independence: Needs assistance  Gait / Transfers Assistance Needed: ambulates with walker  ADL's / Homemaking Assistance Needed: patient is able to dress himself although he lacks the motivation to do so. Wife baths him.                        Barriers to D/C:   None             End of Session Equipment Utilized During Treatment: Gait belt  Activity Tolerance: Patient tolerated treatment well Patient left: in chair;with family/visitor present   Time: 3958-4417 OT Time Calculation (min): 29 min Charges:  OT General Charges $OT Visit: 1 Procedure OT Evaluation $Initial OT Evaluation Tier I: 1 Procedure G-Codes: OT G-codes **NOT FOR INPATIENT CLASS** Functional Assessment Tool Used: clinical judgement Functional Limitation: Self care Self Care Current Status  (L2787): At least 20 percent but less than 40 percent impaired, limited or restricted Self Care Goal Status (Z8367): At least 20 percent but less than 40 percent impaired, limited or restricted Self Care Discharge Status [phone]): At least 20 percent but less than 40 percent impaired, limited or restricted  Ailene Ravel, OTR/L,CBIS   11/26/2013, 10:01 AM

## 2013-11-26 NOTE — Evaluation (Signed)
Physical Therapy Evaluation Patient Details Name: [name] MRN: [id number] DOB: 10/09/1945 Today's Date: 11/26/2013   History of Present Illness  Pt is a 68 year old male with history of COPD, MI, HTN, vascular dementia and multiple TIAs.  He is reported to stay in bed most of the time using a BSC as needed.  He is able to ambulate with a walker but is not motivated to do so.  Pt did complete a full cardiovascular program in 2013 and wife states that pt was quite strong following that program.  He has "gone downhill" since then.  Clinical Impression   Pt up in a chair, alert and oriented.  Wife was present for evaluation.  She states that he has occasional "spells" where he loses orientation and falls due to weakness.  He usually needs assistance to get up off of the floor.  Strength on manual muscle test is WNL however functional strength is just fair.  He is able to transfer without assist and is able to walk at least 275' using a walker with supervision.  He had no loss of balance during gait and did not appear to be excessively tired.  Pt isunable to explain why he chooses to stay in bed most of the time.  I have strongly encouraged him to increase his activity level at home.  He is planning to have HHPT at d/c.I will ask nursing service to ambulate pt while he is here in the hospital.    Follow Up Recommendations Home health PT    Equipment Recommendations  None recommended by PT    Recommendations for Other Services       Precautions / Restrictions Precautions Precautions: Fall Restrictions Weight Bearing Restrictions: No      Mobility  Bed Mobility Overal bed mobility: Independent                Transfers Overall transfer level: Modified independent Equipment used: Rolling walker (2 wheeled) Transfers: Sit to/from Stand Sit to Stand: Modified independent (Device/Increase time) Stand pivot transfers: Min assist          Ambulation/Gait Ambulation/Gait  assistance: Supervision Ambulation Distance (Feet): 275 Feet Assistive device: Rolling walker (2 wheeled) Gait Pattern/deviations: WFL(Within Functional Limits)   Gait velocity interpretation: at or above normal speed for age/gender    Stairs            Wheelchair Mobility    Modified Rankin (Stroke Patients Only)       Balance Overall balance assessment: History of Falls;Modified Independent (with walker, pt is stable)                                           Pertinent Vitals/Pain Pain Assessment: No/denies pain    Home Living Family/patient expects to be discharged to:: Private residence Living Arrangements: Spouse/significant other Available Help at Discharge: Family Type of Home: House Home Access: Stairs to enter   [employer] of Steps: 1 off the [place]: One level Home Equipment: Environmental consultant - 2 wheels;Shower seat;Other (comment);Wheelchair - manual;Cane - quad Additional Comments: high rise toilet seat with handles.    Prior Function Level of Independence: Needs assistance   Gait / Transfers Assistance Needed: ambulates with walker  ADL's / Homemaking Assistance Needed: patient is able to dress himself although he lacks the motivation to do so. Wife baths him.  Hand Dominance   Dominant Hand: Right    Extremity/Trunk Assessment   Upper Extremity Assessment: Defer to OT evaluation           Lower Extremity Assessment: Overall WFL for tasks assessed      Cervical / Trunk Assessment: Normal  Communication   Communication: No difficulties  Cognition Arousal/Alertness: Awake/alert Behavior During Therapy: WFL for tasks assessed/performed Overall Cognitive Status: Within Functional Limits for tasks assessed                      General Comments      Exercises        Assessment/Plan    PT Assessment Patent does not need any further PT services  PT Diagnosis     PT Problem List     PT Treatment Interventions     PT Goals (Current goals can be found in the Care Plan section) Acute Rehab PT Goals PT Goal Formulation: No goals set, d/c therapy    Frequency     Barriers to discharge  none      Co-evaluation               End of Session Equipment Utilized During Treatment: Gait belt Activity Tolerance: Patient tolerated treatment well Patient left: in chair;with call bell/phone within reach;with family/visitor present Nurse Communication: Mobility status    Functional Assessment Tool Used: clinical judgement Functional Limitation: Mobility: Walking and moving around Mobility: Walking and Moving Around Current Status ([id number]): At least 1 percent but less than 20 percent impaired, limited or restricted Mobility: Walking and Moving Around Goal Status [phone]): At least 1 percent but less than 20 percent impaired, limited or restricted Mobility: Walking and Moving Around Discharge Status [phone]): At least 1 percent but less than 20 percent impaired, limited or restricted    Time: 1018-1050 PT Time Calculation (min): 32 min   Charges:   PT Evaluation $Initial PT Evaluation Tier I: 1 Procedure     PT G Codes:   Functional Assessment Tool Used: clinical judgement Functional Limitation: Mobility: Walking and moving around    [place] 11/26/2013, 12:00 PM

## 2013-11-26 NOTE — Progress Notes (Signed)
Utilization review completed.  

## 2013-11-26 NOTE — Progress Notes (Signed)
Note: This document was prepared with digital dictation and possible smart phrase technology. Any transcriptional errors that result from this process are unintentional.   [name] KGU:[id number] DOB: 04/09/1945 DOA: 11/25/2013 PCP: [name], MD  Brief narrative: 68 y/o ?, h/o COPD, Prior CKD stag3, CAD s/p MI 09/2011-stent BMS on ASa Brilinta/ICN, Vascular dementia, Htn, Tob abuse, AAA s/ endovascular repair c Cook Zenith device 01/16/2008, Colon 2006 Anal fissure-Polpy, H/o CVA 03/18/10 and multiple TIA's who presented to [facility] with an episode of confusion, dizziness and fall. At his baseline he does not really get up out of his bed but is able to at least get to the bedside.  Past medical history-As per Problem list Chart reviewed as below- reviewed  Consultants:  none  Procedures:  Enema 9/7  MOM enema 9/8  Antibiotics:  none   Subjective   He is doing fair. He has no shortness of breath chest pain nausea vomiting blurred and double vision He is tolerating his diet well and ate 100% He has absolutely no abdominal pain but seems that some leakage of stool last night interest that Nursing reports he had a large stool He denies any fever any chills   Objective    Interim History: nad  Telemetry: Sinus rhythm   Objective: Filed Vitals:   11/25/13 1430 11/25/13 1522 11/25/13 2120 11/26/13 0656  BP: 173/87 195/74  162/64  Pulse: 62 55  55  Temp:  98.6 F (37 C) 98.3 F (36.8 C) 97.7 F (36.5 C)  TempSrc:   Oral Oral  Resp:  16 20 20   Height:  6\' 3"  (1.905 m)    Weight:  74.744 kg (164 lb 12.5 oz)    SpO2: 100% 100%  100%    Intake/Output Summary (Last 24 hours) at 11/26/13 1115 Last data filed at 11/25/13 1700  Gross per 24 hour  Intake 606.67 ml  Output      1 ml  Net 605.67 ml    Exam:  General: Alert pleasant oriented no apparent distress Cardiovascular: S1-S2 no murmur rub or gallop Respiratory: Clinically clear no added  sound Abdomen: Soft nontender nondistended no rebound or guarding Skin no lower extremity edema Neuro grossly intact  Data Reviewed: Basic Metabolic Panel:  Recent Labs Lab 11/25/13 1144 11/26/13 0524  NA 141 143  K 4.6 5.0  CL 105 110  CO2 24 24  GLUCOSE 90 84  BUN 27* 24*  CREATININE 2.61* 2.45*  CALCIUM 9.2 8.7   Liver Function Tests:  Recent Labs Lab 11/25/13 1144 11/26/13 0524  AST 24 20  ALT 14 11  ALKPHOS 120* 100  BILITOT 0.4 0.5  PROT 7.5 6.4  ALBUMIN 3.2* 2.7*    Recent Labs Lab 11/25/13 1144 11/26/13 0524  LIPASE 78* 135*   No results found for this basename: AMMONIA,  in the last 168 hours CBC:  Recent Labs Lab 11/25/13 1144 11/26/13 0524  WBC 9.6 8.0  NEUTROABS 6.9  --   HGB 12.5* 10.9*  HCT 35.8* 32.1*  MCV 91.6 92.8  PLT 213 193   Cardiac Enzymes:  Recent Labs Lab 11/25/13 1144  TROPONINI <0.30   BNP: No components found with this basename: POCBNP,  CBG:  Recent Labs Lab 11/26/13 0733  GLUCAP 77    Recent Results (from the past 240 hour(s))  CLOSTRIDIUM DIFFICILE BY PCR     Status: None   Collection Time    11/25/13  9:44 PM      Result  Value Ref Range Status   C difficile by pcr NEGATIVE  NEGATIVE Final     Studies:              All Imaging reviewed and is as per above notation   Scheduled Meds: . aspirin EC  81 mg Oral Daily  . atorvastatin  80 mg Oral q1800  . carvedilol  3.125 mg Oral BID WC  . citalopram  10 mg Oral Daily  . heparin  5,000 Units Subcutaneous 3 times per day  . milk and molasses  1 enema Rectal Once  . multivitamin with minerals  1 tablet Oral Daily   Continuous Infusions: . sodium chloride 100 mL/hr at 11/26/13 0542     Assessment/Plan:  1. Fecal impaction-per patient there is still some leakage of liquid stool. I will repeat of abdominal x-ray in the a.m. 9/9 as well as get a milk of molasses enema. 2. acute kidney injury superimposed on chronic component-see above .  continue IV  saline 100 cc per hour . Repeat labs in a.m.  his baseline creatinine is in the 2 range  3. orthostasis, dizzy-continue saline at 100 cc per hour at least overnight and reassess orthostatics -He was up in the chair this morning however he showed' 30 systolic from 115/72-620/35 and we will continue saline as well as education regarding orthostasis therapy is recommended 4. Tobacco  occasional smoker. Needs outpatient counseling  5. Abdominal aneurysm without mention of rupture-need outpatient monitoring by primary care physician  6. CAD (coronary artery disease)--continue Brilli with 90 daily, aspirin 81 daily. Will continue his Coreg at a lower dose of 3.125 daily and monitor  7. Chronic systolic CHF (congestive heart failure)-see above discussion-he has no evidence of any heart failure is  8. Fall at [place] evaluated the patient 11/26/13 and he may benefit from home health 9. UTI (urinary tract infection)-monitor urine culture unlikely to be acute infection-hold Plavix until urine culture. 10. proteinuria-he has had some now marked proteinuria he may benefit from ACE inhibitor in the long-term once his renal insufficiency is resolved   11. Elevated alkaline phosphatase, lipase-? Pancreatitis.-still he is nontender nondistended ultrasound abdomen pelvis 9/7 shows multiple gallstones.  He may benefit from interval cholecystectomy once he stabilizes in terms of creatinine and should be referred to general surgeon with an appointment on discharge   Code Status: Full  Family Communication: Discussed patient's care with wife at bedside Disposition Plan: Inpatient   Verneita Griffes, MD  [facility] Pager [phone] 11/26/2013, 11:15 AM    LOS: 1 day

## 2013-11-27 ENCOUNTER — Observation Stay (HOSPITAL_COMMUNITY): Payer: Medicare Other

## 2013-11-27 DIAGNOSIS — K5641 Fecal impaction: Principal | ICD-10-CM

## 2013-11-27 DIAGNOSIS — E860 Dehydration: Secondary | ICD-10-CM

## 2013-11-27 DIAGNOSIS — I5022 Chronic systolic (congestive) heart failure: Secondary | ICD-10-CM

## 2013-11-27 DIAGNOSIS — I951 Orthostatic hypotension: Secondary | ICD-10-CM

## 2013-11-27 DIAGNOSIS — R197 Diarrhea, unspecified: Secondary | ICD-10-CM

## 2013-11-27 DIAGNOSIS — I509 Heart failure, unspecified: Secondary | ICD-10-CM

## 2013-11-27 DIAGNOSIS — N179 Acute kidney failure, unspecified: Secondary | ICD-10-CM

## 2013-11-27 LAB — GLUCOSE, CAPILLARY: GLUCOSE-CAPILLARY: 78 mg/dL (ref 70–99)

## 2013-11-27 LAB — URINE CULTURE
Colony Count: NO GROWTH
Culture: NO GROWTH

## 2013-11-27 LAB — BASIC METABOLIC PANEL
Anion gap: 9 (ref 5–15)
BUN: 20 mg/dL (ref 6–23)
CALCIUM: 8.5 mg/dL (ref 8.4–10.5)
CHLORIDE: 112 meq/L (ref 96–112)
CO2: 23 mEq/L (ref 19–32)
CREATININE: 2.15 mg/dL — AB (ref 0.50–1.35)
GFR calc non Af Amer: 30 mL/min — ABNORMAL LOW (ref >90)
GFR, EST AFRICAN AMERICAN: 34 mL/min — AB (ref >90)
Glucose, Bld: 81 mg/dL (ref 70–99)
Potassium: 4.7 mEq/L (ref 3.7–5.3)
Sodium: 144 mEq/L (ref 137–147)

## 2013-11-27 MED ORDER — TICAGRELOR 90 MG PO TABS
ORAL_TABLET | ORAL | Status: AC
  Filled 2013-11-27: qty 1

## 2013-11-27 MED ORDER — POLYETHYLENE GLYCOL 3350 17 G PO PACK
17.0000 g | PACK | Freq: Every day | ORAL | Status: DC
  Administered 2013-11-27 – 2013-11-29 (×3): 17 g via ORAL
  Filled 2013-11-27 (×3): qty 1

## 2013-11-27 MED ORDER — TICAGRELOR 90 MG PO TABS
90.0000 mg | ORAL_TABLET | Freq: Two times a day (BID) | ORAL | Status: DC
  Administered 2013-11-27 – 2013-11-29 (×4): 90 mg via ORAL
  Filled 2013-11-27 (×10): qty 1

## 2013-11-27 NOTE — Progress Notes (Signed)
Note: This document was prepared with digital dictation and possible smart phrase technology. Any transcriptional errors that result from this process are unintentional.   [name] ESP:[id number] DOB: 12-08-1945 DOA: 11/25/2013 PCP: [name], MD  Brief narrative: 68 y/o ?, h/o COPD, Prior CKD stag3, CAD s/p MI 09/2011-stent BMS on ASa Brilinta/ICN, Vascular dementia, Htn, Tob abuse, AAA s/ endovascular repair c Cook Zenith device 01/16/2008, Colon 2006 Anal fissure-Polpy, H/o CVA 03/18/10 and multiple TIA's who presented to [facility] with an episode of confusion, dizziness and fall. At his baseline he does not really get up out of his bed but is able to at least get to the bedside.  Past medical history-As per Problem list Chart reviewed as below- reviewed  Consultants:  none  Procedures:  Enema 9/7  MOM enema 9/8  Antibiotics:  none   Subjective   Reports that he does still feel occasionally dizzy, but feels that this may become more of a chronic process. Denies any chest pain or shortness of breath.   Objective    Interim History: nad  Telemetry: Sinus rhythm   Objective: Filed Vitals:   11/26/13 1410 11/26/13 2231 11/27/13 0444 11/27/13 1351  BP: 158/66 156/68 156/56   Pulse: 60 51 50 49  Temp: 97.6 F (36.4 C) 98.9 F (37.2 C) 97 F (36.1 C) 97.3 F (36.3 C)  TempSrc:  Oral Oral Oral  Resp: 20 20 20 20   Height:      Weight:      SpO2: 100% 98% 100% 100%    Intake/Output Summary (Last 24 hours) at 11/27/13 2011 Last data filed at 11/27/13 1829  Gross per 24 hour  Intake    840 ml  Output   1975 ml  Net  -1135 ml    Exam:  General: Alert pleasant oriented no apparent distress Cardiovascular: S1-S2 no murmur rub or gallop Respiratory: Clinically clear no added sound Abdomen: Soft nontender nondistended no rebound or guarding Skin no lower extremity edema Neuro grossly intact  Data Reviewed: Basic Metabolic Panel:  Recent Labs Lab  11/25/13 1144 11/26/13 0524 11/26/13 1201 11/27/13 0531  NA 141 143 141 144  K 4.6 5.0 4.5 4.7  CL 105 110 108 112  CO2 24 24 24 23   GLUCOSE 90 84 138* 81  BUN 27* 24* 23 20  CREATININE 2.61* 2.45* 2.29* 2.15*  CALCIUM 9.2 8.7 8.8 8.5   Liver Function Tests:  Recent Labs Lab 11/25/13 1144 11/26/13 0524  AST 24 20  ALT 14 11  ALKPHOS 120* 100  BILITOT 0.4 0.5  PROT 7.5 6.4  ALBUMIN 3.2* 2.7*    Recent Labs Lab 11/25/13 1144 11/26/13 0524  LIPASE 78* 135*   No results found for this basename: AMMONIA,  in the last 168 hours CBC:  Recent Labs Lab 11/25/13 1144 11/26/13 0524  WBC 9.6 8.0  NEUTROABS 6.9  --   HGB 12.5* 10.9*  HCT 35.8* 32.1*  MCV 91.6 92.8  PLT 213 193   Cardiac Enzymes:  Recent Labs Lab 11/25/13 1144  TROPONINI <0.30   BNP: No components found with this basename: POCBNP,  CBG:  Recent Labs Lab 11/26/13 0733 11/27/13 0731  GLUCAP 77 78    Recent Results (from the past 240 hour(s))  URINE CULTURE     Status: None   Collection Time    11/25/13 12:17 PM      Result Value Ref Range Status   Specimen Description URINE, CATHETERIZED   Final  Special Requests NONE   Final   Culture  Setup Time     Final   Value: 11/25/2013 23:22     Performed at [place] Count     Final   Value: NO GROWTH     Performed at [facility]   Culture     Final   Value: NO GROWTH     Performed at [facility]   Report Status 11/27/2013 FINAL   Final  CLOSTRIDIUM DIFFICILE BY PCR     Status: None   Collection Time    11/25/13  9:44 PM      Result Value Ref Range Status   C difficile by pcr NEGATIVE  NEGATIVE Final     Studies:              All Imaging reviewed and is as per above notation   Scheduled Meds: . aspirin EC  81 mg Oral Daily  . atorvastatin  80 mg Oral q1800  . carvedilol  3.125 mg Oral BID WC  . citalopram  10 mg Oral Daily  . heparin  5,000 Units Subcutaneous 3 times per day  .  multivitamin with minerals  1 tablet Oral Daily  . polyethylene glycol  17 g Oral Daily  . ticagrelor  90 mg Oral BID   Continuous Infusions: . sodium chloride 100 mL/hr at 11/27/13 1010     Assessment/Plan:  1. Fecal impaction-per patient there is still some leakage of liquid stool. Repeat abdominal xray shows resolution of fecal impaction after milk and molasses enema.  Will continue daily miralax to promote regular bowel movements. 2. acute kidney injury superimposed on chronic component-see above .  continue IV saline 100 cc per hour . Repeat labs in a.m.  his baseline creatinine is in the 2 range  3. orthostasis, dizzy-continue intravenous saline for today.  Can likely discontinue in am.  Orthostatics are improving. 4. Tobacco  occasional smoker. Needs outpatient counseling  5. Abdominal aneurysm without mention of rupture-need outpatient monitoring by primary care physician  6. CAD (coronary artery disease)--continue Brillinta, aspirin 81 daily. Will continue his Coreg at a lower dose of 3.125 daily and monitor  7. Chronic systolic CHF (congestive heart failure)-see above discussion-appears compensated  8. Fall at [facility] evaluated the patient 11/26/13 and he may benefit from home health 9. Possible UTI (urinary tract infection)- urine culture shows no growth. 10. proteinuria-he has had some now marked proteinuria he may benefit from ACE inhibitor in the long-term once his renal insufficiency is resolved   11. Elevated alkaline phosphatase, lipase-? Pancreatitis.-still he is nontender nondistended ultrasound abdomen pelvis 9/7 shows multiple gallstones.  He may benefit from interval cholecystectomy once he stabilizes in terms of creatinine and should be referred to general surgeon with an appointment on discharge   Code Status: Full  Family Communication: no family present Disposition Plan: Inpatient   Kathie Dike, MD  [facility] Pager [phone]  11/27/2013, 8:11  PM    LOS: 2 days

## 2013-11-27 NOTE — Plan of Care (Signed)
Pt was on Contact precautions due to poss CDIFF or loose stools.  Pt educated as to why.  However, since pt succeeded in having LG formed stool 9/8 and CDIFF lab was negative - pt removed from contact isolation.

## 2013-11-28 DIAGNOSIS — E860 Dehydration: Secondary | ICD-10-CM | POA: Diagnosis present

## 2013-11-28 DIAGNOSIS — J440 Chronic obstructive pulmonary disease with acute lower respiratory infection: Secondary | ICD-10-CM | POA: Diagnosis present

## 2013-11-28 DIAGNOSIS — Z91199 Patient's noncompliance with other medical treatment and regimen due to unspecified reason: Secondary | ICD-10-CM | POA: Diagnosis not present

## 2013-11-28 DIAGNOSIS — Z8249 Family history of ischemic heart disease and other diseases of the circulatory system: Secondary | ICD-10-CM | POA: Diagnosis not present

## 2013-11-28 DIAGNOSIS — I4891 Unspecified atrial fibrillation: Secondary | ICD-10-CM | POA: Diagnosis present

## 2013-11-28 DIAGNOSIS — F0150 Vascular dementia without behavioral disturbance: Secondary | ICD-10-CM | POA: Diagnosis present

## 2013-11-28 DIAGNOSIS — I951 Orthostatic hypotension: Secondary | ICD-10-CM | POA: Diagnosis present

## 2013-11-28 DIAGNOSIS — N183 Chronic kidney disease, stage 3 unspecified: Secondary | ICD-10-CM

## 2013-11-28 DIAGNOSIS — I509 Heart failure, unspecified: Secondary | ICD-10-CM | POA: Diagnosis present

## 2013-11-28 DIAGNOSIS — Y92009 Unspecified place in unspecified non-institutional (private) residence as the place of occurrence of the external cause: Secondary | ICD-10-CM | POA: Diagnosis not present

## 2013-11-28 DIAGNOSIS — R748 Abnormal levels of other serum enzymes: Secondary | ICD-10-CM | POA: Diagnosis present

## 2013-11-28 DIAGNOSIS — N179 Acute kidney failure, unspecified: Secondary | ICD-10-CM | POA: Diagnosis present

## 2013-11-28 DIAGNOSIS — W19XXXA Unspecified fall, initial encounter: Secondary | ICD-10-CM | POA: Diagnosis present

## 2013-11-28 DIAGNOSIS — Z9119 Patient's noncompliance with other medical treatment and regimen: Secondary | ICD-10-CM | POA: Diagnosis not present

## 2013-11-28 DIAGNOSIS — I2510 Atherosclerotic heart disease of native coronary artery without angina pectoris: Secondary | ICD-10-CM | POA: Diagnosis present

## 2013-11-28 DIAGNOSIS — N390 Urinary tract infection, site not specified: Secondary | ICD-10-CM | POA: Diagnosis present

## 2013-11-28 DIAGNOSIS — Z9181 History of falling: Secondary | ICD-10-CM | POA: Diagnosis not present

## 2013-11-28 DIAGNOSIS — I714 Abdominal aortic aneurysm, without rupture, unspecified: Secondary | ICD-10-CM | POA: Diagnosis present

## 2013-11-28 DIAGNOSIS — D649 Anemia, unspecified: Secondary | ICD-10-CM | POA: Diagnosis present

## 2013-11-28 DIAGNOSIS — R42 Dizziness and giddiness: Secondary | ICD-10-CM | POA: Diagnosis present

## 2013-11-28 DIAGNOSIS — I498 Other specified cardiac arrhythmias: Secondary | ICD-10-CM | POA: Diagnosis present

## 2013-11-28 DIAGNOSIS — K5641 Fecal impaction: Secondary | ICD-10-CM | POA: Diagnosis present

## 2013-11-28 DIAGNOSIS — I428 Other cardiomyopathies: Secondary | ICD-10-CM | POA: Diagnosis present

## 2013-11-28 DIAGNOSIS — I252 Old myocardial infarction: Secondary | ICD-10-CM | POA: Diagnosis not present

## 2013-11-28 DIAGNOSIS — I129 Hypertensive chronic kidney disease with stage 1 through stage 4 chronic kidney disease, or unspecified chronic kidney disease: Secondary | ICD-10-CM | POA: Diagnosis present

## 2013-11-28 DIAGNOSIS — E785 Hyperlipidemia, unspecified: Secondary | ICD-10-CM | POA: Diagnosis present

## 2013-11-28 DIAGNOSIS — N39490 Overflow incontinence: Secondary | ICD-10-CM | POA: Diagnosis present

## 2013-11-28 DIAGNOSIS — F17200 Nicotine dependence, unspecified, uncomplicated: Secondary | ICD-10-CM | POA: Diagnosis present

## 2013-11-28 DIAGNOSIS — I5022 Chronic systolic (congestive) heart failure: Secondary | ICD-10-CM | POA: Diagnosis present

## 2013-11-28 DIAGNOSIS — Z8673 Personal history of transient ischemic attack (TIA), and cerebral infarction without residual deficits: Secondary | ICD-10-CM | POA: Diagnosis not present

## 2013-11-28 LAB — BASIC METABOLIC PANEL
ANION GAP: 9 (ref 5–15)
BUN: 18 mg/dL (ref 6–23)
CHLORIDE: 112 meq/L (ref 96–112)
CO2: 24 meq/L (ref 19–32)
Calcium: 8.5 mg/dL (ref 8.4–10.5)
Creatinine, Ser: 2.1 mg/dL — ABNORMAL HIGH (ref 0.50–1.35)
GFR calc Af Amer: 35 mL/min — ABNORMAL LOW (ref >90)
GFR calc non Af Amer: 30 mL/min — ABNORMAL LOW (ref >90)
Glucose, Bld: 85 mg/dL (ref 70–99)
POTASSIUM: 4.8 meq/L (ref 3.7–5.3)
SODIUM: 145 meq/L (ref 137–147)

## 2013-11-28 LAB — CBC
HCT: 28.9 % — ABNORMAL LOW (ref 39.0–52.0)
Hemoglobin: 9.9 g/dL — ABNORMAL LOW (ref 13.0–17.0)
MCH: 31.3 pg (ref 26.0–34.0)
MCHC: 34.3 g/dL (ref 30.0–36.0)
MCV: 91.5 fL (ref 78.0–100.0)
PLATELETS: 159 10*3/uL (ref 150–400)
RBC: 3.16 MIL/uL — AB (ref 4.22–5.81)
RDW: 14.8 % (ref 11.5–15.5)
WBC: 5.8 10*3/uL (ref 4.0–10.5)

## 2013-11-28 LAB — GLUCOSE, CAPILLARY: GLUCOSE-CAPILLARY: 81 mg/dL (ref 70–99)

## 2013-11-28 NOTE — Progress Notes (Signed)
Telemetry notified nurse of pt's rhythm going in and out of 2nd degree heart block, type 2. Nurse in to check on pt. Pt is asymptomatic at this time. Pt states, " I feel just fine ". Notified Dr Shanon Brow. Will continue to monitor pt frequently throughout night. Bed is in lowest position & call bell is within reach.

## 2013-11-28 NOTE — Progress Notes (Signed)
Pt ambulated with walker and gait belt from room, to nurses station, and back. No difficulty noted. Pt voiced no complaints. Denied dizziness, lightheadedness, or fatigue. Pt walked slow, but steady with nurse. After walk, pt in bed at lowest position, call bell within reach & bed alarm "on". Will continue to monitor pt frequently throughout night.

## 2013-11-28 NOTE — Progress Notes (Signed)
Vital signs taken & stable, EKG completed. Pt still remains asymptomatic at this time. Continuing to monitor.

## 2013-11-28 NOTE — Progress Notes (Signed)
Called by RN for concerning heart rhythm.  Had tele upload his strips to epic in order for my review with cardiologist dr patel at [facility] tonight.  Particularly around page 13 of the 40 something strips uploaded, does not appear after my review together with dr patel that this is second degree heart block type 2, he does not think this is a life threatening arrythmia.  Cont to monitor on tele.

## 2013-11-28 NOTE — Progress Notes (Signed)
Note: This document was prepared with digital dictation and possible smart phrase technology. Any transcriptional errors that result from this process are unintentional.   BREVEN GUIDROZ ZMO:[id number] DOB: 1945/07/30 DOA: 11/25/2013 PCP: [name], MD  Brief narrative: 68 y/o ?, h/o COPD, Prior CKD stag3, CAD s/p MI 09/2011-stent BMS on ASa Brilinta/ICN, Vascular dementia, Htn, Tob abuse, AAA s/ endovascular repair c Cook Zenith device 01/16/2008, Colon 2006 Anal fissure-Polpy, H/o CVA 03/18/10 and multiple TIA's who presented to [facility] with an episode of confusion, dizziness and fall. At his baseline he does not really get up out of his bed but is able to at least get to the bedside.  Past medical history-As per Problem list Chart reviewed as below- reviewed  Consultants:  none  Procedures:  Enema 9/7  MOM enema 9/8  Antibiotics:  none   Subjective   Reports that he does still feel occasionally dizzy, but feels that this may become more of a chronic process. Denies any chest pain or shortness of breath.   Objective    Interim History: nad  Telemetry: Sinus rhythm   Objective: Filed Vitals:   11/28/13 1401 11/28/13 1752 11/28/13 1754 11/28/13 1759  BP: 165/74     Pulse: 53     Temp: 98.4 F (36.9 C)     TempSrc: Oral     Resp: 20     Height:      Weight:      SpO2: 100% 99% 100% 100%    Intake/Output Summary (Last 24 hours) at 11/28/13 1951 Last data filed at 11/28/13 1801  Gross per 24 hour  Intake    630 ml  Output    500 ml  Net    130 ml    Exam:  General: Alert pleasant oriented no apparent distress Cardiovascular: S1-S2 no murmur rub or gallop Respiratory: Clinically clear no added sound Abdomen: Soft nontender nondistended no rebound or guarding Skin no lower extremity edema Neuro grossly intact  Data Reviewed: Basic Metabolic Panel:  Recent Labs Lab 11/25/13 1144 11/26/13 0524 11/26/13 1201 11/27/13 0531 11/28/13 0601  NA  141 143 141 144 145  K 4.6 5.0 4.5 4.7 4.8  CL 105 110 108 112 112  CO2 24 24 24 23 24   GLUCOSE 90 84 138* 81 85  BUN 27* 24* 23 20 18   CREATININE 2.61* 2.45* 2.29* 2.15* 2.10*  CALCIUM 9.2 8.7 8.8 8.5 8.5   Liver Function Tests:  Recent Labs Lab 11/25/13 1144 11/26/13 0524  AST 24 20  ALT 14 11  ALKPHOS 120* 100  BILITOT 0.4 0.5  PROT 7.5 6.4  ALBUMIN 3.2* 2.7*    Recent Labs Lab 11/25/13 1144 11/26/13 0524  LIPASE 78* 135*   No results found for this basename: AMMONIA,  in the last 168 hours CBC:  Recent Labs Lab 11/25/13 1144 11/26/13 0524 11/28/13 1837  WBC 9.6 8.0 5.8  NEUTROABS 6.9  --   --   HGB 12.5* 10.9* 9.9*  HCT 35.8* 32.1* 28.9*  MCV 91.6 92.8 91.5  PLT 213 193 159   Cardiac Enzymes:  Recent Labs Lab 11/25/13 1144  TROPONINI <0.30   BNP: No components found with this basename: POCBNP,  CBG:  Recent Labs Lab 11/26/13 0733 11/27/13 0731 11/28/13 0730  GLUCAP 77 78 81    Recent Results (from the past 240 hour(s))  URINE CULTURE     Status: None   Collection Time    11/25/13 12:17 PM  Result Value Ref Range Status   Specimen Description URINE, CATHETERIZED   Final   Special Requests NONE   Final   Culture  Setup Time     Final   Value: 11/25/2013 23:22     Performed at [place] Count     Final   Value: NO GROWTH     Performed at [facility]   Culture     Final   Value: NO GROWTH     Performed at [facility]   Report Status 11/27/2013 FINAL   Final  CLOSTRIDIUM DIFFICILE BY PCR     Status: None   Collection Time    11/25/13  9:44 PM      Result Value Ref Range Status   C difficile by pcr NEGATIVE  NEGATIVE Final     Studies:              All Imaging reviewed and is as per above notation   Scheduled Meds: . aspirin EC  81 mg Oral Daily  . atorvastatin  80 mg Oral q1800  . citalopram  10 mg Oral Daily  . heparin  5,000 Units Subcutaneous 3 times per day  . multivitamin with  minerals  1 tablet Oral Daily  . polyethylene glycol  17 g Oral Daily  . ticagrelor  90 mg Oral BID   Continuous Infusions:     Assessment/Plan:  1. Fecal impaction-per patient there is still some leakage of liquid stool. Repeat abdominal xray shows resolution of fecal impaction after milk and molasses enema.  Will continue daily miralax to promote regular bowel movements. 2. acute kidney injury superimposed on chronic component-see above .  continue IV saline 100 cc per hour . Creatinine appears to be approaching his baseline range around 2.  3. orthostasis, dizzy-continue intravenous saline for today.  Can likely discontinue in am.  Orthostatics are improving. 4. Tobacco  occasional smoker. Needs outpatient counseling  5. Abdominal aneurysm without mention of rupture-need outpatient monitoring by primary care physician  6. CAD (coronary artery disease)-patient last had a bare-metal stent placed in 2013. Followup cardiology notes indicate that he is only on aspirin. His wife insisted he is still taking Brillinta. We'll need to followup with cardiology 7. Chronic systolic CHF (congestive heart failure)-see above discussion-appears compensated  8. Fall at [place] evaluated the patient 11/26/13 and he may benefit from home health 9. Possible UTI (urinary tract infection)- urine culture shows no growth. 10. proteinuria-he has had some now marked proteinuria he may benefit from ACE inhibitor in the long-term once his renal insufficiency is resolved   11. Elevated alkaline phosphatase, lipase-? Pancreatitis.-still he is nontender nondistended ultrasound abdomen pelvis 9/7 shows multiple gallstones.  He may benefit from interval cholecystectomy once he stabilizes in terms of creatinine and should be referred to general surgeon with an appointment on discharge  12. Sinus bradycardia. Patient has had heart rates in the 40s. He still feels lightheaded. Coreg was discontinued on 9/9. We'll continue to  monitor for further bradycardia. Check TSH 13. Heme positive stools. Significance is unclear. Continue to monitor hemoglobin. He is on antiplatelet agents. His wife reports that he had a colonoscopy in 2012, where polyps were removed. May need to consult with GI.  Code Status: Full  Family Communication: discussed with wife at the bedside Disposition Plan: Inpatient   Kathie Dike, MD  [facility] Pager [phone]  11/28/2013, 7:51 PM    LOS: 3 days

## 2013-11-29 LAB — TSH
TSH: 2.33 u[IU]/mL (ref 0.350–4.500)
TSH: 2.05 u[IU]/mL (ref 0.350–4.500)

## 2013-11-29 LAB — BASIC METABOLIC PANEL
ANION GAP: 11 (ref 5–15)
BUN: 17 mg/dL (ref 6–23)
CHLORIDE: 111 meq/L (ref 96–112)
CO2: 23 mEq/L (ref 19–32)
Calcium: 8.7 mg/dL (ref 8.4–10.5)
Creatinine, Ser: 2.21 mg/dL — ABNORMAL HIGH (ref 0.50–1.35)
GFR calc Af Amer: 33 mL/min — ABNORMAL LOW (ref >90)
GFR calc non Af Amer: 29 mL/min — ABNORMAL LOW (ref >90)
Glucose, Bld: 85 mg/dL (ref 70–99)
POTASSIUM: 4.4 meq/L (ref 3.7–5.3)
Sodium: 145 mEq/L (ref 137–147)

## 2013-11-29 LAB — GLUCOSE, CAPILLARY: GLUCOSE-CAPILLARY: 79 mg/dL (ref 70–99)

## 2013-11-29 LAB — CBC
HEMATOCRIT: 30.5 % — AB (ref 39.0–52.0)
Hemoglobin: 10.5 g/dL — ABNORMAL LOW (ref 13.0–17.0)
MCH: 31.6 pg (ref 26.0–34.0)
MCHC: 34.4 g/dL (ref 30.0–36.0)
MCV: 91.9 fL (ref 78.0–100.0)
Platelets: 165 10*3/uL (ref 150–400)
RBC: 3.32 MIL/uL — AB (ref 4.22–5.81)
RDW: 14.9 % (ref 11.5–15.5)
WBC: 6.2 10*3/uL (ref 4.0–10.5)

## 2013-11-29 MED ORDER — POLYETHYLENE GLYCOL 3350 17 G PO PACK: 17.0000 g | PACK | Freq: Every day | ORAL | Status: AC

## 2013-11-29 MED ORDER — PANTOPRAZOLE SODIUM 40 MG PO TBEC: 40.0000 mg | DELAYED_RELEASE_TABLET | Freq: Every day | ORAL | Status: DC

## 2013-11-29 NOTE — Discharge Summary (Signed)
Physician Discharge Summary  [name]:[id number] DOB: 03-25-1945 DOA: 11/25/2013  PCP: [name], MD  Admit date: 11/25/2013 Discharge date: 11/29/2013  Time spent: 40 minutes  Recommendations for Outpatient Follow-up:  1. Follow up with cardiology in 2 week 2. Follow up with GI in 2-3 weeks for evaluation of heme positive stools 3. Consider outpatient referral to general surgery for evaluation of gallbladder and need for cholecystectomy  Discharge Diagnoses:  Principal Problem:   Fecal impaction with overflow incontinence of loose stool unlikely infectious Active Problems:   Tobacco abuse   Abdominal aneurysm without mention of rupture   CAD (coronary artery disease)   Chronic systolic CHF (congestive heart failure)   CKD (chronic kidney disease), stage III   Fall at home   Acute bronchitis   UTI (urinary tract infection)   Acute kidney injury  heme positive stools Sinus bradycardia Orthostatic hypotension  Discharge Condition: improved  Diet recommendation: low salt  Filed Weights   11/25/13 1101 11/25/13 1522  Weight: 74.844 kg (165 lb) 74.744 kg (164 lb 12.5 oz)    History of present illness and hospital course:  This patient was admitted to the hospital after feeling lightheaded, dizzy. He was found to have acute kidney injury on chronic kidney disease, dehydration and was initially felt to possible UTI. The patient was hydrated with IV fluids. His renal function has not returned back to baseline. Orthostasis has resolved. Patient was continued on his regular cardiac medications including beta blockers, aspirin brillinta. He was noted to have episodes of bradycardia in the hospital and his beta blocker was discontinued. TSH was found to be normal. His bradycardia has since improved and her return on the 22s. He was continued on aspirin, but after conversation with cardiology, brillinta was discontinued since he is 2 years out from his last cardiac stent. At this  time, his blood pressure and heart rate stable. He's otherwise asymptomatic and ambulating without significant difficulty.  The patient was incidentally noted to have heme positive stools. He did not have any gross evidence of bleeding. He reports having colonoscopy several years ago by Dr. Oneida Alar. His hemoglobin has been stable. He will followup with Dr. Oneida Alar in the outpatient setting to consider further workup including endoscopy. Will start him on proton pump inhibitors for now.  He was also incidentally noted to have an elevated alkaline phosphatase. Ultrasound of the abdomen showed multiple gallstones. He did not have a thickened gallbladder. He may benefit from outpatient surgical evaluation for consideration of cholecystectomy. He also was noted to have significant fecal impaction on abdominal imaging. This resolved with milk and molasses enema. He'll be continued on daily MiraLAX and have regular bowel movements  Procedures:    Consultations:    Discharge Exam: Filed Vitals:   11/29/13 1409  BP: 112/71  Pulse: 70  Temp: 98.3 F (36.8 C)  Resp: 18    General: NAD Cardiovascular: s1, s2, rrr Respiratory: CTA B  Discharge Instructions You were cared for by a hospitalist during your hospital stay. If you have any questions about your discharge medications or the care you received while you were in the hospital after you are discharged, you can call the unit and asked to speak with the hospitalist on call if the hospitalist that took care of you is not available. Once you are discharged, your primary care physician will handle any further medical issues. Please note that NO REFILLS for any discharge medications will be authorized once you are discharged, as it  is imperative that you return to your primary care physician (or establish a relationship with a primary care physician if you do not have one) for your aftercare needs so that they can reassess your need for medications and  monitor your lab values.  Discharge Instructions   Call MD for:  extreme fatigue    Complete by:  As directed      Call MD for:  persistant dizziness or light-headedness    Complete by:  As directed      Diet - low sodium heart healthy    Complete by:  As directed      Face-to-face encounter (required for Medicare/Medicaid patients)    Complete by:  As directed   I Samaia Iwata certify that this patient is under my care and that I, or a nurse practitioner or physician's assistant working with me, had a face-to-face encounter that meets the physician face-to-face encounter requirements with this patient on 11/29/2013. The encounter with the patient was in whole, or in part for the following medical condition(s) which is the primary reason for home health care (List medical condition): dehydration, orthostatic hypotension, CAD and deconditioning. Needs home health RN and PT  The encounter with the patient was in whole, or in part, for the following medical condition, which is the primary reason for home health care:  dehydration and acute renal failuire  I certify that, based on my findings, the following services are medically necessary home health services:   Nursing Physical therapy    My clinical findings support the need for the above services:  Unable to leave home safely without assistance and/or assistive device  Further, I certify that my clinical findings support that this patient is homebound due to:  Mental confusion  Reason for Medically Necessary Home Health Services:  Skilled Nursing- Skilled Assessment/Observation     Home Health    Complete by:  As directed   To provide the following care/treatments:   PT RN       Increase activity slowly    Complete by:  As directed           Discharge Medication List as of 11/29/2013  5:13 PM    START taking these medications   Details  pantoprazole (PROTONIX) 40 MG tablet Take 1 tablet (40 mg total) by mouth daily., Starting  11/29/2013, Until Discontinued, Print    polyethylene glycol (MIRALAX / GLYCOLAX) packet Take 17 g by mouth daily., Starting 11/29/2013, Until Discontinued, Print      CONTINUE these medications which have NOT CHANGED   Details  acetaminophen (TYLENOL) 325 MG tablet Take 2 tablets (650 mg total) by mouth 3 (three) times daily as needed for pain., Starting 12/08/2012, Until Discontinued, No Print    aspirin 81 MG tablet Take 81 mg by mouth daily., Until Discontinued, Historical Med    atorvastatin (LIPITOR) 80 MG tablet Take 1 tablet (80 mg total) by mouth daily at 6 PM., Starting 07/11/2013, Until Discontinued, Normal    citalopram (CELEXA) 20 MG tablet TAKE 1/2 TABLET BY MOUTH EVERY DAY, Normal    colesevelam (WELCHOL) 625 MG tablet Take 1,250 mg by mouth 2 (two) times daily with a meal., Until Discontinued, Historical Med    Multiple Vitamin (MULTIVITAMIN WITH MINERALS) TABS Take 1 tablet by mouth daily., Until Discontinued, Historical Med    nitroGLYCERIN (NITROSTAT) 0.4 MG SL tablet Place 0.4 mg under the tongue every 5 (five) minutes as needed for chest pain., Until Discontinued, Historical Med  thiamine 100 MG tablet Take 1 tablet (100 mg total) by mouth daily. B. vitamin., Starting 12/08/2012, Until Discontinued, No Print      STOP taking these medications     amLODipine (NORVASC) 5 MG tablet      BRILINTA 90 MG TABS tablet      carvedilol (COREG) 6.25 MG tablet        No Known Allergies Follow-up Information   Follow up with [facility]. (they will call you in 1-2 days)    Contact information:   [address] [place] [address] [phone]        The results of significant diagnostics from this hospitalization (including imaging, microbiology, ancillary and laboratory) are listed below for reference.    Significant Diagnostic Studies: US Abdomen Complete  11/26/2013   CLINICAL DATA:  Rule out pancreatitis and gallstones. History of  aortic stent graft. High cholesterol, alcohol and tobacco abuse.  EXAM: ULTRASOUND ABDOMEN COMPLETE  COMPARISON:  CT of the abdomen on 04/22/2013  FINDINGS: Gallbladder:  Multiple gallstones are identified. These are mobile during exam and measure on the order of 1.1 x 0.8 x 0.3 cm. Gallbladder wall measures 2.5 mm. No sonographic Murphy sign.  Common bile duct:  Diameter: 3.7 mm  Liver:  Minimally nodular.  No focal abnormality.  IVC:  No abnormality visualized.  Pancreas:  Limited visibility because of overlying bowel gas.  Spleen:  4.6 cm.  Normal in echogenicity.  Right Kidney:  Length: 6.9 cm. Increased echogenicity of renal parenchyma. No focal mass or hydronephrosis.  Left Kidney:  Length: 10.8 cm. Increased echogenicity of renal parenchyma. No focal mass or hydronephrosis.  Abdominal aorta:  3.6 cm proximal abdominal aortic aneurysm. The patient has an aortic stent graft.  Other findings:  None  IMPRESSION: 1. Multiple gallstones, measuring up to 1.1 cm. No other evidence for acute cholecystitis. 2. Small right kidney. 3. No hydronephrosis.  Echogenic renal parenchyma bilaterally. 4. Proximal abdominal aortic aneurysm.  Aortic stent graft.   Electronically Signed   By: Shon Hale M.D.   On: 11/26/2013 10:24   Dg Abd Acute W/chest  11/27/2013   CLINICAL DATA:  Evaluate high stool burden  EXAM: ACUTE ABDOMEN SERIES (ABDOMEN 2 VIEW & CHEST 1 VIEW)  COMPARISON:  11/25/2013  FINDINGS: Heart size and vascular pattern are normal. Lungs are clear. No free air.  Aortic stent again identified. There is stool throughout the descending and sigmoid colon and into the rectum. Small amount of stool is seen in the ascending colon. When compared to the prior study, there is significantly less stool in the rectum.  IMPRESSION: There remains fecal retention but rectal impaction has resolved or significantly improved.   Electronically Signed   By: Skipper Cliche M.D.   On: 11/27/2013 09:17   Dg Abd Acute  W/chest  11/25/2013   CLINICAL DATA:  Diarrhea  EXAM: ACUTE ABDOMEN SERIES (ABDOMEN 2 VIEW & CHEST 1 VIEW)  COMPARISON:  None.  FINDINGS: Normal heart size. Lungs hyper aerated and grossly clear. Aorto bi-iliac stent graft is in place. No disproportionate dilatation of bowel. No free intraperitoneal gas. Severe stool burden in the rectum. There is also stool throughout the transverse and descending colon.  IMPRESSION: Rectal fecal impaction.   Electronically Signed   By: Maryclare Bean M.D.   On: 11/25/2013 13:43    Microbiology: Recent Results (from the past 240 hour(s))  URINE CULTURE     Status: None   Collection Time  11/25/13 12:17 PM      Result Value Ref Range Status   Specimen Description URINE, CATHETERIZED   Final   Special Requests NONE   Final   Culture  Setup Time     Final   Value: 11/25/2013 23:22     Performed at [place] Count     Final   Value: NO GROWTH     Performed at [facility]   Culture     Final   Value: NO GROWTH     Performed at [facility]   Report Status 11/27/2013 FINAL   Final  CLOSTRIDIUM DIFFICILE BY PCR     Status: None   Collection Time    11/25/13  9:44 PM      Result Value Ref Range Status   C difficile by pcr NEGATIVE  NEGATIVE Final     Labs: Basic Metabolic Panel:  Recent Labs Lab 11/26/13 0524 11/26/13 1201 11/27/13 0531 11/28/13 0601 11/29/13 0541  NA 143 141 144 145 145  K 5.0 4.5 4.7 4.8 4.4  CL 110 108 112 112 111  CO2 24 24 23 24 23   GLUCOSE 84 138* 81 85 85  BUN 24* 23 20 18 17   CREATININE 2.45* 2.29* 2.15* 2.10* 2.21*  CALCIUM 8.7 8.8 8.5 8.5 8.7   Liver Function Tests:  Recent Labs Lab 11/25/13 1144 11/26/13 0524  AST 24 20  ALT 14 11  ALKPHOS 120* 100  BILITOT 0.4 0.5  PROT 7.5 6.4  ALBUMIN 3.2* 2.7*    Recent Labs Lab 11/25/13 1144 11/26/13 0524  LIPASE 78* 135*   No results found for this basename: AMMONIA,  in the last 168 hours CBC:  Recent Labs Lab  11/25/13 1144 11/26/13 0524 11/28/13 1837 11/29/13 0541  WBC 9.6 8.0 5.8 6.2  NEUTROABS 6.9  --   --   --   HGB 12.5* 10.9* 9.9* 10.5*  HCT 35.8* 32.1* 28.9* 30.5*  MCV 91.6 92.8 91.5 91.9  PLT 213 193 159 165   Cardiac Enzymes:  Recent Labs Lab 11/25/13 1144  TROPONINI <0.30   BNP: BNP (last 3 results)  Recent Labs  12/06/12 0456  PROBNP 898.6*   CBG:  Recent Labs Lab 11/26/13 0733 11/27/13 0731 11/28/13 0730 11/29/13 0726  GLUCAP 77 78 81 79       Signed:  Mehki Klumpp  [facility] 11/29/2013, 7:54 PM

## 2013-11-29 NOTE — Progress Notes (Signed)
Contacted by Dr Roderic Palau regarding DAPT for this patient. From records BMS placed to LAD in 09/2011, has been on DAPT since that time. He has completed over 1 year of therapy, there is no indication for continued brillinta, ok to stop. Continue ASA indefinitely.   Zandra Abts MD

## 2013-11-29 NOTE — Progress Notes (Signed)
Patient discharged home with wife.  IV removed - WNL.  Instructed on new medications and which ones to stop.  Encouraged to stop smoking - education given.  HH in place per CM.  No questions at this time.  Patient stable to DC home - left floor via WC with assist of NT.

## 2013-12-02 ENCOUNTER — Telehealth: Payer: Self-pay | Admitting: Family Medicine

## 2013-12-02 NOTE — Care Management Note (Signed)
    Page 1 of 1   12/02/2013     4:26:08 PM CARE MANAGEMENT NOTE 12/02/2013  Patient:  [name], [name]   Account Number:  [id number]  Date Initiated:  11/26/2013  Documentation initiated by:  Vladimir Creeks  Subjective/Objective Assessment:   Admitted with diarrhea/ impaction, CKD, orthostasis. He is from home and will be returning home with spouse     Action/Plan:   Pt has [facility] active in the home, and would like to continue with them   Anticipated DC Date:  11/29/2013   Anticipated DC Plan:  [place]  CM consult      [facility] Choice  HOME HEALTH  Resumption Of Svcs/PTA Provider   Choice offered to / List presented to:  C-1 Patient        [place] arranged  HH-1 RN  [place].   Status of service:  Completed, signed off Medicare Important Message given?  YES (If response is "NO", the following Medicare IM given date fields will be blank) Date Medicare IM given:  11/29/2013 Medicare IM given by:  Vladimir Creeks Date Additional Medicare IM given:   Additional Medicare IM given by:    Discharge Disposition:  [place]  Per UR Regulation:  Reviewed for med. necessity/level of care/duration of stay  If discussed at [place] of Stay Meetings, dates discussed:    Comments:  11/29/13 1300 Vladimir Creeks RN/CM

## 2013-12-02 NOTE — Telephone Encounter (Signed)
Calling to let Dr. Wolfgang Phoenix know that they have resumed care for patient following a recent hospitalization. They resumed care on 11/30/13.  He is receiving PT for 1 time a week for 1 week, and 2 times a week for 5 weeks.

## 2013-12-05 DIAGNOSIS — IMO0001 Reserved for inherently not codable concepts without codable children: Secondary | ICD-10-CM

## 2013-12-05 DIAGNOSIS — N189 Chronic kidney disease, unspecified: Secondary | ICD-10-CM

## 2013-12-05 DIAGNOSIS — I2510 Atherosclerotic heart disease of native coronary artery without angina pectoris: Secondary | ICD-10-CM

## 2013-12-05 DIAGNOSIS — R279 Unspecified lack of coordination: Secondary | ICD-10-CM

## 2013-12-05 DIAGNOSIS — Z9181 History of falling: Secondary | ICD-10-CM

## 2013-12-17 ENCOUNTER — Telehealth: Payer: Self-pay | Admitting: Cardiovascular Disease

## 2013-12-17 NOTE — Telephone Encounter (Signed)
HH checked patient's BP and it was 200/100 at rest.  States that patient is not on any BP meds. Please call Raquel Sarna back. / tgs

## 2013-12-17 NOTE — Telephone Encounter (Signed)
According to office note of 07/2013, he should be on carvedilol 6.25 mg BID and amlodipine 5 mg daily. I do not see this on his medication list currently. He will need to be back on these medications. Maybe he should come for appt? Did he refill his medications?

## 2013-12-17 NOTE — Telephone Encounter (Signed)
Will forward to K.Lawrence NP for dispo

## 2013-12-17 NOTE — Telephone Encounter (Signed)
Per 11/29/13  Hospital discharge instructions,pt was taken off coreg and amlodipine due to to orthostatic hypotension and bradycardia.Patient was to have 2 week fu with Cardiology.Apt made in [place] office for Thursday Oct 1, at 1:40 pm with Dr.Koneswaran

## 2013-12-19 ENCOUNTER — Encounter: Payer: Self-pay | Admitting: Cardiovascular Disease

## 2013-12-19 ENCOUNTER — Ambulatory Visit (INDEPENDENT_AMBULATORY_CARE_PROVIDER_SITE_OTHER): Payer: Medicare Other | Admitting: Cardiovascular Disease

## 2013-12-19 VITALS — BP 206/92 | HR 60 | Ht 75.0 in | Wt 168.0 lb

## 2013-12-19 DIAGNOSIS — Z87898 Personal history of other specified conditions: Secondary | ICD-10-CM

## 2013-12-19 DIAGNOSIS — I5022 Chronic systolic (congestive) heart failure: Secondary | ICD-10-CM

## 2013-12-19 DIAGNOSIS — I2510 Atherosclerotic heart disease of native coronary artery without angina pectoris: Secondary | ICD-10-CM

## 2013-12-19 DIAGNOSIS — Z9289 Personal history of other medical treatment: Secondary | ICD-10-CM

## 2013-12-19 DIAGNOSIS — E785 Hyperlipidemia, unspecified: Secondary | ICD-10-CM

## 2013-12-19 DIAGNOSIS — I119 Hypertensive heart disease without heart failure: Secondary | ICD-10-CM

## 2013-12-19 DIAGNOSIS — I1310 Hypertensive heart and chronic kidney disease without heart failure, with stage 1 through stage 4 chronic kidney disease, or unspecified chronic kidney disease: Secondary | ICD-10-CM

## 2013-12-19 DIAGNOSIS — N183 Chronic kidney disease, stage 3 unspecified: Secondary | ICD-10-CM

## 2013-12-19 MED ORDER — AMLODIPINE BESYLATE 5 MG PO TABS: 5.0000 mg | ORAL_TABLET | Freq: Every day | ORAL | Status: DC

## 2013-12-19 NOTE — Patient Instructions (Signed)
   Begin Norvasc 5mg  daily Continue all other medications.   Your physician has requested that you regularly monitor and record your blood pressure readings at home. Please take at varied times of the day x 1 week & call office with readings.   Follow up in  4-6 weeks

## 2013-12-19 NOTE — Progress Notes (Signed)
Patient ID: [name], male   DOB: [birth year], 68 y.o.   MRN: [id number]      SUBJECTIVE: The patient is a 68 year old male who I am evaluating for the first time. He has a history of coronary artery disease with a bare-metal stent placement to the LAD in July 2013. He had moderate residual RCA disease of the mid vessel 50-60% lesion at that time. He also has a history of hypertension, hyperlipidemia and CVA. He was recently hospitalized for fecal impaction with overflow incontinence. He was noted to be bradycardic and his beta blocker was stopped. Brilinta was also stopped. He had heme positive stools with no frank melena or hematochezia. He was noted to have gallstones as well. TSH was normal. Echocardiogram in July 2013 demonstrated moderately reduced left ventricular systolic function, EF  20-94%, mild LV enlargement, with severe hypokinesis of the mid to distal anteroseptal wall. BUN 17 and creatinine 2.21 on 11/29/2013. Lipid panel on 05/20/2013 demonstrated total cholesterol 162, triglycerides 80, HDL 49, LDL 97.  He denies chest pain, orthopnea, leg swelling, shortness of breath, headaches and paroxysmal nocturnal dyspnea. He said, "I feel woozy and it's probably because my blood pressure is high". Denies nausea and vomiting.  Review of Systems: As per "subjective", otherwise negative.  No Known Allergies  Current Outpatient Prescriptions  Medication Sig Dispense Refill  . acetaminophen (TYLENOL) 325 MG tablet Take 2 tablets (650 mg total) by mouth 3 (three) times daily as needed for pain.      Marland Kitchen aspirin 81 MG tablet Take 81 mg by mouth daily.      Marland Kitchen atorvastatin (LIPITOR) 80 MG tablet Take 1 tablet (80 mg total) by mouth daily at 6 PM.  90 tablet  3  . citalopram (CELEXA) 20 MG tablet TAKE 1/2 TABLET BY MOUTH EVERY DAY  15 tablet  5  . colesevelam (WELCHOL) 625 MG tablet Take 1,250 mg by mouth 2 (two) times daily with a meal.      . Multiple Vitamin (MULTIVITAMIN WITH MINERALS)  TABS Take 1 tablet by mouth daily.      . nitroGLYCERIN (NITROSTAT) 0.4 MG SL tablet Place 0.4 mg under the tongue every 5 (five) minutes as needed for chest pain.      . pantoprazole (PROTONIX) 40 MG tablet Take 1 tablet (40 mg total) by mouth daily.  30 tablet  1  . polyethylene glycol (MIRALAX / GLYCOLAX) packet Take 17 g by mouth daily.  14 each  0  . thiamine 100 MG tablet Take 1 tablet (100 mg total) by mouth daily. B. vitamin.      . [DISCONTINUED] cloNIDine (CATAPRES) 0.2 MG tablet Take 0.2 mg by mouth 2 (two) times daily.        . [DISCONTINUED] metoprolol (TOPROL-XL) 50 MG 24 hr tablet Take 50 mg by mouth daily.        . [DISCONTINUED] niacin (NIASPAN) 500 MG CR tablet Take 500 mg by mouth at bedtime.        . [DISCONTINUED] omeprazole (PRILOSEC) 20 MG capsule 1 po every morning  30 capsule  5   No current facility-administered medications for this visit.    Past Medical History  Diagnosis Date  . Hypertension   . COPD (chronic obstructive pulmonary disease)   . Hyperlipidemia   . Cardiomyopathy     EF of 30% per echo in June of 2012; admitted with congestive heart failure; nonobstructive CAD on cath in 08/2010  . History of noncompliance with medical  treatment   . Cerebrovascular disease 2011    CVA  in 02/2010-only deficit is decreased vision in  right eye  . Alcohol abuse   . Tobacco abuse     40 pack years  . Abdominal aortic aneurysm     Stent graft repair  . Stroke 2011    decreased vision of right eye  . Chronic systolic CHF (congestive heart failure)   . Leg pain   . CKD (chronic kidney disease) stage 3, GFR 30-59 ml/min     creatinine-1.7 in 08/2010  . CAD (coronary artery disease)   . Myocardial infarction acute 10/01/11  . CKD (chronic kidney disease), stage III 12/06/2012  . Effusion of right knee joint 12/06/2012  . Anemia   . Atrial fibrillation   . Ataxic gait   . Weakness generalized   . Frequent falls     Past Surgical History  Procedure Laterality  Date  . Colonoscopy w/ polypectomy  2006    Dr. Tamala Julian: anal fissure, sessile adenomatous polyp, diverticulosis  . Esophagogastroduodenoscopy  11/15/2010    Procedure: ESOPHAGOGASTRODUODENOSCOPY (EGD);  Surgeon: Dorothyann Peng, MD;  Location: [facility];  Service: Endoscopy;  Laterality: N/A;  with propofol sedation; procedure start @ 0813  . Flexible sigmoidoscopy  11/15/2010    Procedure: FLEXIBLE SIGMOIDOSCOPY;  Surgeon: Dorothyann Peng, MD;  Location: [facility];  Service: Endoscopy;  Laterality: N/A;  with propofol sedation; ended @ 0808  . Polypectomy  12/13/2010    Procedure: POLYPECTOMY;  Surgeon: Dorothyann Peng, MD;  Location: [facility];  Service: Endoscopy;  Laterality: N/A;  right colon, cecal, transverse, and sigmoid polypectomy  . Cardiac catheterization  10/04/11    Normal left main, 95% pLAD stenosis with ulceration s/p successful BMS placement, 30% segmental plaque beyond this, dLAD after D2 with 50% plaquing, then focal 70% lesion, 80% focal short D2 stenosis, 30% pOM2 lesion, 30-40% mOM3 stenosis, Shephard's crook region of RCA with 50% lesion, mRCA 50-60% lesion and mild dRCA irregularities.   . Coronary stent placement  10/04/11  . Abdominal aortic aneurysm repair w/ endoluminal graft      01/16/2008    History   Social History  . Marital Status: Married    Spouse Name: N/A    Number of Children: N/A  . Years of Education: N/A   Occupational History  . Not on file.   Social History Main Topics  . Smoking status: Current Every Day Smoker -- 0.50 packs/day for 50 years    Types: Cigarettes    Start date: 06/25/1957  . Smokeless tobacco: Never Used  . Alcohol Use: 16.8 oz/week    28 Shots of liquor per week     Comment: quit about 1 month ago, prior to this would drink about 7 oz of liquor per day 10/2012  . Drug Use: No  . Sexual Activity: No   Other Topics Concern  . Not on file   Social History Narrative  . No narrative on file     Filed Vitals:   12/19/13 1321  BP:  206/92  Pulse: 60  Height: 6\' 3"  (1.905 m)  Weight: 168 lb (76.204 kg)    PHYSICAL EXAM General: NAD HEENT: Normal. Neck: No JVD, no thyromegaly. Lungs: Clear to auscultation bilaterally with normal respiratory effort. CV: Nondisplaced PMI.  Regular rate and rhythm, normal S1/S2, no S3/S4, no murmur. No pretibial or periankle edema.  No carotid bruit.  Normal pedal pulses.  Abdomen: Soft, nontender, no hepatosplenomegaly, no distention.  Neurologic: Alert and oriented x 3.  Psych: Normal affect. Skin: Normal. Musculoskeletal: Normal range of motion, no gross deformities. Extremities: No clubbing or cyanosis.   ECG: Most recent ECG reviewed.      ASSESSMENT AND PLAN: 1. CAD: Stable ischemic heart disease. Continue aspirin and Lipitor. Had been bradycardic and thus beta blocker was stopped. Resting heart rate is 60 beats per minute without AV nodal blocking agents today. 2. Malignant HTN: Will start amlodipine 5 mg daily. I recommend a nurse blood pressure check in one week. I have also asked him to purchase a blood pressure cuff and to begin recording daily blood pressures to see if further antihypertensive titration is warranted. 3. Chronic systolic heart failure: Euvolemic and stable. Not on diuretics at present. 4. Hyperlipidemia: Lipid results noted above. He may be a candidate for PCSK-9 inhibitors. Will repeat lipids at next visit. 5. CKD stage 3: GFR 33 on 9/11.  Dispo: f/u 4-6 weeks.  Kate Sable, M.D., F.A.C.C.

## 2013-12-30 ENCOUNTER — Encounter: Payer: Self-pay | Admitting: Gastroenterology

## 2013-12-30 ENCOUNTER — Telehealth: Payer: Self-pay | Admitting: Gastroenterology

## 2013-12-30 NOTE — Telephone Encounter (Signed)
APPT MADE AND LETTER SENT  °

## 2013-12-30 NOTE — Telephone Encounter (Signed)
PT NEED OPV E30 SLF ANEMIA/HEME POS STOOLS WITHIN THE NEXT 4-8 WEEKS.

## 2013-12-30 NOTE — Telephone Encounter (Addendum)
LAST TCS/EGD AUG 2012 FOR ANEMIA(HB 13.2) WHILE PT WAS ON AGGRENOX. ------      Message from: Kathie Dike      Created: Fri Nov 29, 2013  8:05 PM    PT has heme positive stools with normal hemoglobin. He reports having a colonoscopy with you in the past. He is continued on aspirin for coronary artery disease. He does not have any gross evidence of bleeding. If you wouldn't mind scheduling him to be seen in the office to further evaluate source of heme positive stools. I started him on protonix      ------

## 2014-01-16 ENCOUNTER — Encounter: Payer: Self-pay | Admitting: Cardiovascular Disease

## 2014-01-16 ENCOUNTER — Ambulatory Visit (INDEPENDENT_AMBULATORY_CARE_PROVIDER_SITE_OTHER): Payer: Medicare Other | Admitting: Cardiovascular Disease

## 2014-01-16 VITALS — BP 178/82 | HR 78 | Ht 75.0 in | Wt 133.0 lb

## 2014-01-16 DIAGNOSIS — I1310 Hypertensive heart and chronic kidney disease without heart failure, with stage 1 through stage 4 chronic kidney disease, or unspecified chronic kidney disease: Secondary | ICD-10-CM

## 2014-01-16 DIAGNOSIS — N183 Chronic kidney disease, stage 3 unspecified: Secondary | ICD-10-CM

## 2014-01-16 DIAGNOSIS — I2510 Atherosclerotic heart disease of native coronary artery without angina pectoris: Secondary | ICD-10-CM

## 2014-01-16 DIAGNOSIS — E785 Hyperlipidemia, unspecified: Secondary | ICD-10-CM

## 2014-01-16 DIAGNOSIS — I5022 Chronic systolic (congestive) heart failure: Secondary | ICD-10-CM

## 2014-01-16 DIAGNOSIS — I119 Hypertensive heart disease without heart failure: Secondary | ICD-10-CM

## 2014-01-16 MED ORDER — AMLODIPINE BESYLATE 10 MG PO TABS: 10.0000 mg | ORAL_TABLET | Freq: Every day | ORAL | Status: DC

## 2014-01-16 NOTE — Addendum Note (Signed)
Addended by: Laurine Blazer on: 01/16/2014 02:43 PM   Modules accepted: Orders

## 2014-01-16 NOTE — Progress Notes (Signed)
Patient ID: [name], male   DOB: 1945-05-27, 68 y.o.   MRN: [id number]      SUBJECTIVE: Since his last visit with me, he said he feels no better. He has been having some visual disturbances. He denies headaches, chest pain, shortness of breath, and falls. He has been checking his blood pressure at home and says that systolic readings are routinely in the 160-170 range.    Review of Systems: As per "subjective", otherwise negative.  No Known Allergies  Current Outpatient Prescriptions  Medication Sig Dispense Refill  . amLODipine (NORVASC) 5 MG tablet Take 1 tablet (5 mg total) by mouth daily.  30 tablet  6  . aspirin 81 MG tablet Take 81 mg by mouth daily.      Marland Kitchen atorvastatin (LIPITOR) 80 MG tablet Take 1 tablet (80 mg total) by mouth daily at 6 PM.  90 tablet  3  . citalopram (CELEXA) 20 MG tablet TAKE 1/2 TABLET BY MOUTH EVERY DAY  15 tablet  5  . colesevelam (WELCHOL) 625 MG tablet Take 1,250 mg by mouth 2 (two) times daily with a meal.      . Multiple Vitamin (MULTIVITAMIN WITH MINERALS) TABS Take 1 tablet by mouth daily.      . nitroGLYCERIN (NITROSTAT) 0.4 MG SL tablet Place 0.4 mg under the tongue every 5 (five) minutes as needed for chest pain.      . pantoprazole (PROTONIX) 40 MG tablet Take 1 tablet (40 mg total) by mouth daily.  30 tablet  1  . polyethylene glycol (MIRALAX / GLYCOLAX) packet Take 17 g by mouth daily.  14 each  0  . thiamine 100 MG tablet Take 1 tablet (100 mg total) by mouth daily. B. vitamin.      Marland Kitchen acetaminophen (TYLENOL) 325 MG tablet Take 2 tablets (650 mg total) by mouth 3 (three) times daily as needed for pain.      . [DISCONTINUED] cloNIDine (CATAPRES) 0.2 MG tablet Take 0.2 mg by mouth 2 (two) times daily.        . [DISCONTINUED] metoprolol (TOPROL-XL) 50 MG 24 hr tablet Take 50 mg by mouth daily.        . [DISCONTINUED] niacin (NIASPAN) 500 MG CR tablet Take 500 mg by mouth at bedtime.        . [DISCONTINUED] omeprazole (PRILOSEC) 20 MG capsule  1 po every morning  30 capsule  5   No current facility-administered medications for this visit.    Past Medical History  Diagnosis Date  . Hypertension   . COPD (chronic obstructive pulmonary disease)   . Hyperlipidemia   . Cardiomyopathy     EF of 30% per echo in June of 2012; admitted with congestive heart failure; nonobstructive CAD on cath in 08/2010  . History of noncompliance with medical treatment   . Cerebrovascular disease 2011    CVA  in 02/2010-only deficit is decreased vision in  right eye  . Alcohol abuse   . Tobacco abuse     40 pack years  . Abdominal aortic aneurysm     Stent graft repair  . Stroke 2011    decreased vision of right eye  . Chronic systolic CHF (congestive heart failure)   . Leg pain   . CKD (chronic kidney disease) stage 3, GFR 30-59 ml/min     creatinine-1.7 in 08/2010  . CAD (coronary artery disease)   . Myocardial infarction acute 10/01/11  . CKD (chronic kidney disease), stage III 12/06/2012  .  Effusion of right knee joint 12/06/2012  . Anemia   . Atrial fibrillation   . Ataxic gait   . Weakness generalized   . Frequent falls     Past Surgical History  Procedure Laterality Date  . Colonoscopy w/ polypectomy  2006    Dr. Tamala Julian: anal fissure, sessile adenomatous polyp, diverticulosis  . Esophagogastroduodenoscopy  11/15/2010    Procedure: ESOPHAGOGASTRODUODENOSCOPY (EGD);  Surgeon: Dorothyann Peng, MD;  Location: [facility];  Service: Endoscopy;  Laterality: N/A;  with propofol sedation; procedure start @ 0813  . Flexible sigmoidoscopy  11/15/2010    Procedure: FLEXIBLE SIGMOIDOSCOPY;  Surgeon: Dorothyann Peng, MD;  Location: [facility];  Service: Endoscopy;  Laterality: N/A;  with propofol sedation; ended @ 0808  . Polypectomy  12/13/2010    Procedure: POLYPECTOMY;  Surgeon: Dorothyann Peng, MD;  Location: [facility];  Service: Endoscopy;  Laterality: N/A;  right colon, cecal, transverse, and sigmoid polypectomy  . Cardiac catheterization  10/04/11     Normal left main, 95% pLAD stenosis with ulceration s/p successful BMS placement, 30% segmental plaque beyond this, dLAD after D2 with 50% plaquing, then focal 70% lesion, 80% focal short D2 stenosis, 30% pOM2 lesion, 30-40% mOM3 stenosis, Shephard's crook region of RCA with 50% lesion, mRCA 50-60% lesion and mild dRCA irregularities.   . Coronary stent placement  10/04/11  . Abdominal aortic aneurysm repair w/ endoluminal graft      01/16/2008    History   Social History  . Marital Status: Married    Spouse Name: N/A    Number of Children: N/A  . Years of Education: N/A   Occupational History  . Not on file.   Social History Main Topics  . Smoking status: Current Some Day Smoker -- 0.50 packs/day for 50 years    Types: Cigarettes    Start date: 06/25/1957  . Smokeless tobacco: Never Used  . Alcohol Use: 16.8 oz/week    28 Shots of liquor per week     Comment: quit about 1 month ago, prior to this would drink about 7 oz of liquor per day 10/2012  . Drug Use: No  . Sexual Activity: No   Other Topics Concern  . Not on file   Social History Narrative  . No narrative on file     Filed Vitals:   01/16/14 1416  BP: 178/82  Pulse: 78  Height: 6\' 3"  (1.905 m)  Weight: 133 lb (60.328 kg)    PHYSICAL EXAM General: NAD HEENT: Normal. Neck: No JVD, no thyromegaly. Lungs: Clear to auscultation bilaterally with normal respiratory effort. CV: Nondisplaced PMI.  Regular rate and rhythm, normal S1/S2, no S3/S4, no murmur. No pretibial or periankle edema.  No carotid bruit.  Normal pedal pulses.  Abdomen: Soft, nontender, no hepatosplenomegaly, no distention.  Neurologic: Alert and oriented x 3.  Psych: Normal affect. Skin: Normal. Musculoskeletal: Normal range of motion, no gross deformities. Extremities: No clubbing or cyanosis.   ECG: Most recent ECG reviewed.      ASSESSMENT AND PLAN: 1. CAD: Stable ischemic heart disease. Continue aspirin and Lipitor. Had been  bradycardic and thus beta blocker was stopped. Resting heart rate is 78 beats per minute without AV nodal blocking agents today.  2. Malignant HTN: Remains elevated on amlodipine 5 mg, which I will increase to 10 mg daily. I have asked the patient to check blood pressure readings 4-5 times per week, at different times throughout the day, in order to get a better  approximation of mean BP values. These results will be provided to me at the end of that period so that I can determine if antihypertensive medication titration is indicated. 3. Chronic systolic heart failure: Euvolemic and stable. Not on diuretics at present.  4. Hyperlipidemia: Lipid results previously reviewed. Will repeat lipids. 5. CKD stage 3: GFR 33 on 9/11.   Dispo: f/u 3 months.  Kate Sable, M.D., F.A.C.C.

## 2014-01-16 NOTE — Patient Instructions (Signed)
   Increase Amlodipine to 10mg  daily - new sent to pharm Continue all other medications.   Lab for lipids - Reminder:  Nothing to eat or drink after 12 midnight prior to labs.  Office will contact with results via phone or letter.   Your physician has requested that you regularly monitor and record your blood pressure readings at home. Please check at different times of the day 3-4 x per week for 1 month.  Please return log to office for MD review.   Follow up in  3 months

## 2014-01-20 ENCOUNTER — Ambulatory Visit (INDEPENDENT_AMBULATORY_CARE_PROVIDER_SITE_OTHER): Payer: Medicare Other | Admitting: Family Medicine

## 2014-01-20 ENCOUNTER — Encounter: Payer: Medicare Other | Admitting: Cardiovascular Disease

## 2014-01-20 ENCOUNTER — Ambulatory Visit (INDEPENDENT_AMBULATORY_CARE_PROVIDER_SITE_OTHER): Payer: Medicare Other | Admitting: *Deleted

## 2014-01-20 ENCOUNTER — Encounter: Payer: Self-pay | Admitting: Family Medicine

## 2014-01-20 VITALS — BP 154/90 | Ht 75.0 in | Wt 166.0 lb

## 2014-01-20 DIAGNOSIS — Z23 Encounter for immunization: Secondary | ICD-10-CM

## 2014-01-20 DIAGNOSIS — K625 Hemorrhage of anus and rectum: Secondary | ICD-10-CM

## 2014-01-20 DIAGNOSIS — E785 Hyperlipidemia, unspecified: Secondary | ICD-10-CM

## 2014-01-20 DIAGNOSIS — N183 Chronic kidney disease, stage 3 unspecified: Secondary | ICD-10-CM

## 2014-01-20 DIAGNOSIS — F0150 Vascular dementia without behavioral disturbance: Secondary | ICD-10-CM

## 2014-01-20 NOTE — Progress Notes (Signed)
   Subjective:    Patient ID: [name], male    DOB: February 06, 1946, 68 y.o.   MRN: [id number]  Hyperlipidemia This is a chronic problem. The current episode started more than 1 year ago. Pertinent negatives include no chest pain. Compliance problems include adherence to exercise.   Requesting flu vaccine.  Blood pressure is better but still running high. Pt brought in readings.   Patient does try take his medicine as directed he does drink plenty of liquids denies excessive thirst Denies any rectal bleeding but he did have problems with this in the hospital they did not do a new colonoscopy they'll be seeing him in December He does have dementia been stable his wife helps him male He also has history of hypertension they restarted him on medications. Blood pressures are gradually coming down   Review of Systems  Constitutional: Negative for activity change, appetite change and fatigue.  HENT: Negative for congestion.   Respiratory: Negative for cough.   Cardiovascular: Negative for chest pain.  Gastrointestinal: Negative for abdominal pain.  Endocrine: Negative for polydipsia and polyphagia.  Neurological: Negative for weakness.  Psychiatric/Behavioral: Negative for confusion.       Objective:   Physical Exam  Constitutional: He appears well-nourished. No distress.  Cardiovascular: Normal rate, regular rhythm and normal heart sounds.   No murmur heard. Pulmonary/Chest: Effort normal and breath sounds normal. No respiratory distress.  Musculoskeletal: He exhibits no edema.  Lymphadenopathy:    He has no cervical adenopathy.  Neurological: He is alert.  Psychiatric: His behavior is normal.  Vitals reviewed.         Assessment & Plan:  HTN send [place] some readings in a few weeks may need additional medications Hyperlipidemia he needs a check lab work await result Chronic kidney disease keep blood pressure and check avoid excessive proteins check metabolic 7 Dementia is  stable Rectal bleeding and the bleeding resolved check Hemoccult cards 3

## 2014-01-21 LAB — LIPID PANEL
CHOL/HDL RATIO: 3.7 ratio
CHOLESTEROL: 191 mg/dL (ref 0–200)
HDL: 52 mg/dL (ref >39)
LDL Cholesterol: 126 mg/dL — ABNORMAL HIGH (ref 0–99)
Triglycerides: 66 mg/dL (ref <150)
VLDL: 13 mg/dL (ref 0–40)

## 2014-01-21 LAB — HEPATIC FUNCTION PANEL
ALBUMIN: 3.5 g/dL (ref 3.5–5.2)
ALT: 12 U/L (ref 0–53)
AST: 19 U/L (ref 0–37)
Alkaline Phosphatase: 125 U/L — ABNORMAL HIGH (ref 39–117)
BILIRUBIN TOTAL: 0.4 mg/dL (ref 0.2–1.2)
Bilirubin, Direct: 0.1 mg/dL (ref 0.0–0.3)
Indirect Bilirubin: 0.3 mg/dL (ref 0.2–1.2)
Total Protein: 7.2 g/dL (ref 6.0–8.3)

## 2014-01-21 LAB — BASIC METABOLIC PANEL
BUN: 34 mg/dL — ABNORMAL HIGH (ref 6–23)
CO2: 22 mEq/L (ref 19–32)
Calcium: 9 mg/dL (ref 8.4–10.5)
Chloride: 104 mEq/L (ref 96–112)
Creat: 2.95 mg/dL — ABNORMAL HIGH (ref 0.50–1.35)
Glucose, Bld: 77 mg/dL (ref 70–99)
Potassium: 5 mEq/L (ref 3.5–5.3)
SODIUM: 141 meq/L (ref 135–145)

## 2014-01-28 ENCOUNTER — Other Ambulatory Visit: Payer: Self-pay | Admitting: *Deleted

## 2014-01-28 DIAGNOSIS — K625 Hemorrhage of anus and rectum: Secondary | ICD-10-CM

## 2014-01-28 LAB — POC HEMOCCULT BLD/STL (HOME/3-CARD/SCREEN)
FECAL OCCULT BLD: NEGATIVE
FECAL OCCULT BLD: NEGATIVE
Fecal Occult Blood, POC: NEGATIVE

## 2014-01-29 NOTE — Progress Notes (Signed)
Card sent 

## 2014-02-19 ENCOUNTER — Ambulatory Visit (INDEPENDENT_AMBULATORY_CARE_PROVIDER_SITE_OTHER): Payer: Medicare Other | Admitting: Gastroenterology

## 2014-02-19 ENCOUNTER — Encounter: Payer: Self-pay | Admitting: Gastroenterology

## 2014-02-19 VITALS — BP 158/89 | HR 76 | Temp 98.4°F | Ht 75.0 in | Wt 168.6 lb

## 2014-02-19 DIAGNOSIS — D638 Anemia in other chronic diseases classified elsewhere: Secondary | ICD-10-CM

## 2014-02-19 MED ORDER — PANTOPRAZOLE SODIUM 40 MG PO TBEC: 40.0000 mg | DELAYED_RELEASE_TABLET | Freq: Every day | ORAL | Status: DC

## 2014-02-19 NOTE — Progress Notes (Signed)
ON RECALL LIST  °

## 2014-02-19 NOTE — Patient Instructions (Signed)
PLEASE COMPLETE YOUR LAB DRAW TO CHECK YOUR BLOOD COUNT.  PLEASE CALL WITH QUESTIONS OR CONCERNS IF YOU SEE BLACK TARRY STOOLS OR PERSISTENT RECTAL BLEEDING.  I WILL CONTACT YOU WITH YOUR RESULTS WITHIN 14 DAYS OR YOU CAN LOOK THEM UP ON MY CHART AFTER 48 HRS.  FOLLOW UP IN 6 MOS.

## 2014-02-19 NOTE — Assessment & Plan Note (Signed)
NO SIGNS OR SYMPTOMS OF ACTIVE GI BLEED. LAST Hb 10.27 Oct 2013. HEME NEGATIVE x3 NOV 2015.  CHECK CBC TODAY.

## 2014-02-19 NOTE — Progress Notes (Signed)
cc'ed to pcp °

## 2014-02-19 NOTE — Progress Notes (Signed)
Subjective:    Patient ID: [name], male    DOB: 10/25/1945, 68 y.o.   MRN: [id number]  HPI When he was in hospital saw blood in stool. WEIGHT STABLE: 168 LBS FROM 165 LBS NOV 2014. BMs:  USU. EVERY DAY BUT OCCASIONAL CONSTIPATION AND SOFT STOOL. USING MIRALAX EVERY DAY IN HIS YOGUR[place]. VOLTAREN HELPS FOR JOINT PAIN.  PT DENIES FEVER, CHILLS, HEMATOCHEZIA, nausea, vomiting, melena, diarrhea, CHEST PAIN, SHORTNESS OF BREATH,  constipation, abdominal pain, problems swallowing, OR heartburn or indigestion.   Past Medical History  Diagnosis Date  . Hypertension   . COPD (chronic obstructive pulmonary disease)   . Hyperlipidemia   . Cardiomyopathy     EF of 30% per echo in June of 2012; admitted with congestive heart failure; nonobstructive CAD on cath in 08/2010  . History of noncompliance with medical treatment   . Cerebrovascular disease 2011    CVA  in 02/2010-only deficit is decreased vision in  right eye  . Alcohol abuse   . Tobacco abuse     40 pack years  . Abdominal aortic aneurysm     Stent graft repair  . Stroke 2011    decreased vision of right eye  . Chronic systolic CHF (congestive heart failure)   . Leg pain   . CKD (chronic kidney disease) stage 3, GFR 30-59 ml/min     creatinine-1.7 in 08/2010  . CAD (coronary artery disease)   . Myocardial infarction acute 10/01/11  . CKD (chronic kidney disease), stage III 12/06/2012  . Effusion of right knee joint 12/06/2012  . Anemia   . Atrial fibrillation   . Ataxic gait   . Weakness generalized   . Frequent falls    Past Surgical History  Procedure Laterality Date  . Colonoscopy w/ polypectomy  2006    Dr. Tamala Julian: anal fissure, sessile adenomatous polyp, diverticulosis  . Esophagogastroduodenoscopy  11/15/2010    Procedure: ESOPHAGOGASTRODUODENOSCOPY (EGD);  Surgeon: Dorothyann Peng, MD;  Location: [facility];  Service: Endoscopy;  Laterality: N/A;  with propofol sedation; procedure start @ 0813  . Flexible sigmoidoscopy   11/15/2010    Procedure: FLEXIBLE SIGMOIDOSCOPY;  Surgeon: Dorothyann Peng, MD;  Location: [facility];  Service: Endoscopy;  Laterality: N/A;  with propofol sedation; ended @ 0808  . Polypectomy  12/13/2010    Procedure: POLYPECTOMY;  Surgeon: Dorothyann Peng, MD;  Location: [facility];  Service: Endoscopy;  Laterality: N/A;  right colon, cecal, transverse, and sigmoid polypectomy  . Cardiac catheterization  10/04/11    Normal left main, 95% pLAD stenosis with ulceration s/p successful BMS placement, 30% segmental plaque beyond this, dLAD after D2 with 50% plaquing, then focal 70% lesion, 80% focal short D2 stenosis, 30% pOM2 lesion, 30-40% mOM3 stenosis, Shephard's crook region of RCA with 50% lesion, mRCA 50-60% lesion and mild dRCA irregularities.   . Coronary stent placement  10/04/11  . Abdominal aortic aneurysm repair w/ endoluminal graft      01/16/2008     Review of Systems     Objective:   Physical Exam  Constitutional: He is oriented to person, place, and time. He appears well-developed and well-nourished. No distress.  HENT:  Head: Normocephalic and atraumatic.  Mouth/Throat: Oropharynx is clear and moist. No oropharyngeal exudate.  Eyes: Pupils are equal, round, and reactive to light. No scleral icterus.  Neck: Normal range of motion. Neck supple.  Cardiovascular: Normal rate, regular rhythm and normal heart sounds.   Pulmonary/Chest: Effort normal and  breath sounds normal. No respiratory distress.  Abdominal: Soft. Bowel sounds are normal. He exhibits no distension. There is no tenderness.  Musculoskeletal: He exhibits no edema.  Lymphadenopathy:    He has no cervical adenopathy.  Neurological: He is alert and oriented to person, place, and time.  NO  NEW FOCAL DEFICITS   Psychiatric:  FLAT AFFECT, NL MOOD   Vitals reviewed.         Assessment & Plan:

## 2014-02-26 LAB — CBC WITH DIFFERENTIAL/PLATELET
BASOS ABS: 0.1 10*3/uL (ref 0.0–0.1)
Basophils Relative: 1 % (ref 0–1)
Eosinophils Absolute: 0.3 10*3/uL (ref 0.0–0.7)
Eosinophils Relative: 4 % (ref 0–5)
HEMATOCRIT: 38.9 % — AB (ref 39.0–52.0)
Hemoglobin: 13.1 g/dL (ref 13.0–17.0)
LYMPHS PCT: 32 % (ref 12–46)
Lymphs Abs: 2.2 10*3/uL (ref 0.7–4.0)
MCH: 30.8 pg (ref 26.0–34.0)
MCHC: 33.7 g/dL (ref 30.0–36.0)
MCV: 91.5 fL (ref 78.0–100.0)
MPV: 10 fL (ref 9.4–12.4)
Monocytes Absolute: 0.6 10*3/uL (ref 0.1–1.0)
Monocytes Relative: 9 % (ref 3–12)
NEUTROS ABS: 3.7 10*3/uL (ref 1.7–7.7)
NEUTROS PCT: 54 % (ref 43–77)
PLATELETS: 200 10*3/uL (ref 150–400)
RBC: 4.25 MIL/uL (ref 4.22–5.81)
RDW: 15.4 % (ref 11.5–15.5)
WBC: 6.9 10*3/uL (ref 4.0–10.5)

## 2014-02-27 ENCOUNTER — Encounter (HOSPITAL_COMMUNITY): Payer: Self-pay | Admitting: Cardiology

## 2014-04-06 ENCOUNTER — Other Ambulatory Visit: Payer: Self-pay | Admitting: Family Medicine

## 2014-04-17 ENCOUNTER — Ambulatory Visit (INDEPENDENT_AMBULATORY_CARE_PROVIDER_SITE_OTHER): Payer: 59 | Admitting: Cardiovascular Disease

## 2014-04-17 ENCOUNTER — Encounter: Payer: Self-pay | Admitting: Cardiovascular Disease

## 2014-04-17 VITALS — BP 161/79 | HR 76 | Ht 75.0 in | Wt 160.0 lb

## 2014-04-17 DIAGNOSIS — N184 Chronic kidney disease, stage 4 (severe): Secondary | ICD-10-CM

## 2014-04-17 DIAGNOSIS — I1310 Hypertensive heart and chronic kidney disease without heart failure, with stage 1 through stage 4 chronic kidney disease, or unspecified chronic kidney disease: Secondary | ICD-10-CM

## 2014-04-17 DIAGNOSIS — E785 Hyperlipidemia, unspecified: Secondary | ICD-10-CM

## 2014-04-17 DIAGNOSIS — I119 Hypertensive heart disease without heart failure: Secondary | ICD-10-CM

## 2014-04-17 DIAGNOSIS — I5022 Chronic systolic (congestive) heart failure: Secondary | ICD-10-CM

## 2014-04-17 DIAGNOSIS — I2510 Atherosclerotic heart disease of native coronary artery without angina pectoris: Secondary | ICD-10-CM

## 2014-04-17 MED ORDER — EZETIMIBE 10 MG PO TABS: 10.0000 mg | ORAL_TABLET | Freq: Every day | ORAL | Status: DC

## 2014-04-17 MED ORDER — CARVEDILOL 3.125 MG PO TABS: 3.1250 mg | ORAL_TABLET | Freq: Two times a day (BID) | ORAL | Status: DC

## 2014-04-17 NOTE — Progress Notes (Signed)
Patient ID: [name], male   DOB: Jan 15, 1946, 69 y.o.   MRN: [id number]      SUBJECTIVE: The patient returns for follow up of CAD, chronic systolic heart failure, hyperlipidemia, and HTN. He denies chest pain, leg swelling, and shortness of breath. His BP remains elevated. Echocardiogram showed EF 35-40% on 10/02/11.  Review of Systems: As per "subjective", otherwise negative.  No Known Allergies  Current Outpatient Prescriptions  Medication Sig Dispense Refill  . acetaminophen (TYLENOL) 325 MG tablet Take 2 tablets (650 mg total) by mouth 3 (three) times daily as needed for pain.    Marland Kitchen amLODipine (NORVASC) 10 MG tablet Take 1 tablet (10 mg total) by mouth daily. 30 tablet 6  . aspirin 81 MG tablet Take 81 mg by mouth daily.    Marland Kitchen atorvastatin (LIPITOR) 80 MG tablet Take 1 tablet (80 mg total) by mouth daily at 6 PM. 90 tablet 3  . citalopram (CELEXA) 20 MG tablet TAKE 1/2 TABLET BY MOUTH EVERY DAY 15 tablet 5  . Multiple Vitamin (MULTIVITAMIN WITH MINERALS) TABS Take 1 tablet by mouth daily.    . pantoprazole (PROTONIX) 40 MG tablet Take 1 tablet (40 mg total) by mouth daily. 30 tablet 11  . polyethylene glycol (MIRALAX / GLYCOLAX) packet Take 17 g by mouth daily. 14 each 0  . VOLTAREN 1 % GEL   1  . [DISCONTINUED] cloNIDine (CATAPRES) 0.2 MG tablet Take 0.2 mg by mouth 2 (two) times daily.      . [DISCONTINUED] metoprolol (TOPROL-XL) 50 MG 24 hr tablet Take 50 mg by mouth daily.      . [DISCONTINUED] niacin (NIASPAN) 500 MG CR tablet Take 500 mg by mouth at bedtime.      . [DISCONTINUED] omeprazole (PRILOSEC) 20 MG capsule 1 po every morning 30 capsule 5   No current facility-administered medications for this visit.    Past Medical History  Diagnosis Date  . Hypertension   . COPD (chronic obstructive pulmonary disease)   . Hyperlipidemia   . Cardiomyopathy     EF of 30% per echo in June of 2012; admitted with congestive heart failure; nonobstructive CAD on cath in 08/2010  .  History of noncompliance with medical treatment   . Cerebrovascular disease 2011    CVA  in 02/2010-only deficit is decreased vision in  right eye  . Alcohol abuse   . Tobacco abuse     40 pack years  . Abdominal aortic aneurysm     Stent graft repair  . Stroke 2011    decreased vision of right eye  . Chronic systolic CHF (congestive heart failure)   . Leg pain   . CKD (chronic kidney disease) stage 3, GFR 30-59 ml/min     creatinine-1.7 in 08/2010  . CAD (coronary artery disease)   . Myocardial infarction acute 10/01/11  . CKD (chronic kidney disease), stage III 12/06/2012  . Effusion of right knee joint 12/06/2012  . Anemia   . Atrial fibrillation   . Ataxic gait   . Weakness generalized   . Frequent falls     Past Surgical History  Procedure Laterality Date  . Colonoscopy w/ polypectomy  2006    Dr. Tamala Julian: anal fissure, sessile adenomatous polyp, diverticulosis  . Esophagogastroduodenoscopy  11/15/2010    Procedure: ESOPHAGOGASTRODUODENOSCOPY (EGD);  Surgeon: Dorothyann Peng, MD;  Location: [facility];  Service: Endoscopy;  Laterality: N/A;  with propofol sedation; procedure start @ 0813  . Flexible sigmoidoscopy  11/15/2010  Procedure: FLEXIBLE SIGMOIDOSCOPY;  Surgeon: Dorothyann Peng, MD;  Location: [facility];  Service: Endoscopy;  Laterality: N/A;  with propofol sedation; ended @ 0808  . Polypectomy  12/13/2010    Procedure: POLYPECTOMY;  Surgeon: Dorothyann Peng, MD;  Location: [facility];  Service: Endoscopy;  Laterality: N/A;  right colon, cecal, transverse, and sigmoid polypectomy  . Cardiac catheterization  10/04/11    Normal left main, 95% pLAD stenosis with ulceration s/p successful BMS placement, 30% segmental plaque beyond this, dLAD after D2 with 50% plaquing, then focal 70% lesion, 80% focal short D2 stenosis, 30% pOM2 lesion, 30-40% mOM3 stenosis, Shephard's crook region of RCA with 50% lesion, mRCA 50-60% lesion and mild dRCA irregularities.   . Coronary stent placement  10/04/11    . Abdominal aortic aneurysm repair w/ endoluminal graft      01/16/2008  . Left heart catheterization with coronary angiogram N/A 10/04/2011    Procedure: LEFT HEART CATHETERIZATION WITH CORONARY ANGIOGRAM;  Surgeon: Hillary Bow, MD;  Location: [facility];  Service: Cardiovascular;  Laterality: N/A;    History   Social History  . Marital Status: Married    Spouse Name: N/A    Number of Children: N/A  . Years of Education: N/A   Occupational History  . Not on file.   Social History Main Topics  . Smoking status: Current Some Day Smoker -- 0.50 packs/day for 50 years    Types: Cigarettes    Start date: 06/25/1957  . Smokeless tobacco: Never Used  . Alcohol Use: 16.8 oz/week    28 Shots of liquor per week     Comment: quit about 1 month ago, prior to this would drink about 7 oz of liquor per day 10/2012  . Drug Use: No  . Sexual Activity: No   Other Topics Concern  . Not on file   Social History Narrative     Filed Vitals:   04/17/14 1011  BP: 161/79  Pulse: 76  Height: 6\' 3"  (1.905 m)  Weight: 160 lb (72.576 kg)    PHYSICAL EXAM General: NAD HEENT: Normal. Neck: No JVD, no thyromegaly. Lungs: Clear to auscultation bilaterally with normal respiratory effort. CV: Nondisplaced PMI.  Regular rate and rhythm, normal S1/S2, no S3/S4, no murmur. No pretibial or periankle edema.  No carotid bruit.  Normal pedal pulses.  Abdomen: Soft, nontender, no hepatosplenomegaly, no distention.  Neurologic: Alert and oriented x 3.  Psych: Normal affect. Skin: Normal. Musculoskeletal: Normal range of motion, no gross deformities. Extremities: No clubbing or cyanosis.   ECG: Most recent ECG reviewed.      ASSESSMENT AND PLAN: 1. CAD: Stable ischemic heart disease. Continue aspirin and Lipitor. Had been bradycardic and thus beta blocker was previously stopped. Resting heart rate is 76 beats per minute without AV nodal blocking agents today. Due to elevated BP, I will start  low-dose Coreg 3.125 mg bid. 2. Malignant HTN: Remains elevated on amlodipine 10 mg. Will start Coreg 3.125 mg bid and monitor HR. 3. Chronic systolic heart failure: Euvolemic and stable. Not on diuretics at present.  4. Hyperlipidemia: Lipids on 01/20/14 showed elevated LDL of 126 on Lipitor 80 mg. Will add Zetia 10 mg and repeat in 3 months. AST/ALT were normal. 5. CKD stage 4: Elevated BUN/SCr 34/2.95 on 01/21/15. PCP has referred to nephrology but patient says he hasn't seen one yet.  Dispo: f/u 6 months.   Kate Sable, M.D., F.A.C.C.

## 2014-04-17 NOTE — Patient Instructions (Signed)
   Begin Zetia 10mg  daily  Begin Coreg 3.125mg  twice a day    New prescriptions have been sent to your pharmacy. Continue all other medications.   Labs for lipids - due in 3 months.  Office will send reminder in the mail.  Your physician wants you to follow up in: 6 months.  You will receive a reminder letter in the mail one-two months in advance.  If you don't receive a letter, please call our office to schedule the follow up appointment

## 2014-04-28 ENCOUNTER — Other Ambulatory Visit (HOSPITAL_COMMUNITY): Payer: Medicare Other

## 2014-04-28 ENCOUNTER — Ambulatory Visit: Payer: Medicare Other | Admitting: Family

## 2014-05-12 ENCOUNTER — Encounter: Payer: Self-pay | Admitting: Family

## 2014-05-13 ENCOUNTER — Ambulatory Visit (HOSPITAL_COMMUNITY)
Admission: RE | Admit: 2014-05-13 | Discharge: 2014-05-13 | Disposition: A | Discharge status: Home / Self Care | Payer: Medicare Other | Source: Ambulatory Visit | Attending: Family | Admitting: Family

## 2014-05-13 ENCOUNTER — Encounter: Payer: Self-pay | Admitting: Family

## 2014-05-13 ENCOUNTER — Ambulatory Visit (INDEPENDENT_AMBULATORY_CARE_PROVIDER_SITE_OTHER): Payer: Medicare Other | Admitting: Family

## 2014-05-13 VITALS — BP 161/87 | HR 56 | Resp 16 | Ht 75.0 in | Wt 168.0 lb

## 2014-05-13 DIAGNOSIS — I714 Abdominal aortic aneurysm, without rupture, unspecified: Secondary | ICD-10-CM

## 2014-05-13 DIAGNOSIS — Z87891 Personal history of nicotine dependence: Secondary | ICD-10-CM | POA: Diagnosis not present

## 2014-05-13 DIAGNOSIS — Z95828 Presence of other vascular implants and grafts: Secondary | ICD-10-CM

## 2014-05-13 DIAGNOSIS — Z48812 Encounter for surgical aftercare following surgery on the circulatory system: Secondary | ICD-10-CM | POA: Diagnosis not present

## 2014-05-13 NOTE — Progress Notes (Signed)
[facility] [place]  Established EVAR  History of Present Illness  [name] is a 69 y.o. (1946-02-24) male patient of Dr. [name] who returns for follow up surveillance of EVAR (01/16/2008 using a Cook Zenith device).  The patient has no back or abdominal pain. He denies claudication pain in legs, denies non-healing wounds.  He had an MI in July 2013 and completed cardiac rehabilitation. A coronary stent was placed. He reports several strokes and does not know when his last stroke was; manifested as some degree of loss of vision and trouble speaking, denies hemiplegia. He states he quit ETOH use in 2015, states he stopped that for now, documented 28 shots of liquor/week in the past. Off balance at times, the reason he uses walker. Noted that carotid Duplex done January, 2014 shows <40% bilateral ICA stenosis.  He takes a daily statin and ASA, no other antiplatelet nor anticoagulant medications.    Pt Diabetic: No Pt smoker: smoker (3/4 ppd x 50+ yrs, quit 10 days ago, trying not to resume)  Dr. [name] reviewed pt's CT angiogram at pt's February 2015 visit. This showed a stable aneurysm sac. Diameter measurements were 4.1 x 2.8. There was no evidence of endoleak. There had been a slight increase in the size of the right common iliac artery. This had increased from 2.3 up to 2.6 when compared to his study in June of 2010 No evidence of complicating features on CT scan.Will need to pay attention to the dilated right common iliac artery on future studies.  Dr. [name] scheduled the patient to come back in one year with an abdominal ultrasound.   Past Medical History  Diagnosis Date  . Hypertension   . COPD (chronic obstructive pulmonary disease)   . Hyperlipidemia   . Cardiomyopathy     EF of 30% per echo in June of 2012; admitted with congestive heart failure; nonobstructive CAD on cath in 08/2010  . History of noncompliance with medical treatment    . Cerebrovascular disease 2011    CVA  in 02/2010-only deficit is decreased vision in  right eye  . Alcohol abuse   . Tobacco abuse     40 pack years  . Abdominal aortic aneurysm     Stent graft repair  . Stroke 2011    decreased vision of right eye  . Chronic systolic CHF (congestive heart failure)   . Leg pain   . CKD (chronic kidney disease) stage 3, GFR 30-59 ml/min     creatinine-1.7 in 08/2010  . CAD (coronary artery disease)   . Myocardial infarction acute 10/01/11  . CKD (chronic kidney disease), stage III 12/06/2012  . Effusion of right knee joint 12/06/2012  . Anemia   . Atrial fibrillation   . Ataxic gait   . Weakness generalized   . Frequent falls    Past Surgical History  Procedure Laterality Date  . Colonoscopy w/ polypectomy  2006    Dr. Tamala Julian: anal fissure, sessile adenomatous polyp, diverticulosis  . Esophagogastroduodenoscopy  11/15/2010    Procedure: ESOPHAGOGASTRODUODENOSCOPY (EGD);  Surgeon: Dorothyann Peng, MD;  Location: [facility];  Service: Endoscopy;  Laterality: N/A;  with propofol sedation; procedure start @ 0813  . Flexible sigmoidoscopy  11/15/2010    Procedure: FLEXIBLE SIGMOIDOSCOPY;  Surgeon: Dorothyann Peng, MD;  Location: [facility];  Service: Endoscopy;  Laterality: N/A;  with propofol sedation; ended @ 0808  . Polypectomy  12/13/2010    Procedure: POLYPECTOMY;  Surgeon: Hope Pigeon  Fields, MD;  Location: [facility];  Service: Endoscopy;  Laterality: N/A;  right colon, cecal, transverse, and sigmoid polypectomy  . Cardiac catheterization  10/04/11    Normal left main, 95% pLAD stenosis with ulceration s/p successful BMS placement, 30% segmental plaque beyond this, dLAD after D2 with 50% plaquing, then focal 70% lesion, 80% focal short D2 stenosis, 30% pOM2 lesion, 30-40% mOM3 stenosis, Shephard's crook region of RCA with 50% lesion, mRCA 50-60% lesion and mild dRCA irregularities.   . Coronary stent placement  10/04/11  . Abdominal aortic aneurysm repair w/ endoluminal  graft      01/16/2008  . Left heart catheterization with coronary angiogram N/A 10/04/2011    Procedure: LEFT HEART CATHETERIZATION WITH CORONARY ANGIOGRAM;  Surgeon: Hillary Bow, MD;  Location: [facility] CATH LAB;  Service: Cardiovascular;  Laterality: N/A;   Social History History  Substance Use Topics  . Smoking status: Current Some Day Smoker -- 0.50 packs/day for 50 years    Types: Cigarettes    Start date: 06/25/1957  . Smokeless tobacco: Never Used  . Alcohol Use: 16.8 oz/week    28 Shots of liquor per week     Comment: quit about 1 month ago, prior to this would drink about 7 oz of liquor per day 10/2012   Family History Family History  Problem Relation Age of Onset  . Colon cancer Neg Hx   . Anesthesia problems Neg Hx   . Hypotension Neg Hx   . Malignant hyperthermia Neg Hx   . Pseudochol deficiency Neg Hx   . Cancer Mother   . Heart disease Mother     before age 5  . Hyperlipidemia Mother   . Hypertension Mother    Current Outpatient Prescriptions on File Prior to Visit  Medication Sig Dispense Refill  . acetaminophen (TYLENOL) 325 MG tablet Take 2 tablets (650 mg total) by mouth 3 (three) times daily as needed for pain. (Patient taking differently: Take 650 mg by mouth as needed. )    . amLODipine (NORVASC) 10 MG tablet Take 1 tablet (10 mg total) by mouth daily. 30 tablet 6  . aspirin 81 MG tablet Take 81 mg by mouth daily.    Marland Kitchen atorvastatin (LIPITOR) 80 MG tablet Take 1 tablet (80 mg total) by mouth daily at 6 PM. 90 tablet 3  . carvedilol (COREG) 3.125 MG tablet Take 1 tablet (3.125 mg total) by mouth 2 (two) times daily. 60 tablet 6  . citalopram (CELEXA) 20 MG tablet TAKE 1/2 TABLET BY MOUTH EVERY DAY 15 tablet 5  . ezetimibe (ZETIA) 10 MG tablet Take 1 tablet (10 mg total) by mouth daily. 30 tablet 6  . Multiple Vitamin (MULTIVITAMIN WITH MINERALS) TABS Take 1 tablet by mouth daily.    . pantoprazole (PROTONIX) 40 MG tablet Take 1 tablet (40 mg total) by mouth  daily. 30 tablet 11  . polyethylene glycol (MIRALAX / GLYCOLAX) packet Take 17 g by mouth daily. 14 each 0  . VOLTAREN 1 % GEL as needed. Bilateral Knee  1  . [DISCONTINUED] cloNIDine (CATAPRES) 0.2 MG tablet Take 0.2 mg by mouth 2 (two) times daily.      . [DISCONTINUED] metoprolol (TOPROL-XL) 50 MG 24 hr tablet Take 50 mg by mouth daily.      . [DISCONTINUED] niacin (NIASPAN) 500 MG CR tablet Take 500 mg by mouth at bedtime.      . [DISCONTINUED] omeprazole (PRILOSEC) 20 MG capsule 1 po every morning 30 capsule 5  No current facility-administered medications on file prior to visit.   No Known Allergies   ROS: See HPI for pertinent positives and negatives.  Physical Examination  Filed Vitals:   05/13/14 0918  BP: 161/87  Pulse: 56  Resp: 16  Height: 6\' 3"  (1.905 m)  Weight: 168 lb (76.204 kg)  SpO2: 100%   Body mass index is 21 kg/(m^2).   General: A&O x 3, WD, using walker.  Pulmonary: Sym exp, good air movt, CTAB, no rales, rhonchi, & wheezing.   Cardiac: RRR, Nl S1, S2, no detected murmur.  Vascular: Vessel Right Left  Radial Palpable Palpable  Carotid without bruit without bruit  Aorta Not palpable N/A  Femoral Palpable Palpable  Popliteal Not palpable Not palpable  PT notPalpable 1+Palpable  DP notPalpable notPalpable   Gastrointestinal: soft, NTND, -G/R, - HSM, - palpable masses, - CVAT B.  Musculoskeletal: M/S 5/5 in UE's, 4/5 in LE's, Extremities without ischemic changes.  Neurologic: Pain and light touch intact in extremities, Motor exam as listed above. CN 2-12 intact.          Non-Invasive Vascular Imaging  EVAR Duplex (Date: 05/13/2014)  ABDOMINAL AORTA DUPLEX EVALUATION - POST ENDOVASCULAR REPAIR    INDICATION: Evaluation of endovascular abdominal repair of aortic aneurysm.    PREVIOUS INTERVENTION(S): Endovascular repair of abdominal aortic aneurysm on 01/16/2008.    DUPLEX EXAM:      DIAMETER AP (cm)  DIAMETER TRANSVERSE (cm) VELOCITIES (cm/sec)  Aorta 3.79 3.64 27  Right Common Iliac 1.1 1.5 nv  Left Common Iliac  1.2 1.4 121    Comparison Study       Date DIAMETER AP (cm) DIAMETER TRANSVERSE (cm)  04/05/2013 3.77 3.75     ADDITIONAL FINDINGS:     IMPRESSION: Patent endovascular abdominal aortic repair. Unable to adequately visualize the right common iliac artery due to overlying bowel gas.    Compared to the previous exam:  No significant changes when compared to the previous exam; this may be due to overlying bowel gas at the time of his exam's which creates difficult visualization.        Medical Decision Making  CESARIO WEIDINGER is a 69 y.o. male who presents s/p EVAR (Date (01/16/2008 using a Cook Zenith device).  Pt is asymptomatic. Dr. [name] reviewed pt's CT angiogram at pt's February 2015 visit. This showed a stable aneurysm sac. Diameter measurements were 4.1 x 2.8. There was no evidence of endoleak. There had been a slight increase in the size of the right common iliac artery. This had increased from 2.3 up to 2.6 when compared to his study in June of 2010 No evidence of complicating features on CT scan.Will need to pay attention to the dilated right common iliac artery on future studies.  Dr. [name] scheduled the patient to come back in one year with an abdominal ultrasound. Today's EVAR Duplex reveals a patent endovascular abdominal aortic repair. Unable to adequately visualize the right common iliac artery due to overlying bowel gas. No significant changes when compared to the previous exam; this may be due to overlying bowel gas at the time of his exam's which creates difficult visualization.   I discussed with the patient the importance of surveillance of the endograft.  The next endograft duplex will be scheduled for 6 months to include iliac arteries. Face to face time with patient was 25 minutes. Over 50% of this time was spent on counseling and  coordination of care.  The patient will follow up  with [place] in 6 months with these studies. Unfortunately he would like to continues to smoke but his wife is preventing access to cigarettes. The patient was counseled re smoking cessation and given several free resources re smoking cessation.  I emphasized the importance of maximal medical management including strict control of blood pressure, blood glucose, and lipid levels, antiplatelet agents, obtaining regular exercise, and cessation of smoking.   The patient was given information about AAA including signs, symptoms, treatment, and how to minimize the risk of enlargement and rupture of aneurysms.    Thank you for allowing [place] to participate in this patient's care.  Clemon Chambers, RN, MSN, FNP-C [facility] [place] Office: [phone]  Clinic Physician: Early  05/13/2014, 9:15 AM

## 2014-05-13 NOTE — Patient Instructions (Signed)
Abdominal Aortic Aneurysm An aneurysm is a weakened or damaged part of an artery wall that bulges from the normal force of blood pumping through the body. An abdominal aortic aneurysm is an aneurysm that occurs in the lower part of the aorta, the main artery of the body.  The major concern with an abdominal aortic aneurysm is that it can enlarge and burst (rupture) or blood can flow between the layers of the wall of the aorta through a tear (aorticdissection). Both of these conditions can cause bleeding inside the body and can be life threatening unless diagnosed and treated promptly. CAUSES  The exact cause of an abdominal aortic aneurysm is unknown. Some contributing factors are:   A hardening of the arteries caused by the buildup of fat and other substances in the lining of a blood vessel (arteriosclerosis).  Inflammation of the walls of an artery (arteritis).   Connective tissue diseases, such as Marfan syndrome.   Abdominal trauma.   An infection, such as syphilis or staphylococcus, in the wall of the aorta (infectious aortitis) caused by bacteria. RISK FACTORS  Risk factors that contribute to an abdominal aortic aneurysm may include:  Age older than 60 years.   High blood pressure (hypertension).  Male gender.  Ethnicity (white race).  Obesity.  Family history of aneurysm (first degree relatives only).  Tobacco use. PREVENTION  The following healthy lifestyle habits may help decrease your risk of abdominal aortic aneurysm:  Quitting smoking. Smoking can raise your blood pressure and cause arteriosclerosis.  Limiting or avoiding alcohol.  Keeping your blood pressure, blood sugar level, and cholesterol levels within normal limits.  Decreasing your salt intake. In somepeople, too much salt can raise blood pressure and increase your risk of abdominal aortic aneurysm.  Eating a diet low in saturated fats and cholesterol.  Increasing your fiber intake by including  whole grains, vegetables, and fruits in your diet. Eating these foods may help lower blood pressure.  Maintaining a healthy weight.  Staying physically active and exercising regularly. SYMPTOMS  The symptoms of abdominal aortic aneurysm may vary depending on the size and rate of growth of the aneurysm.Most grow slowly and do not have any symptoms. When symptoms do occur, they may include:  Pain (abdomen, side, lower back, or groin). The pain may vary in intensity. A sudden onset of severe pain may indicate that the aneurysm has ruptured.  Feeling full after eating only small amounts of food.  Nausea or vomiting or both.  Feeling a pulsating lump in the abdomen.  Feeling faint or passing out. DIAGNOSIS  Since most unruptured abdominal aortic aneurysms have no symptoms, they are often discovered during diagnostic exams for other conditions. An aneurysm may be found during the following procedures:  Ultrasonography (A one-time screening for abdominal aortic aneurysm by ultrasonography is also recommended for all men aged 65-75 years who have ever smoked).  X-ray exams.  A computed tomography (CT).  Magnetic resonance imaging (MRI).  Angiography or arteriography. TREATMENT  Treatment of an abdominal aortic aneurysm depends on the size of your aneurysm, your age, and risk factors for rupture. Medication to control blood pressure and pain may be used to manage aneurysms smaller than 6 cm. Regular monitoring for enlargement may be recommended by your caregiver if:  The aneurysm is 3-4 cm in size (an annual ultrasonography may be recommended).  The aneurysm is 4-4.5 cm in size (an ultrasonography every 6 months may be recommended).  The aneurysm is larger than 4.5 cm in   size (your caregiver may ask that you be examined by a vascular surgeon). If your aneurysm is larger than 6 cm, surgical repair may be recommended. There are two main methods for repair of an aneurysm:   Endovascular  repair (a minimally invasive surgery). This is done most often.  Open repair. This method is used if an endovascular repair is not possible. Document Released: 12/15/2004 Document Revised: 07/02/2012 Document Reviewed: 04/06/2012 Exit[employer] Patient Information 2015 [employer]. This information is not intended to replace advice given to you by your health care provider. Make sure you discuss any questions you have with your health care provider.   Smoking Cessation Quitting smoking is important to your health and has many advantages. However, it is not always easy to quit since nicotine is a very addictive drug. Oftentimes, people try 3 times or more before being able to quit. This document explains the best ways for you to prepare to quit smoking. Quitting takes hard work and a lot of effort, but you can do it. ADVANTAGES OF QUITTING SMOKING  You will live longer, feel better, and live better.  Your body will feel the impact of quitting smoking almost immediately.  Within 20 minutes, blood pressure decreases. Your pulse returns to its normal level.  After 8 hours, carbon monoxide levels in the blood return to normal. Your oxygen level increases.  After 24 hours, the chance of having a heart attack starts to decrease. Your breath, hair, and body stop smelling like smoke.  After 48 hours, damaged nerve endings begin to recover. Your sense of taste and smell improve.  After 72 hours, the body is virtually free of nicotine. Your bronchial tubes relax and breathing becomes easier.  After 2 to 12 weeks, lungs can hold more air. Exercise becomes easier and circulation improves.  The risk of having a heart attack, stroke, cancer, or lung disease is greatly reduced.  After 1 year, the risk of coronary heart disease is cut in half.  After 5 years, the risk of stroke falls to the same as a nonsmoker.  After 10 years, the risk of lung cancer is cut in half and the risk of other cancers  decreases significantly.  After 15 years, the risk of coronary heart disease drops, usually to the level of a nonsmoker.  If you are pregnant, quitting smoking will improve your chances of having a healthy baby.  The people you live with, especially any children, will be healthier.  You will have extra money to spend on things other than cigarettes. QUESTIONS TO THINK ABOUT BEFORE ATTEMPTING TO QUIT You may want to talk about your answers with your health care provider.  Why do you want to quit?  If you tried to quit in the past, what helped and what did not?  What will be the most difficult situations for you after you quit? How will you plan to handle them?  Who can help you through the tough times? Your family? Friends? A health care provider?  What pleasures do you get from smoking? What ways can you still get pleasure if you quit? Here are some questions to ask your health care provider:  How can you help me to be successful at quitting?  What medicine do you think would be best for me and how should I take it?  What should I do if I need more help?  What is smoking withdrawal like? How can I get information on withdrawal? GET READY  Set a quit   date.  Change your environment by getting rid of all cigarettes, ashtrays, matches, and lighters in your home, car, or work. Do not let people smoke in your home.  Review your past attempts to quit. Think about what worked and what did not. GET SUPPORT AND ENCOURAGEMENT You have a better chance of being successful if you have help. You can get support in many ways.  Tell your family, friends, and coworkers that you are going to quit and need their support. Ask them not to smoke around you.  Get individual, group, or telephone counseling and support. Programs are available at local hospitals and health centers. Call your local health department for information about programs in your area.  Spiritual beliefs and practices may  help some smokers quit.  Download a "quit meter" on your computer to keep track of quit statistics, such as how long you have gone without smoking, cigarettes not smoked, and money saved.  Get a self-help book about quitting smoking and staying off tobacco. LEARN NEW SKILLS AND BEHAVIORS  Distract yourself from urges to smoke. Talk to someone, go for a walk, or occupy your time with a task.  Change your normal routine. Take a different route to work. Drink tea instead of coffee. Eat breakfast in a different place.  Reduce your stress. Take a hot bath, exercise, or read a book.  Plan something enjoyable to do every day. Reward yourself for not smoking.  Explore interactive web-based programs that specialize in helping you quit. GET MEDICINE AND USE IT CORRECTLY Medicines can help you stop smoking and decrease the urge to smoke. Combining medicine with the above behavioral methods and support can greatly increase your chances of successfully quitting smoking.  Nicotine replacement therapy helps deliver nicotine to your body without the negative effects and risks of smoking. Nicotine replacement therapy includes nicotine gum, lozenges, inhalers, nasal sprays, and skin patches. Some may be available over-the-counter and others require a prescription.  Antidepressant medicine helps people abstain from smoking, but how this works is unknown. This medicine is available by prescription.  Nicotinic receptor partial agonist medicine simulates the effect of nicotine in your brain. This medicine is available by prescription. Ask your health care provider for advice about which medicines to use and how to use them based on your health history. Your health care provider will tell you what side effects to look out for if you choose to be on a medicine or therapy. Carefully read the information on the package. Do not use any other product containing nicotine while using a nicotine replacement product.    RELAPSE OR DIFFICULT SITUATIONS Most relapses occur within the first 3 months after quitting. Do not be discouraged if you start smoking again. Remember, most people try several times before finally quitting. You may have symptoms of withdrawal because your body is used to nicotine. You may crave cigarettes, be irritable, feel very hungry, cough often, get headaches, or have difficulty concentrating. The withdrawal symptoms are only temporary. They are strongest when you first quit, but they will go away within 10-14 days. To reduce the chances of relapse, try to:  Avoid drinking alcohol. Drinking lowers your chances of successfully quitting.  Reduce the amount of caffeine you consume. Once you quit smoking, the amount of caffeine in your body increases and can give you symptoms, such as a rapid heartbeat, sweating, and anxiety.  Avoid smokers because they can make you want to smoke.  Do not let weight gain distract you. Many   smokers will gain weight when they quit, usually less than 10 pounds. Eat a healthy diet and stay active. You can always lose the weight gained after you quit.  Find ways to improve your mood other than smoking. FOR MORE INFORMATION  www.smokefree.gov  Document Released: 03/01/2001 Document Revised: 07/22/2013 Document Reviewed: 06/16/2011 [employer] Patient Information 2015 [employer]. This information is not intended to replace advice given to you by your health care provider. Make sure you discuss any questions you have with your health care provider.   Smoking Cessation, Tips for Success If you are ready to quit smoking, congratulations! You have chosen to help yourself be healthier. Cigarettes bring nicotine, tar, carbon monoxide, and other irritants into your body. Your lungs, heart, and blood vessels will be able to work better without these poisons. There are many different ways to quit smoking. Nicotine gum, nicotine patches, a nicotine inhaler, or nicotine  nasal spray can help with physical craving. Hypnosis, support groups, and medicines help break the habit of smoking. WHAT THINGS CAN I DO TO MAKE QUITTING EASIER?  Here are some tips to help you quit for good:  Pick a date when you will quit smoking completely. Tell all of your friends and family about your plan to quit on that date.  Do not try to slowly cut down on the number of cigarettes you are smoking. Pick a quit date and quit smoking completely starting on that day.  Throw away all cigarettes.   Clean and remove all ashtrays from your home, work, and car.  On a card, write down your reasons for quitting. Carry the card with you and read it when you get the urge to smoke.  Cleanse your body of nicotine. Drink enough water and fluids to keep your urine clear or pale yellow. Do this after quitting to flush the nicotine from your body.  Learn to predict your moods. Do not let a bad situation be your excuse to have a cigarette. Some situations in your life might tempt you into wanting a cigarette.  Never have "just one" cigarette. It leads to wanting another and another. Remind yourself of your decision to quit.  Change habits associated with smoking. If you smoked while driving or when feeling stressed, try other activities to replace smoking. Stand up when drinking your coffee. Brush your teeth after eating. Sit in a different chair when you read the paper. Avoid alcohol while trying to quit, and try to drink fewer caffeinated beverages. Alcohol and caffeine may urge you to smoke.  Avoid foods and drinks that can trigger a desire to smoke, such as sugary or spicy foods and alcohol.  Ask people who smoke not to smoke around you.  Have something planned to do right after eating or having a cup of coffee. For example, plan to take a walk or exercise.  Try a relaxation exercise to calm you down and decrease your stress. Remember, you may be tense and nervous for the first 2 weeks after  you quit, but this will pass.  Find new activities to keep your hands busy. Play with a pen, coin, or rubber band. Doodle or draw things on paper.  Brush your teeth right after eating. This will help cut down on the craving for the taste of tobacco after meals. You can also try mouthwash.   Use oral substitutes in place of cigarettes. Try using lemon drops, carrots, cinnamon sticks, or chewing gum. Keep them handy so they are available when you   have the urge to smoke.  When you have the urge to smoke, try deep breathing.  Designate your home as a nonsmoking area.  If you are a heavy smoker, ask your health care provider about a prescription for nicotine chewing gum. It can ease your withdrawal from nicotine.  Reward yourself. Set aside the cigarette money you save and buy yourself something nice.  Look for support from others. Join a support group or smoking cessation program. Ask someone at home or at work to help you with your plan to quit smoking.  Always ask yourself, "Do I need this cigarette or is this just a reflex?" Tell yourself, "Today, I choose not to smoke," or "I do not want to smoke." You are reminding yourself of your decision to quit.  Do not replace cigarette smoking with electronic cigarettes (commonly called e-cigarettes). The safety of e-cigarettes is unknown, and some may contain harmful chemicals.  If you relapse, do not give up! Plan ahead and think about what you will do the next time you get the urge to smoke. HOW WILL I FEEL WHEN I QUIT SMOKING? You may have symptoms of withdrawal because your body is used to nicotine (the addictive substance in cigarettes). You may crave cigarettes, be irritable, feel very hungry, cough often, get headaches, or have difficulty concentrating. The withdrawal symptoms are only temporary. They are strongest when you first quit but will go away within 10-14 days. When withdrawal symptoms occur, stay in control. Think about your reasons  for quitting. Remind yourself that these are signs that your body is healing and getting used to being without cigarettes. Remember that withdrawal symptoms are easier to treat than the major diseases that smoking can cause.  Even after the withdrawal is over, expect periodic urges to smoke. However, these cravings are generally short lived and will go away whether you smoke or not. Do not smoke! WHAT RESOURCES ARE AVAILABLE TO HELP ME QUIT SMOKING? Your health care provider can direct you to community resources or hospitals for support, which may include:  Group support.  Education.  Hypnosis.  Therapy. Document Released: 12/04/2003 Document Revised: 07/22/2013 Document Reviewed: 08/23/2012 Exit[employer] Patient Information 2015 [employer]. This information is not intended to replace advice given to you by your health care provider. Make sure you discuss any questions you have with your health care provider.   

## 2014-05-14 NOTE — Addendum Note (Signed)
Addended by: Mena Goes on: 05/14/2014 02:09 PM   Modules accepted: Orders

## 2014-05-19 ENCOUNTER — Ambulatory Visit (INDEPENDENT_AMBULATORY_CARE_PROVIDER_SITE_OTHER): Payer: Medicare Other | Admitting: Family Medicine

## 2014-05-19 ENCOUNTER — Encounter: Payer: Self-pay | Admitting: Family Medicine

## 2014-05-19 VITALS — BP 160/90 | Ht 75.0 in | Wt 169.0 lb

## 2014-05-19 DIAGNOSIS — I10 Essential (primary) hypertension: Secondary | ICD-10-CM | POA: Diagnosis not present

## 2014-05-19 DIAGNOSIS — N184 Chronic kidney disease, stage 4 (severe): Secondary | ICD-10-CM

## 2014-05-19 LAB — BASIC METABOLIC PANEL
BUN: 40 mg/dL — AB (ref 6–23)
CALCIUM: 9.2 mg/dL (ref 8.4–10.5)
CO2: 24 mEq/L (ref 19–32)
Chloride: 105 mEq/L (ref 96–112)
Creat: 4.79 mg/dL — ABNORMAL HIGH (ref 0.50–1.35)
Glucose, Bld: 66 mg/dL — ABNORMAL LOW (ref 70–99)
POTASSIUM: 5.5 meq/L — AB (ref 3.5–5.3)
SODIUM: 140 meq/L (ref 135–145)

## 2014-05-19 MED ORDER — CLONIDINE HCL 0.1 MG PO TABS: 0.1000 mg | ORAL_TABLET | Freq: Two times a day (BID) | ORAL | Status: DC

## 2014-05-19 NOTE — Progress Notes (Signed)
   Subjective:    Patient ID: [name], male    DOB: [birth year], 69 y.o.   MRN: [id number]  Hyperlipidemia This is a chronic problem. The current episode started more than 1 year ago. The problem is controlled. Exacerbating diseases include chronic renal disease. There are no known factors aggravating his hyperlipidemia. Pertinent negatives include no chest pain or shortness of breath. Current antihyperlipidemic treatment includes statins. The current treatment provides significant improvement of lipids. There are no compliance problems.  There are no known risk factors for coronary artery disease.  Hypertension This is a chronic problem. The current episode started more than 1 year ago. The problem has been waxing and waning since onset. The problem is resistant. Pertinent negatives include no chest pain, peripheral edema, PND or shortness of breath. There are no associated agents to hypertension. Risk factors for coronary artery disease include dyslipidemia, male gender and smoking/tobacco exposure. Past treatments include ACE inhibitors, calcium channel blockers, diuretics and lifestyle changes. The current treatment provides mild improvement. There are no compliance problems.  Hypertensive end-organ damage includes kidney disease, CVA and heart failure. There is no history of angina, CAD/MI, left ventricular hypertrophy or retinopathy. Identifiable causes of hypertension include chronic renal disease.   Patient states that he has no concerns at this time.    Review of Systems  Constitutional: Negative for activity change, appetite change and fatigue.  HENT: Negative for congestion.   Respiratory: Negative for cough and shortness of breath.   Cardiovascular: Negative for chest pain and PND.  Gastrointestinal: Negative for abdominal pain.  Endocrine: Negative for polydipsia and polyphagia.  Neurological: Negative for weakness.  Psychiatric/Behavioral: Negative for confusion.         Objective:   Physical Exam  Constitutional: He appears well-nourished. No distress.  Cardiovascular: Normal rate, regular rhythm and normal heart sounds.   No murmur heard. Pulmonary/Chest: Effort normal and breath sounds normal. No respiratory distress.  Musculoskeletal: He exhibits no edema.  Lymphadenopathy:    He has no cervical adenopathy.  Neurological: He is alert.  Psychiatric: His behavior is normal.  Vitals reviewed.   Blood pressure was checked twice 150/70 was the best reading extremities no edema  Patient states that his moods are doing okay denies being depressed. Feels his thinking is doing okay. Wife was talked with.    Assessment & Plan:  1. Essential hypertension We will go ahead and add clonidine 0.1 mg take 1 tablet twice daily to help. Follow-up patient in 4 weeks. Heart healthy diet discussed. - Basic metabolic panel  2. Chronic kidney disease, stage 4 (severe) Severe renal troubles has seen a nephrologist in the past we will refer him back there. Repeat metabolic 7. - Ambulatory referral to Nephrology  Patient smokes he was advised to quit smoking to lessen the chance of strokes History of strokes no sign of any current strokes

## 2014-05-23 ENCOUNTER — Encounter (HOSPITAL_COMMUNITY): Payer: Self-pay | Admitting: Emergency Medicine

## 2014-05-23 ENCOUNTER — Emergency Department (HOSPITAL_COMMUNITY): Payer: Medicare Other

## 2014-05-23 ENCOUNTER — Inpatient Hospital Stay (HOSPITAL_COMMUNITY)
Admission: EM | Admit: 2014-05-23 | Discharge: 2014-05-26 | DRG: 065 | Disposition: A | Discharge status: Home / Self Care | Payer: Medicare Other | Attending: Neurology | Admitting: Neurology

## 2014-05-23 DIAGNOSIS — I4891 Unspecified atrial fibrillation: Secondary | ICD-10-CM | POA: Diagnosis present

## 2014-05-23 DIAGNOSIS — I129 Hypertensive chronic kidney disease with stage 1 through stage 4 chronic kidney disease, or unspecified chronic kidney disease: Secondary | ICD-10-CM | POA: Diagnosis present

## 2014-05-23 DIAGNOSIS — I252 Old myocardial infarction: Secondary | ICD-10-CM

## 2014-05-23 DIAGNOSIS — R42 Dizziness and giddiness: Secondary | ICD-10-CM

## 2014-05-23 DIAGNOSIS — D649 Anemia, unspecified: Secondary | ICD-10-CM | POA: Diagnosis present

## 2014-05-23 DIAGNOSIS — Z7982 Long term (current) use of aspirin: Secondary | ICD-10-CM | POA: Diagnosis not present

## 2014-05-23 DIAGNOSIS — I429 Cardiomyopathy, unspecified: Secondary | ICD-10-CM | POA: Diagnosis present

## 2014-05-23 DIAGNOSIS — I619 Nontraumatic intracerebral hemorrhage, unspecified: Secondary | ICD-10-CM | POA: Diagnosis present

## 2014-05-23 DIAGNOSIS — I5022 Chronic systolic (congestive) heart failure: Secondary | ICD-10-CM | POA: Diagnosis present

## 2014-05-23 DIAGNOSIS — I639 Cerebral infarction, unspecified: Secondary | ICD-10-CM

## 2014-05-23 DIAGNOSIS — I2510 Atherosclerotic heart disease of native coronary artery without angina pectoris: Secondary | ICD-10-CM | POA: Diagnosis present

## 2014-05-23 DIAGNOSIS — Z955 Presence of coronary angioplasty implant and graft: Secondary | ICD-10-CM

## 2014-05-23 DIAGNOSIS — I616 Nontraumatic intracerebral hemorrhage, multiple localized: Secondary | ICD-10-CM

## 2014-05-23 DIAGNOSIS — I610 Nontraumatic intracerebral hemorrhage in hemisphere, subcortical: Secondary | ICD-10-CM | POA: Diagnosis not present

## 2014-05-23 DIAGNOSIS — Z79899 Other long term (current) drug therapy: Secondary | ICD-10-CM | POA: Diagnosis not present

## 2014-05-23 DIAGNOSIS — J449 Chronic obstructive pulmonary disease, unspecified: Secondary | ICD-10-CM | POA: Diagnosis present

## 2014-05-23 DIAGNOSIS — R296 Repeated falls: Secondary | ICD-10-CM | POA: Diagnosis present

## 2014-05-23 DIAGNOSIS — H538 Other visual disturbances: Secondary | ICD-10-CM | POA: Diagnosis present

## 2014-05-23 DIAGNOSIS — F17210 Nicotine dependence, cigarettes, uncomplicated: Secondary | ICD-10-CM | POA: Diagnosis present

## 2014-05-23 DIAGNOSIS — R531 Weakness: Secondary | ICD-10-CM

## 2014-05-23 DIAGNOSIS — F1010 Alcohol abuse, uncomplicated: Secondary | ICD-10-CM | POA: Diagnosis present

## 2014-05-23 DIAGNOSIS — N189 Chronic kidney disease, unspecified: Secondary | ICD-10-CM

## 2014-05-23 DIAGNOSIS — I69398 Other sequelae of cerebral infarction: Secondary | ICD-10-CM | POA: Diagnosis not present

## 2014-05-23 DIAGNOSIS — Z6820 Body mass index (BMI) 20.0-20.9, adult: Secondary | ICD-10-CM | POA: Diagnosis not present

## 2014-05-23 DIAGNOSIS — E785 Hyperlipidemia, unspecified: Secondary | ICD-10-CM | POA: Diagnosis present

## 2014-05-23 DIAGNOSIS — Z9119 Patient's noncompliance with other medical treatment and regimen: Secondary | ICD-10-CM | POA: Diagnosis present

## 2014-05-23 DIAGNOSIS — N183 Chronic kidney disease, stage 3 (moderate): Secondary | ICD-10-CM | POA: Diagnosis present

## 2014-05-23 DIAGNOSIS — E440 Moderate protein-calorie malnutrition: Secondary | ICD-10-CM | POA: Diagnosis present

## 2014-05-23 HISTORY — DX: Other general symptoms and signs: R68.89

## 2014-05-23 LAB — COMPREHENSIVE METABOLIC PANEL
ALT: 13 U/L (ref 0–53)
AST: 15 U/L (ref 0–37)
Albumin: 3 g/dL — ABNORMAL LOW (ref 3.5–5.2)
Alkaline Phosphatase: 87 U/L (ref 39–117)
Anion gap: 7 (ref 5–15)
BUN: 47 mg/dL — ABNORMAL HIGH (ref 6–23)
CO2: 25 mmol/L (ref 19–32)
CREATININE: 5.27 mg/dL — AB (ref 0.50–1.35)
Calcium: 8.5 mg/dL (ref 8.4–10.5)
Chloride: 107 mmol/L (ref 96–112)
GFR, EST AFRICAN AMERICAN: 12 mL/min — AB (ref >90)
GFR, EST NON AFRICAN AMERICAN: 10 mL/min — AB (ref >90)
Glucose, Bld: 98 mg/dL (ref 70–99)
Potassium: 4.8 mmol/L (ref 3.5–5.1)
Sodium: 139 mmol/L (ref 135–145)
Total Bilirubin: 0.7 mg/dL (ref 0.3–1.2)
Total Protein: 6.3 g/dL (ref 6.0–8.3)

## 2014-05-23 LAB — CBC WITH DIFFERENTIAL/PLATELET
Basophils Absolute: 0 10*3/uL (ref 0.0–0.1)
Basophils Relative: 0 % (ref 0–1)
EOS ABS: 0.2 10*3/uL (ref 0.0–0.7)
Eosinophils Relative: 4 % (ref 0–5)
HEMATOCRIT: 32.9 % — AB (ref 39.0–52.0)
HEMOGLOBIN: 11.1 g/dL — AB (ref 13.0–17.0)
Lymphocytes Relative: 34 % (ref 12–46)
Lymphs Abs: 1.9 10*3/uL (ref 0.7–4.0)
MCH: 31.4 pg (ref 26.0–34.0)
MCHC: 33.7 g/dL (ref 30.0–36.0)
MCV: 93.2 fL (ref 78.0–100.0)
MONOS PCT: 8 % (ref 3–12)
Monocytes Absolute: 0.4 10*3/uL (ref 0.1–1.0)
Neutro Abs: 3 10*3/uL (ref 1.7–7.7)
Neutrophils Relative %: 54 % (ref 43–77)
Platelets: 140 10*3/uL — ABNORMAL LOW (ref 150–400)
RBC: 3.53 MIL/uL — ABNORMAL LOW (ref 4.22–5.81)
RDW: 14 % (ref 11.5–15.5)
WBC: 5.5 10*3/uL (ref 4.0–10.5)

## 2014-05-23 LAB — URINE MICROSCOPIC-ADD ON

## 2014-05-23 LAB — RAPID URINE DRUG SCREEN, HOSP PERFORMED
AMPHETAMINES: NOT DETECTED
BARBITURATES: NOT DETECTED
Benzodiazepines: NOT DETECTED
Cocaine: NOT DETECTED
Opiates: NOT DETECTED
Tetrahydrocannabinol: NOT DETECTED

## 2014-05-23 LAB — APTT: aPTT: 34 seconds (ref 24–37)

## 2014-05-23 LAB — URINALYSIS, ROUTINE W REFLEX MICROSCOPIC
Bilirubin Urine: NEGATIVE
GLUCOSE, UA: NEGATIVE mg/dL
Ketones, ur: NEGATIVE mg/dL
LEUKOCYTES UA: NEGATIVE
Nitrite: NEGATIVE
PH: 6.5 (ref 5.0–8.0)
Protein, ur: >300 mg/dL — AB
SPECIFIC GRAVITY, URINE: 1.02 (ref 1.005–1.030)
Urobilinogen, UA: 0.2 mg/dL (ref 0.0–1.0)

## 2014-05-23 LAB — TROPONIN I: Troponin I: <0.03 ng/mL (ref <0.031)

## 2014-05-23 LAB — LACTIC ACID, PLASMA: LACTIC ACID, VENOUS: 0.7 mmol/L (ref 0.5–2.0)

## 2014-05-23 LAB — CBG MONITORING, ED: GLUCOSE-CAPILLARY: 105 mg/dL — AB (ref 70–99)

## 2014-05-23 MED ORDER — PANTOPRAZOLE SODIUM 40 MG PO TBEC
40.0000 mg | DELAYED_RELEASE_TABLET | Freq: Every day | ORAL | Status: DC
  Administered 2014-05-24 – 2014-05-25 (×2): 40 mg via ORAL
  Filled 2014-05-23 (×3): qty 1

## 2014-05-23 MED ORDER — SENNOSIDES-DOCUSATE SODIUM 8.6-50 MG PO TABS
1.0000 | ORAL_TABLET | Freq: Two times a day (BID) | ORAL | Status: DC
  Administered 2014-05-24 – 2014-05-26 (×6): 1 via ORAL
  Filled 2014-05-23 (×7): qty 1

## 2014-05-23 MED ORDER — AMLODIPINE BESYLATE 10 MG PO TABS
10.0000 mg | ORAL_TABLET | Freq: Every day | ORAL | Status: DC
  Administered 2014-05-24 – 2014-05-25 (×2): 10 mg via ORAL
  Filled 2014-05-23 (×2): qty 1

## 2014-05-23 MED ORDER — SODIUM CHLORIDE 0.9 % IV BOLUS (SEPSIS)
250.0000 mL | Freq: Once | INTRAVENOUS | Status: AC
  Administered 2014-05-23: 250 mL via INTRAVENOUS

## 2014-05-23 MED ORDER — CITALOPRAM HYDROBROMIDE 10 MG PO TABS
10.0000 mg | ORAL_TABLET | Freq: Every day | ORAL | Status: DC
  Administered 2014-05-24 – 2014-05-25 (×2): 10 mg via ORAL
  Filled 2014-05-23 (×2): qty 1

## 2014-05-23 MED ORDER — MECLIZINE HCL 12.5 MG PO TABS
25.0000 mg | ORAL_TABLET | Freq: Once | ORAL | Status: AC
  Administered 2014-05-23: 25 mg via ORAL
  Filled 2014-05-23: qty 2

## 2014-05-23 MED ORDER — ACETAMINOPHEN 325 MG PO TABS
650.0000 mg | ORAL_TABLET | ORAL | Status: DC | PRN
Start: 2014-05-23 — End: 2014-05-26

## 2014-05-23 MED ORDER — SODIUM CHLORIDE 0.9 % IV SOLN
INTRAVENOUS | Status: DC
  Administered 2014-05-23 – 2014-05-24 (×2): via INTRAVENOUS

## 2014-05-23 MED ORDER — ACETAMINOPHEN 650 MG RE SUPP: 650.0000 mg | RECTAL | Status: DC | PRN

## 2014-05-23 MED ORDER — LABETALOL HCL 5 MG/ML IV SOLN: 10.0000 mg | INTRAVENOUS | Status: DC | PRN

## 2014-05-23 MED ORDER — PANTOPRAZOLE SODIUM 40 MG IV SOLR: 40.0000 mg | Freq: Every day | INTRAVENOUS | Status: DC

## 2014-05-23 MED ORDER — CLONIDINE HCL 0.1 MG PO TABS
0.1000 mg | ORAL_TABLET | Freq: Two times a day (BID) | ORAL | Status: DC
  Administered 2014-05-24 – 2014-05-25 (×4): 0.1 mg via ORAL
  Filled 2014-05-23 (×5): qty 1

## 2014-05-23 MED ORDER — CARVEDILOL 3.125 MG PO TABS
3.1250 mg | ORAL_TABLET | Freq: Two times a day (BID) | ORAL | Status: DC
  Administered 2014-05-24 – 2014-05-25 (×3): 3.125 mg via ORAL
  Filled 2014-05-23 (×5): qty 1

## 2014-05-23 MED ORDER — STROKE: EARLY STAGES OF RECOVERY BOOK
Freq: Once | Status: AC
  Administered 2014-05-23: 22:00:00
  Filled 2014-05-23: qty 1

## 2014-05-23 NOTE — ED Provider Notes (Signed)
CSN: [id number]     Arrival date & time 05/23/14  1422 History   First MD Initiated Contact with Patient 05/23/14 1442     Chief Complaint  Patient presents with  . Dizziness  . Weakness      HPI Pt was seen at 1450. Per pt and his family, c/o sudden onset and resolution of one episode of generalized weakness that occurred approximately 1330 PTA. Pt's family states they were helping him perform his ADL's when he "just slumped over" and became "weak all over." Pt's family states they asked pt what was wrong and he said he "just didn't feel well." Family became concerned due to pt's hx AAA (repaired), so they brought him to the ED for evaluation. Pt's family states he needed heavy 2 person assist to get in/out of his wheelchair due to his generalized weakness. Pt's family states he "looks better now" since arrival to the ED. Pt states he "just felt dizzy." States he "gets dizzy a lot" intermittently for the past several months to year. States his PMD "says it's due to my previous strokes." Pt describes the dizziness as "everything was spinning around," worsens when he changes body positions. Denies abd pain, no N/V/D, no back pain, no CP/SOB, no syncope, no focal motor weakness, no tingling/numbness in extremities, no slurred speech, no facial droop.      Past Medical History  Diagnosis Date  . Hypertension   . COPD (chronic obstructive pulmonary disease)   . Hyperlipidemia   . Cardiomyopathy     EF of 30% per echo in June of 2012; admitted with congestive heart failure; nonobstructive CAD on cath in 08/2010  . History of noncompliance with medical treatment   . Cerebrovascular disease 2011    CVA  in 02/2010-only deficit is decreased vision in  right eye  . Alcohol abuse   . Tobacco abuse     40 pack years  . Abdominal aortic aneurysm     Stent graft repair  . Stroke 2011    decreased vision of right eye  . Chronic systolic CHF (congestive heart failure)   . Leg pain   . CKD (chronic  kidney disease) stage 3, GFR 30-59 ml/min     creatinine-1.7 in 08/2010  . CAD (coronary artery disease)   . Myocardial infarction acute 10/01/11  . CKD (chronic kidney disease), stage III 12/06/2012  . Effusion of right knee joint 12/06/2012  . Anemia   . Atrial fibrillation   . Ataxic gait   . Weakness generalized   . Frequent falls   . Forgetfulness    Past Surgical History  Procedure Laterality Date  . Colonoscopy w/ polypectomy  2006    Dr. Tamala Julian: anal fissure, sessile adenomatous polyp, diverticulosis  . Esophagogastroduodenoscopy  11/15/2010    Procedure: ESOPHAGOGASTRODUODENOSCOPY (EGD);  Surgeon: Dorothyann Peng, MD;  Location: [facility];  Service: Endoscopy;  Laterality: N/A;  with propofol sedation; procedure start @ 0813  . Flexible sigmoidoscopy  11/15/2010    Procedure: FLEXIBLE SIGMOIDOSCOPY;  Surgeon: Dorothyann Peng, MD;  Location: [facility];  Service: Endoscopy;  Laterality: N/A;  with propofol sedation; ended @ 0808  . Polypectomy  12/13/2010    Procedure: POLYPECTOMY;  Surgeon: Dorothyann Peng, MD;  Location: [facility];  Service: Endoscopy;  Laterality: N/A;  right colon, cecal, transverse, and sigmoid polypectomy  . Cardiac catheterization  10/04/11    Normal left main, 95% pLAD stenosis with ulceration s/p successful BMS placement, 30% segmental plaque beyond  this, dLAD after D2 with 50% plaquing, then focal 70% lesion, 80% focal short D2 stenosis, 30% pOM2 lesion, 30-40% mOM3 stenosis, Shephard's crook region of RCA with 50% lesion, mRCA 50-60% lesion and mild dRCA irregularities.   . Coronary stent placement  10/04/11  . Abdominal aortic aneurysm repair w/ endoluminal graft      01/16/2008  . Left heart catheterization with coronary angiogram N/A 10/04/2011    Procedure: LEFT HEART CATHETERIZATION WITH CORONARY ANGIOGRAM;  Surgeon: Hillary Bow, MD;  Location: [facility] CATH [facility];  Service: Cardiovascular;  Laterality: N/A;   Family History  Problem Relation Age of Onset  . Colon  cancer Neg Hx   . Anesthesia problems Neg Hx   . Hypotension Neg Hx   . Malignant hyperthermia Neg Hx   . Pseudochol deficiency Neg Hx   . Cancer Mother     Gall Bladder  . Heart disease Mother     before age 69  . Hyperlipidemia Mother   . Hypertension Mother   . Asthma Mother   . Cancer Sister     Cervical   History  Substance Use Topics  . Smoking status: Light Tobacco Smoker -- 0.50 packs/day for 50 years    Types: Cigarettes    Start date: 06/25/1957  . Smokeless tobacco: Never Used  . Alcohol Use: 16.8 oz/week    28 Shots of liquor per week     Comment: quit about 1 month ago, prior to this would drink about 7 oz of liquor per day 10/2012    Review of Systems ROS: Statement: All systems negative except as marked or noted in the HPI; Constitutional: Negative for fever and chills. ; ; Eyes: Negative for eye pain, redness and discharge. ; ; ENMT: Negative for ear pain, hoarseness, nasal congestion, sinus pressure and sore throat. ; ; Cardiovascular: Negative for chest pain, palpitations, diaphoresis, dyspnea and peripheral edema. ; ; Respiratory: Negative for cough, wheezing and stridor. ; ; Gastrointestinal: Negative for nausea, vomiting, diarrhea, abdominal pain, blood in stool, hematemesis, jaundice and rectal bleeding. . ; ; Genitourinary: Negative for dysuria, flank pain and hematuria. ; ; Musculoskeletal: Negative for back pain and neck pain. Negative for swelling and trauma.; ; Skin: Negative for pruritus, rash, abrasions, blisters, bruising and skin lesion.; ; Neuro: +"weakness," "dizziness." Negative for headache and neck stiffness. Negative for altered level of consciousness , altered mental status, extremity weakness, paresthesias, involuntary movement, seizure and syncope.      Allergies  Review of patient's allergies indicates no known allergies.  Home Medications   Prior to Admission medications   Medication Sig Start Date End Date Taking? Authorizing Provider   amLODipine (NORVASC) 10 MG tablet Take 1 tablet (10 mg total) by mouth daily. 01/16/14  Yes Herminio Commons, MD  aspirin 81 MG tablet Take 81 mg by mouth daily.   Yes Historical Provider, MD  atorvastatin (LIPITOR) 80 MG tablet Take 1 tablet (80 mg total) by mouth daily at 6 PM. 07/11/13  Yes Lendon Colonel, NP  carvedilol (COREG) 3.125 MG tablet Take 1 tablet (3.125 mg total) by mouth 2 (two) times daily. 04/17/14  Yes Herminio Commons, MD  citalopram (CELEXA) 20 MG tablet TAKE 1/2 TABLET BY MOUTH EVERY DAY 04/07/14  Yes Kathyrn Drown, MD  cloNIDine (CATAPRES) 0.1 MG tablet Take 1 tablet (0.1 mg total) by mouth 2 (two) times daily. 05/19/14  Yes Kathyrn Drown, MD  ezetimibe (ZETIA) 10 MG tablet Take 1 tablet (10 mg  total) by mouth daily. 04/17/14  Yes Herminio Commons, MD  Multiple Vitamin (MULTIVITAMIN WITH MINERALS) TABS Take 1 tablet by mouth daily.   Yes Historical Provider, MD  pantoprazole (PROTONIX) 40 MG tablet Take 1 tablet (40 mg total) by mouth daily. 02/19/14  Yes Danie Binder, MD  polyethylene glycol (MIRALAX / GLYCOLAX) packet Take 17 g by mouth daily. 11/29/13  Yes Kathie Dike, MD  VOLTAREN 1 % GEL Apply 4 g topically daily as needed (for knee pain). Bilateral Knee 12/07/13  Yes Historical Provider, MD  acetaminophen (TYLENOL) 325 MG tablet Take 2 tablets (650 mg total) by mouth 3 (three) times daily as needed for pain. Patient taking differently: Take 650 mg by mouth as needed.  12/08/12   Rexene Alberts, MD   BP 142/70 mmHg  Pulse 41  Temp(Src) 97.7 F (36.5 C) (Oral)  Resp 12  SpO2 100%   Filed Vitals:   05/23/14 1730 05/23/14 1813 05/23/14 1830 05/23/14 1857  BP: 148/97 163/77 157/78   Pulse: 46 51 49   Temp:    98.1 F (36.7 C)  TempSrc:      Resp:  15 15   SpO2: 100% 99% 100%     Physical Exam  1455; Physical examination:  Nursing notes reviewed; Vital signs and O2 SAT reviewed;  Constitutional: Well developed, Well nourished, In no acute distress;  Head:  Normocephalic, atraumatic; Eyes: EOMI, PERRL, No scleral icterus; ENMT: Mouth and pharynx normal, Mucous membranes dry; Neck: Supple, Full range of motion, No lymphadenopathy; Cardiovascular: Bradycardic rate and rhythm, No gallop; Respiratory: Breath sounds clear & equal bilaterally, No rales, rhonchi, wheezes.  Speaking full sentences with ease, Normal respiratory effort/excursion; Chest: Nontender, Movement normal; Abdomen: Soft, Nontender, Nondistended, Normal bowel sounds; Genitourinary: No CVA tenderness; Extremities: Pulses normal, No tenderness, No edema, No calf edema or asymmetry.; Neuro: AA&Ox3, vague historian.  Major CN grossly intact. Speech clear.  No facial droop. +right horizontal end gaze fatigable nystagmus. Grips equal. Strength 5/5 equal bilat UE's and LE's.  DTR 2/4 equal bilat UE's and LE's.  No gross sensory deficits.  Normal cerebellar testing bilat UE's (finger-nose) and LE's (heel-shin). .; Skin: Color normal, Warm, Dry.   ED Course  Procedures     EKG Interpretation   Date/Time:  Friday May 23 2014 14:38:29 EST Ventricular Rate:  44 PR Interval:  223 QRS Duration: 102 QT Interval:  522 QTC Calculation: 447 R Axis:   63 Text Interpretation:  Sinus bradycardia with 1st degree A-V block Left  ventricular hypertrophy Nonspecific T abnormalities, inferior leads   Nonspecific ST and T wave abnormality When compared with ECG of 11/25/2013  No significant change was found Confirmed by [facility]  MD, Nunzio Cory  [phone]) on 05/23/2014 3:06:32 PM      MDM  MDM Reviewed: previous chart, nursing note and vitals Reviewed previous: CT scan, ultrasound, labs and ECG Interpretation: labs, ECG, x-ray, ultrasound and MRI Total time providing critical care: 30-74 minutes. This excludes time spent performing separately reportable procedures and services. Consults: neurology   CRITICAL CARE Performed by: Alfonzo Feller Total critical care time: 35 Critical care time  was exclusive of separately billable procedures and treating other patients. Critical care was necessary to treat or prevent imminent or life-threatening deterioration. Critical care was time spent personally by me on the following activities: development of treatment plan with patient and/or surrogate as well as nursing, discussions with consultants, evaluation of patient's response to treatment, examination of patient, obtaining history from patient  or surrogate, ordering and performing treatments and interventions, ordering and review of laboratory studies, ordering and review of radiographic studies, pulse oximetry and re-evaluation of patient's condition.    Results for orders placed or performed during the hospital encounter of 05/23/14  CBC with Differential/Platelet  Result Value Ref Range   WBC 5.5 4.0 - 10.5 K/uL   RBC 3.53 (L) 4.22 - 5.81 MIL/uL   Hemoglobin 11.1 (L) 13.0 - 17.0 g/dL   HCT 32.9 (L) 39.0 - 52.0 %   MCV 93.2 78.0 - 100.0 fL   MCH 31.4 26.0 - 34.0 pg   MCHC 33.7 30.0 - 36.0 g/dL   RDW 14.0 11.5 - 15.5 %   Platelets 140 (L) 150 - 400 K/uL   Neutrophils Relative % 54 43 - 77 %   Neutro Abs 3.0 1.7 - 7.7 K/uL   Lymphocytes Relative 34 12 - 46 %   Lymphs Abs 1.9 0.7 - 4.0 K/uL   Monocytes Relative 8 3 - 12 %   Monocytes Absolute 0.4 0.1 - 1.0 K/uL   Eosinophils Relative 4 0 - 5 %   Eosinophils Absolute 0.2 0.0 - 0.7 K/uL   Basophils Relative 0 0 - 1 %   Basophils Absolute 0.0 0.0 - 0.1 K/uL  Lactic acid, plasma  Result Value Ref Range   Lactic Acid, Venous 0.7 0.5 - 2.0 mmol/L  Troponin I  Result Value Ref Range   Troponin I <0.03 <0.031 ng/mL  Comprehensive metabolic panel  Result Value Ref Range   Sodium 139 135 - 145 mmol/L   Potassium 4.8 3.5 - 5.1 mmol/L   Chloride 107 96 - 112 mmol/L   CO2 25 19 - 32 mmol/L   Glucose, Bld 98 70 - 99 mg/dL   BUN 47 (H) 6 - 23 mg/dL   Creatinine, Ser 5.27 (H) 0.50 - 1.35 mg/dL   Calcium 8.5 8.4 - 10.5 mg/dL   Total  Protein 6.3 6.0 - 8.3 g/dL   Albumin 3.0 (L) 3.5 - 5.2 g/dL   AST 15 0 - 37 U/L   ALT 13 0 - 53 U/L   Alkaline Phosphatase 87 39 - 117 U/L   Total Bilirubin 0.7 0.3 - 1.2 mg/dL   GFR calc non Af Amer 10 (L) >90 mL/min   GFR calc Af Amer 12 (L) >90 mL/min   Anion gap 7 5 - 15  CBG monitoring, ED  Result Value Ref Range   Glucose-Capillary 105 (H) 70 - 99 mg/dL   Dg Chest 2 View 05/23/2014   CLINICAL DATA:  Dizziness.  Weakness.  EXAM: CHEST  2 VIEW  COMPARISON:  11/27/2013.  FINDINGS: Emphysema is present with diffuse hyperinflation. RIGHT pleural apical scarring. No airspace disease. No effusion. Stent graft is present in the upper abdomen. The cardiopericardial silhouette appears within normal limits. Mild tortuosity of the thoracic aorta. RIGHT costophrenic angle excluded from view.  IMPRESSION: Severe emphysema without acute cardiopulmonary disease.   Electronically Signed   By: Dereck Ligas M.D.   On: 05/23/2014 16:36   Ct Head Wo Contrast 05/23/2014   CLINICAL DATA:  Initial evaluation dizziness, loss of coordination for 5 hours, possible punctate hemorrhage posterior limb right internal capsule on MRI performed earlier today for which head CT was recommended  EXAM: CT HEAD WITHOUT CONTRAST  TECHNIQUE: Contiguous axial images were obtained from the base of the skull through the vertex without intravenous contrast.  COMPARISON:  05/23/2014 MRI, 12/05/2012 head CT  FINDINGS: 4 mm focus  of hyperintensity posterior limb internal capsule on the right new from 2014. Severe atrophy and low attenuation in the deep white matter. Numerous areas of chronic lacunar infarction bilaterally, stable. No evidence of mass or vascular territory infarct. Left pontine chronic lacunar infarcts stable. Calvarium intact.  IMPRESSION: 4 mm focus of acute hemorrhage posterior limb internal capsule on the right as suggested by MRI performed earlier today.   Electronically Signed   By: Skipper Cliche M.D.   On: 05/23/2014  18:36   Mr Brain Wo Contrast 05/23/2014   CLINICAL DATA:  New onset of dizziness of weakness beginning earlier today. Prior history of stroke. Subsequent encounter.  EXAM: MRI HEAD WITHOUT CONTRAST  TECHNIQUE: Multiplanar, multiecho pulse sequences of the brain and surrounding structures were obtained without intravenous contrast.  COMPARISON:  Previous MR brain 12/05/2012. Previous CT head 12/05/2012. Previous MRA 03/18/2010.  FINDINGS: Unusual appearing area of susceptibility RIGHT posterior limb internal capsule/RIGHT anterior thalamus on gradient sequence 9 image 12 corresponds with similar area of signal dropout on diffusion series 100, image 26. Slight surrounding apparent diffusion restriction could simply represent susceptibility phenomenon, but acute hemorrhage with ischemia is not excluded. Recommend CT scan without contrast for further evaluation. Possible acute subcentimeter infarct more inferiorly along with corticospinal tract as it becomes the cerebral peduncle best seen on image 33 series 101.  Generalized atrophy. Extensive chronic microvascular ischemic change. Numerous areas of chronic lacunar infarction, some of which are associated with areas of likely chronic hemorrhage, see for instance RIGHT caudate, LEFT thalamus, LEFT genu internal capsule on series 9. Large upper pontine LEFT paramedian chronic lacunar infarct, with chronic RIGHT upper pontine hemorrhage laterally. Numerous small BILATERAL cerebellar infarcts, all chronic.  No pituitary enlargement or tonsillar herniation. Flow voids are maintained in the carotid, basilar, and RIGHT vertebral arteries; LEFT vertebral is diminutive (unchanged from 2011). Upper cervical region incompletely evaluated. Negative orbits. No sinus or mastoid fluid.  Since the prior MRI 12/05/2012, the majority of microbleeds seen today have developed since that time. It is also likely that the majority of the microbleeds are chronic, related to hypertensive  cerebral vascular disease. There is slight worsening of atrophy and chronic microvascular ischemic change since that time.  IMPRESSION: Cannot exclude acute RIGHT posterior limb internal capsule/anterior thalamic hemorrhage, with or without associated acute ischemia. CT scan without contrast recommended. See discussion above.  Possible acute RIGHT internal capsule infarct more inferiorly as seen best on coronal diffusion sequence. Correlate clinically for LEFT-sided weakness.  Advanced atrophy and chronic microvascular ischemic change, with numerous microbleeds which have developed since previous brain MR in 2014, most of which however are likely chronic. Widespread areas of chronic lacunar infarction and BILATERAL cerebellar hemispheric infarction.  Findings discussed with EDP.   Electronically Signed   By: Rolla Flatten M.D.   On: 05/23/2014 17:01   US Abdomen Complete 05/23/2014   CLINICAL DATA:  Dizziness and weakness. History of gallstones and abdominal aortic aneurysm. History of chronic kidney disease.  EXAM: ULTRASOUND ABDOMEN COMPLETE  COMPARISON:  Abdominal ultrasound 11/26/2013  FINDINGS: Gallbladder: Multiple gallstones again noted. No significant gallbladder wall thickening, pericholecystic fluid or sonographic Murphy's sign.  Common bile duct: Diameter: 6.9 mm.  Liver: No focal lesion identified. Within normal limits in parenchymal echogenicity.  IVC: No abnormality visualized.  Pancreas: Largely obscured by bowel gas. No demonstrated abnormality.  Spleen: Size and appearance within normal limits.  Right Kidney: Length: 6.9 cm. There is renal cortical thinning and increased echogenicity. No hydronephrosis.  Left Kidney: Length: 10.0 cm. Mild cortical thinning and increased echogenicity. No hydronephrosis.  Abdominal aorta: Diffuse atherosclerosis with dilatation of the proximal aorta to 3.7 cm, similar to the prior examination.  Other findings: Small right pleural effusion noted.  IMPRESSION: 1.  Cholelithiasis without evidence of cholecystitis. 2. Chronic kidney disease with asymmetric atrophy of the right kidney. 3. No acute findings identified.   Electronically Signed   By: Richardean Sale M.D.   On: 05/23/2014 18:39   Results for GUERRY, COVINGTON (MRN [id number]) as of 05/23/2014 18:48  Ref. Range 11/29/2013 05:41 01/20/2014 11:47 05/19/2014 11:09 05/23/2014 15:20  BUN Latest Range: 6-23 mg/dL 17 34 (H) 40 (H) 47 (H)  Creatinine Latest Range: 0.50-1.35 mg/dL 2.21 (H) 2.95 (H) 4.79 (H) 5.27 (H)    1850:  BUN/Cr mildly elevated from baseline. Pt orthostatic on VS; judicious IVF continues. Antivert given for "dizziness" with improvement. Dx and testing d/w pt and family.  Questions answered.  Verb understanding, agreeable to admit/transfer to [facility].   T/C to [facility] Neuro Dr. Leonel Ramsay, case discussed, including:  HPI, pertinent PM/SHx, VS/PE, dx testing, ED course and treatment:  Agrees with ED workup and in obtaining MRI brain as initial dx test (vs CT-H), agreeable to accept transfer/admit, requests to write temporary orders, obtain Neuro ICU bed to his service.     Francine Graven, DO 05/25/14 2125

## 2014-05-23 NOTE — ED Notes (Signed)
Patient complaining of dizziness. Spouse states patient became dizzy and uncoordinated at approximately 1:30 today. Patient denies pain at this time.

## 2014-05-23 NOTE — H&P (Signed)
Stroke Consult    Chief Complaint: dizziness  HPI: [name] is an 69 y.o. male hx of prior CVA, A fib, HTN, HLD, cardiomyopathy, AAA presenting with acute onset of dizziness and generalized weakness that subsequently resolved. Family notes he "slumped over" and told them he didn't feel well. Wife notes he seemed to be staring off during the episode. She feels he did not recognize her, she notes frequent similar episodes to this. SBP noted to be in the 140s to 150s upon arrival to ED. Head CT and MRI completed shows questionable acute right IC infarct and ICH in right IC. (imaging reviewed). Patient transferred to [facility] for further workup. He is on a daily ASA 81mg  at home.   Date last known well: 05/23/2014 Time last known well: 0900 tPA Given: no, ICH   Past Medical History  Diagnosis Date  . Hypertension   . COPD (chronic obstructive pulmonary disease)   . Hyperlipidemia   . Cardiomyopathy     EF of 30% per echo in June of 2012; admitted with congestive heart failure; nonobstructive CAD on cath in 08/2010  . History of noncompliance with medical treatment   . Cerebrovascular disease 2011    CVA  in 02/2010-only deficit is decreased vision in  right eye  . Alcohol abuse   . Tobacco abuse     40 pack years  . Abdominal aortic aneurysm     Stent graft repair  . Stroke 2011    decreased vision of right eye  . Chronic systolic CHF (congestive heart failure)   . Leg pain   . CKD (chronic kidney disease) stage 3, GFR 30-59 ml/min     creatinine-1.7 in 08/2010  . CAD (coronary artery disease)   . Myocardial infarction acute 10/01/11  . CKD (chronic kidney disease), stage III 12/06/2012  . Effusion of right knee joint 12/06/2012  . Anemia   . Atrial fibrillation   . Ataxic gait   . Weakness generalized   . Frequent falls   . Forgetfulness     Past Surgical History  Procedure Laterality Date  . Colonoscopy w/ polypectomy  2006    Dr. Tamala Julian: anal fissure, sessile adenomatous  polyp, diverticulosis  . Esophagogastroduodenoscopy  11/15/2010    Procedure: ESOPHAGOGASTRODUODENOSCOPY (EGD);  Surgeon: Dorothyann Peng, MD;  Location: [facility];  Service: Endoscopy;  Laterality: N/A;  with propofol sedation; procedure start @ 0813  . Flexible sigmoidoscopy  11/15/2010    Procedure: FLEXIBLE SIGMOIDOSCOPY;  Surgeon: Dorothyann Peng, MD;  Location: [facility];  Service: Endoscopy;  Laterality: N/A;  with propofol sedation; ended @ 0808  . Polypectomy  12/13/2010    Procedure: POLYPECTOMY;  Surgeon: Dorothyann Peng, MD;  Location: [facility];  Service: Endoscopy;  Laterality: N/A;  right colon, cecal, transverse, and sigmoid polypectomy  . Cardiac catheterization  10/04/11    Normal left main, 95% pLAD stenosis with ulceration s/p successful BMS placement, 30% segmental plaque beyond this, dLAD after D2 with 50% plaquing, then focal 70% lesion, 80% focal short D2 stenosis, 30% pOM2 lesion, 30-40% mOM3 stenosis, Shephard's crook region of RCA with 50% lesion, mRCA 50-60% lesion and mild dRCA irregularities.   . Coronary stent placement  10/04/11  . Abdominal aortic aneurysm repair w/ endoluminal graft      01/16/2008  . Left heart catheterization with coronary angiogram N/A 10/04/2011    Procedure: LEFT HEART CATHETERIZATION WITH CORONARY ANGIOGRAM;  Surgeon: Hillary Bow, MD;  Location: [facility] CATH LAB;  Service: Cardiovascular;  Laterality: N/A;    Family History  Problem Relation Age of Onset  . Colon cancer Neg Hx   . Anesthesia problems Neg Hx   . Hypotension Neg Hx   . Malignant hyperthermia Neg Hx   . Pseudochol deficiency Neg Hx   . Cancer Mother     Gall Bladder  . Heart disease Mother     before age 34  . Hyperlipidemia Mother   . Hypertension Mother   . Asthma Mother   . Cancer Sister     Cervical   Social History:  reports that he has been smoking Cigarettes.  He started smoking about 56 years ago. He has a 25 pack-year smoking history. He has never used smokeless tobacco.  He reports that he drinks about 16.8 oz of alcohol per week. He reports that he does not use illicit drugs.  Allergies: No Known Allergies  Medications Prior to Admission  Medication Sig Dispense Refill  . amLODipine (NORVASC) 10 MG tablet Take 1 tablet (10 mg total) by mouth daily. 30 tablet 6  . aspirin 81 MG tablet Take 81 mg by mouth daily.    Marland Kitchen atorvastatin (LIPITOR) 80 MG tablet Take 1 tablet (80 mg total) by mouth daily at 6 PM. 90 tablet 3  . carvedilol (COREG) 3.125 MG tablet Take 1 tablet (3.125 mg total) by mouth 2 (two) times daily. 60 tablet 6  . citalopram (CELEXA) 20 MG tablet TAKE 1/2 TABLET BY MOUTH EVERY DAY 15 tablet 5  . cloNIDine (CATAPRES) 0.1 MG tablet Take 1 tablet (0.1 mg total) by mouth 2 (two) times daily. 60 tablet 3  . ezetimibe (ZETIA) 10 MG tablet Take 1 tablet (10 mg total) by mouth daily. 30 tablet 6  . Multiple Vitamin (MULTIVITAMIN WITH MINERALS) TABS Take 1 tablet by mouth daily.    . pantoprazole (PROTONIX) 40 MG tablet Take 1 tablet (40 mg total) by mouth daily. 30 tablet 11  . polyethylene glycol (MIRALAX / GLYCOLAX) packet Take 17 g by mouth daily. 14 each 0  . VOLTAREN 1 % GEL Apply 4 g topically daily as needed (for knee pain). Bilateral Knee  1  . acetaminophen (TYLENOL) 325 MG tablet Take 2 tablets (650 mg total) by mouth 3 (three) times daily as needed for pain. (Patient taking differently: Take 650 mg by mouth as needed. )      ROS: Out of a complete 14 system review, the patient complains of only the following symptoms, and all other reviewed systems are negative. +fatigue, dizziness  Physical Examination: Filed Vitals:   05/23/14 2047  BP: 147/80  Pulse: 54  Temp: 97.9 F (36.6 C)  Resp: 16   Physical Exam  Constitutional: He appears well-developed and well-nourished.  Psych: Affect appropriate to situation Eyes: No scleral injection HENT: No OP obstrucion Head: Normocephalic.  Cardiovascular: Normal rate and regular rhythm.   Respiratory: Effort normal and breath sounds normal.  GI: Soft. Bowel sounds are normal. No distension. There is no tenderness.  Skin: WDI  Neurologic Examination: Mental Status: Alert, oriented to name, location. Limited verbal output but appears fluent. No dysarthria.  Cranial Nerves: II: unable to visualize fundi, visual fields grossly normal, decreased VA OD III,IV, VI: ptosis not present, extra-ocular motions intact bilaterally, non-sustained horizontal nystagmus in right eye with lateral gaze V,VII: smile symmetric, facial light touch sensation normal bilaterally VIII: hearing normal bilaterally IX,X: gag reflex present XI: trapezius strength/neck flexion strength normal bilaterally XII: tongue strength normal  Motor: Right : Upper extremity    Left:     Upper extremity 5/5 deltoid       5/5 deltoid 5/5 biceps      5/5 biceps  5/5 triceps      5/5 triceps 5/5 hand grip      5/5 hand grip  Lower extremity     Lower extremity 5/5 hip flexor      5/5 hip flexor 5/5 quadricep      5/5 quadriceps  5/5 hamstrings     5/5 hamstrings 5/5 plantar flexion       5/5 plantar flexion 5/5 plantar extension     5/5 plantar extension Tone and bulk:normal tone throughout; no atrophy noted Sensory: Pinprick and light touch intact throughout, bilaterally Deep Tendon Reflexes: 2+ and symmetric throughout Plantars: Right: downgoing   Left: downgoing Cerebellar: normal finger-to-nose, and normal heel-to-shin test Gait: deferred  Laboratory Studies:   Basic Metabolic Panel:  Recent Labs Lab 05/19/14 1109 05/23/14 1520  NA 140 139  K 5.5* 4.8  CL 105 107  CO2 24 25  GLUCOSE 66* 98  BUN 40* 47*  CREATININE 4.79* 5.27*  CALCIUM 9.2 8.5    Liver Function Tests:  Recent Labs Lab 05/23/14 1520  AST 15  ALT 13  ALKPHOS 87  BILITOT 0.7  PROT 6.3  ALBUMIN 3.0*   No results for input(s): LIPASE, AMYLASE in the last 168 hours. No results for input(s): AMMONIA in the last  168 hours.  CBC:  Recent Labs Lab 05/23/14 1520  WBC 5.5  NEUTROABS 3.0  HGB 11.1*  HCT 32.9*  MCV 93.2  PLT 140*    Cardiac Enzymes:  Recent Labs Lab 05/23/14 1520  TROPONINI <0.03    BNP: Invalid input(s): POCBNP  CBG:  Recent Labs Lab 05/23/14 1438  GLUCAP 105*    Microbiology: Results for orders placed or performed during the hospital encounter of 11/25/13  Urine culture     Status: None   Collection Time: 11/25/13 12:17 PM  Result Value Ref Range Status   Specimen Description URINE, CATHETERIZED  Final   Special Requests NONE  Final   Culture  Setup Time   Final    11/25/2013 23:22 Performed at [place] Performed at [facility]  Final   Culture NO GROWTH Performed at [facility]  Final   Report Status 11/27/2013 FINAL  Final  Clostridium Difficile by PCR     Status: None   Collection Time: 11/25/13  9:44 PM  Result Value Ref Range Status   C difficile by pcr NEGATIVE NEGATIVE Final    Coagulation Studies: No results for input(s): LABPROT, INR in the last 72 hours.  Urinalysis: No results for input(s): COLORURINE, LABSPEC, PHURINE, GLUCOSEU, HGBUR, BILIRUBINUR, KETONESUR, PROTEINUR, UROBILINOGEN, NITRITE, LEUKOCYTESUR in the last 168 hours.  Invalid input(s): APPERANCEUR  Lipid Panel:     Component Value Date/Time   CHOL 191 01/20/2014 1147   TRIG 66 01/20/2014 1147   HDL 52 01/20/2014 1147   CHOLHDL 3.7 01/20/2014 1147   VLDL 13 01/20/2014 1147   LDLCALC 126* 01/20/2014 1147    HgbA1C:  Lab Results  Component Value Date   HGBA1C 5.4 10/02/2011    Urine Drug Screen:  No results found for: LABOPIA, COCAINSCRNUR, LABBENZ, AMPHETMU, THCU, LABBARB  Alcohol Level: No results for input(s): ETH in the last 168 hours.  Other results:  Imaging: Dg Chest 2 View  05/23/2014  CLINICAL DATA:  Dizziness.  Weakness.  EXAM: CHEST  2 VIEW  COMPARISON:  11/27/2013.  FINDINGS: Emphysema is  present with diffuse hyperinflation. RIGHT pleural apical scarring. No airspace disease. No effusion. Stent graft is present in the upper abdomen. The cardiopericardial silhouette appears within normal limits. Mild tortuosity of the thoracic aorta. RIGHT costophrenic angle excluded from view.  IMPRESSION: Severe emphysema without acute cardiopulmonary disease.   Electronically Signed   By: Dereck Ligas M.D.   On: 05/23/2014 16:36   Ct Head Wo Contrast  05/23/2014   CLINICAL DATA:  Initial evaluation dizziness, loss of coordination for 5 hours, possible punctate hemorrhage posterior limb right internal capsule on MRI performed earlier today for which head CT was recommended  EXAM: CT HEAD WITHOUT CONTRAST  TECHNIQUE: Contiguous axial images were obtained from the base of the skull through the vertex without intravenous contrast.  COMPARISON:  05/23/2014 MRI, 12/05/2012 head CT  FINDINGS: 4 mm focus of hyperintensity posterior limb internal capsule on the right new from 2014. Severe atrophy and low attenuation in the deep white matter. Numerous areas of chronic lacunar infarction bilaterally, stable. No evidence of mass or vascular territory infarct. Left pontine chronic lacunar infarcts stable. Calvarium intact.  IMPRESSION: 4 mm focus of acute hemorrhage posterior limb internal capsule on the right as suggested by MRI performed earlier today.   Electronically Signed   By: Skipper Cliche M.D.   On: 05/23/2014 18:36   Mr Brain Wo Contrast  05/23/2014   CLINICAL DATA:  New onset of dizziness of weakness beginning earlier today. Prior history of stroke. Subsequent encounter.  EXAM: MRI HEAD WITHOUT CONTRAST  TECHNIQUE: Multiplanar, multiecho pulse sequences of the brain and surrounding structures were obtained without intravenous contrast.  COMPARISON:  Previous MR brain 12/05/2012. Previous CT head 12/05/2012. Previous MRA 03/18/2010.  FINDINGS: Unusual appearing area of susceptibility RIGHT posterior limb  internal capsule/RIGHT anterior thalamus on gradient sequence 9 image 12 corresponds with similar area of signal dropout on diffusion series 100, image 26. Slight surrounding apparent diffusion restriction could simply represent susceptibility phenomenon, but acute hemorrhage with ischemia is not excluded. Recommend CT scan without contrast for further evaluation. Possible acute subcentimeter infarct more inferiorly along with corticospinal tract as it becomes the cerebral peduncle best seen on image 33 series 101.  Generalized atrophy. Extensive chronic microvascular ischemic change. Numerous areas of chronic lacunar infarction, some of which are associated with areas of likely chronic hemorrhage, see for instance RIGHT caudate, LEFT thalamus, LEFT genu internal capsule on series 9. Large upper pontine LEFT paramedian chronic lacunar infarct, with chronic RIGHT upper pontine hemorrhage laterally. Numerous small BILATERAL cerebellar infarcts, all chronic.  No pituitary enlargement or tonsillar herniation. Flow voids are maintained in the carotid, basilar, and RIGHT vertebral arteries; LEFT vertebral is diminutive (unchanged from 2011). Upper cervical region incompletely evaluated. Negative orbits. No sinus or mastoid fluid.  Since the prior MRI 12/05/2012, the majority of microbleeds seen today have developed since that time. It is also likely that the majority of the microbleeds are chronic, related to hypertensive cerebral vascular disease. There is slight worsening of atrophy and chronic microvascular ischemic change since that time.  IMPRESSION: Cannot exclude acute RIGHT posterior limb internal capsule/anterior thalamic hemorrhage, with or without associated acute ischemia. CT scan without contrast recommended. See discussion above.  Possible acute RIGHT internal capsule infarct more inferiorly as seen best on coronal diffusion sequence. Correlate clinically for LEFT-sided weakness.  Advanced atrophy and  chronic microvascular ischemic change,  with numerous microbleeds which have developed since previous brain MR in 2014, most of which however are likely chronic. Widespread areas of chronic lacunar infarction and BILATERAL cerebellar hemispheric infarction.  Findings discussed with EDP.   Electronically Signed   By: Rolla Flatten M.D.   On: 05/23/2014 17:01   US Abdomen Complete  05/23/2014   CLINICAL DATA:  Dizziness and weakness. History of gallstones and abdominal aortic aneurysm. History of chronic kidney disease.  EXAM: ULTRASOUND ABDOMEN COMPLETE  COMPARISON:  Abdominal ultrasound 11/26/2013  FINDINGS: Gallbladder: Multiple gallstones again noted. No significant gallbladder wall thickening, pericholecystic fluid or sonographic Murphy's sign.  Common bile duct: Diameter: 6.9 mm.  Liver: No focal lesion identified. Within normal limits in parenchymal echogenicity.  IVC: No abnormality visualized.  Pancreas: Largely obscured by bowel gas. No demonstrated abnormality.  Spleen: Size and appearance within normal limits.  Right Kidney: Length: 6.9 cm. There is renal cortical thinning and increased echogenicity. No hydronephrosis.  Left Kidney: Length: 10.0 cm. Mild cortical thinning and increased echogenicity. No hydronephrosis.  Abdominal aorta: Diffuse atherosclerosis with dilatation of the proximal aorta to 3.7 cm, similar to the prior examination.  Other findings: Small right pleural effusion noted.  IMPRESSION: 1. Cholelithiasis without evidence of cholecystitis. 2. Chronic kidney disease with asymmetric atrophy of the right kidney. 3. No acute findings identified.   Electronically Signed   By: Richardean Sale M.D.   On: 05/23/2014 18:39    Assessment: 69 y.o. male hx of HTN, HLD, CVA, A fib presenting with acute onset dizziness, generalized weakness. Imaging shows acute basal ganglia ICH, suspect hypertensive etiology.    Plan: 1) Admit to ICU 2)MRI brain completed 3) no antiplatelets or  anticoagulants 4) blood pressure control with goal systolic <161 5) Frequent neuro checks 6) If symptoms worsen or there is decreased mental status, repeat stat head CT 7) PT,OT,ST 8) Echocardiogram 9) Carotid dopplers    Jim Like, DO [facility] [phone]  If 7pm- 7am, please page neurology on call as listed in AMION. 05/23/2014, 9:45 PM

## 2014-05-23 NOTE — Progress Notes (Signed)
Pt's wife notified and verbalized understanding of OV with Dr. Hinda Lenis scheduled for Saturday, March 19 at 12 pm.

## 2014-05-23 NOTE — ED Notes (Signed)
Reported given to [facility] from [place].

## 2014-05-23 NOTE — Progress Notes (Signed)
[facility] 3/4 (OV with Dr. Hinda Lenis is scheduled fot Saturday, March 19 at 12 pm)

## 2014-05-24 DIAGNOSIS — I6789 Other cerebrovascular disease: Secondary | ICD-10-CM

## 2014-05-24 DIAGNOSIS — E440 Moderate protein-calorie malnutrition: Secondary | ICD-10-CM | POA: Insufficient documentation

## 2014-05-24 LAB — BASIC METABOLIC PANEL
Anion gap: 7 (ref 5–15)
BUN: 41 mg/dL — ABNORMAL HIGH (ref 6–23)
CO2: 23 mmol/L (ref 19–32)
Calcium: 8.6 mg/dL (ref 8.4–10.5)
Chloride: 109 mmol/L (ref 96–112)
Creatinine, Ser: 5.02 mg/dL — ABNORMAL HIGH (ref 0.50–1.35)
GFR calc Af Amer: 12 mL/min — ABNORMAL LOW (ref >90)
GFR calc non Af Amer: 11 mL/min — ABNORMAL LOW (ref >90)
GLUCOSE: 99 mg/dL (ref 70–99)
Potassium: 4.5 mmol/L (ref 3.5–5.1)
SODIUM: 139 mmol/L (ref 135–145)

## 2014-05-24 LAB — CBC
HCT: 30.8 % — ABNORMAL LOW (ref 39.0–52.0)
Hemoglobin: 10.5 g/dL — ABNORMAL LOW (ref 13.0–17.0)
MCH: 31.3 pg (ref 26.0–34.0)
MCHC: 34.1 g/dL (ref 30.0–36.0)
MCV: 91.7 fL (ref 78.0–100.0)
Platelets: 126 10*3/uL — ABNORMAL LOW (ref 150–400)
RBC: 3.36 MIL/uL — AB (ref 4.22–5.81)
RDW: 13.9 % (ref 11.5–15.5)
WBC: 6.5 10*3/uL (ref 4.0–10.5)

## 2014-05-24 LAB — MRSA PCR SCREENING: MRSA BY PCR: NEGATIVE

## 2014-05-24 LAB — PROTIME-INR
INR: 1.18 (ref 0.00–1.49)
PROTHROMBIN TIME: 15.1 s (ref 11.6–15.2)

## 2014-05-24 MED ORDER — SODIUM CHLORIDE 0.9 % IV SOLN
INTRAVENOUS | Status: AC
  Administered 2014-05-24: 75 mL via INTRAVENOUS

## 2014-05-24 MED ORDER — ENSURE COMPLETE PO LIQD: 237.0000 mL | Freq: Two times a day (BID) | ORAL | Status: DC

## 2014-05-24 MED ORDER — ENSURE COMPLETE PO LIQD
237.0000 mL | Freq: Every day | ORAL | Status: DC
  Administered 2014-05-24 – 2014-05-25 (×2): 237 mL via ORAL

## 2014-05-24 MED ORDER — ENSURE PUDDING PO PUDG: 1.0000 | Freq: Every day | ORAL | Status: DC

## 2014-05-24 MED ORDER — HYDRALAZINE HCL 20 MG/ML IJ SOLN
5.0000 mg | INTRAMUSCULAR | Status: DC | PRN
  Administered 2014-05-24: 5 mg via INTRAVENOUS
  Filled 2014-05-24: qty 1

## 2014-05-24 NOTE — Progress Notes (Signed)
STROKE TEAM PROGRESS NOTE   HISTORY [name] is a 69 y.o. male hx of prior CVA, A fib, HTN, HLD, cardiomyopathy, CAD, AAA presenting with acute onset of dizziness and generalized weakness that subsequently resolved. Family notes he "slumped over" and told them he didn't feel well. Wife notes he seemed to be staring off during the episode. She feels he did not recognize her, she notes frequent similar episodes to this. SBP noted to be in the 140s to 150s upon arrival to ED. Head CT and MRI completed shows questionable acute right IC infarct and ICH in right IC. (imaging reviewed). Patient transferred to [facility] for further workup. He is on a daily ASA 81mg  at home.   Date last known well: 05/23/2014 Time last known well: 0900 tPA Given: no, ICH    SUBJECTIVE (INTERVAL HISTORY) No family present.Pt without complaints.   OBJECTIVE Temp:  [97.5 F (36.4 C)-98.1 F (36.7 C)] 97.5 F (36.4 C) (03/05 0358) Pulse Rate:  [41-84] 47 (03/05 0600) Cardiac Rhythm:  [-] Heart block (03/04 2300) Resp:  [12-16] 13 (03/05 0600) BP: (98-171)/(58-97) 154/70 mmHg (03/05 0600) SpO2:  [83 %-100 %] 100 % (03/05 0600) Weight:  [75.1 kg (165 lb 9.1 oz)-76.658 kg (169 lb)] 75.1 kg (165 lb 9.1 oz) (03/04 2200)   Recent Labs Lab 05/23/14 1438  GLUCAP 105*    Recent Labs Lab 05/19/14 1109 05/23/14 1520 05/24/14 0227  NA 140 139 139  K 5.5* 4.8 4.5  CL 105 107 109  CO2 24 25 23   GLUCOSE 66* 98 99  BUN 40* 47* 41*  CREATININE 4.79* 5.27* 5.02*  CALCIUM 9.2 8.5 8.6    Recent Labs Lab 05/23/14 1520  AST 15  ALT 13  ALKPHOS 87  BILITOT 0.7  PROT 6.3  ALBUMIN 3.0*    Recent Labs Lab 05/23/14 1520 05/24/14 0227  WBC 5.5 6.5  NEUTROABS 3.0  --   HGB 11.1* 10.5*  HCT 32.9* 30.8*  MCV 93.2 91.7  PLT 140* 126*    Recent Labs Lab 05/23/14 1520  TROPONINI <0.03    Recent Labs  05/24/14 0227  LABPROT 15.1  INR 1.18    Recent Labs  05/23/14 2037  COLORURINE YELLOW   LABSPEC 1.020  PHURINE 6.5  GLUCOSEU NEGATIVE  HGBUR TRACE*  BILIRUBINUR NEGATIVE  KETONESUR NEGATIVE  PROTEINUR >300*  UROBILINOGEN 0.2  NITRITE NEGATIVE  LEUKOCYTESUR NEGATIVE       Component Value Date/Time   CHOL 191 01/20/2014 1147   TRIG 66 01/20/2014 1147   HDL 52 01/20/2014 1147   CHOLHDL 3.7 01/20/2014 1147   VLDL 13 01/20/2014 1147   LDLCALC 126* 01/20/2014 1147   Lab Results  Component Value Date   HGBA1C 5.4 10/02/2011      Component Value Date/Time   LABOPIA NONE DETECTED 05/23/2014 2037   COCAINSCRNUR NONE DETECTED 05/23/2014 2037   LABBENZ NONE DETECTED 05/23/2014 2037   AMPHETMU NONE DETECTED 05/23/2014 2037   THCU NONE DETECTED 05/23/2014 2037   LABBARB NONE DETECTED 05/23/2014 2037    No results for input(s): ETH in the last 168 hours.  Dg Chest 2 View 05/23/2014    Severe emphysema without acute cardiopulmonary disease.       Ct Head Wo Contrast 05/23/2014    4 mm focus of acute hemorrhage posterior limb internal capsule on the right as suggested by MRI performed earlier today.       Mr Brain Wo Contrast  05/23/2014    Cannot exclude  acute RIGHT posterior limb internal capsule/anterior thalamic hemorrhage, with or without associated acute ischemia. CT scan without contrast recommended. See discussion above.  Possible acute RIGHT internal capsule infarct more inferiorly as seen best on coronal diffusion sequence. Correlate clinically for LEFT-sided weakness.  Advanced atrophy and chronic microvascular ischemic change, with numerous microbleeds which have developed since previous brain MR in 2014, most of which however are likely chronic. Widespread areas of chronic lacunar infarction and BILATERAL cerebellar hemispheric infarction.      US Abdomen Complete 05/23/2014    1. Cholelithiasis without evidence of cholecystitis.  2. Chronic kidney disease with asymmetric atrophy of the right kidney.  3. No acute findings identified.      PHYSICAL  EXAM Frail malnourished looking elderly african male not in distress. Has dystrophic nails. . Afebrile. Head is nontraumatic. Neck is supple without bruit.    Cardiac exam no murmur or gallop. Lungs are clear to auscultation. Distal pulses are well felt. Neurological Exam ;  Awake  Alert oriented x 3. Normal speech and language.eye movements full without nystagmus.fundi were not visualized. Vision acuity and fields appear normal. Hearing is normal. Palatal movements are normal. Face symmetric. Tongue midline. Normal strength, tone, reflexes and coordination. Mildly diminished fine finger movements on the left. Orbits right over left upper extremity. Normal sensation. Gait deferred.     ASSESSMENT/PLAN [name] is a 69 y.o. male with history of prior CVA, A fib, HTN, HLD, cardiomyopathy, CAD, AAA presenting with dizziness and generalized weakness. He did not receive IV t-PA due to [place].   Stroke:  Non-dominant right caudate hemorrhagic infarct versus small hypertensive hemorrhage  Resultant    MRI  Cannot exclude acute RIGHT posterior limb internal capsule/anterior thalamic hemorrhage. Possible  acute RIGHT internal capsule infarct.  MRA - Not performed  Carotid Doppler - pending  2D Echo - pending  LDL 126  HgbA1c pending  SCDs for VTE prophylaxis  Diet Heart with thin liquids  aspirin 81 mg orally every day prior to admission, now on no antithrombotic secondary to bleed.  Ongoing aggressive stroke risk factor management  Therapy recommendations:  Pending  Disposition: Pending  Hypertension  Home meds: Norvasc, Catapres, Coreg  Mildly high at times    Hyperlipidemia  Home meds:  Lipitor 80 mg daily. Not resumed in hospital  LDL 126, goal < 70  Restart Lipitor when appropriate  Continue statin at discharge   Other Stroke Risk Factors  Advanced age  Cigarette smoker, advised to stop smoking  ETOH use  Hx stroke/TIA  Coronary artery  disease   Other Active Problems  Anemia -  H/H  10.5/30.8  Chronic kidney disease stage 3 - BUN/Creat   41/5.02   Other Pertinent History  History of medical noncompliance.  Etoh abuse  Frequent falls   Hospital day # Theodore PA-C [facility] Pager [phone] 05/24/2014, 8:15 AM  I have personally examined this patient, reviewed notes, independently viewed imaging studies, participated in medical decision making and plan of care. I have made any additions or clarifications directly to the above note. Agree with note above.  I think he has had a small hemorrhagic right basal ganglia infarct from atrial fibrillation. He remains at risk for hematoma expansion, neurological worsening, he needs strict blood pressure control and close neurological monitoring. Will repeat CT scan of the head in the morning. He may need to be started on aspirin once the blood clears. This patient is critically  ill and at significant risk of neurological worsening, death and care requires constant monitoring of vital signs, hemodynamics,respiratory and cardiac monitoring,review of multiple databases, neurological assessment, discussion with family, other specialists and medical decision making of high complexity.I have made any additions or clarifications directly to the above note.  I spent 30 minutes of neurocritical care time  in the care of  this patient.   Antony Contras, MD Medical Director [facility] Pager: [phone] 05/24/2014 12:04 PM    To contact Stroke Continuity provider, please refer to [email or web address] After hours, contact General Neurology

## 2014-05-24 NOTE — Evaluation (Signed)
Physical Therapy Evaluation Patient Details Name: [name] MRN: [id number] DOB: Mar 27, 1945 Today's Date: 05/24/2014   History of Present Illness  [name] is an 69 y.o. male hx of prior CVA, A fib, HTN, HLD, cardiomyopathy, AAA presenting with acute onset of dizziness and generalized weakness that subsequently resolved. Family notes he "slumped over" and told them he didn't feel well. Wife notes he seemed to be staring off during the episode. She feels he did not recognize her, she notes frequent similar episodes to this. SBP noted to be in the 140s to 150s upon arrival to ED. Head CT and MRI completed shows questionable acute right IC infarct and ICH in right IC.  Clinical Impression  Pt admitted with above diagnosis. Pt currently with functional limitations due to the deficits listed below (see PT Problem List). Mobility currently limited by orthostasis which caused an episode similar to what his wife had been seeing at home (see BP values below).  Pt will benefit from skilled PT to increase their independence and safety with mobility to allow discharge to the venue listed below.       Follow Up Recommendations Home health PT;Supervision/Assistance - 24 hour    Equipment Recommendations  None recommended by PT    Recommendations for Other Services       Precautions / Restrictions Precautions Precautions: Fall Precaution Comments: orthostatic Restrictions Weight Bearing Restrictions: No      Mobility  Bed Mobility Overal bed mobility: Needs Assistance Bed Mobility: Supine to Sit     Supine to sit: Supervision        Transfers Overall transfer level: Needs assistance Equipment used: Rolling walker (2 wheeled) Transfers: Sit to/from [facility] Sit to Stand: Min assist Stand pivot transfers: Min assist       General transfer comment: min A to steady, pt denied dizziness upon initial standing even though BP dropped to 90/44. Though the  second time he stood, BP dropped further and pt was symptomatic. Min A for pivot to chair, pt moved well with RW  Ambulation/Gait             General Gait Details: NT due to pt being orthostatic, kept him directly in front of bed and chair  Stairs            Wheelchair Mobility    Modified Rankin (Stroke Patients Only) Modified Rankin (Stroke Patients Only) Pre-Morbid Rankin Score: Moderately severe disability Modified Rankin: Moderately severe disability     Balance Overall balance assessment: Needs assistance Sitting-balance support: No upper extremity supported;Feet supported Sitting balance-Leahy Scale: Good Sitting balance - Comments: pt able to sit EOB without support and accept mild challenges   Standing balance support: Bilateral upper extremity supported;During functional activity Standing balance-Leahy Scale: Poor Standing balance comment: requires UE support with standing, partly due to BP issues                             Pertinent Vitals/Pain Pain Assessment: No/denies pain  BP in supine: 167/70     Sitting at 1 min: 132/72 (pt asymptomatic)   HR 56 BPM            At 3 mins: 129/78 (pt asymptomatic) Standing at 1 min: 90/44 (pt aysmptomatic) Second stand: 73/47 (pt symptomatic, returned to reclined in chair) Reclined in chair: 159/ 68      HR 43 bpm    Home Living Family/patient expects to be discharged  to:: Private residence Living Arrangements: Spouse/significant other Available Help at Discharge: Family;Available 24 hours/day Type of Home: House Home Access: Stairs to enter   [employer] of Steps: 1 off the patio Home Layout: One level Home Equipment: Environmental consultant - 2 wheels;Shower seat;Cane - quad;Wheelchair - manual Additional Comments: high rise toilet seat with handles.    Prior Function Level of Independence: Needs assistance   Gait / Transfers Assistance Needed: ambulates with walker, though pt reports that he mostly  stays in bed because he doesn't feel like getting up  ADL's / Homemaking Assistance Needed: patient is able to dress himself although he lacks the motivation to do so. Wife baths him.   Comments: pt was able to walk out to car for appts     Hand Dominance   Dominant Hand: Right    Extremity/Trunk Assessment   Upper Extremity Assessment: Defer to OT evaluation           Lower Extremity Assessment: Generalized weakness      Cervical / Trunk Assessment: Normal  Communication   Communication: No difficulties  Cognition Arousal/Alertness: Awake/alert Behavior During Therapy: WFL for tasks assessed/performed;Flat affect Overall Cognitive Status: Impaired/Different from baseline Area of Impairment: Orientation               General Comments: pt in tact at beginning of session but when BP dropped, pt became lethargic, not responding verbally, once seated pt returned to self, RN present    General Comments General comments (skin integrity, edema, etc.): pt with knee OA causing stiffness. Discussed strategies to decrease effects of orthostasis    Exercises General Exercises - Lower Extremity Ankle Circles/Pumps: AROM;Both;20 reps;Supine (before getting up and in chair) Quad Sets: AROM;Both;10 reps;Seated Gluteal Sets: AROM;Both;10 reps;Seated Long Arc Quad: AROM;Both;5 reps;Seated Heel Slides: AROM;Both;10 reps;Seated Straight Leg Raises: AROM;Both;5 reps;Supine Hip Flexion/Marching: AROM;Both;10 reps;Standing Heel Raises: AROM;Both;10 reps;Standing      Assessment/Plan    PT Assessment Patient needs continued PT services  PT Diagnosis Difficulty walking;Generalized weakness   PT Problem List Decreased strength;Decreased activity tolerance;Decreased balance;Decreased mobility;Decreased knowledge of precautions  PT Treatment Interventions DME instruction;Gait training;Stair training;Functional mobility training;Therapeutic activities;Therapeutic exercise;Balance  training;Neuromuscular re-education;Cognitive remediation;Patient/family education   PT Goals (Current goals can be found in the Care Plan section) Acute Rehab PT Goals Patient Stated Goal: return home PT Goal Formulation: With patient Time For Goal Achievement: 06/07/14 Potential to Achieve Goals: Good    Frequency Min 4X/week   Barriers to discharge        Co-evaluation               End of Session Equipment Utilized During Treatment: Gait belt Activity Tolerance: Other (comment) (limited by orthostasis) Patient left: in chair;with call bell/phone within reach;with family/visitor present;with nursing/sitter in room Nurse Communication: Mobility status;Other (comment) (BP)         Time: 9381-0175 PT Time Calculation (min) (ACUTE ONLY): 46 min   Charges:   PT Evaluation $Initial PT Evaluation Tier I: 1 Procedure PT Treatments $Therapeutic Exercise: 8-22 mins $Therapeutic Activity: 8-22 mins   PT G Codes:      Leighton Roach, PT  Acute Rehab Services  [place], [place] 05/24/2014, 2:13 PM

## 2014-05-24 NOTE — Progress Notes (Signed)
Unable to complete cognitive linguistic evaluation today. Will f/u 3/6.  [place], [place] [phone]

## 2014-05-24 NOTE — Progress Notes (Signed)
OT Cancellation Note  Patient Details Name: [name] MRN: [id number] DOB: 1945/04/20   Cancelled Treatment:    Reason Eval/Treat Not Completed: Other (comment). Pt just got back to bed with nursing. Will re-attempt eval at another time.  Almon Register [phone] 05/24/2014, 3:01 PM

## 2014-05-24 NOTE — Progress Notes (Addendum)
INITIAL NUTRITION ASSESSMENT Pt meets criteria for Moderate MALNUTRITION in the context of Chronic illness as evidenced by mild muscle and fat wasting. DOCUMENTATION CODES Per approved criteria  -Non-severe (moderate) malnutrition in the context of chronic illness   INTERVENTION: -Ensure Complete po Daily, each supplement provides 350 kcal and 13 grams of protein  -Ensure Pudding po Daily, each supplement provides 170 kcal and 4 grams of protein  -Snacks per pt request   NUTRITION DIAGNOSIS: Inadequate oral intake related to poor intake as evidenced by mild muscle/fat wasting  Goal: Pt to meet >/= 90% of their estimated nutrition needs   Monitor:  Oral intake, snack/supp preferences, weight, labs  Reason for Assessment: MST of 3  69 y.o. male  Admitting Dx: <principal problem not specified>  ASSESSMENT:  70 y.o. male hx of prior CVA, A fib, HTN, HLD, cardiomyopathy, AAA presenting with acute onset of dizziness and generalized weakness that subsequently resolved. transferred to [facility] for further workup  Pt says he has had a reduced appetite for sometime, unsure why, though he reports it improved on admission. At home he drinks 1 ensure a day and takes an MVI.    He said he used to way ~242 3 years ago and gradually lost weight to where he is now. He said his weight before this admission was ~168 lbs. He said frequent admissions have caused him to lose all the weight. He says he notices he specificly has lost weight on his belly and legs.   He denies N/V or C/D  Nutrition Focused Physical Exam:  Subcutaneous Fat:  Orbital Region: n/a Upper Arm Region: mild Thoracic and Lumbar Region: mild (per pt)  Muscle:  Temple Region: mild Clavicle Bone Region: mild Clavicle and Acromion Bone Region: mild Scapular Bone Region: n/a Dorsal Hand: mild Patellar Region: mild Anterior Thigh Region:mild (per pt) Posterior Calf Region: mild  Edema: none    Height: Ht Readings from  Last 1 Encounters:  05/23/14 6\' 3"  (1.905 m)    Weight: Wt Readings from Last 1 Encounters:  05/23/14 165 lb 9.1 oz (75.1 kg)    Ideal Body Weight: 196 lbs  % Ideal Body Weight: 84%  Wt Readings from Last 10 Encounters:  05/23/14 165 lb 9.1 oz (75.1 kg)  05/23/14 169 lb (76.658 kg)  05/19/14 169 lb (76.658 kg)  05/13/14 168 lb (76.204 kg)  04/17/14 160 lb (72.576 kg)  02/19/14 168 lb 9.6 oz (76.476 kg)  01/20/14 166 lb (75.297 kg)  01/16/14 133 lb (60.328 kg)  12/19/13 168 lb (76.204 kg)  11/25/13 164 lb 12.5 oz (74.744 kg)    Usual Body Weight: ~168  % Usual Body Weight: 98%  BMI:  Body mass index is 20.69 kg/(m^2).  Estimated Nutritional Needs: Kcal: 8099-8338 Protein: >113 g Fluid: per md  Skin: Dry  Diet Order: Diet Heart  EDUCATION NEEDS: -No education needs identified at this time   Intake/Output Summary (Last 24 hours) at 05/24/14 0835 Last data filed at 05/24/14 0700  Gross per 24 hour  Intake    675 ml  Output    780 ml  Net   -105 ml    Last BM: 3/4   Labs:   Recent Labs Lab 05/19/14 1109 05/23/14 1520 05/24/14 0227  NA 140 139 139  K 5.5* 4.8 4.5  CL 105 107 109  CO2 24 25 23   BUN 40* 47* 41*  CREATININE 4.79* 5.27* 5.02*  CALCIUM 9.2 8.5 8.6  GLUCOSE 66* 98 99  CBG (last 3)   Recent Labs  05/23/14 1438  GLUCAP 105*    Scheduled Meds: . amLODipine  10 mg Oral Daily  . carvedilol  3.125 mg Oral BID WC  . citalopram  10 mg Oral Daily  . cloNIDine  0.1 mg Oral BID  . feeding supplement (ENSURE COMPLETE)  237 mL Oral BID BM  . pantoprazole  40 mg Oral Daily  . senna-docusate  1 tablet Oral BID    Continuous Infusions: . sodium chloride 75 mL/hr at 05/24/14 0250    Past Medical History  Diagnosis Date  . Hypertension   . COPD (chronic obstructive pulmonary disease)   . Hyperlipidemia   . Cardiomyopathy     EF of 30% per echo in June of 2012; admitted with congestive heart failure; nonobstructive CAD on cath  in 08/2010  . History of noncompliance with medical treatment   . Cerebrovascular disease 2011    CVA  in 02/2010-only deficit is decreased vision in  right eye  . Alcohol abuse   . Tobacco abuse     40 pack years  . Abdominal aortic aneurysm     Stent graft repair  . Stroke 2011    decreased vision of right eye  . Chronic systolic CHF (congestive heart failure)   . Leg pain   . CKD (chronic kidney disease) stage 3, GFR 30-59 ml/min     creatinine-1.7 in 08/2010  . CAD (coronary artery disease)   . Myocardial infarction acute 10/01/11  . CKD (chronic kidney disease), stage III 12/06/2012  . Effusion of right knee joint 12/06/2012  . Anemia   . Atrial fibrillation   . Ataxic gait   . Weakness generalized   . Frequent falls   . Forgetfulness     Past Surgical History  Procedure Laterality Date  . Colonoscopy w/ polypectomy  2006    Dr. Tamala Julian: anal fissure, sessile adenomatous polyp, diverticulosis  . Esophagogastroduodenoscopy  11/15/2010    Procedure: ESOPHAGOGASTRODUODENOSCOPY (EGD);  Surgeon: Dorothyann Peng, MD;  Location: [facility];  Service: Endoscopy;  Laterality: N/A;  with propofol sedation; procedure start @ 0813  . Flexible sigmoidoscopy  11/15/2010    Procedure: FLEXIBLE SIGMOIDOSCOPY;  Surgeon: Dorothyann Peng, MD;  Location: [facility];  Service: Endoscopy;  Laterality: N/A;  with propofol sedation; ended @ 0808  . Polypectomy  12/13/2010    Procedure: POLYPECTOMY;  Surgeon: Dorothyann Peng, MD;  Location: [facility];  Service: Endoscopy;  Laterality: N/A;  right colon, cecal, transverse, and sigmoid polypectomy  . Cardiac catheterization  10/04/11    Normal left main, 95% pLAD stenosis with ulceration s/p successful BMS placement, 30% segmental plaque beyond this, dLAD after D2 with 50% plaquing, then focal 70% lesion, 80% focal short D2 stenosis, 30% pOM2 lesion, 30-40% mOM3 stenosis, Shephard's crook region of RCA with 50% lesion, mRCA 50-60% lesion and mild dRCA irregularities.   .  Coronary stent placement  10/04/11  . Abdominal aortic aneurysm repair w/ endoluminal graft      01/16/2008  . Left heart catheterization with coronary angiogram N/A 10/04/2011    Procedure: LEFT HEART CATHETERIZATION WITH CORONARY ANGIOGRAM;  Surgeon: Hillary Bow, MD;  Location: [facility] CATH LAB;  Service: Cardiovascular;  Laterality: N/A;    Burtis Junes RD, LDN Nutrition Pager: [phone] 05/24/2014 8:35 AM

## 2014-05-24 NOTE — Progress Notes (Signed)
VASCULAR LAB PRELIMINARY  PRELIMINARY  PRELIMINARY  PRELIMINARY  Carotid Dopplers completed.    Preliminary report:  1-39% ICA stenosis.  Vertebral artery flow is antegrade.  Dyer Klug, RVT 05/24/2014, 4:11 PM

## 2014-05-24 NOTE — Progress Notes (Signed)
  Echocardiogram 2D Echocardiogram has been performed.  Diamond Nickel 05/24/2014, 10:55 AM

## 2014-05-25 ENCOUNTER — Inpatient Hospital Stay (HOSPITAL_COMMUNITY): Payer: Medicare Other

## 2014-05-25 DIAGNOSIS — R42 Dizziness and giddiness: Secondary | ICD-10-CM | POA: Insufficient documentation

## 2014-05-25 MED ORDER — CITALOPRAM HYDROBROMIDE 10 MG PO TABS
10.0000 mg | ORAL_TABLET | Freq: Every day | ORAL | Status: DC
  Administered 2014-05-25 – 2014-05-26 (×2): 10 mg via ORAL
  Filled 2014-05-25 (×2): qty 1

## 2014-05-25 MED ORDER — POLYETHYLENE GLYCOL 3350 17 G PO PACK
17.0000 g | PACK | Freq: Every day | ORAL | Status: DC
  Filled 2014-05-25: qty 1

## 2014-05-25 MED ORDER — ATORVASTATIN CALCIUM 80 MG PO TABS
80.0000 mg | ORAL_TABLET | Freq: Every day | ORAL | Status: DC
  Administered 2014-05-25: 80 mg via ORAL
  Filled 2014-05-25: qty 1

## 2014-05-25 MED ORDER — CLONIDINE HCL 0.1 MG PO TABS
0.1000 mg | ORAL_TABLET | Freq: Two times a day (BID) | ORAL | Status: DC
  Administered 2014-05-25 – 2014-05-26 (×2): 0.1 mg via ORAL
  Filled 2014-05-25 (×2): qty 1

## 2014-05-25 MED ORDER — PANTOPRAZOLE SODIUM 40 MG PO TBEC
40.0000 mg | DELAYED_RELEASE_TABLET | Freq: Every day | ORAL | Status: DC
  Administered 2014-05-25 – 2014-05-26 (×2): 40 mg via ORAL
  Filled 2014-05-25 (×2): qty 1

## 2014-05-25 MED ORDER — CARVEDILOL 3.125 MG PO TABS
3.1250 mg | ORAL_TABLET | Freq: Two times a day (BID) | ORAL | Status: DC
  Administered 2014-05-25: 3.125 mg via ORAL
  Filled 2014-05-25 (×3): qty 1

## 2014-05-25 MED ORDER — AMLODIPINE BESYLATE 10 MG PO TABS
10.0000 mg | ORAL_TABLET | Freq: Every day | ORAL | Status: DC
  Administered 2014-05-25 – 2014-05-26 (×2): 10 mg via ORAL
  Filled 2014-05-25 (×2): qty 1

## 2014-05-25 MED ORDER — ADULT MULTIVITAMIN W/MINERALS CH
1.0000 | ORAL_TABLET | Freq: Every day | ORAL | Status: DC
  Administered 2014-05-26: 1 via ORAL
  Filled 2014-05-25: qty 1

## 2014-05-25 MED ORDER — EZETIMIBE 10 MG PO TABS
10.0000 mg | ORAL_TABLET | Freq: Every day | ORAL | Status: DC
  Administered 2014-05-25 – 2014-05-26 (×2): 10 mg via ORAL
  Filled 2014-05-25 (×2): qty 1

## 2014-05-25 NOTE — Progress Notes (Signed)
STROKE TEAM PROGRESS NOTE   HISTORY [name] is a 69 y.o. male hx of prior CVA, A fib, HTN, HLD, cardiomyopathy, CAD, AAA presenting with acute onset of dizziness and generalized weakness that subsequently resolved. Family notes he "slumped over" and told them he didn't feel well. Wife notes he seemed to be staring off during the episode. She feels he did not recognize her, she notes frequent similar episodes to this. SBP noted to be in the 140s to 150s upon arrival to ED. Head CT and MRI completed shows questionable acute right IC infarct and ICH in right IC. (imaging reviewed). Patient transferred to [facility] for further workup. He is on a daily ASA 81mg  at home.   Date last known well: 05/23/2014 Time last known well: 0900 tPA Given: no, ICH    SUBJECTIVE (INTERVAL HISTORY) Wife present at bedside.Pt without complaints. Repeat CT scan shows minimum decrease in hemorrhage size from 4 mm to 3 mm. Blood pressure adequately controlled.   OBJECTIVE Temp:  [97.4 F (36.3 C)-98.5 F (36.9 C)] 97.6 F (36.4 C) (03/06 0343) Pulse Rate:  [50-66] 61 (03/06 0819) Cardiac Rhythm:  [-] Heart block (03/06 0800) Resp:  [13-18] 18 (03/06 0800) BP: (124-152)/(64-77) 130/70 mmHg (03/06 1018) SpO2:  [97 %-100 %] 99 % (03/06 0800)   Recent Labs Lab 05/23/14 1438  GLUCAP 105*    Recent Labs Lab 05/19/14 1109 05/23/14 1520 05/24/14 0227  NA 140 139 139  K 5.5* 4.8 4.5  CL 105 107 109  CO2 24 25 23   GLUCOSE 66* 98 99  BUN 40* 47* 41*  CREATININE 4.79* 5.27* 5.02*  CALCIUM 9.2 8.5 8.6    Recent Labs Lab 05/23/14 1520  AST 15  ALT 13  ALKPHOS 87  BILITOT 0.7  PROT 6.3  ALBUMIN 3.0*    Recent Labs Lab 05/23/14 1520 05/24/14 0227  WBC 5.5 6.5  NEUTROABS 3.0  --   HGB 11.1* 10.5*  HCT 32.9* 30.8*  MCV 93.2 91.7  PLT 140* 126*    Recent Labs Lab 05/23/14 1520  TROPONINI <0.03    Recent Labs  05/24/14 0227  LABPROT 15.1  INR 1.18    Recent Labs   05/23/14 2037  COLORURINE YELLOW  LABSPEC 1.020  PHURINE 6.5  GLUCOSEU NEGATIVE  HGBUR TRACE*  BILIRUBINUR NEGATIVE  KETONESUR NEGATIVE  PROTEINUR >300*  UROBILINOGEN 0.2  NITRITE NEGATIVE  LEUKOCYTESUR NEGATIVE       Component Value Date/Time   CHOL 191 01/20/2014 1147   TRIG 66 01/20/2014 1147   HDL 52 01/20/2014 1147   CHOLHDL 3.7 01/20/2014 1147   VLDL 13 01/20/2014 1147   LDLCALC 126* 01/20/2014 1147   Lab Results  Component Value Date   HGBA1C 5.4 10/02/2011      Component Value Date/Time   LABOPIA NONE DETECTED 05/23/2014 2037   COCAINSCRNUR NONE DETECTED 05/23/2014 2037   LABBENZ NONE DETECTED 05/23/2014 2037   AMPHETMU NONE DETECTED 05/23/2014 2037   THCU NONE DETECTED 05/23/2014 2037   LABBARB NONE DETECTED 05/23/2014 2037    No results for input(s): ETH in the last 168 hours.  Dg Chest 2 View 05/23/2014    Severe emphysema without acute cardiopulmonary disease.       Ct Head Wo Contrast 05/23/2014    4 mm focus of acute hemorrhage posterior limb internal capsule on the right as suggested by MRI performed earlier today.       Mr Brain Wo Contrast  05/23/2014    Cannot  exclude acute RIGHT posterior limb internal capsule/anterior thalamic hemorrhage, with or without associated acute ischemia. CT scan without contrast recommended. See discussion above.  Possible acute RIGHT internal capsule infarct more inferiorly as seen best on coronal diffusion sequence. Correlate clinically for LEFT-sided weakness.  Advanced atrophy and chronic microvascular ischemic change, with numerous microbleeds which have developed since previous brain MR in 2014, most of which however are likely chronic. Widespread areas of chronic lacunar infarction and BILATERAL cerebellar hemispheric infarction.      US Abdomen Complete 05/23/2014    1. Cholelithiasis without evidence of cholecystitis.  2. Chronic kidney disease with asymmetric atrophy of the right kidney.  3. No acute findings  identified.      PHYSICAL EXAM Frail malnourished looking elderly african male not in distress. Has dystrophic nails. . Afebrile. Head is nontraumatic. Neck is supple without bruit.    Cardiac exam no murmur or gallop. Lungs are clear to auscultation. Distal pulses are well felt. Neurological Exam ;  Awake  Alert oriented x 3. Normal speech and language.eye movements full without nystagmus.fundi were not visualized. Vision acuity and fields appear normal. Hearing is normal. Palatal movements are normal. Face symmetric. Tongue midline. Normal strength, tone, reflexes and coordination. Mildly diminished fine finger movements on the left. Orbits right over left upper extremity. Normal sensation. Gait deferred.     ASSESSMENT/PLAN [name] is a 69 y.o. male with history of prior CVA, A fib, HTN, HLD, cardiomyopathy, CAD, AAA presenting with dizziness and generalized weakness. He did not receive IV t-PA due to [place].   Stroke:  Non-dominant right caudate hemorrhagic infarct versus small hypertensive hemorrhage  Resultant    MRI  Cannot exclude acute RIGHT posterior limb internal capsule/anterior thalamic hemorrhage. Possible  acute RIGHT internal capsule infarct.  MRA - Not performed  Carotid Doppler - pending  2D Echo - pending  LDL 126  HgbA1c pending  SCDs for VTE prophylaxis  Diet Heart with thin liquids  aspirin 81 mg orally every day prior to admission, now on no antithrombotic secondary to bleed.  Ongoing aggressive stroke risk factor management  Therapy recommendations:  Pending  Disposition: Pending  Hypertension  Home meds: Norvasc, Catapres, Coreg  Mildly high at times    Hyperlipidemia  Home meds:  Lipitor 80 mg daily. Not resumed in hospital  LDL 126, goal < 70  Restart Lipitor when appropriate  Continue statin at discharge   Other Stroke Risk Factors  Advanced age  Cigarette smoker, advised to stop smoking  ETOH use  Hx  stroke/TIA  Coronary artery disease   Other Active Problems  Anemia -  H/H  10.5/30.8  Chronic kidney disease stage 3 - BUN/Creat   41/5.02   Other Pertinent History  History of medical noncompliance.  Etoh abuse  Frequent falls   Hospital day # 2   Mikey Bussing PA-C [facility] Pager [phone] 05/25/2014, 11:31 AM  I have personally examined this patient, reviewed notes, independently viewed imaging studies, participated in medical decision making and plan of care. I have made any additions or clarifications directly to the above note. Agree with note above.  I think he has had a small hemorrhagic right basal ganglia infarct from atrial fibrillation. He remains at risk for hematoma expansion, neurological worsening, he needs strict blood pressure control and close neurological monitoring.  Discussed with patient and wife and answered questions. Plan to mobilize him out of bed, transfer  to the neurology unit floor bed. Therapy  consults   Antony Contras, MD Medical Director [place] Pager: [phone] 05/25/2014 11:31 AM    To contact Stroke Continuity provider, please refer to [email or web address] After hours, contact General Neurology

## 2014-05-25 NOTE — Progress Notes (Signed)
Received patient from ICU via wheelchair; oriented to room and unit routine; wife at bedside; patient is alert and oriented; denies pain.  Instructed on fall safety; bed alarm set.

## 2014-05-26 ENCOUNTER — Other Ambulatory Visit: Payer: Self-pay | Admitting: *Deleted

## 2014-05-26 ENCOUNTER — Telehealth: Payer: Self-pay | Admitting: *Deleted

## 2014-05-26 DIAGNOSIS — E440 Moderate protein-calorie malnutrition: Secondary | ICD-10-CM

## 2014-05-26 LAB — URINE CULTURE: Colony Count: 40000

## 2014-05-26 MED ORDER — ENSURE COMPLETE PO LIQD: 237.0000 mL | Freq: Every day | ORAL | Status: DC

## 2014-05-26 NOTE — Progress Notes (Signed)
Physical Therapy Treatment Patient Details Name: [name] MRN: [id number] DOB: [birth year] Today's Date: 05/26/2014    History of Present Illness [name] is an 68 y.o. male hx of prior CVA, A fib, HTN, HLD, cardiomyopathy, AAA presenting with acute onset of dizziness and generalized weakness that subsequently resolved. Family notes he "slumped over" and told them he didn't feel well. Wife notes he seemed to be staring off during the episode. She feels he did not recognize her, she notes frequent similar episodes to this. SBP noted to be in the 140s to 150s upon arrival to ED. Head CT and MRI completed shows questionable acute right IC infarct and ICH in right IC.    PT Comments    Patient able to progress this session. All orthostatics taken were WNL and not showing a drop in BP from sit to stand. Patient in recliner prior to session therefore did not get BP from lying to sitting. Patient with no complaints of dizziness with ambulation.   Follow Up Recommendations  Home health PT;Supervision/Assistance - 24 hour     Equipment Recommendations  None recommended by PT    Recommendations for Other Services       Precautions / Restrictions Precautions Precautions: Fall Precaution Comments: orthostatic    Mobility  Bed Mobility                  Transfers Overall transfer level: Needs assistance Equipment used: Rolling walker (2 wheeled) Transfers: Sit to/from [facility] Sit to Stand: Min assist Stand pivot transfers: Min assist       General transfer comment: min A to steady   Ambulation/Gait Ambulation/Gait assistance: Min assist Ambulation Distance (Feet): 180 Feet Assistive device: Rolling walker (2 wheeled) Gait Pattern/deviations: Step-through pattern;Decreased stride length     General Gait Details: Cued for safe use of RW and cues for positioning and posture within RW. No complaints of dizziness or lightheadedness.    Stairs            Wheelchair Mobility    Modified Rankin (Stroke Patients Only) Modified Rankin (Stroke Patients Only) Pre-Morbid Rankin Score: Moderately severe disability Modified Rankin: Moderately severe disability     Balance Overall balance assessment: Needs assistance Sitting-balance support: Feet supported Sitting balance-Leahy Scale: Good     Standing balance support: Bilateral upper extremity supported Standing balance-Leahy Scale: Poor Standing balance comment: requires UE support                     Cognition Arousal/Alertness: Awake/alert Behavior During Therapy: WFL for tasks assessed/performed Overall Cognitive Status: Within Functional Limits for tasks assessed                 General Comments: cognition intact for basic eval, but pt is very guarded with responses to questions making it difficult to accurately assess     Exercises      General Comments General comments (skin integrity, edema, etc.): sister's present during eval       Pertinent Vitals/Pain Pain Assessment: No/denies pain    Home Living Family/patient expects to be discharged to:: Private residence Living Arrangements: Spouse/significant other Available Help at Discharge: Family;Available 24 hours/day Type of Home: House Home Access: Stairs to enter   Home Layout: One level Home Equipment: Environmental consultant - 2 wheels;Shower seat;Cane - quad;Wheelchair - manual Additional Comments: Pt takes sponge baths; high rise toilet seat with handles.    Prior Function Level of Independence: Needs assistance  Gait / Transfers  Assistance Needed: ambulates with walker, though pt reports that he mostly stays in bed because he doesn't feel like getting up ADL's / Homemaking Assistance Needed: patient is able to dress himself although he lacks the motivation to do so. Wife baths him.Marland Kitchen He takes sponge baths.  He reports that wife performs all IADLs and he does not drive.  He reports last fall was in the  fall  Comments: pt was able to walk out to car for appts   PT Goals (current goals can now be found in the care plan section) Acute Rehab PT Goals Patient Stated Goal: to go home  Progress towards PT goals: Progressing toward goals    Frequency  Min 4X/week    PT Plan Current plan remains appropriate    Co-evaluation             End of Session Equipment Utilized During Treatment: Gait belt Activity Tolerance: Patient tolerated treatment well Patient left: in chair;with call bell/phone within reach;with chair alarm set     Time: 0910-0929 PT Time Calculation (min) (ACUTE ONLY): 19 min  Charges:  $Gait Training: 8-22 mins                    G Codes:      Jacqualyn Posey 05/26/2014, 12:53 PM 05/26/2014 Jacqualyn Posey PTA [phone] pager [phone] office

## 2014-05-26 NOTE — Care Management Note (Signed)
    Page 1 of 1   05/26/2014     1:52:42 PM CARE MANAGEMENT NOTE 05/26/2014  Patient:  BACH, ROCCHI A   Account Number:  [id number]  Date Initiated:  05/26/2014  Documentation initiated by:  Saori Umholtz  Subjective/Objective Assessment:   Patient was admitted with [place]. Lives at home with spouse.     Action/Plan:   Will follow for discharge needs pending PT/OT evals and physician orders.   Anticipated DC Date:  05/26/2014   Anticipated DC Plan:  HOME/SELF CARE      DC Planning Services  CM consult  OP Neuro Rehab      Choice offered to / List presented to:  C-1 Patient           Status of service:  Completed, signed off Medicare Important Message given?  YES (If response is "NO", the following Medicare IM given date fields will be blank) Date Medicare IM given:  05/26/2014 Medicare IM given by:  Lorne Skeens Date Additional Medicare IM given:   Additional Medicare IM given by:    Discharge Disposition:    Per UR Regulation:    If discussed at Long Length of Stay Meetings, dates discussed:    Comments:  05/26/14 [place] RN, MSN, CM- Met with patient to discuss discharge needs.  Patient is agreeable to outpatient neuro rehab and has requested [facility]. This is close to his home and he has been a patient there in the past.  Information was faxed to [facility]

## 2014-05-26 NOTE — Telephone Encounter (Signed)
See form in nurse's basket for citalopram 20mg . Need to clarify with pt if he is taking 20mg  or 10mg . Pt is currently admitted in the hospital.

## 2014-05-26 NOTE — Progress Notes (Signed)
UR complete.  Nacole Fluhr RN, MSN 

## 2014-05-26 NOTE — Evaluation (Signed)
Speech Language Pathology Evaluation Patient Details Name: [name] MRN: [id number] DOB: 1945-08-03 Today's Date: 05/26/2014 Time: 1040-1100 SLP Time Calculation (min) (ACUTE ONLY): 20 min  Problem List:  Patient Active Problem List   Diagnosis Date Noted  . Dizziness   . Malnutrition of moderate degree 05/24/2014  . ICH (intracerebral hemorrhage) 05/23/2014  . HTN (hypertension) 05/19/2014  . Diarrhea 11/25/2013  . Fecal impaction with overflow incontinence of loose stool unlikely infectious 11/25/2013  . Acute kidney injury 11/25/2013  . Aftercare following surgery of the circulatory system, [place] 04/05/2013  . Anemia 12/08/2012  . Protein-calorie malnutrition, severe 12/06/2012  . CKD (chronic kidney disease), stage III 12/06/2012  . Thrombocytopenia, unspecified 12/06/2012  . Fall at home 12/06/2012  . Acute bronchitis 12/06/2012  . UTI (urinary tract infection) 12/06/2012  . Effusion of right knee joint 12/06/2012  . Altered mental status 12/05/2012  . Vascular dementia 12/05/2012  . CAD (coronary artery disease) 10/07/2011  . Chronic systolic CHF (congestive heart failure) 10/07/2011  . Acute respiratory disease 10/03/2011  . Non-STEMI (non-ST elevated myocardial infarction) 10/02/2011  . Hypokalemia 10/02/2011  . Abdominal aneurysm without mention of rupture 04/04/2011  . History of noncompliance with medical treatment   . Cerebrovascular disease   . Adenomatous polyp 10/19/2010  . Hypertensive heart disease without CHF   . Hyperlipidemia   . Chronic kidney disease   . Alcohol abuse   . Tobacco abuse   . Chronic obstructive pulmonary disease 02/09/2010  . History of  abdominal aortic aneurysm (stent graft)    Past Medical History:  Past Medical History  Diagnosis Date  . Hypertension   . COPD (chronic obstructive pulmonary disease)   . Hyperlipidemia   . Cardiomyopathy     EF of 30% per echo in June of 2012; admitted with congestive heart failure;  nonobstructive CAD on cath in 08/2010  . History of noncompliance with medical treatment   . Cerebrovascular disease 2011    CVA  in 02/2010-only deficit is decreased vision in  right eye  . Alcohol abuse   . Tobacco abuse     40 pack years  . Abdominal aortic aneurysm     Stent graft repair  . Stroke 2011    decreased vision of right eye  . Chronic systolic CHF (congestive heart failure)   . Leg pain   . CKD (chronic kidney disease) stage 3, GFR 30-59 ml/min     creatinine-1.7 in 08/2010  . CAD (coronary artery disease)   . Myocardial infarction acute 10/01/11  . CKD (chronic kidney disease), stage III 12/06/2012  . Effusion of right knee joint 12/06/2012  . Anemia   . Atrial fibrillation   . Ataxic gait   . Weakness generalized   . Frequent falls   . Forgetfulness    Past Surgical History:  Past Surgical History  Procedure Laterality Date  . Colonoscopy w/ polypectomy  2006    Dr. Tamala Julian: anal fissure, sessile adenomatous polyp, diverticulosis  . Esophagogastroduodenoscopy  11/15/2010    Procedure: ESOPHAGOGASTRODUODENOSCOPY (EGD);  Surgeon: Dorothyann Peng, MD;  Location: [facility];  Service: Endoscopy;  Laterality: N/A;  with propofol sedation; procedure start @ 0813  . Flexible sigmoidoscopy  11/15/2010    Procedure: FLEXIBLE SIGMOIDOSCOPY;  Surgeon: Dorothyann Peng, MD;  Location: [facility];  Service: Endoscopy;  Laterality: N/A;  with propofol sedation; ended @ 0808  . Polypectomy  12/13/2010    Procedure: POLYPECTOMY;  Surgeon: Dorothyann Peng, MD;  Location: [facility]  [facility];  Service: Endoscopy;  Laterality: N/A;  right colon, cecal, transverse, and sigmoid polypectomy  . Cardiac catheterization  10/04/11    Normal left main, 95% pLAD stenosis with ulceration s/p successful BMS placement, 30% segmental plaque beyond this, dLAD after D2 with 50% plaquing, then focal 70% lesion, 80% focal short D2 stenosis, 30% pOM2 lesion, 30-40% mOM3 stenosis, Shephard's crook region of RCA with 50% lesion, mRCA  50-60% lesion and mild dRCA irregularities.   . Coronary stent placement  10/04/11  . Abdominal aortic aneurysm repair w/ endoluminal graft      01/16/2008  . Left heart catheterization with coronary angiogram N/A 10/04/2011    Procedure: LEFT HEART CATHETERIZATION WITH CORONARY ANGIOGRAM;  Surgeon: Hillary Bow, MD;  Location: [facility] CATH LAB;  Service: Cardiovascular;  Laterality: N/A;   HPI:  69 y.o. male hx of prior CVA (2011, only deficit is visual impairment), ETOH abuse, MI , forgetfulnessA fib, HTN, HLD, COPD,cardiomyopathy, AAA presenting with acute onset of dizziness and generalized weakness that subsequently resolved. MRI shows acute right IC infarct and ICH in right IC. CXR severe emphysema without acute cardiopulmonary disease.   Assessment / Plan / Recommendation Clinical Impression  Wife present during assessment and reports pt "is in bed most of the day except for bowel movement or MD appointment." He does not initiate ADL's (including eat/drinking and wife states she has given him his meds then purposefully waited to determine if he would initiate getting po's or ask wife. Wife reports he did not initiate on his own. Pt unable to state why he is inacitve/passive when asked. Wife manages/assists with all ADL's. Pt oriented to all aspects except for situation, able to follow 3 step commands, recalled info heard from [name] 20 minutes prior. Educated pt on memory strategies.It appears that cognitive impairments in addition to baseline "stubborness" (per wife) are limiting factors with pt's performance. No ST recommended at present. If he becomes more active and involved in his care, he may benefit from continued therapy at that time.      SLP Assessment  Patient does not need any further Speech Lanaguage Pathology Services    Follow Up Recommendations  None    Frequency and Duration        Pertinent Vitals/Pain Pain Assessment: No/denies pain   SLP Goals     SLP  Evaluation Prior Functioning  Cognitive/Linguistic Baseline: Baseline deficits Baseline deficit details:  (memory, initiation) Type of Home: House  Lives With: Spouse Available Help at Discharge: Family;Available 24 hours/day   Cognition  Overall Cognitive Status: Within Functional Limits for tasks assessed Arousal/Alertness: Awake/alert Orientation Level: Oriented to person;Oriented to place;Oriented to time;Oriented to situation Attention: Sustained Sustained Attention: Appears intact Memory: Impaired Memory Impairment: Decreased short term memory Decreased Short Term Memory: Verbal basic Awareness: Impaired Awareness Impairment: Intellectual impairment;Emergent impairment;Anticipatory impairment Problem Solving: Impaired Problem Solving Impairment: Verbal basic Safety/Judgment: Appears intact    Comprehension  Auditory Comprehension Overall Auditory Comprehension: Appears within functional limits for tasks assessed Commands: Within Functional Limits (3 step functional) Visual Recognition/Discrimination Discrimination: Not tested Reading Comprehension Reading Status:  (visual disturbances, baseline)    Expression Expression Primary Mode of Expression: Verbal Verbal Expression Overall Verbal Expression: Appears within functional limits for tasks assessed Written Expression Dominant Hand: Right Written Expression: Not tested   Oral / Motor Oral Motor/Sensory Function Overall Oral Motor/Sensory Function: Appears within functional limits for tasks assessed Motor Speech Overall Motor Speech: Appears within functional limits for tasks assessed Intelligibility: Intelligible Motor  Planning: Witnin functional limits   GO     Houston Siren 05/26/2014, 1:15 PM  Orbie Pyo Colvin Caroli.Ed [employer] [phone]

## 2014-05-26 NOTE — Evaluation (Signed)
Occupational Therapy Evaluation Patient Details Name: [name] MRN: [id number] DOB: 04-Oct-1945 Today's Date: 05/26/2014    History of Present Illness [name] is an 69 y.o. male hx of prior CVA, A fib, HTN, HLD, cardiomyopathy, AAA presenting with acute onset of dizziness and generalized weakness that subsequently resolved. Family notes he "slumped over" and told them he didn't feel well. Wife notes he seemed to be staring off during the episode. She feels he did not recognize her, she notes frequent similar episodes to this. SBP noted to be in the 140s to 150s upon arrival to ED. Head CT and MRI completed shows questionable acute right IC infarct and ICH in right IC.   Clinical Impression   Pt admitted with above. He demonstrates the below listed deficits and will benefit from continued OT to maximize safety and independence with BADLs.  Pt presents to OT with generalized weakness, impaired balance, and impaired vision (per pt report, vision worse over the past month).  He did have asymptomatic orthostatic hypotension.  BP sitting 140/68; standing 120/68.   He is able to perform BADLs with min guard assist to min A, but reports wife assists him with almost all of his ADLs at home - unsure as to why.  Anticipate he is close to his baseline level of functioning.  Will see acutely.         Follow Up Recommendations  No OT follow up;Supervision/Assistance - 24 hour    Equipment Recommendations  None recommended by OT    Recommendations for Other Services       Precautions / Restrictions Precautions Precautions: Fall Precaution Comments: orthostatic      Mobility Bed Mobility                  Transfers Overall transfer level: Needs assistance   Transfers: Sit to/from Stand;Stand Pivot Transfers Sit to Stand: Min guard Stand pivot transfers: Min assist       General transfer comment: min A to steady     Balance Overall balance assessment: Needs  assistance Sitting-balance support: Feet supported Sitting balance-Leahy Scale: Good     Standing balance support: Bilateral upper extremity supported Standing balance-Leahy Scale: Poor Standing balance comment: requires UE support                             ADL Overall ADL's : Needs assistance/impaired Eating/Feeding: Independent;Sitting   Grooming: Wash/dry hands;Wash/dry face;Applying deodorant;Brushing hair;Minimal assistance;Standing   Upper Body Bathing: Set up;Sitting   Lower Body Bathing: Minimal assistance;Sit to/from stand Lower Body Bathing Details (indicate cue type and reason): min a for thoroughness  Upper Body Dressing : Supervision/safety;Sitting   Lower Body Dressing: Min guard;Sit to/from stand   Toilet Transfer: Ambulation;BSC;Comfort height toilet;Minimal assistance   Toileting- Water quality scientist and Hygiene: Min guard;Sit to/from stand       Functional mobility during ADLs: Minimal assistance;Rolling walker General ADL Comments: Pt requires min A for balance.  BP sitting 140/68 sitting; standing 120/68; standing after 3 mins 121/72.  Pt denies dizziness      Vision Vision Assessment?: Yes Eye Alignment: Impaired (comment) Ocular Range of Motion: Within Functional Limits Alignment/Gaze Preference: Chin down;Head turned;Gaze right Tracking/Visual Pursuits: Able to track stimulus in all quads without difficulty Visual Fields: No apparent deficits Additional Comments: Pt reports he has a history of "his eyes not lining up from previous CVA.   He denies diplopia, but demonstrates dysgonjugate gaze and keeps  head rotated to lt. and chin tilted down.   He reports he feels vision has worsened over the past month.  He reportedly "sees things that aren't there" the objects are not consistently the same, but he consistently sees them on his left.   EOMs full, but dysconjugate;  Visual fields WFL to controntation testing    Perception     Praxis       Pertinent Vitals/Pain Pain Assessment: 0-10     Hand Dominance Right   Extremity/Trunk Assessment Upper Extremity Assessment Upper Extremity Assessment: Overall WFL for tasks assessed   Lower Extremity Assessment Lower Extremity Assessment: Defer to PT evaluation   Cervical / Trunk Assessment Cervical / Trunk Assessment: Normal   Communication Communication Communication: No difficulties   Cognition Arousal/Alertness: Awake/alert Behavior During Therapy: WFL for tasks assessed/performed;Flat affect Overall Cognitive Status: Difficult to assess                 General Comments: cognition intact for basic eval, but pt is very guarded with responses to questions making it difficult to accurately assess    General Comments       Exercises       Shoulder Instructions      Home Living Family/patient expects to be discharged to:: Private residence Living Arrangements: Spouse/significant other Available Help at Discharge: Family;Available 24 hours/day Type of Home: House Home Access: Stairs to enter [employer] of Steps: 1 off the patio   Home Layout: One level     Bathroom Shower/Tub: Teacher, early years/pre: Standard     Home Equipment: Environmental consultant - 2 wheels;Shower seat;Cane - quad;Wheelchair - manual   Additional Comments: Pt takes sponge baths; high rise toilet seat with handles.  Lives With: Spouse    Prior Functioning/Environment Level of Independence: Needs assistance  Gait / Transfers Assistance Needed: ambulates with walker, though pt reports that he mostly stays in bed because he doesn't feel like getting up ADL's / Homemaking Assistance Needed: patient is able to dress himself although he lacks the motivation to do so. Wife baths him.Marland Kitchen He takes sponge baths.  He reports that wife performs all IADLs and he does not drive.  He reports last fall was in the fall    Comments: pt was able to walk out to car for appts    OT  Diagnosis: Generalized weakness;Disturbance of vision   OT Problem List: Decreased strength;Decreased activity tolerance;Impaired balance (sitting and/or standing);Impaired vision/perception;Decreased safety awareness   OT Treatment/Interventions: Self-care/ADL training;DME and/or AE instruction;Therapeutic activities;Visual/perceptual remediation/compensation;Patient/family education;Balance training    OT Goals(Current goals can be found in the care plan section) Acute Rehab OT Goals Patient Stated Goal: to go home  OT Goal Formulation: With patient Time For Goal Achievement: 06/09/14 Potential to Achieve Goals: Good ADL Goals Pt Will Perform Grooming: with supervision;standing Pt Will Transfer to Toilet: with supervision;ambulating;regular height toilet;bedside commode;grab bars Pt Will Perform Toileting - Clothing Manipulation and hygiene: with supervision;sit to/from stand  OT Frequency: Min 2X/week   Barriers to D/C:            Co-evaluation              End of Session Equipment Utilized During Treatment: Surveyor, mining Communication: Mobility status  Activity Tolerance: Patient tolerated treatment well Patient left: in chair;with call bell/phone within reach;with chair alarm set;with family/visitor present   Time: 5621-3086 OT Time Calculation (min): 21 min Charges:  OT General Charges $OT Visit: 1 Procedure OT Evaluation $Initial OT  Evaluation Tier I: 1 Procedure G-Codes:    Lucille Passy M 06-25-14, 12:32 PM

## 2014-05-26 NOTE — Progress Notes (Signed)
Discharge instruction reviewed with patient/family. All questions answered at this time. Transport by family.    Terrace Fontanilla, RN 

## 2014-05-26 NOTE — Discharge Summary (Addendum)
Physician Discharge Summary  Patient ID: [name] MRN: [id number] DOB/AGE: 1945-12-10 69 y.o.  Admit date: 05/23/2014 Discharge date: 05/26/2014  Admission Diagnoses: Dizziness  Discharge Diagnoses: Non-dominant right caudate  small hypertensive hemorrhage Active Problems:   ICH (intracerebral hemorrhage)   Malnutrition of moderate degree   Dizziness Accelerated hypertension Malnutrition  Discharged Condition: fair  Hospital Course:  [name] is a 69 y.o. male hx of prior CVA, A fib, HTN, HLD, cardiomyopathy, CAD, AAA presenting with acute onset of dizziness and generalized weakness that subsequently resolved. Family notes he "slumped over" and told them he didn't feel well. Wife notes he seemed to be staring off during the episode. She feels he did not recognize her, she notes frequent similar episodes to this. SBP noted to be in the 140s to 150s upon arrival to ED. Head CT and MRI completed shows questionable acute right IC infarct and ICH in right IC. (imaging reviewed). Patient transferred to [facility] for further workup.  Date last known well: 05/23/2014 Time last known well: 0900 tPA Given: no, ICH  He was on a daily ASA 81mg  at home which was discontinued. His blood pressure was tightly controlled. He was in the intensive care unit for 2 days and remained neurologically stable. He had no focal deficits but had mild gait imbalance. He was seen by physical occupational therapy and considered stable for discharge home. He was advised to be compliant with his hypertensive medications and keep follow-up with primary care physician. He was advised to follow-up in the stroke clinic in 2 months  Consults: None  Significant Diagnostic Studies: see above      Discharge Exam: Blood pressure 128/65, pulse 53, temperature 97.8 F (36.6 C), temperature source Oral, resp. rate 18, height 6\' 3"  (1.905 m), weight 165 lb 9.1 oz (75.1 kg), SpO2 98 %.  PHYSICAL EXAM Frail malnourished  looking elderly african male not in distress. Has dystrophic nails. . Afebrile. Head is nontraumatic. Neck is supple without bruit. Cardiac exam no murmur or gallop. Lungs are clear to auscultation. Distal pulses are well felt. Neurological Exam ;  Awake Alert oriented x 3. Normal speech and language.eye movements full without nystagmus.fundi were not visualized. Vision acuity and fields appear normal. Hearing is normal. Palatal movements are normal. Face symmetric. Tongue midline. Normal strength, tone, reflexes and coordination. Mildly diminished fine finger movements on the left. Orbits right over left upper extremity. Normal sensation. Gait deferred. Disposition: 06-Home-Health Care Svc  Discharge Instructions    Ambulatory referral to Neurology    Complete by:  As directed   Stroke office f/u in 1 month            Medication List    STOP taking these medications        acetaminophen 325 MG tablet  Commonly known as:  TYLENOL     aspirin 81 MG tablet      TAKE these medications        amLODipine 10 MG tablet  Commonly known as:  NORVASC  Take 1 tablet (10 mg total) by mouth daily.     atorvastatin 80 MG tablet  Commonly known as:  LIPITOR  Take 1 tablet (80 mg total) by mouth daily at 6 PM.     carvedilol 3.125 MG tablet  Commonly known as:  COREG  Take 1 tablet (3.125 mg total) by mouth 2 (two) times daily.     citalopram 20 MG tablet  Commonly known as:  CELEXA  TAKE 1/2  TABLET BY MOUTH EVERY DAY     cloNIDine 0.1 MG tablet  Commonly known as:  CATAPRES  Take 1 tablet (0.1 mg total) by mouth 2 (two) times daily.     ezetimibe 10 MG tablet  Commonly known as:  ZETIA  Take 1 tablet (10 mg total) by mouth daily.     feeding supplement (ENSURE COMPLETE) Liqd  Take 237 mLs by mouth daily at 12 noon.     multivitamin with minerals Tabs tablet  Take 1 tablet by mouth daily.     pantoprazole 40 MG tablet  Commonly known as:  PROTONIX  Take 1 tablet (40 mg  total) by mouth daily.     polyethylene glycol packet  Commonly known as:  MIRALAX / GLYCOLAX  Take 17 g by mouth daily.     VOLTAREN 1 % Gel  Generic drug:  diclofenac sodium  Apply 4 g topically daily as needed (for knee pain). Bilateral Knee           Follow-up Information    Follow up with Greg Tracz, MD In 2 months.   Specialties:  Neurology, Radiology   Why:  stroke clinic, office will call you for follow up appointment, call earlier if symptoms worsen or new neurologica   Contact information:   [place] [address] [phone]       Follow up with Greg Lange, MD In 1 month.   Specialty:  Family Medicine   Contact information:   [place] [address] [phone]      Time spent on discharge summary 35 minutes Signed: [name] 05/26/2014, 1:47 PM

## 2014-05-27 ENCOUNTER — Other Ambulatory Visit: Payer: Self-pay | Admitting: *Deleted

## 2014-05-27 MED ORDER — CITALOPRAM HYDROBROMIDE 10 MG PO TABS: 10.0000 mg | ORAL_TABLET | Freq: Every day | ORAL | Status: DC

## 2014-05-27 NOTE — Telephone Encounter (Signed)
Patient currently in hospital and is receiving celexa 10mg  daily.

## 2014-05-28 ENCOUNTER — Ambulatory Visit (HOSPITAL_COMMUNITY): Payer: Medicare Other | Attending: Neurology | Admitting: Physical Therapy

## 2014-05-28 ENCOUNTER — Encounter (HOSPITAL_COMMUNITY): Payer: Self-pay | Admitting: Physical Therapy

## 2014-05-28 DIAGNOSIS — R6889 Other general symptoms and signs: Secondary | ICD-10-CM | POA: Diagnosis not present

## 2014-05-28 DIAGNOSIS — R269 Unspecified abnormalities of gait and mobility: Secondary | ICD-10-CM

## 2014-05-28 DIAGNOSIS — I639 Cerebral infarction, unspecified: Secondary | ICD-10-CM

## 2014-05-28 DIAGNOSIS — Z7409 Other reduced mobility: Secondary | ICD-10-CM

## 2014-05-28 DIAGNOSIS — I69398 Other sequelae of cerebral infarction: Secondary | ICD-10-CM | POA: Insufficient documentation

## 2014-05-28 NOTE — Patient Instructions (Signed)
ABDUCTION: Standing (Active)   Stand, feet flat. Lift right leg out to side. Use ___ lbs. Complete ___ sets of ___ repetitions. Perform ___ sessions per day.  http://gtsc.exer.us/110   Copyright  VHI. All rights reserved.  Weight Shift: Stepping Forward and Back   Weight on left leg, step backward and transfer weight onto right leg, lifting toes of left foot. Then step forward and transfer weight onto right leg, lifting heel of left foot. Repeat ___ times each way. Then stand on right leg and move left leg.  Copyright  VHI. All rights reserved.  FUNCTIONAL MOBILITY: Marching - Standing   March in place by lifting left leg up, then right. Alternate. ___ reps per set, ___ sets per day, ___ days per week Hold onto a support.  Copyright  VHI. All rights reserved.

## 2014-05-28 NOTE — Therapy (Signed)
[place], [place], [address] Phone: [phone]   Fax:  [phone]  Physical Therapy Evaluation  Patient Details  Name: [name] MRN: [id number] Date of Birth: 1945-07-08 Referring Provider:  Garvin Fila, MD  Encounter Date: 05/28/2014      PT End of Session - 05/28/14 1356    Visit Number 1   Number of Visits 16   Date for PT Re-Evaluation 06/25/14   Authorization Type BCBS Medicare   Authorization Time Period 05/28/14 to 07/28/14   Authorization - Visit Number 1   Authorization - Number of Visits 10   PT Start Time 6222   PT Stop Time 1348   PT Time Calculation (min) 45 min   Activity Tolerance Patient tolerated treatment well;Patient limited by fatigue   Behavior During Therapy [facility] for tasks assessed/performed      Past Medical History  Diagnosis Date  . Hypertension   . COPD (chronic obstructive pulmonary disease)   . Hyperlipidemia   . Cardiomyopathy     EF of 30% per echo in June of 2012; admitted with congestive heart failure; nonobstructive CAD on cath in 08/2010  . History of noncompliance with medical treatment   . Cerebrovascular disease 2011    CVA  in 02/2010-only deficit is decreased vision in  right eye  . Alcohol abuse   . Tobacco abuse     40 pack years  . Abdominal aortic aneurysm     Stent graft repair  . Stroke 2011    decreased vision of right eye  . Chronic systolic CHF (congestive heart failure)   . Leg pain   . CKD (chronic kidney disease) stage 3, GFR 30-59 ml/min     creatinine-1.7 in 08/2010  . CAD (coronary artery disease)   . Myocardial infarction acute 10/01/11  . CKD (chronic kidney disease), stage III 12/06/2012  . Effusion of right knee joint 12/06/2012  . Anemia   . Atrial fibrillation   . Ataxic gait   . Weakness generalized   . Frequent falls   . Forgetfulness     Past Surgical History  Procedure Laterality Date  . Colonoscopy w/ polypectomy   2006    Dr. Tamala Julian: anal fissure, sessile adenomatous polyp, diverticulosis  . Esophagogastroduodenoscopy  11/15/2010    Procedure: ESOPHAGOGASTRODUODENOSCOPY (EGD);  Surgeon: Dorothyann Peng, MD;  Location: [facility];  Service: Endoscopy;  Laterality: N/A;  with propofol sedation; procedure start @ 0813  . Flexible sigmoidoscopy  11/15/2010    Procedure: FLEXIBLE SIGMOIDOSCOPY;  Surgeon: Dorothyann Peng, MD;  Location: [facility];  Service: Endoscopy;  Laterality: N/A;  with propofol sedation; ended @ 0808  . Polypectomy  12/13/2010    Procedure: POLYPECTOMY;  Surgeon: Dorothyann Peng, MD;  Location: [facility];  Service: Endoscopy;  Laterality: N/A;  right colon, cecal, transverse, and sigmoid polypectomy  . Cardiac catheterization  10/04/11    Normal left main, 95% pLAD stenosis with ulceration s/p successful BMS placement, 30% segmental plaque beyond this, dLAD after D2 with 50% plaquing, then focal 70% lesion, 80% focal short D2 stenosis, 30% pOM2 lesion, 30-40% mOM3 stenosis, Shephard's crook region of RCA with 50% lesion, mRCA 50-60% lesion and mild dRCA irregularities.   . Coronary stent placement  10/04/11  . Abdominal aortic aneurysm repair w/ endoluminal graft      01/16/2008  . Left heart catheterization with coronary angiogram N/A 10/04/2011    Procedure: LEFT HEART CATHETERIZATION WITH CORONARY  ANGIOGRAM;  Surgeon: Hillary Bow, MD;  Location: [facility] CATH LAB;  Service: Cardiovascular;  Laterality: N/A;    There were no vitals taken for this visit.  Visit Diagnosis:  Impaired functional mobility, balance, gait, and endurance - Plan: PT plan of care cert/re-cert  CVA (cerebral vascular accident) - Plan: PT plan of care cert/re-cert  Abnormality of gait - Plan: PT plan of care cert/re-cert  Decreased functional activity tolerance - Plan: PT plan of care cert/re-cert      Subjective Assessment - 05/28/14 1306    Symptoms MD did not send specific paperwork regarding location of stroke; patient  states that his doctor told him it was "deep".    Pertinent History Patient presents with history of strokes; no falls recently, has a history of strokes in the past.    How long can you sit comfortably? No limits   How long can you stand comfortably? Patient states that he doesn't really get tired, his balance is what limits his standing   How long can you walk comfortably? Patient has not done much walking since his most recent stroke   Patient Stated Goals Be able to bathe and walk easier, get stronger   Currently in Pain? No/denies          [facility] PT Assessment - 05/28/14 0001    Assessment   Medical Diagnosis CVA   Onset Date 05/23/14  approximate date   Next MD Visit 2 months from now   Balance Screen   Has the patient fallen in the past 6 months No   Has the patient had a decrease in activity level because of a fear of falling?  Yes   Is the patient reluctant to leave their home because of a fear of falling?  No   Observation/Other Assessments   Observations Gait: reduced step length, reduced gait speed, hip weakness and reduced range of motion, flexed postrue, reduced TKE, reduced dorsiflexion bilaterally   AROM   Right Hip External Rotation  47   Right Hip Internal Rotation  33   Left Hip External Rotation  44   Left Hip Internal Rotation  25   Right Ankle Dorsiflexion 4   Left Ankle Dorsiflexion 5   Strength   Right Hip Flexion 4+/5   Right Hip Extension 2+/5   Right Hip ABduction 4/5   Left Hip Flexion 4+/5   Left Hip Extension 2+/5   Left Hip ABduction 4/5   Right Knee Flexion 4+/5   Right Knee Extension 5/5   Left Knee Flexion 5/5   Left Knee Extension 5/5   Right Ankle Dorsiflexion 5/5   Left Ankle Dorsiflexion 5/5   Flexibility   Hamstrings Moderate tightness R, Min tightness L   Quadriceps 32cm from IT R, 32cm from IT L   6 Minute Walk- Baseline   6 Minute Walk- Baseline yes   Berg Balance Test   Sit to Stand Able to stand  independently using hands    Standing Unsupported Able to stand 2 minutes with supervision   Sitting with Back Unsupported but Feet Supported on Floor or Stool Able to sit 2 minutes under supervision   Stand to Sit Controls descent by using hands   Transfers Able to transfer safely, definite need of hands   Standing Unsupported with Eyes Closed Able to stand 10 seconds with supervision   Standing Ubsupported with Feet Together Able to place feet together independently and stand for 1 minute with supervision   From Standing,  Reach Forward with Outstretched Arm Can reach confidently >25 cm (10")   From Standing Position, Pick up Object from Floor Able to pick up shoe, needs supervision   From Standing Position, Turn to Look Behind Over each Shoulder Turn sideways only but maintains balance   Turn 360 Degrees Able to turn 360 degrees safely but slowly   Standing Unsupported, Alternately Place Feet on Step/Stool Able to complete 4 steps without aid or supervision   Standing Unsupported, One Foot in Front Able to plae foot ahead of the other independently and hold 30 seconds   Standing on One Leg Tries to lift leg/unable to hold 3 seconds but remains standing independently   Total Score 38   High Level Balance   High Level Balance Comments 6MWT 493ft                          PT Education - 05/28/14 1356    Education provided Yes   Education Details Prognosis, HEP, need for endurance based activities   Person(s) Educated Patient   Methods Explanation   Comprehension Verbalized understanding          PT Short Term Goals - 05/28/14 1410    PT SHORT TERM GOAL #1   Title Patient will demonstrate the ability to ambulate at least 731ft with walker in 6 minutes, improved step lengths and upright posture during gait   Time 4   Period Weeks   Status New   PT SHORT TERM GOAL #2   Title Patient will improve Berg balance score to at least 45 in order to reduce overall fall risk   Time 4   Period Weeks    Status New   PT SHORT TERM GOAL #3   Title Patient will demonstrate improved bilateral ankle dorsiflexion to 12 degrees and bilateral hip IR  to at least 35 degrees in order to enhance  balance reaction skills   Time 4   Period Weeks   Status New   PT SHORT TERM GOAL #4   Title Patient will demonstrate the ability to maintain single leg stance on each leg for at least 20 seconds   Time 4   Period Weeks   Status New           PT Long Term Goals - 05/28/14 1419    PT LONG TERM GOAL #1   Title Patient will be independent in correctly and consistently performing advanced HEP   Time 8   Period Weeks   Status New   PT LONG TERM GOAL #2   Title Patient will demonstrate the ability to ambulate at least 1287ft within 6 minutes with walker, improved gait mechanics, upright posture   Time 8   Period Weeks   Status New   PT LONG TERM GOAL #3   Title Patient will demonstrate the ability to ambulate over uneven surfaces with LRAD, improved gait mechanics, upright posture, and minimal loss of balance    Time 8   Period Weeks   Status New   PT LONG TERM GOAL #4   Title Patient will demonstrate the ability to maintain SLS on each leg for at least 30 seconds   Time 8   Period Weeks   Status New   PT LONG TERM GOAL #5   Title Patient will demonstrate the ability to perform sit to stand without use of hands from surface as low as 14 inches    Time 8  Period Weeks   Status New               Plan - 2014/06/01 1400    Clinical Impression Statement Very pleasant elderly gentleman presenting status post CVA; patient has had multiple strokes in the past. Patient demonstrates significant gait impairments, reduced overall flexibility, postural impairment, reduced mobilty of hips and ankles, and significantly reduced functional activity tolerance. Patient will benefit from skilled PT services in order to address these impairments  as well as to enhance patient's overall functional abilitiy.     Pt will benefit from skilled therapeutic intervention in order to improve on the following deficits Abnormal gait;Decreased endurance;Impaired perceived functional ability;Improper body mechanics;Decreased activity tolerance;Impaired flexibility;Postural dysfunction;Decreased balance;Decreased mobility;Decreased range of motion;Decreased coordination;Decreased safety awareness   Rehab Potential Good   PT Frequency 2x / week   PT Duration 8 weeks   PT Treatment/Interventions ADLs/Self Care Home Management;Gait training;Stair training;Functional mobility training;Patient/family education;Therapeutic activities;Therapeutic exercise;Manual techniques;Energy conservation;DME Instruction;Balance training   PT Next Visit Plan Functional flexiblity and endurance activities; functional mobility tasks   PT Home Exercise Plan given   Consulted and Agree with Plan of Care Patient          G-Codes - 2014-06-01 1434    Functional Assessment Tool Used Skilled clinical assessment and observation; primarily based on reduced functional activity tolerance and gait impairment   Functional Limitation Mobility: Walking and moving around   Mobility: Walking and Moving Around Current Status ([id number]) At least 60 percent but less than 80 percent impaired, limited or restricted   Mobility: Walking and Moving Around Goal Status [phone]) At least 40 percent but less than 60 percent impaired, limited or restricted       Problem List Patient Active Problem List   Diagnosis Date Noted  . Dizziness   . Malnutrition of moderate degree 05/24/2014  . ICH (intracerebral hemorrhage) 05/23/2014  . HTN (hypertension) 05/19/2014  . Diarrhea 11/25/2013  . Fecal impaction with overflow incontinence of loose stool unlikely infectious 11/25/2013  . Acute kidney injury 11/25/2013  . Aftercare following surgery of the circulatory system, [place] 04/05/2013  . Anemia 12/08/2012  . Protein-calorie malnutrition, severe 12/06/2012  .  CKD (chronic kidney disease), stage III 12/06/2012  . Thrombocytopenia, unspecified 12/06/2012  . Fall at home 12/06/2012  . Acute bronchitis 12/06/2012  . UTI (urinary tract infection) 12/06/2012  . Effusion of right knee joint 12/06/2012  . Altered mental status 12/05/2012  . Vascular dementia 12/05/2012  . CAD (coronary artery disease) 10/07/2011  . Chronic systolic CHF (congestive heart failure) 10/07/2011  . Acute respiratory disease 10/03/2011  . Non-STEMI (non-ST elevated myocardial infarction) 10/02/2011  . Hypokalemia 10/02/2011  . Abdominal aneurysm without mention of rupture 04/04/2011  . History of noncompliance with medical treatment   . Cerebrovascular disease   . Adenomatous polyp 10/19/2010  . Hypertensive heart disease without CHF   . Hyperlipidemia   . Chronic kidney disease   . Alcohol abuse   . Tobacco abuse   . Chronic obstructive pulmonary disease 02/09/2010  . History of  abdominal aortic aneurysm (stent graft)     Deniece Ree PT, DPT [place], [place], [address] Phone: [phone]   Fax:  [phone]

## 2014-06-11 ENCOUNTER — Ambulatory Visit (HOSPITAL_COMMUNITY): Payer: Medicare Other

## 2014-06-11 DIAGNOSIS — Z7409 Other reduced mobility: Secondary | ICD-10-CM

## 2014-06-11 DIAGNOSIS — R6889 Other general symptoms and signs: Secondary | ICD-10-CM

## 2014-06-11 DIAGNOSIS — I639 Cerebral infarction, unspecified: Secondary | ICD-10-CM

## 2014-06-11 DIAGNOSIS — R269 Unspecified abnormalities of gait and mobility: Secondary | ICD-10-CM

## 2014-06-11 DIAGNOSIS — I69398 Other sequelae of cerebral infarction: Secondary | ICD-10-CM | POA: Diagnosis not present

## 2014-06-11 NOTE — Therapy (Signed)
[place], [place], [address] Phone: [phone]   Fax:  [phone]  Physical Therapy Treatment  Patient Details  Name: [name] MRN: [id number] Date of Birth: 1946-02-09 Referring Provider:  Kathyrn Drown, MD  Encounter Date: 06/11/2014      PT End of Session - 06/11/14 1538    Visit Number 2   Number of Visits 16   Date for PT Re-Evaluation 06/25/14   Authorization Type BCBS Medicare   Authorization Time Period 05/28/14 to 07/28/14   Authorization - Visit Number 2   Authorization - Number of Visits 10   PT Start Time 1518   PT Stop Time 1600   PT Time Calculation (min) 42 min   Equipment Utilized During Treatment Gait belt   Activity Tolerance Patient limited by fatigue;Patient tolerated treatment well   Behavior During Therapy [facility] for tasks assessed/performed      Past Medical History  Diagnosis Date  . Hypertension   . COPD (chronic obstructive pulmonary disease)   . Hyperlipidemia   . Cardiomyopathy     EF of 30% per echo in June of 2012; admitted with congestive heart failure; nonobstructive CAD on cath in 08/2010  . History of noncompliance with medical treatment   . Cerebrovascular disease 2011    CVA  in 02/2010-only deficit is decreased vision in  right eye  . Alcohol abuse   . Tobacco abuse     40 pack years  . Abdominal aortic aneurysm     Stent graft repair  . Stroke 2011    decreased vision of right eye  . Chronic systolic CHF (congestive heart failure)   . Leg pain   . CKD (chronic kidney disease) stage 3, GFR 30-59 ml/min     creatinine-1.7 in 08/2010  . CAD (coronary artery disease)   . Myocardial infarction acute 10/01/11  . CKD (chronic kidney disease), stage III 12/06/2012  . Effusion of right knee joint 12/06/2012  . Anemia   . Atrial fibrillation   . Ataxic gait   . Weakness generalized   . Frequent falls   . Forgetfulness     Past Surgical History  Procedure  Laterality Date  . Colonoscopy w/ polypectomy  2006    Dr. Tamala Julian: anal fissure, sessile adenomatous polyp, diverticulosis  . Esophagogastroduodenoscopy  11/15/2010    Procedure: ESOPHAGOGASTRODUODENOSCOPY (EGD);  Surgeon: Dorothyann Peng, MD;  Location: [facility];  Service: Endoscopy;  Laterality: N/A;  with propofol sedation; procedure start @ 0813  . Flexible sigmoidoscopy  11/15/2010    Procedure: FLEXIBLE SIGMOIDOSCOPY;  Surgeon: Dorothyann Peng, MD;  Location: [facility];  Service: Endoscopy;  Laterality: N/A;  with propofol sedation; ended @ 0808  . Polypectomy  12/13/2010    Procedure: POLYPECTOMY;  Surgeon: Dorothyann Peng, MD;  Location: [facility];  Service: Endoscopy;  Laterality: N/A;  right colon, cecal, transverse, and sigmoid polypectomy  . Cardiac catheterization  10/04/11    Normal left main, 95% pLAD stenosis with ulceration s/p successful BMS placement, 30% segmental plaque beyond this, dLAD after D2 with 50% plaquing, then focal 70% lesion, 80% focal short D2 stenosis, 30% pOM2 lesion, 30-40% mOM3 stenosis, Shephard's crook region of RCA with 50% lesion, mRCA 50-60% lesion and mild dRCA irregularities.   . Coronary stent placement  10/04/11  . Abdominal aortic aneurysm repair w/ endoluminal graft      01/16/2008  . Left heart catheterization with coronary angiogram N/A 10/04/2011  Procedure: LEFT HEART CATHETERIZATION WITH CORONARY ANGIOGRAM;  Surgeon: Hillary Bow, MD;  Location: [facility] CATH LAB;  Service: Cardiovascular;  Laterality: N/A;    There were no vitals filed for this visit.  Visit Diagnosis:  CVA (cerebral vascular accident)  Abnormality of gait  Impaired functional mobility, balance, gait, and endurance  Decreased functional activity tolerance      Subjective Assessment - 06/11/14 1527    Symptoms Pt stated his main problem is with dizziness, reports he was wore out following last session.  No reports of pain today.   Currently in Pain? No/denies             [facility] PT Assessment - 06/11/14 0001    Assessment   Medical Diagnosis CVA   Onset Date 05/23/14   Next MD Visit Leonie Man 2 months from now                   [facility] Adult PT Treatment/Exercise - 06/11/14 0001    Exercises   Exercises Knee/Hip   Knee/Hip Exercises: Stretches   Gastroc Stretch 3 reps;30 seconds   Gastroc Stretch Limitations slant board   Knee/Hip Exercises: Standing   Heel Raises 10 reps   Heel Raises Limitations Toe raises 10   Functional Squat 10 reps   Functional Squat Limitations 3D hip excursion    Gait Training 6 min walk with walker 562feet   Other Standing Knee Exercises Bil hip extension 10x   Other Standing Knee Exercises 5 STS no HHA with mod assistnace from 22in chair with airex   Knee/Hip Exercises: Seated   Other Seated Knee Exercises Eye gaze exercises to improve focus with dizziness 5 reps each                  PT Short Term Goals - 06/11/14 1648    PT SHORT TERM GOAL #1   Title Patient will demonstrate the ability to ambulate at least 768ft with walker in 6 minutes, improved step lengths and upright posture during gait   Status On-going   PT SHORT TERM GOAL #2   Title Patient will improve Berg balance score to at least 45 in order to reduce overall fall risk   Status On-going   PT SHORT TERM GOAL #3   Title Patient will demonstrate improved bilateral ankle dorsiflexion to 12 degrees and bilateral hip IR  to at least 35 degrees in order to enhance  balance reaction skills   Status On-going   PT SHORT TERM GOAL #4   Title Patient will demonstrate the ability to maintain single leg stance on each leg for at least 20 seconds           PT Long Term Goals - 06/11/14 1648    PT LONG TERM GOAL #1   Title Patient will be independent in correctly and consistently performing advanced HEP   PT LONG TERM GOAL #2   Title Patient will demonstrate the ability to ambulate at least 1257ft within 6 minutes with walker, improved gait  mechanics, upright posture   PT LONG TERM GOAL #3   Title Patient will demonstrate the ability to ambulate over uneven surfaces with LRAD, improved gait mechanics, upright posture, and minimal loss of balance    PT LONG TERM GOAL #4   Title Patient will demonstrate the ability to maintain SLS on each leg for at least 30 seconds   PT LONG TERM GOAL #5   Title Patient will demonstrate the ability to perform sit  to stand without use of hands from surface as low as 14 inches                Plan - 06/11/14 1635    Clinical Impression Statement Began session with eye gaze exercises to improve focus to reduce dizziness s/s through session.  No noted nystagmus with gaze exercises, more difficutly following R/L than up to down.   Session focus on improving functional strengthening and improving tolerance with standing.  6 minute wakling test complete  with cueing to increased stride length with heel to toe pattern and posture.  Tried to adjust walker height to improve posture with gait, already at heghest it will go.     PT Next Visit Plan Functional flexiblity and endurance activities; functional mobility tasks        Problem List Patient Active Problem List   Diagnosis Date Noted  . Dizziness   . Malnutrition of moderate degree 05/24/2014  . ICH (intracerebral hemorrhage) 05/23/2014  . HTN (hypertension) 05/19/2014  . Diarrhea 11/25/2013  . Fecal impaction with overflow incontinence of loose stool unlikely infectious 11/25/2013  . Acute kidney injury 11/25/2013  . Aftercare following surgery of the circulatory system, [place] 04/05/2013  . Anemia 12/08/2012  . Protein-calorie malnutrition, severe 12/06/2012  . CKD (chronic kidney disease), stage III 12/06/2012  . Thrombocytopenia, unspecified 12/06/2012  . Fall at home 12/06/2012  . Acute bronchitis 12/06/2012  . UTI (urinary tract infection) 12/06/2012  . Effusion of right knee joint 12/06/2012  . Altered mental status 12/05/2012   . Vascular dementia 12/05/2012  . CAD (coronary artery disease) 10/07/2011  . Chronic systolic CHF (congestive heart failure) 10/07/2011  . Acute respiratory disease 10/03/2011  . Non-STEMI (non-ST elevated myocardial infarction) 10/02/2011  . Hypokalemia 10/02/2011  . Abdominal aneurysm without mention of rupture 04/04/2011  . History of noncompliance with medical treatment   . Cerebrovascular disease   . Adenomatous polyp 10/19/2010  . Hypertensive heart disease without CHF   . Hyperlipidemia   . Chronic kidney disease   . Alcohol abuse   . Tobacco abuse   . Chronic obstructive pulmonary disease 02/09/2010  . History of  abdominal aortic aneurysm (stent graft)    Ihor Austin, [place]  Aldona Lento 06/11/2014, 4:49 PM  [place] [address] [place], [place], [address] Phone: [phone]   Fax:  [phone]

## 2014-06-16 ENCOUNTER — Telehealth: Payer: Self-pay | Admitting: *Deleted

## 2014-06-16 NOTE — Telephone Encounter (Signed)
Form,[employer] sent to [facility] and Dr Leonie Man 06-16-14.

## 2014-06-17 ENCOUNTER — Ambulatory Visit (INDEPENDENT_AMBULATORY_CARE_PROVIDER_SITE_OTHER): Payer: Medicare Other | Admitting: Family Medicine

## 2014-06-17 ENCOUNTER — Encounter: Payer: Self-pay | Admitting: Family Medicine

## 2014-06-17 ENCOUNTER — Ambulatory Visit (HOSPITAL_COMMUNITY): Payer: Medicare Other

## 2014-06-17 VITALS — BP 132/74 | Ht 75.0 in | Wt 169.0 lb

## 2014-06-17 DIAGNOSIS — I639 Cerebral infarction, unspecified: Secondary | ICD-10-CM

## 2014-06-17 DIAGNOSIS — R269 Unspecified abnormalities of gait and mobility: Secondary | ICD-10-CM

## 2014-06-17 DIAGNOSIS — I69398 Other sequelae of cerebral infarction: Secondary | ICD-10-CM | POA: Diagnosis not present

## 2014-06-17 DIAGNOSIS — Z7409 Other reduced mobility: Secondary | ICD-10-CM

## 2014-06-17 DIAGNOSIS — N185 Chronic kidney disease, stage 5: Secondary | ICD-10-CM

## 2014-06-17 DIAGNOSIS — I10 Essential (primary) hypertension: Secondary | ICD-10-CM

## 2014-06-17 DIAGNOSIS — R6889 Other general symptoms and signs: Secondary | ICD-10-CM

## 2014-06-17 DIAGNOSIS — Z0289 Encounter for other administrative examinations: Secondary | ICD-10-CM

## 2014-06-17 NOTE — Progress Notes (Signed)
   Subjective:    Patient ID: [name], male    DOB: [birth year], 69 y.o.   MRN: [id number]  HPI Patient arrives for a recheck of his blood pressure. Patient states he has no concerns. Patient overall doing fairly well no particular major problems. Activity levels been good within reason he moves around the house sees been eating some drinking some no fevers lately no vomiting.  Review of Systems     Objective:   Physical Exam Lungs clear heart trigger pulse normal blood pressure is good extremities no edema skin warm dry       Assessment & Plan:  End stage renal disease he needs a go ahead and see vascular surgeon get shunt put in I instructed the wife to call the kidney doctor if still haven't heard appointment by next week.  If disorientation fever vomiting poor feeding passing out or other serious issue go to the emergency department  Follow-up 4 weeks

## 2014-06-17 NOTE — Therapy (Signed)
[place], [place], [address] Phone: [phone]   Fax:  [phone]  Physical Therapy Treatment  Patient Details  Name: [name] MRN: [id number] Date of Birth: [birth year] Referring Provider:  Kathyrn Drown, MD  Encounter Date: 06/17/2014      PT End of Session - 06/17/14 1351    Visit Number 3   Number of Visits 16   Date for PT Re-Evaluation 06/25/14   Authorization Type BCBS Medicare   Authorization Time Period 05/28/14 to 07/28/14   Authorization - Visit Number 3   Authorization - Number of Visits 10   PT Start Time 1540   PT Stop Time 1438   PT Time Calculation (min) 50 min   Equipment Utilized During Treatment Gait belt   Activity Tolerance Patient tolerated treatment well   Behavior During Therapy [facility] for tasks assessed/performed      Past Medical History  Diagnosis Date  . Hypertension   . COPD (chronic obstructive pulmonary disease)   . Hyperlipidemia   . Cardiomyopathy     EF of 30% per echo in June of 2012; admitted with congestive heart failure; nonobstructive CAD on cath in 08/2010  . History of noncompliance with medical treatment   . Cerebrovascular disease 2011    CVA  in 02/2010-only deficit is decreased vision in  right eye  . Alcohol abuse   . Tobacco abuse     40 pack years  . Abdominal aortic aneurysm     Stent graft repair  . Stroke 2011    decreased vision of right eye  . Chronic systolic CHF (congestive heart failure)   . Leg pain   . CKD (chronic kidney disease) stage 3, GFR 30-59 ml/min     creatinine-1.7 in 08/2010  . CAD (coronary artery disease)   . Myocardial infarction acute 10/01/11  . CKD (chronic kidney disease), stage III 12/06/2012  . Effusion of right knee joint 12/06/2012  . Anemia   . Atrial fibrillation   . Ataxic gait   . Weakness generalized   . Frequent falls   . Forgetfulness     Past Surgical History  Procedure Laterality Date  .  Colonoscopy w/ polypectomy  2006    Dr. Tamala Julian: anal fissure, sessile adenomatous polyp, diverticulosis  . Esophagogastroduodenoscopy  11/15/2010    Procedure: ESOPHAGOGASTRODUODENOSCOPY (EGD);  Surgeon: Dorothyann Peng, MD;  Location: [facility];  Service: Endoscopy;  Laterality: N/A;  with propofol sedation; procedure start @ 0813  . Flexible sigmoidoscopy  11/15/2010    Procedure: FLEXIBLE SIGMOIDOSCOPY;  Surgeon: Dorothyann Peng, MD;  Location: [facility];  Service: Endoscopy;  Laterality: N/A;  with propofol sedation; ended @ 0808  . Polypectomy  12/13/2010    Procedure: POLYPECTOMY;  Surgeon: Dorothyann Peng, MD;  Location: [facility];  Service: Endoscopy;  Laterality: N/A;  right colon, cecal, transverse, and sigmoid polypectomy  . Cardiac catheterization  10/04/11    Normal left main, 95% pLAD stenosis with ulceration s/p successful BMS placement, 30% segmental plaque beyond this, dLAD after D2 with 50% plaquing, then focal 70% lesion, 80% focal short D2 stenosis, 30% pOM2 lesion, 30-40% mOM3 stenosis, Shephard's crook region of RCA with 50% lesion, mRCA 50-60% lesion and mild dRCA irregularities.   . Coronary stent placement  10/04/11  . Abdominal aortic aneurysm repair w/ endoluminal graft      01/16/2008  . Left heart catheterization with coronary angiogram N/A 10/04/2011    Procedure:  LEFT HEART CATHETERIZATION WITH CORONARY ANGIOGRAM;  Surgeon: Hillary Bow, MD;  Location: [facility] CATH LAB;  Service: Cardiovascular;  Laterality: N/A;    There were no vitals filed for this visit.  Visit Diagnosis:  Abnormality of gait  Impaired functional mobility, balance, gait, and endurance  Decreased functional activity tolerance  CVA (cerebral vascular accident)      Subjective Assessment - 06/17/14 1349    Symptoms Pt stated the dizziness continues, no pain today.   Currently in Pain? No/denies                       [facility] Adult PT Treatment/Exercise - 06/17/14 0001    Exercises    Exercises Knee/Hip   Knee/Hip Exercises: Stretches   Gastroc Stretch 3 reps;30 seconds   Gastroc Stretch Limitations slant board   Knee/Hip Exercises: Aerobic   Stationary Bike Nustep x 8 min hill level 3, resistance 3    Knee/Hip Exercises: Standing   Heel Raises 15 reps   Heel Raises Limitations Toe raises 10   Functional Squat 10 reps   Other Standing Knee Exercises 10 STS 22in chair with airex min-mod assistance   Knee/Hip Exercises: Seated   Other Seated Knee Exercises Eye gaze exercises to improve focus with dizziness 5 reps each             Balance Exercises - 06/17/14 1431    Balance Exercises: Standing   Tandem Stance Eyes open;5 reps;30 secs   Tandem Gait 1 rep   Retro Gait 2 reps           PT Education - 06/17/14 1631    Education provided Yes   Education Details Eye gaze exercises   Person(s) Educated Patient;Spouse   Methods Explanation;Demonstration;Verbal cues   Comprehension Verbalized understanding;Returned demonstration          PT Short Term Goals - 06/17/14 1454    PT SHORT TERM GOAL #1   Title Patient will demonstrate the ability to ambulate at least 726ft with walker in 6 minutes, improved step lengths and upright posture during gait   PT SHORT TERM GOAL #2   Title Patient will improve Berg balance score to at least 45 in order to reduce overall fall risk   Status On-going   PT SHORT TERM GOAL #3   Title Patient will demonstrate improved bilateral ankle dorsiflexion to 12 degrees and bilateral hip IR  to at least 35 degrees in order to enhance  balance reaction skills   Status On-going   PT SHORT TERM GOAL #4   Title Patient will demonstrate the ability to maintain single leg stance on each leg for at least 20 seconds   Status On-going           PT Long Term Goals - 06/17/14 1455    PT LONG TERM GOAL #1   Title Patient will be independent in correctly and consistently performing advanced HEP   PT LONG TERM GOAL #2   Title Patient  will demonstrate the ability to ambulate at least 1274ft within 6 minutes with walker, improved gait mechanics, upright posture   PT LONG TERM GOAL #3   Title Patient will demonstrate the ability to ambulate over uneven surfaces with LRAD, improved gait mechanics, upright posture, and minimal loss of balance    PT LONG TERM GOAL #4   Title Patient will demonstrate the ability to maintain SLS on each leg for at least 30 seconds   PT LONG TERM  GOAL #5   Title Patient will demonstrate the ability to perform sit to stand without use of hands from surface as low as 14 inches                Plan - 06/17/14 1624    Clinical Impression Statement Continued eye gaze exercises for eye strengthening to assist with dizzineess.  Instructed wife to add to HEP.  Pt./wife report new blood pressure medication with s/s including dizziness.  Progressed balance this session with min assistnace and cueing to improve spatial awareness to improve focus which improved balance and reduced c/o dizziness during exercise.  Min assistance and cueing for weight loading to improve sit to stands, continued with airex on chair to increase height for increase ease due to weak gluteal musculature.     PT Next Visit Plan Functional flexiblity and endurance activities; functional mobility tasks.  Alternate with 6 min walking at end of session and nustep for activity tolerance and strengthening.  Begin lunges on step and continues with eye gaze exercises and balance activities.        Problem List Patient Active Problem List   Diagnosis Date Noted  . Dizziness   . Malnutrition of moderate degree 05/24/2014  . ICH (intracerebral hemorrhage) 05/23/2014  . HTN (hypertension) 05/19/2014  . Diarrhea 11/25/2013  . Fecal impaction with overflow incontinence of loose stool unlikely infectious 11/25/2013  . Acute kidney injury 11/25/2013  . Aftercare following surgery of the circulatory system, [place] 04/05/2013  . Anemia  12/08/2012  . Protein-calorie malnutrition, severe 12/06/2012  . CKD (chronic kidney disease), stage III 12/06/2012  . Thrombocytopenia, unspecified 12/06/2012  . Fall at home 12/06/2012  . Acute bronchitis 12/06/2012  . UTI (urinary tract infection) 12/06/2012  . Effusion of right knee joint 12/06/2012  . Altered mental status 12/05/2012  . Vascular dementia 12/05/2012  . CAD (coronary artery disease) 10/07/2011  . Chronic systolic CHF (congestive heart failure) 10/07/2011  . Acute respiratory disease 10/03/2011  . Non-STEMI (non-ST elevated myocardial infarction) 10/02/2011  . Hypokalemia 10/02/2011  . Abdominal aneurysm without mention of rupture 04/04/2011  . History of noncompliance with medical treatment   . Cerebrovascular disease   . Adenomatous polyp 10/19/2010  . Hypertensive heart disease without CHF   . Hyperlipidemia   . Chronic kidney disease   . Alcohol abuse   . Tobacco abuse   . Chronic obstructive pulmonary disease 02/09/2010  . History of  abdominal aortic aneurysm (stent graft)    Ihor Austin, LPTA; [place], [place] #[id number] [phone]  Aldona Lento 06/17/2014, 4:31 PM  [facility] [address] [place], [place], [address] Phone: [phone]   Fax:  [phone]

## 2014-06-19 ENCOUNTER — Ambulatory Visit (HOSPITAL_COMMUNITY): Payer: Medicare Other

## 2014-06-19 ENCOUNTER — Other Ambulatory Visit: Payer: Self-pay

## 2014-06-19 DIAGNOSIS — R269 Unspecified abnormalities of gait and mobility: Secondary | ICD-10-CM

## 2014-06-19 DIAGNOSIS — I639 Cerebral infarction, unspecified: Secondary | ICD-10-CM

## 2014-06-19 DIAGNOSIS — R6889 Other general symptoms and signs: Secondary | ICD-10-CM

## 2014-06-19 DIAGNOSIS — I69398 Other sequelae of cerebral infarction: Secondary | ICD-10-CM | POA: Diagnosis not present

## 2014-06-19 DIAGNOSIS — N185 Chronic kidney disease, stage 5: Secondary | ICD-10-CM

## 2014-06-19 DIAGNOSIS — Z01810 Encounter for preprocedural cardiovascular examination: Secondary | ICD-10-CM

## 2014-06-19 DIAGNOSIS — Z7409 Other reduced mobility: Secondary | ICD-10-CM

## 2014-06-19 NOTE — Therapy (Signed)
[place], [place], [address] Phone: [phone]   Fax:  [phone]  Physical Therapy Treatment  Patient Details  Name: [name] MRN: [id number] Date of Birth: 01/04/1946 Referring Provider:  Kathyrn Drown, MD  Encounter Date: 06/19/2014      PT End of Session - 06/19/14 1319    Visit Number 4   Number of Visits 16   Date for PT Re-Evaluation 06/25/14   Authorization Type BCBS Medicare   Authorization Time Period 05/28/14 to 07/28/14   Authorization - Visit Number 4   Authorization - Number of Visits 10   PT Start Time 1304   PT Stop Time 1345   PT Time Calculation (min) 41 min   Activity Tolerance Patient tolerated treatment well   Behavior During Therapy [facility] for tasks assessed/performed      Past Medical History  Diagnosis Date  . Hypertension   . COPD (chronic obstructive pulmonary disease)   . Hyperlipidemia   . Cardiomyopathy     EF of 30% per echo in June of 2012; admitted with congestive heart failure; nonobstructive CAD on cath in 08/2010  . History of noncompliance with medical treatment   . Cerebrovascular disease 2011    CVA  in 02/2010-only deficit is decreased vision in  right eye  . Alcohol abuse   . Tobacco abuse     40 pack years  . Abdominal aortic aneurysm     Stent graft repair  . Stroke 2011    decreased vision of right eye  . Chronic systolic CHF (congestive heart failure)   . Leg pain   . CKD (chronic kidney disease) stage 3, GFR 30-59 ml/min     creatinine-1.7 in 08/2010  . CAD (coronary artery disease)   . Myocardial infarction acute 10/01/11  . CKD (chronic kidney disease), stage III 12/06/2012  . Effusion of right knee joint 12/06/2012  . Anemia   . Atrial fibrillation   . Ataxic gait   . Weakness generalized   . Frequent falls   . Forgetfulness     Past Surgical History  Procedure Laterality Date  . Colonoscopy w/ polypectomy  2006    Dr. Tamala Julian: anal  fissure, sessile adenomatous polyp, diverticulosis  . Esophagogastroduodenoscopy  11/15/2010    Procedure: ESOPHAGOGASTRODUODENOSCOPY (EGD);  Surgeon: Dorothyann Peng, MD;  Location: [facility];  Service: Endoscopy;  Laterality: N/A;  with propofol sedation; procedure start @ 0813  . Flexible sigmoidoscopy  11/15/2010    Procedure: FLEXIBLE SIGMOIDOSCOPY;  Surgeon: Dorothyann Peng, MD;  Location: [facility];  Service: Endoscopy;  Laterality: N/A;  with propofol sedation; ended @ 0808  . Polypectomy  12/13/2010    Procedure: POLYPECTOMY;  Surgeon: Dorothyann Peng, MD;  Location: [facility];  Service: Endoscopy;  Laterality: N/A;  right colon, cecal, transverse, and sigmoid polypectomy  . Cardiac catheterization  10/04/11    Normal left main, 95% pLAD stenosis with ulceration s/p successful BMS placement, 30% segmental plaque beyond this, dLAD after D2 with 50% plaquing, then focal 70% lesion, 80% focal short D2 stenosis, 30% pOM2 lesion, 30-40% mOM3 stenosis, Shephard's crook region of RCA with 50% lesion, mRCA 50-60% lesion and mild dRCA irregularities.   . Coronary stent placement  10/04/11  . Abdominal aortic aneurysm repair w/ endoluminal graft      01/16/2008  . Left heart catheterization with coronary angiogram N/A 10/04/2011    Procedure: LEFT HEART CATHETERIZATION WITH CORONARY ANGIOGRAM;  Surgeon:  Hillary Bow, MD;  Location[facility] C[facility] LAB;  Service: Cardiovascular;  Laterality: N/A;    There were no vitals filed for this visit.  Visit Diagnosis:  Abnormality of gait  Impaired functional mobility, balance, gait, and endurance  Decreased functional activity tolerance  CVA (cerebral vascular accident)      Subjective Assessment - 06/19/14 1307    Symptoms Pt stated the dizziness continues, no pain today.   Currently in Pain? No/denies            [facility] PT Assessment - 06/19/14 0001    Assessment   Medical Diagnosis CVA   Onset Date 05/23/14   Next MD Visit [place] 2 months from now   High  Level Balance   High Level Balance Comments 6Minute walk 784 feet                    [facility] Adult PT Treatment/Exercise - 06/19/14 0001    Exercises   Exercises Knee/Hip   Knee/Hip Exercises: Stretches   Gastroc Stretch 3 reps;30 seconds   Gastroc Stretch Limitations slant board   Knee/Hip Exercises: Standing   Heel Raises 15 reps   Heel Raises Limitations Toe raises 10   Forward Lunges Both;10 reps   Forward Lunges Limitations 6in step   Functional Squat 15 reps   Gait Training 6 min walk with walker 734feet             Balance Exercises - 06/19/14 1821    Balance Exercises: Standing   SLS --  Rt 6", Lt 7"   Tandem Gait 2 reps   Retro Gait 2 reps   Sidestepping 2 reps             PT Short Term Goals - 06/19/14 1319    PT SHORT TERM GOAL #1   Title Patient will demonstrate the ability to ambulate at least 774ft with walker in 6 minutes, improved step lengths and upright posture during gait   Baseline 06/19/2014 776feet in 6 minute walk   Status Achieved   PT SHORT TERM GOAL #2   Title Patient will improve Berg balance score to at least 45 in order to reduce overall fall risk   Status On-going   PT SHORT TERM GOAL #3   Title Patient will demonstrate improved bilateral ankle dorsiflexion to 12 degrees and bilateral hip IR  to at least 35 degrees in order to enhance  balance reaction skills   Status On-going   PT SHORT TERM GOAL #4   Title Patient will demonstrate the ability to maintain single leg stance on each leg for at least 20 seconds           PT Long Term Goals - 06/19/14 1322    PT LONG TERM GOAL #1   Title Patient will be independent in correctly and consistently performing advanced HEP   PT LONG TERM GOAL #2   Title Patient will demonstrate the ability to ambulate at least 1216ft within 6 minutes with walker, improved gait mechanics, upright posture   PT LONG TERM GOAL #3   Title Patient will demonstrate the ability to ambulate over  uneven surfaces with LRAD, improved gait mechanics, upright posture, and minimal loss of balance    PT LONG TERM GOAL #4   Title Patient will demonstrate the ability to maintain SLS on each leg for at least 30 seconds   PT LONG TERM GOAL #5   Title Patient will demonstrate the ability to perform sit  to stand without use of hands from surface as low as 14 inches                Plan - 06/19/14 1815    Clinical Impression Statement Progressed to lunges for gluteal and hamstrings strengthening and advance balance activities.  Pt improving acitviity tolerance with increased dstance wtih 6 minute walk today to 775feet wtih RW.  Pt more aware of posture deficits this session with less cueing required.  No rest breaks    PT Next Visit Plan Functional flexiblity and endurance activities; functional mobility tasks.  Alternate with 6 min walking at end of session and nustep for activity tolerance and strengthening.  Continue functional strengthening and balance, ask pt about compliance with eye gaze exercises at home and dizziness.          Problem List Patient Active Problem List   Diagnosis Date Noted  . Dizziness   . Malnutrition of moderate degree 05/24/2014  . ICH (intracerebral hemorrhage) 05/23/2014  . HTN (hypertension) 05/19/2014  . Diarrhea 11/25/2013  . Fecal impaction with overflow incontinence of loose stool unlikely infectious 11/25/2013  . Acute kidney injury 11/25/2013  . Aftercare following surgery of the circulatory system, [place] 04/05/2013  . Anemia 12/08/2012  . Protein-calorie malnutrition, severe 12/06/2012  . CKD (chronic kidney disease), stage III 12/06/2012  . Thrombocytopenia, unspecified 12/06/2012  . Fall at home 12/06/2012  . Acute bronchitis 12/06/2012  . UTI (urinary tract infection) 12/06/2012  . Effusion of right knee joint 12/06/2012  . Altered mental status 12/05/2012  . Vascular dementia 12/05/2012  . CAD (coronary artery disease) 10/07/2011  .  Chronic systolic CHF (congestive heart failure) 10/07/2011  . Acute respiratory disease 10/03/2011  . Non-STEMI (non-ST elevated myocardial infarction) 10/02/2011  . Hypokalemia 10/02/2011  . Abdominal aneurysm without mention of rupture 04/04/2011  . History of noncompliance with medical treatment   . Cerebrovascular disease   . Adenomatous polyp 10/19/2010  . Hypertensive heart disease without CHF   . Hyperlipidemia   . Chronic kidney disease   . Alcohol abuse   . Tobacco abuse   . Chronic obstructive pulmonary disease 02/09/2010  . History of  abdominal aortic aneurysm (stent graft)    Ihor Austin, LPTA; [place], [place] #[id number] [phone]  Aldona Lento 06/19/2014, 6:22 PM  [place] [address] [place], [place], [address] Phone: [phone]   Fax:  [phone]

## 2014-06-24 ENCOUNTER — Ambulatory Visit (HOSPITAL_COMMUNITY): Payer: Medicare Other | Attending: Neurology | Admitting: Physical Therapy

## 2014-06-24 DIAGNOSIS — I69398 Other sequelae of cerebral infarction: Secondary | ICD-10-CM | POA: Insufficient documentation

## 2014-06-24 DIAGNOSIS — R6889 Other general symptoms and signs: Secondary | ICD-10-CM | POA: Diagnosis not present

## 2014-06-24 DIAGNOSIS — Z7409 Other reduced mobility: Secondary | ICD-10-CM | POA: Diagnosis not present

## 2014-06-24 DIAGNOSIS — R269 Unspecified abnormalities of gait and mobility: Secondary | ICD-10-CM | POA: Insufficient documentation

## 2014-06-24 DIAGNOSIS — I639 Cerebral infarction, unspecified: Secondary | ICD-10-CM

## 2014-06-24 NOTE — Therapy (Signed)
[place], [place], [address] Phone: [phone]   Fax:  [phone]  Physical Therapy Treatment  Patient Details  Name: [name] MRN: [id number] Date of Birth: [birth year] Referring Provider:  Garvin Fila, MD  Encounter Date: 06/24/2014      PT End of Session - 06/24/14 1614    Visit Number 5   Number of Visits 16   Date for PT Re-Evaluation 07/24/14   Authorization Type BCBS Medicare   Authorization Time Period 05/28/14 to 07/28/14   Authorization - Visit Number 5   PT Start Time 6712   PT Stop Time 1430   PT Time Calculation (min) 42 min   Activity Tolerance Patient tolerated treatment well   Behavior During Therapy [facility] for tasks assessed/performed      Past Medical History  Diagnosis Date  . Hypertension   . COPD (chronic obstructive pulmonary disease)   . Hyperlipidemia   . Cardiomyopathy     EF of 30% per echo in June of 2012; admitted with congestive heart failure; nonobstructive CAD on cath in 08/2010  . History of noncompliance with medical treatment   . Cerebrovascular disease 2011    CVA  in 02/2010-only deficit is decreased vision in  right eye  . Alcohol abuse   . Tobacco abuse     40 pack years  . Abdominal aortic aneurysm     Stent graft repair  . Stroke 2011    decreased vision of right eye  . Chronic systolic CHF (congestive heart failure)   . Leg pain   . CKD (chronic kidney disease) stage 3, GFR 30-59 ml/min     creatinine-1.7 in 08/2010  . CAD (coronary artery disease)   . Myocardial infarction acute 10/01/11  . CKD (chronic kidney disease), stage III 12/06/2012  . Effusion of right knee joint 12/06/2012  . Anemia   . Atrial fibrillation   . Ataxic gait   . Weakness generalized   . Frequent falls   . Forgetfulness     Past Surgical History  Procedure Laterality Date  . Colonoscopy w/ polypectomy  2006    Dr. Tamala Julian: anal fissure, sessile adenomatous polyp,  diverticulosis  . Esophagogastroduodenoscopy  11/15/2010    Procedure: ESOPHAGOGASTRODUODENOSCOPY (EGD);  Surgeon: Dorothyann Peng, MD;  Location: [facility];  Service: Endoscopy;  Laterality: N/A;  with propofol sedation; procedure start @ 0813  . Flexible sigmoidoscopy  11/15/2010    Procedure: FLEXIBLE SIGMOIDOSCOPY;  Surgeon: Dorothyann Peng, MD;  Location: [facility];  Service: Endoscopy;  Laterality: N/A;  with propofol sedation; ended @ 0808  . Polypectomy  12/13/2010    Procedure: POLYPECTOMY;  Surgeon: Dorothyann Peng, MD;  Location: [facility];  Service: Endoscopy;  Laterality: N/A;  right colon, cecal, transverse, and sigmoid polypectomy  . Cardiac catheterization  10/04/11    Normal left main, 95% pLAD stenosis with ulceration s/p successful BMS placement, 30% segmental plaque beyond this, dLAD after D2 with 50% plaquing, then focal 70% lesion, 80% focal short D2 stenosis, 30% pOM2 lesion, 30-40% mOM3 stenosis, Shephard's crook region of RCA with 50% lesion, mRCA 50-60% lesion and mild dRCA irregularities.   . Coronary stent placement  10/04/11  . Abdominal aortic aneurysm repair w/ endoluminal graft      01/16/2008  . Left heart catheterization with coronary angiogram N/A 10/04/2011    Procedure: LEFT HEART CATHETERIZATION WITH CORONARY ANGIOGRAM;  Surgeon: Hillary Bow, MD;  Location: [facility] CATH  LAB;  Service: Cardiovascular;  Laterality: N/A;    There were no vitals filed for this visit.  Visit Diagnosis:  Impaired functional mobility, balance, gait, and endurance  Abnormality of gait  Decreased functional activity tolerance  CVA (cerebral vascular accident)      Subjective Assessment - 06/24/14 1613    Subjective Patient states he just feels "off" today, remains dizzy   Pertinent History Patient presents with history of strokes; no falls recently, has a history of strokes in the past.             [facility] PT Assessment - 06/24/14 0001    AROM   Right Hip External Rotation  44    Right Hip Internal Rotation  35   Left Hip External Rotation  44   Left Hip Internal Rotation  33   Right Ankle Dorsiflexion 4   Left Ankle Dorsiflexion 5   Strength   Right Hip Flexion 5/5   Right Hip Extension 3-/5   Right Hip ABduction 4/5   Left Hip Flexion 5/5   Left Hip Extension 3-/5   Left Hip ABduction 4/5   Right Knee Flexion 5/5   Right Knee Extension 5/5   Left Knee Flexion 5/5   Left Knee Extension 5/5   Right Ankle Dorsiflexion 5/5   Left Ankle Dorsiflexion 5/5   Flexibility   Quadriceps 27cm from IT R, 28cm from IT L    Berg Balance Test   Sit to Stand Able to stand  independently using hands   Standing Unsupported Able to stand safely 2 minutes   Sitting with Back Unsupported but Feet Supported on Floor or Stool Able to sit safely and securely 2 minutes   Stand to Sit Controls descent by using hands   Transfers Able to transfer safely, minor use of hands   Standing Unsupported with Eyes Closed Able to stand 10 seconds with supervision   Standing Ubsupported with Feet Together Able to place feet together independently and stand 1 minute safely   From Standing, Reach Forward with Outstretched Arm Can reach confidently >25 cm (10")   From Standing Position, Pick up Object from Floor Able to pick up shoe safely and easily   From Standing Position, Turn to Look Behind Over each Shoulder Looks behind one side only/other side shows less weight shift   Turn 360 Degrees Able to turn 360 degrees safely but slowly   Standing Unsupported, Alternately Place Feet on Step/Stool Able to stand independently and complete 8 steps >20 seconds   Standing Unsupported, One Foot in Front Able to place foot tandem independently and hold 30 seconds   Standing on One Leg Able to lift leg independently and hold equal to or more than 3 seconds   Total Score 47   High Level Balance   High Level Balance Comments 6 minute walk 811ft                            PT Education  - 06/24/14 1614    Education provided Yes   Education Details Education regarding progress with skilled PT services; overall progress towards goals; PT plan of care moving forward   Person(s) Educated Patient   Methods Explanation   Comprehension Verbalized understanding          PT Short Term Goals - 06/24/14 1619    PT SHORT TERM GOAL #1   Title Patient will demonstrate the ability to ambulate at least  742ft with walker in 6 minutes, improved step lengths and upright posture during gait   Baseline 04/05- approx 839ft in 6 minute walk   Time 4   Period Weeks   Status Achieved   PT SHORT TERM GOAL #2   Title Patient will improve Berg balance score to at least 45 in order to reduce overall fall risk   Baseline 4/5- Berg 47   Time 4   Period Weeks   Status Achieved   PT SHORT TERM GOAL #3   Title Patient will demonstrate improved bilateral ankle dorsiflexion to 12 degrees and bilateral hip IR  to at least 35 degrees in order to enhance  balance reaction skills   Time 4   Period Weeks   Status On-going   PT SHORT TERM GOAL #4   Title Patient will demonstrate the ability to maintain single leg stance on each leg for at least 20 seconds   Time 4   Period Weeks   Status On-going           PT Long Term Goals - 06/24/14 1620    PT LONG TERM GOAL #1   Title Patient will be independent in correctly and consistently performing advanced HEP   Time 8   Period Weeks   Status On-going   PT LONG TERM GOAL #2   Title Patient will demonstrate the ability to ambulate at least 1233ft within 6 minutes with walker, improved gait mechanics, upright posture   Time 8   Period Weeks   Status On-going   PT LONG TERM GOAL #3   Title Patient will demonstrate the ability to ambulate over uneven surfaces with LRAD, improved gait mechanics, upright posture, and minimal loss of balance    Time 8   Period Weeks   Status On-going   PT LONG TERM GOAL #4   Title Patient will demonstrate the  ability to maintain SLS on each leg for at least 30 seconds   Time 8   Period Weeks   Status On-going   PT LONG TERM GOAL #5   Title Patient will demonstrate the ability to perform sit to stand without use of hands from surface as low as 14 inches    Time 8   Period Weeks   Status On-going               Plan - 06/24/14 1615    Clinical Impression Statement Re-assessment performed today. Patient shows improvements in strength, hip range of motion, functional activity tolerance, and balance. Improved scores on both the 6 minute walk test as well as the Berg balance test. However patient does continue to complain of frequent dizziness with onset approximately one month ago along with a mild cold and no other related factors; recommend investigating possible BPPV next session.  Patient will benefit from continued skilled PT services in order to continue to address overall strength, hip IR range of motion, functional activity tolerance, dynamic balance and overall balance reaction skills, and to further investigate new onset of dizziness.    Pt will benefit from skilled therapeutic intervention in order to improve on the following deficits Abnormal gait;Decreased endurance;Impaired perceived functional ability;Improper body mechanics;Decreased activity tolerance;Impaired flexibility;Postural dysfunction;Decreased balance;Decreased mobility;Decreased range of motion;Decreased coordination;Decreased safety awareness   Rehab Potential Good   PT Frequency 2x / week   PT Duration 8 weeks   PT Treatment/Interventions ADLs/Self Care Home Management;Gait training;Stair training;Functional mobility training;Patient/family education;Therapeutic activities;Therapeutic exercise;Manual techniques;Energy conservation;DME Instruction;Balance training   PT Next Visit  Plan  PT to test for BPPV, also vertebral artery test. Functional flexiblity and endurance activities; functional mobility tasks.  Alternate with  6 min walking at end of session and nustep for activity tolerance and strengthening.  Continue functional strengthening and balance, ask pt about compliance with eye gaze exercises at home and dizziness.     PT Home Exercise Plan given   Consulted and Agree with Plan of Care Patient        Problem List Patient Active Problem List   Diagnosis Date Noted  . Dizziness   . Malnutrition of moderate degree 05/24/2014  . ICH (intracerebral hemorrhage) 05/23/2014  . HTN (hypertension) 05/19/2014  . Diarrhea 11/25/2013  . Fecal impaction with overflow incontinence of loose stool unlikely infectious 11/25/2013  . Acute kidney injury 11/25/2013  . Aftercare following surgery of the circulatory system, [place] 04/05/2013  . Anemia 12/08/2012  . Protein-calorie malnutrition, severe 12/06/2012  . CKD (chronic kidney disease), stage III 12/06/2012  . Thrombocytopenia, unspecified 12/06/2012  . Fall at home 12/06/2012  . Acute bronchitis 12/06/2012  . UTI (urinary tract infection) 12/06/2012  . Effusion of right knee joint 12/06/2012  . Altered mental status 12/05/2012  . Vascular dementia 12/05/2012  . CAD (coronary artery disease) 10/07/2011  . Chronic systolic CHF (congestive heart failure) 10/07/2011  . Acute respiratory disease 10/03/2011  . Non-STEMI (non-ST elevated myocardial infarction) 10/02/2011  . Hypokalemia 10/02/2011  . Abdominal aneurysm without mention of rupture 04/04/2011  . History of noncompliance with medical treatment   . Cerebrovascular disease   . Adenomatous polyp 10/19/2010  . Hypertensive heart disease without CHF   . Hyperlipidemia   . Chronic kidney disease   . Alcohol abuse   . Tobacco abuse   . Chronic obstructive pulmonary disease 02/09/2010  . History of  abdominal aortic aneurysm (stent graft)    Deniece Ree PT, DPT [place], [place], [address] Phone: [phone]    Fax:  [phone]

## 2014-06-26 ENCOUNTER — Ambulatory Visit (HOSPITAL_COMMUNITY): Payer: Medicare Other | Admitting: Physical Therapy

## 2014-06-26 DIAGNOSIS — R269 Unspecified abnormalities of gait and mobility: Secondary | ICD-10-CM

## 2014-06-26 DIAGNOSIS — R6889 Other general symptoms and signs: Secondary | ICD-10-CM

## 2014-06-26 DIAGNOSIS — I69398 Other sequelae of cerebral infarction: Secondary | ICD-10-CM | POA: Diagnosis not present

## 2014-06-26 DIAGNOSIS — Z7409 Other reduced mobility: Secondary | ICD-10-CM

## 2014-06-26 DIAGNOSIS — I639 Cerebral infarction, unspecified: Secondary | ICD-10-CM

## 2014-06-26 NOTE — Therapy (Signed)
[place], [place], [address] Phone: [phone]   Fax:  [phone]  Physical Therapy Treatment  Patient Details  Name: [name] MRN: [id number] Date of Birth: Nov 29, 1945 Referring Provider:  Garvin Fila, MD  Encounter Date: 06/26/2014      PT End of Session - 06/26/14 1359    Visit Number 6   Number of Visits 16   Date for PT Re-Evaluation 07/24/14   Authorization Type BCBS Medicare   Authorization Time Period 05/28/14 to 07/28/14   Authorization - Visit Number 6   Authorization - Number of Visits 10   PT Start Time 1301   PT Stop Time 1342   PT Time Calculation (min) 41 min   Activity Tolerance Patient tolerated treatment well   Behavior During Therapy [facility] for tasks assessed/performed      Past Medical History  Diagnosis Date  . Hypertension   . COPD (chronic obstructive pulmonary disease)   . Hyperlipidemia   . Cardiomyopathy     EF of 30% per echo in June of 2012; admitted with congestive heart failure; nonobstructive CAD on cath in 08/2010  . History of noncompliance with medical treatment   . Cerebrovascular disease 2011    CVA  in 02/2010-only deficit is decreased vision in  right eye  . Alcohol abuse   . Tobacco abuse     40 pack years  . Abdominal aortic aneurysm     Stent graft repair  . Stroke 2011    decreased vision of right eye  . Chronic systolic CHF (congestive heart failure)   . Leg pain   . CKD (chronic kidney disease) stage 3, GFR 30-59 ml/min     creatinine-1.7 in 08/2010  . CAD (coronary artery disease)   . Myocardial infarction acute 10/01/11  . CKD (chronic kidney disease), stage III 12/06/2012  . Effusion of right knee joint 12/06/2012  . Anemia   . Atrial fibrillation   . Ataxic gait   . Weakness generalized   . Frequent falls   . Forgetfulness     Past Surgical History  Procedure Laterality Date  . Colonoscopy w/ polypectomy  2006    Dr. Tamala Julian: anal  fissure, sessile adenomatous polyp, diverticulosis  . Esophagogastroduodenoscopy  11/15/2010    Procedure: ESOPHAGOGASTRODUODENOSCOPY (EGD);  Surgeon: Dorothyann Peng, MD;  Location: [facility];  Service: Endoscopy;  Laterality: N/A;  with propofol sedation; procedure start @ 0813  . Flexible sigmoidoscopy  11/15/2010    Procedure: FLEXIBLE SIGMOIDOSCOPY;  Surgeon: Dorothyann Peng, MD;  Location: [facility];  Service: Endoscopy;  Laterality: N/A;  with propofol sedation; ended @ 0808  . Polypectomy  12/13/2010    Procedure: POLYPECTOMY;  Surgeon: Dorothyann Peng, MD;  Location: [facility];  Service: Endoscopy;  Laterality: N/A;  right colon, cecal, transverse, and sigmoid polypectomy  . Cardiac catheterization  10/04/11    Normal left main, 95% pLAD stenosis with ulceration s/p successful BMS placement, 30% segmental plaque beyond this, dLAD after D2 with 50% plaquing, then focal 70% lesion, 80% focal short D2 stenosis, 30% pOM2 lesion, 30-40% mOM3 stenosis, Shephard's crook region of RCA with 50% lesion, mRCA 50-60% lesion and mild dRCA irregularities.   . Coronary stent placement  10/04/11  . Abdominal aortic aneurysm repair w/ endoluminal graft      01/16/2008  . Left heart catheterization with coronary angiogram N/A 10/04/2011    Procedure: LEFT HEART CATHETERIZATION WITH CORONARY ANGIOGRAM;  Surgeon:  Hillary Bow, MD;  Location: [facility] CATH LAB;  Service: Cardiovascular;  Laterality: N/A;    There were no vitals filed for this visit.  Visit Diagnosis:  Abnormality of gait  Impaired functional mobility, balance, gait, and endurance  Decreased functional activity tolerance  CVA (cerebral vascular accident)      Subjective Assessment - 06/26/14 1350    Subjective Patient states he is having approximately 5/10 dizziness today, otherwise doing OK   Pertinent History Patient presents with history of strokes; no falls recently, has a history of strokes in the past.    Currently in Pain? No/denies             [facility] PT Assessment - 06/26/14 0001    Observation/Other Assessments   Observations Patient negative for vertebral artery symptomology with cervical extension, cervical rotation, and combined cervical extension/rotation; negative for vertebral artery impingement at this time. However noted reduction of dizziness with cervical rotation to L and taking trunk down to mat table to the R.                    [facility] Adult PT Treatment/Exercise - 06/26/14 0001    Knee/Hip Exercises: Stretches   Gastroc Stretch 3 reps;30 seconds   Gastroc Stretch Limitations slant board   Knee/Hip Exercises: Standing   Heel Raises 10 reps   Heel Raises Limitations slantboard   Forward Lunges Both;10 reps   Forward Lunges Limitations 6 inch step    Forward Step Up Both;1 set;10 reps   Forward Step Up Limitations 4 inches    Gait Training 6 minute walk 759ft              Balance Exercises - 06/26/14 1353    Balance Exercises: Standing   Tandem Gait Forward;2 reps;Other (comment)  approx 36ftx2   Retro Gait 2 reps;Other (comment)  approx 26ft x2           PT Education - 06/26/14 1358    Education provided Yes   Education Details Education regarding possible BPPV and why cannalith repositioning techniques may be helpful   Person(s) Educated Patient   Methods Explanation   Comprehension Verbalized understanding          PT Short Term Goals - 06/24/14 1619    PT SHORT TERM GOAL #1   Title Patient will demonstrate the ability to ambulate at least 778ft with walker in 6 minutes, improved step lengths and upright posture during gait   Baseline 04/05- approx 814ft in 6 minute walk   Time 4   Period Weeks   Status Achieved   PT SHORT TERM GOAL #2   Title Patient will improve Berg balance score to at least 45 in order to reduce overall fall risk   Baseline 4/5- Berg 47   Time 4   Period Weeks   Status Achieved   PT SHORT TERM GOAL #3   Title Patient will demonstrate  improved bilateral ankle dorsiflexion to 12 degrees and bilateral hip IR  to at least 35 degrees in order to enhance  balance reaction skills   Time 4   Period Weeks   Status On-going   PT SHORT TERM GOAL #4   Title Patient will demonstrate the ability to maintain single leg stance on each leg for at least 20 seconds   Time 4   Period Weeks   Status On-going           PT Long Term Goals - 06/24/14 1620  PT LONG TERM GOAL #1   Title Patient will be independent in correctly and consistently performing advanced HEP   Time 8   Period Weeks   Status On-going   PT LONG TERM GOAL #2   Title Patient will demonstrate the ability to ambulate at least 1239ft within 6 minutes with walker, improved gait mechanics, upright posture   Time 8   Period Weeks   Status On-going   PT LONG TERM GOAL #3   Title Patient will demonstrate the ability to ambulate over uneven surfaces with LRAD, improved gait mechanics, upright posture, and minimal loss of balance    Time 8   Period Weeks   Status On-going   PT LONG TERM GOAL #4   Title Patient will demonstrate the ability to maintain SLS on each leg for at least 30 seconds   Time 8   Period Weeks   Status On-going   PT LONG TERM GOAL #5   Title Patient will demonstrate the ability to perform sit to stand without use of hands from surface as low as 14 inches    Time 8   Period Weeks   Status On-going               Plan - 06/26/14 1359    Clinical Impression Statement Patient negative for signs of vertebral artery impingement, however left cervical rotation and right trunk to table did reduce signs of dizziness. Continued functional exercises, stretches, and balance exercises. Patient ambulated approximately 72ft in 6 minutes. Tolerated session today well.    Pt will benefit from skilled therapeutic intervention in order to improve on the following deficits Abnormal gait;Decreased endurance;Impaired perceived functional ability;Improper  body mechanics;Decreased activity tolerance;Impaired flexibility;Postural dysfunction;Decreased balance;Decreased mobility;Decreased range of motion;Decreased coordination;Decreased safety awareness   Rehab Potential Good   PT Frequency 2x / week   PT Duration 8 weeks   PT Treatment/Interventions ADLs/Self Care Home Management;Gait training;Stair training;Functional mobility training;Patient/family education;Therapeutic activities;Therapeutic exercise;Manual techniques;Energy conservation;DME Instruction;Balance training   PT Next Visit Plan Cannalith repositioning techniques. Continue walking/Nustep program, functional exercises, and functional stretches.    PT Home Exercise Plan given   Consulted and Agree with Plan of Care Patient        Problem List Patient Active Problem List   Diagnosis Date Noted  . Dizziness   . Malnutrition of moderate degree 05/24/2014  . ICH (intracerebral hemorrhage) 05/23/2014  . HTN (hypertension) 05/19/2014  . Diarrhea 11/25/2013  . Fecal impaction with overflow incontinence of loose stool unlikely infectious 11/25/2013  . Acute kidney injury 11/25/2013  . Aftercare following surgery of the circulatory system, [place] 04/05/2013  . Anemia 12/08/2012  . Protein-calorie malnutrition, severe 12/06/2012  . CKD (chronic kidney disease), stage III 12/06/2012  . Thrombocytopenia, unspecified 12/06/2012  . Fall at home 12/06/2012  . Acute bronchitis 12/06/2012  . UTI (urinary tract infection) 12/06/2012  . Effusion of right knee joint 12/06/2012  . Altered mental status 12/05/2012  . Vascular dementia 12/05/2012  . CAD (coronary artery disease) 10/07/2011  . Chronic systolic CHF (congestive heart failure) 10/07/2011  . Acute respiratory disease 10/03/2011  . Non-STEMI (non-ST elevated myocardial infarction) 10/02/2011  . Hypokalemia 10/02/2011  . Abdominal aneurysm without mention of rupture 04/04/2011  . History of noncompliance with medical treatment   .  Cerebrovascular disease   . Adenomatous polyp 10/19/2010  . Hypertensive heart disease without CHF   . Hyperlipidemia   . Chronic kidney disease   . Alcohol abuse   . Tobacco abuse   .  Chronic obstructive pulmonary disease 02/09/2010  . History of  abdominal aortic aneurysm (stent graft)     Deniece Ree PT, DPT [place], [place], [address] Phone: [phone]   Fax:  [phone]

## 2014-06-30 ENCOUNTER — Other Ambulatory Visit: Payer: Self-pay | Admitting: Adult Health

## 2014-06-30 ENCOUNTER — Encounter: Payer: Self-pay | Admitting: Vascular Surgery

## 2014-07-01 ENCOUNTER — Ambulatory Visit (HOSPITAL_COMMUNITY): Payer: Medicare Other | Admitting: Physical Therapy

## 2014-07-01 ENCOUNTER — Encounter: Payer: Self-pay | Admitting: Vascular Surgery

## 2014-07-01 ENCOUNTER — Ambulatory Visit (HOSPITAL_COMMUNITY)
Admission: RE | Admit: 2014-07-01 | Discharge: 2014-07-01 | Disposition: A | Discharge status: Home / Self Care | Payer: Medicare Other | Source: Ambulatory Visit | Attending: Vascular Surgery | Admitting: Vascular Surgery

## 2014-07-01 ENCOUNTER — Other Ambulatory Visit: Payer: Self-pay

## 2014-07-01 ENCOUNTER — Ambulatory Visit (INDEPENDENT_AMBULATORY_CARE_PROVIDER_SITE_OTHER): Payer: Medicare Other | Admitting: Vascular Surgery

## 2014-07-01 VITALS — BP 173/87 | HR 50 | Resp 16 | Ht 75.0 in | Wt 172.0 lb

## 2014-07-01 DIAGNOSIS — Z01810 Encounter for preprocedural cardiovascular examination: Secondary | ICD-10-CM

## 2014-07-01 DIAGNOSIS — N185 Chronic kidney disease, stage 5: Secondary | ICD-10-CM | POA: Insufficient documentation

## 2014-07-01 NOTE — Progress Notes (Signed)
Filed Vitals:   07/01/14 1449 07/01/14 1452  BP: 174/77 173/87  Pulse: 52 50  Resp: 16   Height: 6\' 3"  (1.905 m)   Weight: 172 lb (78.019 kg)    Body mass index is 21.5 kg/(m^2).

## 2014-07-01 NOTE — Progress Notes (Signed)
[facility] New Access  Referred by:  Kathyrn Drown, [place], [place] [address]  Reason for referral: New access  History of Present Illness  [name] is a 69 y.o. (Mar 29, 1945) male who presents for evaluation for permanent access.  He is known to the practice as a former EVAR patient of Dr. Stephens Shire (date of surgery: 01/16/2008). His primary nephrologist is Dr. Hinda Lenis who says that the patient is approaching dialysis.  The patient is right hand dominant.  The patient has not had previous access procedures. The patient has never had a pacemaker placed.   He has a history of poorly controlled hypertension, CVA, AAA s/p EVAR, CAD and atrial fibrillation. He is not on any blood thinners.   Past Medical History  Diagnosis Date  . Hypertension   . COPD (chronic obstructive pulmonary disease)   . Hyperlipidemia   . Cardiomyopathy     EF of 30% per echo in June of 2012; admitted with congestive heart failure; nonobstructive CAD on cath in 08/2010  . History of noncompliance with medical treatment   . Cerebrovascular disease 2011    CVA  in 02/2010-only deficit is decreased vision in  right eye  . Alcohol abuse   . Tobacco abuse     40 pack years  . Abdominal aortic aneurysm     Stent graft repair  . Stroke 2011    decreased vision of right eye  . Chronic systolic CHF (congestive heart failure)   . Leg pain   . CKD (chronic kidney disease) stage 3, GFR 30-59 ml/min     creatinine-1.7 in 08/2010  . CAD (coronary artery disease)   . Myocardial infarction acute 10/01/11  . CKD (chronic kidney disease), stage III 12/06/2012  . Effusion of right knee joint 12/06/2012  . Anemia   . Atrial fibrillation   . Ataxic gait   . Weakness generalized   . Frequent falls   . Forgetfulness     Past Surgical History  Procedure Laterality Date  . Colonoscopy w/ polypectomy  2006    Dr. Tamala Julian: anal fissure, sessile adenomatous polyp,  diverticulosis  . Esophagogastroduodenoscopy  11/15/2010    Procedure: ESOPHAGOGASTRODUODENOSCOPY (EGD);  Surgeon: Dorothyann Peng, MD;  Location: [facility];  Service: Endoscopy;  Laterality: N/A;  with propofol sedation; procedure start @ 0813  . Flexible sigmoidoscopy  11/15/2010    Procedure: FLEXIBLE SIGMOIDOSCOPY;  Surgeon: Dorothyann Peng, MD;  Location: [facility];  Service: Endoscopy;  Laterality: N/A;  with propofol sedation; ended @ 0808  . Polypectomy  12/13/2010    Procedure: POLYPECTOMY;  Surgeon: Dorothyann Peng, MD;  Location: [facility];  Service: Endoscopy;  Laterality: N/A;  right colon, cecal, transverse, and sigmoid polypectomy  . Cardiac catheterization  10/04/11    Normal left main, 95% pLAD stenosis with ulceration s/p successful BMS placement, 30% segmental plaque beyond this, dLAD after D2 with 50% plaquing, then focal 70% lesion, 80% focal short D2 stenosis, 30% pOM2 lesion, 30-40% mOM3 stenosis, Shephard's crook region of RCA with 50% lesion, mRCA 50-60% lesion and mild dRCA irregularities.   . Coronary stent placement  10/04/11  . Abdominal aortic aneurysm repair w/ endoluminal graft      01/16/2008  . Left heart catheterization with coronary angiogram N/A 10/04/2011    Procedure: LEFT HEART CATHETERIZATION WITH CORONARY ANGIOGRAM;  Surgeon: Hillary Bow, MD;  Location: [facility] CATH LAB;  Service: Cardiovascular;  Laterality: N/A;  History   Social History  . Marital Status: Married    Spouse Name: N/A  . Number of Children: N/A  . Years of Education: N/A   Occupational History  . Not on file.   Social History Main Topics  . Smoking status: Light Tobacco Smoker -- 0.50 packs/day for 50 years    Types: Cigarettes    Start date: 06/25/1957  . Smokeless tobacco: Never Used  . Alcohol Use: 16.8 oz/week    28 Shots of liquor per week     Comment: quit about 1 month ago, prior to this would drink about 7 oz of liquor per day 10/2012  . Drug Use: No  . Sexual Activity: No    Other Topics Concern  . Not on file   Social History Narrative    Family History  Problem Relation Age of Onset  . Colon cancer Neg Hx   . Anesthesia problems Neg Hx   . Hypotension Neg Hx   . Malignant hyperthermia Neg Hx   . Pseudochol deficiency Neg Hx   . Cancer Mother     Gall Bladder  . Heart disease Mother     before age 73  . Hyperlipidemia Mother   . Hypertension Mother   . Asthma Mother   . Cancer Sister     Cervical    Current Outpatient Prescriptions on File Prior to Visit  Medication Sig Dispense Refill  . amLODipine (NORVASC) 10 MG tablet Take 1 tablet (10 mg total) by mouth daily. 30 tablet 6  . atorvastatin (LIPITOR) 80 MG tablet TAKE 1 TABLET (80 MG TOTAL) BY MOUTH DAILY AT 6 PM. 90 tablet 3  . citalopram (CELEXA) 20 MG tablet Take 10 mg by mouth daily.  5  . cloNIDine (CATAPRES) 0.1 MG tablet Take 1 tablet (0.1 mg total) by mouth 2 (two) times daily. 60 tablet 3  . ezetimibe (ZETIA) 10 MG tablet Take 1 tablet (10 mg total) by mouth daily. 30 tablet 6  . feeding supplement, ENSURE COMPLETE, (ENSURE COMPLETE) LIQD Take 237 mLs by mouth daily at 12 noon. 30 Bottle 3  . hydrALAZINE (APRESOLINE) 25 MG tablet Take 25 mg by mouth 3 (three) times daily.  3  . Multiple Vitamin (MULTIVITAMIN WITH MINERALS) TABS Take 1 tablet by mouth daily.    . pantoprazole (PROTONIX) 40 MG tablet Take 1 tablet (40 mg total) by mouth daily. 30 tablet 11  . polyethylene glycol (MIRALAX / GLYCOLAX) packet Take 17 g by mouth daily. 14 each 0  . VOLTAREN 1 % GEL Apply 4 g topically daily as needed (for knee pain). Bilateral Knee  1  . carvedilol (COREG) 3.125 MG tablet Take 1 tablet (3.125 mg total) by mouth 2 (two) times daily. (Patient not taking: Reported on 07/01/2014) 60 tablet 6  . [DISCONTINUED] metoprolol (TOPROL-XL) 50 MG 24 hr tablet Take 50 mg by mouth daily.      . [DISCONTINUED] niacin (NIASPAN) 500 MG CR tablet Take 500 mg by mouth at bedtime.      . [DISCONTINUED]  omeprazole (PRILOSEC) 20 MG capsule 1 po every morning 30 capsule 5   No current facility-administered medications on file prior to visit.    No Known Allergies  REVIEW OF SYSTEMS:  (Positives checked otherwise negative)  CARDIOVASCULAR:  []  chest pain, []  chest pressure, []  palpitations, []  shortness of breath when laying flat, []  shortness of breath with exertion,  []  pain in feet when walking, []  pain in feet when laying flat, []   history of blood clot in veins (DVT), []  history of phlebitis, []  swelling in legs, []  varicose veins  PULMONARY:  []  productive cough, []  asthma, []  wheezing  NEUROLOGIC:  [x]  weakness in arms or legs, []  numbness in arms or legs, []  difficulty speaking or slurred speech, []  temporary loss of vision in one eye, [x]  dizziness  HEMATOLOGIC:  []  bleeding problems, []  problems with blood clotting too easily  MUSCULOSKEL:  []  joint pain, []  joint swelling  GASTROINTEST:  []  vomiting blood, []  blood in stool     GENITOURINARY:  []  burning with urination, []  blood in urine  PSYCHIATRIC:  []  history of major depression  INTEGUMENTARY:  []  rashes, []  ulcers  CONSTITUTIONAL:  []  fever, []  chills  Physical Examination  Filed Vitals:   07/01/14 1449 07/01/14 1452  BP: 174/77 173/87  Pulse: 52 50  Resp: 16   Height: 6\' 3"  (1.905 m)   Weight: 172 lb (78.019 kg)    Body mass index is 21.5 kg/(m^2).  General: A&O x 3, WD thin male in NAD  Head: [place]/AT  Neck: Supple  Pulmonary: Sym exp, good air movt, CTAB, no rales, rhonchi, & wheezing  Cardiac: RRR, Nl S1, S2, no Murmurs, rubs or gallops  Vascular: 2+ radial and brachial pulses bilaterally.   Musculoskeletal: Extremities without ischemic changes  Neurologic: CN 2-12 grossly intact. Pain and light touch intact in extremities  Psychiatric: Judgment intact, Mood & affect appropriate for pt's clinical situation  Dermatologic: See M/S exam for extremity exam, no rashes otherwise  noted  Non-Invasive Vascular Imaging  Vein Mapping  (Date: 07/01/2014):   R arm: acceptable vein conduits include: none  L arm: acceptable vein conduits include: none  Outside Studies/Documentation 5 pages of outside documents were reviewed including: medical records from Dr. Theador Hawthorne.  Medical Decision Making  [name] is a 69 y.o. male who presents with CKD stage V and is approaching dialysis.   The patient has small caliber veins on exam bilaterally and will require a left forearm AV graft.   I had an extensive discussion with this patient in regards to the nature of access surgery, including risk, benefits, and alternatives.   The patient is aware that the risks of access surgery include but are not limited to: bleeding, infection, steal syndrome, nerve damage, and possible need for additional access procedures in the future.  The patient has agreed to proceed with the above procedure which will be scheduled for 07/07/14 with Dr. Sharalyn Ink, PA-C [facility] [place] Office: [phone] Pager: [phone]  07/01/2014, 3:35 PM  This patient was seen in conjunction with [name]  I have examined the patient, reviewed and agree with above. For AV access. Approaching need for dialysis. Discussed options of AV fistula and AV graft. Also discussed options of hemodialysis catheter for acute dialysis which currently he does not need. Small surface veins bilaterally. Her left arm AV graft on 418  EARLY, TODD, MD 07/01/2014 4:29 PM

## 2014-07-02 ENCOUNTER — Telehealth: Payer: Self-pay | Admitting: Neurology

## 2014-07-02 NOTE — Telephone Encounter (Signed)
Patient's spouse returning [place], [place] call from yesterday regarding additional information needed to complete form.  Please call and advise.

## 2014-07-02 NOTE — Telephone Encounter (Signed)
Form,[employer] received,completed by Dr Leonie Man and Lovey Newcomer mailed to patient 07-02-14.

## 2014-07-02 NOTE — Telephone Encounter (Signed)
I spoke to pts wife.  Filled out.  Signed by [name].  To MR 07-02-14 to copy and mail.

## 2014-07-03 ENCOUNTER — Ambulatory Visit (HOSPITAL_COMMUNITY): Payer: Medicare Other | Admitting: Physical Therapy

## 2014-07-03 ENCOUNTER — Telehealth: Payer: Self-pay | Admitting: *Deleted

## 2014-07-03 DIAGNOSIS — E785 Hyperlipidemia, unspecified: Secondary | ICD-10-CM

## 2014-07-03 DIAGNOSIS — I69398 Other sequelae of cerebral infarction: Secondary | ICD-10-CM | POA: Diagnosis not present

## 2014-07-03 DIAGNOSIS — R6889 Other general symptoms and signs: Secondary | ICD-10-CM

## 2014-07-03 DIAGNOSIS — I639 Cerebral infarction, unspecified: Secondary | ICD-10-CM

## 2014-07-03 DIAGNOSIS — Z7409 Other reduced mobility: Secondary | ICD-10-CM

## 2014-07-03 DIAGNOSIS — R269 Unspecified abnormalities of gait and mobility: Secondary | ICD-10-CM

## 2014-07-03 NOTE — Telephone Encounter (Signed)
Received call back from wife [facility]).  Stated he is getting ready to have AV graft insertion on Monday, 07/07/2014.  Dr. Lowanda Foster & [name] has done the most recent labs.  Informed wife will wait to see these labs & if no lipids done - will notify her by mail with orders.  She verbalized understanding.

## 2014-07-03 NOTE — Telephone Encounter (Signed)
-----   Message from Laurine Blazer, LPN sent at 7/47/3403 10:40 AM EST ----- Regarding: LIPIDS DUE IN 3 MO OV ON 04/17/14 - STARTED ON ZETIA.  MAIL ORDER TO PATIENT.

## 2014-07-03 NOTE — Telephone Encounter (Signed)
Left message to return call 

## 2014-07-03 NOTE — Therapy (Signed)
[place], [place], [address] Phone: [phone]   Fax:  [phone]  Physical Therapy Treatment  Patient Details  Name: [name] MRN: [id number] Date of Birth: 05-13-1945 Referring Provider:  Garvin Fila, MD  Encounter Date: 07/03/2014      PT End of Session - 07/03/14 1515    Visit Number 7   Number of Visits 16   Date for PT Re-Evaluation 07/24/14   Authorization Type BCBS Medicare   Authorization Time Period 05/28/14 to 07/28/14   Authorization - Visit Number 7   Authorization - Number of Visits 10   PT Start Time 1430   PT Stop Time 1513   PT Time Calculation (min) 43 min   Activity Tolerance Patient tolerated treatment well   Behavior During Therapy [facility] for tasks assessed/performed      Past Medical History  Diagnosis Date  . Hypertension   . COPD (chronic obstructive pulmonary disease)   . Hyperlipidemia   . Cardiomyopathy     EF of 30% per echo in June of 2012; admitted with congestive heart failure; nonobstructive CAD on cath in 08/2010  . History of noncompliance with medical treatment   . Cerebrovascular disease 2011    CVA  in 02/2010-only deficit is decreased vision in  right eye  . Alcohol abuse   . Tobacco abuse     40 pack years  . Abdominal aortic aneurysm     Stent graft repair  . Stroke 2011    decreased vision of right eye  . Chronic systolic CHF (congestive heart failure)   . Leg pain   . CKD (chronic kidney disease) stage 3, GFR 30-59 ml/min     creatinine-1.7 in 08/2010  . CAD (coronary artery disease)   . Myocardial infarction acute 10/01/11  . CKD (chronic kidney disease), stage III 12/06/2012  . Effusion of right knee joint 12/06/2012  . Anemia   . Atrial fibrillation   . Ataxic gait   . Weakness generalized   . Frequent falls   . Forgetfulness     Past Surgical History  Procedure Laterality Date  . Colonoscopy w/ polypectomy  2006    Dr. Tamala Julian: anal  fissure, sessile adenomatous polyp, diverticulosis  . Esophagogastroduodenoscopy  11/15/2010    Procedure: ESOPHAGOGASTRODUODENOSCOPY (EGD);  Surgeon: Dorothyann Peng, MD;  Location: [facility];  Service: Endoscopy;  Laterality: N/A;  with propofol sedation; procedure start @ 0813  . Flexible sigmoidoscopy  11/15/2010    Procedure: FLEXIBLE SIGMOIDOSCOPY;  Surgeon: Dorothyann Peng, MD;  Location: [facility];  Service: Endoscopy;  Laterality: N/A;  with propofol sedation; ended @ 0808  . Polypectomy  12/13/2010    Procedure: POLYPECTOMY;  Surgeon: Dorothyann Peng, MD;  Location: [facility];  Service: Endoscopy;  Laterality: N/A;  right colon, cecal, transverse, and sigmoid polypectomy  . Cardiac catheterization  10/04/11    Normal left main, 95% pLAD stenosis with ulceration s/p successful BMS placement, 30% segmental plaque beyond this, dLAD after D2 with 50% plaquing, then focal 70% lesion, 80% focal short D2 stenosis, 30% pOM2 lesion, 30-40% mOM3 stenosis, Shephard's crook region of RCA with 50% lesion, mRCA 50-60% lesion and mild dRCA irregularities.   . Coronary stent placement  10/04/11  . Abdominal aortic aneurysm repair w/ endoluminal graft      01/16/2008  . Left heart catheterization with coronary angiogram N/A 10/04/2011    Procedure: LEFT HEART CATHETERIZATION WITH CORONARY ANGIOGRAM;  Surgeon:  Hillary Bow, MD;  Location: [facility] CATH LAB;  Service: Cardiovascular;  Laterality: N/A;    There were no vitals filed for this visit.  Visit Diagnosis:  CVA (cerebral vascular accident)  Abnormality of gait  Impaired functional mobility, balance, gait, and endurance  Decreased functional activity tolerance      Subjective Assessment - 07/03/14 1439    Subjective Patient states he is doing ok today, still dizzy though    Pertinent History Patient presents with history of strokes; no falls recently, has a history of strokes in the past.    Currently in Pain? No/denies                        [facility] Adult PT Treatment/Exercise - 07/03/14 0001    Knee/Hip Exercises: Stretches   Gastroc Stretch 3 reps;30 seconds   Gastroc Stretch Limitations slant board   Knee/Hip Exercises: Standing   Forward Lunges Both;1 set;15 reps   Forward Lunges Limitations 6 inch box    Side Lunges Both;1 set;10 reps   Side Lunges Limitations 6 inch box    Gait Training 6 minute walk approximatlely 879ft   Knee/Hip Exercises: Seated   Other Seated Knee Exercises Cannalith repositioning with cervical L rotation/R sidelying 1x6 with some improvement in dizziness              Balance Exercises - 07/03/14 1448    Balance Exercises: Standing   Standing Eyes Closed Narrow base of support (BOS);3 reps;20 secs;Solid surface   Wall Bumps --  posterior wall bumps 1x20           PT Education - 07/03/14 1510    Education provided Yes   Education Details Education regarding safe use of assistive device    Person(s) Educated Patient   Methods Explanation   Comprehension Verbalized understanding          PT Short Term Goals - 06/24/14 1619    PT SHORT TERM GOAL #1   Title Patient will demonstrate the ability to ambulate at least 762ft with walker in 6 minutes, improved step lengths and upright posture during gait   Baseline 04/05- approx 839ft in 6 minute walk   Time 4   Period Weeks   Status Achieved   PT SHORT TERM GOAL #2   Title Patient will improve Berg balance score to at least 45 in order to reduce overall fall risk   Baseline 4/5- Berg 47   Time 4   Period Weeks   Status Achieved   PT SHORT TERM GOAL #3   Title Patient will demonstrate improved bilateral ankle dorsiflexion to 12 degrees and bilateral hip IR  to at least 35 degrees in order to enhance  balance reaction skills   Time 4   Period Weeks   Status On-going   PT SHORT TERM GOAL #4   Title Patient will demonstrate the ability to maintain single leg stance on each leg for at least 20 seconds   Time 4   Period Weeks    Status On-going           PT Long Term Goals - 06/24/14 1620    PT LONG TERM GOAL #1   Title Patient will be independent in correctly and consistently performing advanced HEP   Time 8   Period Weeks   Status On-going   PT LONG TERM GOAL #2   Title Patient will demonstrate the ability to ambulate at least 1219ft within 6 minutes  with walker, improved gait mechanics, upright posture   Time 8   Period Weeks   Status On-going   PT LONG TERM GOAL #3   Title Patient will demonstrate the ability to ambulate over uneven surfaces with LRAD, improved gait mechanics, upright posture, and minimal loss of balance    Time 8   Period Weeks   Status On-going   PT LONG TERM GOAL #4   Title Patient will demonstrate the ability to maintain SLS on each leg for at least 30 seconds   Time 8   Period Weeks   Status On-going   PT LONG TERM GOAL #5   Title Patient will demonstrate the ability to perform sit to stand without use of hands from surface as low as 14 inches    Time 8   Period Weeks   Status On-going               Plan - 07/03/14 1516    Clinical Impression Statement Patient returned with dizziness today that was again lessened by cannalith repositioning techniques with L cervical rotation and R sidelying to table on this date. Introduced new functional exercises and balance tasks today with good tolerance by patient. Able to ambulate approximately 810ft today in 6 minute walk test. Appears to be demonstrating improved functional activity tolerance and needed fewer rest breaks on this date.    Pt will benefit from skilled therapeutic intervention in order to improve on the following deficits Abnormal gait;Decreased endurance;Impaired perceived functional ability;Improper body mechanics;Decreased activity tolerance;Impaired flexibility;Postural dysfunction;Decreased balance;Decreased mobility;Decreased range of motion;Decreased coordination;Decreased safety awareness   Rehab  Potential Good   PT Frequency 2x / week   PT Duration 8 weeks   PT Treatment/Interventions ADLs/Self Care Home Management;Gait training;Stair training;Functional mobility training;Patient/family education;Therapeutic activities;Therapeutic exercise;Manual techniques;Energy conservation;DME Instruction;Balance training   PT Next Visit Plan Cannalith repositioning techniques. Continue walking/Nustep program, functional exercises, and functional stretches. Trial activities with quad cane next session.    PT Home Exercise Plan given   Consulted and Agree with Plan of Care Patient        Problem List Patient Active Problem List   Diagnosis Date Noted  . Dizziness   . Malnutrition of moderate degree 05/24/2014  . ICH (intracerebral hemorrhage) 05/23/2014  . HTN (hypertension) 05/19/2014  . Diarrhea 11/25/2013  . Fecal impaction with overflow incontinence of loose stool unlikely infectious 11/25/2013  . Acute kidney injury 11/25/2013  . Aftercare following surgery of the circulatory system, [place] 04/05/2013  . Anemia 12/08/2012  . Protein-calorie malnutrition, severe 12/06/2012  . CKD (chronic kidney disease), stage III 12/06/2012  . Thrombocytopenia, unspecified 12/06/2012  . Fall at home 12/06/2012  . Acute bronchitis 12/06/2012  . UTI (urinary tract infection) 12/06/2012  . Effusion of right knee joint 12/06/2012  . Altered mental status 12/05/2012  . Vascular dementia 12/05/2012  . CAD (coronary artery disease) 10/07/2011  . Chronic systolic CHF (congestive heart failure) 10/07/2011  . Acute respiratory disease 10/03/2011  . Non-STEMI (non-ST elevated myocardial infarction) 10/02/2011  . Hypokalemia 10/02/2011  . Abdominal aneurysm without mention of rupture 04/04/2011  . History of noncompliance with medical treatment   . Cerebrovascular disease   . Adenomatous polyp 10/19/2010  . Hypertensive heart disease without CHF   . Hyperlipidemia   . Chronic kidney disease   . Alcohol  abuse   . Tobacco abuse   . Chronic obstructive pulmonary disease 02/09/2010  . History of  abdominal aortic aneurysm (stent graft)    Cyril Mourning  Hanley Hays, DPT [phone]  [place] [address] [place], [place], [address] Phone: [phone]   Fax:  [phone]

## 2014-07-04 ENCOUNTER — Encounter (HOSPITAL_COMMUNITY): Payer: Self-pay | Admitting: *Deleted

## 2014-07-04 NOTE — Progress Notes (Signed)
Spoke with pt's wife, Chyrese for pre-op call. She is his POA. She states he has not had any recent chest pain or sob.

## 2014-07-06 MED ORDER — DEXTROSE 5 % IV SOLN
1.5000 g | INTRAVENOUS | Status: AC
  Administered 2014-07-07: 1.5 g via INTRAVENOUS
  Filled 2014-07-06: qty 1.5

## 2014-07-06 MED ORDER — CHLORHEXIDINE GLUCONATE CLOTH 2 % EX PADS
6.0000 | MEDICATED_PAD | Freq: Once | CUTANEOUS | Status: DC
Start: 2014-07-06 — End: 2014-07-07

## 2014-07-06 MED ORDER — SODIUM CHLORIDE 0.9 % IV SOLN
INTRAVENOUS | Status: DC
  Administered 2014-07-07: 10:00:00 via INTRAVENOUS

## 2014-07-07 ENCOUNTER — Other Ambulatory Visit: Payer: Self-pay | Admitting: *Deleted

## 2014-07-07 ENCOUNTER — Ambulatory Visit (HOSPITAL_COMMUNITY)
Admission: RE | Admit: 2014-07-07 | Discharge: 2014-07-07 | Disposition: A | Discharge status: Home / Self Care | Payer: Medicare Other | Source: Ambulatory Visit | Attending: Vascular Surgery | Admitting: Vascular Surgery

## 2014-07-07 ENCOUNTER — Ambulatory Visit (HOSPITAL_COMMUNITY): Payer: Medicare Other | Admitting: Certified Registered"

## 2014-07-07 ENCOUNTER — Encounter (HOSPITAL_COMMUNITY)
Admission: RE | Disposition: A | Discharge status: Home / Self Care | Payer: Self-pay | Source: Ambulatory Visit | Attending: Vascular Surgery

## 2014-07-07 ENCOUNTER — Encounter (HOSPITAL_COMMUNITY): Payer: Self-pay | Admitting: Surgery

## 2014-07-07 DIAGNOSIS — N183 Chronic kidney disease, stage 3 (moderate): Secondary | ICD-10-CM | POA: Insufficient documentation

## 2014-07-07 DIAGNOSIS — F17210 Nicotine dependence, cigarettes, uncomplicated: Secondary | ICD-10-CM | POA: Insufficient documentation

## 2014-07-07 DIAGNOSIS — I129 Hypertensive chronic kidney disease with stage 1 through stage 4 chronic kidney disease, or unspecified chronic kidney disease: Secondary | ICD-10-CM | POA: Insufficient documentation

## 2014-07-07 DIAGNOSIS — Z8673 Personal history of transient ischemic attack (TIA), and cerebral infarction without residual deficits: Secondary | ICD-10-CM | POA: Diagnosis not present

## 2014-07-07 DIAGNOSIS — Z955 Presence of coronary angioplasty implant and graft: Secondary | ICD-10-CM | POA: Insufficient documentation

## 2014-07-07 DIAGNOSIS — Z992 Dependence on renal dialysis: Secondary | ICD-10-CM | POA: Insufficient documentation

## 2014-07-07 DIAGNOSIS — I429 Cardiomyopathy, unspecified: Secondary | ICD-10-CM | POA: Diagnosis not present

## 2014-07-07 DIAGNOSIS — I252 Old myocardial infarction: Secondary | ICD-10-CM | POA: Insufficient documentation

## 2014-07-07 DIAGNOSIS — E785 Hyperlipidemia, unspecified: Secondary | ICD-10-CM | POA: Diagnosis not present

## 2014-07-07 DIAGNOSIS — I2510 Atherosclerotic heart disease of native coronary artery without angina pectoris: Secondary | ICD-10-CM | POA: Diagnosis not present

## 2014-07-07 DIAGNOSIS — R270 Ataxia, unspecified: Secondary | ICD-10-CM | POA: Diagnosis not present

## 2014-07-07 DIAGNOSIS — J449 Chronic obstructive pulmonary disease, unspecified: Secondary | ICD-10-CM | POA: Diagnosis not present

## 2014-07-07 DIAGNOSIS — I4891 Unspecified atrial fibrillation: Secondary | ICD-10-CM | POA: Diagnosis not present

## 2014-07-07 DIAGNOSIS — N185 Chronic kidney disease, stage 5: Secondary | ICD-10-CM | POA: Diagnosis not present

## 2014-07-07 DIAGNOSIS — Z86010 Personal history of colonic polyps: Secondary | ICD-10-CM | POA: Insufficient documentation

## 2014-07-07 HISTORY — DX: Anxiety disorder, unspecified: F41.9

## 2014-07-07 HISTORY — DX: Depression, unspecified: F32.A

## 2014-07-07 HISTORY — DX: Unspecified osteoarthritis, unspecified site: M19.90

## 2014-07-07 HISTORY — PX: AV FISTULA PLACEMENT: SHX1204

## 2014-07-07 HISTORY — DX: Major depressive disorder, single episode, unspecified: F32.9

## 2014-07-07 LAB — POCT I-STAT 4, (NA,K, GLUC, HGB,HCT)
Glucose, Bld: 88 mg/dL (ref 70–99)
HCT: 35 % — ABNORMAL LOW (ref 39.0–52.0)
Hemoglobin: 11.9 g/dL — ABNORMAL LOW (ref 13.0–17.0)
POTASSIUM: 4.8 mmol/L (ref 3.5–5.1)
SODIUM: 139 mmol/L (ref 135–145)

## 2014-07-07 SURGERY — ARTERIOVENOUS (AV) FISTULA CREATION
Anesthesia: Monitor Anesthesia Care | Site: Arm Lower | Laterality: Left

## 2014-07-07 MED ORDER — SODIUM CHLORIDE 0.9 % IR SOLN
Status: DC | PRN
  Administered 2014-07-07: 500 mL

## 2014-07-07 MED ORDER — SODIUM CHLORIDE 0.9 % IJ SOLN
INTRAMUSCULAR | Status: AC
  Filled 2014-07-07: qty 10

## 2014-07-07 MED ORDER — OXYCODONE HCL 5 MG PO TABS: 5.0000 mg | ORAL_TABLET | Freq: Once | ORAL | Status: DC | PRN

## 2014-07-07 MED ORDER — 0.9 % SODIUM CHLORIDE (POUR BTL) OPTIME
TOPICAL | Status: DC | PRN
  Administered 2014-07-07: 1000 mL

## 2014-07-07 MED ORDER — MIDAZOLAM HCL 2 MG/2ML IJ SOLN
INTRAMUSCULAR | Status: AC
  Filled 2014-07-07: qty 2

## 2014-07-07 MED ORDER — FENTANYL CITRATE (PF) 100 MCG/2ML IJ SOLN: 25.0000 ug | INTRAMUSCULAR | Status: DC | PRN

## 2014-07-07 MED ORDER — PROPOFOL 10 MG/ML IV BOLUS
INTRAVENOUS | Status: AC
  Filled 2014-07-07: qty 20

## 2014-07-07 MED ORDER — LIDOCAINE-EPINEPHRINE 0.5 %-1:200000 IJ SOLN
INTRAMUSCULAR | Status: AC
  Filled 2014-07-07: qty 1

## 2014-07-07 MED ORDER — LIDOCAINE HCL (CARDIAC) 20 MG/ML IV SOLN
INTRAVENOUS | Status: DC | PRN
  Administered 2014-07-07: 40 mg via INTRAVENOUS

## 2014-07-07 MED ORDER — EPHEDRINE SULFATE 50 MG/ML IJ SOLN
INTRAMUSCULAR | Status: AC
  Filled 2014-07-07: qty 1

## 2014-07-07 MED ORDER — LIDOCAINE HCL (CARDIAC) 20 MG/ML IV SOLN
INTRAVENOUS | Status: AC
  Filled 2014-07-07: qty 10

## 2014-07-07 MED ORDER — PROPOFOL INFUSION 10 MG/ML OPTIME
INTRAVENOUS | Status: DC | PRN
  Administered 2014-07-07: 75 ug/kg/min via INTRAVENOUS

## 2014-07-07 MED ORDER — LIDOCAINE-EPINEPHRINE 0.5 %-1:200000 IJ SOLN
INTRAMUSCULAR | Status: DC | PRN
  Administered 2014-07-07: 10 mL

## 2014-07-07 MED ORDER — FENTANYL CITRATE (PF) 100 MCG/2ML IJ SOLN
INTRAMUSCULAR | Status: DC | PRN
  Administered 2014-07-07: 50 ug via INTRAVENOUS

## 2014-07-07 MED ORDER — PROPOFOL 10 MG/ML IV BOLUS
INTRAVENOUS | Status: DC | PRN
  Administered 2014-07-07: 20 mg via INTRAVENOUS

## 2014-07-07 MED ORDER — FENTANYL CITRATE (PF) 250 MCG/5ML IJ SOLN
INTRAMUSCULAR | Status: AC
  Filled 2014-07-07: qty 5

## 2014-07-07 MED ORDER — OXYCODONE HCL 5 MG PO TABS: 5.0000 mg | ORAL_TABLET | Freq: Four times a day (QID) | ORAL | Status: DC | PRN

## 2014-07-07 MED ORDER — MIDAZOLAM HCL 5 MG/5ML IJ SOLN
INTRAMUSCULAR | Status: DC | PRN
  Administered 2014-07-07: 1 mg via INTRAVENOUS

## 2014-07-07 MED ORDER — OXYCODONE HCL 5 MG/5ML PO SOLN: 5.0000 mg | Freq: Once | ORAL | Status: DC | PRN

## 2014-07-07 MED ORDER — ONDANSETRON HCL 4 MG/2ML IJ SOLN: 4.0000 mg | Freq: Four times a day (QID) | INTRAMUSCULAR | Status: DC | PRN

## 2014-07-07 MED ORDER — LIDOCAINE HCL (CARDIAC) 20 MG/ML IV SOLN
INTRAVENOUS | Status: AC
  Filled 2014-07-07: qty 5

## 2014-07-07 SURGICAL SUPPLY — 35 items
APL SKNCLS STERI-STRIP NONHPOA (GAUZE/BANDAGES/DRESSINGS) ×1
ARMBAND PINK RESTRICT EXTREMIT (MISCELLANEOUS) ×3 IMPLANT
BENZOIN TINCTURE PRP APPL 2/3 (GAUZE/BANDAGES/DRESSINGS) ×3 IMPLANT
CANISTER SUCTION 2500CC (MISCELLANEOUS) ×3 IMPLANT
CANNULA VESSEL 3MM 2 BLNT TIP (CANNULA) IMPLANT
CLIP LIGATING EXTRA MED SLVR (CLIP) ×3 IMPLANT
CLIP LIGATING EXTRA SM BLUE (MISCELLANEOUS) ×3 IMPLANT
CLOSURE WOUND 1/2 X4 (GAUZE/BANDAGES/DRESSINGS) ×1
DECANTER SPIKE VIAL GLASS SM (MISCELLANEOUS) ×3 IMPLANT
ELECT REM PT RETURN 9FT ADLT (ELECTROSURGICAL) ×3
ELECTRODE REM PT RTRN 9FT ADLT (ELECTROSURGICAL) ×1 IMPLANT
GAUZE SPONGE 4X4 12PLY STRL (GAUZE/BANDAGES/DRESSINGS) ×3 IMPLANT
GEL ULTRASOUND 20GR AQUASONIC (MISCELLANEOUS) IMPLANT
GLOVE BIO SURGEON STRL SZ 6.5 (GLOVE) ×1 IMPLANT
GLOVE BIO SURGEONS STRL SZ 6.5 (GLOVE) ×1
GLOVE SS BIOGEL STRL SZ 7.5 (GLOVE) ×1 IMPLANT
GLOVE SUPERSENSE BIOGEL SZ 7.5 (GLOVE) ×2
GLOVE SURG SS PI 6.5 STRL IVOR (GLOVE) ×2 IMPLANT
GLOVE SURG SS PI 7.0 STRL IVOR (GLOVE) ×2 IMPLANT
GOWN STRL REUS W/ TWL LRG LVL3 (GOWN DISPOSABLE) ×3 IMPLANT
GOWN STRL REUS W/TWL LRG LVL3 (GOWN DISPOSABLE) ×9
KIT BASIN OR (CUSTOM PROCEDURE TRAY) ×3 IMPLANT
KIT ROOM TURNOVER OR (KITS) ×3 IMPLANT
NS IRRIG 1000ML POUR BTL (IV SOLUTION) ×3 IMPLANT
PACK CV ACCESS (CUSTOM PROCEDURE TRAY) ×3 IMPLANT
PAD ARMBOARD 7.5X6 YLW CONV (MISCELLANEOUS) ×6 IMPLANT
SPONGE GAUZE 4X4 12PLY STER LF (GAUZE/BANDAGES/DRESSINGS) ×2 IMPLANT
STRIP CLOSURE SKIN 1/2X4 (GAUZE/BANDAGES/DRESSINGS) ×2 IMPLANT
SUT PROLENE 6 0 CC (SUTURE) ×6 IMPLANT
SUT SILK 2 0 FS (SUTURE) IMPLANT
SUT VIC AB 3-0 SH 27 (SUTURE) ×6
SUT VIC AB 3-0 SH 27X BRD (SUTURE) ×2 IMPLANT
TAPE CLOTH SURG 4X10 WHT LF (GAUZE/BANDAGES/DRESSINGS) ×2 IMPLANT
UNDERPAD 30X30 INCONTINENT (UNDERPADS AND DIAPERS) ×3 IMPLANT
WATER STERILE IRR 1000ML POUR (IV SOLUTION) ×3 IMPLANT

## 2014-07-07 NOTE — Anesthesia Procedure Notes (Signed)
Procedure Name: MAC Date/Time: 07/07/2014 11:16 AM Performed by: Melina Copa, Rayburn Mundis R Pre-anesthesia Checklist: Patient identified, Emergency Drugs available, Suction available, Patient being monitored and Timeout performed Patient Re-evaluated:Patient Re-evaluated prior to inductionOxygen Delivery Method: Nasal cannula Placement Confirmation: positive ETCO2 Dental Injury: Teeth and Oropharynx as per pre-operative assessment

## 2014-07-07 NOTE — Op Note (Signed)
    OPERATIVE REPORT  DATE OF SURGERY: 07/07/2014  PATIENT: [name], 69 y.o. male MRN: [id number]  DOB: 05-14-1945  PRE-OPERATIVE DIAGNOSIS: Chronic renal insufficiency  POST-OPERATIVE DIAGNOSIS:  Same  PROCEDURE: Left upper arm AV fistula creation  SURGEON:  Curt Jews, M.D.  PHYSICIAN ASSISTANT: Samantha Rhyne PA-C  ANESTHESIA:  Local with sedation  EBL: Minimal ml  Total I/O In: 200 [I.V.:200] Out: -   BLOOD ADMINISTERED: None  DRAINS: None  SPECIMEN: None  COUNTS CORRECT:  YES  PLAN OF CARE: PACU   PATIENT DISPOSITION:  PACU - hemodynamically stable  PROCEDURE DETAILS: Patient was taken to the operative placed supine position where the area of the left arm prepped in sterile fashion. SonoSite ultrasound was used to visualize the surface veins. The cephalic vein at the antecubital space was of good caliber and was so throughout the upper arm as well. Decision was made to proceed with AV fistula creation. The vein was too small at the level of the wrist.  Using local anesthesia incision made across antecubital space ^ M isolate the cephalic vein. Curvature branches were ligated with 3 or 4 silk ties and divided. The vein was ligated distally and mobilized level of brachial artery. Brachial artery was exposed the same incision. The artery had minimal atherosclerotic change but good caliber. The artery was occluded proximally and distally was opened with 11 blade some ulcerative Potts scissors. The vein was cut to appropriate length and was spatulated and sewn end-to-side to the artery with a running 6-0 Prolene suture. Clamps removed and excellent thrill was noted. Wounds irrigated with saline. Hemostasis tablet cautery. Wounds were closed with 3-0 Vicryl the subcutaneous subcuticular tissue. Patient had an excellent radial pulse which was persistent with the fistula open an occluded with no evidence of steal taken to the case   Curt Jews, M.D. 07/07/2014 12:23  PM

## 2014-07-07 NOTE — Anesthesia Preprocedure Evaluation (Addendum)
Anesthesia Evaluation  Patient identified by MRN, date of birth, ID band Patient awake    Reviewed: Allergy & Precautions, NPO status , Patient's Chart, lab work & pertinent test results  Airway Mallampati: I  TM Distance: >3 FB Neck ROM: Full    Dental  (+) Edentulous Upper, Edentulous Lower   Pulmonary COPDformer smoker,  breath sounds clear to auscultation        Cardiovascular hypertension, Pt. on medications and Pt. on home beta blockers + CAD, + Past MI, + Cardiac Stents, + Peripheral Vascular Disease and +CHF Rhythm:regular Rate:Normal     Neuro/Psych Anxiety Depression    GI/Hepatic   Endo/Other    Renal/GU ESRFRenal disease     Musculoskeletal  (+) Arthritis -,   Abdominal   Peds  Hematology   Anesthesia Other Findings   Reproductive/Obstetrics                            Anesthesia Physical Anesthesia Plan  ASA: III  Anesthesia Plan: MAC   Post-op Pain Management:    Induction: Intravenous  Airway Management Planned: Nasal Cannula  Additional Equipment: None  Intra-op Plan:   Post-operative Plan:   Informed Consent: I have reviewed the patients History and Physical, chart, labs and discussed the procedure including the risks, benefits and alternatives for the proposed anesthesia with the patient or authorized representative who has indicated his/her understanding and acceptance.   Dental advisory given  Plan Discussed with: CRNA, Anesthesiologist and Surgeon  Anesthesia Plan Comments:        Anesthesia Quick Evaluation

## 2014-07-07 NOTE — Transfer of Care (Signed)
Immediate Anesthesia Transfer of Care Note  Patient: [name]  Procedure(s) Performed: Procedure(s): Creation of Left Arm ARTERIOVENOUS Fistula (Left)  Patient Location: PACU  Anesthesia Type:MAC  Level of Consciousness: awake  Airway & Oxygen Therapy: Patient Spontanous Breathing  Post-op Assessment: Report given to RN, Post -op Vital signs reviewed and stable and Patient moving all extremities  Post vital signs: Reviewed and stable  Last Vitals:  Filed Vitals:   07/07/14 0938  BP: 192/76  Pulse: 54  Temp: 36.2 C  Resp: 16    Complications: No apparent anesthesia complications

## 2014-07-07 NOTE — Interval H&P Note (Signed)
History and Physical Interval Note:  07/07/2014 11:03 AM  [name]  has presented today for surgery, with the diagnosis of  Stage V chronic kidney disease N18.5  The various methods of treatment have been discussed with the patient and family. After consideration of risks, benefits and other options for treatment, the patient has consented to  Procedure(s): INSERTION OF ARTERIOVENOUS (AV) GORE-TEX GRAFT ARM (Left) as a surgical intervention .  The patient's history has been reviewed, patient examined, no change in status, stable for surgery.  I have reviewed the patient's chart and labs.  Questions were answered to the patient's satisfaction.     EARLY, TODD

## 2014-07-07 NOTE — H&P (View-Only) (Signed)
[facility] New Access  Referred by:  Kathyrn Drown, [address], [place] [address]  Reason for referral: New access  History of Present Illness  [name] is a 69 y.o. (May 31, 1945) male who presents for evaluation for permanent access.  He is known to the practice as a former EVAR patient of Dr. Stephens Shire (date of surgery: 01/16/2008). His primary nephrologist is Dr. Hinda Lenis who says that the patient is approaching dialysis.  The patient is right hand dominant.  The patient has not had previous access procedures. The patient has never had a pacemaker placed.   He has a history of poorly controlled hypertension, CVA, AAA s/p EVAR, CAD and atrial fibrillation. He is not on any blood thinners.   Past Medical History  Diagnosis Date  . Hypertension   . COPD (chronic obstructive pulmonary disease)   . Hyperlipidemia   . Cardiomyopathy     EF of 30% per echo in June of 2012; admitted with congestive heart failure; nonobstructive CAD on cath in 08/2010  . History of noncompliance with medical treatment   . Cerebrovascular disease 2011    CVA  in 02/2010-only deficit is decreased vision in  right eye  . Alcohol abuse   . Tobacco abuse     40 pack years  . Abdominal aortic aneurysm     Stent graft repair  . Stroke 2011    decreased vision of right eye  . Chronic systolic CHF (congestive heart failure)   . Leg pain   . CKD (chronic kidney disease) stage 3, GFR 30-59 ml/min     creatinine-1.7 in 08/2010  . CAD (coronary artery disease)   . Myocardial infarction acute 10/01/11  . CKD (chronic kidney disease), stage III 12/06/2012  . Effusion of right knee joint 12/06/2012  . Anemia   . Atrial fibrillation   . Ataxic gait   . Weakness generalized   . Frequent falls   . Forgetfulness     Past Surgical History  Procedure Laterality Date  . Colonoscopy w/ polypectomy  2006    Dr. Tamala Julian: anal fissure, sessile adenomatous polyp,  diverticulosis  . Esophagogastroduodenoscopy  11/15/2010    Procedure: ESOPHAGOGASTRODUODENOSCOPY (EGD);  Surgeon: Dorothyann Peng, MD;  Location: [facility];  Service: Endoscopy;  Laterality: N/A;  with propofol sedation; procedure start @ 0813  . Flexible sigmoidoscopy  11/15/2010    Procedure: FLEXIBLE SIGMOIDOSCOPY;  Surgeon: Dorothyann Peng, MD;  Location: [facility];  Service: Endoscopy;  Laterality: N/A;  with propofol sedation; ended @ 0808  . Polypectomy  12/13/2010    Procedure: POLYPECTOMY;  Surgeon: Dorothyann Peng, MD;  Location: [facility];  Service: Endoscopy;  Laterality: N/A;  right colon, cecal, transverse, and sigmoid polypectomy  . Cardiac catheterization  10/04/11    Normal left main, 95% pLAD stenosis with ulceration s/p successful BMS placement, 30% segmental plaque beyond this, dLAD after D2 with 50% plaquing, then focal 70% lesion, 80% focal short D2 stenosis, 30% pOM2 lesion, 30-40% mOM3 stenosis, Shephard's crook region of RCA with 50% lesion, mRCA 50-60% lesion and mild dRCA irregularities.   . Coronary stent placement  10/04/11  . Abdominal aortic aneurysm repair w/ endoluminal graft      01/16/2008  . Left heart catheterization with coronary angiogram N/A 10/04/2011    Procedure: LEFT HEART CATHETERIZATION WITH CORONARY ANGIOGRAM;  Surgeon: Hillary Bow, MD;  Location: [facility] CATH LAB;  Service: Cardiovascular;  Laterality: N/A;  History   Social History  . Marital Status: Married    Spouse Name: N/A  . Number of Children: N/A  . Years of Education: N/A   Occupational History  . Not on file.   Social History Main Topics  . Smoking status: Light Tobacco Smoker -- 0.50 packs/day for 50 years    Types: Cigarettes    Start date: 06/25/1957  . Smokeless tobacco: Never Used  . Alcohol Use: 16.8 oz/week    28 Shots of liquor per week     Comment: quit about 1 month ago, prior to this would drink about 7 oz of liquor per day 10/2012  . Drug Use: No  . Sexual Activity: No    Other Topics Concern  . Not on file   Social History Narrative    Family History  Problem Relation Age of Onset  . Colon cancer Neg Hx   . Anesthesia problems Neg Hx   . Hypotension Neg Hx   . Malignant hyperthermia Neg Hx   . Pseudochol deficiency Neg Hx   . Cancer Mother     Gall Bladder  . Heart disease Mother     before age 18  . Hyperlipidemia Mother   . Hypertension Mother   . Asthma Mother   . Cancer Sister     Cervical    Current Outpatient Prescriptions on File Prior to Visit  Medication Sig Dispense Refill  . amLODipine (NORVASC) 10 MG tablet Take 1 tablet (10 mg total) by mouth daily. 30 tablet 6  . atorvastatin (LIPITOR) 80 MG tablet TAKE 1 TABLET (80 MG TOTAL) BY MOUTH DAILY AT 6 PM. 90 tablet 3  . citalopram (CELEXA) 20 MG tablet Take 10 mg by mouth daily.  5  . cloNIDine (CATAPRES) 0.1 MG tablet Take 1 tablet (0.1 mg total) by mouth 2 (two) times daily. 60 tablet 3  . ezetimibe (ZETIA) 10 MG tablet Take 1 tablet (10 mg total) by mouth daily. 30 tablet 6  . feeding supplement, ENSURE COMPLETE, (ENSURE COMPLETE) LIQD Take 237 mLs by mouth daily at 12 noon. 30 Bottle 3  . hydrALAZINE (APRESOLINE) 25 MG tablet Take 25 mg by mouth 3 (three) times daily.  3  . Multiple Vitamin (MULTIVITAMIN WITH MINERALS) TABS Take 1 tablet by mouth daily.    . pantoprazole (PROTONIX) 40 MG tablet Take 1 tablet (40 mg total) by mouth daily. 30 tablet 11  . polyethylene glycol (MIRALAX / GLYCOLAX) packet Take 17 g by mouth daily. 14 each 0  . VOLTAREN 1 % GEL Apply 4 g topically daily as needed (for knee pain). Bilateral Knee  1  . carvedilol (COREG) 3.125 MG tablet Take 1 tablet (3.125 mg total) by mouth 2 (two) times daily. (Patient not taking: Reported on 07/01/2014) 60 tablet 6  . [DISCONTINUED] metoprolol (TOPROL-XL) 50 MG 24 hr tablet Take 50 mg by mouth daily.      . [DISCONTINUED] niacin (NIASPAN) 500 MG CR tablet Take 500 mg by mouth at bedtime.      . [DISCONTINUED]  omeprazole (PRILOSEC) 20 MG capsule 1 po every morning 30 capsule 5   No current facility-administered medications on file prior to visit.    No Known Allergies  REVIEW OF SYSTEMS:  (Positives checked otherwise negative)  CARDIOVASCULAR:  []  chest pain, []  chest pressure, []  palpitations, []  shortness of breath when laying flat, []  shortness of breath with exertion,  []  pain in feet when walking, []  pain in feet when laying flat, []   history of blood clot in veins (DVT), []  history of phlebitis, []  swelling in legs, []  varicose veins  PULMONARY:  []  productive cough, []  asthma, []  wheezing  NEUROLOGIC:  [x]  weakness in arms or legs, []  numbness in arms or legs, []  difficulty speaking or slurred speech, []  temporary loss of vision in one eye, [x]  dizziness  HEMATOLOGIC:  []  bleeding problems, []  problems with blood clotting too easily  MUSCULOSKEL:  []  joint pain, []  joint swelling  GASTROINTEST:  []  vomiting blood, []  blood in stool     GENITOURINARY:  []  burning with urination, []  blood in urine  PSYCHIATRIC:  []  history of major depression  INTEGUMENTARY:  []  rashes, []  ulcers  CONSTITUTIONAL:  []  fever, []  chills  Physical Examination  Filed Vitals:   07/01/14 1449 07/01/14 1452  BP: 174/77 173/87  Pulse: 52 50  Resp: 16   Height: 6\' 3"  (1.905 m)   Weight: 172 lb (78.019 kg)    Body mass index is 21.5 kg/(m^2).  General: A&O x 3, WD thin male in NAD  Head: [place]/AT  Neck: Supple  Pulmonary: Sym exp, good air movt, CTAB, no rales, rhonchi, & wheezing  Cardiac: RRR, Nl S1, S2, no Murmurs, rubs or gallops  Vascular: 2+ radial and brachial pulses bilaterally.   Musculoskeletal: Extremities without ischemic changes  Neurologic: CN 2-12 grossly intact. Pain and light touch intact in extremities  Psychiatric: Judgment intact, Mood & affect appropriate for pt's clinical situation  Dermatologic: See M/S exam for extremity exam, no rashes otherwise  noted  Non-Invasive Vascular Imaging  Vein Mapping  (Date: 07/01/2014):   R arm: acceptable vein conduits include: none  L arm: acceptable vein conduits include: none  Outside Studies/Documentation 5 pages of outside documents were reviewed including: medical records from Dr. Theador Hawthorne.  Medical Decision Making  ARYAV WIMBERLY is a 68 y.o. male who presents with CKD stage V and is approaching dialysis.   The patient has small caliber veins on exam bilaterally and will require a left forearm AV graft.   I had an extensive discussion with this patient in regards to the nature of access surgery, including risk, benefits, and alternatives.   The patient is aware that the risks of access surgery include but are not limited to: bleeding, infection, steal syndrome, nerve damage, and possible need for additional access procedures in the future.  The patient has agreed to proceed with the above procedure which will be scheduled for 07/07/14 with Dr. Sharalyn Ink, PA-C [facility] [place] Office: [phone] Pager: [phone]  07/01/2014, 3:35 PM  This patient was seen in conjunction with [name]  I have examined the patient, reviewed and agree with above. For AV access. Approaching need for dialysis. Discussed options of AV fistula and AV graft. Also discussed options of hemodialysis catheter for acute dialysis which currently he does not need. Small surface veins bilaterally. Her left arm AV graft on 418  Giovanni Bath, MD 07/01/2014 4:29 PM

## 2014-07-07 NOTE — Discharge Instructions (Signed)
° ° °  07/07/2014 ZYON ROSSER [id number] [birth year]  Surgeon(s): Rosetta Posner, MD  Procedure(s): Creation of Left Arm ARTERIOVENOUS Fistula  x Do not stick graft for 12 weeks

## 2014-07-08 ENCOUNTER — Ambulatory Visit (HOSPITAL_COMMUNITY): Payer: Medicare Other

## 2014-07-08 ENCOUNTER — Telehealth: Payer: Self-pay | Admitting: Vascular Surgery

## 2014-07-08 NOTE — Anesthesia Postprocedure Evaluation (Signed)
Anesthesia Post Note  Patient: [name]  Procedure(s) Performed: Procedure(s) (LRB): Creation of Left Arm ARTERIOVENOUS Fistula (Left)  Anesthesia type: MAC  Patient location: PACU  Post pain: Pain level controlled and Adequate analgesia  Post assessment: Post-op Vital signs reviewed, Patient's Cardiovascular Status Stable and Respiratory Function Stable  Last Vitals:  Filed Vitals:   07/07/14 1330  BP: 156/74  Pulse: 57  Temp:   Resp: 20    Post vital signs: Reviewed and stable  Level of consciousness: awake, alert  and oriented  Complications: No apparent anesthesia complications

## 2014-07-08 NOTE — Telephone Encounter (Signed)
LM for pt re appt, dpm °

## 2014-07-08 NOTE — Telephone Encounter (Signed)
-----   Message from Mena Goes, RN sent at 07/07/2014 12:41 PM EDT ----- Regarding: Schedule I couldn't put lab in yet; waiting on things to get fixed.   ----- Message -----    From: Gabriel Earing, PA-C    Sent: 07/07/2014  12:11 PM      To: Vvs Charge Pool  S/p left BC AVF 07/07/14.  F/u with [name] in 4 weeks with duplex.  Thanks, Aldona Bar

## 2014-07-09 ENCOUNTER — Encounter (HOSPITAL_COMMUNITY): Payer: Self-pay | Admitting: Vascular Surgery

## 2014-07-10 ENCOUNTER — Ambulatory Visit (HOSPITAL_COMMUNITY): Payer: Medicare Other | Admitting: Physical Therapy

## 2014-07-10 ENCOUNTER — Encounter: Payer: Self-pay | Admitting: *Deleted

## 2014-07-10 NOTE — Addendum Note (Signed)
Addended by: Laurine Blazer on: 07/10/2014 12:50 PM   Modules accepted: Orders

## 2014-07-10 NOTE — Telephone Encounter (Signed)
After review of recent labs, no lipids were done.  Will mail order to home as discussed with wife in previous phone conversation.

## 2014-07-15 ENCOUNTER — Ambulatory Visit (HOSPITAL_COMMUNITY): Payer: Medicare Other

## 2014-07-15 DIAGNOSIS — I639 Cerebral infarction, unspecified: Secondary | ICD-10-CM

## 2014-07-15 DIAGNOSIS — Z7409 Other reduced mobility: Secondary | ICD-10-CM

## 2014-07-15 DIAGNOSIS — R269 Unspecified abnormalities of gait and mobility: Secondary | ICD-10-CM

## 2014-07-15 DIAGNOSIS — I69398 Other sequelae of cerebral infarction: Secondary | ICD-10-CM | POA: Diagnosis not present

## 2014-07-15 DIAGNOSIS — R6889 Other general symptoms and signs: Secondary | ICD-10-CM

## 2014-07-15 NOTE — Therapy (Signed)
[name], [place], [address] Phone: [phone]   Fax:  [phone]  Physical Therapy Treatment  Patient Details  Name: [name] MRN: [id number] Date of Birth: 10-Jul-1945 Referring Provider:  Kathyrn Drown, MD  Encounter Date: 07/15/2014      PT End of Session - 07/15/14 1328    Visit Number 8   Number of Visits 16   Date for PT Re-Evaluation 07/24/14   Authorization Type BCBS Medicare   Authorization Time Period 05/28/14 to 07/28/14   Authorization - Visit Number 8   Authorization - Number of Visits 10   PT Start Time 1300   PT Stop Time 7253   PT Time Calculation (min) 53 min   Equipment Utilized During Treatment Gait belt   Activity Tolerance Patient limited by fatigue;Patient tolerated treatment well   Behavior During Therapy [facility] for tasks assessed/performed      Past Medical History  Diagnosis Date  . Hypertension   . COPD (chronic obstructive pulmonary disease)   . Hyperlipidemia   . Cardiomyopathy     EF of 30% per echo in June of 2012; admitted with congestive heart failure; nonobstructive CAD on cath in 08/2010  . History of noncompliance with medical treatment   . Cerebrovascular disease 2011    CVA  in 02/2010-only deficit is decreased vision in  right eye  . Alcohol abuse   . Tobacco abuse     40 pack years  . Abdominal aortic aneurysm     Stent graft repair  . Chronic systolic CHF (congestive heart failure)   . Leg pain   . CKD (chronic kidney disease) stage 3, GFR 30-59 ml/min     creatinine-1.7 in 08/2010  . CAD (coronary artery disease)   . Myocardial infarction acute 10/01/11  . CKD (chronic kidney disease), stage III 12/06/2012  . Effusion of right knee joint 12/06/2012  . Anemia   . Atrial fibrillation   . Ataxic gait   . Weakness generalized   . Frequent falls   . Forgetfulness   . Stroke 2011    decreased vision of right eye    Most recent 05/23/14 - bleed - has a  drag to his left leg  . Depression   . Anxiety     Takes Citalopram  . Arthritis     knees    Past Surgical History  Procedure Laterality Date  . Colonoscopy w/ polypectomy  2006    Dr. Tamala Julian: anal fissure, sessile adenomatous polyp, diverticulosis  . Esophagogastroduodenoscopy  11/15/2010    Procedure: ESOPHAGOGASTRODUODENOSCOPY (EGD);  Surgeon: Dorothyann Peng, MD;  Location: [facility];  Service: Endoscopy;  Laterality: N/A;  with propofol sedation; procedure start @ 0813  . Flexible sigmoidoscopy  11/15/2010    Procedure: FLEXIBLE SIGMOIDOSCOPY;  Surgeon: Dorothyann Peng, MD;  Location: [facility];  Service: Endoscopy;  Laterality: N/A;  with propofol sedation; ended @ 0808  . Polypectomy  12/13/2010    Procedure: POLYPECTOMY;  Surgeon: Dorothyann Peng, MD;  Location: [facility];  Service: Endoscopy;  Laterality: N/A;  right colon, cecal, transverse, and sigmoid polypectomy  . Cardiac catheterization  10/04/11    Normal left main, 95% pLAD stenosis with ulceration s/p successful BMS placement, 30% segmental plaque beyond this, dLAD after D2 with 50% plaquing, then focal 70% lesion, 80% focal short D2 stenosis, 30% pOM2 lesion, 30-40% mOM3 stenosis, Shephard's crook region of RCA with 50% lesion, mRCA 50-60% lesion and  mild dRCA irregularities.   . Coronary stent placement  10/04/11  . Abdominal aortic aneurysm repair w/ endoluminal graft      01/16/2008  . Left heart catheterization with coronary angiogram N/A 10/04/2011    Procedure: LEFT HEART CATHETERIZATION WITH CORONARY ANGIOGRAM;  Surgeon: Hillary Bow, MD;  Location: [facility] CATH [facility];  Service: Cardiovascular;  Laterality: N/A;  . Av fistula placement Left 07/07/2014    Procedure: Creation of Left Arm ARTERIOVENOUS Fistula;  Surgeon: Rosetta Posner, MD;  Location: [place];  Service: Vascular;  Laterality: Left;    There were no vitals filed for this visit.  Visit Diagnosis:  Abnormality of gait  Impaired functional mobility, balance, gait, and  endurance  Decreased functional activity tolerance  CVA (cerebral vascular accident)      Subjective Assessment - 07/15/14 1306    Subjective Pt  with Arteriovenous fistula surgery last week, stated his Lt arm hurts only if he moves it   Currently in Pain? Yes   Pain Score 3    Pain Location Arm   Pain Orientation Left   Pain Descriptors / Indicators Constant;Sore   Pain Type Surgical pain                 [facility] Adult PT Treatment/Exercise - 07/15/14 0001    Exercises   Exercises Knee/Hip   Knee/Hip Exercises: Stretches   Gastroc Stretch 3 reps;30 seconds   Gastroc Stretch Limitations slant board   Knee/Hip Exercises: Standing   Forward Lunges Both;1 set;15 reps   Forward Lunges Limitations 6 inch box    Side Lunges Both;1 set;10 reps   Side Lunges Limitations 6 inch box    Functional Squat 15 reps   Gait Training 6 minute walk approximatlely 613ft   Knee/Hip Exercises: Seated   Other Seated Knee Exercises Cannalith repositioning with cervical L rotation/R sidelying 1x6 with some improvement in dizziness    Other Seated Knee Exercises 2 sets x 5 reps STS no HHA                PT Education - 07/15/14 1412    Education provided Yes   Education Details Advised ot drink water and ankle pumps before getting up following manual cannalith repositioning   Person(s) Educated Patient   Methods Explanation   Comprehension Verbalized understanding          PT Short Term Goals - 07/15/14 1407    PT SHORT TERM GOAL #1   Title Patient will demonstrate the ability to ambulate at least 73ft with walker in 6 minutes, improved step lengths and upright posture during gait   Status Achieved   PT SHORT TERM GOAL #2   Title Patient will improve Berg balance score to at least 45 in order to reduce overall fall risk   Status Achieved   PT SHORT TERM GOAL #3   Title Patient will demonstrate improved bilateral ankle dorsiflexion to 12 degrees and bilateral hip IR  to at  least 35 degrees in order to enhance  balance reaction skills   Status On-going   PT SHORT TERM GOAL #4   Title Patient will demonstrate the ability to maintain single leg stance on each leg for at least 20 seconds   Status On-going           PT Long Term Goals - 07/15/14 1408    PT LONG TERM GOAL #1   Title Patient will be independent in correctly and consistently performing advanced HEP  Status On-going   PT LONG TERM GOAL #2   Title Patient will demonstrate the ability to ambulate at least 1212ft within 6 minutes with walker, improved gait mechanics, upright posture   Status On-going   PT LONG TERM GOAL #3   Title Patient will demonstrate the ability to ambulate over uneven surfaces with LRAD, improved gait mechanics, upright posture, and minimal loss of balance    PT LONG TERM GOAL #4   Title Patient will demonstrate the ability to maintain SLS on each leg for at least 30 seconds   PT LONG TERM GOAL #5   Title Patient will demonstrate the ability to perform sit to stand without use of hands from surface as low as 14 inches    Status On-going               Plan - 07/15/14 1329    Clinical Impression Statement Held gait training with quad cane this session due to c/o dizziness.  Deniece Ree, DPT completed the cannalith repositioning techniques with reports of dizziness reduced.  BP taken in Rt arm secondary to edema in Lt UE.  Seated BP 160/80, standing 147/80 prior cannalith repositioning.  Pt was advised to drink water and complete ankle pumps prior gettiing up.  Pt limited by fatigue this session, able to ambulate 668 feet in 6 minutes with RW with SBA this session, no rest breaks required.     PT Next Visit Plan Cannalith repositioning techniques. Continue walking/Nustep program, functional exercises, and functional stretches. Trial activities with quad cane next session.         Problem List Patient Active Problem List   Diagnosis Date Noted  . Dizziness   .  Malnutrition of moderate degree 05/24/2014  . ICH (intracerebral hemorrhage) 05/23/2014  . HTN (hypertension) 05/19/2014  . Diarrhea 11/25/2013  . Fecal impaction with overflow incontinence of loose stool unlikely infectious 11/25/2013  . Acute kidney injury 11/25/2013  . Aftercare following surgery of the circulatory system, [place] 04/05/2013  . Anemia 12/08/2012  . Protein-calorie malnutrition, severe 12/06/2012  . CKD (chronic kidney disease), stage III 12/06/2012  . Thrombocytopenia, unspecified 12/06/2012  . Fall at home 12/06/2012  . Acute bronchitis 12/06/2012  . UTI (urinary tract infection) 12/06/2012  . Effusion of right knee joint 12/06/2012  . Altered mental status 12/05/2012  . Vascular dementia 12/05/2012  . CAD (coronary artery disease) 10/07/2011  . Chronic systolic CHF (congestive heart failure) 10/07/2011  . Acute respiratory disease 10/03/2011  . Non-STEMI (non-ST elevated myocardial infarction) 10/02/2011  . Hypokalemia 10/02/2011  . Abdominal aneurysm without mention of rupture 04/04/2011  . History of noncompliance with medical treatment   . Cerebrovascular disease   . Adenomatous polyp 10/19/2010  . Hypertensive heart disease without CHF   . Hyperlipidemia   . Chronic kidney disease   . Alcohol abuse   . Tobacco abuse   . Chronic obstructive pulmonary disease 02/09/2010  . History of  abdominal aortic aneurysm (stent graft)    Ihor Austin, LPTA; [place] #[id number] [phone]  Aldona Lento 07/15/2014, 2:14 PM  [place], [place], [address] Phone: [phone]   Fax:  [phone]

## 2014-07-16 ENCOUNTER — Encounter: Payer: Self-pay | Admitting: Gastroenterology

## 2014-07-17 ENCOUNTER — Ambulatory Visit (HOSPITAL_COMMUNITY): Payer: Medicare Other | Admitting: Physical Therapy

## 2014-07-17 DIAGNOSIS — R6889 Other general symptoms and signs: Secondary | ICD-10-CM

## 2014-07-17 DIAGNOSIS — R269 Unspecified abnormalities of gait and mobility: Secondary | ICD-10-CM

## 2014-07-17 DIAGNOSIS — I69398 Other sequelae of cerebral infarction: Secondary | ICD-10-CM | POA: Diagnosis not present

## 2014-07-17 DIAGNOSIS — I639 Cerebral infarction, unspecified: Secondary | ICD-10-CM

## 2014-07-17 DIAGNOSIS — Z7409 Other reduced mobility: Secondary | ICD-10-CM

## 2014-07-17 NOTE — Therapy (Signed)
[place], [place], [address] Phone: [phone]   Fax:  [phone]  Physical Therapy Treatment  Patient Details  Name: [name] MRN: [id number] Date of Birth: 10-29-1945 Referring Provider:  Kathyrn Drown, MD  Encounter Date: 07/17/2014      PT End of Session - 07/17/14 1633    Visit Number 9   Number of Visits 16   Date for PT Re-Evaluation 07/24/14   Authorization Type BCBS Medicare   Authorization Time Period 05/28/14 to 07/28/14   Authorization - Visit Number 9   Authorization - Number of Visits 10   PT Start Time 3976   PT Stop Time 1432   PT Time Calculation (min) 45 min   Activity Tolerance Patient limited by fatigue;Patient tolerated treatment well   Behavior During Therapy [facility] for tasks assessed/performed      Past Medical History  Diagnosis Date  . Hypertension   . COPD (chronic obstructive pulmonary disease)   . Hyperlipidemia   . Cardiomyopathy     EF of 30% per echo in June of 2012; admitted with congestive heart failure; nonobstructive CAD on cath in 08/2010  . History of noncompliance with medical treatment   . Cerebrovascular disease 2011    CVA  in 02/2010-only deficit is decreased vision in  right eye  . Alcohol abuse   . Tobacco abuse     40 pack years  . Abdominal aortic aneurysm     Stent graft repair  . Chronic systolic CHF (congestive heart failure)   . Leg pain   . CKD (chronic kidney disease) stage 3, GFR 30-59 ml/min     creatinine-1.7 in 08/2010  . CAD (coronary artery disease)   . Myocardial infarction acute 10/01/11  . CKD (chronic kidney disease), stage III 12/06/2012  . Effusion of right knee joint 12/06/2012  . Anemia   . Atrial fibrillation   . Ataxic gait   . Weakness generalized   . Frequent falls   . Forgetfulness   . Stroke 2011    decreased vision of right eye    Most recent 05/23/14 - bleed - has a drag to his left leg  . Depression   . Anxiety      Takes Citalopram  . Arthritis     knees    Past Surgical History  Procedure Laterality Date  . Colonoscopy w/ polypectomy  2006    Dr. Tamala Julian: anal fissure, sessile adenomatous polyp, diverticulosis  . Esophagogastroduodenoscopy  11/15/2010    Procedure: ESOPHAGOGASTRODUODENOSCOPY (EGD);  Surgeon: Dorothyann Peng, MD;  Location: [facility];  Service: Endoscopy;  Laterality: N/A;  with propofol sedation; procedure start @ 0813  . Flexible sigmoidoscopy  11/15/2010    Procedure: FLEXIBLE SIGMOIDOSCOPY;  Surgeon: Dorothyann Peng, MD;  Location: [facility];  Service: Endoscopy;  Laterality: N/A;  with propofol sedation; ended @ 0808  . Polypectomy  12/13/2010    Procedure: POLYPECTOMY;  Surgeon: Dorothyann Peng, MD;  Location: [facility];  Service: Endoscopy;  Laterality: N/A;  right colon, cecal, transverse, and sigmoid polypectomy  . Cardiac catheterization  10/04/11    Normal left main, 95% pLAD stenosis with ulceration s/p successful BMS placement, 30% segmental plaque beyond this, dLAD after D2 with 50% plaquing, then focal 70% lesion, 80% focal short D2 stenosis, 30% pOM2 lesion, 30-40% mOM3 stenosis, Shephard's crook region of RCA with 50% lesion, mRCA 50-60% lesion and mild dRCA irregularities.   . Coronary stent  placement  10/04/11  . Abdominal aortic aneurysm repair w/ endoluminal graft      01/16/2008  . Left heart catheterization with coronary angiogram N/A 10/04/2011    Procedure: LEFT HEART CATHETERIZATION WITH CORONARY ANGIOGRAM;  Surgeon: Hillary Bow, MD;  Location: [facility] CATH LAB;  Service: Cardiovascular;  Laterality: N/A;  . Av fistula placement Left 07/07/2014    Procedure: Creation of Left Arm ARTERIOVENOUS Fistula;  Surgeon: Rosetta Posner, MD;  Location: [place];  Service: Vascular;  Laterality: Left;    There were no vitals filed for this visit.  Visit Diagnosis:  Abnormality of gait  Impaired functional mobility, balance, gait, and endurance  Decreased functional activity  tolerance  CVA (cerebral vascular accident)      Subjective Assessment - 07/17/14 1631    Subjective Patient reports that he is feeling better today, swelling in his right arm has gone down and he's not having pain or feeling as tired as last session but still feeling a little off and weak    Pertinent History Patient presents with history of strokes; no falls recently, has a history of strokes in the past.    Currently in Pain? No/denies                         [facility] Adult PT Treatment/Exercise - 07/17/14 0001    Knee/Hip Exercises: Stretches   Active Hamstring Stretch 3 reps;30 seconds   Active Hamstring Stretch Limitations 14 inch box    Gastroc Stretch 3 reps;30 seconds   Gastroc Stretch Limitations slant board   Knee/Hip Exercises: Standing   Heel Raises 1 set;20 reps   Heel Raises Limitations from floor    Forward Lunges Both;1 set;10 reps   Forward Lunges Limitations 4 inch box    Side Lunges Both;1 set;10 reps   Side Lunges Limitations 4 inch box    Gait Training 6 minute walk approx 681ft today, fatigued    Knee/Hip Exercises: Seated   Other Seated Knee Exercises Cannalith repositioning with cervical L rotation/R sidelying 1x6 with some improvement in dizziness              Balance Exercises - 07/17/14 1404    Balance Exercises: Standing   Standing Eyes Closed Narrow base of support (BOS);Foam/compliant surface;3 reps;20 secs           PT Education - 07/17/14 1632    Education provided Yes   Education Details education regarding possible effect of kidney surgery on fatigue and overall activity tolerance    Person(s) Educated Patient   Methods Explanation   Comprehension Verbalized understanding          PT Short Term Goals - 07/15/14 1407    PT SHORT TERM GOAL #1   Title Patient will demonstrate the ability to ambulate at least 746ft with walker in 6 minutes, improved step lengths and upright posture during gait   Status Achieved    PT SHORT TERM GOAL #2   Title Patient will improve Berg balance score to at least 45 in order to reduce overall fall risk   Status Achieved   PT SHORT TERM GOAL #3   Title Patient will demonstrate improved bilateral ankle dorsiflexion to 12 degrees and bilateral hip IR  to at least 35 degrees in order to enhance  balance reaction skills   Status On-going   PT SHORT TERM GOAL #4   Title Patient will demonstrate the ability to maintain single leg stance on  each leg for at least 20 seconds   Status On-going           PT Long Term Goals - 07/15/14 1408    PT LONG TERM GOAL #1   Title Patient will be independent in correctly and consistently performing advanced HEP   Status On-going   PT LONG TERM GOAL #2   Title Patient will demonstrate the ability to ambulate at least 1212ft within 6 minutes with walker, improved gait mechanics, upright posture   Status On-going   PT LONG TERM GOAL #3   Title Patient will demonstrate the ability to ambulate over uneven surfaces with LRAD, improved gait mechanics, upright posture, and minimal loss of balance    PT LONG TERM GOAL #4   Title Patient will demonstrate the ability to maintain SLS on each leg for at least 30 seconds   PT LONG TERM GOAL #5   Title Patient will demonstrate the ability to perform sit to stand without use of hands from surface as low as 14 inches    Status On-going               Plan - 07/17/14 1633    Clinical Impression Statement Patient states he is feeling better but remains visibly fatigued today, possibly due to after effects of his recent kidney surgery. Continued cannalith repositioning and functional stretching/functional exercises/balance activities. Patient continues to demonstrate  significant fatigue today as he required multiple extended rest breaks and continued to demonstrate reduced distance on 6 minute walk at approximately 676ft today.    Pt will benefit from skilled therapeutic intervention in order to  improve on the following deficits Abnormal gait;Decreased endurance;Impaired perceived functional ability;Improper body mechanics;Decreased activity tolerance;Impaired flexibility;Postural dysfunction;Decreased balance;Decreased mobility;Decreased range of motion;Decreased coordination;Decreased safety awareness   Rehab Potential Good   PT Frequency 2x / week   PT Duration 8 weeks   PT Treatment/Interventions ADLs/Self Care Home Management;Gait training;Stair training;Functional mobility training;Patient/family education;Therapeutic activities;Therapeutic exercise;Manual techniques;Energy conservation;DME Instruction;Balance training   PT Next Visit Plan Cannalith repositioning techniques. Continue walking/Nustep program, functional exercises, and functional stretches. Trial activities with quad cane next session if fatigue allows. G-code next session.    PT Home Exercise Plan given   Consulted and Agree with Plan of Care Patient        Problem List Patient Active Problem List   Diagnosis Date Noted  . Dizziness   . Malnutrition of moderate degree 05/24/2014  . ICH (intracerebral hemorrhage) 05/23/2014  . HTN (hypertension) 05/19/2014  . Diarrhea 11/25/2013  . Fecal impaction with overflow incontinence of loose stool unlikely infectious 11/25/2013  . Acute kidney injury 11/25/2013  . Aftercare following surgery of the circulatory system, [place] 04/05/2013  . Anemia 12/08/2012  . Protein-calorie malnutrition, severe 12/06/2012  . CKD (chronic kidney disease), stage III 12/06/2012  . Thrombocytopenia, unspecified 12/06/2012  . Fall at home 12/06/2012  . Acute bronchitis 12/06/2012  . UTI (urinary tract infection) 12/06/2012  . Effusion of right knee joint 12/06/2012  . Altered mental status 12/05/2012  . Vascular dementia 12/05/2012  . CAD (coronary artery disease) 10/07/2011  . Chronic systolic CHF (congestive heart failure) 10/07/2011  . Acute respiratory disease 10/03/2011  .  Non-STEMI (non-ST elevated myocardial infarction) 10/02/2011  . Hypokalemia 10/02/2011  . Abdominal aneurysm without mention of rupture 04/04/2011  . History of noncompliance with medical treatment   . Cerebrovascular disease   . Adenomatous polyp 10/19/2010  . Hypertensive heart disease without CHF   . Hyperlipidemia   .  Chronic kidney disease   . Alcohol abuse   . Tobacco abuse   . Chronic obstructive pulmonary disease 02/09/2010  . History of  abdominal aortic aneurysm (stent graft)     Deniece Ree PT, DPT [place], [place], [address] Phone: [phone]   Fax:  [phone]

## 2014-07-21 ENCOUNTER — Encounter: Payer: Self-pay | Admitting: Neurology

## 2014-07-21 ENCOUNTER — Ambulatory Visit (INDEPENDENT_AMBULATORY_CARE_PROVIDER_SITE_OTHER): Payer: Medicare Other | Admitting: Neurology

## 2014-07-21 VITALS — BP 174/64 | HR 83 | Ht 73.0 in | Wt 172.8 lb

## 2014-07-21 DIAGNOSIS — I10 Essential (primary) hypertension: Secondary | ICD-10-CM | POA: Diagnosis not present

## 2014-07-21 DIAGNOSIS — N189 Chronic kidney disease, unspecified: Secondary | ICD-10-CM | POA: Diagnosis not present

## 2014-07-21 DIAGNOSIS — I480 Paroxysmal atrial fibrillation: Secondary | ICD-10-CM

## 2014-07-21 DIAGNOSIS — E785 Hyperlipidemia, unspecified: Secondary | ICD-10-CM

## 2014-07-21 DIAGNOSIS — I639 Cerebral infarction, unspecified: Secondary | ICD-10-CM | POA: Diagnosis not present

## 2014-07-21 MED ORDER — HYDRALAZINE HCL 50 MG PO TABS: 50.0000 mg | ORAL_TABLET | Freq: Three times a day (TID) | ORAL | Status: DC

## 2014-07-21 NOTE — Progress Notes (Signed)
STROKE NEUROLOGY FOLLOW UP NOTE  NAME: [name] DOB: 23-Sep-1945  REASON FOR VISIT: stroke follow up HISTORY FROM: chart and wife  Today we had the pleasure of seeing [name] in follow-up at our Neurology Clinic. Pt was accompanied by wife.   History Summary [name] is a 69 y.o. male hx of prior CVA, multiple TIAs, A. Fib on ASA 81, HTN, HLD, cardiomyopathy, CKD, CAD, AAA s/p repair was admitted to [facility] on 05/23/14 for acute onset of dizziness and generalized weakness that subsequently resolved. Head CT and MRI completed shows questionable small acute right IC ICH vs. Hemorrhagic infarct.His ASA 81mg  at home was discontinued due to bleeding. His blood pressure was tightly controlled. He was in the intensive care for 2 days and remained neurologically stable. He had no focal deficits but had mild gait imbalance. He was seen by PT/OT and considered stable for discharge home. He was advised to be compliant with his BP meds and keep follow-up with primary care physician.   Interval History During the interval time, the patient has been doing well. He has PT/OT twice a week. He has been back on ASA 81mg . However, his BP still not in good control today 174/64. Wife said his Bp at home also 154-176/70-80. He has been on amlodipine, coreg, clonidine and hydralazine but still difficult in control. His CKD gradually getting worse and recently got fistular done by [name] for preparation of HD in the future. Wife said he lack of motivation, he not moving much at home and not doing exercise. He went to rehab but came back directly go to bed.   As per wife, he has been followed up with [name] in 2007 or 2008 for his previous stroke. Since then he had numerous TIAs but did not keep following up with [name]. He was seen by [name] again this March in [place] and was asked to follow up with [name] in clinic 2 months after discharge.     REVIEW OF SYSTEMS: Full 14 system  review of systems performed and notable only for those listed below and in HPI above, all others are negative:  Constitutional:  Activity change, appetite change, fatigue Cardiovascular:  Ear/Nose/Throat:   Skin:  Eyes:  Blurry vision Respiratory:  cough Gastroitestinal:   Genitourinary: urine decrease Hematology/Lymphatic:  anemia Endocrine:  Musculoskeletal:   Allergy/Immunology:   Neurological:  Memory loss, dizziness, weakness Psychiatric: confusion, depression Sleep:   The following represents the patient's updated allergies and side effects list: No Known Allergies  The neurologically relevant items on the patient's problem list were reviewed on today's visit.  Neurologic Examination  A problem focused neurological exam (12 or more points of the single system neurologic examination, vital signs counts as 1 point, cranial nerves count for 8 points) was performed.  Blood pressure 174/64, pulse 83, height 6\' 1"  (1.854 m), weight 172 lb 12.8 oz (78.382 kg).  General - Well nourished, well developed, in no apparent distress.  Ophthalmologic - fundi not visualized due to incorporation.  Cardiovascular - Regular rate and rhythm, no irregular heart beat.  Mental Status -  Level of arousal and orientation to time, place, and person were intact. Language including expression, naming, repetition, comprehension was assessed and found intact. Fund of Knowledge was assessed and was impaired without knowing previous president.  Cranial Nerves II - XII - II - Visual field intact OU. III, IV, VI - Extraocular movements intact. V - Facial sensation  intact bilaterally. VII - Facial movement intact bilaterally. VIII - Hearing & vestibular intact bilaterally. X - Palate elevates symmetrically. XI - Chin turning & shoulder shrug intact bilaterally. XII - Tongue protrusion intact.  Motor Strength - The patient's strength was normal in all extremities and pronator drift was absent.   Bulk was normal and fasciculations were absent.   Motor Tone - Muscle tone was assessed at the neck and appendages and was normal.  Reflexes - The patient's reflexes were 1+ in all extremities and he had no pathological reflexes.  Sensory - Light touch, temperature/pinprick were assessed and were normal.    Coordination - The patient had normal movements in the hands with no ataxia or dysmetria.  Tremor was absent.  Gait and Station - walk with walker, slow and small stride, slow turns, but steady.  Data reviewed: I personally reviewed the images and agree with the radiology interpretations.  Dg Chest 2 View 05/23/2014  Severe emphysema without acute cardiopulmonary disease.   Ct Head Wo Contrast 05/23/2014  4 mm focus of acute hemorrhage posterior limb internal capsule on the right as suggested by MRI performed earlier today.   05/25/14 1. Slight interval decrease in size of subcentimeter focal hemorrhage involving the posterior limb of the right internal capsule, now 3 mm. 2. No new intracranial abnormality.  Mr Brain Wo Contrast  05/23/2014  Cannot exclude acute RIGHT posterior limb internal capsule/anterior thalamic hemorrhage, with or without associated acute ischemia. CT scan without contrast recommended. See discussion above. Possible acute RIGHT internal capsule infarct more inferiorly as seen best on coronal diffusion sequence. Correlate clinically for LEFT-sided weakness. Advanced atrophy and chronic microvascular ischemic change, with numerous microbleeds which have developed since previous brain MR in 2014, most of which however are likely chronic. Widespread areas of chronic lacunar infarction and BILATERAL cerebellar hemispheric infarction.   US Abdomen Complete 05/23/2014  1. Cholelithiasis without evidence of cholecystitis.  2. Chronic kidney disease with asymmetric atrophy of the right kidney.  3. No acute findings identified.   CUS - Bilateral:  1-39% ICA stenosis. Vertebral artery flow is antegrade.  2D echo - - Left ventricle: The cavity size was normal. There was moderate concentric hypertrophy. Systolic function was normal. The estimated ejection fraction was in the range of 50% to 55%. Wall motion was normal; there were no regional wall motion abnormalities. Doppler parameters are consistent with abnormal left ventricular relaxation (grade 1 diastolic dysfunction). Doppler parameters are consistent with high ventricular filling pressure. - Ventricular septum: Septal motion showed abnormal function and dyssynergy. These changes are consistent with intraventricular conduction delay. - Aortic valve: Mildly thickened, mildly calcified leaflets. There was moderate regurgitation. - Mitral valve: There was trivial regurgitation. - Left atrium: The atrium was moderately dilated. Volume/bsa, ES (1-plane Simpson&'s, A4C): 37.1 ml/m^2. - Right atrium: The atrium was mildly dilated. - Tricuspid valve: There was trivial regurgitation.  Assessment: As you may recall, he is a 69 y.o. African American male with PMH of prior CVA, multiple TIAs, A. Fib on ASA 81, HTN, HLD, cardiomyopathy, CKD, CAD, AAA s/p repair was admitted to [facility] on 05/23/14 for questionable small acute right IC ICH vs. Hemorrhagic infarct.His ASA 81mg  at home was discontinued due to bleeding. Symptoms resolved and BP in control. He was d/c to home. He continued to have PT/OT as outpt. His BP still not in good control. Will increase hydralazine dose. He is back on ASA 81mg  now. He has hx of afib and during last admission, Dr.  Leonie Man thinks "he has had a small hemorrhagic right basal ganglia infarct from atrial fibrillation." therefore, ideally he should be on anticoagulation with coumadin due to CKD. However, he has multiple microbleeds in the brain with BP not in control, will not consider coumadin this time. In the future follow up with [name], this can  be considered for stroke prevention if his BP in good control.   Plan:  - continue ASA 81 mg and lipitor for stroke prevention for now. - BP still not in control and will increase hydralazine to 50mg  tid. Continue other BP meds. - compliance with home meds - continue to check BP at home and record and bring over to PCP for medication adjustment - due to hx of afib and possible infarct due to afib last admission, for his stroke prevention, ideally he should be on coumadin (pt has CKD), however, due to multiple microbleds on MRI and high BP, he is not candidate for coumadin this time. Will revisit this issue next time he sees [name]. - Follow up with your primary care physician for stroke risk factor modification. Recommend maintain blood pressure goal <130/80, diabetes with hemoglobin A1c goal below 6.5% and lipids with LDL cholesterol goal below 70 mg/dL.  - He is former pt with [name] and he will follow up with [name] in 2 months.   No orders of the defined types were placed in this encounter.    Meds ordered this encounter  Medications  . hydrALAZINE (APRESOLINE) 50 MG tablet    Sig: Take 1 tablet (50 mg total) by mouth 3 (three) times daily.    Dispense:  90 tablet    Refill:  3    Patient Instructions  - continue ASA 81 mg and lipitor for stroke prevention - your BP still high and we are going to increase hydralazine to 50mg  three times a day. - compliance with medication - continue to check BP at home and record and bring over to PCP for medication adjustment - you have hx of afib. For stroke prevention, ideally you should be on coumadin, but due to your bleeding history and high BP, we will not put you on coumadin this time. You can follow up with [name] next time and discuss about that. - Follow up with your primary care physician for stroke risk factor modification. Recommend maintain blood pressure goal <130/80, diabetes with hemoglobin A1c goal below 6.5% and lipids  with LDL cholesterol goal below 70 mg/dL.  - He is former pt with [name] and he will follow up with [name] in 2 months.    Rosalin Hawking, MD PhD [facility] [address] [place], [place] [address] [phone]

## 2014-07-21 NOTE — Patient Instructions (Signed)
-   continue ASA 81 mg and lipitor for stroke prevention - your BP still high and we are going to increase hydralazine to 50mg  three times a day. - compliance with medication - continue to check BP at home and record and bring over to PCP for medication adjustment - you have hx of afib. For stroke prevention, ideally you should be on coumadin, but due to your bleeding history and high BP, we will not put you on coumadin this time. You can follow up with [name] next time and discuss about that. - Follow up with your primary care physician for stroke risk factor modification. Recommend maintain blood pressure goal <130/80, diabetes with hemoglobin A1c goal below 6.5% and lipids with LDL cholesterol goal below 70 mg/dL.  - He is former pt with [name] and he will follow up with [name] in 2 months.

## 2014-07-22 ENCOUNTER — Ambulatory Visit (HOSPITAL_COMMUNITY): Payer: Medicare Other | Attending: Neurology | Admitting: Physical Therapy

## 2014-07-22 DIAGNOSIS — R6889 Other general symptoms and signs: Secondary | ICD-10-CM | POA: Insufficient documentation

## 2014-07-22 DIAGNOSIS — I69398 Other sequelae of cerebral infarction: Secondary | ICD-10-CM | POA: Insufficient documentation

## 2014-07-22 DIAGNOSIS — R269 Unspecified abnormalities of gait and mobility: Secondary | ICD-10-CM | POA: Insufficient documentation

## 2014-07-22 DIAGNOSIS — Z7409 Other reduced mobility: Secondary | ICD-10-CM | POA: Diagnosis not present

## 2014-07-22 DIAGNOSIS — I639 Cerebral infarction, unspecified: Secondary | ICD-10-CM

## 2014-07-22 NOTE — Therapy (Signed)
[place], [place], [address] Phone: [phone]   Fax:  [phone]  Physical Therapy Treatment  Patient Details  Name: CLESTON LAUTNER MRN: [id number] Date of Birth: 11-19-1945 Referring Provider:  Kathyrn Drown, MD  Encounter Date: 07/22/2014      PT End of Session - 07/22/14 1613    Visit Number 10   Number of Visits 16   Date for PT Re-Evaluation 07/24/14   Authorization Type BCBS Medicare   Authorization Time Period 05/28/14 to 07/28/14   Authorization - Visit Number 10   Authorization - Number of Visits 20   PT Start Time 9150   PT Stop Time 1348   PT Time Calculation (min) 44 min   Activity Tolerance Patient tolerated treatment well   Behavior During Therapy [facility] for tasks assessed/performed      Past Medical History  Diagnosis Date  . Hypertension   . COPD (chronic obstructive pulmonary disease)   . Hyperlipidemia   . Cardiomyopathy     EF of 30% per echo in June of 2012; admitted with congestive heart failure; nonobstructive CAD on cath in 08/2010  . History of noncompliance with medical treatment   . Cerebrovascular disease 2011    CVA  in 02/2010-only deficit is decreased vision in  right eye  . Alcohol abuse   . Tobacco abuse     40 pack years  . Abdominal aortic aneurysm     Stent graft repair  . Chronic systolic CHF (congestive heart failure)   . Leg pain   . CKD (chronic kidney disease) stage 3, GFR 30-59 ml/min     creatinine-1.7 in 08/2010  . CAD (coronary artery disease)   . Myocardial infarction acute 10/01/11  . CKD (chronic kidney disease), stage III 12/06/2012  . Effusion of right knee joint 12/06/2012  . Anemia   . Atrial fibrillation   . Ataxic gait   . Weakness generalized   . Frequent falls   . Forgetfulness   . Stroke 2011    decreased vision of right eye    Most recent 05/23/14 - bleed - has a drag to his left leg  . Depression   . Anxiety     Takes Citalopram  .  Arthritis     knees    Past Surgical History  Procedure Laterality Date  . Colonoscopy w/ polypectomy  2006    Dr. Tamala Julian: anal fissure, sessile adenomatous polyp, diverticulosis  . Esophagogastroduodenoscopy  11/15/2010    Procedure: ESOPHAGOGASTRODUODENOSCOPY (EGD);  Surgeon: Dorothyann Peng, MD;  [facility];  Service: Endoscopy;  Laterality: N/A;  with propofol sedation; procedure start @ 0813  . Flexible sigmoidoscopy  11/15/2010    Procedure: FLEXIBLE SIGMOIDOSCOPY;  Surgeon: Dorothyann Peng, MD;  Location: [facility];  Service: Endoscopy;  Laterality: N/A;  with propofol sedation; ended @ 0808  . Polypectomy  12/13/2010    Procedure: POLYPECTOMY;  Surgeon: Dorothyann Peng, MD;  Location: [facility];  Service: Endoscopy;  Laterality: N/A;  right colon, cecal, transverse, and sigmoid polypectomy  . Cardiac catheterization  10/04/11    Normal left main, 95% pLAD stenosis with ulceration s/p successful BMS placement, 30% segmental plaque beyond this, dLAD after D2 with 50% plaquing, then focal 70% lesion, 80% focal short D2 stenosis, 30% pOM2 lesion, 30-40% mOM3 stenosis, Shephard's crook region of RCA with 50% lesion, mRCA 50-60% lesion and mild dRCA irregularities.   . Coronary stent placement  10/04/11  .  Abdominal aortic aneurysm repair w/ endoluminal graft      01/16/2008  . Left heart catheterization with coronary angiogram N/A 10/04/2011    Procedure: LEFT HEART CATHETERIZATION WITH CORONARY ANGIOGRAM;  Surgeon: Hillary Bow, MD;  Location: [facility] CATH LAB;  Service: Cardiovascular;  Laterality: N/A;  . Av fistula placement Left 07/07/2014    Procedure: Creation of Left Arm ARTERIOVENOUS Fistula;  Surgeon: Rosetta Posner, MD;  Location: [place];  Service: Vascular;  Laterality: Left;    There were no vitals filed for this visit.  Visit Diagnosis:  Abnormality of gait  Impaired functional mobility, balance, gait, and endurance  Decreased functional activity tolerance  CVA (cerebral vascular  accident)      Subjective Assessment - 07/22/14 1611    Subjective Pt states he is not feeling as tired today  STates he is ready to try the QC.  States he has one at home.  Currently without pain.   Currently in Pain? No/denies            [facility] PT Assessment - 07/22/14 1621    Assessment   Medical Diagnosis CVA   Onset Date 05/23/14   Next MD Visit Leonie Man 2 months from now   Observation/Other Assessments   Focus on Therapeutic Outcomes (FOTO)  46% limited                     [facility] Adult PT Treatment/Exercise - 07/22/14 1341    Knee/Hip Exercises: Standing   Forward Lunges Both;1 set;10 reps   Forward Lunges Limitations 4 inch box    Side Lunges Both;1 set;10 reps   Side Lunges Limitations 4 inch box    Functional Squat 15 reps   Gait Training 6 minute walk approx 323ft with NBQC (was 650 with RW) fatigued                   PT Short Term Goals - 07/22/14 1617    PT SHORT TERM GOAL #1   Title Patient will demonstrate the ability to ambulate at least 711ft with walker in 6 minutes, improved step lengths and upright posture during gait   Time 4   Period Weeks   Status Achieved   PT SHORT TERM GOAL #2   Title Patient will improve Berg balance score to at least 45 in order to reduce overall fall risk   Baseline 4/5- Berg 47   Time 4   Period Weeks   Status Achieved   PT SHORT TERM GOAL #3   Title Patient will demonstrate improved bilateral ankle dorsiflexion to 12 degrees and bilateral hip IR  to at least 35 degrees in order to enhance  balance reaction skills   Time 4   Period Weeks   Status On-going   PT SHORT TERM GOAL #4   Title Patient will demonstrate the ability to maintain single leg stance on each leg for at least 20 seconds   Time 4   Period Weeks   Status On-going           PT Long Term Goals - 07/22/14 1618    PT LONG TERM GOAL #1   Title Patient will be independent in correctly and consistently performing advanced HEP    Time 8   Period Weeks   Status On-going   PT LONG TERM GOAL #2   Title Patient will demonstrate the ability to ambulate at least 1211ft within 6 minutes with walker, improved gait mechanics, upright posture  Time 8   Period Weeks   Status On-going   PT LONG TERM GOAL #3   Title Patient will demonstrate the ability to ambulate over uneven surfaces with LRAD, improved gait mechanics, upright posture, and minimal loss of balance    Time 8   Period Weeks   Status On-going   PT LONG TERM GOAL #4   Title Patient will demonstrate the ability to maintain SLS on each leg for at least 30 seconds   Time 8   Period Weeks   Status On-going   PT LONG TERM GOAL #5   Title Patient will demonstrate the ability to perform sit to stand without use of hands from surface as low as 14 inches    Time 8   Period Weeks   Status On-going               Plan - 08-01-14 1613    Clinical Impression Statement Cannalith manuever not completed today as patient without vestibular symtoms.  Began gait with QC with patient able to complete 398 feet in 6 minutes, approx half of completed using RW (650 ft). Pt required less rest breaks and with overall good stability completing exericses.  FOTO completed today and Gcode recommended.  Pt continues to progress towards goals.     PT Next Visit Plan Cannalith repositioning techniques as needed.   Continue walking/Nustep program.  Progress balance and functional actvities.           Problem List Patient Active Problem List   Diagnosis Date Noted  . Stroke 07/21/2014  . CKD (chronic kidney disease) 07/21/2014  . HLD (hyperlipidemia) 07/21/2014  . Paroxysmal atrial fibrillation 07/21/2014  . Dizziness   . Malnutrition of moderate degree 05/24/2014  . ICH (intracerebral hemorrhage) 05/23/2014  . HTN (hypertension) 05/19/2014  . Diarrhea 11/25/2013  . Fecal impaction with overflow incontinence of loose stool unlikely infectious 11/25/2013  . Acute kidney  injury 11/25/2013  . Aftercare following surgery of the circulatory system, [place] 04/05/2013  . Anemia 12/08/2012  . Protein-calorie malnutrition, severe 12/06/2012  . CKD (chronic kidney disease), stage III 12/06/2012  . Thrombocytopenia, unspecified 12/06/2012  . Fall at home 12/06/2012  . Acute bronchitis 12/06/2012  . UTI (urinary tract infection) 12/06/2012  . Effusion of right knee joint 12/06/2012  . Altered mental status 12/05/2012  . Vascular dementia 12/05/2012  . CAD (coronary artery disease) 10/07/2011  . Chronic systolic CHF (congestive heart failure) 10/07/2011  . Acute respiratory disease 10/03/2011  . Non-STEMI (non-ST elevated myocardial infarction) 10/02/2011  . Hypokalemia 10/02/2011  . Abdominal aneurysm without mention of rupture 04/04/2011  . History of noncompliance with medical treatment   . Cerebrovascular disease   . Adenomatous polyp 10/19/2010  . Hypertensive heart disease without CHF   . Hyperlipidemia   . Chronic kidney disease   . Alcohol abuse   . Tobacco abuse   . Chronic obstructive pulmonary disease 02/09/2010  . History of  abdominal aortic aneurysm (stent graft)     Teena Irani, PTA/CLT [phone] 08-01-14, 4:52 PM  [place], [place], [address] Phone: [phone]   Fax:  [phone]       G-Codes - Aug 01, 2014 1651    Functional Assessment Tool Used Skilled clinical assessment and observation; primarily based on reduced functional activity tolerance and gait impairment   Functional Limitation Mobility: Walking and moving around   Mobility: Walking and Moving Around Current Status [phone]) At least 40  percent but less than 60 percent impaired, limited or restricted   Mobility: Walking and Moving Around Goal Status [phone]) At least 20 percent but less than 40 percent impaired, limited or restricted      Deniece Ree PT, DPT [phone]

## 2014-07-24 ENCOUNTER — Telehealth (HOSPITAL_COMMUNITY): Payer: Self-pay | Admitting: Physical Therapy

## 2014-07-24 ENCOUNTER — Ambulatory Visit (HOSPITAL_COMMUNITY): Payer: Medicare Other | Admitting: Physical Therapy

## 2014-07-24 NOTE — Telephone Encounter (Signed)
He is sick today °

## 2014-07-28 ENCOUNTER — Other Ambulatory Visit: Payer: Self-pay | Admitting: *Deleted

## 2014-07-28 MED ORDER — AMLODIPINE BESYLATE 10 MG PO TABS: 10.0000 mg | ORAL_TABLET | Freq: Every day | ORAL | Status: DC

## 2014-07-29 ENCOUNTER — Ambulatory Visit (HOSPITAL_COMMUNITY): Payer: Medicare Other | Admitting: Physical Therapy

## 2014-07-29 DIAGNOSIS — I639 Cerebral infarction, unspecified: Secondary | ICD-10-CM

## 2014-07-29 DIAGNOSIS — R269 Unspecified abnormalities of gait and mobility: Secondary | ICD-10-CM

## 2014-07-29 DIAGNOSIS — R6889 Other general symptoms and signs: Secondary | ICD-10-CM

## 2014-07-29 DIAGNOSIS — Z7409 Other reduced mobility: Secondary | ICD-10-CM

## 2014-07-29 DIAGNOSIS — I69398 Other sequelae of cerebral infarction: Secondary | ICD-10-CM | POA: Diagnosis not present

## 2014-07-29 NOTE — Therapy (Signed)
[place], [place], [address] Phone: [phone]   Fax:  [phone]  Physical Therapy Treatment (Re-Assessment)   Patient Details  Name: [name] MRN: [id number] Date of Birth: 17-Dec-1945 Referring Provider:  Kathyrn Drown, MD  Encounter Date: 07/29/2014      PT End of Session - 07/29/14 1444    Visit Number 11   Number of Visits 16   Date for PT Re-Evaluation 08/26/14   Authorization Type BCBS Medicare   Authorization Time Period 05/28/14 to 88/89/16; recert done for 9/45 to 7/10   Authorization - Visit Number 11   Authorization - Number of Visits 20   PT Start Time 1345   PT Stop Time 1432   PT Time Calculation (min) 47 min   Activity Tolerance Patient tolerated treatment well   Behavior During Therapy [facility] for tasks assessed/performed      Past Medical History  Diagnosis Date  . Hypertension   . COPD (chronic obstructive pulmonary disease)   . Hyperlipidemia   . Cardiomyopathy     EF of 30% per echo in June of 2012; admitted with congestive heart failure; nonobstructive CAD on cath in 08/2010  . History of noncompliance with medical treatment   . Cerebrovascular disease 2011    CVA  in 02/2010-only deficit is decreased vision in  right eye  . Alcohol abuse   . Tobacco abuse     40 pack years  . Abdominal aortic aneurysm     Stent graft repair  . Chronic systolic CHF (congestive heart failure)   . Leg pain   . CKD (chronic kidney disease) stage 3, GFR 30-59 ml/min     creatinine-1.7 in 08/2010  . CAD (coronary artery disease)   . Myocardial infarction acute 10/01/11  . CKD (chronic kidney disease), stage III 12/06/2012  . Effusion of right knee joint 12/06/2012  . Anemia   . Atrial fibrillation   . Ataxic gait   . Weakness generalized   . Frequent falls   . Forgetfulness   . Stroke 2011    decreased vision of right eye    Most recent 05/23/14 - bleed - has a drag to his left leg  .  Depression   . Anxiety     Takes Citalopram  . Arthritis     knees    Past Surgical History  Procedure Laterality Date  . Colonoscopy w/ polypectomy  2006    Dr. Tamala Julian: anal fissure, sessile adenomatous polyp, diverticulosis  . Esophagogastroduodenoscopy  11/15/2010    Procedure: ESOPHAGOGASTRODUODENOSCOPY (EGD);  Surgeon: Dorothyann Peng, MD;  [facility];  Service: Endoscopy;  Laterality: N/A;  with propofol sedation; procedure start @ 0813  . Flexible sigmoidoscopy  11/15/2010    Procedure: FLEXIBLE SIGMOIDOSCOPY;  Surgeon: Dorothyann Peng, MD;  [facility];  Service: Endoscopy;  Laterality: N/A;  with propofol sedation; ended @ 0808  . Polypectomy  12/13/2010    Procedure: POLYPECTOMY;  Surgeon: Dorothyann Peng, MD;  Location: [facility];  Service: Endoscopy;  Laterality: N/A;  right colon, cecal, transverse, and sigmoid polypectomy  . Cardiac catheterization  10/04/11    Normal left main, 95% pLAD stenosis with ulceration s/p successful BMS placement, 30% segmental plaque beyond this, dLAD after D2 with 50% plaquing, then focal 70% lesion, 80% focal short D2 stenosis, 30% pOM2 lesion, 30-40% mOM3 stenosis, Shephard's crook region of RCA with 50% lesion, mRCA 50-60% lesion and mild dRCA irregularities.   Marland Kitchen  Coronary stent placement  10/04/11  . Abdominal aortic aneurysm repair w/ endoluminal graft      01/16/2008  . Left heart catheterization with coronary angiogram N/A 10/04/2011    Procedure: LEFT HEART CATHETERIZATION WITH CORONARY ANGIOGRAM;  Surgeon: Hillary Bow, MD;  Location: [facility] CATH LAB;  Service: Cardiovascular;  Laterality: N/A;  . Av fistula placement Left 07/07/2014    Procedure: Creation of Left Arm ARTERIOVENOUS Fistula;  Surgeon: Rosetta Posner, MD;  Location: [place];  Service: Vascular;  Laterality: Left;    There were no vitals filed for this visit.  Visit Diagnosis:  Abnormality of gait - Plan: PT plan of care cert/re-cert  Impaired functional mobility, balance,  gait, and endurance - Plan: PT plan of care cert/re-cert  Decreased functional activity tolerance - Plan: PT plan of care cert/re-cert  CVA (cerebral vascular accident) - Plan: PT plan of care cert/re-cert          [facility] PT Assessment - 07/29/14 0001    Assessment   Medical Diagnosis CVA   Onset Date 05/23/14   Next MD Visit Leonie Man 2 months from now   Observation/Other Assessments   Observations Patient remains dizzy, around 4/10 dizziness    Focus on Therapeutic Outcomes (FOTO)  40% limited    AROM   Right Hip External Rotation  43   Right Hip Internal Rotation  33   Left Hip External Rotation  44   Left Hip Internal Rotation  33   Right Ankle Dorsiflexion 7   Left Ankle Dorsiflexion 8   Strength   Right Hip Flexion 5/5   Right Hip Extension 4-/5   Right Hip ABduction 4/5   Left Hip Flexion 5/5   Left Hip Extension 4-/5   Left Hip ABduction 4+/5   Right Knee Flexion 5/5   Right Knee Extension 5/5   Left Knee Flexion 5/5   Left Knee Extension 5/5   Right Ankle Dorsiflexion 5/5   Left Ankle Dorsiflexion 5/5   Flexibility   Quadriceps Continues to be very tight bilaterally    Oceanographer Test   Sit to Stand Able to stand  independently using hands   Standing Unsupported Able to stand safely 2 minutes   Sitting with Back Unsupported but Feet Supported on Floor or Stool Able to sit safely and securely 2 minutes   Stand to Sit Controls descent by using hands   Transfers Able to transfer safely, minor use of hands   Standing Unsupported with Eyes Closed Able to stand 10 seconds safely   Standing Ubsupported with Feet Together Able to place feet together independently and stand 1 minute safely   From Standing, Reach Forward with Outstretched Arm Can reach confidently >25 cm (10")   From Standing Position, Pick up Object from Floor Able to pick up shoe safely and easily   From Standing Position, Turn to Look Behind Over each Shoulder Looks behind from both sides and weight  shifts well   Turn 360 Degrees Able to turn 360 degrees safely one side only in 4 seconds or less   Standing Unsupported, Alternately Place Feet on Step/Stool Able to stand independently and complete 8 steps >20 seconds   Standing Unsupported, One Foot in Front Able to plae foot ahead of the other independently and hold 30 seconds   Standing on One Leg Able to lift leg independently and hold equal to or more than 3 seconds   Total Score 49   High Level Balance  High Level Balance Comments 6 minute walk with walker, 781ft                              PT Education - 07/29/14 1444    Education provided Yes   Education Details education regarding progress with skilled PT services, plan of care moving forward    Person(s) Educated Patient   Methods Explanation   Comprehension Verbalized understanding          PT Short Term Goals - 07/29/14 1450    PT SHORT TERM GOAL #1   Title Patient will demonstrate the ability to ambulate at least 716ft with walker in 6 minutes, improved step lengths and upright posture during gait   Baseline 5/10- recently walking shorter distances however may be due to surgery done recently, 705 ft today in 6 minutes    Time 4   Period Weeks   Status Achieved   PT SHORT TERM GOAL #2   Title Patient will improve Berg balance score to at least 45 in order to reduce overall fall risk   Baseline 4/5- Berg 47   Time 4   Period Weeks   Status Achieved   PT SHORT TERM GOAL #3   Title Patient will demonstrate improved bilateral ankle dorsiflexion to 12 degrees and bilateral hip IR  to at least 35 degrees in order to enhance  balance reaction skills   Time 4   Period Weeks   Status On-going   PT SHORT TERM GOAL #4   Title Patient will demonstrate the ability to maintain single leg stance on each leg for at least 20 seconds   Time 4   Period Weeks   Status On-going           PT Long Term Goals - 07/29/14 1452    PT LONG TERM GOAL #1    Title Patient will be independent in correctly and consistently performing advanced HEP   Baseline 5/10- patient states he is not doing his exercises every day    Time 8   Period Weeks   Status On-going   PT LONG TERM GOAL #2   Title Patient will demonstrate the ability to ambulate at least 1219ft within 6 minutes with walker, improved gait mechanics, upright posture   Time 8   Period Weeks   Status On-going   PT LONG TERM GOAL #3   Title Patient will demonstrate the ability to ambulate over uneven surfaces with LRAD, improved gait mechanics, upright posture, and minimal loss of balance    Time 8   Period Weeks   Status On-going   PT LONG TERM GOAL #4   Title Patient will demonstrate the ability to maintain SLS on each leg for at least 30 seconds   Time 8   Period Weeks   Status On-going   PT LONG TERM GOAL #5   Title Patient will demonstrate the ability to perform sit to stand without use of hands from surface as low as 14 inches    Time 8   Period Weeks   Status On-going               Plan - 07/29/14 1445    Clinical Impression Statement Re-assessment performed today. Patient shows improvements in his strength but continues to demonstrate deficits in his balance and overall functional activity tolerance, as well as with his functional task performance. Patient reports that he feels like he is improving  but is just not quite where he'd like to be just yet. Last session therapy staff initiated gait training with the quad cane. Patient will benefit from another four weeks of skilled PT services at 2x/week in order to continue to address his deficits, to assist him in reaching an optimal level of function, and to reduce overall fall risk.    Pt will benefit from skilled therapeutic intervention in order to improve on the following deficits Abnormal gait;Decreased endurance;Impaired perceived functional ability;Improper body mechanics;Decreased activity tolerance;Impaired  flexibility;Postural dysfunction;Decreased balance;Decreased mobility;Decreased range of motion;Decreased coordination;Decreased safety awareness   Rehab Potential Good   PT Frequency 2x / week   PT Duration 8 weeks   PT Treatment/Interventions ADLs/Self Care Home Management;Gait training;Stair training;Functional mobility training;Patient/family education;Therapeutic activities;Therapeutic exercise;Manual techniques;Energy conservation;DME Instruction;Balance training   PT Next Visit Plan Cannalith repositioning techniques as needed.   Focus gait training on use of quad cane.   Progress balance and functional actvities.      PT Home Exercise Plan given   Consulted and Agree with Plan of Care Patient        Problem List Patient Active Problem List   Diagnosis Date Noted  . Stroke 07/21/2014  . CKD (chronic kidney disease) 07/21/2014  . HLD (hyperlipidemia) 07/21/2014  . Paroxysmal atrial fibrillation 07/21/2014  . Dizziness   . Malnutrition of moderate degree 05/24/2014  . ICH (intracerebral hemorrhage) 05/23/2014  . HTN (hypertension) 05/19/2014  . Diarrhea 11/25/2013  . Fecal impaction with overflow incontinence of loose stool unlikely infectious 11/25/2013  . Acute kidney injury 11/25/2013  . Aftercare following surgery of the circulatory system, [place] 04/05/2013  . Anemia 12/08/2012  . Protein-calorie malnutrition, severe 12/06/2012  . CKD (chronic kidney disease), stage III 12/06/2012  . Thrombocytopenia, unspecified 12/06/2012  . Fall at home 12/06/2012  . Acute bronchitis 12/06/2012  . UTI (urinary tract infection) 12/06/2012  . Effusion of right knee joint 12/06/2012  . Altered mental status 12/05/2012  . Vascular dementia 12/05/2012  . CAD (coronary artery disease) 10/07/2011  . Chronic systolic CHF (congestive heart failure) 10/07/2011  . Acute respiratory disease 10/03/2011  . Non-STEMI (non-ST elevated myocardial infarction) 10/02/2011  . Hypokalemia 10/02/2011  .  Abdominal aneurysm without mention of rupture 04/04/2011  . History of noncompliance with medical treatment   . Cerebrovascular disease   . Adenomatous polyp 10/19/2010  . Hypertensive heart disease without CHF   . Hyperlipidemia   . Chronic kidney disease   . Alcohol abuse   . Tobacco abuse   . Chronic obstructive pulmonary disease 02/09/2010  . History of  abdominal aortic aneurysm (stent graft)     Deniece Ree PT, DPT [place], [place], [address] Phone: [phone]   Fax:  [phone]

## 2014-07-30 ENCOUNTER — Ambulatory Visit (INDEPENDENT_AMBULATORY_CARE_PROVIDER_SITE_OTHER): Payer: Medicare Other | Admitting: *Deleted

## 2014-07-30 ENCOUNTER — Other Ambulatory Visit: Payer: Self-pay

## 2014-07-30 DIAGNOSIS — Z111 Encounter for screening for respiratory tuberculosis: Secondary | ICD-10-CM

## 2014-07-31 ENCOUNTER — Ambulatory Visit (HOSPITAL_COMMUNITY): Payer: Medicare Other | Admitting: Physical Therapy

## 2014-07-31 ENCOUNTER — Encounter: Payer: Self-pay | Admitting: Vascular Surgery

## 2014-07-31 DIAGNOSIS — Z7409 Other reduced mobility: Secondary | ICD-10-CM

## 2014-07-31 DIAGNOSIS — R6889 Other general symptoms and signs: Secondary | ICD-10-CM

## 2014-07-31 DIAGNOSIS — R269 Unspecified abnormalities of gait and mobility: Secondary | ICD-10-CM

## 2014-07-31 DIAGNOSIS — I69398 Other sequelae of cerebral infarction: Secondary | ICD-10-CM | POA: Diagnosis not present

## 2014-07-31 DIAGNOSIS — I639 Cerebral infarction, unspecified: Secondary | ICD-10-CM

## 2014-07-31 NOTE — Therapy (Signed)
[place], [place], [address] Phone: [phone]   Fax:  [phone]  Physical Therapy Treatment  Patient Details  Name: [name] MRN: [id number] Date of Birth: [birth year] Referring Provider:  Kathyrn Drown, MD  Encounter Date: 07/31/2014      PT End of Session - 07/31/14 1514    Visit Number 12   Number of Visits 16   Date for PT Re-Evaluation 08/26/14   Authorization Type BCBS Medicare   Authorization Time Period 05/28/14 to 48/54/62; recert done for 7/03 to 7/10   Authorization - Visit Number 12   Authorization - Number of Visits 20   PT Start Time 1301   PT Stop Time 1342   PT Time Calculation (min) 41 min   Equipment Utilized During Treatment Gait belt   Activity Tolerance Patient tolerated treatment well   Behavior During Therapy [facility] for tasks assessed/performed      Past Medical History  Diagnosis Date  . Hypertension   . COPD (chronic obstructive pulmonary disease)   . Hyperlipidemia   . Cardiomyopathy     EF of 30% per echo in June of 2012; admitted with congestive heart failure; nonobstructive CAD on cath in 08/2010  . History of noncompliance with medical treatment   . Cerebrovascular disease 2011    CVA  in 02/2010-only deficit is decreased vision in  right eye  . Alcohol abuse   . Tobacco abuse     40 pack years  . Abdominal aortic aneurysm     Stent graft repair  . Chronic systolic CHF (congestive heart failure)   . Leg pain   . CKD (chronic kidney disease) stage 3, GFR 30-59 ml/min     creatinine-1.7 in 08/2010  . CAD (coronary artery disease)   . Myocardial infarction acute 10/01/11  . CKD (chronic kidney disease), stage III 12/06/2012  . Effusion of right knee joint 12/06/2012  . Anemia   . Atrial fibrillation   . Ataxic gait   . Weakness generalized   . Frequent falls   . Forgetfulness   . Stroke 2011    decreased vision of right eye    Most recent 05/23/14 - bleed - has  a drag to his left leg  . Depression   . Anxiety     Takes Citalopram  . Arthritis     knees    Past Surgical History  Procedure Laterality Date  . Colonoscopy w/ polypectomy  2006    Dr. Tamala Julian: anal fissure, sessile adenomatous polyp, diverticulosis  . Esophagogastroduodenoscopy  11/15/2010    Procedure: ESOPHAGOGASTRODUODENOSCOPY (EGD);  Surgeon: Dorothyann Peng, MD;  Location: [facility];  Service: Endoscopy;  Laterality: N/A;  with propofol sedation; procedure start @ 0813  . Flexible sigmoidoscopy  11/15/2010    Procedure: FLEXIBLE SIGMOIDOSCOPY;  Surgeon: Dorothyann Peng, MD;  Location: [facility];  Service: Endoscopy;  Laterality: N/A;  with propofol sedation; ended @ 0808  . Polypectomy  12/13/2010    Procedure: POLYPECTOMY;  Surgeon: Dorothyann Peng, MD;  Location: [facility];  Service: Endoscopy;  Laterality: N/A;  right colon, cecal, transverse, and sigmoid polypectomy  . Cardiac catheterization  10/04/11    Normal left main, 95% pLAD stenosis with ulceration s/p successful BMS placement, 30% segmental plaque beyond this, dLAD after D2 with 50% plaquing, then focal 70% lesion, 80% focal short D2 stenosis, 30% pOM2 lesion, 30-40% mOM3 stenosis, Shephard's crook region of RCA with 50% lesion, mRCA  50-60% lesion and mild dRCA irregularities.   . Coronary stent placement  10/04/11  . Abdominal aortic aneurysm repair w/ endoluminal graft      01/16/2008  . Left heart catheterization with coronary angiogram N/A 10/04/2011    Procedure: LEFT HEART CATHETERIZATION WITH CORONARY ANGIOGRAM;  Surgeon: Hillary Bow, MD;  Location: [facility] CATH LAB;  Service: Cardiovascular;  Laterality: N/A;  . Av fistula placement Left 07/07/2014    Procedure: Creation of Left Arm ARTERIOVENOUS Fistula;  Surgeon: Rosetta Posner, MD;  Location: [facility];  Service: Vascular;  Laterality: Left;    There were no vitals filed for this visit.  Visit Diagnosis:  Abnormality of gait  Impaired functional mobility, balance, gait, and  endurance  Decreased functional activity tolerance  CVA (cerebral vascular accident)      Subjective Assessment - 07/31/14 1304    Subjective Patient states that his dizziness is decreased today, around 4/10 dizziness, but that he is still feeling somewhat tired overall. Requests that we not do any cannalith repositioning today since dizziness has improved.    Pertinent History Patient presents with history of strokes; no falls recently, has a history of strokes in the past.    Currently in Pain? No/denies                         [facility] Adult PT Treatment/Exercise - 07/31/14 0001    Ambulation/Gait   Ambulation/Gait Yes   Ambulation/Gait Assistance 4: Min guard   Ambulation Distance (Feet) --  225 with quad cane; 112, 112 with single point cane    Gait Comments Cues for sequencing and safety with each respective device    Knee/Hip Exercises: Stretches   Active Hamstring Stretch 2 reps;30 seconds   Active Hamstring Stretch Limitations 14 inch box    Gastroc Stretch 3 reps;30 seconds   Gastroc Stretch Limitations slant board    Knee/Hip Exercises: Standing   Heel Raises 1 set;10 reps   Heel Raises Limitations with cane   Forward Lunges Both;1 set   Forward Lunges Limitations 4 inch box, single point cane   Functional Squat 5 reps;Other (comment)  poor form despite cues from therapist    Other Standing Knee Exercises Standing marches 1x10 with cane    Other Standing Knee Exercises Standing hip ABD with cane 1x10                PT Education - 07/31/14 1514    Education provided Yes   Education Details education that we may need to reschedule appointments depending on when dialysis starts/dialysis schedule    Person(s) Educated Patient   Methods Explanation   Comprehension Verbalized understanding          PT Short Term Goals - 07/29/14 1450    PT SHORT TERM GOAL #1   Title Patient will demonstrate the ability to ambulate at least 731ft with  walker in 6 minutes, improved step lengths and upright posture during gait   Baseline 5/10- recently walking shorter distances however may be due to surgery done recently, 705 ft today in 6 minutes    Time 4   Period Weeks   Status Achieved   PT SHORT TERM GOAL #2   Title Patient will improve Berg balance score to at least 45 in order to reduce overall fall risk   Baseline 4/5- Berg 47   Time 4   Period Weeks   Status Achieved   PT SHORT TERM GOAL #3  Title Patient will demonstrate improved bilateral ankle dorsiflexion to 12 degrees and bilateral hip IR  to at least 35 degrees in order to enhance  balance reaction skills   Time 4   Period Weeks   Status On-going   PT SHORT TERM GOAL #4   Title Patient will demonstrate the ability to maintain single leg stance on each leg for at least 20 seconds   Time 4   Period Weeks   Status On-going           PT Long Term Goals - 07/29/14 1452    PT LONG TERM GOAL #1   Title Patient will be independent in correctly and consistently performing advanced HEP   Baseline 5/10- patient states he is not doing his exercises every day    Time 8   Period Weeks   Status On-going   PT LONG TERM GOAL #2   Title Patient will demonstrate the ability to ambulate at least 1240ft within 6 minutes with walker, improved gait mechanics, upright posture   Time 8   Period Weeks   Status On-going   PT LONG TERM GOAL #3   Title Patient will demonstrate the ability to ambulate over uneven surfaces with LRAD, improved gait mechanics, upright posture, and minimal loss of balance    Time 8   Period Weeks   Status On-going   PT LONG TERM GOAL #4   Title Patient will demonstrate the ability to maintain SLS on each leg for at least 30 seconds   Time 8   Period Weeks   Status On-going   PT LONG TERM GOAL #5   Title Patient will demonstrate the ability to perform sit to stand without use of hands from surface as low as 14 inches    Time 8   Period Weeks    Status On-going               Plan - 07/31/14 1515    Clinical Impression Statement Continued gait with quad cane today however patient demonstrated difficulty with safe use of AD; switched to single point cane and patient did display much smoother gait pattern however did require cues for proper sequencing with cane. Focused on standing exercises with single point cane today to address both functional standing strength and balance during today's session, all exercises with Min guard. Patient demonstrates reduced activity tolerance when ambulating with single point cane, possibly due to reduced external support during activity. Patient states he is starting dialysis soon; recommended that PT schedule be adjusted to accomodate dialysis schedule as soon as patient knows what it is  due to fatigue from that procedure.     Pt will benefit from skilled therapeutic intervention in order to improve on the following deficits Abnormal gait;Decreased endurance;Impaired perceived functional ability;Improper body mechanics;Decreased activity tolerance;Impaired flexibility;Postural dysfunction;Decreased balance;Decreased mobility;Decreased range of motion;Decreased coordination;Decreased safety awareness   Rehab Potential Good   PT Frequency 2x / week   PT Duration 8 weeks   PT Treatment/Interventions ADLs/Self Care Home Management;Gait training;Stair training;Functional mobility training;Patient/family education;Therapeutic activities;Therapeutic exercise;Manual techniques;Energy conservation;DME Instruction;Balance training   PT Next Visit Plan Cannalith repositioning techniques as needed.   Focus gait training with SPC  and standing exercises with single point cane.   Progress balance and functional actvities.      PT Home Exercise Plan given   Consulted and Agree with Plan of Care Patient        Problem List Patient Active Problem List   Diagnosis Date  Noted  . Stroke 07/21/2014  . CKD (chronic  kidney disease) 07/21/2014  . HLD (hyperlipidemia) 07/21/2014  . Paroxysmal atrial fibrillation 07/21/2014  . Dizziness   . Malnutrition of moderate degree 05/24/2014  . ICH (intracerebral hemorrhage) 05/23/2014  . HTN (hypertension) 05/19/2014  . Diarrhea 11/25/2013  . Fecal impaction with overflow incontinence of loose stool unlikely infectious 11/25/2013  . Acute kidney injury 11/25/2013  . Aftercare following surgery of the circulatory system, [place] 04/05/2013  . Anemia 12/08/2012  . Protein-calorie malnutrition, severe 12/06/2012  . CKD (chronic kidney disease), stage III 12/06/2012  . Thrombocytopenia, unspecified 12/06/2012  . Fall at home 12/06/2012  . Acute bronchitis 12/06/2012  . UTI (urinary tract infection) 12/06/2012  . Effusion of right knee joint 12/06/2012  . Altered mental status 12/05/2012  . Vascular dementia 12/05/2012  . CAD (coronary artery disease) 10/07/2011  . Chronic systolic CHF (congestive heart failure) 10/07/2011  . Acute respiratory disease 10/03/2011  . Non-STEMI (non-ST elevated myocardial infarction) 10/02/2011  . Hypokalemia 10/02/2011  . Abdominal aneurysm without mention of rupture 04/04/2011  . History of noncompliance with medical treatment   . Cerebrovascular disease   . Adenomatous polyp 10/19/2010  . Hypertensive heart disease without CHF   . Hyperlipidemia   . Chronic kidney disease   . Alcohol abuse   . Tobacco abuse   . Chronic obstructive pulmonary disease 02/09/2010  . History of  abdominal aortic aneurysm (stent graft)     Deniece Ree PT, DPT [place], [place], [address] Phone: [phone]   Fax:  [phone]

## 2014-08-01 ENCOUNTER — Telehealth: Payer: Self-pay | Admitting: *Deleted

## 2014-08-01 ENCOUNTER — Encounter (HOSPITAL_COMMUNITY): Payer: Self-pay | Admitting: *Deleted

## 2014-08-01 LAB — TB SKIN TEST
Induration: 0 mm
TB Skin Test: NEGATIVE

## 2014-08-01 NOTE — Telephone Encounter (Signed)
Pt's tb test results faxed to [phone] and [phone] per pt's request.

## 2014-08-04 ENCOUNTER — Other Ambulatory Visit: Payer: Self-pay | Admitting: *Deleted

## 2014-08-04 DIAGNOSIS — N186 End stage renal disease: Secondary | ICD-10-CM

## 2014-08-04 MED ORDER — SODIUM CHLORIDE 0.9 % IV SOLN: INTRAVENOUS | Status: DC

## 2014-08-04 MED ORDER — DEXTROSE 5 % IV SOLN
1.5000 g | INTRAVENOUS | Status: AC
  Administered 2014-08-05: 1.5 g via INTRAVENOUS
  Filled 2014-08-04: qty 1.5

## 2014-08-05 ENCOUNTER — Ambulatory Visit (HOSPITAL_COMMUNITY): Payer: Medicare Other | Admitting: Anesthesiology

## 2014-08-05 ENCOUNTER — Encounter (HOSPITAL_COMMUNITY): Payer: Self-pay | Admitting: General Practice

## 2014-08-05 ENCOUNTER — Encounter (HOSPITAL_COMMUNITY)
Admission: RE | Disposition: A | Discharge status: Home / Self Care | Payer: Self-pay | Source: Ambulatory Visit | Attending: Vascular Surgery

## 2014-08-05 ENCOUNTER — Ambulatory Visit (HOSPITAL_COMMUNITY): Payer: Medicare Other

## 2014-08-05 ENCOUNTER — Ambulatory Visit (HOSPITAL_COMMUNITY)
Admission: RE | Admit: 2014-08-05 | Discharge: 2014-08-05 | Disposition: A | Discharge status: Home / Self Care | Payer: Medicare Other | Source: Ambulatory Visit | Attending: Vascular Surgery | Admitting: Vascular Surgery

## 2014-08-05 DIAGNOSIS — I4891 Unspecified atrial fibrillation: Secondary | ICD-10-CM | POA: Insufficient documentation

## 2014-08-05 DIAGNOSIS — I5022 Chronic systolic (congestive) heart failure: Secondary | ICD-10-CM | POA: Diagnosis not present

## 2014-08-05 DIAGNOSIS — I2510 Atherosclerotic heart disease of native coronary artery without angina pectoris: Secondary | ICD-10-CM | POA: Diagnosis not present

## 2014-08-05 DIAGNOSIS — Z419 Encounter for procedure for purposes other than remedying health state, unspecified: Secondary | ICD-10-CM

## 2014-08-05 DIAGNOSIS — F329 Major depressive disorder, single episode, unspecified: Secondary | ICD-10-CM | POA: Diagnosis not present

## 2014-08-05 DIAGNOSIS — I252 Old myocardial infarction: Secondary | ICD-10-CM | POA: Insufficient documentation

## 2014-08-05 DIAGNOSIS — I739 Peripheral vascular disease, unspecified: Secondary | ICD-10-CM | POA: Insufficient documentation

## 2014-08-05 DIAGNOSIS — M13862 Other specified arthritis, left knee: Secondary | ICD-10-CM | POA: Diagnosis not present

## 2014-08-05 DIAGNOSIS — E785 Hyperlipidemia, unspecified: Secondary | ICD-10-CM | POA: Diagnosis not present

## 2014-08-05 DIAGNOSIS — I120 Hypertensive chronic kidney disease with stage 5 chronic kidney disease or end stage renal disease: Secondary | ICD-10-CM | POA: Insufficient documentation

## 2014-08-05 DIAGNOSIS — N186 End stage renal disease: Secondary | ICD-10-CM | POA: Diagnosis present

## 2014-08-05 DIAGNOSIS — Z79891 Long term (current) use of opiate analgesic: Secondary | ICD-10-CM | POA: Insufficient documentation

## 2014-08-05 DIAGNOSIS — Z955 Presence of coronary angioplasty implant and graft: Secondary | ICD-10-CM | POA: Diagnosis not present

## 2014-08-05 DIAGNOSIS — Z87891 Personal history of nicotine dependence: Secondary | ICD-10-CM | POA: Insufficient documentation

## 2014-08-05 DIAGNOSIS — Z7982 Long term (current) use of aspirin: Secondary | ICD-10-CM | POA: Insufficient documentation

## 2014-08-05 DIAGNOSIS — Z791 Long term (current) use of non-steroidal anti-inflammatories (NSAID): Secondary | ICD-10-CM | POA: Insufficient documentation

## 2014-08-05 DIAGNOSIS — J449 Chronic obstructive pulmonary disease, unspecified: Secondary | ICD-10-CM | POA: Insufficient documentation

## 2014-08-05 DIAGNOSIS — N185 Chronic kidney disease, stage 5: Secondary | ICD-10-CM

## 2014-08-05 DIAGNOSIS — F419 Anxiety disorder, unspecified: Secondary | ICD-10-CM | POA: Diagnosis not present

## 2014-08-05 DIAGNOSIS — Z79899 Other long term (current) drug therapy: Secondary | ICD-10-CM | POA: Diagnosis not present

## 2014-08-05 DIAGNOSIS — Z8673 Personal history of transient ischemic attack (TIA), and cerebral infarction without residual deficits: Secondary | ICD-10-CM | POA: Insufficient documentation

## 2014-08-05 DIAGNOSIS — Z992 Dependence on renal dialysis: Secondary | ICD-10-CM

## 2014-08-05 DIAGNOSIS — M13861 Other specified arthritis, right knee: Secondary | ICD-10-CM | POA: Diagnosis not present

## 2014-08-05 HISTORY — PX: INSERTION OF DIALYSIS CATHETER: SHX1324

## 2014-08-05 LAB — POCT I-STAT 4, (NA,K, GLUC, HGB,HCT)
GLUCOSE: 98 mg/dL (ref 65–99)
HCT: 32 % — ABNORMAL LOW (ref 39.0–52.0)
Hemoglobin: 10.9 g/dL — ABNORMAL LOW (ref 13.0–17.0)
Potassium: 4.8 mmol/L (ref 3.5–5.1)
SODIUM: 144 mmol/L (ref 135–145)

## 2014-08-05 SURGERY — INSERTION OF DIALYSIS CATHETER
Anesthesia: Monitor Anesthesia Care | Site: Chest | Laterality: Right

## 2014-08-05 MED ORDER — ONDANSETRON HCL 4 MG/2ML IJ SOLN
INTRAMUSCULAR | Status: DC | PRN
  Administered 2014-08-05: 4 mg via INTRAVENOUS

## 2014-08-05 MED ORDER — LIDOCAINE-EPINEPHRINE (PF) 1 %-1:200000 IJ SOLN
INTRAMUSCULAR | Status: AC
  Filled 2014-08-05: qty 10

## 2014-08-05 MED ORDER — CEFAZOLIN SODIUM-DEXTROSE 2-3 GM-% IV SOLR
INTRAVENOUS | Status: AC
  Filled 2014-08-05: qty 150

## 2014-08-05 MED ORDER — LIDOCAINE-EPINEPHRINE (PF) 1 %-1:200000 IJ SOLN
INTRAMUSCULAR | Status: DC | PRN
  Administered 2014-08-05: 30 mL

## 2014-08-05 MED ORDER — PHENYLEPHRINE HCL 10 MG/ML IJ SOLN
INTRAMUSCULAR | Status: AC
  Filled 2014-08-05: qty 1

## 2014-08-05 MED ORDER — PROPOFOL 10 MG/ML IV BOLUS
INTRAVENOUS | Status: AC
  Filled 2014-08-05: qty 20

## 2014-08-05 MED ORDER — FENTANYL CITRATE (PF) 250 MCG/5ML IJ SOLN
INTRAMUSCULAR | Status: AC
  Filled 2014-08-05: qty 5

## 2014-08-05 MED ORDER — FENTANYL CITRATE (PF) 100 MCG/2ML IJ SOLN: 25.0000 ug | INTRAMUSCULAR | Status: DC | PRN

## 2014-08-05 MED ORDER — OXYCODONE HCL 5 MG PO TABS: 5.0000 mg | ORAL_TABLET | Freq: Once | ORAL | Status: DC | PRN

## 2014-08-05 MED ORDER — SUCCINYLCHOLINE CHLORIDE 20 MG/ML IJ SOLN
INTRAMUSCULAR | Status: AC
  Filled 2014-08-05: qty 1

## 2014-08-05 MED ORDER — OXYCODONE-ACETAMINOPHEN 5-325 MG PO TABS: 1.0000 | ORAL_TABLET | Freq: Four times a day (QID) | ORAL | Status: DC | PRN

## 2014-08-05 MED ORDER — MIDAZOLAM HCL 5 MG/5ML IJ SOLN
INTRAMUSCULAR | Status: DC | PRN
  Administered 2014-08-05 (×2): 1 mg via INTRAVENOUS

## 2014-08-05 MED ORDER — HEPARIN SODIUM (PORCINE) 1000 UNIT/ML IJ SOLN
INTRAMUSCULAR | Status: DC | PRN
  Administered 2014-08-05: 4.6 mL

## 2014-08-05 MED ORDER — SODIUM CHLORIDE 0.9 % IV SOLN
INTRAVENOUS | Status: DC
  Administered 2014-08-05 (×2): via INTRAVENOUS

## 2014-08-05 MED ORDER — ONDANSETRON HCL 4 MG/2ML IJ SOLN: 4.0000 mg | Freq: Four times a day (QID) | INTRAMUSCULAR | Status: DC | PRN

## 2014-08-05 MED ORDER — FENTANYL CITRATE (PF) 100 MCG/2ML IJ SOLN
INTRAMUSCULAR | Status: DC | PRN
  Administered 2014-08-05 (×2): 50 ug via INTRAVENOUS

## 2014-08-05 MED ORDER — SODIUM CHLORIDE 0.9 % IR SOLN
Status: DC | PRN
  Administered 2014-08-05: 11:00:00

## 2014-08-05 MED ORDER — MIDAZOLAM HCL 2 MG/2ML IJ SOLN
INTRAMUSCULAR | Status: AC
  Filled 2014-08-05: qty 2

## 2014-08-05 MED ORDER — PHENYLEPHRINE 40 MCG/ML (10ML) SYRINGE FOR IV PUSH (FOR BLOOD PRESSURE SUPPORT)
PREFILLED_SYRINGE | INTRAVENOUS | Status: AC
  Filled 2014-08-05: qty 30

## 2014-08-05 MED ORDER — HEPARIN SODIUM (PORCINE) 1000 UNIT/ML IJ SOLN
INTRAMUSCULAR | Status: AC
  Filled 2014-08-05: qty 1

## 2014-08-05 MED ORDER — CHLORHEXIDINE GLUCONATE CLOTH 2 % EX PADS: 6.0000 | MEDICATED_PAD | Freq: Once | CUTANEOUS | Status: DC

## 2014-08-05 MED ORDER — OXYCODONE HCL 5 MG/5ML PO SOLN: 5.0000 mg | Freq: Once | ORAL | Status: DC | PRN

## 2014-08-05 SURGICAL SUPPLY — 41 items
BAG DECANTER FOR FLEXI CONT (MISCELLANEOUS) ×2 IMPLANT
BIOPATCH RED 1 DISK 7.0 (GAUZE/BANDAGES/DRESSINGS) ×2 IMPLANT
CATH CANNON HEMO 15F 50CM (CATHETERS) IMPLANT
CATH CANNON HEMO 15FR 19 (HEMODIALYSIS SUPPLIES) IMPLANT
CATH CANNON HEMO 15FR 23CM (HEMODIALYSIS SUPPLIES) ×2 IMPLANT
CATH CANNON HEMO 15FR 31CM (HEMODIALYSIS SUPPLIES) IMPLANT
CATH CANNON HEMO 15FR 32 (HEMODIALYSIS SUPPLIES) IMPLANT
CATH CANNON HEMO 15FR 32CM (HEMODIALYSIS SUPPLIES) IMPLANT
CATH STRAIGHT 5FR 65CM (CATHETERS) IMPLANT
COVER PROBE W GEL 5X96 (DRAPES) ×2 IMPLANT
DRAPE C-ARM 42X72 X-RAY (DRAPES) ×2 IMPLANT
DRAPE CHEST BREAST 15X10 FENES (DRAPES) ×2 IMPLANT
GAUZE SPONGE 2X2 8PLY STRL LF (GAUZE/BANDAGES/DRESSINGS) ×1 IMPLANT
GAUZE SPONGE 4X4 16PLY XRAY LF (GAUZE/BANDAGES/DRESSINGS) ×2 IMPLANT
GLOVE BIO SURGEON STRL SZ7 (GLOVE) ×2 IMPLANT
GLOVE BIOGEL PI IND STRL 7.5 (GLOVE) ×1 IMPLANT
GLOVE BIOGEL PI INDICATOR 7.5 (GLOVE) ×1
GOWN STRL REUS W/ TWL LRG LVL3 (GOWN DISPOSABLE) ×2 IMPLANT
GOWN STRL REUS W/TWL LRG LVL3 (GOWN DISPOSABLE) ×4
KIT BASIN OR (CUSTOM PROCEDURE TRAY) ×2 IMPLANT
KIT ROOM TURNOVER OR (KITS) ×2 IMPLANT
LIQUID BAND (GAUZE/BANDAGES/DRESSINGS) ×1 IMPLANT
NDL HYPO 25GX1X1/2 BEV (NEEDLE) ×1 IMPLANT
NEEDLE 18GX1X1/2 (RX/OR ONLY) (NEEDLE) ×2 IMPLANT
NEEDLE HYPO 25GX1X1/2 BEV (NEEDLE) ×2 IMPLANT
NS IRRIG 1000ML POUR BTL (IV SOLUTION) ×2 IMPLANT
PACK SURGICAL SETUP 50X90 (CUSTOM PROCEDURE TRAY) ×2 IMPLANT
PAD ARMBOARD 7.5X6 YLW CONV (MISCELLANEOUS) ×4 IMPLANT
SET MICROPUNCTURE 5F STIFF (MISCELLANEOUS) IMPLANT
SOAP 2 % CHG 4 OZ (WOUND CARE) ×2 IMPLANT
SPONGE GAUZE 2X2 STER 10/PKG (GAUZE/BANDAGES/DRESSINGS) ×1
SUT ETHILON 3 0 PS 1 (SUTURE) ×2 IMPLANT
SUT MNCRL AB 4-0 PS2 18 (SUTURE) ×2 IMPLANT
SYR 20CC LL (SYRINGE) ×4 IMPLANT
SYR 3ML LL SCALE MARK (SYRINGE) ×2 IMPLANT
SYR 5ML LL (SYRINGE) ×2 IMPLANT
SYR CONTROL 10ML LL (SYRINGE) ×2 IMPLANT
SYRINGE 10CC LL (SYRINGE) ×2 IMPLANT
TAPE CLOTH SURG 4X10 WHT LF (GAUZE/BANDAGES/DRESSINGS) ×2 IMPLANT
WATER STERILE IRR 1000ML POUR (IV SOLUTION) ×1 IMPLANT
WIRE AMPLATZ SS-J .035X180CM (WIRE) ×1 IMPLANT

## 2014-08-05 NOTE — Anesthesia Procedure Notes (Signed)
Procedure Name: MAC Date/Time: 08/05/2014 10:50 AM Performed by: Scheryl Darter Pre-anesthesia Checklist: Patient identified, Emergency Drugs available, Suction available, Patient being monitored and Timeout performed Patient Re-evaluated:Patient Re-evaluated prior to inductionOxygen Delivery Method: Nasal cannula Preoxygenation: Pre-oxygenation with 100% oxygen Intubation Type: IV induction Placement Confirmation: positive ETCO2

## 2014-08-05 NOTE — Transfer of Care (Signed)
Immediate Anesthesia Transfer of Care Note  Patient: [name]  Procedure(s) Performed: Procedure(s): INSERTION OF DIALYSIS CATHETER Right internal Jugular (Right)  Patient Location: PACU  Anesthesia Type:MAC  Level of Consciousness: awake, alert , oriented and sedated  Airway & Oxygen Therapy: Patient Spontanous Breathing and Patient connected to nasal cannula oxygen  Post-op Assessment: Report given to RN, Post -op Vital signs reviewed and stable and Patient moving all extremities  Post vital signs: Reviewed and stable  Last Vitals:  Filed Vitals:   08/05/14 0855  BP: 183/70  Pulse: 57  Temp: 36.7 C  Resp: 20    Complications: No apparent anesthesia complications

## 2014-08-05 NOTE — Op Note (Signed)
    OPERATIVE NOTE  PROCEDURE: 1. Right internal jugular vein tunneled dialysis catheter placement 2. Right internal jugular vein cannulation under ultrasound guidance  PRE-OPERATIVE DIAGNOSIS: end-stage renal failure  POST-OPERATIVE DIAGNOSIS: same as above  SURGEON: Adele Barthel, MD  ANESTHESIA: local and MAC  ESTIMATED BLOOD LOSS: 30 cc  FINDING(S): 1.  Tips of the catheter in the right atrium on fluoroscopy 2.  No obvious pneumothorax on fluoroscopy  SPECIMEN(S):  none  INDICATIONS:   [name] is a 69 y.o. male who presents with end stage renal disease.  The patient presents for tunneled dialysis catheter placement.  The patient is aware the risks of tunneled dialysis catheter placement include but are not limited to: bleeding, infection, central venous injury, pneumothorax, possible venous stenosis, possible malpositioning in the venous system, and possible infections related to long-term catheter presence.  The patient was aware of these risks and agreed to proceed.  DESCRIPTION: After written full informed consent was obtained from the patient, the patient was taken back to the operating room.  Prior to induction, the patient was given IV antibiotics.  After obtaining adequate sedation, the patient was prepped and draped in the standard fashion for a chest or neck tunneled dialysis catheter placement.   The cannulation site, the catheter exit site, and tract for the subcutaneous tunnel were then anesthestized with a total of 20 cc of a 1:1 mixture of 0.5% Marcaine without epinepherine and 1% Lidocaine with epinepherine.  Under ultrasound guidance, the right internal jugular vein was cannulated with the 18 gauge needle.  A J-wire was then placed down into the inferior vena cava under fluoroscopic guidance.  The wire was then secured in place with a clamp to the drapes.  I then made stab incisions at the neck and exit sites.   I dissected from the exit site to the cannulation  site with a metal tunneler.   The subcutaneous tunnel was dilated by passing a plastic dilator over the metal dissector. The wire was then unclamped and I removed the needle.  The skin tract and venotomy was dilated serially with dilators.  Finally, the dilator-sheath was placed under fluoroscopic guidance into the superior vena cava.  The dilator and wire were removed.  A 23 cm Diatek catheter was placed under fluoroscopic guidance down into the right atrium.  The sheath was broken and peeled away while holding the catheter cuff at the level of the skin.  The back end of this catheter was transected, revealing the two lumens of this catheter.  The ports were docked onto these two lumens.  The catheter hub was then screwed into place.  Each port was tested by aspirating and flushing.  No resistance was noted.  Each port was then thoroughly flushed with heparinized saline.  The catheter was secured in placed with two interrupted stitches of 3-0 Nylon tied to the catheter.  The neck incision was closed with a U-stitch of 4-0 Monocryl.  The neck and chest incision were cleaned and sterile bandages applied.  Each port was then loaded with concentrated heparin (1000 Units/mL) at the manufacturer recommended volumes to each port.  Sterile caps were applied to each port.  On completion fluoroscopy, the tips of the catheter were in the right atrium, and there was no evidence of pneumothorax.  COMPLICATIONS: none  CONDITION: stable   Adele Barthel, MD [facility] Office: [phone] Pager: [phone]  08/05/2014, 11:33 AM

## 2014-08-05 NOTE — H&P (Signed)
Brief History and Physical  History of Present Illness  [name] is a 69 y.o. male who presents with chief complaint: ESRD.  The patient presents today for tunneled dialysis catheter placement.  Patient had a LUA AVF placed by [name] last month.  The fistula is not ready for use yet so [name] placement was requested by nephrology.    Past Medical History  Diagnosis Date  . Hypertension   . COPD (chronic obstructive pulmonary disease)   . Hyperlipidemia   . Cardiomyopathy     EF of 30% per echo in June of 2012; admitted with congestive heart failure; nonobstructive CAD on cath in 08/2010  . History of noncompliance with medical treatment   . Cerebrovascular disease 2011    CVA  in 02/2010-only deficit is decreased vision in  right eye  . Alcohol abuse   . Tobacco abuse     40 pack years  . Abdominal aortic aneurysm     Stent graft repair  . Chronic systolic CHF (congestive heart failure)   . Leg pain   . CKD (chronic kidney disease) stage 3, GFR 30-59 ml/min     creatinine-1.7 in 08/2010  . CAD (coronary artery disease)   . Myocardial infarction acute 10/01/11  . CKD (chronic kidney disease), stage III 12/06/2012  . Effusion of right knee joint 12/06/2012  . Anemia   . Atrial fibrillation   . Ataxic gait   . Weakness generalized   . Frequent falls   . Forgetfulness   . Stroke 2011    decreased vision of right eye    Most recent 05/23/14 - bleed - has a drag to his left leg  . Depression   . Anxiety     Takes Citalopram  . Arthritis     knees    Past Surgical History  Procedure Laterality Date  . Colonoscopy w/ polypectomy  2006    Dr. Tamala Julian: anal fissure, sessile adenomatous polyp, diverticulosis  . Esophagogastroduodenoscopy  11/15/2010    Procedure: ESOPHAGOGASTRODUODENOSCOPY (EGD);  Surgeon: Dorothyann Peng, MD;  Location: [facility];  Service: Endoscopy;  Laterality: N/A;  with propofol sedation; procedure start @ 0813  . Flexible sigmoidoscopy  11/15/2010   Procedure: FLEXIBLE SIGMOIDOSCOPY;  Surgeon: Dorothyann Peng, MD;  Location: [facility];  Service: Endoscopy;  Laterality: N/A;  with propofol sedation; ended @ 0808  . Polypectomy  12/13/2010    Procedure: POLYPECTOMY;  Surgeon: Dorothyann Peng, MD;  Location: [facility];  Service: Endoscopy;  Laterality: N/A;  right colon, cecal, transverse, and sigmoid polypectomy  . Cardiac catheterization  10/04/11    Normal left main, 95% pLAD stenosis with ulceration s/p successful BMS placement, 30% segmental plaque beyond this, dLAD after D2 with 50% plaquing, then focal 70% lesion, 80% focal short D2 stenosis, 30% pOM2 lesion, 30-40% mOM3 stenosis, Shephard's crook region of RCA with 50% lesion, mRCA 50-60% lesion and mild dRCA irregularities.   . Coronary stent placement  10/04/11  . Abdominal aortic aneurysm repair w/ endoluminal graft      01/16/2008  . Left heart catheterization with coronary angiogram N/A 10/04/2011    Procedure: LEFT HEART CATHETERIZATION WITH CORONARY ANGIOGRAM;  Surgeon: Hillary Bow, MD;  Location: [facility] CATH LAB;  Service: Cardiovascular;  Laterality: N/A;  . Av fistula placement Left 07/07/2014    Procedure: Creation of Left Arm ARTERIOVENOUS Fistula;  Surgeon: Rosetta Posner, MD;  Location: [facility];  Service: Vascular;  Laterality: Left;  History   Social History  . Marital Status: Married    Spouse Name: N/A  . Number of Children: N/A  . Years of Education: N/A   Occupational History  . retired     [employer]   Social History Main Topics  . Smoking status: Former Smoker -- 0.50 packs/day for 50 years    Types: Cigarettes    Start date: 06/25/1957    Quit date: 04/04/2014  . Smokeless tobacco: Never Used  . Alcohol Use: No     Comment: quit about 1 month ago, prior to this would drink about 7 oz of liquor per day 10/2012  . Drug Use: No  . Sexual Activity: No   Other Topics Concern  . Not on file   Social History Narrative   Married, retired from [employer], 6  children   Right handed   Caffeine use - none    Family History  Problem Relation Age of Onset  . Colon cancer Neg Hx   . Anesthesia problems Neg Hx   . Hypotension Neg Hx   . Malignant hyperthermia Neg Hx   . Pseudochol deficiency Neg Hx   . Cancer Mother     Gall Bladder  . Heart disease Mother     before age 19  . Hyperlipidemia Mother   . Hypertension Mother   . Asthma Mother   . Cancer Sister     Cervical    No current facility-administered medications on file prior to encounter.   Current Outpatient Prescriptions on File Prior to Encounter  Medication Sig Dispense Refill  . amLODipine (NORVASC) 10 MG tablet Take 1 tablet (10 mg total) by mouth daily. 30 tablet 6  . aspirin EC 81 MG tablet Take 81 mg by mouth daily.    Marland Kitchen atorvastatin (LIPITOR) 80 MG tablet TAKE 1 TABLET (80 MG TOTAL) BY MOUTH DAILY AT 6 PM. 90 tablet 3  . carvedilol (COREG) 3.125 MG tablet Take 1 tablet (3.125 mg total) by mouth 2 (two) times daily. 60 tablet 6  . citalopram (CELEXA) 10 MG tablet Take 10 mg by mouth daily.  4  . cloNIDine (CATAPRES) 0.1 MG tablet Take 1 tablet (0.1 mg total) by mouth 2 (two) times daily. 60 tablet 3  . ezetimibe (ZETIA) 10 MG tablet Take 1 tablet (10 mg total) by mouth daily. 30 tablet 6  . feeding supplement, ENSURE COMPLETE, (ENSURE COMPLETE) LIQD Take 237 mLs by mouth daily at 12 noon. 30 Bottle 3  . hydrALAZINE (APRESOLINE) 50 MG tablet Take 1 tablet (50 mg total) by mouth 3 (three) times daily. 90 tablet 3  . Multiple Vitamin (MULTIVITAMIN WITH MINERALS) TABS Take 1 tablet by mouth daily.    . pantoprazole (PROTONIX) 40 MG tablet Take 1 tablet (40 mg total) by mouth daily. 30 tablet 11  . polyethylene glycol (MIRALAX / GLYCOLAX) packet Take 17 g by mouth daily. 14 each 0  . oxyCODONE (ROXICODONE) 5 MG immediate release tablet Take 1 tablet (5 mg total) by mouth every 6 (six) hours as needed. 20 tablet 0  . VOLTAREN 1 % GEL Apply 4 g topically daily as needed (for  knee pain). Bilateral Knee  1  . [DISCONTINUED] metoprolol (TOPROL-XL) 50 MG 24 hr tablet Take 50 mg by mouth daily.      . [DISCONTINUED] niacin (NIASPAN) 500 MG CR tablet Take 500 mg by mouth at bedtime.      . [DISCONTINUED] omeprazole (PRILOSEC) 20 MG capsule 1 po every  morning 30 capsule 5    No Known Allergies  Review of Systems: As listed above, otherwise negative.  Physical Examination  Filed Vitals:   08/05/14 0825 08/05/14 0855  BP:  183/70  Pulse:  57  Temp:  98 F (36.7 C)  TempSrc:  Oral  Resp:  20  Height: 6\' 1"  (1.854 m) 6\' 1"  (1.854 m)  Weight: 172 lb 12.8 oz (78.382 kg) 172 lb (78.019 kg)  SpO2:  100%    General: A&O x 3, WDWN  Pulmonary: Sym exp, good air movt, CTAB, no rales, rhonchi, & wheezing   Cardiac: RRR, Nl S1, S2, no Murmurs, rubs or gallops  Gastrointestinal: soft, NTND, -G/R, - HSM, - masses, - CVAT B  Musculoskeletal: M/S 5/5 throughout , Extremities without ischemic changes   Laboratory See iStat  Medical Decision Making  [name] is a 69 y.o. male who presents with: ESRD.   The patient is scheduled for: [facility] placement The patient is aware the risks of tunneled dialysis catheter placement include but are not limited to: bleeding, infection, central venous injury, pneumothorax, possible venous stenosis, possible malpositioning in the venous system, and possible infections related to long-term catheter presence.   The patient is aware of the risks and agrees to proceed.  Adele Barthel, MD [facility] Office: [phone] Pager: [phone]  08/05/2014, 9:58 AM

## 2014-08-05 NOTE — Anesthesia Preprocedure Evaluation (Signed)
Anesthesia Evaluation  Patient identified by MRN, date of birth, ID band Patient awake    Reviewed: Allergy & Precautions, NPO status , Patient's Chart, lab work & pertinent test results  Airway Mallampati: II   Neck ROM: full    Dental   Pulmonary COPDformer smoker,  breath sounds clear to auscultation        Cardiovascular hypertension, + CAD, + Past MI, + Peripheral Vascular Disease and +CHF + dysrhythmias Atrial Fibrillation Rhythm:regular Rate:Normal     Neuro/Psych Anxiety Depression CVA    GI/Hepatic   Endo/Other    Renal/GU ESRFRenal disease     Musculoskeletal  (+) Arthritis -,   Abdominal   Peds  Hematology   Anesthesia Other Findings   Reproductive/Obstetrics                             Anesthesia Physical Anesthesia Plan  ASA: III  Anesthesia Plan: MAC   Post-op Pain Management:    Induction: Intravenous  Airway Management Planned: Simple Face Mask  Additional Equipment:   Intra-op Plan:   Post-operative Plan:   Informed Consent: I have reviewed the patients History and Physical, chart, labs and discussed the procedure including the risks, benefits and alternatives for the proposed anesthesia with the patient or authorized representative who has indicated his/her understanding and acceptance.     Plan Discussed with: CRNA, Anesthesiologist and Surgeon  Anesthesia Plan Comments:         Anesthesia Quick Evaluation

## 2014-08-05 NOTE — Progress Notes (Signed)
Patient states that he has been nauseous the past week but increased this morning with no vomiting.

## 2014-08-06 ENCOUNTER — Encounter (HOSPITAL_COMMUNITY): Payer: Self-pay | Admitting: Vascular Surgery

## 2014-08-08 ENCOUNTER — Encounter: Payer: Self-pay | Admitting: Vascular Surgery

## 2014-08-12 ENCOUNTER — Encounter: Payer: Self-pay | Admitting: Vascular Surgery

## 2014-08-12 ENCOUNTER — Ambulatory Visit (INDEPENDENT_AMBULATORY_CARE_PROVIDER_SITE_OTHER): Payer: Self-pay | Admitting: Vascular Surgery

## 2014-08-12 ENCOUNTER — Ambulatory Visit (HOSPITAL_COMMUNITY)
Admission: RE | Admit: 2014-08-12 | Discharge: 2014-08-12 | Disposition: A | Discharge status: Home / Self Care | Payer: Medicare Other | Source: Ambulatory Visit | Attending: Vascular Surgery | Admitting: Vascular Surgery

## 2014-08-12 VITALS — BP 124/65 | HR 69 | Resp 16 | Ht 75.0 in | Wt 163.0 lb

## 2014-08-12 DIAGNOSIS — Z4931 Encounter for adequacy testing for hemodialysis: Secondary | ICD-10-CM | POA: Insufficient documentation

## 2014-08-12 DIAGNOSIS — N186 End stage renal disease: Secondary | ICD-10-CM

## 2014-08-12 NOTE — Anesthesia Postprocedure Evaluation (Signed)
Anesthesia Post Note  Patient: [name]  Procedure(s) Performed: Procedure(s) (LRB): INSERTION OF DIALYSIS CATHETER Right internal Jugular (Right)  Anesthesia type: MAC  Patient location: PACU  Post pain: Pain level controlled and Adequate analgesia  Post assessment: Post-op Vital signs reviewed, Patient's Cardiovascular Status Stable and Respiratory Function Stable  Last Vitals:  Filed Vitals:   08/05/14 1245  BP: 163/73  Pulse: 64  Temp: 36.7 C  Resp: 15    Post vital signs: Reviewed and stable  Level of consciousness: awake, alert  and oriented  Complications: No apparent anesthesia complications

## 2014-08-12 NOTE — Progress Notes (Signed)
Patient presents today for follow-up of left brachiocephalic AV fistula creation by myself on 07/07/2014. He subsequently onto incision renal disease and is now being dialyzed via a right IJ catheter. He and his wife report no knowledge of any difficulty with the catheter.  On physical exam his left antecubital incision is healed quite nicely. He has a beautiful AV fistula. He has extremely nice size maturation. The vein is very straight in its course. It is very superficial to the skin. There is an excellent thrill and he has 2+ radial pulse with no evidence of steal  Impression and plan very nice Virginie Josten maturation 5 weeks out from AV fistula creation. I would wait a total of 3 months if possible to begin using the fistula which would be around July 18. I did explain that there was difficulty with the catheter at this could be used at 2 months which would be around June 18. He will continue follow-up with Dr. [name] for aneurysm stent graft repair. He will see me again on an as-needed basis

## 2014-08-14 ENCOUNTER — Telehealth: Payer: Self-pay

## 2014-08-14 ENCOUNTER — Ambulatory Visit (INDEPENDENT_AMBULATORY_CARE_PROVIDER_SITE_OTHER): Payer: Medicare Other | Admitting: Family Medicine

## 2014-08-14 ENCOUNTER — Encounter: Payer: Self-pay | Admitting: Family Medicine

## 2014-08-14 VITALS — BP 92/60 | Ht 75.0 in | Wt 162.0 lb

## 2014-08-14 DIAGNOSIS — I10 Essential (primary) hypertension: Secondary | ICD-10-CM

## 2014-08-14 DIAGNOSIS — I951 Orthostatic hypotension: Secondary | ICD-10-CM

## 2014-08-14 DIAGNOSIS — N185 Chronic kidney disease, stage 5: Secondary | ICD-10-CM | POA: Diagnosis not present

## 2014-08-14 DIAGNOSIS — L98491 Non-pressure chronic ulcer of skin of other sites limited to breakdown of skin: Secondary | ICD-10-CM

## 2014-08-14 NOTE — Progress Notes (Signed)
   Subjective:    Patient ID: [name], male    DOB: 12/14/1945, 69 y.o.   MRN: [id number]  HPI Patient arrives for a follow up on blood pressure. Patient just started dialysis 3 times a week. Needs handicap formed filled out.  it is stated that the patient doesn't do much activity he feels dizzy and weak when he stands up he is been feeling tired he also feels rundown in addition to this not eating well and also taking a lot of medicine that is been prescribed for his kidneys. He denies rectal bleeding denies vomiting denies fevers chills cough wheezing or difficulty breathing  Review of Systems  see above no night sweats    Objective:   Physical Exam  heart regular lungs clear abdomen soft extremities no edema skin warm dry chest wall he does have a catheter for dialysis in the upper right chest it does not appear to be infected but there is an area about a half inch above the catheter that seems to be ulcer in the skin with a blood clot family does not know of any injury to this area.   all of these areas were covered in detail greater than 25 minutes spent patient     Assessment & Plan:   this patient does have ulcerative area on the upper right chest that appears to have a blood clot associated with it keeps bleeding every time they try to remove it. This occurred after putting in the catheter. Follow-up with vein and vascular in [facility] for further evaluation of this early next week if bleeding problems proceed immediately to [facility] ER.   Hypertension I believe patient on too much medication currently with having dialysis reduce hydralazine to half tablet 3 times a day follow-up 4-6 weeks monitor blood pressure at dialysis also follow-up with kidney Dr.    weight is tenuous strength is somewhat weak a short was encouraged better nutrition to try to bring this up. He denies any chest tightness pressure pain denies rectal bleeding

## 2014-08-14 NOTE — Telephone Encounter (Signed)
Rec'd phone call from Dr. [name].  Requested appt. within next week for evaluation of Right IJ Cath site.  Reported an area approx. 1/2 " away from the cath insertion site, that has developed a large blood clot, that intermittently bleeds.  Wife reported to Dr. Wolfgang Phoenix that she applies intermittent pressure to stop the bleeding.  Dr. Wolfgang Phoenix reported the blood clot is approx. 3-4 mm in diam. x 1-2 mm deep; non-pulsatile.   Appt. given for 08/19/14 @ 3:15 PM.  Dr. Wolfgang Phoenix will inform the pt. to go the [facility] ER, if worsening bleeding occurs prior to appt.

## 2014-08-15 ENCOUNTER — Ambulatory Visit: Payer: Medicare Other | Admitting: Family Medicine

## 2014-08-15 ENCOUNTER — Encounter: Payer: Self-pay | Admitting: Vascular Surgery

## 2014-08-19 ENCOUNTER — Ambulatory Visit (INDEPENDENT_AMBULATORY_CARE_PROVIDER_SITE_OTHER): Payer: Self-pay | Admitting: Vascular Surgery

## 2014-08-19 ENCOUNTER — Encounter: Payer: Self-pay | Admitting: Vascular Surgery

## 2014-08-19 VITALS — BP 117/68 | HR 71 | Resp 18 | Ht 75.0 in | Wt 163.6 lb

## 2014-08-19 DIAGNOSIS — N186 End stage renal disease: Secondary | ICD-10-CM

## 2014-08-19 NOTE — Progress Notes (Signed)
    Post-Operative Access  History of Present Illness  [name] is a 69 y.o. (06-29-1945) male who presents for evaluation of bleeding at area above right IJ tunneled dialysis catheter exit site. He was seen in the office last week on 08/12/14 by [name] for follow-up of his left upper arm AV fistula. He saw his PCP, Dr. Wolfgang Phoenix on 08/14/14 who referred the patient back here for evaluation with concerns for a blood clot. The patient's wife noted significant bleeding from the area that soiled the pillows one night prior to seeing Dr. Wolfgang Phoenix. He denies any purulent drainage from the area. He denies any further bleeding. There have been no complications with using the [facility] for dialysis MWF.   Physical Examination  Filed Vitals:   08/19/14 1529  BP: 117/68  Pulse: 71  Resp: 18  Height: 6\' 3"  (1.905 m)  Weight: 163 lb 9.6 oz (74.208 kg)   Body mass index is 20.45 kg/(m^2).  General: A&O x 3, WDWN male in NAD Incisions: right IJ TDC dressing clean. Neck incision site clean without evidence of infection or drainage. No ulceration.  LUE: palpable thrill in left AVF  Assessment/Plan S/p left upper arm AVF and right IJ TDC.   The patient's left neck incision site had bleeding that is now resolved. He likely had some skin edge bleeding. This incision only involved the skin with no venous involvement. The patient is using his [facility] without complication. His left AV fistula has an easily palpable thrill. This may be used in 3 months.   Virgina Jock, PA-C [facility] Office: [phone] Pager: [phone]  08/19/2014, 4:07 PM   This patient was seen in conjunction with [name]  I have examined the patient, reviewed and agree with above. Patient and wife reassured. Had the skin edge bleeder at the insertion site. No evidence of problems.  Curt Jews, MD 08/19/2014 4:29 PM

## 2014-09-10 ENCOUNTER — Other Ambulatory Visit: Payer: Self-pay | Admitting: Family Medicine

## 2014-09-24 ENCOUNTER — Encounter: Payer: Self-pay | Admitting: *Deleted

## 2014-10-07 ENCOUNTER — Ambulatory Visit (INDEPENDENT_AMBULATORY_CARE_PROVIDER_SITE_OTHER): Payer: Medicare Other | Admitting: Neurology

## 2014-10-07 ENCOUNTER — Encounter: Payer: Self-pay | Admitting: Neurology

## 2014-10-07 VITALS — BP 102/62 | HR 59 | Ht 75.0 in | Wt 163.6 lb

## 2014-10-07 DIAGNOSIS — G3184 Mild cognitive impairment, so stated: Secondary | ICD-10-CM | POA: Insufficient documentation

## 2014-10-07 DIAGNOSIS — F0390 Unspecified dementia without behavioral disturbance: Secondary | ICD-10-CM | POA: Diagnosis not present

## 2014-10-07 DIAGNOSIS — R413 Other amnesia: Secondary | ICD-10-CM

## 2014-10-07 NOTE — Progress Notes (Signed)
STROKE NEUROLOGY FOLLOW UP NOTE  NAME: [name] DOB: 1945-06-05  REASON FOR VISIT: stroke follow up HISTORY FROM: chart and wife  Today we had the pleasure of seeing [name] in follow-up at our Neurology Clinic. Pt was accompanied by wife.   History Summary [name] is a 69 y.o. male hx of prior CVA, multiple TIAs, A. Fib on ASA 81, HTN, HLD, cardiomyopathy, CKD, CAD, AAA s/p repair was admitted to [facility] on 05/23/14 for acute onset of dizziness and generalized weakness that subsequently resolved. Head CT and MRI completed shows questionable small acute right IC ICH vs. Hemorrhagic infarct.His ASA 81mg  at home was discontinued due to bleeding. His blood pressure was tightly controlled. He was in the intensive care for 2 days and remained neurologically stable. He had no focal deficits but had mild gait imbalance. He was seen by PT/OT and considered stable for discharge home. He was advised to be compliant with his BP meds and keep follow-up with primary care physician.   Iast visit [name] 07/21/2014 :  During the interval time, the patient has been doing well. He has PT/OT twice a week. He has been back on ASA 81mg . However, his BP still not in good control today 174/64. Wife said his Bp at home also 154-176/70-80. He has been on amlodipine, coreg, clonidine and hydralazine but still difficult in control. His CKD gradually getting worse and recently got fistular done by [name] for preparation of HD in the future. Wife said he lack of motivation, he not moving much at home and not doing exercise. He went to rehab but came back directly go to bed.   As per wife, he has been followed up with [name] in 2007 or 2008 for his previous stroke. Since then he had numerous TIAs but did not keep following up with [name]. He was seen by [name] again this March in [place] and was asked to follow up with [name] in clinic 2 months after discharge.    Update 10/07/2014 : He returns  for follow-up today after last visit with Dr. Erlinda Hong 2-1/2 months ago. He is accountable to Coumadin by his wife who states that they've noticed her memory difficulties which seem now more pronounced. He has trouble understanding and comprehending and at times is unable to do even simple things. He has started dialysis since May this year. His blood pressure is been running low and today it is 102/62. He does use a walker and has not had any falls. There has been no issues with agitation, delusions, hallucinations. On Mini-Mental status exam today scored 26/30 with animal naming test only 5 and clock drawing 4/4.  REVIEW OF SYSTEMS: Full 14 system review of systems performed and notable only for those listed below and in HPI above, all others are negative:   Blurred vision, cough, memory loss, balance difficulties The following represents the patient's updated allergies and side effects list: No Known Allergies  The neurologically relevant items on the patient's problem list were reviewed on today's visit.  Neurologic Examination  A problem focused neurological exam (12 or more points of the single system neurologic examination, vital signs counts as 1 point, cranial nerves count for 8 points) was performed.  Blood pressure 102/62, pulse 59, height 6\' 3"  (1.905 m), weight 163 lb 9.6 oz (74.208 kg).  General - Well nourished, well developed, in no apparent distress.  Ophthalmologic - fundi not visualized    Cardiovascular -  Regular rate and rhythm, no irregular heart beat.  Mental Status -  Level of arousal and orientation to time, place, and person were intact. Language including expression, naming, repetition, comprehension was assessed and found intact. Fund of Knowledge was assessed and was impaired without knowing previous president. MMSE 26/30 with deficits in orientation and recall. Clock drawing 4/4 and AFT 5 only. Cranial Nerves II - XII - II - Visual field intact OU. III, IV, VI -  Extraocular movements intact. V - Facial sensation intact bilaterally. VII - Facial movement intact bilaterally. VIII - Hearing & vestibular intact bilaterally. X - Palate elevates symmetrically. XI - Chin turning & shoulder shrug intact bilaterally. XII - Tongue protrusion intact.  Motor Strength - The patient's strength was normal in all extremities and pronator drift was absent.  Bulk was normal and fasciculations were absent.   Motor Tone - Muscle tone was assessed at the neck and appendages and was normal.  Reflexes - The patient's reflexes were 1+ in all extremities and he had no pathological reflexes.  Sensory - Light touch, temperature/pinprick were assessed and were normal.    Coordination - The patient had normal movements in the hands with no ataxia or dysmetria.  Tremor was absent.  Gait and Station - walk with walker, slow and small stride, slow turns, but steady.  Data reviewed: I personally reviewed the images and agree with the radiology interpretations.  Dg Chest 2 View 05/23/2014  Severe emphysema without acute cardiopulmonary disease.   Ct Head Wo Contrast 05/23/2014  4 mm focus of acute hemorrhage posterior limb internal capsule on the right as suggested by MRI performed earlier today.   05/25/14 1. Slight interval decrease in size of subcentimeter focal hemorrhage involving the posterior limb of the right internal capsule, now 3 mm. 2. No new intracranial abnormality.  Mr Brain Wo Contrast  05/23/2014  Cannot exclude acute RIGHT posterior limb internal capsule/anterior thalamic hemorrhage, with or without associated acute ischemia. CT scan without contrast recommended. See discussion above. Possible acute RIGHT internal capsule infarct more inferiorly as seen best on coronal diffusion sequence. Correlate clinically for LEFT-sided weakness. Advanced atrophy and chronic microvascular ischemic change, with numerous microbleeds which have developed since  previous brain MR in 2014, most of which however are likely chronic. Widespread areas of chronic lacunar infarction and BILATERAL cerebellar hemispheric infarction.   US Abdomen Complete 05/23/2014  1. Cholelithiasis without evidence of cholecystitis.  2. Chronic kidney disease with asymmetric atrophy of the right kidney.  3. No acute findings identified.   CUS - Bilateral: 1-39% ICA stenosis. Vertebral artery flow is antegrade.  2D echo - - Left ventricle: The cavity size was normal. There was moderate concentric hypertrophy. Systolic function was normal. The estimated ejection fraction was in the range of 50% to 55%. Wall motion was normal; there were no regional wall motion abnormalities. Doppler parameters are consistent with abnormal left ventricular relaxation (grade 1 diastolic dysfunction). Doppler parameters are consistent with high ventricular filling pressure. - Ventricular septum: Septal motion showed abnormal function and dyssynergy. These changes are consistent with intraventricular conduction delay. - Aortic valve: Mildly thickened, mildly calcified leaflets. There was moderate regurgitation. - Mitral valve: There was trivial regurgitation. - Left atrium: The atrium was moderately dilated. Volume/bsa, ES (1-plane Simpson&'s, A4C): 37.1 ml/m^2. - Right atrium: The atrium was mildly dilated. - Tricuspid valve: There was trivial regurgitation.  Assessment:, he is a 69 y.o. African American male with PMH of prior CVA, multiple TIAs, A. Fib  on ASA 81, HTN, HLD, cardiomyopathy, CKD, CAD, AAA s/p repair was admitted to [facility] on 05/23/14 for questionable small acute right IC ICH vs. Hemorrhagic infarct . 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 Plan:  I had a long d/w patient and his wife about his remote stroke & TIAs, risk for recurrent stroke/TIAs, personally independently reviewed imaging studies and stroke  evaluation results and answered questions.Continue aspirin 81 mg orally every day  for secondary stroke prevention  despite history of atrial fibrillation as I feel his high risk for bleeding given prior history of hemorrhagic strokes, brain MRI scan showing extensive changes of small vessel disease including microhemorrhages and fall risk  .Maintain strict control of hypertension with blood pressure goal below 130/90, diabetes with hemoglobin A1c goal below 6.5% and lipids with LDL cholesterol goal below 100 mg/dL. I also discussed with the patient and wife his cognitive impairment and early dementia and treatment options including Aricept. The patient's wife would like to hold off on that for now. I recommend starting fish oil 2 capsules daily. Followup in the future with me in  3 months  No orders of the defined types were placed in this encounter.                                                      Patient Instructions    Antony Contras, MD [facility] [address] [place], [place] [address] [phone]

## 2014-10-07 NOTE — Patient Instructions (Signed)
I had a long d/w patient and his wife about his remote stroke & TIAs, risk for recurrent stroke/TIAs, personally independently reviewed imaging studies and stroke evaluation results and answered questions.Continue aspirin 81 mg orally every day  for secondary stroke prevention  despite history of atrial fibrillation as I feel his high risk for bleeding given prior history of hemorrhagic strokes, brain MRI scan showing extensive changes of small vessel disease including microhemorrhages and fall risk  .Maintain strict control of hypertension with blood pressure goal below 130/90, diabetes with hemoglobin A1c goal below 6.5% and lipids with LDL cholesterol goal below 100 mg/dL. I also discussed with the patient and wife his cognitive impairment and early dementia and treatment options including Aricept. The patient's wife would like to hold off on that for now. I recommend starting fish oil 2 capsules daily. Followup in the future with me in  3 months

## 2014-10-14 ENCOUNTER — Encounter: Payer: Self-pay | Admitting: Cardiovascular Disease

## 2014-10-14 ENCOUNTER — Ambulatory Visit (INDEPENDENT_AMBULATORY_CARE_PROVIDER_SITE_OTHER): Payer: Medicare Other | Admitting: Cardiovascular Disease

## 2014-10-14 VITALS — BP 100/68 | HR 65 | Ht 75.0 in | Wt 159.0 lb

## 2014-10-14 DIAGNOSIS — Z87898 Personal history of other specified conditions: Secondary | ICD-10-CM

## 2014-10-14 DIAGNOSIS — Z9289 Personal history of other medical treatment: Secondary | ICD-10-CM

## 2014-10-14 DIAGNOSIS — I1310 Hypertensive heart and chronic kidney disease without heart failure, with stage 1 through stage 4 chronic kidney disease, or unspecified chronic kidney disease: Secondary | ICD-10-CM | POA: Diagnosis not present

## 2014-10-14 DIAGNOSIS — N186 End stage renal disease: Secondary | ICD-10-CM

## 2014-10-14 DIAGNOSIS — I2510 Atherosclerotic heart disease of native coronary artery without angina pectoris: Secondary | ICD-10-CM

## 2014-10-14 DIAGNOSIS — I5022 Chronic systolic (congestive) heart failure: Secondary | ICD-10-CM

## 2014-10-14 NOTE — Patient Instructions (Signed)
Continue all current medications. Your physician wants you to follow up in: 6 months.  You will receive a reminder letter in the mail one-two months in advance.  If you don't receive a letter, please call our office to schedule the follow up appointment   

## 2014-10-14 NOTE — Progress Notes (Signed)
Patient ID: [name], male   DOB: 04-Mar-1946, 69 y.o.   MRN: [id number]      SUBJECTIVE: The patient returns for follow up of CAD, chronic systolic heart failure, hyperlipidemia, and HTN. He has a history of multiple TIAs and takes aspirin 81 mg as he is deemed to be a high risk for intracranial bleeding due to prior history of hemorrhagic strokes and he is a high fall risk.  He underwent bare-metal stent placement to the LAD in July 2013. He had moderate residual RCA disease of the mid vessel 50-60% lesion at that time.  He denies chest pain, leg swelling, and shortness of breath. He sustained a CVA in March 2016. He began hemodialysis in May.  Echocardiogram on 05/24/14 showed LVEF improved to 50-55% (EF 35-40% on 10/02/11) with grade 1 diastolic dysfunction, high filling pressures, and moderate aortic regurgitation..   Review of Systems: As per "subjective", otherwise negative.  No Known Allergies  Current Outpatient Prescriptions  Medication Sig Dispense Refill  . amLODipine (NORVASC) 5 MG tablet Take 5 mg by mouth daily.  3  . aspirin EC 81 MG tablet Take 81 mg by mouth daily.    Marland Kitchen atorvastatin (LIPITOR) 80 MG tablet TAKE 1 TABLET (80 MG TOTAL) BY MOUTH DAILY AT 6 PM. 90 tablet 3  . carvedilol (COREG) 3.125 MG tablet Take 1 tablet (3.125 mg total) by mouth 2 (two) times daily. 60 tablet 6  . citalopram (CELEXA) 10 MG tablet Take 10 mg by mouth daily.  4  . cloNIDine (CATAPRES) 0.1 MG tablet TAKE 1 TABLET (0.1 MG TOTAL) BY MOUTH 2 (TWO) TIMES DAILY. 60 tablet 5  . ezetimibe (ZETIA) 10 MG tablet Take 1 tablet (10 mg total) by mouth daily. 30 tablet 6  . feeding supplement, ENSURE COMPLETE, (ENSURE COMPLETE) LIQD Take 237 mLs by mouth daily at 12 noon. 30 Bottle 3  . Multiple Vitamin (MULTIVITAMIN WITH MINERALS) TABS Take 1 tablet by mouth daily.    . pantoprazole (PROTONIX) 40 MG tablet Take 1 tablet (40 mg total) by mouth daily. 30 tablet 11  . polyethylene glycol (MIRALAX /  GLYCOLAX) packet Take 17 g by mouth daily. 14 each 0  . VOLTAREN 1 % GEL Apply 4 g topically daily as needed (for knee pain). Bilateral Knee  1  . [DISCONTINUED] metoprolol (TOPROL-XL) 50 MG 24 hr tablet Take 50 mg by mouth daily.      . [DISCONTINUED] niacin (NIASPAN) 500 MG CR tablet Take 500 mg by mouth at bedtime.      . [DISCONTINUED] omeprazole (PRILOSEC) 20 MG capsule 1 po every morning 30 capsule 5   No current facility-administered medications for this visit.    Past Medical History  Diagnosis Date  . Hypertension   . COPD (chronic obstructive pulmonary disease)   . Hyperlipidemia   . Cardiomyopathy     EF of 30% per echo in June of 2012; admitted with congestive heart failure; nonobstructive CAD on cath in 08/2010  . History of noncompliance with medical treatment   . Cerebrovascular disease 2011    CVA  in 02/2010-only deficit is decreased vision in  right eye  . Alcohol abuse   . Tobacco abuse     40 pack years  . Abdominal aortic aneurysm     Stent graft repair  . Chronic systolic CHF (congestive heart failure)   . Leg pain   . CKD (chronic kidney disease) stage 3, GFR 30-59 ml/min     creatinine-1.7  in 08/2010  . CAD (coronary artery disease)   . Myocardial infarction acute 10/01/11  . CKD (chronic kidney disease), stage III 12/06/2012  . Effusion of right knee joint 12/06/2012  . Anemia   . Atrial fibrillation   . Ataxic gait   . Weakness generalized   . Frequent falls   . Forgetfulness   . Stroke 2011    decreased vision of right eye    Most recent 05/23/14 - bleed - has a drag to his left leg  . Depression   . Anxiety     Takes Citalopram  . Arthritis     knees    Past Surgical History  Procedure Laterality Date  . Colonoscopy w/ polypectomy  2006    Dr. Tamala Julian: anal fissure, sessile adenomatous polyp, diverticulosis  . Esophagogastroduodenoscopy  11/15/2010    Procedure: ESOPHAGOGASTRODUODENOSCOPY (EGD);  Surgeon: Dorothyann Peng, MD;  Location: [facility];   Service: Endoscopy;  Laterality: N/A;  with propofol sedation; procedure start @ 0813  . Flexible sigmoidoscopy  11/15/2010    Procedure: FLEXIBLE SIGMOIDOSCOPY;  Surgeon: Dorothyann Peng, MD;  Location: [facility];  Service: Endoscopy;  Laterality: N/A;  with propofol sedation; ended @ 0808  . Polypectomy  12/13/2010    Procedure: POLYPECTOMY;  Surgeon: Dorothyann Peng, MD;  Location: [facility];  Service: Endoscopy;  Laterality: N/A;  right colon, cecal, transverse, and sigmoid polypectomy  . Cardiac catheterization  10/04/11    Normal left main, 95% pLAD stenosis with ulceration s/p successful BMS placement, 30% segmental plaque beyond this, dLAD after D2 with 50% plaquing, then focal 70% lesion, 80% focal short D2 stenosis, 30% pOM2 lesion, 30-40% mOM3 stenosis, Shephard's crook region of RCA with 50% lesion, mRCA 50-60% lesion and mild dRCA irregularities.   . Coronary stent placement  10/04/11  . Abdominal aortic aneurysm repair w/ endoluminal graft      01/16/2008  . Left heart catheterization with coronary angiogram N/A 10/04/2011    Procedure: LEFT HEART CATHETERIZATION WITH CORONARY ANGIOGRAM;  Surgeon: Hillary Bow, MD;  Location: [facility] CATH LAB;  Service: Cardiovascular;  Laterality: N/A;  . Av fistula placement Left 07/07/2014    Procedure: Creation of Left Arm ARTERIOVENOUS Fistula;  Surgeon: Rosetta Posner, MD;  Location: [place];  Service: Vascular;  Laterality: Left;  . Insertion of dialysis catheter Right 08/05/2014    Procedure: INSERTION OF DIALYSIS CATHETER Right internal Jugular;  Surgeon: Conrad Tennant, MD;  Location: [place];  Service: Vascular;  Laterality: Right;    History   Social History  . Marital Status: Married    Spouse Name: N/A  . Number of Children: N/A  . Years of Education: N/A   Occupational History  . retired     [employer]   Social History Main Topics  . Smoking status: Former Smoker -- 0.50 packs/day for 50 years    Types: Cigarettes    Start date: 06/25/1957      Quit date: 04/04/2014  . Smokeless tobacco: Never Used  . Alcohol Use: No     Comment: quit about 1 month ago, prior to this would drink about 7 oz of liquor per day 10/2012  . Drug Use: No  . Sexual Activity: No   Other Topics Concern  . Not on file   Social History Narrative   Married, retired from [employer], 6 children   Right handed   Caffeine use - none     Filed Vitals:   10/14/14  1414  BP: 100/68  Pulse: 65  Height: 6\' 3"  (1.905 m)  Weight: 159 lb (72.122 kg)  SpO2: 98%    PHYSICAL EXAM General: NAD HEENT: Normal. Neck: No JVD, no thyromegaly. Lungs: Clear to auscultation bilaterally with normal respiratory effort. CV: Nondisplaced PMI.  Regular rate and rhythm, normal S1/S2, no S3/S4, no murmur. No pretibial or periankle edema.  No carotid bruit.   Abdomen: Soft, nontender, no distention.  Neurologic: Alert and oriented x 3.  Psych: Normal affect. Skin: Normal. Musculoskeletal: No gross deformities. Extremities: No clubbing or cyanosis.   ECG: Most recent ECG reviewed.      ASSESSMENT AND PLAN: 1. CAD: Stable ischemic heart disease. Continue aspirin, Coreg, and Lipitor.   2. Malignant HTN: Controlled. No changes.  3. Chronic systolic heart failure: Euvolemic and stable. LVEF improved to 50-55% as noted above.  4. Hyperlipidemia: Lipids on 09/18/14 showed total cholesterol 133, triglycerides 50, HDL 53, LDL 70. Continue current therapy with Lipitor 80 mg and Zetia 10 mg.  5. CKD stage 5: Now on hemodialysis.   Dispo: f/u 6 months.   Kate Sable, M.D., F.A.C.C.

## 2014-10-16 ENCOUNTER — Emergency Department (HOSPITAL_COMMUNITY)
Admission: EM | Admit: 2014-10-16 | Discharge: 2014-10-17 | Disposition: A | Discharge status: Home / Self Care | Payer: Medicare Other | Attending: Emergency Medicine | Admitting: Emergency Medicine

## 2014-10-16 ENCOUNTER — Emergency Department (HOSPITAL_COMMUNITY): Payer: Medicare Other

## 2014-10-16 ENCOUNTER — Encounter (HOSPITAL_COMMUNITY): Payer: Self-pay | Admitting: Emergency Medicine

## 2014-10-16 DIAGNOSIS — E785 Hyperlipidemia, unspecified: Secondary | ICD-10-CM | POA: Diagnosis not present

## 2014-10-16 DIAGNOSIS — I120 Hypertensive chronic kidney disease with stage 5 chronic kidney disease or end stage renal disease: Secondary | ICD-10-CM | POA: Insufficient documentation

## 2014-10-16 DIAGNOSIS — Z87891 Personal history of nicotine dependence: Secondary | ICD-10-CM | POA: Diagnosis not present

## 2014-10-16 DIAGNOSIS — I2510 Atherosclerotic heart disease of native coronary artery without angina pectoris: Secondary | ICD-10-CM | POA: Insufficient documentation

## 2014-10-16 DIAGNOSIS — J441 Chronic obstructive pulmonary disease with (acute) exacerbation: Secondary | ICD-10-CM | POA: Diagnosis not present

## 2014-10-16 DIAGNOSIS — Z9119 Patient's noncompliance with other medical treatment and regimen: Secondary | ICD-10-CM | POA: Insufficient documentation

## 2014-10-16 DIAGNOSIS — Z8673 Personal history of transient ischemic attack (TIA), and cerebral infarction without residual deficits: Secondary | ICD-10-CM | POA: Insufficient documentation

## 2014-10-16 DIAGNOSIS — Z992 Dependence on renal dialysis: Secondary | ICD-10-CM | POA: Diagnosis not present

## 2014-10-16 DIAGNOSIS — I252 Old myocardial infarction: Secondary | ICD-10-CM | POA: Diagnosis not present

## 2014-10-16 DIAGNOSIS — F419 Anxiety disorder, unspecified: Secondary | ICD-10-CM | POA: Diagnosis not present

## 2014-10-16 DIAGNOSIS — N186 End stage renal disease: Secondary | ICD-10-CM | POA: Diagnosis not present

## 2014-10-16 DIAGNOSIS — Z7982 Long term (current) use of aspirin: Secondary | ICD-10-CM | POA: Insufficient documentation

## 2014-10-16 DIAGNOSIS — T82838A Hemorrhage of vascular prosthetic devices, implants and grafts, initial encounter: Secondary | ICD-10-CM | POA: Insufficient documentation

## 2014-10-16 DIAGNOSIS — R531 Weakness: Secondary | ICD-10-CM | POA: Diagnosis not present

## 2014-10-16 DIAGNOSIS — I4891 Unspecified atrial fibrillation: Secondary | ICD-10-CM | POA: Insufficient documentation

## 2014-10-16 DIAGNOSIS — Z9889 Other specified postprocedural states: Secondary | ICD-10-CM | POA: Diagnosis not present

## 2014-10-16 DIAGNOSIS — Z79899 Other long term (current) drug therapy: Secondary | ICD-10-CM | POA: Diagnosis not present

## 2014-10-16 DIAGNOSIS — I5022 Chronic systolic (congestive) heart failure: Secondary | ICD-10-CM | POA: Insufficient documentation

## 2014-10-16 DIAGNOSIS — M1990 Unspecified osteoarthritis, unspecified site: Secondary | ICD-10-CM | POA: Diagnosis not present

## 2014-10-16 DIAGNOSIS — F329 Major depressive disorder, single episode, unspecified: Secondary | ICD-10-CM | POA: Insufficient documentation

## 2014-10-16 DIAGNOSIS — Y832 Surgical operation with anastomosis, bypass or graft as the cause of abnormal reaction of the patient, or of later complication, without mention of misadventure at the time of the procedure: Secondary | ICD-10-CM | POA: Insufficient documentation

## 2014-10-16 LAB — BASIC METABOLIC PANEL
Anion gap: 13 (ref 5–15)
BUN: 34 mg/dL — ABNORMAL HIGH (ref 6–20)
CO2: 27 mmol/L (ref 22–32)
Calcium: 8.8 mg/dL — ABNORMAL LOW (ref 8.9–10.3)
Chloride: 98 mmol/L — ABNORMAL LOW (ref 101–111)
Creatinine, Ser: 6.47 mg/dL — ABNORMAL HIGH (ref 0.61–1.24)
GFR, EST AFRICAN AMERICAN: 9 mL/min — AB (ref >60)
GFR, EST NON AFRICAN AMERICAN: 8 mL/min — AB (ref >60)
GLUCOSE: 86 mg/dL (ref 65–99)
Potassium: 5 mmol/L (ref 3.5–5.1)
Sodium: 138 mmol/L (ref 135–145)

## 2014-10-16 LAB — TYPE AND SCREEN
ABO/RH(D): B POS
ANTIBODY SCREEN: NEGATIVE

## 2014-10-16 LAB — CBC WITH DIFFERENTIAL/PLATELET
Basophils Absolute: 0 10*3/uL (ref 0.0–0.1)
Basophils Relative: 0 % (ref 0–1)
EOS PCT: 2 % (ref 0–5)
Eosinophils Absolute: 0.1 10*3/uL (ref 0.0–0.7)
HCT: 37.8 % — ABNORMAL LOW (ref 39.0–52.0)
Hemoglobin: 12.6 g/dL — ABNORMAL LOW (ref 13.0–17.0)
Lymphocytes Relative: 28 % (ref 12–46)
Lymphs Abs: 1.1 10*3/uL (ref 0.7–4.0)
MCH: 33 pg (ref 26.0–34.0)
MCHC: 33.3 g/dL (ref 30.0–36.0)
MCV: 99 fL (ref 78.0–100.0)
MONOS PCT: 4 % (ref 3–12)
Monocytes Absolute: 0.2 10*3/uL (ref 0.1–1.0)
NEUTROS ABS: 2.7 10*3/uL (ref 1.7–7.7)
Neutrophils Relative %: 66 % (ref 43–77)
Platelets: 128 10*3/uL — ABNORMAL LOW (ref 150–400)
RBC: 3.82 MIL/uL — ABNORMAL LOW (ref 4.22–5.81)
RDW: 14 % (ref 11.5–15.5)
WBC: 4 10*3/uL (ref 4.0–10.5)

## 2014-10-16 MED ORDER — "THROMBI-PAD 3""X3"" EX PADS"
1.0000 | MEDICATED_PAD | Freq: Once | CUTANEOUS | Status: AC
  Administered 2014-10-16: 1 via TOPICAL

## 2014-10-16 MED ORDER — "THROMBI-PAD 3""X3"" EX PADS"
MEDICATED_PAD | CUTANEOUS | Status: AC
  Administered 2014-10-16: 1 via TOPICAL
  Filled 2014-10-16: qty 1

## 2014-10-16 NOTE — ED Notes (Signed)
Pt having bleeding from his dialysis graft site -- First time used graft on Monday -

## 2014-10-16 NOTE — Discharge Instructions (Signed)
Do not remove the dressing for your graft.  Get rechecked immediately (dial 911) if you have any more bleeding from your graft.  Inform your dialysis center that you had bleeding from your fistula.    AV Fistula, Care After Refer to this sheet in the next few weeks. These instructions provide you with information on caring for yourself after your procedure. Your caregiver may also give you more specific instructions. Your treatment has been planned according to current medical practices, but problems sometimes occur. Call your caregiver if you have any problems or questions after your procedure. HOME CARE INSTRUCTIONS   Do not drive a car or take public transportation alone.  Do not drink alcohol.  Only take medicine that has been prescribed by your caregiver.  Do not sign important papers or make important decisions.  Have a responsible person with you.  Ask your caregiver to show you how to check your access at home for a vibration (called a "thrill") or for a sound (called a "bruit" pronounced brew-ee).  Your vein will need time to enlarge and mature so needles can be inserted for dialysis. Follow your caregiver's instructions about what you need to do to make this happen.  Keep dressings clean and dry.  Keep the arm elevated above your heart. Use a pillow.  Rest.  Use the arm as usual for all activities.  Have the stitches or tape closures removed in 10 to 14 days, or as directed by your caregiver.  Do not sleep or lie on the area of the fistula or that arm. This may decrease or stop the blood flow through your fistula.  Do not allow blood pressures to be taken on this arm.  Do not allow blood drawing to be done from the graft.  Do not wear tight clothing around the access site or on the arm.  Avoid lifting heavy objects with the arm that has the fistula.  Do not use creams or lotions over the access site. SEEK MEDICAL CARE IF:   You have a fever.  You have swelling  around the fistula that gets worse, or you have new pain.  You have unusual bleeding at the fistula site or from any other area.  You have pus or other drainage at the fistula site.  You have skin redness or red streaking on the skin around, above, or below the fistula site.  Your access site feels warm.  You have any flu-like symptoms. SEEK IMMEDIATE MEDICAL CARE IF:   You have pain, numbness, or an unusual pale skin on the hand or on the side of your fistula.  You have dizziness or weakness that you have not had before.  You have shortness of breath.  You have chest pain.  Your fistula disconnects or breaks, and there is bleeding that cannot be easily controlled. Call for local emergency medical help. Do not try to drive yourself to the hospital. MAKE SURE YOU  Understand these instructions.  Will watch your condition.  Will get help right away if you are not doing well or get worse. Document Released: 03/07/2005 Document Revised: 07/22/2013 Document Reviewed: 08/25/2010 [facility] Patient Information 2015 [place], [place]. This information is not intended to replace advice given to you by your health care provider. Make sure you discuss any questions you have with your health care provider.

## 2014-10-16 NOTE — ED Notes (Signed)
Left arm bleeding remains controled. Dressing clean dry and intact. Arm elevated on pillows. No concerns at this time.

## 2014-10-16 NOTE — ED Notes (Addendum)
Left arm propped up on 2 pillows. Dressing applied to av graft site remains dry with no bleeding noted. 2l [place] o2 applied as pt sats maintained high 80's low 90's. EDP notifed.

## 2014-10-16 NOTE — ED Notes (Signed)
Thrombi-pad applied to av fistula for bleed, abd pad and kurlex applied to secure. Arm raised above head. EDP in room.

## 2014-10-16 NOTE — ED Notes (Signed)
VSS, av fistula dressing remains clean, dry and intact.

## 2014-10-16 NOTE — ED Notes (Signed)
Ambulated pt per EDP order. Pt unsteady at first, then using walker from home, able to walk in hall 10 ft. Ambulated back to room and to bed steadily, but stated he felt "a little out of breath". sats 97% on room air at that time. Pt swaying in bed as he sat answering questions for nurse. Family concerned that pt "not quite at his" baseline. EDP notified.

## 2014-10-16 NOTE — ED Provider Notes (Signed)
CSN: [id number]   Arrival date & time 10/16/14 1958  History  This chart was scribed for  Quintella Reichert, MD by Altamease Oiler, ED Scribe. This patient was seen in room [facility] and the patient's care was started at 8:16 PM.  Chief Complaint  Patient presents with  . Vascular Access Problem    HPI The history is provided by the patient and the spouse. No language interpreter was used.   [name] is a 69 y.o. male with PMHx of CVA, multiple TIAs, A. Fib on ASA 81, HTN, HLD, cardiomyopathy, CKD on M/W/F dialysis ,CAD, and AAA s/p repair who presents to the Emergency Department complaining of atraumatic bleeding from the dialysis fistula in his LUE with onset around 6:45 PM tonight. He and his wife attempted to apply pressure and a bandage to the site of the bleeding with no significant result. They are unsure whether the bleeding was best described as a squirt or an ooze. The fistula was last accessed yesterday when he had a full dialysis treatment. The fistula was placed [facility] on 10/13/14 and has been used twice. Associated symptoms include generalized weakness, dizziness, and mild SOB. Pt denies pain in the LUE and chest pain. No known anticoagulation besides 81 mg of aspirin daily. No religious aversion to blood transfusions. Nephology: Dr. Eulas Post   Past Medical History  Diagnosis Date  . Hypertension   . COPD (chronic obstructive pulmonary disease)   . Hyperlipidemia   . Cardiomyopathy     EF of 30% per echo in June of 2012; admitted with congestive heart failure; nonobstructive CAD on cath in 08/2010  . History of noncompliance with medical treatment   . Cerebrovascular disease 2011    CVA  in 02/2010-only deficit is decreased vision in  right eye  . Alcohol abuse   . Tobacco abuse     40 pack years  . Abdominal aortic aneurysm     Stent graft repair  . Chronic systolic CHF (congestive heart failure)   . Leg pain   . CKD (chronic kidney  disease) stage 3, GFR 30-59 ml/min     creatinine-1.7 in 08/2010  . CAD (coronary artery disease)   . Myocardial infarction acute 10/01/11  . CKD (chronic kidney disease), stage III 12/06/2012  . Effusion of right knee joint 12/06/2012  . Anemia   . Atrial fibrillation   . Ataxic gait   . Weakness generalized   . Frequent falls   . Forgetfulness   . Stroke 2011    decreased vision of right eye    Most recent 05/23/14 - bleed - has a drag to his left leg  . Depression   . Anxiety     Takes Citalopram  . Arthritis     knees    Past Surgical History  Procedure Laterality Date  . Colonoscopy w/ polypectomy  2006    Dr. Tamala Julian: anal fissure, sessile adenomatous polyp, diverticulosis  . Esophagogastroduodenoscopy  11/15/2010    Procedure: ESOPHAGOGASTRODUODENOSCOPY (EGD);  Surgeon: Dorothyann Peng, MD;  Location: [facility];  Service: Endoscopy;  Laterality: N/A;  with propofol sedation; procedure start @ 0813  . Flexible sigmoidoscopy  11/15/2010    Procedure: FLEXIBLE SIGMOIDOSCOPY;  Surgeon: Dorothyann Peng, MD;  Location: [facility];  Service: Endoscopy;  Laterality: N/A;  with propofol sedation; ended @ 0808  . Polypectomy  12/13/2010    Procedure: POLYPECTOMY;  Surgeon: Dorothyann Peng, MD;  Location: [facility];  Service:  Endoscopy;  Laterality: N/A;  right colon, cecal, transverse, and sigmoid polypectomy  . Cardiac catheterization  10/04/11    Normal left main, 95% pLAD stenosis with ulceration s/p successful BMS placement, 30% segmental plaque beyond this, dLAD after D2 with 50% plaquing, then focal 70% lesion, 80% focal short D2 stenosis, 30% pOM2 lesion, 30-40% mOM3 stenosis, Shephard's crook region of RCA with 50% lesion, mRCA 50-60% lesion and mild dRCA irregularities.   . Coronary stent placement  10/04/11  . Abdominal aortic aneurysm repair w/ endoluminal graft      01/16/2008  . Left heart catheterization with coronary angiogram N/A 10/04/2011    Procedure: LEFT HEART CATHETERIZATION WITH  CORONARY ANGIOGRAM;  Surgeon: Hillary Bow, MD;  Location: [facility] CATH LAB;  Service: Cardiovascular;  Laterality: N/A;  . Av fistula placement Left 07/07/2014    Procedure: Creation of Left Arm ARTERIOVENOUS Fistula;  Surgeon: Rosetta Posner, MD;  Location: [place];  Service: Vascular;  Laterality: Left;  . Insertion of dialysis catheter Right 08/05/2014    Procedure: INSERTION OF DIALYSIS CATHETER Right internal Jugular;  Surgeon: Conrad Ona, MD;  Location: [place];  Service: Vascular;  Laterality: Right;    Family History  Problem Relation Age of Onset  . Colon cancer Neg Hx   . Anesthesia problems Neg Hx   . Hypotension Neg Hx   . Malignant hyperthermia Neg Hx   . Pseudochol deficiency Neg Hx   . Cancer Mother     Gall Bladder  . Heart disease Mother     before age 22  . Hyperlipidemia Mother   . Hypertension Mother   . Asthma Mother   . Cancer Sister     Cervical    History  Substance Use Topics  . Smoking status: Former Smoker -- 0.50 packs/day for 50 years    Types: Cigarettes    Start date: 06/25/1957    Quit date: 04/04/2014  . Smokeless tobacco: Never Used  . Alcohol Use: No     Comment: quit about 1 month ago, prior to this would drink about 7 oz of liquor per day 10/2012     Review of Systems  Respiratory: Positive for shortness of breath.   Cardiovascular: Negative for chest pain.  Musculoskeletal:       Bleeding from dialysis fistula in LUE  Neurological: Positive for dizziness and weakness.  All other systems reviewed and are negative.    Home Medications   Prior to Admission medications   Medication Sig Start Date End Date Taking? Authorizing Provider  amLODipine (NORVASC) 5 MG tablet Take 5 mg by mouth daily. 09/24/14  Yes Historical Provider, MD  aspirin EC 81 MG tablet Take 81 mg by mouth daily.   Yes Historical Provider, MD  atorvastatin (LIPITOR) 80 MG tablet TAKE 1 TABLET (80 MG TOTAL) BY MOUTH DAILY AT 6 PM. 06/30/14  Yes Herminio Commons, MD   carvedilol (COREG) 3.125 MG tablet Take 1 tablet (3.125 mg total) by mouth 2 (two) times daily. 04/17/14  Yes Herminio Commons, MD  citalopram (CELEXA) 10 MG tablet Take 10 mg by mouth daily. 06/24/14  Yes Historical Provider, MD  cloNIDine (CATAPRES) 0.1 MG tablet TAKE 1 TABLET (0.1 MG TOTAL) BY MOUTH 2 (TWO) TIMES DAILY. 09/10/14  Yes Kathyrn Drown, MD  ezetimibe (ZETIA) 10 MG tablet Take 1 tablet (10 mg total) by mouth daily. 04/17/14  Yes Herminio Commons, MD  Multiple Vitamin (MULTIVITAMIN WITH MINERALS) TABS Take 1 tablet by  mouth daily.   Yes Historical Provider, MD  pantoprazole (PROTONIX) 40 MG tablet Take 1 tablet (40 mg total) by mouth daily. 02/19/14  Yes Danie Binder, MD  polyethylene glycol (MIRALAX / GLYCOLAX) packet Take 17 g by mouth daily. 11/29/13  Yes Kathie Dike, MD  VOLTAREN 1 % GEL Apply 4 g topically daily as needed (for knee pain). Bilateral Knee 12/07/13  Yes Historical Provider, MD  feeding supplement, ENSURE COMPLETE, (ENSURE COMPLETE) LIQD Take 237 mLs by mouth daily at 12 noon. Patient not taking: Reported on 10/16/2014 05/26/14   Garvin Fila, MD    Allergies  Review of patient's allergies indicates no known allergies.  Triage Vitals: BP 147/95 mmHg  Pulse 79  Temp(Src) 98.4 F (36.9 C) (Oral)  Resp 18  Ht 6\' 3"  (1.905 m)  Wt 160 lb 15 oz (73 kg)  BMI 20.12 kg/m2  SpO2 97%  Physical Exam  Constitutional: He is oriented to person, place, and time. He appears well-developed and well-nourished. He appears distressed.  HENT:  Head: Normocephalic and atraumatic.  Cardiovascular: Normal rate and regular rhythm.   No murmur heard. Pulmonary/Chest: Effort normal. No respiratory distress.  Decreased air movement bilaterally. Vas-Cath and right anterior chest wall, dressing clean dry and intact.  Abdominal: Soft. There is no tenderness. There is no rebound and no guarding.  Musculoskeletal:  Left upper extremity with palpable thrill. Dressing in place with  mild local pressure. Dressing is clean dry and intact. Patient is well-developed perfused and warm distal to dressing. 2+ radial pulse.  Neurological: He is alert and oriented to person, place, and time.  Skin: Skin is warm and dry.  Psychiatric: He has a normal mood and affect. His behavior is normal.  Nursing note and vitals reviewed.   ED Course  Procedures   DIAGNOSTIC STUDIES: Oxygen Saturation is 97% on RA, normal by my interpretation.    COORDINATION OF CARE: 8:21 PM Discussed treatment plan which includes lab work with pt at bedside and pt agreed to plan.  9:49 PM I re-evaluated the patient and provided an update on the treatment plan.   Labs Review-  Labs Reviewed  BASIC METABOLIC PANEL - Abnormal; Notable for the following:    Chloride 98 (*)    BUN 34 (*)    Creatinine, Ser 6.47 (*)    Calcium 8.8 (*)    GFR calc non Af Amer 8 (*)    GFR calc Af Amer 9 (*)    All other components within normal limits  CBC WITH DIFFERENTIAL/PLATELET - Abnormal; Notable for the following:    RBC 3.82 (*)    Hemoglobin 12.6 (*)    HCT 37.8 (*)    Platelets 128 (*)    All other components within normal limits  TYPE AND SCREEN    Imaging Review Dg Chest Port 1 View  10/16/2014   CLINICAL DATA:  Shortness of breath.  EXAM: PORTABLE CHEST - 1 VIEW  COMPARISON:  08/05/2014  FINDINGS: Heart size and pulmonary vascularity are normal and the lungs are clear. Double-lumen central venous catheter in place, unchanged. No osseous abnormality.  IMPRESSION: No acute abnormalities.   Electronically Signed   By: Lorriane Shire M.D.   On: 10/16/2014 21:05    EKG Interpretation None   MDM   Final diagnoses:  Hemorrhage of arteriovenous graft, initial encounter   Patient here for evaluation of bleeding from fistula, per report patient was significant bleeding at home. Patient with pulsatile bleeding noted by  nursing staff with thrombin dressing applied. Patient is hemostatic on evaluation by  myself. Patient does have some mild symptoms of lightheadedness and generalized weakness. Will check counts and monitor.  Patient observed in the emergency department for several hours. He had no recurrent bleeding and was able to ambulate in the department. He is chronically weak and he was slightly weaker than his baseline on ambulation. Discussed with patient and family home care as well as close return precautions for evidence of recurrent bleeding. Patient is well perfused on repeat evaluation.  I personally performed the services described in this documentation, which was scribed in my presence. The recorded information has been reviewed and is accurate.    Quintella Reichert, MD 10/17/14 0030

## 2014-10-17 NOTE — ED Notes (Signed)
Discharge instructions given, pt demonstrated teach back and verbal understanding. No concerns voiced.  

## 2014-11-11 ENCOUNTER — Ambulatory Visit (INDEPENDENT_AMBULATORY_CARE_PROVIDER_SITE_OTHER): Payer: Medicare Other | Admitting: Family Medicine

## 2014-11-11 ENCOUNTER — Encounter: Payer: Self-pay | Admitting: Family Medicine

## 2014-11-11 VITALS — BP 116/64 | Ht 75.0 in | Wt 162.0 lb

## 2014-11-11 DIAGNOSIS — I10 Essential (primary) hypertension: Secondary | ICD-10-CM | POA: Diagnosis not present

## 2014-11-11 DIAGNOSIS — F0150 Vascular dementia without behavioral disturbance: Secondary | ICD-10-CM | POA: Diagnosis not present

## 2014-11-11 DIAGNOSIS — N185 Chronic kidney disease, stage 5: Secondary | ICD-10-CM

## 2014-11-11 DIAGNOSIS — I119 Hypertensive heart disease without heart failure: Secondary | ICD-10-CM | POA: Diagnosis not present

## 2014-11-11 DIAGNOSIS — R64 Cachexia: Secondary | ICD-10-CM

## 2014-11-11 NOTE — Progress Notes (Signed)
   Subjective:    Patient ID: [name], male    DOB: 09-28-1945, 69 y.o.   MRN: [id number]  Hypertension This is a chronic problem. The current episode started more than 1 year ago. The problem has been gradually improving since onset. The problem is controlled. Pertinent negatives include no chest pain. There are no associated agents to hypertension. There are no known risk factors for coronary artery disease. Treatments tried: carvedilol. The current treatment provides moderate improvement. There are no compliance problems.    Patient has a knot on his right hip area that has been present for about 3-4 weeks now. Patient would like the doctor to take a look at it.  Memory fair- some days better Some bleeding issues with dialysis shunt but not other areas, no bruising issues   Review of Systems  Constitutional: Negative for activity change, appetite change and fatigue.  HENT: Negative for congestion.   Respiratory: Negative for cough.   Cardiovascular: Negative for chest pain.  Gastrointestinal: Negative for abdominal pain.  Endocrine: Negative for polydipsia and polyphagia.  Neurological: Negative for weakness.  Psychiatric/Behavioral: Negative for confusion.       Objective:   Physical Exam  Constitutional: He appears well-nourished. No distress.  Cardiovascular: Normal rate, regular rhythm and normal heart sounds.   No murmur heard. Pulmonary/Chest: Effort normal and breath sounds normal. No respiratory distress.  Musculoskeletal: He exhibits no edema.  Lymphadenopathy:    He has no cervical adenopathy.  Neurological: He is alert.  Psychiatric: His behavior is normal.  Vitals reviewed.   Pt thin Wife with questions on menetal status Follows up with vasc and vei on monday      Assessment & Plan:  Patient is relatively thin I believe this is a result of chronic health issues as well as dialysis. 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

## 2014-11-12 ENCOUNTER — Encounter: Payer: Self-pay | Admitting: Family Medicine

## 2014-11-13 ENCOUNTER — Other Ambulatory Visit: Payer: Self-pay

## 2014-11-14 ENCOUNTER — Encounter: Payer: Self-pay | Admitting: Family

## 2014-11-16 ENCOUNTER — Other Ambulatory Visit: Payer: Self-pay | Admitting: Cardiovascular Disease

## 2014-11-17 ENCOUNTER — Encounter: Payer: Self-pay | Admitting: Family

## 2014-11-17 ENCOUNTER — Ambulatory Visit (INDEPENDENT_AMBULATORY_CARE_PROVIDER_SITE_OTHER): Payer: Medicare Other | Admitting: Family

## 2014-11-17 ENCOUNTER — Ambulatory Visit (HOSPITAL_COMMUNITY)
Admission: RE | Admit: 2014-11-17 | Discharge: 2014-11-17 | Disposition: A | Discharge status: Home / Self Care | Payer: Medicare Other | Source: Ambulatory Visit | Attending: Family | Admitting: Family

## 2014-11-17 VITALS — BP 114/73 | HR 52 | Temp 97.0°F | Resp 14 | Ht 75.0 in | Wt 162.0 lb

## 2014-11-17 DIAGNOSIS — I714 Abdominal aortic aneurysm, without rupture: Secondary | ICD-10-CM | POA: Diagnosis not present

## 2014-11-17 DIAGNOSIS — Z95828 Presence of other vascular implants and grafts: Secondary | ICD-10-CM

## 2014-11-17 DIAGNOSIS — N186 End stage renal disease: Secondary | ICD-10-CM

## 2014-11-17 DIAGNOSIS — Z992 Dependence on renal dialysis: Secondary | ICD-10-CM

## 2014-11-17 DIAGNOSIS — Z48812 Encounter for surgical aftercare following surgery on the circulatory system: Secondary | ICD-10-CM | POA: Insufficient documentation

## 2014-11-17 DIAGNOSIS — I770 Arteriovenous fistula, acquired: Secondary | ICD-10-CM | POA: Diagnosis not present

## 2014-11-17 DIAGNOSIS — Z87891 Personal history of nicotine dependence: Secondary | ICD-10-CM

## 2014-11-17 DIAGNOSIS — I723 Aneurysm of iliac artery: Secondary | ICD-10-CM

## 2014-11-17 NOTE — Progress Notes (Signed)
[facility] [place]  Established EVAR  History of Present Illness  [name] is a 69 y.o. ([birth year]) male patient of Dr. [name] who returns for follow up surveillance of EVAR (01/16/2008 using a Cook Zenith device). He also has a left upper arm AV fistula that was placed 07/07/14 by [name]. Wife states she took pt to the ED at [facility] recently due to bleeding from left upper arm AVF; the AVF has been used since July 2016, wife states the temporary HD catheter on the right side of his chest is scheduled to be removed Sept 1, 2016.   He denies steal symptoms in his left hand.  The patient has no back or abdominal pain. He denies claudication pain in legs, denies non-healing wounds.  He had an MI in July 2013 and completed cardiac rehabilitation. A coronary stent was placed. He reports several strokes and does not know when his last stroke was; manifested as some degree of loss of vision and trouble speaking, denies hemiplegia. He states he quit ETOH use in 2015, states he stopped that for now, documented 28 shots of liquor/week in the past. He reports feeling off balance at times, the reason he uses walker. Noted that carotid Duplex done January, 2014 shows <40% bilateral ICA stenosis.  [name] follows pt for history strokes; wife states pt had a series of micro bleeds in his brain in March of this year.  He takes a daily statin and ASA, no other antiplatelet nor anticoagulant medications.    Pt Diabetic: No Pt smoker: former smoker (3/4 ppd x 50+ yrs, quit December 2015)  Dr. [name] reviewed pt's CT angiogram at pt's February 2015 visit. This showed a stable aneurysm sac. Diameter measurements were 4.1 x 2.8. There was no evidence of endoleak. There had been a slight increase in the size of the right common iliac artery. This had increased from 2.3 up to 2.6 when compared to his study in June of 2010 No evidence of complicating  features on CT scan.Will need to pay attention to the dilated right common iliac artery on future studies.  Dr. [name] scheduled the patient to come back in one year with an abdominal ultrasound   Past Medical History  Diagnosis Date  . Hypertension   . COPD (chronic obstructive pulmonary disease)   . Hyperlipidemia   . Cardiomyopathy     EF of 30% per echo in June of 2012; admitted with congestive heart failure; nonobstructive CAD on cath in 08/2010  . History of noncompliance with medical treatment   . Cerebrovascular disease 2011    CVA  in 02/2010-only deficit is decreased vision in  right eye  . Alcohol abuse   . Tobacco abuse     40 pack years  . Abdominal aortic aneurysm     Stent graft repair  . Chronic systolic CHF (congestive heart failure)   . Leg pain   . CKD (chronic kidney disease) stage 3, GFR 30-59 ml/min     creatinine-1.7 in 08/2010  . CAD (coronary artery disease)   . Myocardial infarction acute 10/01/11  . CKD (chronic kidney disease), stage III 12/06/2012  . Effusion of right knee joint 12/06/2012  . Anemia   . Atrial fibrillation   . Ataxic gait   . Weakness generalized   . Frequent falls   . Forgetfulness   . Stroke 2011    decreased vision of right eye    Most recent 05/23/14 -  bleed - has a drag to his left leg  . Depression   . Anxiety     Takes Citalopram  . Arthritis     knees   Past Surgical History  Procedure Laterality Date  . Colonoscopy w/ polypectomy  2006    Dr. Tamala Julian: anal fissure, sessile adenomatous polyp, diverticulosis  . Esophagogastroduodenoscopy  11/15/2010    Procedure: ESOPHAGOGASTRODUODENOSCOPY (EGD);  Surgeon: Dorothyann Peng, MD;  Location: [facility];  Service: Endoscopy;  Laterality: N/A;  with propofol sedation; procedure start @ 0813  . Flexible sigmoidoscopy  11/15/2010    Procedure: FLEXIBLE SIGMOIDOSCOPY;  Surgeon: Dorothyann Peng, MD;  Location: [facility];  Service: Endoscopy;  Laterality: N/A;  with propofol sedation; ended @  0808  . Polypectomy  12/13/2010    Procedure: POLYPECTOMY;  Surgeon: Dorothyann Peng, MD;  Location: [facility];  Service: Endoscopy;  Laterality: N/A;  right colon, cecal, transverse, and sigmoid polypectomy  . Cardiac catheterization  10/04/11    Normal left main, 95% pLAD stenosis with ulceration s/p successful BMS placement, 30% segmental plaque beyond this, dLAD after D2 with 50% plaquing, then focal 70% lesion, 80% focal short D2 stenosis, 30% pOM2 lesion, 30-40% mOM3 stenosis, Shephard's crook region of RCA with 50% lesion, mRCA 50-60% lesion and mild dRCA irregularities.   . Coronary stent placement  10/04/11  . Abdominal aortic aneurysm repair w/ endoluminal graft      01/16/2008  . Left heart catheterization with coronary angiogram N/A 10/04/2011    Procedure: LEFT HEART CATHETERIZATION WITH CORONARY ANGIOGRAM;  Surgeon: Hillary Bow, MD;  Location: [facility] CATH LAB;  Service: Cardiovascular;  Laterality: N/A;  . Av fistula placement Left 07/07/2014    Procedure: Creation of Left Arm ARTERIOVENOUS Fistula;  Surgeon: Rosetta Posner, MD;  Location: [facility];  Service: Vascular;  Laterality: Left;  . Insertion of dialysis catheter Right 08/05/2014    Procedure: INSERTION OF DIALYSIS CATHETER Right internal Jugular;  Surgeon: Conrad South Mills, MD;  Location: [facility];  Service: Vascular;  Laterality: Right;   Social History Social History  Substance Use Topics  . Smoking status: Former Smoker -- 0.50 packs/day for 50 years    Types: Cigarettes    Start date: 06/25/1957    Quit date: 04/04/2014  . Smokeless tobacco: Never Used  . Alcohol Use: No     Comment: quit about 1 month ago, prior to this would drink about 7 oz of liquor per day 10/2012   Family History Family History  Problem Relation Age of Onset  . Colon cancer Neg Hx   . Anesthesia problems Neg Hx   . Hypotension Neg Hx   . Malignant hyperthermia Neg Hx   . Pseudochol deficiency Neg Hx   . Cancer Mother     Gall Bladder  . Heart disease  Mother     before age 32  . Hyperlipidemia Mother   . Hypertension Mother   . Asthma Mother   . Cancer Sister     Cervical   Current Outpatient Prescriptions on File Prior to Visit  Medication Sig Dispense Refill  . aspirin EC 81 MG tablet Take 81 mg by mouth daily.    Marland Kitchen atorvastatin (LIPITOR) 80 MG tablet TAKE 1 TABLET (80 MG TOTAL) BY MOUTH DAILY AT 6 PM. 90 tablet 3  . carvedilol (COREG) 3.125 MG tablet Take 1 tablet (3.125 mg total) by mouth 2 (two) times daily. 60 tablet 6  . citalopram (CELEXA) 10 MG tablet Take  10 mg by mouth daily.  4  . cloNIDine (CATAPRES) 0.1 MG tablet TAKE 1 TABLET (0.1 MG TOTAL) BY MOUTH 2 (TWO) TIMES DAILY. 60 tablet 5  . Multiple Vitamin (MULTIVITAMIN WITH MINERALS) TABS Take 1 tablet by mouth daily.    . pantoprazole (PROTONIX) 40 MG tablet Take 1 tablet (40 mg total) by mouth daily. 30 tablet 11  . polyethylene glycol (MIRALAX / GLYCOLAX) packet Take 17 g by mouth daily. 14 each 0  . VOLTAREN 1 % GEL Apply 4 g topically daily as needed (for knee pain). Bilateral Knee  1  . ZETIA 10 MG tablet TAKE 1 TABLET (10 MG TOTAL) BY MOUTH DAILY. 30 tablet 6  . feeding supplement, ENSURE COMPLETE, (ENSURE COMPLETE) LIQD Take 237 mLs by mouth daily at 12 noon. (Patient not taking: Reported on 11/17/2014) 30 Bottle 3  . [DISCONTINUED] metoprolol (TOPROL-XL) 50 MG 24 hr tablet Take 50 mg by mouth daily.      . [DISCONTINUED] niacin (NIASPAN) 500 MG CR tablet Take 500 mg by mouth at bedtime.      . [DISCONTINUED] omeprazole (PRILOSEC) 20 MG capsule 1 po every morning 30 capsule 5   No current facility-administered medications on file prior to visit.   No Known Allergies   ROS: See HPI for pertinent positives and negatives.  Physical Examination  Filed Vitals:   11/17/14 1025  BP: 114/73  Pulse: 52  Temp: 97 F (36.1 C)  TempSrc: Oral  Resp: 14  Height: 6\' 3"  (1.905 m)  Weight: 162 lb (73.483 kg)  SpO2: 96%   Body mass index is 20.25 kg/(m^2).  General:  A&O x 3, WD, using walker.  Pulmonary: Sym exp, good air movt, CTAB, no rales, rhonchi, & wheezing.   Cardiac: RRR, Nl S1, S2, no detected murmur. Left arm AVF with palpable thrill and audible bruit, no thinning area noted over AVF, no bleeding, no aneurysmal dilatation.  Vascular: Vessel Right Left  Radial Palpable Palpable  Carotid Audible without bruit Audiblewithout bruit  Aorta Not palpable N/A  Femoral Palpable Palpable  Popliteal Not palpable Not palpable  PT notPalpable Not Palpable  DP notPalpable notPalpable   Gastrointestinal: soft, NTND, -G/R, - HSM, - palpable masses, - CVAT B.  Musculoskeletal: M/S 5/5 in UE's, 5/5 in LE's, Extremities without ischemic changes.  Neurologic: Pain and light touch intact in extremities, Motor exam as listed above.  CN 2-12 intact.              Non-Invasive Vascular Imaging  EVAR Duplex (Date: 11/17/2014) ABDOMINAL AORTA DUPLEX EVALUATION - POST ENDOVASCULAR REPAIR    INDICATION: Evaluation of endovascular abdominal repair of aortic aneurysm    PREVIOUS INTERVENTION(S): Endovascular repair of abdominal aortic aneurysm 01/16/2008    DUPLEX EXAM:      DIAMETER AP (cm) DIAMETER TRANSVERSE (cm) VELOCITIES (cm/sec)  Aorta 3.2 3.5 53  Right Common Iliac 1.1 .97 120  Left Common Iliac 1.2 .97 65    Comparison Study       Date DIAMETER AP (cm) DIAMETER TRANSVERSE (cm)  05/13/2014 3.7 3.6     ADDITIONAL FINDINGS:     IMPRESSION: 1. Patent endovascular aortic repair 2. Sac size measures 3.2 3.5 cms. 3. Iliac arteries not well visualized due to bowel gas.        05/13/14 EVAR Duplex:     DIAMETER AP (cm) DIAMETER TRANSVERSE (cm) VELOCITIES (cm/sec)  Aorta 3.79 3.64 27  Right Common Iliac 1.1 1.5 nv  Left Common Iliac 1.2 1.4  121    Comparison Study       Date DIAMETER AP (cm) DIAMETER TRANSVERSE (cm)  04/05/2013 3.77 3.75                CTA Abd/Pelvis Duplex (Date: February 2015) IMPRESSION: Aortic stent graft is stable. Aneurysm sac diameter has diminished from 4.5 x 2.8 cm to 4.1 x 2.8 cm. No evidence of endoleak.  Right common iliac artery stent graft has dilated canal from 2.3 cm to 2.6 cm in diameter. No evidence of endoleak. There is no significant narrowing at the origin of the dominant right renal artery and the right kidney has become severely atrophic, measuring 7 cm in length, while measuring 10 cm in length on the prior study. Left superficial femoral artery is now occluded, a new finding.   Medical Decision Making  [name] is a 69 y.o. male who presents s/p EVAR (Date: 01/16/08).  Pt is asymptomatic with decrease sac size, no endo leak. Iliac arteries not well visualized due to bowel gas today, but in February 2016 right CIA was 1.5 cm and left CIA was 1.4 cm. Left UE AVF seems to be working well for HD. Discussed with Dr. [name]. Face to face time with patient was 25 minutes. Over 50% of this time was spent on counseling and coordination of care.   I discussed with the patient the importance of surveillance of the endograft.  The next endograft duplex will be scheduled for 6 months.  The patient will follow up with [place] in 6 months with these studies.  I emphasized the importance of maximal medical management including strict control of blood pressure, blood glucose, and lipid levels, antiplatelet agents, obtaining regular exercise, and cessation of smoking.   Thank you for allowing [place] to participate in this patient's care.  Clemon Chambers, RN, MSN, FNP-C [facility] Office: [facility] Physician: [name]  11/17/2014, 10:30 AM

## 2014-11-19 ENCOUNTER — Other Ambulatory Visit (HOSPITAL_COMMUNITY): Payer: Self-pay | Admitting: *Deleted

## 2014-11-20 ENCOUNTER — Ambulatory Visit (HOSPITAL_COMMUNITY)
Admission: RE | Admit: 2014-11-20 | Discharge: 2014-11-20 | Disposition: A | Discharge status: Home / Self Care | Payer: Medicare Other | Source: Ambulatory Visit | Attending: Vascular Surgery | Admitting: Vascular Surgery

## 2014-11-20 DIAGNOSIS — Z452 Encounter for adjustment and management of vascular access device: Secondary | ICD-10-CM | POA: Insufficient documentation

## 2014-11-20 DIAGNOSIS — N186 End stage renal disease: Secondary | ICD-10-CM | POA: Diagnosis not present

## 2014-11-20 MED ORDER — LIDOCAINE HCL (PF) 2 % IJ SOLN
INTRAMUSCULAR | Status: AC
  Filled 2014-11-20: qty 10

## 2014-11-20 NOTE — Progress Notes (Signed)
H&P    CC:  Catheter removal   HPI:  This is a 69 y.o. male here for diatek catheter removal.  He had a left brachiocephalic AVF placed by Dr. [name] in April 2016, which is functioning well and has been used several times.  He does have a diatek catheter that was placed in May 2016 by Dr. [name], which he is here to have removed.  He is not on any blood thinners. He denies any steal symptoms in his left hand.  Past Medical History  Diagnosis Date  . Hypertension   . COPD (chronic obstructive pulmonary disease)   . Hyperlipidemia   . Cardiomyopathy     EF of 30% per echo in June of 2012; admitted with congestive heart failure; nonobstructive CAD on cath in 08/2010  . History of noncompliance with medical treatment   . Cerebrovascular disease 2011    CVA  in 02/2010-only deficit is decreased vision in  right eye  . Alcohol abuse   . Tobacco abuse     40 pack years  . Abdominal aortic aneurysm     Stent graft repair  . Chronic systolic CHF (congestive heart failure)   . Leg pain   . CKD (chronic kidney disease) stage 3, GFR 30-59 ml/min     creatinine-1.7 in 08/2010  . CAD (coronary artery disease)   . Myocardial infarction acute 10/01/11  . CKD (chronic kidney disease), stage III 12/06/2012  . Effusion of right knee joint 12/06/2012  . Anemia   . Atrial fibrillation   . Ataxic gait   . Weakness generalized   . Frequent falls   . Forgetfulness   . Stroke 2011    decreased vision of right eye    Most recent 05/23/14 - bleed - has a drag to his left leg  . Depression   . Anxiety     Takes Citalopram  . Arthritis     knees    FH:  Non-Contributory  Social History   Social History  . Marital Status: Married    Spouse Name: N/A  . Number of Children: N/A  . Years of Education: N/A   Occupational History  . retired     [employer]   Social History Main Topics  . Smoking status: Former Smoker -- 0.50 packs/day for 50 years    Types: Cigarettes    Start date:  06/25/1957    Quit date: 04/04/2014  . Smokeless tobacco: Never Used  . Alcohol Use: No     Comment: quit about 1 month ago, prior to this would drink about 7 oz of liquor per day 10/2012  . Drug Use: No  . Sexual Activity: No   Other Topics Concern  . Not on file   Social History Narrative   Married, retired from [employer], 6 children   Right handed   Caffeine use - none    No Known Allergies  Current Outpatient Prescriptions  Medication Sig Dispense Refill  . aspirin EC 81 MG tablet Take 81 mg by mouth daily.    Marland Kitchen atorvastatin (LIPITOR) 80 MG tablet TAKE 1 TABLET (80 MG TOTAL) BY MOUTH DAILY AT 6 PM. 90 tablet 3  . carvedilol (COREG) 3.125 MG tablet Take 1 tablet (3.125 mg total) by mouth 2 (two) times daily. 60 tablet 6  . citalopram (CELEXA) 10 MG tablet Take 10 mg by mouth daily.  4  . cloNIDine (CATAPRES) 0.1 MG tablet TAKE 1 TABLET (0.1 MG TOTAL) BY MOUTH  2 (TWO) TIMES DAILY. 60 tablet 5  . feeding supplement, ENSURE COMPLETE, (ENSURE COMPLETE) LIQD Take 237 mLs by mouth daily at 12 noon. (Patient not taking: Reported on 11/17/2014) 30 Bottle 3  . Multiple Vitamin (MULTIVITAMIN WITH MINERALS) TABS Take 1 tablet by mouth daily.    . pantoprazole (PROTONIX) 40 MG tablet Take 1 tablet (40 mg total) by mouth daily. 30 tablet 11  . polyethylene glycol (MIRALAX / GLYCOLAX) packet Take 17 g by mouth daily. 14 each 0  . VOLTAREN 1 % GEL Apply 4 g topically daily as needed (for knee pain). Bilateral Knee  1  . ZETIA 10 MG tablet TAKE 1 TABLET (10 MG TOTAL) BY MOUTH DAILY. 30 tablet 6  . [DISCONTINUED] metoprolol (TOPROL-XL) 50 MG 24 hr tablet Take 50 mg by mouth daily.      . [DISCONTINUED] niacin (NIASPAN) 500 MG CR tablet Take 500 mg by mouth at bedtime.      . [DISCONTINUED] omeprazole (PRILOSEC) 20 MG capsule 1 po every morning 30 capsule 5   No current facility-administered medications for this encounter.    ROS:  See HPI  PHYSICAL EXAM  There were no vitals filed for  this visit.  Gen:  Well developed well nourished HEENT:  normocephalic Neck:  Right IJ catheter in place Heart:  regular Lungs:  Non-labored Extremities:  Left arm AVF with +thrill and bruit Skin:  No obvious rashes Neuro:  In tact  Lab/X-ray:  Impression: This is a 69 y.o. male here for diatek catheter removal  Plan:  Removal of right diatek catheter  [name], [name] [facility] [phone] 11/20/2014 11:05 AM

## 2014-11-20 NOTE — Progress Notes (Signed)
  Catheter Removal Procedure Note    Diagnosis: ESRD  Plan:  Remove right diatek catheter  Consent signed:  Yes.   Time out completed:  Yes.   Coumadin:  No. PT/INR (if applicable):   Other labs:  Procedure: 1.  Sterile prepping and draping over catheter area 2. 6 ml 2% lidocaine plain instilled at removal site. 3.  right catheter removed in its entirety with cuff in tact. 4.  Complications:  none 5. Tip of catheter sent for culture:  No.   Patient tolerated procedure well:  Yes.    Manual pressure held 35 minutes with continuous skin edge ooze-nylon stitch x 2 placed Instructions given to the pt regarding wound care and bleeding.  Other:  Leontine Locket, PA-C 11/20/2014 11:08 AM

## 2014-11-29 ENCOUNTER — Encounter (HOSPITAL_COMMUNITY): Payer: Self-pay | Admitting: Emergency Medicine

## 2014-11-29 ENCOUNTER — Emergency Department (HOSPITAL_COMMUNITY)
Admission: EM | Admit: 2014-11-29 | Discharge: 2014-11-29 | Disposition: A | Discharge status: Home / Self Care | Payer: Medicare Other | Attending: Emergency Medicine | Admitting: Emergency Medicine

## 2014-11-29 DIAGNOSIS — Z7982 Long term (current) use of aspirin: Secondary | ICD-10-CM | POA: Diagnosis not present

## 2014-11-29 DIAGNOSIS — N183 Chronic kidney disease, stage 3 (moderate): Secondary | ICD-10-CM | POA: Insufficient documentation

## 2014-11-29 DIAGNOSIS — Z862 Personal history of diseases of the blood and blood-forming organs and certain disorders involving the immune mechanism: Secondary | ICD-10-CM | POA: Insufficient documentation

## 2014-11-29 DIAGNOSIS — Z79899 Other long term (current) drug therapy: Secondary | ICD-10-CM | POA: Insufficient documentation

## 2014-11-29 DIAGNOSIS — Z9119 Patient's noncompliance with other medical treatment and regimen: Secondary | ICD-10-CM | POA: Insufficient documentation

## 2014-11-29 DIAGNOSIS — Z87891 Personal history of nicotine dependence: Secondary | ICD-10-CM | POA: Diagnosis not present

## 2014-11-29 DIAGNOSIS — E785 Hyperlipidemia, unspecified: Secondary | ICD-10-CM | POA: Diagnosis not present

## 2014-11-29 DIAGNOSIS — Z4802 Encounter for removal of sutures: Secondary | ICD-10-CM | POA: Diagnosis present

## 2014-11-29 DIAGNOSIS — I129 Hypertensive chronic kidney disease with stage 1 through stage 4 chronic kidney disease, or unspecified chronic kidney disease: Secondary | ICD-10-CM | POA: Insufficient documentation

## 2014-11-29 DIAGNOSIS — M1990 Unspecified osteoarthritis, unspecified site: Secondary | ICD-10-CM | POA: Insufficient documentation

## 2014-11-29 DIAGNOSIS — I2510 Atherosclerotic heart disease of native coronary artery without angina pectoris: Secondary | ICD-10-CM | POA: Diagnosis not present

## 2014-11-29 DIAGNOSIS — I5022 Chronic systolic (congestive) heart failure: Secondary | ICD-10-CM | POA: Diagnosis not present

## 2014-11-29 DIAGNOSIS — F329 Major depressive disorder, single episode, unspecified: Secondary | ICD-10-CM | POA: Insufficient documentation

## 2014-11-29 DIAGNOSIS — I252 Old myocardial infarction: Secondary | ICD-10-CM | POA: Diagnosis not present

## 2014-11-29 DIAGNOSIS — I4891 Unspecified atrial fibrillation: Secondary | ICD-10-CM | POA: Insufficient documentation

## 2014-11-29 DIAGNOSIS — J449 Chronic obstructive pulmonary disease, unspecified: Secondary | ICD-10-CM | POA: Diagnosis not present

## 2014-11-29 DIAGNOSIS — Z8673 Personal history of transient ischemic attack (TIA), and cerebral infarction without residual deficits: Secondary | ICD-10-CM | POA: Diagnosis not present

## 2014-11-29 DIAGNOSIS — F419 Anxiety disorder, unspecified: Secondary | ICD-10-CM | POA: Diagnosis not present

## 2014-11-29 NOTE — Discharge Instructions (Signed)

## 2014-11-29 NOTE — ED Provider Notes (Signed)
CSN: [id number]     Arrival date & time 11/29/14  1017 History   First MD Initiated Contact with Patient 11/29/14 1034     Chief Complaint  Patient presents with  . Suture / Staple Removal     (Consider location/radiation/quality/duration/timing/severity/associated sxs/prior Treatment) Patient is a 69 y.o. male presenting with suture removal. The history is provided by the patient. No language interpreter was used.  Suture / Staple Removal Pertinent negatives include no chills or fever.   [name] is a 73-year-old male who presents for suture removal from the right collarbone. He states that he had a Port-A-Cath removed 9 days ago. He denies any fever, chills, redness around the site of the sutures or drainage.  Past Medical History  Diagnosis Date  . Hypertension   . COPD (chronic obstructive pulmonary disease)   . Hyperlipidemia   . Cardiomyopathy     EF of 30% per echo in June of 2012; admitted with congestive heart failure; nonobstructive CAD on cath in 08/2010  . History of noncompliance with medical treatment   . Cerebrovascular disease 2011    CVA  in 02/2010-only deficit is decreased vision in  right eye  . Alcohol abuse   . Tobacco abuse     40 pack years  . Abdominal aortic aneurysm     Stent graft repair  . Chronic systolic CHF (congestive heart failure)   . Leg pain   . CKD (chronic kidney disease) stage 3, GFR 30-59 ml/min     creatinine-1.7 in 08/2010  . CAD (coronary artery disease)   . Myocardial infarction acute 10/01/11  . CKD (chronic kidney disease), stage III 12/06/2012  . Effusion of right knee joint 12/06/2012  . Anemia   . Atrial fibrillation   . Ataxic gait   . Weakness generalized   . Frequent falls   . Forgetfulness   . Stroke 2011    decreased vision of right eye    Most recent 05/23/14 - bleed - has a drag to his left leg  . Depression   . Anxiety     Takes Citalopram  . Arthritis     knees   Past Surgical History  Procedure Laterality Date   . Colonoscopy w/ polypectomy  2006    Dr. Tamala Julian: anal fissure, sessile adenomatous polyp, diverticulosis  . Esophagogastroduodenoscopy  11/15/2010    Procedure: ESOPHAGOGASTRODUODENOSCOPY (EGD);  Surgeon: Dorothyann Peng, MD;  Location: [facility];  Service: Endoscopy;  Laterality: N/A;  with propofol sedation; procedure start @ 0813  . Flexible sigmoidoscopy  11/15/2010    Procedure: FLEXIBLE SIGMOIDOSCOPY;  Surgeon: Dorothyann Peng, MD;  Location: [facility];  Service: Endoscopy;  Laterality: N/A;  with propofol sedation; ended @ 0808  . Polypectomy  12/13/2010    Procedure: POLYPECTOMY;  Surgeon: Dorothyann Peng, MD;  Location: [facility];  Service: Endoscopy;  Laterality: N/A;  right colon, cecal, transverse, and sigmoid polypectomy  . Cardiac catheterization  10/04/11    Normal left main, 95% pLAD stenosis with ulceration s/p successful BMS placement, 30% segmental plaque beyond this, dLAD after D2 with 50% plaquing, then focal 70% lesion, 80% focal short D2 stenosis, 30% pOM2 lesion, 30-40% mOM3 stenosis, Shephard's crook region of RCA with 50% lesion, mRCA 50-60% lesion and mild dRCA irregularities.   . Coronary stent placement  10/04/11  . Abdominal aortic aneurysm repair w/ endoluminal graft      01/16/2008  . Left heart catheterization with coronary angiogram N/A 10/04/2011  Procedure: LEFT HEART CATHETERIZATION WITH CORONARY ANGIOGRAM;  Surgeon: Hillary Bow, MD;  Location: [facility] CATH LAB;  Service: Cardiovascular;  Laterality: N/A;  . Av fistula placement Left 07/07/2014    Procedure: Creation of Left Arm ARTERIOVENOUS Fistula;  Surgeon: Rosetta Posner, MD;  Location: [place];  Service: Vascular;  Laterality: Left;  . Insertion of dialysis catheter Right 08/05/2014    Procedure: INSERTION OF DIALYSIS CATHETER Right internal Jugular;  Surgeon: Conrad Olmos Park, MD;  Location: [place];  Service: Vascular;  Laterality: Right;  . Port-a-cath removal     Family History  Problem Relation Age of Onset  . Colon  cancer Neg Hx   . Anesthesia problems Neg Hx   . Hypotension Neg Hx   . Malignant hyperthermia Neg Hx   . Pseudochol deficiency Neg Hx   . Cancer Mother     Gall Bladder  . Heart disease Mother     before age 110  . Hyperlipidemia Mother   . Hypertension Mother   . Asthma Mother   . Cancer Sister     Cervical   Social History  Substance Use Topics  . Smoking status: Former Smoker -- 0.50 packs/day for 50 years    Types: Cigarettes    Start date: 06/25/1957    Quit date: 04/04/2014  . Smokeless tobacco: Never Used  . Alcohol Use: No    Review of Systems  Constitutional: Negative for fever and chills.  Skin: Negative for wound.       Suture removal      Allergies  Review of patient's allergies indicates no known allergies.  Home Medications   Prior to Admission medications   Medication Sig Start Date End Date Taking? Authorizing Provider  aspirin EC 81 MG tablet Take 81 mg by mouth daily.    Historical Provider, MD  atorvastatin (LIPITOR) 80 MG tablet TAKE 1 TABLET (80 MG TOTAL) BY MOUTH DAILY AT 6 PM. 06/30/14   Herminio Commons, MD  carvedilol (COREG) 3.125 MG tablet Take 1 tablet (3.125 mg total) by mouth 2 (two) times daily. 04/17/14   Herminio Commons, MD  citalopram (CELEXA) 10 MG tablet Take 10 mg by mouth daily. 06/24/14   Historical Provider, MD  cloNIDine (CATAPRES) 0.1 MG tablet TAKE 1 TABLET (0.1 MG TOTAL) BY MOUTH 2 (TWO) TIMES DAILY. 09/10/14   Kathyrn Drown, MD  feeding supplement, ENSURE COMPLETE, (ENSURE COMPLETE) LIQD Take 237 mLs by mouth daily at 12 noon. Patient not taking: Reported on 11/17/2014 05/26/14   Garvin Fila, MD  Multiple Vitamin (MULTIVITAMIN WITH MINERALS) TABS Take 1 tablet by mouth daily.    Historical Provider, MD  pantoprazole (PROTONIX) 40 MG tablet Take 1 tablet (40 mg total) by mouth daily. 02/19/14   Danie Binder, MD  polyethylene glycol (MIRALAX / GLYCOLAX) packet Take 17 g by mouth daily. 11/29/13   Kathie Dike, MD   VOLTAREN 1 % GEL Apply 4 g topically daily as needed (for knee pain). Bilateral Knee 12/07/13   Historical Provider, MD  ZETIA 10 MG tablet TAKE 1 TABLET (10 MG TOTAL) BY MOUTH DAILY. 11/17/14   Herminio Commons, MD   BP 131/78 mmHg  Pulse 69  Temp(Src) 98.6 F (37 C) (Oral)  Resp 16  Ht 6\' 3"  (1.905 m)  Wt 162 lb (73.483 kg)  BMI 20.25 kg/m2  SpO2 98% Physical Exam  Constitutional: He is oriented to person, place, and time. He appears well-developed and well-nourished.  HENT:  Head: Normocephalic.  Eyes: Conjunctivae are normal.  Neck: Neck supple.  Cardiovascular: Normal rate.   Pulmonary/Chest: Effort normal. No respiratory distress.  Musculoskeletal: Normal range of motion.  Neurological: He is alert and oriented to person, place, and time.  Skin: Skin is warm and dry.  Port-A-Cath in place with no surrounding erythema or drainage.    ED Course  Procedures (including critical care time) Labs Review Labs Reviewed - No data to display SUTURE REMOVAL Performed by: Ottie Glazier Consent: Verbal consent obtained. Patient identity confirmed: provided demographic data Time out: Immediately prior to procedure a "time out" was called to verify the correct patient, procedure, equipment, support staff and site/side marked as required. Location details: Right clavicle over Port-A-Cath  Wound Appearance: clean Sutures/Staples Removed: 2 Facility: sutures placed by vascular surgery  Patient tolerance: Patient tolerated the procedure well with no immediate complications.    Imaging Review No results found. I have personally reviewed and evaluated these images and lab results as part of my medical decision-making.   EKG Interpretation None      MDM   Final diagnoses:  Visit for suture removal   Patient had 2 sutures placed by vascular surgery 9 days ago. He is here for suture removal. No signs of infection. He can follow up as needed with his physician. Patient  verbally agrees the plan.    Ottie Glazier, PA-C 11/29/14 [place], MD 12/01/14 [phone]

## 2014-11-29 NOTE — ED Notes (Signed)
Patient here to have sutures removed from right collarbone. Patient had port-a-cath removed on 11/20/2014. Patient denies any signs of infection (drainage, fevers, pain, etc).

## 2014-12-17 ENCOUNTER — Other Ambulatory Visit: Payer: Self-pay | Admitting: Family Medicine

## 2014-12-17 NOTE — Telephone Encounter (Signed)
May have 3 refills 

## 2014-12-18 ENCOUNTER — Telehealth: Payer: Self-pay | Admitting: Family Medicine

## 2014-12-18 ENCOUNTER — Ambulatory Visit (INDEPENDENT_AMBULATORY_CARE_PROVIDER_SITE_OTHER): Payer: Medicare Other | Admitting: Family Medicine

## 2014-12-18 ENCOUNTER — Encounter: Payer: Self-pay | Admitting: Family Medicine

## 2014-12-18 VITALS — BP 132/80 | Temp 98.7°F | Ht 75.0 in | Wt 160.4 lb

## 2014-12-18 DIAGNOSIS — K859 Acute pancreatitis, unspecified: Secondary | ICD-10-CM

## 2014-12-18 DIAGNOSIS — R109 Unspecified abdominal pain: Secondary | ICD-10-CM | POA: Diagnosis not present

## 2014-12-18 LAB — HEPATIC FUNCTION PANEL
ALBUMIN: 4.7 g/dL (ref 3.5–4.8)
ALT: 9 IU/L (ref 0–44)
AST: 18 IU/L (ref 0–40)
Alkaline Phosphatase: 109 IU/L (ref 39–117)
Bilirubin Total: 0.9 mg/dL (ref 0.0–1.2)
Bilirubin, Direct: 0.21 mg/dL (ref 0.00–0.40)
TOTAL PROTEIN: 8.3 g/dL (ref 6.0–8.5)

## 2014-12-18 LAB — BASIC METABOLIC PANEL
BUN/Creatinine Ratio: 5 — ABNORMAL LOW (ref 10–22)
BUN: 26 mg/dL (ref 8–27)
CALCIUM: 9.9 mg/dL (ref 8.6–10.2)
CO2: 32 mmol/L — AB (ref 18–29)
CREATININE: 5.01 mg/dL — AB (ref 0.76–1.27)
Chloride: 98 mmol/L (ref 97–108)
GFR calc Af Amer: 13 mL/min/{1.73_m2} — ABNORMAL LOW (ref >59)
GFR, EST NON AFRICAN AMERICAN: 11 mL/min/{1.73_m2} — AB (ref >59)
Glucose: 96 mg/dL (ref 65–99)
POTASSIUM: 3.7 mmol/L (ref 3.5–5.2)
Sodium: 142 mmol/L (ref 134–144)

## 2014-12-18 LAB — AMYLASE: Amylase: 296 U/L — ABNORMAL HIGH (ref 31–124)

## 2014-12-18 LAB — LIPASE: LIPASE: 98 U/L — AB (ref 0–59)

## 2014-12-18 NOTE — Progress Notes (Signed)
   Subjective:    Patient ID: [name], male    DOB: 09/19/1945, 69 y.o.   MRN: [id number]  Abdominal Pain This is a new problem. The current episode started in the past 7 days. The problem occurs constantly. The pain is moderate. The quality of the pain is aching and cramping. Pertinent negatives include no diarrhea, dysuria, fever, melena, nausea or vomiting. Nothing aggravates the pain. He has tried nothing for the symptoms.  Groin Pain This is a new problem. The current episode started in the past 7 days. Associated symptoms include abdominal pain. Pertinent negatives include no diarrhea, dysuria, fever, nausea or vomiting. The symptoms are aggravated by tactile pressure (Bathing). He has tried nothing for the symptoms.   Patient states no concerns this visit.  Caregiver states patient has been off balance and decreased appetite in the past 7 days.   Review of Systems  Constitutional: Negative for fever.  Gastrointestinal: Positive for abdominal pain. Negative for nausea, vomiting, diarrhea and melena.  Genitourinary: Negative for dysuria.       Objective:   Physical Exam  Constitutional: He appears well-nourished. No distress.  Cardiovascular: Normal rate, regular rhythm and normal heart sounds.   No murmur heard. Pulmonary/Chest: Effort normal and breath sounds normal. No respiratory distress.  Abdominal: Soft. He exhibits no distension and no mass. There is no tenderness. There is no rebound and no guarding.  Musculoskeletal: He exhibits no edema.  Lymphadenopathy:    He has no cervical adenopathy.  Neurological: He is alert.  Psychiatric: His behavior is normal.  Vitals reviewed.         Assessment & Plan:  Abdominal pain-I doubt that this is a vascular issue I don't think it's Dir. Of the aneurysm I do not feel that it is an abscess. He may end up needing to have a CAT scan but first regarding do some lab work to rule out other entities. I did not choose to add  any medications on today's visit await first to see the findings of the lab work.

## 2014-12-18 NOTE — Telephone Encounter (Signed)
I spoke with the patient's wife. I informed her that it appears he has pancreatitis. Nurse's-please do the following: #1-have the patient do lipase, amylase, CBC stat on Friday morning at the lab on [address] #2-set up patient for Tuesday for ultrasound of abdomen focus on epigastric region pancreas reason pancreatitis #3-feel free to call me if need be later in the day regarding results otherwise show results to Dr. Richardson Landry as long as numbers are steady and not significantly rising the patient can be treated as an outpatient with bland diet #4-if numbers are steady repeat blood work Monday morning may be advisable thank you

## 2014-12-19 LAB — AMYLASE: Amylase: 302 U/L — ABNORMAL HIGH (ref 31–124)

## 2014-12-19 LAB — CBC WITH DIFFERENTIAL/PLATELET
BASOS ABS: 0 10*3/uL (ref 0.0–0.2)
Basos: 0 %
EOS (ABSOLUTE): 0.3 10*3/uL (ref 0.0–0.4)
EOS: 4 %
HEMATOCRIT: 29.6 % — AB (ref 37.5–51.0)
Hemoglobin: 10.4 g/dL — ABNORMAL LOW (ref 12.6–17.7)
LYMPHS ABS: 2 10*3/uL (ref 0.7–3.1)
LYMPHS: 30 %
MCH: 32.3 pg (ref 26.6–33.0)
MCHC: 35.1 g/dL (ref 31.5–35.7)
MCV: 92 fL (ref 79–97)
MONOCYTES: 13 %
Monocytes Absolute: 0.9 10*3/uL (ref 0.1–0.9)
Neutrophils Absolute: 3.4 10*3/uL (ref 1.4–7.0)
Neutrophils: 53 %
Platelets: 194 10*3/uL (ref 150–379)
RBC: 3.22 x10E6/uL — AB (ref 4.14–5.80)
RDW: 14.3 % (ref 12.3–15.4)
WBC: 6.5 10*3/uL (ref 3.4–10.8)

## 2014-12-19 LAB — LIPASE: Lipase: 82 U/L — ABNORMAL HIGH (ref 0–59)

## 2014-12-19 NOTE — Addendum Note (Signed)
Addended by: Dairl Ponder on: 12/19/2014 10:14 AM   Modules accepted: Orders

## 2014-12-19 NOTE — Telephone Encounter (Signed)
Ultrasound scheduled for 12/23/14 at 9:30 be at [facility] 9:15-Nothing to eat or drink after midnight.

## 2014-12-19 NOTE — Telephone Encounter (Addendum)
Discussed with Dr Richardson Landry- continue current treatment and repeat cbc, lipase, amylase and liver on Monday am- ER this weekend if worse. Discussed with wife- wife verbalized understanding.  Wife advised of appt for abdominal ultrasound Tuesday(10/4) at [facility].  Blood work ordered stat in [facility] for Monday(10/3)

## 2014-12-19 NOTE — Addendum Note (Signed)
Addended by: Dairl Ponder on: 12/19/2014 03:48 PM   Modules accepted: Orders

## 2014-12-19 NOTE — Addendum Note (Signed)
Addended by: Dairl Ponder on: 12/19/2014 04:21 PM   Modules accepted: Orders

## 2014-12-22 LAB — CBC WITH DIFFERENTIAL/PLATELET
BASOS ABS: 0 10*3/uL (ref 0.0–0.2)
Basos: 1 %
EOS (ABSOLUTE): 0.2 10*3/uL (ref 0.0–0.4)
Eos: 5 %
HEMATOCRIT: 31 % — AB (ref 37.5–51.0)
HEMOGLOBIN: 11 g/dL — AB (ref 12.6–17.7)
LYMPHS: 35 %
Lymphocytes Absolute: 1.8 10*3/uL (ref 0.7–3.1)
MCH: 32.5 pg (ref 26.6–33.0)
MCHC: 35.5 g/dL (ref 31.5–35.7)
MCV: 92 fL (ref 79–97)
Monocytes Absolute: 0.7 10*3/uL (ref 0.1–0.9)
Monocytes: 13 %
NEUTROS ABS: 2.4 10*3/uL (ref 1.4–7.0)
NEUTROS PCT: 46 %
Platelets: 234 10*3/uL (ref 150–379)
RBC: 3.38 x10E6/uL — AB (ref 4.14–5.80)
RDW: 14.1 % (ref 12.3–15.4)
WBC: 5.2 10*3/uL (ref 3.4–10.8)

## 2014-12-22 LAB — HEPATIC FUNCTION PANEL
ALBUMIN: 4.4 g/dL (ref 3.5–4.8)
ALT: 8 IU/L (ref 0–44)
AST: 15 IU/L (ref 0–40)
Alkaline Phosphatase: 110 IU/L (ref 39–117)
BILIRUBIN TOTAL: 0.7 mg/dL (ref 0.0–1.2)
Bilirubin, Direct: 0.17 mg/dL (ref 0.00–0.40)
Total Protein: 7.7 g/dL (ref 6.0–8.5)

## 2014-12-22 LAB — LIPASE: Lipase: 99 U/L — ABNORMAL HIGH (ref 0–59)

## 2014-12-22 LAB — AMYLASE: Amylase: 254 U/L — ABNORMAL HIGH (ref 31–124)

## 2014-12-22 NOTE — Telephone Encounter (Signed)
[facility]

## 2014-12-22 NOTE — Telephone Encounter (Signed)
Labs improving, repeat lipase/amylase/cbc on thur do u/s on tues as planned call if worse or go to er/ follow up ov next week

## 2014-12-23 ENCOUNTER — Ambulatory Visit (HOSPITAL_COMMUNITY)
Admission: RE | Admit: 2014-12-23 | Discharge: 2014-12-23 | Disposition: A | Discharge status: Home / Self Care | Payer: Medicare Other | Source: Ambulatory Visit | Attending: Family Medicine | Admitting: Family Medicine

## 2014-12-23 ENCOUNTER — Other Ambulatory Visit: Payer: Self-pay | Admitting: *Deleted

## 2014-12-23 DIAGNOSIS — R109 Unspecified abdominal pain: Secondary | ICD-10-CM

## 2014-12-23 DIAGNOSIS — N261 Atrophy of kidney (terminal): Secondary | ICD-10-CM | POA: Diagnosis not present

## 2014-12-23 DIAGNOSIS — I714 Abdominal aortic aneurysm, without rupture: Secondary | ICD-10-CM | POA: Diagnosis not present

## 2014-12-23 DIAGNOSIS — R93422 Abnormal radiologic findings on diagnostic imaging of left kidney: Secondary | ICD-10-CM | POA: Diagnosis not present

## 2014-12-23 DIAGNOSIS — K8020 Calculus of gallbladder without cholecystitis without obstruction: Secondary | ICD-10-CM | POA: Insufficient documentation

## 2014-12-23 DIAGNOSIS — R93421 Abnormal radiologic findings on diagnostic imaging of right kidney: Secondary | ICD-10-CM | POA: Insufficient documentation

## 2014-12-23 DIAGNOSIS — K859 Acute pancreatitis without necrosis or infection, unspecified: Secondary | ICD-10-CM | POA: Diagnosis present

## 2014-12-23 NOTE — Telephone Encounter (Signed)
Discussed with pt's wife. Order for stat bw to be done Thursday am put in. Pt having u/s today. Pt transferred to front to schedule ov next week with dr Nicki Reaper for a follow up.

## 2014-12-29 LAB — CBC WITH DIFFERENTIAL/PLATELET
BASOS: 1 %
Basophils Absolute: 0 10*3/uL (ref 0.0–0.2)
EOS (ABSOLUTE): 0.2 10*3/uL (ref 0.0–0.4)
EOS: 3 %
HEMOGLOBIN: 10.1 g/dL — AB (ref 12.6–17.7)
Hematocrit: 28.9 % — ABNORMAL LOW (ref 37.5–51.0)
LYMPHS ABS: 2 10*3/uL (ref 0.7–3.1)
Lymphs: 31 %
MCH: 31.9 pg (ref 26.6–33.0)
MCHC: 34.9 g/dL (ref 31.5–35.7)
MCV: 91 fL (ref 79–97)
MONOCYTES: 11 %
Monocytes Absolute: 0.7 10*3/uL (ref 0.1–0.9)
Neutrophils Absolute: 3.5 10*3/uL (ref 1.4–7.0)
Neutrophils: 54 %
Platelets: 256 10*3/uL (ref 150–379)
RBC: 3.17 x10E6/uL — AB (ref 4.14–5.80)
RDW: 14 % (ref 12.3–15.4)
WBC: 6.4 10*3/uL (ref 3.4–10.8)

## 2014-12-29 LAB — AMYLASE: Amylase: 281 U/L — ABNORMAL HIGH (ref 31–124)

## 2014-12-29 LAB — LIPASE: Lipase: 93 U/L — ABNORMAL HIGH (ref 0–59)

## 2014-12-30 ENCOUNTER — Telehealth: Payer: Self-pay | Admitting: Family Medicine

## 2014-12-30 ENCOUNTER — Encounter: Payer: Self-pay | Admitting: Family Medicine

## 2014-12-30 ENCOUNTER — Ambulatory Visit (INDEPENDENT_AMBULATORY_CARE_PROVIDER_SITE_OTHER): Payer: Medicare Other | Admitting: Family Medicine

## 2014-12-30 VITALS — BP 148/80 | Temp 98.9°F | Wt 157.0 lb

## 2014-12-30 DIAGNOSIS — R109 Unspecified abdominal pain: Secondary | ICD-10-CM

## 2014-12-30 DIAGNOSIS — R1030 Lower abdominal pain, unspecified: Secondary | ICD-10-CM

## 2014-12-30 DIAGNOSIS — R634 Abnormal weight loss: Secondary | ICD-10-CM

## 2014-12-30 MED ORDER — SULFAMETHOXAZOLE-TRIMETHOPRIM 800-160 MG PO TABS: 1.0000 | ORAL_TABLET | Freq: Two times a day (BID) | ORAL | Status: DC

## 2014-12-30 NOTE — Progress Notes (Signed)
   Subjective:    Patient ID: [name], male    DOB: [birth year], 69 y.o.   MRN: [id number]  HPIfollow up abd pain and groin pain. Pt not feeling any better.  This patient states that he is having ongoing epigastric pain and abdominal pain that is waking him up and also keeps him from falling asleep he relates some nausea he has had some moderate weight loss over the past 6 months in addition to this amylase lipase numbers have been elevated. Also ultrasound was ordered but could not visualize the pancreas. Now patient having significant pain in the lower abdomen on the right side radiates into the testicle. Physical exam today patient does have testicular tenderness and mid epigastrium and midabdomen tenderness. Tenderness in scrotum.  Patient on PPI and this is not helping him. Having dizziness for awhile.   Patient unable to eat or drink much because of the discomfort. Patient still getting dialysis completed. 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  Review of Systems He denies fever he does relate nausea occasional vomiting no diarrhea no bloody stools no hematuria denies sweats or chills relates abdominal pain as stated above    Objective:   Physical Exam  Cachectic appearance lungs are clear heart regular abdomen is soft somewhat scaphoid subjective tenderness in the midepigastrium in mid abdomen region Extremities no edema blood pressure stable    lab work reviewed with family Assessment & Plan:  Because of abdominal pain weight loss persistent elevated lipase amylase and inability to see the pancreas on ultrasound plus also nocturnal abdominal pain and groin pain along with right lower quadrant pain CAT scan is indicated to get a better grasp on what is going on patient with aneurysm as well as pancreatic enzyme elevations could be having aneurysm related issue or could be having a pancreatic growth causing elevated enzymes  as well as weight loss patient may need testicular ultrasound in the future but for now start first with CAT scan because patient is on dialysis we cannot do IV contrast patient can use oral contrast.

## 2014-12-30 NOTE — Telephone Encounter (Signed)
Need OV note for today to process prior authorization for the CT scan that is scheduled for 01/01/15

## 2014-12-30 NOTE — Telephone Encounter (Signed)
This was completed, thank you

## 2015-01-01 ENCOUNTER — Ambulatory Visit (HOSPITAL_COMMUNITY)
Admission: RE | Admit: 2015-01-01 | Discharge: 2015-01-01 | Disposition: A | Discharge status: Home / Self Care | Payer: Medicare Other | Source: Ambulatory Visit | Attending: Family Medicine | Admitting: Family Medicine

## 2015-01-01 DIAGNOSIS — R109 Unspecified abdominal pain: Secondary | ICD-10-CM | POA: Insufficient documentation

## 2015-01-01 DIAGNOSIS — R1030 Lower abdominal pain, unspecified: Secondary | ICD-10-CM | POA: Insufficient documentation

## 2015-01-01 DIAGNOSIS — R634 Abnormal weight loss: Secondary | ICD-10-CM | POA: Diagnosis present

## 2015-01-01 DIAGNOSIS — K5641 Fecal impaction: Secondary | ICD-10-CM | POA: Insufficient documentation

## 2015-01-05 ENCOUNTER — Other Ambulatory Visit: Payer: Self-pay | Admitting: Family Medicine

## 2015-01-05 NOTE — Addendum Note (Signed)
Addended by: Launa Grill on: 01/05/2015 05:18 PM   Modules accepted: Orders

## 2015-01-06 ENCOUNTER — Telehealth: Payer: Self-pay | Admitting: Family Medicine

## 2015-01-06 ENCOUNTER — Encounter: Payer: Self-pay | Admitting: Family Medicine

## 2015-01-06 ENCOUNTER — Other Ambulatory Visit (HOSPITAL_COMMUNITY)
Admission: RE | Admit: 2015-01-06 | Discharge: 2015-01-06 | Disposition: A | Discharge status: Home / Self Care | Payer: Medicare Other | Source: Ambulatory Visit | Attending: Family Medicine | Admitting: Family Medicine

## 2015-01-06 ENCOUNTER — Ambulatory Visit (INDEPENDENT_AMBULATORY_CARE_PROVIDER_SITE_OTHER): Payer: Medicare Other | Admitting: Family Medicine

## 2015-01-06 VITALS — BP 130/80 | Ht 75.0 in | Wt 157.2 lb

## 2015-01-06 DIAGNOSIS — R0600 Dyspnea, unspecified: Secondary | ICD-10-CM

## 2015-01-06 DIAGNOSIS — R7989 Other specified abnormal findings of blood chemistry: Secondary | ICD-10-CM

## 2015-01-06 DIAGNOSIS — K8500 Idiopathic acute pancreatitis without necrosis or infection: Secondary | ICD-10-CM | POA: Diagnosis not present

## 2015-01-06 DIAGNOSIS — R791 Abnormal coagulation profile: Secondary | ICD-10-CM | POA: Diagnosis present

## 2015-01-06 DIAGNOSIS — R0602 Shortness of breath: Secondary | ICD-10-CM | POA: Diagnosis present

## 2015-01-06 DIAGNOSIS — R0902 Hypoxemia: Secondary | ICD-10-CM | POA: Diagnosis not present

## 2015-01-06 DIAGNOSIS — R1030 Lower abdominal pain, unspecified: Secondary | ICD-10-CM

## 2015-01-06 DIAGNOSIS — J439 Emphysema, unspecified: Secondary | ICD-10-CM | POA: Diagnosis not present

## 2015-01-06 DIAGNOSIS — I7090 Unspecified atherosclerosis: Secondary | ICD-10-CM | POA: Diagnosis not present

## 2015-01-06 LAB — BRAIN NATRIURETIC PEPTIDE: B Natriuretic Peptide: 188 pg/mL — ABNORMAL HIGH (ref 0.0–100.0)

## 2015-01-06 LAB — AMYLASE: AMYLASE: 339 U/L — AB (ref 28–100)

## 2015-01-06 LAB — D-DIMER, QUANTITATIVE (NOT AT ARMC)

## 2015-01-06 LAB — LIPASE, BLOOD: Lipase: 60 U/L — ABNORMAL HIGH (ref 22–51)

## 2015-01-06 NOTE — Progress Notes (Signed)
   Subjective:    Patient ID: [name], male    DOB: 12-18-1945, 69 y.o.   MRN: [id number]  HPI Patient is here today for a follow up on abdominal pain and discuss result lab and radiology reports.  Recent CAT scan did not show any tumor in the pancreas discuss with the family. There is an area in the: That is concerning we will be doing a barium enema in the near future  Wife states that patient is experiencing some shortness of breath lately. Significant shortness breath Set over the past couple weeks shortness of breath with walking. Denies chest pressure tightness pain or sweats with no cough no wheezing no hemoptysis. Energy level subpar. Does dialysis 3 times a week.  Patient relates testicular pain on the right side. Intermittently.  Review of Systems  Constitutional: Positive for fatigue. Negative for fever and activity change.  HENT: Negative for congestion, ear pain and rhinorrhea.   Eyes: Negative for discharge.  Respiratory: Positive for cough and shortness of breath. Negative for wheezing.   Cardiovascular: Negative for chest pain.  Gastrointestinal: Negative for nausea, vomiting, abdominal pain and diarrhea.  Genitourinary: Positive for testicular pain.       Objective:   Physical Exam  Constitutional: He appears well-developed.  HENT:  Head: Normocephalic.  Mouth/Throat: Oropharynx is clear and moist. No oropharyngeal exudate.  Neck: Normal range of motion.  Cardiovascular: Normal rate, regular rhythm and normal heart sounds.   No murmur heard. Pulmonary/Chest: Effort normal and breath sounds normal. He has no wheezes.  Lymphadenopathy:    He has no cervical adenopathy.  Neurological: He exhibits normal muscle tone.  Skin: Skin is warm and dry.  Nursing note and vitals reviewed.         Assessment & Plan:  Pancreatitis we'll go ahead and do lab work await the results.  Right testicle pain discomfort referral to urology no obvious infection or tumor  noted  Dyspnea we will check lab work to look for heart failure as well as possibility of blood clots in the lungs. Patient could not do CT scan with contrast but might be him to do VQ scan await the results chest x-ray ordered as well also O2 sat was 87% here we will recheck it at the hospital I'm not confident in our O2 sat meter  Possible: Mass barium enema pending  25 minutes was spent with the patient. Greater than half the time was spent in discussion and answering questions and counseling regarding the issues that the patient came in for today.

## 2015-01-06 NOTE — Telephone Encounter (Signed)
Called patient's wife and informed her per Dr.Scott Luking-Lab test shows some improvement with lipase which has to do with his pancreas. The tests also does not show any signs of heart failure. Tests does show some risk of the development of a pulmonary embolus. The patient cannot do a CT Angio because of renal failure-on dialysis. Therefore needs VQ scan. Unfortunately patient already scheduled for barium enema on Thursday. My advice would be to get the VQ scan done on Thursday even if it means rescheduling the barium enema for a couple weeks down the road. Talk with wife, set up tests. Because of his dialysis schedule it would be best for this test to be done on Thursday. Patient wife verbalized understanding. Informed patient's wife also that appointment for VQ scan is 01/08/15 @ 8:00 am and Barium Enema is scheduled for 01/20/15 @ 9:00 am. Patient's wife verbalized understanding.

## 2015-01-06 NOTE — Addendum Note (Signed)
Addended by: Ofilia Neas R on: 01/06/2015 03:03 PM   Modules accepted: Orders

## 2015-01-06 NOTE — Telephone Encounter (Signed)
Please call the wife.Chyrese. Lab test shows some improvement with lipase which has to do with his pancreas. The tests also does not show any signs of heart failure. Tests does show some risk of the development of a pulmonary embolus. The patient cannot do a CT Angio because of renal failure-on dialysis. Therefore needs VQ scan. Unfortunately patient already scheduled for barium enema on Thursday. My advice would be to get the VQ scan done on Thursday even if it means rescheduling the barium enema for a couple weeks down the road. Talk with wife, set up tests. Because of his dialysis schedule it would be best for this test to be done on Thursday.

## 2015-01-08 ENCOUNTER — Encounter (HOSPITAL_COMMUNITY): Payer: Self-pay

## 2015-01-08 ENCOUNTER — Encounter (HOSPITAL_COMMUNITY)
Admission: RE | Admit: 2015-01-08 | Discharge: 2015-01-08 | Disposition: A | Discharge status: Home / Self Care | Payer: Medicare Other | Source: Ambulatory Visit | Attending: Family Medicine | Admitting: Family Medicine

## 2015-01-08 ENCOUNTER — Inpatient Hospital Stay (HOSPITAL_COMMUNITY)
Admission: RE | Admit: 2015-01-08 | Discharge: 2015-01-08 | Disposition: A | Discharge status: Home / Self Care | Payer: Medicare Other | Source: Ambulatory Visit

## 2015-01-08 ENCOUNTER — Ambulatory Visit (HOSPITAL_COMMUNITY)
Admission: RE | Admit: 2015-01-08 | Discharge: 2015-01-08 | Disposition: A | Discharge status: Home / Self Care | Payer: Medicare Other | Source: Ambulatory Visit | Attending: Family Medicine | Admitting: Family Medicine

## 2015-01-08 ENCOUNTER — Other Ambulatory Visit (HOSPITAL_COMMUNITY): Payer: Medicare Other

## 2015-01-08 DIAGNOSIS — J439 Emphysema, unspecified: Secondary | ICD-10-CM | POA: Insufficient documentation

## 2015-01-08 DIAGNOSIS — R791 Abnormal coagulation profile: Secondary | ICD-10-CM | POA: Insufficient documentation

## 2015-01-08 DIAGNOSIS — I7090 Unspecified atherosclerosis: Secondary | ICD-10-CM | POA: Insufficient documentation

## 2015-01-08 MED ORDER — TECHNETIUM TC 99M DIETHYLENETRIAME-PENTAACETIC ACID
30.0000 | Freq: Once | INTRAVENOUS | Status: DC | PRN
  Administered 2015-01-08: 33 via RESPIRATORY_TRACT
  Filled 2015-01-08: qty 30

## 2015-01-08 MED ORDER — TECHNETIUM TO 99M ALBUMIN AGGREGATED
4.0000 | Freq: Once | INTRAVENOUS | Status: AC | PRN
  Administered 2015-01-08: 4 via INTRAVENOUS

## 2015-01-08 MED ORDER — SODIUM CHLORIDE 0.9 % IJ SOLN
INTRAMUSCULAR | Status: AC
  Filled 2015-01-08: qty 48

## 2015-01-12 NOTE — Therapy (Signed)
[place], [place], [address] Phone: [phone]   Fax:  [phone]  Patient Details  Name: NICKLOUS ABURTO MRN: [id number] Date of Birth: [birth year] Referring Provider:  Kathyrn Drown, MD  Encounter Date: 01/12/2015  PHYSICAL THERAPY DISCHARGE SUMMARY  Visits from Start of Care: 12  Current functional level related to goals / functional outcomes: Patient has not returned since last visit on 07/31/14   Remaining deficits: Unable to assess    Education / Equipment: N/A  Plan: Patient agrees to discharge.  Patient goals were partially met. Patient is being discharged due to not returning since the last visit.  ?????       Deniece Ree PT, DPT [place] [address] [place], [place], [address] Phone: [phone]   Fax:  [phone]

## 2015-01-13 ENCOUNTER — Encounter: Payer: Self-pay | Admitting: Neurology

## 2015-01-13 ENCOUNTER — Ambulatory Visit (INDEPENDENT_AMBULATORY_CARE_PROVIDER_SITE_OTHER): Payer: Medicare Other | Admitting: Neurology

## 2015-01-13 VITALS — BP 128/76 | HR 81 | Ht 75.0 in | Wt 159.2 lb

## 2015-01-13 DIAGNOSIS — I6990 Unspecified sequelae of unspecified cerebrovascular disease: Secondary | ICD-10-CM

## 2015-01-13 NOTE — Patient Instructions (Signed)
I had a long d/w patient and his daughter about his recent stroke, risk for recurrent stroke/TIAs, personally independently reviewed imaging studies and stroke evaluation results and answered questions.Continue aspirin 81 mg daily  for secondary stroke prevention and maintain strict control of hypertension with blood pressure goal below 130/90, diabetes with hemoglobin A1c goal below 6.5% and lipids with LDL cholesterol goal below 100 mg/dL. I also advised the patient to eat a healthy diet with plenty of whole grains, cereals, fruits and vegetables, exercise regularly and maintain ideal body weight. We also talked about atrial fibrillation and warfarin but given patient's renal failure, history of several microhemorrhages and recent hemorrhagic infarct in the brain, and his poor balance and fall risk I believe the risk of bleeding complications with warfarin is too high to justify it more aware he has done well over the last 6 months on aspirin without recurrent stroke or TIA is hence we will continue aspirin for now. Followup in the future with me in one year or call earlier if necessary.

## 2015-01-13 NOTE — Progress Notes (Signed)
STROKE NEUROLOGY FOLLOW UP NOTE  NAME: [name] DOB: 27-Apr-1945  REASON FOR VISIT: stroke follow up HISTORY FROM: chart and wife  Today we had the pleasure of seeing [name] in follow-up at our Neurology Clinic. Pt was accompanied by wife.   History Summary [name] is a 69 y.o. male hx of prior CVA, multiple TIAs, A. Fib on ASA 81, HTN, HLD, cardiomyopathy, CKD, CAD, AAA s/p repair was admitted to [facility] on 05/23/14 for acute onset of dizziness and generalized weakness that subsequently resolved. Head CT and MRI completed shows questionable small acute right IC ICH vs. Hemorrhagic infarct.His ASA 81mg  at home was discontinued due to bleeding. His blood pressure was tightly controlled. He was in the intensive care for 2 days and remained neurologically stable. He had no focal deficits but had mild gait imbalance. He was seen by PT/OT and considered stable for discharge home. He was advised to be compliant with his BP meds and keep follow-up with primary care physician.   [name] 07/21/2014 visit: During the interval time, the patient has been doing well. He has PT/OT twice a week. He has been back on ASA 81mg . However, his BP still not in good control today 174/64. Wife said his Bp at home also 154-176/70-80. He has been on amlodipine, coreg, clonidine and hydralazine but still difficult in control. His CKD gradually getting worse and recently got fistular done by [name] for preparation of HD in the future. Wife said he lack of motivation, he not moving much at home and not doing exercise. He went to rehab but came back directly go to bed.   As per wife, he has been followed up with [name] in 2007 or 2008 for his previous stroke. Since then he had numerous TIAs but did not keep following up with [name]. He was seen by [name] again this March in [place] and was asked to follow up with [name] in clinic 2 months after discharge.    Update 01/13/2015 : He returns for  follow-up today accompanied by his daughter. He continues to do well without recurrent stroke or TIA symptoms. He is tolerating aspirin well without bleeding or bruising. He continues dialysis 3 days a week. States his blood pressure is well controlled and today it is 128/76. He had lipid profile checked a few months ago and was fine. He is living at home with his wife is independent in activities of daily living. He walks with a wheeled walker and states his balance is poor though he has not had any falls. He has not is mild memory difficulties since her stroke but these are not progressive. He has no new complaints. REVIEW OF SYSTEMS: Full 14 system review of systems performed and notable only for those listed below and in HPI above, all others are negative:   Dizziness, imbalance, gait difficulty and all other systems negative The following represents the patient's updated allergies and side effects list: No Known Allergies  The neurologically relevant items on the patient's problem list were reviewed on today's visit.  Neurologic Examination  A problem focused neurological exam (12 or more points of the single system neurologic examination, vital signs counts as 1 point, cranial nerves count for 8 points) was performed.  Blood pressure 128/76, pulse 81, height 6\' 3"  (1.905 m), weight 159 lb 3.2 oz (72.213 kg).  General - frail elderly African-American male, in no apparent distress.  Ophthalmologic - fundi not visualized  due to incorporation.  Cardiovascular - Regular rate and rhythm, no irregular heart beat.  Mental Status -  Level of arousal and orientation to time, place, and person were intact. Language including expression, naming, repetition, comprehension was assessed and found intact. Fund of Knowledge was assessed and was impaired   Mini-Mental status exam scored 23/30 with deficits in attention, calculation and recall. Clock drawing 3/4. Animal naming 7 only. Cranial Nerves II -  XII - II - Visual field intact OU. III, IV, VI - Extraocular movements intact. V - Facial sensation intact bilaterally. VII - Facial movement intact bilaterally. VIII - Hearing & vestibular intact bilaterally. X - Palate elevates symmetrically. XI - Chin turning & shoulder shrug intact bilaterally. XII - Tongue protrusion intact.  Motor Strength - The patient's strength was normal in all extremities and pronator drift was absent.  Bulk was normal and fasciculations were absent.   Motor Tone - Muscle tone was assessed at the neck and appendages and was normal.  Reflexes - The patient's reflexes were 1+ in all extremities and he had no pathological reflexes.  Sensory - Light touch, temperature/pinprick were assessed and were normal.    Coordination - The patient had normal movements in the hands with no ataxia or dysmetria.  Tremor was absent.  Gait and Station - walk with walker, slow and small stride, slow turns, but steady.  Data reviewed: I personally reviewed the images and agree with the radiology interpretations.  Dg Chest 2 View 05/23/2014  Severe emphysema without acute cardiopulmonary disease.   Ct Head Wo Contrast 05/23/2014  4 mm focus of acute hemorrhage posterior limb internal capsule on the right as suggested by MRI performed earlier today.   05/25/14 1. Slight interval decrease in size of subcentimeter focal hemorrhage involving the posterior limb of the right internal capsule, now 3 mm. 2. No new intracranial abnormality.  Mr Brain Wo Contrast  05/23/2014  Cannot exclude acute RIGHT posterior limb internal capsule/anterior thalamic hemorrhage, with or without associated acute ischemia. CT scan without contrast recommended. See discussion above. Possible acute RIGHT internal capsule infarct more inferiorly as seen best on coronal diffusion sequence. Correlate clinically for LEFT-sided weakness. Advanced atrophy and chronic microvascular ischemic change,  with numerous microbleeds which have developed since previous brain MR in 2014, most of which however are likely chronic. Widespread areas of chronic lacunar infarction and BILATERAL cerebellar hemispheric infarction.   US Abdomen Complete 05/23/2014  1. Cholelithiasis without evidence of cholecystitis.  2. Chronic kidney disease with asymmetric atrophy of the right kidney.  3. No acute findings identified.   CUS - Bilateral: 1-39% ICA stenosis. Vertebral artery flow is antegrade.  2D echo - - Left ventricle: The cavity size was normal. There was moderate concentric hypertrophy. Systolic function was normal. The estimated ejection fraction was in the range of 50% to 55%. Wall motion was normal; there were no regional wall motion abnormalities. Doppler parameters are consistent with abnormal left ventricular relaxation (grade 1 diastolic dysfunction). Doppler parameters are consistent with high ventricular filling pressure. - Ventricular septum: Septal motion showed abnormal function and dyssynergy. These changes are consistent with intraventricular conduction delay. - Aortic valve: Mildly thickened, mildly calcified leaflets. There was moderate regurgitation. - Mitral valve: There was trivial regurgitation. - Left atrium: The atrium was moderately dilated. Volume/bsa, ES (1-plane Simpson&'s, A4C): 37.1 ml/m^2. - Right atrium: The atrium was mildly dilated. - Tricuspid valve: There was trivial regurgitation.  Assessment: As you may recall, he is a 70 y.o.  African American male with PMH of prior CVA, multiple TIAs, A. Fib on ASA 81, HTN, HLD, cardiomyopathy, CKD, CAD, AAA s/p repair was admitted to [facility] on 05/23/14 for questionable small acute right IC ICH vs. Hemorrhagic infarct. Mild cognitive impairment post stroke Plan:  -  I had a long d/w patient and his daughter about his recent stroke, risk for recurrent stroke/TIAs, personally independently reviewed  imaging studies and stroke evaluation results and answered questions.Continue aspirin 81 mg daily  for secondary stroke prevention and maintain strict control of hypertension with blood pressure goal below 130/90, diabetes with hemoglobin A1c goal below 6.5% and lipids with LDL cholesterol goal below 100 mg/dL. I also advised the patient to eat a healthy diet with plenty of whole grains, cereals, fruits and vegetables, exercise regularly and maintain ideal body weight. We also talked about atrial fibrillation and warfarin but given patient's renal failure, history of several microhemorrhages and recent hemorrhagic infarct in the brain, and his poor balance and fall risk I believe the risk of bleeding complications with warfarin is too high to justify it more aware he has done well over the last 6 months on aspirin without recurrent stroke or TIA is hence we will continue aspirin for now. Followup in the future with me in one year or call earlier if necessary.  No orders of the defined types were placed in this encounter.    No orders of the defined types were placed in this encounter.       Antony Contras, MD [facility] [address] [place], [place] [id number] [phone]

## 2015-01-14 ENCOUNTER — Other Ambulatory Visit: Payer: Self-pay

## 2015-01-15 ENCOUNTER — Ambulatory Visit (HOSPITAL_COMMUNITY)
Admission: RE | Admit: 2015-01-15 | Discharge: 2015-01-15 | Disposition: A | Discharge status: Home / Self Care | Payer: Medicare Other | Source: Ambulatory Visit | Attending: Vascular Surgery | Admitting: Vascular Surgery

## 2015-01-15 ENCOUNTER — Encounter (HOSPITAL_COMMUNITY): Payer: Self-pay | Admitting: Vascular Surgery

## 2015-01-15 ENCOUNTER — Encounter (HOSPITAL_COMMUNITY)
Admission: RE | Disposition: A | Discharge status: Home / Self Care | Payer: Self-pay | Source: Ambulatory Visit | Attending: Vascular Surgery

## 2015-01-15 ENCOUNTER — Telehealth: Payer: Self-pay | Admitting: Vascular Surgery

## 2015-01-15 ENCOUNTER — Other Ambulatory Visit: Payer: Self-pay | Admitting: *Deleted

## 2015-01-15 DIAGNOSIS — Z7982 Long term (current) use of aspirin: Secondary | ICD-10-CM | POA: Diagnosis not present

## 2015-01-15 DIAGNOSIS — Y832 Surgical operation with anastomosis, bypass or graft as the cause of abnormal reaction of the patient, or of later complication, without mention of misadventure at the time of the procedure: Secondary | ICD-10-CM | POA: Insufficient documentation

## 2015-01-15 DIAGNOSIS — I132 Hypertensive heart and chronic kidney disease with heart failure and with stage 5 chronic kidney disease, or end stage renal disease: Secondary | ICD-10-CM | POA: Diagnosis not present

## 2015-01-15 DIAGNOSIS — Z8673 Personal history of transient ischemic attack (TIA), and cerebral infarction without residual deficits: Secondary | ICD-10-CM | POA: Insufficient documentation

## 2015-01-15 DIAGNOSIS — Z87891 Personal history of nicotine dependence: Secondary | ICD-10-CM | POA: Diagnosis not present

## 2015-01-15 DIAGNOSIS — I2510 Atherosclerotic heart disease of native coronary artery without angina pectoris: Secondary | ICD-10-CM | POA: Insufficient documentation

## 2015-01-15 DIAGNOSIS — F419 Anxiety disorder, unspecified: Secondary | ICD-10-CM | POA: Diagnosis not present

## 2015-01-15 DIAGNOSIS — F1010 Alcohol abuse, uncomplicated: Secondary | ICD-10-CM | POA: Diagnosis not present

## 2015-01-15 DIAGNOSIS — Z992 Dependence on renal dialysis: Secondary | ICD-10-CM | POA: Insufficient documentation

## 2015-01-15 DIAGNOSIS — J449 Chronic obstructive pulmonary disease, unspecified: Secondary | ICD-10-CM | POA: Diagnosis not present

## 2015-01-15 DIAGNOSIS — N186 End stage renal disease: Secondary | ICD-10-CM | POA: Diagnosis not present

## 2015-01-15 DIAGNOSIS — E785 Hyperlipidemia, unspecified: Secondary | ICD-10-CM | POA: Diagnosis not present

## 2015-01-15 DIAGNOSIS — I429 Cardiomyopathy, unspecified: Secondary | ICD-10-CM | POA: Diagnosis not present

## 2015-01-15 DIAGNOSIS — I252 Old myocardial infarction: Secondary | ICD-10-CM | POA: Diagnosis not present

## 2015-01-15 DIAGNOSIS — I714 Abdominal aortic aneurysm, without rupture: Secondary | ICD-10-CM | POA: Diagnosis not present

## 2015-01-15 DIAGNOSIS — D649 Anemia, unspecified: Secondary | ICD-10-CM | POA: Insufficient documentation

## 2015-01-15 DIAGNOSIS — M1990 Unspecified osteoarthritis, unspecified site: Secondary | ICD-10-CM | POA: Insufficient documentation

## 2015-01-15 DIAGNOSIS — F329 Major depressive disorder, single episode, unspecified: Secondary | ICD-10-CM | POA: Insufficient documentation

## 2015-01-15 DIAGNOSIS — I5022 Chronic systolic (congestive) heart failure: Secondary | ICD-10-CM | POA: Diagnosis not present

## 2015-01-15 DIAGNOSIS — Z4931 Encounter for adequacy testing for hemodialysis: Secondary | ICD-10-CM

## 2015-01-15 DIAGNOSIS — I4891 Unspecified atrial fibrillation: Secondary | ICD-10-CM | POA: Diagnosis not present

## 2015-01-15 DIAGNOSIS — T82858A Stenosis of vascular prosthetic devices, implants and grafts, initial encounter: Secondary | ICD-10-CM | POA: Diagnosis not present

## 2015-01-15 DIAGNOSIS — T82898A Other specified complication of vascular prosthetic devices, implants and grafts, initial encounter: Secondary | ICD-10-CM | POA: Diagnosis not present

## 2015-01-15 HISTORY — PX: PERIPHERAL VASCULAR CATHETERIZATION: SHX172C

## 2015-01-15 LAB — POCT I-STAT, CHEM 8
BUN: 28 mg/dL — ABNORMAL HIGH (ref 6–20)
CALCIUM ION: 1.17 mmol/L (ref 1.13–1.30)
Chloride: 101 mmol/L (ref 101–111)
Creatinine, Ser: 6.1 mg/dL — ABNORMAL HIGH (ref 0.61–1.24)
Glucose, Bld: 88 mg/dL (ref 65–99)
HEMATOCRIT: 29 % — AB (ref 39.0–52.0)
HEMOGLOBIN: 9.9 g/dL — AB (ref 13.0–17.0)
Potassium: 4.4 mmol/L (ref 3.5–5.1)
SODIUM: 140 mmol/L (ref 135–145)
TCO2: 25 mmol/L (ref 0–100)

## 2015-01-15 SURGERY — A/V SHUNTOGRAM/FISTULAGRAM
Laterality: Left

## 2015-01-15 MED ORDER — HEPARIN (PORCINE) IN NACL 2-0.9 UNIT/ML-% IJ SOLN
INTRAMUSCULAR | Status: DC | PRN
  Administered 2015-01-15: 5 mL

## 2015-01-15 MED ORDER — HEPARIN (PORCINE) IN NACL 2-0.9 UNIT/ML-% IJ SOLN
INTRAMUSCULAR | Status: AC
  Filled 2015-01-15: qty 500

## 2015-01-15 MED ORDER — HEPARIN SODIUM (PORCINE) 1000 UNIT/ML IJ SOLN
INTRAMUSCULAR | Status: DC | PRN
  Administered 2015-01-15: 3000 [IU] via INTRAVENOUS

## 2015-01-15 MED ORDER — LIDOCAINE HCL (PF) 1 % IJ SOLN
INTRAMUSCULAR | Status: AC
Start: 2015-01-15 — End: 2015-01-15
  Filled 2015-01-15: qty 30

## 2015-01-15 MED ORDER — SODIUM CHLORIDE 0.9 % IJ SOLN: 3.0000 mL | Freq: Two times a day (BID) | INTRAMUSCULAR | Status: DC

## 2015-01-15 MED ORDER — FENTANYL CITRATE (PF) 100 MCG/2ML IJ SOLN
INTRAMUSCULAR | Status: AC
  Filled 2015-01-15: qty 4

## 2015-01-15 MED ORDER — IODIXANOL 320 MG/ML IV SOLN
INTRAVENOUS | Status: DC | PRN
  Administered 2015-01-15: 40 mL via INTRAVENOUS

## 2015-01-15 MED ORDER — SODIUM CHLORIDE 0.9 % IJ SOLN: 3.0000 mL | INTRAMUSCULAR | Status: DC | PRN

## 2015-01-15 MED ORDER — ACETAMINOPHEN 325 MG PO TABS: 650.0000 mg | ORAL_TABLET | ORAL | Status: DC | PRN

## 2015-01-15 MED ORDER — FENTANYL CITRATE (PF) 100 MCG/2ML IJ SOLN
INTRAMUSCULAR | Status: DC | PRN
  Administered 2015-01-15: 25 ug via INTRAVENOUS

## 2015-01-15 MED ORDER — HEPARIN SODIUM (PORCINE) 1000 UNIT/ML IJ SOLN
INTRAMUSCULAR | Status: AC
  Filled 2015-01-15: qty 1

## 2015-01-15 MED ORDER — SODIUM CHLORIDE 0.9 % IV SOLN: 250.0000 mL | INTRAVENOUS | Status: DC | PRN

## 2015-01-15 SURGICAL SUPPLY — 15 items
BAG SNAP BAND KOVER 36X36 (MISCELLANEOUS) ×3 IMPLANT
BALLN MUSTANG 6X80X75 (BALLOONS) ×3
BALLOON MUSTANG 6X80X75 (BALLOONS) IMPLANT
COVER DOME SNAP 22 D (MISCELLANEOUS) ×3 IMPLANT
COVER PRB 48X5XTLSCP FOLD TPE (BAG) ×1 IMPLANT
COVER PROBE 5X48 (BAG) ×3
KIT ENCORE 26 ADVANTAGE (KITS) ×2 IMPLANT
KIT MICROINTRODUCER STIFF 5F (SHEATH) ×3 IMPLANT
PROTECTION STATION PRESSURIZED (MISCELLANEOUS) ×3
SHEATH PINNACLE R/O II 5F 6CM (SHEATH) ×2 IMPLANT
STATION PROTECTION PRESSURIZED (MISCELLANEOUS) ×1 IMPLANT
STOPCOCK MORSE 400PSI 3WAY (MISCELLANEOUS) ×3 IMPLANT
TRAY PV CATH (CUSTOM PROCEDURE TRAY) ×3 IMPLANT
TUBING CIL FLEX 10 FLL-RA (TUBING) ×3 IMPLANT
WIRE BENTSON .035X145CM (WIRE) ×3 IMPLANT

## 2015-01-15 NOTE — Telephone Encounter (Signed)
LM for pts wife to call for appt info, dpm

## 2015-01-15 NOTE — H&P (Signed)
Brief History and Physical  History of Present Illness  [name] is a 69 y.o. male who presents with chief complaint: poor flow rates in L BC AVF.  The patient presents today for L arm fistulogram, possible intervention.    Past Medical History  Diagnosis Date  . Hypertension   . COPD (chronic obstructive pulmonary disease) ([place])   . Hyperlipidemia   . Cardiomyopathy     EF of 30% per echo in June of 2012; admitted with congestive heart failure; nonobstructive CAD on cath in 08/2010  . History of noncompliance with medical treatment   . Cerebrovascular disease 2011    CVA  in 02/2010-only deficit is decreased vision in  right eye  . Alcohol abuse   . Tobacco abuse     40 pack years  . Abdominal aortic aneurysm ([place])     Stent graft repair  . Chronic systolic CHF (congestive heart failure) ([place])   . Leg pain   . CKD (chronic kidney disease) stage 3, GFR 30-59 ml/min     creatinine-1.7 in 08/2010  . CAD (coronary artery disease)   . Myocardial infarction acute ([place]) 10/01/11  . CKD (chronic kidney disease), stage III 12/06/2012  . Effusion of right knee joint 12/06/2012  . Anemia   . Atrial fibrillation ([place])   . Ataxic gait   . Weakness generalized   . Frequent falls   . Forgetfulness   . Stroke [facility]) 2011    decreased vision of right eye    Most recent 05/23/14 - bleed - has a drag to his left leg  . Depression   . Anxiety     Takes Citalopram  . Arthritis     knees  . Renal insufficiency     Past Surgical History  Procedure Laterality Date  . Colonoscopy w/ polypectomy  2006    Dr. Tamala Julian: anal fissure, sessile adenomatous polyp, diverticulosis  . Esophagogastroduodenoscopy  11/15/2010    Procedure: ESOPHAGOGASTRODUODENOSCOPY (EGD);  Surgeon: Dorothyann Peng, MD;  Location: [facility];  Service: Endoscopy;  Laterality: N/A;  with propofol sedation; procedure start @ 0813  . Flexible sigmoidoscopy  11/15/2010    Procedure: FLEXIBLE SIGMOIDOSCOPY;  Surgeon: Dorothyann Peng, MD;  Location: [facility];  Service: Endoscopy;  Laterality: N/A;  with propofol sedation; ended @ 0808  . Polypectomy  12/13/2010    Procedure: POLYPECTOMY;  Surgeon: Dorothyann Peng, MD;  Location: [facility];  Service: Endoscopy;  Laterality: N/A;  right colon, cecal, transverse, and sigmoid polypectomy  . Cardiac catheterization  10/04/11    Normal left main, 95% pLAD stenosis with ulceration s/p successful BMS placement, 30% segmental plaque beyond this, dLAD after D2 with 50% plaquing, then focal 70% lesion, 80% focal short D2 stenosis, 30% pOM2 lesion, 30-40% mOM3 stenosis, Shephard's crook region of RCA with 50% lesion, mRCA 50-60% lesion and mild dRCA irregularities.   . Coronary stent placement  10/04/11  . Abdominal aortic aneurysm repair w/ endoluminal graft      01/16/2008  . Left heart catheterization with coronary angiogram N/A 10/04/2011    Procedure: LEFT HEART CATHETERIZATION WITH CORONARY ANGIOGRAM;  Surgeon: Hillary Bow, MD;  Location: [facility] C[facility] LAB;  Service: Cardiovascular;  Laterality: N/A;  . Av fistula placement Left 07/07/2014    Procedure: Creation of Left Arm ARTERIOVENOUS Fistula;  Surgeon: Rosetta Posner, MD;  Location: [place];  Service: Vascular;  Laterality: Left;  . Insertion of dialysis catheter Right 08/05/2014  Procedure: INSERTION OF DIALYSIS CATHETER Right internal Jugular;  Surgeon: Conrad Midway, MD;  Location: [place];  Service: Vascular;  Laterality: Right;  . Port-a-cath removal      Social History   Social History  . Marital Status: Married    Spouse Name: N/A  . Number of Children: N/A  . Years of Education: N/A   Occupational History  . retired     [employer]   Social History Main Topics  . Smoking status: Former Smoker -- 0.50 packs/day for 50 years    Types: Cigarettes    Start date: 06/25/1957    Quit date: 04/04/2014  . Smokeless tobacco: Never Used  . Alcohol Use: No  . Drug Use: No  . Sexual Activity: No   Other Topics Concern    . Not on file   Social History Narrative   Married, retired from [employer], 6 children   Right handed   Caffeine use - none    Family History  Problem Relation Age of Onset  . Colon cancer Neg Hx   . Anesthesia problems Neg Hx   . Hypotension Neg Hx   . Malignant hyperthermia Neg Hx   . Pseudochol deficiency Neg Hx   . Cancer Mother     Gall Bladder  . Heart disease Mother     before age 49  . Hyperlipidemia Mother   . Hypertension Mother   . Asthma Mother   . Cancer Sister     Cervical    No current facility-administered medications on file prior to encounter.   Current Outpatient Prescriptions on File Prior to Encounter  Medication Sig Dispense Refill  . aspirin EC 81 MG tablet Take 81 mg by mouth daily.    Marland Kitchen atorvastatin (LIPITOR) 80 MG tablet TAKE 1 TABLET (80 MG TOTAL) BY MOUTH DAILY AT 6 PM. 90 tablet 3  . carvedilol (COREG) 3.125 MG tablet Take 1 tablet (3.125 mg total) by mouth 2 (two) times daily. 60 tablet 6  . citalopram (CELEXA) 10 MG tablet TAKE 1 TABLET BY MOUTH EVERY DAY 30 tablet 2  . pantoprazole (PROTONIX) 40 MG tablet Take 1 tablet (40 mg total) by mouth daily. 30 tablet 11  . polyethylene glycol (MIRALAX / GLYCOLAX) packet Take 17 g by mouth daily. (Patient taking differently: Take 17 g by mouth daily as needed for mild constipation. ) 14 each 0  . VOLTAREN 1 % GEL Apply 4 g topically daily as needed (for knee pain). Bilateral Knee  1  . ZETIA 10 MG tablet TAKE 1 TABLET (10 MG TOTAL) BY MOUTH DAILY. 30 tablet 6  . [DISCONTINUED] metoprolol (TOPROL-XL) 50 MG 24 hr tablet Take 50 mg by mouth daily.      . [DISCONTINUED] niacin (NIASPAN) 500 MG CR tablet Take 500 mg by mouth at bedtime.      . [DISCONTINUED] omeprazole (PRILOSEC) 20 MG capsule 1 po every morning 30 capsule 5    No Known Allergies  Review of Systems: As listed above, otherwise negative.  Physical Examination  Filed Vitals:   01/15/15 0624  BP: 157/83  Pulse: 64  Temp: 98.3 F  (36.8 C)  TempSrc: Oral  Resp: 20  Height: 6\' 3"  (1.905 m)  Weight: 160 lb (72.576 kg)  SpO2: 100%    General: A&O x 3, WDWN  Pulmonary: Sym exp, good air movt, CTAB, no rales, rhonchi, & wheezing  Cardiac: RRR, Nl S1, S2, no Murmurs, rubs or gallops  Gastrointestinal: soft, NTND, -  G/R, - HSM, - masses, - CVAT B  Musculoskeletal: M/S 5/5 throughout , Extremities without ischemic changes , palpable thrill in LUA, PSA in mid-segment  Laboratory See iStat  Medical Decision Making  HUXLEY SHURLEY is a 69 y.o. male who presents with: ESRD on HD with poor flow rates, likely moderate to large PSA in mid-segment.   The patient is scheduled for: L arm fistulogram, possible intervention I discussed with the patient the nature of angiographic procedures, especially the limited patencies of any endovascular intervention.  The patient is aware of that the risks of an angiographic procedure include but are not limited to: bleeding, infection, access site complications, renal failure, embolization, rupture of vessel, dissection, possible need for emergent surgical intervention, possible need for surgical procedures to treat the patient's pathology, and stroke and death.    The patient is aware of the risks and agrees to proceed.  Adele Barthel, MD [facility] Office: [phone] Pager: [phone]  01/15/2015, 8:18 AM

## 2015-01-15 NOTE — Discharge Instructions (Signed)
Fistulogram, Care After °Refer to this sheet in the next few weeks. These instructions provide you with information on caring for yourself after your procedure. Your health care provider may also give you more specific instructions. Your treatment has been planned according to current medical practices, but problems sometimes occur. Call your health care provider if you have any problems or questions after your procedure. °WHAT TO EXPECT AFTER THE PROCEDURE °After your procedure, it is typical to have the following: °· A small amount of discomfort in the area where the catheters were placed. °· A small amount of bruising around the fistula. °· Sleepiness and fatigue. °HOME CARE INSTRUCTIONS °· Rest at home for the day following your procedure. °· Do not drive or operate heavy machinery while taking pain medicine. °· Take medicines only as directed by your health care provider. °· Do not take baths, swim, or use a hot tub until your health care provider approves. You may shower 24 hours after the procedure or as directed by your health care provider. °· There are many different ways to close and cover an incision, including stitches, skin glue, and adhesive strips. Follow your health care provider's instructions on: °¨ Incision care. °¨ Bandage (dressing) changes and removal. °¨ Incision closure removal. °· Monitor your dialysis fistula carefully. °SEEK MEDICAL CARE IF: °· You have drainage, redness, swelling, or pain at your catheter site. °· You have a fever. °· You have chills. °SEEK IMMEDIATE MEDICAL CARE IF: °· You feel weak. °· You have trouble balancing. °· You have trouble moving your arms or legs. °· You have problems with your speech or vision. °· You can no longer feel a vibration or buzz when you put your fingers over your dialysis fistula. °· The limb that was used for the procedure: °¨ Swells. °¨ Is painful. °¨ Is cold. °¨ Is discolored, such as blue or pale white. °  °This information is not intended  to replace advice given to you by your health care provider. Make sure you discuss any questions you have with your health care provider. °  °Document Released: 07/22/2013 Document Reviewed: 07/22/2013 °Elsevier Interactive Patient Education ©2016 [employer] ° °

## 2015-01-15 NOTE — Telephone Encounter (Signed)
-----   Message from Mena Goes, RN sent at 01/15/2015 10:45 AM EDT ----- Regarding: schedule   ----- Message -----    From: Conrad Tuckerman, MD    Sent: 01/15/2015  10:07 AM      To: [facility] Charge Pool  CAID RADIN [id number] Oct 12, 1945   PROCEDURE: 1.  left brachiocephalic arteriovenous fistula cannulation under ultrasound guidance 2.  left arm fistulogram 3.  Venoplasty of cephalic vein (6 mm x 80 mm)  Follow-up: 3 months  Orders(s) for follow-up: L arm access duplex

## 2015-01-20 ENCOUNTER — Other Ambulatory Visit (HOSPITAL_COMMUNITY): Payer: Medicare Other

## 2015-01-20 ENCOUNTER — Ambulatory Visit (HOSPITAL_COMMUNITY): Admission: RE | Admit: 2015-01-20 | Payer: Medicare Other | Source: Ambulatory Visit

## 2015-01-27 ENCOUNTER — Ambulatory Visit (HOSPITAL_COMMUNITY)
Admission: RE | Admit: 2015-01-27 | Discharge: 2015-01-27 | Disposition: A | Discharge status: Home / Self Care | Payer: Medicare Other | Source: Ambulatory Visit | Attending: Family Medicine | Admitting: Family Medicine

## 2015-01-27 DIAGNOSIS — R109 Unspecified abdominal pain: Secondary | ICD-10-CM | POA: Insufficient documentation

## 2015-01-27 DIAGNOSIS — R948 Abnormal results of function studies of other organs and systems: Secondary | ICD-10-CM | POA: Diagnosis present

## 2015-01-31 ENCOUNTER — Encounter: Payer: Self-pay | Admitting: Family Medicine

## 2015-02-02 ENCOUNTER — Encounter: Payer: Self-pay | Admitting: Family

## 2015-02-03 ENCOUNTER — Encounter: Payer: Self-pay | Admitting: Family

## 2015-02-03 ENCOUNTER — Ambulatory Visit (INDEPENDENT_AMBULATORY_CARE_PROVIDER_SITE_OTHER): Payer: Medicare Other | Admitting: Family

## 2015-02-03 VITALS — BP 144/83 | HR 72 | Temp 98.4°F | Resp 16 | Ht 75.0 in | Wt 160.0 lb

## 2015-02-03 DIAGNOSIS — Z992 Dependence on renal dialysis: Secondary | ICD-10-CM

## 2015-02-03 DIAGNOSIS — I770 Arteriovenous fistula, acquired: Secondary | ICD-10-CM

## 2015-02-03 DIAGNOSIS — Z4802 Encounter for removal of sutures: Secondary | ICD-10-CM

## 2015-02-03 DIAGNOSIS — N186 End stage renal disease: Secondary | ICD-10-CM

## 2015-02-03 NOTE — Progress Notes (Signed)
Established Dialysis Access  History of Present Illness  [name] is a 69 y.o. (June 08, 1945) male patient of Dr. [name] who is s/p EVAR (01/16/2008 using a Cook Zenith device). He also has a left upper arm AV fistula that was placed 07/07/14 by [name]. Wife states she took pt to the ED at [facility] in August 2016 due to bleeding from left upper arm AVF; the AVF has been used since July 2016, wife states the temporary HD catheter on the right side of his chest was removed in August or September 2016.  On 01/15/15 Dr. [name] performed a fistula gram with the following findings: 1. Patent fistula with proximal stenosis (50%) with distal swing-segment stenosis (50-75%): <30% residual in proximal 75%, distal fistula stenosis non-responsive to venoplasty 2. Widely patent anastomosis 3. Patent central venous structures  The patient returns today at HD center request to evaluate visible suture at the fistula gram cannulation site at the AVF; wife states the HD center personnel expressed concern that the skin may be too thin at part pf the AVF.  The patient denies steal symptoms in his left hand.   The patient has no back or abdominal pain. He denies claudication pain in legs, denies non-healing wounds.  He had an MI in July 2013 and completed cardiac rehabilitation. A coronary stent was placed. He reports several strokes and does not know when his last stroke was; manifested as some degree of loss of vision and trouble speaking, denies hemiplegia. He states he quit ETOH use in 2015, states he stopped that for now, documented 28 shots of liquor/week in the past. He reports feeling off balance at times, the reason he uses walker. Noted that carotid Duplex done January, 2014 shows <40% bilateral ICA stenosis.  [name] follows pt for history strokes; wife states pt had a series of micro bleeds in his brain in March of this year.  He takes a daily statin and ASA, no other  antiplatelet nor anticoagulant medications.    Pt Diabetic: No Pt smoker: former smoker (3/4 ppd x 50+ yrs, quit December 2015)  Dr. [name] reviewed pt's CT angiogram at pt's February 2015 visit. This showed a stable aneurysm sac. Diameter measurements were 4.1 x 2.8. There was no evidence of endoleak. There had been a slight increase in the size of the right common iliac artery. This had increased from 2.3 up to 2.6 when compared to his study in June of 2010 No evidence of complicating features on CT scan.Will need to pay attention to the dilated right common iliac artery on future studies.  Dr. [name] scheduled the patient to come back in one year with an abdominal ultrasound   Past Medical History  Diagnosis Date  . Hypertension   . COPD (chronic obstructive pulmonary disease) ([place])   . Hyperlipidemia   . Cardiomyopathy     EF of 30% per echo in June of 2012; admitted with congestive heart failure; nonobstructive CAD on cath in 08/2010  . History of noncompliance with medical treatment   . Cerebrovascular disease 2011    CVA  in 02/2010-only deficit is decreased vision in  right eye  . Alcohol abuse   . Tobacco abuse     40 pack years  . Abdominal aortic aneurysm ([place])     Stent graft repair  . Chronic systolic CHF (congestive heart failure) ([place])   . Leg pain   . CKD (chronic kidney disease) stage 3, GFR 30-59 ml/min  creatinine-1.7 in 08/2010  . CAD (coronary artery disease)   . Myocardial infarction acute ([place]) 10/01/11  . CKD (chronic kidney disease), stage III 12/06/2012  . Effusion of right knee joint 12/06/2012  . Anemia   . Atrial fibrillation ([place])   . Ataxic gait   . Weakness generalized   . Frequent falls   . Forgetfulness   . Stroke [facility]) 2011    decreased vision of right eye    Most recent 05/23/14 - bleed - has a drag to his left leg  . Depression   . Anxiety     Takes Citalopram  . Arthritis     knees  . Renal insufficiency     Social  History Social History  Substance Use Topics  . Smoking status: Former Smoker -- 0.50 packs/day for 50 years    Types: Cigarettes    Start date: 06/25/1957    Quit date: 04/04/2014  . Smokeless tobacco: Never Used  . Alcohol Use: No    Family History Family History  Problem Relation Age of Onset  . Colon cancer Neg Hx   . Anesthesia problems Neg Hx   . Hypotension Neg Hx   . Malignant hyperthermia Neg Hx   . Pseudochol deficiency Neg Hx   . Cancer Mother     Gall Bladder  . Heart disease Mother     before age 68  . Hyperlipidemia Mother   . Hypertension Mother   . Asthma Mother   . Cancer Sister     Cervical    Surgical History Past Surgical History  Procedure Laterality Date  . Colonoscopy w/ polypectomy  2006    Dr. Tamala Julian: anal fissure, sessile adenomatous polyp, diverticulosis  . Esophagogastroduodenoscopy  11/15/2010    Procedure: ESOPHAGOGASTRODUODENOSCOPY (EGD);  Surgeon: Dorothyann Peng, MD;  Location: [facility];  Service: Endoscopy;  Laterality: N/A;  with propofol sedation; procedure start @ 0813  . Flexible sigmoidoscopy  11/15/2010    Procedure: FLEXIBLE SIGMOIDOSCOPY;  Surgeon: Dorothyann Peng, MD;  Location: [facility];  Service: Endoscopy;  Laterality: N/A;  with propofol sedation; ended @ 0808  . Polypectomy  12/13/2010    Procedure: POLYPECTOMY;  Surgeon: Dorothyann Peng, MD;  Location: [facility];  Service: Endoscopy;  Laterality: N/A;  right colon, cecal, transverse, and sigmoid polypectomy  . Cardiac catheterization  10/04/11    Normal left main, 95% pLAD stenosis with ulceration s/p successful BMS placement, 30% segmental plaque beyond this, dLAD after D2 with 50% plaquing, then focal 70% lesion, 80% focal short D2 stenosis, 30% pOM2 lesion, 30-40% mOM3 stenosis, Shephard's crook region of RCA with 50% lesion, mRCA 50-60% lesion and mild dRCA irregularities.   . Coronary stent placement  10/04/11  . Abdominal aortic aneurysm repair w/ endoluminal graft      01/16/2008   . Left heart catheterization with coronary angiogram N/A 10/04/2011    Procedure: LEFT HEART CATHETERIZATION WITH CORONARY ANGIOGRAM;  Surgeon: Hillary Bow, MD;  Location: [facility] CATH LAB;  Service: Cardiovascular;  Laterality: N/A;  . Av fistula placement Left 07/07/2014    Procedure: Creation of Left Arm ARTERIOVENOUS Fistula;  Surgeon: Rosetta Posner, MD;  Location: [place];  Service: Vascular;  Laterality: Left;  . Insertion of dialysis catheter Right 08/05/2014    Procedure: INSERTION OF DIALYSIS CATHETER Right internal Jugular;  Surgeon: Conrad , MD;  Location: [place];  Service: Vascular;  Laterality: Right;  . Port-a-cath removal    . Peripheral vascular catheterization Left  01/15/2015    Procedure: Fistulagram;  Surgeon: Conrad [place], MD;  Location: [place] CV LAB;  Service: Cardiovascular;  Laterality: Left;  arm  . Peripheral vascular catheterization Left 01/15/2015    Procedure: Peripheral Vascular Intervention;  Surgeon: Conrad [place], MD;  Location: [place] CV LAB;  Service: Cardiovascular;  Laterality: Left;  ARM VEINOUS PTA    No Known Allergies  Current Outpatient Prescriptions  Medication Sig Dispense Refill  . aspirin EC 81 MG tablet Take 81 mg by mouth daily.    Marland Kitchen atorvastatin (LIPITOR) 80 MG tablet TAKE 1 TABLET (80 MG TOTAL) BY MOUTH DAILY AT 6 PM. 90 tablet 3  . b complex-vitamin c-folic acid (NEPHRO-VITE) 0.8 MG TABS tablet Take 1 tablet by mouth daily.  11  . carvedilol (COREG) 3.125 MG tablet Take 1 tablet (3.125 mg total) by mouth 2 (two) times daily. 60 tablet 6  . citalopram (CELEXA) 10 MG tablet TAKE 1 TABLET BY MOUTH EVERY DAY 30 tablet 2  . lidocaine-prilocaine (EMLA) cream Apply 1 application topically as needed.  6  . pantoprazole (PROTONIX) 40 MG tablet Take 1 tablet (40 mg total) by mouth daily. 30 tablet 11  . polyethylene glycol (MIRALAX / GLYCOLAX) packet Take 17 g by mouth daily. (Patient taking differently: Take 17 g by mouth daily as needed for  mild constipation. ) 14 each 0  . VOLTAREN 1 % GEL Apply 4 g topically daily as needed (for knee pain). Bilateral Knee  1  . ZETIA 10 MG tablet TAKE 1 TABLET (10 MG TOTAL) BY MOUTH DAILY. 30 tablet 6  . [DISCONTINUED] metoprolol (TOPROL-XL) 50 MG 24 hr tablet Take 50 mg by mouth daily.      . [DISCONTINUED] niacin (NIASPAN) 500 MG CR tablet Take 500 mg by mouth at bedtime.      . [DISCONTINUED] omeprazole (PRILOSEC) 20 MG capsule 1 po every morning 30 capsule 5   No current facility-administered medications for this visit.     REVIEW OF SYSTEMS: see HPI for pertinent positives and negatives    PHYSICAL EXAMINATION:  Filed Vitals:   02/03/15 1403 02/03/15 1411  BP: 155/84 144/83  Pulse: 66 72  Temp: 98.4 F (36.9 C)   TempSrc: Oral   Resp: 16   Height: 6\' 3"  (1.905 m)   Weight: 160 lb (72.576 kg)   SpO2: 100%    Body mass index is 20 kg/(m^2).  General: The patient appears their stated age.  He uses a rolling walker to walk. HEENT:  No gross abnormalities Pulmonary: Respirations are non-labored Musculoskeletal: There are no major deformities.   Neurologic: No focal weakness or paresthesias are detected, Skin: There are no ulcer or rashes. Psychiatric: The patient has normal affect. Cardiovascular: There is a regular rate and rhythm. Left arm AVF with palpable thrill, no thinning area noted over AVF, no bleeding, no aneurysmal dilatation. One suture apparent. No drainage, left radial pulse is 2+ palpable.    Medical Decision Making  BRAE GODOWN is a 69 y.o. male who presents with well functioning left arm AV fistula, no evidence of thinning tissue over the AVF, no abscess at the single suture which was the site of the fistula gram cannulation on 01/15/15.  Monocryl suture clipped and removed. [name] spoke with pt and wife and examined pt's left arm AVF.  Follow up as needed re AVF. He is already scheduled for follow up re his EVAR surveillance.  Cardarius Senat, Sharmon Leyden, RN, MSN, FNP-C  [facility] Office: [phone]  02/03/2015, 2:16 PM  Clinic MD: Early

## 2015-02-03 NOTE — Progress Notes (Signed)
Filed Vitals:   02/03/15 1403 02/03/15 1411  BP: 155/84 144/83  Pulse: 66 72  Temp: 98.4 F (36.9 C)   TempSrc: Oral   Resp: 16   Height: 6\' 3"  (1.905 m)   Weight: 160 lb (72.576 kg)   SpO2: 100%

## 2015-02-09 ENCOUNTER — Observation Stay (HOSPITAL_COMMUNITY)
Admission: EM | Admit: 2015-02-09 | Discharge: 2015-02-14 | Disposition: A | Discharge status: Home / Self Care | Payer: Medicare Other | Attending: Family Medicine | Admitting: Family Medicine

## 2015-02-09 ENCOUNTER — Emergency Department (HOSPITAL_COMMUNITY): Payer: Medicare Other

## 2015-02-09 ENCOUNTER — Encounter (HOSPITAL_COMMUNITY): Payer: Self-pay | Admitting: Emergency Medicine

## 2015-02-09 DIAGNOSIS — D631 Anemia in chronic kidney disease: Secondary | ICD-10-CM | POA: Diagnosis not present

## 2015-02-09 DIAGNOSIS — Z87891 Personal history of nicotine dependence: Secondary | ICD-10-CM | POA: Diagnosis not present

## 2015-02-09 DIAGNOSIS — I951 Orthostatic hypotension: Secondary | ICD-10-CM

## 2015-02-09 DIAGNOSIS — Z808 Family history of malignant neoplasm of other organs or systems: Secondary | ICD-10-CM | POA: Diagnosis not present

## 2015-02-09 DIAGNOSIS — E785 Hyperlipidemia, unspecified: Secondary | ICD-10-CM | POA: Diagnosis not present

## 2015-02-09 DIAGNOSIS — Z992 Dependence on renal dialysis: Secondary | ICD-10-CM

## 2015-02-09 DIAGNOSIS — J449 Chronic obstructive pulmonary disease, unspecified: Secondary | ICD-10-CM | POA: Diagnosis not present

## 2015-02-09 DIAGNOSIS — I959 Hypotension, unspecified: Secondary | ICD-10-CM

## 2015-02-09 DIAGNOSIS — I5020 Unspecified systolic (congestive) heart failure: Secondary | ICD-10-CM | POA: Diagnosis not present

## 2015-02-09 DIAGNOSIS — N186 End stage renal disease: Secondary | ICD-10-CM

## 2015-02-09 DIAGNOSIS — Z8673 Personal history of transient ischemic attack (TIA), and cerebral infarction without residual deficits: Secondary | ICD-10-CM | POA: Diagnosis not present

## 2015-02-09 DIAGNOSIS — Z7982 Long term (current) use of aspirin: Secondary | ICD-10-CM | POA: Diagnosis not present

## 2015-02-09 DIAGNOSIS — R609 Edema, unspecified: Secondary | ICD-10-CM

## 2015-02-09 DIAGNOSIS — I132 Hypertensive heart and chronic kidney disease with heart failure and with stage 5 chronic kidney disease, or end stage renal disease: Secondary | ICD-10-CM | POA: Diagnosis not present

## 2015-02-09 DIAGNOSIS — T82838A Hemorrhage of vascular prosthetic devices, implants and grafts, initial encounter: Secondary | ICD-10-CM | POA: Diagnosis not present

## 2015-02-09 DIAGNOSIS — D638 Anemia in other chronic diseases classified elsewhere: Secondary | ICD-10-CM

## 2015-02-09 DIAGNOSIS — S40022A Contusion of left upper arm, initial encounter: Secondary | ICD-10-CM

## 2015-02-09 DIAGNOSIS — Z79899 Other long term (current) drug therapy: Secondary | ICD-10-CM | POA: Insufficient documentation

## 2015-02-09 LAB — COMPREHENSIVE METABOLIC PANEL
ALK PHOS: 95 U/L (ref 38–126)
ALT: 15 U/L — AB (ref 17–63)
AST: 22 U/L (ref 15–41)
Albumin: 4.2 g/dL (ref 3.5–5.0)
Anion gap: 14 (ref 5–15)
BUN: 18 mg/dL (ref 6–20)
CALCIUM: 9.1 mg/dL (ref 8.9–10.3)
CHLORIDE: 94 mmol/L — AB (ref 101–111)
CO2: 31 mmol/L (ref 22–32)
CREATININE: 4.38 mg/dL — AB (ref 0.61–1.24)
GFR calc Af Amer: 14 mL/min — ABNORMAL LOW (ref >60)
GFR, EST NON AFRICAN AMERICAN: 12 mL/min — AB (ref >60)
Glucose, Bld: 174 mg/dL — ABNORMAL HIGH (ref 65–99)
Potassium: 3.9 mmol/L (ref 3.5–5.1)
SODIUM: 139 mmol/L (ref 135–145)
Total Bilirubin: 0.6 mg/dL (ref 0.3–1.2)
Total Protein: 8.3 g/dL — ABNORMAL HIGH (ref 6.5–8.1)

## 2015-02-09 LAB — CBC WITH DIFFERENTIAL/PLATELET
BASOS ABS: 0 10*3/uL (ref 0.0–0.1)
Basophils Relative: 0 %
EOS PCT: 1 %
Eosinophils Absolute: 0.1 10*3/uL (ref 0.0–0.7)
HCT: 35.1 % — ABNORMAL LOW (ref 39.0–52.0)
HEMOGLOBIN: 11.7 g/dL — AB (ref 13.0–17.0)
LYMPHS ABS: 0.8 10*3/uL (ref 0.7–4.0)
LYMPHS PCT: 14 %
MCH: 34.3 pg — ABNORMAL HIGH (ref 26.0–34.0)
MCHC: 33.3 g/dL (ref 30.0–36.0)
MCV: 102.9 fL — AB (ref 78.0–100.0)
Monocytes Absolute: 0.6 10*3/uL (ref 0.1–1.0)
Monocytes Relative: 10 %
NEUTROS PCT: 75 %
Neutro Abs: 4.6 10*3/uL (ref 1.7–7.7)
PLATELETS: 171 10*3/uL (ref 150–400)
RBC: 3.41 MIL/uL — AB (ref 4.22–5.81)
RDW: 16 % — ABNORMAL HIGH (ref 11.5–15.5)
WBC: 6.1 10*3/uL (ref 4.0–10.5)

## 2015-02-09 MED ORDER — CARVEDILOL 3.125 MG PO TABS
3.1250 mg | ORAL_TABLET | Freq: Two times a day (BID) | ORAL | Status: DC
  Administered 2015-02-10 – 2015-02-14 (×8): 3.125 mg via ORAL
  Filled 2015-02-09 (×10): qty 1

## 2015-02-09 MED ORDER — SODIUM CHLORIDE 0.9 % IV SOLN: INTRAVENOUS | Status: DC

## 2015-02-09 MED ORDER — PANTOPRAZOLE SODIUM 40 MG PO TBEC
40.0000 mg | DELAYED_RELEASE_TABLET | Freq: Every day | ORAL | Status: DC
  Administered 2015-02-09 – 2015-02-14 (×5): 40 mg via ORAL
  Filled 2015-02-09 (×7): qty 1

## 2015-02-09 MED ORDER — ATORVASTATIN CALCIUM 40 MG PO TABS
80.0000 mg | ORAL_TABLET | Freq: Every day | ORAL | Status: DC
  Administered 2015-02-09 – 2015-02-12 (×4): 80 mg via ORAL
  Filled 2015-02-09 (×4): qty 2

## 2015-02-09 MED ORDER — SODIUM CHLORIDE 0.9 % IJ SOLN
3.0000 mL | Freq: Two times a day (BID) | INTRAMUSCULAR | Status: DC
  Administered 2015-02-10 – 2015-02-12 (×6): 3 mL via INTRAVENOUS

## 2015-02-09 MED ORDER — CITALOPRAM HYDROBROMIDE 20 MG PO TABS
10.0000 mg | ORAL_TABLET | Freq: Every day | ORAL | Status: DC
  Administered 2015-02-10 – 2015-02-12 (×3): 10 mg via ORAL
  Administered 2015-02-13 – 2015-02-14 (×2): via ORAL
  Filled 2015-02-09 (×6): qty 1

## 2015-02-09 MED ORDER — ASPIRIN EC 81 MG PO TBEC
81.0000 mg | DELAYED_RELEASE_TABLET | Freq: Every day | ORAL | Status: DC
  Administered 2015-02-09 – 2015-02-13 (×5): 81 mg via ORAL
  Filled 2015-02-09 (×6): qty 1

## 2015-02-09 MED ORDER — ONDANSETRON HCL 4 MG/2ML IJ SOLN: 4.0000 mg | Freq: Four times a day (QID) | INTRAMUSCULAR | Status: DC | PRN

## 2015-02-09 MED ORDER — ONDANSETRON HCL 4 MG PO TABS: 4.0000 mg | ORAL_TABLET | Freq: Four times a day (QID) | ORAL | Status: DC | PRN

## 2015-02-09 MED ORDER — POLYETHYLENE GLYCOL 3350 17 G PO PACK
17.0000 g | PACK | Freq: Every day | ORAL | Status: DC | PRN
  Administered 2015-02-14: 17 g via ORAL

## 2015-02-09 MED ORDER — EZETIMIBE 10 MG PO TABS
10.0000 mg | ORAL_TABLET | Freq: Every day | ORAL | Status: DC
  Administered 2015-02-09 – 2015-02-14 (×5): 10 mg via ORAL
  Filled 2015-02-09 (×6): qty 1

## 2015-02-09 MED ORDER — SODIUM CHLORIDE 0.9 % IV BOLUS (SEPSIS)
250.0000 mL | Freq: Once | INTRAVENOUS | Status: AC
  Administered 2015-02-09: 250 mL via INTRAVENOUS

## 2015-02-09 MED ORDER — ENOXAPARIN SODIUM 30 MG/0.3ML ~~LOC~~ SOLN: 30.0000 mg | SUBCUTANEOUS | Status: DC

## 2015-02-09 MED ORDER — RENA-VITE PO TABS
1.0000 | ORAL_TABLET | Freq: Every day | ORAL | Status: DC
  Administered 2015-02-09 – 2015-02-13 (×5): 1 via ORAL
  Filled 2015-02-09 (×5): qty 1

## 2015-02-09 NOTE — ED Provider Notes (Addendum)
CSN: [id number]     Arrival date & time 02/09/15  1719 History   First MD Initiated Contact with Patient 02/09/15 1756     Chief Complaint  Patient presents with  . Hypotension     (Consider location/radiation/quality/duration/timing/severity/associated sxs/prior Treatment) The history is provided by the patient and the spouse.   69 year old male dialysis patient since the spring. Normally dialyzed Monday Wednesday and Friday. Patient was dialyzed as usual today. Patient felt fine this morning. Patient normally feels fatigue after dialysis. However today when they stood him up his blood pressures dropped down into the 50s and he got diaphoretic. Reportedly blood pressures were remaining in the 50s yet patient was discharged to the custody of his wife and told to come here for further evaluation he came by POV. In the car patient started to feel better. Upon arrival here blood pressures were 125 but he was laying down. A shunt denied any complaints other than his usual feeling fatigued postdialysis denied any chest pain headache nausea vomiting or diarrhea shortness of breath. Patient stated that he felt much better and felt fine.  Past Medical History  Diagnosis Date  . Hypertension   . COPD (chronic obstructive pulmonary disease) ([place])   . Hyperlipidemia   . Cardiomyopathy     EF of 30% per echo in June of 2012; admitted with congestive heart failure; nonobstructive CAD on cath in 08/2010  . History of noncompliance with medical treatment   . Cerebrovascular disease 2011    CVA  in 02/2010-only deficit is decreased vision in  right eye  . Alcohol abuse   . Tobacco abuse     40 pack years  . Abdominal aortic aneurysm ([place])     Stent graft repair  . Chronic systolic CHF (congestive heart failure) ([place])   . Leg pain   . CKD (chronic kidney disease) stage 3, GFR 30-59 ml/min     creatinine-1.7 in 08/2010  . CAD (coronary artery disease)   . Myocardial infarction acute ([place]) 10/01/11  .  CKD (chronic kidney disease), stage III 12/06/2012  . Effusion of right knee joint 12/06/2012  . Anemia   . Atrial fibrillation ([place])   . Ataxic gait   . Weakness generalized   . Frequent falls   . Forgetfulness   . Stroke [facility]) 2011    decreased vision of right eye    Most recent 05/23/14 - bleed - has a drag to his left leg  . Depression   . Anxiety     Takes Citalopram  . Arthritis     knees  . Renal insufficiency    Past Surgical History  Procedure Laterality Date  . Colonoscopy w/ polypectomy  2006    Dr. Tamala Julian: anal fissure, sessile adenomatous polyp, diverticulosis  . Esophagogastroduodenoscopy  11/15/2010    Procedure: ESOPHAGOGASTRODUODENOSCOPY (EGD);  Surgeon: Dorothyann Peng, MD;  Location: [facility];  Service: Endoscopy;  Laterality: N/A;  with propofol sedation; procedure start @ 0813  . Flexible sigmoidoscopy  11/15/2010    Procedure: FLEXIBLE SIGMOIDOSCOPY;  Surgeon: Dorothyann Peng, MD;  Location: [facility];  Service: Endoscopy;  Laterality: N/A;  with propofol sedation; ended @ 0808  . Polypectomy  12/13/2010    Procedure: POLYPECTOMY;  Surgeon: Dorothyann Peng, MD;  Location: [facility];  Service: Endoscopy;  Laterality: N/A;  right colon, cecal, transverse, and sigmoid polypectomy  . Cardiac catheterization  10/04/11    Normal left main, 95% pLAD stenosis with ulceration s/p successful BMS placement,  30% segmental plaque beyond this, dLAD after D2 with 50% plaquing, then focal 70% lesion, 80% focal short D2 stenosis, 30% pOM2 lesion, 30-40% mOM3 stenosis, Shephard's crook region of RCA with 50% lesion, mRCA 50-60% lesion and mild dRCA irregularities.   . Coronary stent placement  10/04/11  . Abdominal aortic aneurysm repair w/ endoluminal graft      01/16/2008  . Left heart catheterization with coronary angiogram N/A 10/04/2011    Procedure: LEFT HEART CATHETERIZATION WITH CORONARY ANGIOGRAM;  Surgeon: Hillary Bow, MD;  Location: [facility] CATH LAB;  Service: Cardiovascular;  Laterality:  N/A;  . Av fistula placement Left 07/07/2014    Procedure: Creation of Left Arm ARTERIOVENOUS Fistula;  Surgeon: Rosetta Posner, MD;  Location: [place];  Service: Vascular;  Laterality: Left;  . Insertion of dialysis catheter Right 08/05/2014    Procedure: INSERTION OF DIALYSIS CATHETER Right internal Jugular;  Surgeon: Conrad Weidman, MD;  Location: [place];  Service: Vascular;  Laterality: Right;  . Port-a-cath removal    . Peripheral vascular catheterization Left 01/15/2015    Procedure: Fistulagram;  Surgeon: Conrad [place], MD;  Location: [place] CV LAB;  Service: Cardiovascular;  Laterality: Left;  arm  . Peripheral vascular catheterization Left 01/15/2015    Procedure: Peripheral Vascular Intervention;  Surgeon: Conrad , MD;  Location: [place] CV LAB;  Service: Cardiovascular;  Laterality: Left;  ARM VEINOUS PTA   Family History  Problem Relation Age of Onset  . Colon cancer Neg Hx   . Anesthesia problems Neg Hx   . Hypotension Neg Hx   . Malignant hyperthermia Neg Hx   . Pseudochol deficiency Neg Hx   . Cancer Mother     Gall Bladder  . Heart disease Mother     before age 80  . Hyperlipidemia Mother   . Hypertension Mother   . Asthma Mother   . Cancer Sister     Cervical   Social History  Substance Use Topics  . Smoking status: Former Smoker -- 0.50 packs/day for 50 years    Types: Cigarettes    Start date: 06/25/1957    Quit date: 04/04/2014  . Smokeless tobacco: Never Used  . Alcohol Use: No    Review of Systems  Constitutional: Positive for diaphoresis and fatigue.  HENT: Negative for congestion.   Eyes: Negative for visual disturbance.  Respiratory: Negative for shortness of breath.   Cardiovascular: Negative for chest pain.  Gastrointestinal: Negative for nausea, vomiting, abdominal pain and diarrhea.  Musculoskeletal: Negative for back pain and neck pain.  Skin: Negative for rash.  Neurological: Positive for light-headedness.  Hematological:  Bruises/bleeds easily.  Psychiatric/Behavioral: Negative for confusion.      Allergies  Review of patient's allergies indicates no known allergies.  Home Medications   Prior to Admission medications   Medication Sig Start Date End Date Taking? Authorizing Provider  aspirin EC 81 MG tablet Take 81 mg by mouth daily.   Yes Historical Provider, MD  atorvastatin (LIPITOR) 80 MG tablet TAKE 1 TABLET (80 MG TOTAL) BY MOUTH DAILY AT 6 PM. 06/30/14  Yes Herminio Commons, MD  b complex-vitamin c-folic acid (NEPHRO-VITE) 0.8 MG TABS tablet Take 1 tablet by mouth daily. 12/15/14  Yes Historical Provider, MD  carvedilol (COREG) 3.125 MG tablet Take 1 tablet (3.125 mg total) by mouth 2 (two) times daily. Patient taking differently: Take 3.125 mg by mouth daily. **May take an additional tab as needed based on BP levels 04/17/14  Yes Herminio Commons, MD  citalopram (CELEXA) 10 MG tablet TAKE 1 TABLET BY MOUTH EVERY DAY 12/17/14  Yes Kathyrn Drown, MD  lidocaine-prilocaine (EMLA) cream Apply 1 application topically as needed. 12/04/14  Yes Historical Provider, MD  pantoprazole (PROTONIX) 40 MG tablet Take 1 tablet (40 mg total) by mouth daily. 02/19/14  Yes Danie Binder, MD  polyethylene glycol (MIRALAX / GLYCOLAX) packet Take 17 g by mouth daily. Patient taking differently: Take 17 g by mouth daily as needed for mild constipation.  11/29/13  Yes Kathie Dike, MD  VOLTAREN 1 % GEL Apply 4 g topically daily as needed (for knee pain). Bilateral Knee 12/07/13  Yes Historical Provider, MD  ZETIA 10 MG tablet TAKE 1 TABLET (10 MG TOTAL) BY MOUTH DAILY. 11/17/14  Yes Herminio Commons, MD   BP 144/85 mmHg  Pulse 79  Temp(Src) 97.8 F (36.6 C) (Oral)  Resp 18  Ht 6\' 3"  (1.905 m)  Wt 72.576 kg  BMI 20.00 kg/m2  SpO2 99% Physical Exam  Constitutional: He is oriented to person, place, and time. He appears well-developed and well-nourished. No distress.  HENT:  Head: Normocephalic and atraumatic.   Mouth/Throat: Oropharynx is clear and moist.  Eyes: Conjunctivae and EOM are normal. Pupils are equal, round, and reactive to light.  Neck: Normal range of motion.  Cardiovascular: Normal rate, regular rhythm and normal heart sounds.   No murmur heard. Pulmonary/Chest: Effort normal and breath sounds normal. No respiratory distress. He has no wheezes. He has no rales.  Abdominal: Soft. Bowel sounds are normal. There is no tenderness.  Musculoskeletal: Normal range of motion. He exhibits no edema.  Dialysis AV shunt left arm good thrill. No bleeding.  Neurological: He is alert and oriented to person, place, and time. No cranial nerve deficit. He exhibits normal muscle tone. Coordination normal.  Skin: Skin is warm. No rash noted.    ED Course  Procedures (including critical care time) Labs Review Labs Reviewed  CBC WITH DIFFERENTIAL/PLATELET - Abnormal; Notable for the following:    RBC 3.41 (*)    Hemoglobin 11.7 (*)    HCT 35.1 (*)    MCV 102.9 (*)    MCH 34.3 (*)    RDW 16.0 (*)    All other components within normal limits  COMPREHENSIVE METABOLIC PANEL - Abnormal; Notable for the following:    Chloride 94 (*)    Glucose, Bld 174 (*)    Creatinine, Ser 4.38 (*)    Total Protein 8.3 (*)    ALT 15 (*)    GFR calc non Af Amer 12 (*)    GFR calc Af Amer 14 (*)    All other components within normal limits  COMPREHENSIVE METABOLIC PANEL  CBC   Results for orders placed or performed during the hospital encounter of 02/09/15  CBC with Differential  Result Value Ref Range   WBC 6.1 4.0 - 10.5 K/uL   RBC 3.41 (L) 4.22 - 5.81 MIL/uL   Hemoglobin 11.7 (L) 13.0 - 17.0 g/dL   HCT 35.1 (L) 39.0 - 52.0 %   MCV 102.9 (H) 78.0 - 100.0 fL   MCH 34.3 (H) 26.0 - 34.0 pg   MCHC 33.3 30.0 - 36.0 g/dL   RDW 16.0 (H) 11.5 - 15.5 %   Platelets 171 150 - 400 K/uL   Neutrophils Relative % 75 %   Neutro Abs 4.6 1.7 - 7.7 K/uL   Lymphocytes Relative 14 %   Lymphs Abs  0.8 0.7 - 4.0 K/uL    Monocytes Relative 10 %   Monocytes Absolute 0.6 0.1 - 1.0 K/uL   Eosinophils Relative 1 %   Eosinophils Absolute 0.1 0.0 - 0.7 K/uL   Basophils Relative 0 %   Basophils Absolute 0.0 0.0 - 0.1 K/uL  Comprehensive metabolic panel  Result Value Ref Range   Sodium 139 135 - 145 mmol/L   Potassium 3.9 3.5 - 5.1 mmol/L   Chloride 94 (L) 101 - 111 mmol/L   CO2 31 22 - 32 mmol/L   Glucose, Bld 174 (H) 65 - 99 mg/dL   BUN 18 6 - 20 mg/dL   Creatinine, Ser 4.38 (H) 0.61 - 1.24 mg/dL   Calcium 9.1 8.9 - 10.3 mg/dL   Total Protein 8.3 (H) 6.5 - 8.1 g/dL   Albumin 4.2 3.5 - 5.0 g/dL   AST 22 15 - 41 U/L   ALT 15 (L) 17 - 63 U/L   Alkaline Phosphatase 95 38 - 126 U/L   Total Bilirubin 0.6 0.3 - 1.2 mg/dL   GFR calc non Af Amer 12 (L) >60 mL/min   GFR calc Af Amer 14 (L) >60 mL/min   Anion gap 14 5 - 15     Imaging Review Dg Chest Port 1 View  02/09/2015  CLINICAL DATA:  Syncope episodes today during dialysis. Intermittent dizziness. COPD. EXAM: PORTABLE CHEST 1 VIEW COMPARISON:  01/08/2015 FINDINGS: Upper normal heart size. No edema noted. There is some indistinct bandlike opacity at the right lung base probably reflecting mild atelectasis. The lungs appear otherwise clear. No pleural effusion. IMPRESSION: 1. Bandlike right infrahilar and right basilar opacity, probably from atelectasis. Otherwise, no significant abnormalities are observed. Electronically Signed   By: Van Clines M.D.   On: 02/09/2015 19:29   I have personally reviewed and evaluated these images and lab results as part of my medical decision-making.   EKG Interpretation   Date/Time:  Monday February 09 2015 20:23:55 EST Ventricular Rate:  74 PR Interval:  195 QRS Duration: 73 QT Interval:  428 QTC Calculation: 475 R Axis:   73 Text Interpretation:  Sinus rhythm Consider left atrial enlargement Left  ventricular hypertrophy Confirmed by Loye Reininger  MD, Madalen Gavin [phone]) on  02/09/2015 8:27:13 PM      MDM    Final diagnoses:  Orthostatic hypotension   The patient is a dialysis patient normally dialyzed on Monday Wednesdays and Fridays. Patient today had dialysis as scheduled however at the time of discharge that he noticed his blood pressure dropped significantly when he stood up. Patient's spouse stated that they went down to 10. Patient was told to be brought here by POV by the spouse. Patient apparently was diaphoretic at dialysis Center but on the way here was sitting in the vehicle started to improve. Patient upon arrival here had no significant complaints. Felt that he was his usual feeling status post dialysis. However only got him up to do orthostatic blood pressures were fine with sitting but when he stood up he drops pressure down into the upper 50s and had a syncopal episode. EKG without any acute changes.  On cardiac monitor no arrhythmias. On examination no significant findings. Patient will require gentle fluid hydration. Discussed with hospitalist about admission. Chest x-ray without acute findings electrolytes without significant abnormalities. No evidence of significant anemia.  Fredia Sorrow, MD 02/09/15 2012  Fredia Sorrow, MD 02/09/15 2027

## 2015-02-09 NOTE — ED Notes (Addendum)
Pt states that he just finished dialysis about an hour ago and they would not let him leave because he was dizzy and his bp was low. Pt states that he feels better than he did but still feels dizzy and weak.

## 2015-02-09 NOTE — H&P (Signed)
[facility] History and Physical  ROLLO UPPAL [id number] DOB: 1945/07/25 DOA: 02/09/2015  Referring physician: ER PCP: [name], MD   Chief Complaint: Hypotension  HPI: [name] is a 69 y.o. male  This is a 69 year old man who has end-stage renal disease and is on hemodialysis and gets hemodialysis on Mondays, Wednesdays and Fridays. He has been having hemodialysis for the last 6 months. Today at hemodialysis, he apparently became quite hypotensive with systolic blood pressure in the 50s. He was told to come to the emergency room. He came to the emergency room and his initial blood pressure was acceptable lying down but when he stood up his blood pressure dropped significantly to the point where he actually lost consciousness. He denies any chest pain, palpitations or limb weakness. It is felt that perhaps he had too much volume drawn off during dialysis today. He is now being admitted for further management.   Review of Systems:  Apart from symptoms above, all systems are negative.  Past Medical History  Diagnosis Date  . Hypertension   . COPD (chronic obstructive pulmonary disease) ([place])   . Hyperlipidemia   . Cardiomyopathy     EF of 30% per echo in June of 2012; admitted with congestive heart failure; nonobstructive CAD on cath in 08/2010  . History of noncompliance with medical treatment   . Cerebrovascular disease 2011    CVA  in 02/2010-only deficit is decreased vision in  right eye  . Alcohol abuse   . Tobacco abuse     40 pack years  . Abdominal aortic aneurysm ([place])     Stent graft repair  . Chronic systolic CHF (congestive heart failure) ([place])   . Leg pain   . CKD (chronic kidney disease) stage 3, GFR 30-59 ml/min     creatinine-1.7 in 08/2010  . CAD (coronary artery disease)   . Myocardial infarction acute ([place]) 10/01/11  . CKD (chronic kidney disease), stage III 12/06/2012  . Effusion of right knee joint 12/06/2012  . Anemia   . Atrial  fibrillation ([place])   . Ataxic gait   . Weakness generalized   . Frequent falls   . Forgetfulness   . Stroke [facility]) 2011    decreased vision of right eye    Most recent 05/23/14 - bleed - has a drag to his left leg  . Depression   . Anxiety     Takes Citalopram  . Arthritis     knees  . Renal insufficiency    Past Surgical History  Procedure Laterality Date  . Colonoscopy w/ polypectomy  2006    Dr. Tamala Julian: anal fissure, sessile adenomatous polyp, diverticulosis  . Esophagogastroduodenoscopy  11/15/2010    Procedure: ESOPHAGOGASTRODUODENOSCOPY (EGD);  Surgeon: Dorothyann Peng, MD;  Location: [facility];  Service: Endoscopy;  Laterality: N/A;  with propofol sedation; procedure start @ 0813  . Flexible sigmoidoscopy  11/15/2010    Procedure: FLEXIBLE SIGMOIDOSCOPY;  Surgeon: Dorothyann Peng, MD;  Location: [facility];  Service: Endoscopy;  Laterality: N/A;  with propofol sedation; ended @ 0808  . Polypectomy  12/13/2010    Procedure: POLYPECTOMY;  Surgeon: Dorothyann Peng, MD;  Location: [facility];  Service: Endoscopy;  Laterality: N/A;  right colon, cecal, transverse, and sigmoid polypectomy  . Cardiac catheterization  10/04/11    Normal left main, 95% pLAD stenosis with ulceration s/p successful BMS placement, 30% segmental plaque beyond this, dLAD after D2 with 50% plaquing, then focal 70% lesion, 80% focal  short D2 stenosis, 30% pOM2 lesion, 30-40% mOM3 stenosis, Shephard's crook region of RCA with 50% lesion, mRCA 50-60% lesion and mild dRCA irregularities.   . Coronary stent placement  10/04/11  . Abdominal aortic aneurysm repair w/ endoluminal graft      01/16/2008  . Left heart catheterization with coronary angiogram N/A 10/04/2011    Procedure: LEFT HEART CATHETERIZATION WITH CORONARY ANGIOGRAM;  Surgeon: Hillary Bow, MD;  Location[facility] C[facility] LAB;  Service: Cardiovascular;  Laterality: N/A;  . Av fistula placement Left 07/07/2014    Procedure: Creation of Left Arm ARTERIOVENOUS Fistula;  Surgeon:  Rosetta Posner, MD;  Location: [place];  Service: Vascular;  Laterality: Left;  . Insertion of dialysis catheter Right 08/05/2014    Procedure: INSERTION OF DIALYSIS CATHETER Right internal Jugular;  Surgeon: Conrad Parkman, MD;  Location: [place];  Service: Vascular;  Laterality: Right;  . Port-a-cath removal    . Peripheral vascular catheterization Left 01/15/2015    Procedure: Fistulagram;  Surgeon: Conrad Colstrip, MD;  Location: [place] CV LAB;  Service: Cardiovascular;  Laterality: Left;  arm  . Peripheral vascular catheterization Left 01/15/2015    Procedure: Peripheral Vascular Intervention;  Surgeon: Conrad Emerald Lakes, MD;  Location: [place] CV LAB;  Service: Cardiovascular;  Laterality: Left;  ARM VEINOUS PTA   Social History:  reports that he quit smoking about 10 months ago. His smoking use included Cigarettes. He started smoking about 57 years ago. He has a 25 pack-year smoking history. He has never used smokeless tobacco. He reports that he does not drink alcohol or use illicit drugs.  No Known Allergies  Family History  Problem Relation Age of Onset  . Colon cancer Neg Hx   . Anesthesia problems Neg Hx   . Hypotension Neg Hx   . Malignant hyperthermia Neg Hx   . Pseudochol deficiency Neg Hx   . Cancer Mother     Gall Bladder  . Heart disease Mother     before age 67  . Hyperlipidemia Mother   . Hypertension Mother   . Asthma Mother   . Cancer Sister     Cervical      Prior to Admission medications   Medication Sig Start Date End Date Taking? Authorizing Provider  aspirin EC 81 MG tablet Take 81 mg by mouth daily.   Yes Historical Provider, MD  atorvastatin (LIPITOR) 80 MG tablet TAKE 1 TABLET (80 MG TOTAL) BY MOUTH DAILY AT 6 PM. 06/30/14  Yes Herminio Commons, MD  b complex-vitamin c-folic acid (NEPHRO-VITE) 0.8 MG TABS tablet Take 1 tablet by mouth daily. 12/15/14  Yes Historical Provider, MD  carvedilol (COREG) 3.125 MG tablet Take 1 tablet (3.125 mg total) by mouth 2  (two) times daily. Patient taking differently: Take 3.125 mg by mouth daily. **May take an additional tab as needed based on BP levels 04/17/14  Yes Herminio Commons, MD  citalopram (CELEXA) 10 MG tablet TAKE 1 TABLET BY MOUTH EVERY DAY 12/17/14  Yes Kathyrn Drown, MD  lidocaine-prilocaine (EMLA) cream Apply 1 application topically as needed. 12/04/14  Yes Historical Provider, MD  pantoprazole (PROTONIX) 40 MG tablet Take 1 tablet (40 mg total) by mouth daily. 02/19/14  Yes Danie Binder, MD  polyethylene glycol (MIRALAX / GLYCOLAX) packet Take 17 g by mouth daily. Patient taking differently: Take 17 g by mouth daily as needed for mild constipation.  11/29/13  Yes Kathie Dike, MD  VOLTAREN 1 % GEL Apply  4 g topically daily as needed (for knee pain). Bilateral Knee 12/07/13  Yes Historical Provider, MD  ZETIA 10 MG tablet TAKE 1 TABLET (10 MG TOTAL) BY MOUTH DAILY. 11/17/14  Yes Herminio Commons, MD   Physical Exam: Filed Vitals:   02/09/15 1930 02/09/15 1942 02/09/15 1945 02/09/15 2000  BP: 130/79 130/79  144/85  Pulse: 80 76 82 79  Temp:      TempSrc:      Resp: 17 16 18 18   Height:      Weight:      SpO2: 100% 100% 100% 99%    Wt Readings from Last 3 Encounters:  02/09/15 72.576 kg (160 lb)  02/03/15 72.576 kg (160 lb)  01/15/15 72.576 kg (160 lb)    General:  Appears calm and comfortable. His blood pressure is stable at the present time. He is alert and orientated in time, place and person. Eyes: PERRL, normal lids, irises & conjunctiva ENT: grossly normal hearing, lips & tongue Neck: no LAD, masses or thyromegaly Cardiovascular: RRR, no m/r/g. No LE edema. Telemetry: SR, no arrhythmias  Respiratory: CTA bilaterally, no w/r/r. Normal respiratory effort. Abdomen: soft, ntnd Skin: no rash or induration seen on limited exam Musculoskeletal: grossly normal tone BUE/BLE Psychiatric: grossly normal mood and affect, speech fluent and appropriate Neurologic: grossly non-focal.           Labs on Admission:  Basic Metabolic Panel:  Recent Labs Lab 02/09/15 1825  NA 139  K 3.9  CL 94*  CO2 31  GLUCOSE 174*  BUN 18  CREATININE 4.38*  CALCIUM 9.1   Liver Function Tests:  Recent Labs Lab 02/09/15 1825  AST 22  ALT 15*  ALKPHOS 95  BILITOT 0.6  PROT 8.3*  ALBUMIN 4.2   No results for input(s): LIPASE, AMYLASE in the last 168 hours. No results for input(s): AMMONIA in the last 168 hours. CBC:  Recent Labs Lab 02/09/15 1825  WBC 6.1  NEUTROABS 4.6  HGB 11.7*  HCT 35.1*  MCV 102.9*  PLT 171   Cardiac Enzymes: No results for input(s): CKTOTAL, CKMB, CKMBINDEX, TROPONINI in the last 168 hours.  BNP (last 3 results)  Recent Labs  01/06/15 1135  BNP 188.0*    ProBNP (last 3 results) No results for input(s): PROBNP in the last 8760 hours.  CBG: No results for input(s): GLUCAP in the last 168 hours.  Radiological Exams on Admission: Dg Chest Port 1 View  02/09/2015  CLINICAL DATA:  Syncope episodes today during dialysis. Intermittent dizziness. COPD. EXAM: PORTABLE CHEST 1 VIEW COMPARISON:  01/08/2015 FINDINGS: Upper normal heart size. No edema noted. There is some indistinct bandlike opacity at the right lung base probably reflecting mild atelectasis. The lungs appear otherwise clear. No pleural effusion. IMPRESSION: 1. Bandlike right infrahilar and right basilar opacity, probably from atelectasis. Otherwise, no significant abnormalities are observed. Electronically Signed   By: Van Clines M.D.   On: 02/09/2015 19:29      Assessment/Plan   1. Hypotension. It appears that he perhaps had too much fluid drawn off during dialysis today. He will be given gentle IV fluids. Nephrology consultation in the morning. I anticipate that he will be well enough to be discharged home tomorrow. 2. End-stage renal disease on hemodialysis. 3. Coronary artery disease. This appears to be stable.  He'll be admitted to telemetry and monitor  closely. Further recommendations will depend on patient's hospital progress.   Code Status: Full code.   DVT Prophylaxis: Lovenox.  Family Communication: I discussed the plan with the patient at the bedside.   Disposition Plan: Home when medically stable.   Time spent: 45 minutes.  Doree Albee [facility] Pager [phone].

## 2015-02-09 NOTE — ED Notes (Signed)
During orthostatic VS. Patient stood for approximately 1 minute. Patient then became nauseous and dizzy. Patient slowly became less responsive and started to lower to bed. Guided patient back to bed with assistance. Patient became responsive when laid back in the bed. Family at bedside.

## 2015-02-10 ENCOUNTER — Ambulatory Visit: Payer: Medicare Other | Admitting: Family Medicine

## 2015-02-10 ENCOUNTER — Observation Stay (HOSPITAL_COMMUNITY): Payer: Medicare Other

## 2015-02-10 ENCOUNTER — Encounter (HOSPITAL_COMMUNITY): Payer: Self-pay | Admitting: *Deleted

## 2015-02-10 DIAGNOSIS — N186 End stage renal disease: Secondary | ICD-10-CM | POA: Diagnosis not present

## 2015-02-10 DIAGNOSIS — Z992 Dependence on renal dialysis: Secondary | ICD-10-CM | POA: Diagnosis not present

## 2015-02-10 DIAGNOSIS — I953 Hypotension of hemodialysis: Secondary | ICD-10-CM | POA: Diagnosis not present

## 2015-02-10 LAB — COMPREHENSIVE METABOLIC PANEL
ALT: 14 U/L — ABNORMAL LOW (ref 17–63)
ANION GAP: 11 (ref 5–15)
AST: 19 U/L (ref 15–41)
Albumin: 3.9 g/dL (ref 3.5–5.0)
Alkaline Phosphatase: 86 U/L (ref 38–126)
BUN: 27 mg/dL — AB (ref 6–20)
CHLORIDE: 98 mmol/L — AB (ref 101–111)
CO2: 31 mmol/L (ref 22–32)
Calcium: 8.8 mg/dL — ABNORMAL LOW (ref 8.9–10.3)
Creatinine, Ser: 5.8 mg/dL — ABNORMAL HIGH (ref 0.61–1.24)
GFR calc Af Amer: 10 mL/min — ABNORMAL LOW (ref >60)
GFR, EST NON AFRICAN AMERICAN: 9 mL/min — AB (ref >60)
Glucose, Bld: 94 mg/dL (ref 65–99)
POTASSIUM: 4 mmol/L (ref 3.5–5.1)
Sodium: 140 mmol/L (ref 135–145)
Total Bilirubin: 0.9 mg/dL (ref 0.3–1.2)
Total Protein: 7.5 g/dL (ref 6.5–8.1)

## 2015-02-10 LAB — CBC
HEMATOCRIT: 33.3 % — AB (ref 39.0–52.0)
HEMOGLOBIN: 10.8 g/dL — AB (ref 13.0–17.0)
MCH: 34 pg (ref 26.0–34.0)
MCHC: 32.4 g/dL (ref 30.0–36.0)
MCV: 104.7 fL — AB (ref 78.0–100.0)
PLATELETS: 160 10*3/uL (ref 150–400)
RBC: 3.18 MIL/uL — AB (ref 4.22–5.81)
RDW: 16.3 % — ABNORMAL HIGH (ref 11.5–15.5)
WBC: 6.7 10*3/uL (ref 4.0–10.5)

## 2015-02-10 MED ORDER — ACETAMINOPHEN 325 MG PO TABS
650.0000 mg | ORAL_TABLET | Freq: Four times a day (QID) | ORAL | Status: DC | PRN
  Administered 2015-02-10 – 2015-02-13 (×7): 650 mg via ORAL
  Administered 2015-02-13: 325 mg via ORAL
  Filled 2015-02-10 (×8): qty 2

## 2015-02-10 NOTE — Plan of Care (Signed)
Problem: Physical Regulation: Goal: Ability to maintain clinical measurements within normal limits will improve Outcome: Progressing See flowsheet.

## 2015-02-10 NOTE — Progress Notes (Signed)
[facility] PROGRESS NOTE  Greg ROBERG [id number] DOB: 1946-03-21 DOA: 02/09/2015 PCP: [name], MD  Assessment/Plan: Hypotension -Resolved. -Suspect related to excess volume removal with HD. -Will DC IVF.  RUE Edema/Pain -Right at fistula site. -very painful and is limiting his ROM. -Suspect this is either a DVT or maybe some infiltration from his fistula after his last HD. -Venous doppler requested. -Pain meds PRN.  ESRD -On HD. -Will request renal to follow.   Code Status: Full Code Family Communication: Wife at bedside updated on plan of care  Disposition Plan: Venous doppler, hope for DC home next 24 hours   Consultants:  Renal   Antibiotics:  None   Subjective: Not dizzy with ambulation. Significant pain of RUE, limited ROM  Objective: Filed Vitals:   02/09/15 2030 02/09/15 2130 02/10/15 0543 02/10/15 1402  BP: 136/83 139/90 144/79 142/84  Pulse: 72 90 84 84  Temp:  98.6 F (37 C) 98.4 F (36.9 C) 98.7 F (37.1 C)  TempSrc:  Oral Oral Oral  Resp: 15 16 16 18   Height:  6\' 3"  (1.905 m)    Weight:  70.353 kg (155 lb 1.6 oz)    SpO2: 100% 99% 100% 98%    Intake/Output Summary (Last 24 hours) at 02/10/15 1524 Last data filed at 02/10/15 1200  Gross per 24 hour  Intake    480 ml  Output      0 ml  Net    480 ml   Filed Weights   02/09/15 1727 02/09/15 2130  Weight: 72.576 kg (160 lb) 70.353 kg (155 lb 1.6 oz)    Exam:   General:  AA Ox3  Cardiovascular: RRR  Respiratory: CTA B  Abdomen: S/NT/ND/+BS  Extremities: no C/C/E , RUE with edema over fistula site  Neurologic:  Grossly intact and non-focal  Data Reviewed: Basic Metabolic Panel:  Recent Labs Lab 02/09/15 1825 02/10/15 0616  NA 139 140  K 3.9 4.0  CL 94* 98*  CO2 31 31  GLUCOSE 174* 94  BUN 18 27*  CREATININE 4.38* 5.80*  CALCIUM 9.1 8.8*   Liver Function Tests:  Recent Labs Lab 02/09/15 1825 02/10/15 0616  AST 22 19  ALT 15* 14*    ALKPHOS 95 86  BILITOT 0.6 0.9  PROT 8.3* 7.5  ALBUMIN 4.2 3.9   No results for input(s): LIPASE, AMYLASE in the last 168 hours. No results for input(s): AMMONIA in the last 168 hours. CBC:  Recent Labs Lab 02/09/15 1825 02/10/15 0616  WBC 6.1 6.7  NEUTROABS 4.6  --   HGB 11.7* 10.8*  HCT 35.1* 33.3*  MCV 102.9* 104.7*  PLT 171 160   Cardiac Enzymes: No results for input(s): CKTOTAL, CKMB, CKMBINDEX, TROPONINI in the last 168 hours. BNP (last 3 results)  Recent Labs  01/06/15 1135  BNP 188.0*    ProBNP (last 3 results) No results for input(s): PROBNP in the last 8760 hours.  CBG: No results for input(s): GLUCAP in the last 168 hours.  No results found for this or any previous visit (from the past 240 hour(s)).   Studies: Dg Chest Port 1 View  02/09/2015  CLINICAL DATA:  Syncope episodes today during dialysis. Intermittent dizziness. COPD. EXAM: PORTABLE CHEST 1 VIEW COMPARISON:  01/08/2015 FINDINGS: Upper normal heart size. No edema noted. There is some indistinct bandlike opacity at the right lung base probably reflecting mild atelectasis. The lungs appear otherwise clear. No pleural effusion. IMPRESSION: 1. Bandlike right infrahilar and right  basilar opacity, probably from atelectasis. Otherwise, no significant abnormalities are observed. Electronically Signed   By: Van Clines M.D.   On: 02/09/2015 19:29    Scheduled Meds: . aspirin EC  81 mg Oral Daily  . atorvastatin  80 mg Oral q1800  . carvedilol  3.125 mg Oral BID WC  . citalopram  10 mg Oral Daily  . ezetimibe  10 mg Oral Daily  . multivitamin  1 tablet Oral QHS  . pantoprazole  40 mg Oral Daily  . sodium chloride  3 mL Intravenous Q12H   Continuous Infusions:   Active Problems:   CAD (coronary artery disease)   Hypotension   End-stage renal disease on hemodialysis ([place])    Time spent: 25 minutes. Greater than 50% of this time was spent in direct contact with the patient coordinating  care.    Lelon Frohlich  [facility] Pager [phone]  If 7PM-7AM, please contact night-coverage at [email or web address], password [facility] 02/10/2015, 3:24 PM

## 2015-02-10 NOTE — Care Management Obs Status (Signed)
[place] NOTIFICATION   Patient Details  Name: [name] MRN: [id number] Date of Birth: 15-Sep-1945   Medicare Observation Status Notification Given:  Yes    Joylene Draft, RN 02/10/2015, 1:35 PM

## 2015-02-10 NOTE — Care Management Note (Signed)
Case Management Note  Patient Details  Name: [name] MRN: [id number] Date of Birth: [birth year]  Subjective/Objective:                  Pt admitted from home with hypotension. Pt lives with his wife and will return home at discharge. Pt uses a walker for prn use. Pt receives dialysis at [facility] in [place].  Action/Plan: No CM needs noted.  Expected Discharge Date:                  Expected Discharge Plan:  Home/Self Care  In-House Referral:  NA  Discharge planning Services  CM Consult  Post Acute Care Choice:  NA Choice offered to:  NA  DME Arranged:    DME Agency:     HH Arranged:    HH Agency:     Status of Service:  Completed, signed off  Medicare Important Message Given:    Date Medicare IM Given:    Medicare IM give by:    Date Additional Medicare IM Given:    Additional Medicare Important Message give by:     If discussed at [place] of Stay Meetings, dates discussed:    Additional Comments:  Joylene Draft, RN 02/10/2015, 1:50 PM

## 2015-02-11 ENCOUNTER — Other Ambulatory Visit: Payer: Self-pay

## 2015-02-11 DIAGNOSIS — S40022A Contusion of left upper arm, initial encounter: Secondary | ICD-10-CM | POA: Diagnosis not present

## 2015-02-11 DIAGNOSIS — I951 Orthostatic hypotension: Secondary | ICD-10-CM | POA: Diagnosis not present

## 2015-02-11 DIAGNOSIS — D638 Anemia in other chronic diseases classified elsewhere: Secondary | ICD-10-CM | POA: Diagnosis not present

## 2015-02-11 DIAGNOSIS — N186 End stage renal disease: Secondary | ICD-10-CM | POA: Diagnosis not present

## 2015-02-11 DIAGNOSIS — Z992 Dependence on renal dialysis: Secondary | ICD-10-CM | POA: Diagnosis not present

## 2015-02-11 LAB — CBC
HEMATOCRIT: 26.9 % — AB (ref 39.0–52.0)
Hemoglobin: 9 g/dL — ABNORMAL LOW (ref 13.0–17.0)
MCH: 34.5 pg — AB (ref 26.0–34.0)
MCHC: 33.5 g/dL (ref 30.0–36.0)
MCV: 103.1 fL — AB (ref 78.0–100.0)
Platelets: 150 10*3/uL (ref 150–400)
RBC: 2.61 MIL/uL — ABNORMAL LOW (ref 4.22–5.81)
RDW: 15.8 % — AB (ref 11.5–15.5)
WBC: 7.4 10*3/uL (ref 4.0–10.5)

## 2015-02-11 LAB — BASIC METABOLIC PANEL
ANION GAP: 11 (ref 5–15)
BUN: 44 mg/dL — AB (ref 6–20)
CALCIUM: 8.6 mg/dL — AB (ref 8.9–10.3)
CO2: 29 mmol/L (ref 22–32)
CREATININE: 8.28 mg/dL — AB (ref 0.61–1.24)
Chloride: 97 mmol/L — ABNORMAL LOW (ref 101–111)
GFR calc Af Amer: 7 mL/min — ABNORMAL LOW (ref >60)
GFR calc non Af Amer: 6 mL/min — ABNORMAL LOW (ref >60)
GLUCOSE: 113 mg/dL — AB (ref 65–99)
Potassium: 4.2 mmol/L (ref 3.5–5.1)
Sodium: 137 mmol/L (ref 135–145)

## 2015-02-11 NOTE — Progress Notes (Addendum)
PROGRESS NOTE  [name] [id number] DOB: 1945/05/20 DOA: 02/09/2015 PCP: [name], MD  Summary: 77yom PMH ESRD on HD MWF who had dizziness and low BP at outpatient HD 11/21. Was sent to ED where was found to have profound orthostasis resulting in syncope in ED. Admitted for orthostasis thought secondary to too much volume removed.   Assessment/Plan: 1. Orthostatic hypotension, resolved, thought related to excess volume removal with HD 2. LUE arm pain near fistula site, edema secondary to large hematoma resulting in brachial vein compression, AV fistula patent on u/s. 3. ESRD MWF at [facility] in [place]. 4. Anemia of CKD, stable.  5. COPD 6. Chronic systolic CHF 7. PMH stroke, AAA s/p AAA repair, CKD stage III, atrial fibarillation   Hypotension resolved. Suspect related to volume removal and blood loss.  Check CBC and BMP now. Will discuss with nephrology.  Discussed u/s and case with Dr. [name] [facility], no need for intervention given normal perfusion--if labs ok (potassium, etc) then recs waiting for next HD 11/25; will need temporary HD catheter while hematoma resolves. Dr. [name] will post for early AM 11/25 at [facility] with plan for HD at [facility] in [place] thereafter.  Come to short stay at [facility] at 6 AM. NPO after midnight.  ADDENDUM D/w Dr. Theador Hawthorne, rec remain inhouse until HD or 11/25. IR unable to place line today, will be available 11/24 on urgent basis Will reeval in AM to see if HD needed. If not, will plan d/c 11/25 with line placement and HD. If so, will plan line placement 11/24. Potassium normal, no evidence of volume overload.  Code Status: full code DVT prophylaxis: SCDs Family Communication: wife at bedside, daughter at bedside Disposition Plan: home  Murray Hodgkins, MD  [facility]  Pager [phone] If 7PM-7AM, please contact night-coverage at [email or web address], password [facility] 02/11/2015, 12:06 PM     Consultants:    Procedures:    Antibiotics:    HPI/Subjective: Feeling ok, eating ok. Reports pain in left upper arm, edema from shoulder to bicep, now down to upper forearm.  Objective: Filed Vitals:   02/10/15 0543 02/10/15 1402 02/10/15 2122 02/11/15 0605  BP: 144/79 142/84 129/79 132/69  Pulse: 84 84 71 79  Temp: 98.4 F (36.9 C) 98.7 F (37.1 C) 98.5 F (36.9 C) 98.2 F (36.8 C)  TempSrc: Oral Oral Oral Oral  Resp: 16 18 18 18   Height:      Weight:      SpO2: 100% 98% 98% 97%    Intake/Output Summary (Last 24 hours) at 02/11/15 1206 Last data filed at 02/11/15 0830  Gross per 24 hour  Intake    240 ml  Output    100 ml  Net    140 ml     Filed Weights   02/09/15 1727 02/09/15 2130  Weight: 72.576 kg (160 lb) 70.353 kg (155 lb 1.6 oz)    Exam:    General: Appears calm and comfortable Cardiovascular: RRR, no m/r/g. N Telemetry: SR, no arrhythmias  Respiratory: CTA bilaterally, no w/r/r. Normal respiratory effort. Skin: LUE markedly edematous from shoulder to upper forearm; fistula visible but flush with swollen skin. Strong radial pulse. Perfusion of hand and fingers intact. Sensation left hand intact. Musculoskeletal: grossly normal tone BUE/BLE Psychiatric: grossly normal mood and affect, speech fluent and appropriate  New data reviewed:  No new labs  Scheduled Meds: . aspirin EC  81 mg Oral Daily  . atorvastatin  80 mg Oral q1800  . carvedilol  3.125  mg Oral BID WC  . citalopram  10 mg Oral Daily  . ezetimibe  10 mg Oral Daily  . multivitamin  1 tablet Oral QHS  . pantoprazole  40 mg Oral Daily  . sodium chloride  3 mL Intravenous Q12H   Continuous Infusions:   Principal Problem:   Orthostatic hypotension Active Problems:   CAD (coronary artery disease)   End-stage renal disease on hemodialysis (H[facility])   Traumatic hematoma of left upper arm   Anemia of chronic disease    time 35 minutes

## 2015-02-11 NOTE — Consult Note (Signed)
CARLA JANICKE MRN: [id number] DOB/AGE: May 31, 1945 69 y.o. Primary Care Physician:Scott Wolfgang Phoenix, MD Admit date: 02/09/2015 Chief Complaint:  Chief Complaint  Patient presents with  . Hypotension   HPI: Pt is 69 year old African [place] male with past medical hx of ESRD who came to ER with c/o low blood pressure.  HPI dates back to Monday when pt was hypotensive after dialysis and came to ER.  IN ER pt was found to be dizzy and admitted for further care. Pt also informed that he has pain in left arm.his arm had US done and it showed hematoma. Pt says his pain has progressively increased over past 2 days and is now worse and constant. NO c/o chest pain No c/o dsypnea No c/o fever/cough/chills  NO c/o hematuria.   Past Medical History  Diagnosis Date  . Hypertension   . COPD (chronic obstructive pulmonary disease) ([place])   . Hyperlipidemia   . Cardiomyopathy     EF of 30% per echo in June of 2012; admitted with congestive heart failure; nonobstructive CAD on cath in 08/2010  . History of noncompliance with medical treatment   . Cerebrovascular disease 2011    CVA  in 02/2010-only deficit is decreased vision in  right eye  . Alcohol abuse   . Tobacco abuse     40 pack years  . Abdominal aortic aneurysm ([place])     Stent graft repair  . Chronic systolic CHF (congestive heart failure) ([place])   . Leg pain   . CKD (chronic kidney disease) stage 3, GFR 30-59 ml/min     creatinine-1.7 in 08/2010  . CAD (coronary artery disease)   . Myocardial infarction acute ([place]) 10/01/11  . CKD (chronic kidney disease), stage III 12/06/2012  . Effusion of right knee joint 12/06/2012  . Anemia   . Atrial fibrillation ([place])   . Ataxic gait   . Weakness generalized   . Frequent falls   . Forgetfulness   . Stroke [facility]) 2011    decreased vision of right eye    Most recent 05/23/14 - bleed - has a drag to his left leg  . Depression   . Anxiety     Takes Citalopram  . Arthritis     knees  . Renal  insufficiency        Family History  Problem Relation Age of Onset  . Colon cancer Neg Hx   . Anesthesia problems Neg Hx   . Hypotension Neg Hx   . Malignant hyperthermia Neg Hx   . Pseudochol deficiency Neg Hx   . Cancer Mother     Gall Bladder  . Heart disease Mother     before age 57  . Hyperlipidemia Mother   . Hypertension Mother   . Asthma Mother   . Cancer Sister     Cervical  NO hx of ESRD  Social History:  reports that he quit smoking about 10 months ago. His smoking use included Cigarettes. He started smoking about 57 years ago. He has a 25 pack-year smoking history. He has never used smokeless tobacco. He reports that he does not drink alcohol or use illicit drugs.   Allergies: No Known Allergies  Medications Prior to Admission  Medication Sig Dispense Refill  . aspirin EC 81 MG tablet Take 81 mg by mouth daily.    Marland Kitchen atorvastatin (LIPITOR) 80 MG tablet TAKE 1 TABLET (80 MG TOTAL) BY MOUTH DAILY AT 6 PM. 90 tablet 3  . b complex-vitamin c-folic  acid (NEPHRO-VITE) 0.8 MG TABS tablet Take 1 tablet by mouth daily.  11  . carvedilol (COREG) 3.125 MG tablet Take 1 tablet (3.125 mg total) by mouth 2 (two) times daily. (Patient taking differently: Take 3.125 mg by mouth daily. **May take an additional tab as needed based on BP levels) 60 tablet 6  . citalopram (CELEXA) 10 MG tablet TAKE 1 TABLET BY MOUTH EVERY DAY 30 tablet 2  . lidocaine-prilocaine (EMLA) cream Apply 1 application topically as needed.  6  . pantoprazole (PROTONIX) 40 MG tablet Take 1 tablet (40 mg total) by mouth daily. 30 tablet 11  . polyethylene glycol (MIRALAX / GLYCOLAX) packet Take 17 g by mouth daily. (Patient taking differently: Take 17 g by mouth daily as needed for mild constipation. ) 14 each 0  . VOLTAREN 1 % GEL Apply 4 g topically daily as needed (for knee pain). Bilateral Knee  1  . ZETIA 10 MG tablet TAKE 1 TABLET (10 MG TOTAL) BY MOUTH DAILY. 30 tablet 6       [id number] from the  symptoms mentioned above,there are no other symptoms referable to all systems reviewed.  Marland Kitchen aspirin EC  81 mg Oral Daily  . atorvastatin  80 mg Oral q1800  . carvedilol  3.125 mg Oral BID WC  . citalopram  10 mg Oral Daily  . ezetimibe  10 mg Oral Daily  . multivitamin  1 tablet Oral QHS  . pantoprazole  40 mg Oral Daily  . sodium chloride  3 mL Intravenous Q12H      Physical Exam: Vital signs in last 24 hours: Temp:  [98.2 F (36.8 C)-98.7 F (37.1 C)] 98.2 F (36.8 C) (11/23 0605) Pulse Rate:  [71-84] 79 (11/23 0605) Resp:  [18] 18 (11/23 0605) BP: (129-142)/(69-84) 132/69 mmHg (11/23 0605) SpO2:  [97 %-98 %] 97 % (11/23 0605) Weight change:  Last BM Date: 02/09/15  Intake/Output from previous day: 11/22 0701 - 11/23 0700 In: 480 [P.O.:480] Out: 100 [Urine:100] Total I/O In: 240 [P.O.:240] Out: -    Physical Exam: General- pt is awake,alert, oriented to time place and person Resp- No acute REsp distress, CTA B/L NO Rhonchi CVS- S1S2 regular in rate and rhythm GIT- BS+, soft, NT, ND EXT- NO LE Edema, NO Cyanosis          Left arm swollen , tender, capillary refill less than  3 sec CNS- CN 2-12 grossly intact. Moving all 4 extremities Psych- normal mood and affect Access-  AVF- left arm bruit +   Lab Results: CBC  Recent Labs  02/09/15 1825 02/10/15 0616  WBC 6.1 6.7  HGB 11.7* 10.8*  HCT 35.1* 33.3*  PLT 171 160    BMET  Recent Labs  02/09/15 1825 02/10/15 0616  NA 139 140  K 3.9 4.0  CL 94* 98*  CO2 31 31  GLUCOSE 174* 94  BUN 18 27*  CREATININE 4.38* 5.80*  CALCIUM 9.1 8.8*    MICRO No results found for this or any previous visit (from the past 240 hour(s)).    Lab Results  Component Value Date   CALCIUM 8.8* 02/10/2015   CAION 1.17 01/15/2015      Impression: 1)Renal  ESRD on HD                Pt is on MWF schedule                Pt has no acute need of hd at this time  from volume standpoint,pt labs are pending                  If labs are grossly abnormal will ask for temporary cath and dialyze                 We have also placed call to interventional radiology at [facility] cone to see if tunneled cath can be placed as this AVF site/hemtoma will take 2-3 weeks to heal.     2)HTN  stable  Medication- On Alpha and beta Blockers   3)Anemia HGb at goal (9--11) eop during HD  4)CKD Mineral-Bone Disorder Calcium  Is at goal.  5)Vascular- pt admitted with pain in fistula site   U/s shows hematoma.     Vascular consulted - recommend conservative measures.     6)Electrolytes-labs from this am pending Normokalemic NOrmonatremic   7)Acid base Co2 at goal     Plan:   Pt has no acute need of hd at this time from volume standpoint,pt labs are pending   If labs are grossly abnormal will ask for temporary cath and dialyze   We have also placed call to interventional radiology at [facility] cone to see if tunneled cath can be placed as this AVF site/hemtoma will take 2-3 weeks to heal.       Klare Criss S 02/11/2015, 12:54 PM

## 2015-02-12 DIAGNOSIS — I951 Orthostatic hypotension: Secondary | ICD-10-CM | POA: Diagnosis not present

## 2015-02-12 DIAGNOSIS — S40022A Contusion of left upper arm, initial encounter: Secondary | ICD-10-CM | POA: Diagnosis not present

## 2015-02-12 DIAGNOSIS — Z992 Dependence on renal dialysis: Secondary | ICD-10-CM | POA: Diagnosis not present

## 2015-02-12 DIAGNOSIS — N186 End stage renal disease: Secondary | ICD-10-CM | POA: Diagnosis not present

## 2015-02-12 LAB — BASIC METABOLIC PANEL
Anion gap: 10 (ref 5–15)
BUN: 54 mg/dL — AB (ref 6–20)
CALCIUM: 8.8 mg/dL — AB (ref 8.9–10.3)
CHLORIDE: 99 mmol/L — AB (ref 101–111)
CO2: 28 mmol/L (ref 22–32)
CREATININE: 9.11 mg/dL — AB (ref 0.61–1.24)
GFR calc non Af Amer: 5 mL/min — ABNORMAL LOW (ref >60)
GFR, EST AFRICAN AMERICAN: 6 mL/min — AB (ref >60)
Glucose, Bld: 100 mg/dL — ABNORMAL HIGH (ref 65–99)
POTASSIUM: 4.5 mmol/L (ref 3.5–5.1)
SODIUM: 137 mmol/L (ref 135–145)

## 2015-02-12 LAB — CBC
HCT: 25.7 % — ABNORMAL LOW (ref 39.0–52.0)
HEMOGLOBIN: 8.7 g/dL — AB (ref 13.0–17.0)
MCH: 34.7 pg — AB (ref 26.0–34.0)
MCHC: 33.9 g/dL (ref 30.0–36.0)
MCV: 102.4 fL — ABNORMAL HIGH (ref 78.0–100.0)
PLATELETS: 143 10*3/uL — AB (ref 150–400)
RBC: 2.51 MIL/uL — AB (ref 4.22–5.81)
RDW: 15.5 % (ref 11.5–15.5)
WBC: 7.5 10*3/uL (ref 4.0–10.5)

## 2015-02-12 MED ORDER — CHLORHEXIDINE GLUCONATE CLOTH 2 % EX PADS
6.0000 | MEDICATED_PAD | Freq: Once | CUTANEOUS | Status: AC
  Administered 2015-02-13: 6 via TOPICAL

## 2015-02-12 MED ORDER — SODIUM CHLORIDE 0.9 % IV SOLN: INTRAVENOUS | Status: DC

## 2015-02-12 MED ORDER — HEPARIN SODIUM (PORCINE) 1000 UNIT/ML DIALYSIS
1000.0000 [IU] | INTRAMUSCULAR | Status: DC | PRN
  Filled 2015-02-12: qty 1

## 2015-02-12 NOTE — Progress Notes (Signed)
PROGRESS NOTE  [name] [id number] DOB: [birth year] DOA: 02/09/2015 PCP: Greg Lange, MD  Summary: 10yom PMH ESRD on HD MWF who had dizziness and low BP at outpatient HD 11/21. Was sent to ED where was found to have profound orthostasis resulting in syncope in ED. Admitted for orthostasis thought secondary to too much volume removed.   Assessment/Plan: 1. LUE arm pain near fistula site, edema secondary to large hematoma resulting in brachial vein compression, AV fistula patent on u/s. Perfusion intact LUE. Discussed with VVS [name]. Plan for HD catheter placement 11/25 at [facility], return to [facility], HD then discharge. 2. ESRD MWF at [facility] in [place]. Potassium normal. No need for HD today. 3. Orthostatic hypotension, resolved, secondary to bleeding and suspected excess volume removal with HD. 4. Anemia of CKD, stable.  5. COPD 6. Chronic systolic CHF 7. PMH stroke, AAA s/p AAA repair, CKD stage III, atrial fibarillation   Overall stable.   Plan for HD catheter placement 11/25, then HD and home.  Case won't be before 10AM, therefore won't be able to make outpatient HD.    Code Status: full code DVT prophylaxis: SCDs Family Communication: wife at bedside, daughter at bedside Disposition Plan: Anticipate discharge within 24 hours.   Greg Hodgkins, MD  [facility]  Pager [phone] If 7PM-7AM, please contact night-coverage at [email or web address], password [place] 02/12/2015, 6:36 AM    Consultants:    Procedures:    Antibiotics:    HPI/Subjective: Pain in arm is the same but managed well with medications. Feeling and strength is normal. Breathing is fine.   Objective: Filed Vitals:   02/11/15 0605 02/11/15 1414 02/11/15 2054 02/12/15 0515  BP: 132/69 145/75 136/79 165/94  Pulse: 79 78 77 69  Temp: 98.2 F (36.8 C) 98.5 F (36.9 C) 98.6 F (37 C) 98.5 F (36.9 C)  TempSrc: Oral Oral Oral Oral  Resp: 18 18 20 20   Height:      Weight:      SpO2: 97%  99% 99% 100%    Intake/Output Summary (Last 24 hours) at 02/12/15 0636 Last data filed at 02/12/15 0516  Gross per 24 hour  Intake    720 ml  Output    200 ml  Net    520 ml     Filed Weights   02/09/15 1727 02/09/15 2130  Weight: 72.576 kg (160 lb) 70.353 kg (155 lb 1.6 oz)    Exam: Afebrile, VSS, not hypoxic General:  Appears comfortable, calm. Cardiovascular: Regular rate and rhythm, no murmur, rub or gallop. No lower extremity edema. Telemetry: Sinus rhythm, no arrhythmias  Respiratory: Clear to auscultation bilaterally, no wheezes, rales or rhonchi. Normal respiratory effort. Abdomen: soft, ntnd Skin: No change in edema in bicep. Swelling in shoulder appears improved. Increased swelling in forearm. Strong radial pulse on left, capillary refill normal left digits. Strength and mobility in hand appears normal..  Psychiatric: grossly normal mood and affect, speech fluent and appropriate  New data reviewed:  Potassium 4.5   Hgb 8.7  BMP consistent with ESRD  Scheduled Meds: . aspirin EC  81 mg Oral Daily  . atorvastatin  80 mg Oral q1800  . carvedilol  3.125 mg Oral BID WC  . citalopram  10 mg Oral Daily  . ezetimibe  10 mg Oral Daily  . multivitamin  1 tablet Oral QHS  . pantoprazole  40 mg Oral Daily  . sodium chloride  3 mL Intravenous Q12H   Continuous Infusions:   Principal Problem:  Orthostatic hypotension Active Problems:   End-stage renal disease on hemodialysis (HCC)   Traumatic hematoma of left upper arm   Anemia of chronic disease   Time 20 minutes   By signing my name below, I, [name] attest that this documentation has been prepared under the direction and in the presence of Greg Hodgkins, MD Electronically signed: Rennis [name]  02/12/2015 12:45pm    I personally performed the services described in this documentation. All medical record entries made by the scribe were at my direction. I have reviewed the chart and agree that  the record reflects my personal performance and is accurate and complete. Greg Hodgkins, MD

## 2015-02-12 NOTE — Progress Notes (Signed)
Subjective: Patient presently denies any nausea or vomiting. He denies also any difficulty breathing. Still has the swelling got his left arm but no pain.   Objective: Vital signs in last 24 hours: Temp:  [98.5 F (36.9 C)-98.6 F (37 C)] 98.5 F (36.9 C) (11/24 0515) Pulse Rate:  [69-78] 69 (11/24 0515) Resp:  [18-20] 20 (11/24 0515) BP: (136-165)/(75-94) 165/94 mmHg (11/24 0515) SpO2:  [99 %-100 %] 100 % (11/24 0515) Weight:  [161 lb 3.2 oz (73.12 kg)] 161 lb 3.2 oz (73.12 kg) (11/24 0647)  Intake/Output from previous day: 11/23 0701 - 11/24 0700 In: 720 [P.O.:720] Out: 200 [Urine:200] Intake/Output this shift:     Recent Labs  02/09/15 1825 02/10/15 0616 02/11/15 1339 02/12/15 0626  HGB 11.7* 10.8* 9.0* 8.7*    Recent Labs  02/11/15 1339 02/12/15 0626  WBC 7.4 7.5  RBC 2.61* 2.51*  HCT 26.9* 25.7*  PLT 150 143*    Recent Labs  02/11/15 1339 02/12/15 0626  NA 137 137  K 4.2 4.5  CL 97* 99*  CO2 29 28  BUN 44* 54*  CREATININE 8.28* 9.11*  GLUCOSE 113* 100*  CALCIUM 8.6* 8.8*   No results for input(s): LABPT, INR in the last 72 hours.  Generally patient is alert and in no apparent distress. Chest is clear to auscultation Heart exam regular rate and rhythm no murmur Extremities no edema  Assessment/Plan: Problem #1 end-stage renal disease: Patient presently does not have any uremic sinus symptoms. He was not dialyzed yesterday. His potassium is normal. Problem #2 hypertension: His blood pressure is reasonably controlled. Problem #3 history of dizziness: This is associated with intra-dialysis hypotension. Presently is asymptomatic. Problem #4 hematoma of his left arm with his fistula. Patient is still with some swelling but no tenderness. His fistula has failed and bruit. Problem #5 anemia: His hemoglobin is declining colon possibly related to his bleeding. Problem #6 history of coronary artery disease and cardiomyopathy with low ejection  fraction. Problem #7 fluid management: Patient does not have any significant sign of fluid overload Problem #8 metabolic bone disease calcium is in range. Plan:1] Patient at this moment doesn't require any dialysis today 2] patient will have a tunneled catheter placed tomorrow and we'll make arrangement for dialysis after placement. 3] will leave the fistula for couple of weeks until the swelling improves. 4] check his basic metabolic panel, CBC and phosphorus in the morning.   Shadia Larose S 02/12/2015, 8:05 AM

## 2015-02-13 ENCOUNTER — Observation Stay (HOSPITAL_COMMUNITY): Payer: Medicare Other | Admitting: Anesthesiology

## 2015-02-13 ENCOUNTER — Encounter (HOSPITAL_COMMUNITY)
Admission: EM | Disposition: A | Discharge status: Home / Self Care | Payer: Self-pay | Source: Home / Self Care | Attending: Emergency Medicine

## 2015-02-13 DIAGNOSIS — I132 Hypertensive heart and chronic kidney disease with heart failure and with stage 5 chronic kidney disease, or end stage renal disease: Secondary | ICD-10-CM | POA: Diagnosis not present

## 2015-02-13 DIAGNOSIS — D638 Anemia in other chronic diseases classified elsewhere: Secondary | ICD-10-CM | POA: Diagnosis not present

## 2015-02-13 DIAGNOSIS — Z992 Dependence on renal dialysis: Secondary | ICD-10-CM | POA: Diagnosis not present

## 2015-02-13 DIAGNOSIS — I5020 Unspecified systolic (congestive) heart failure: Secondary | ICD-10-CM | POA: Diagnosis not present

## 2015-02-13 DIAGNOSIS — N185 Chronic kidney disease, stage 5: Secondary | ICD-10-CM

## 2015-02-13 DIAGNOSIS — T82838A Hemorrhage of vascular prosthetic devices, implants and grafts, initial encounter: Secondary | ICD-10-CM | POA: Diagnosis not present

## 2015-02-13 DIAGNOSIS — N186 End stage renal disease: Secondary | ICD-10-CM | POA: Diagnosis not present

## 2015-02-13 DIAGNOSIS — S40022A Contusion of left upper arm, initial encounter: Secondary | ICD-10-CM | POA: Diagnosis not present

## 2015-02-13 HISTORY — PX: INSERTION OF DIALYSIS CATHETER: SHX1324

## 2015-02-13 LAB — CBC
HCT: 22 % — ABNORMAL LOW (ref 39.0–52.0)
HCT: 22.1 % — ABNORMAL LOW (ref 39.0–52.0)
HEMOGLOBIN: 7.6 g/dL — AB (ref 13.0–17.0)
Hemoglobin: 7.5 g/dL — ABNORMAL LOW (ref 13.0–17.0)
MCH: 34.7 pg — ABNORMAL HIGH (ref 26.0–34.0)
MCH: 34.7 pg — ABNORMAL HIGH (ref 26.0–34.0)
MCHC: 34.1 g/dL (ref 30.0–36.0)
MCHC: 34.4 g/dL (ref 30.0–36.0)
MCV: 101.9 fL — ABNORMAL HIGH (ref 78.0–100.0)
MCV: 100.9 fL — ABNORMAL HIGH (ref 78.0–100.0)
PLATELETS: 145 10*3/uL — AB (ref 150–400)
PLATELETS: 145 10*3/uL — AB (ref 150–400)
RBC: 2.16 MIL/uL — AB (ref 4.22–5.81)
RBC: 2.19 MIL/uL — AB (ref 4.22–5.81)
RDW: 15.6 % — AB (ref 11.5–15.5)
RDW: 15.6 % — ABNORMAL HIGH (ref 11.5–15.5)
WBC: 5.9 10*3/uL (ref 4.0–10.5)
WBC: 7.3 10*3/uL (ref 4.0–10.5)

## 2015-02-13 LAB — RENAL FUNCTION PANEL
ALBUMIN: 3.5 g/dL (ref 3.5–5.0)
Anion gap: 13 (ref 5–15)
BUN: 66 mg/dL — AB (ref 6–20)
CALCIUM: 8.3 mg/dL — AB (ref 8.9–10.3)
CHLORIDE: 99 mmol/L — AB (ref 101–111)
CO2: 26 mmol/L (ref 22–32)
CREATININE: 9.84 mg/dL — AB (ref 0.61–1.24)
GFR, EST AFRICAN AMERICAN: 5 mL/min — AB (ref >60)
GFR, EST NON AFRICAN AMERICAN: 5 mL/min — AB (ref >60)
Glucose, Bld: 86 mg/dL (ref 65–99)
PHOSPHORUS: 5.8 mg/dL — AB (ref 2.5–4.6)
Potassium: 4.7 mmol/L (ref 3.5–5.1)
SODIUM: 138 mmol/L (ref 135–145)

## 2015-02-13 LAB — GLUCOSE, CAPILLARY: Glucose-Capillary: 95 mg/dL (ref 65–99)

## 2015-02-13 LAB — SURGICAL PCR SCREEN
MRSA, PCR: NEGATIVE
STAPHYLOCOCCUS AUREUS: NEGATIVE

## 2015-02-13 LAB — PHOSPHORUS: Phosphorus: 5.7 mg/dL — ABNORMAL HIGH (ref 2.5–4.6)

## 2015-02-13 SURGERY — INSERTION OF DIALYSIS CATHETER
Anesthesia: Monitor Anesthesia Care | Site: Chest | Laterality: Left

## 2015-02-13 MED ORDER — PHENYLEPHRINE HCL 10 MG/ML IJ SOLN
INTRAMUSCULAR | Status: DC | PRN
  Administered 2015-02-13 (×2): 120 ug via INTRAVENOUS
  Administered 2015-02-13 (×2): 80 ug via INTRAVENOUS

## 2015-02-13 MED ORDER — SODIUM CHLORIDE 0.9 % IV SOLN
INTRAVENOUS | Status: DC
  Administered 2015-02-13: 09:00:00 via INTRAVENOUS

## 2015-02-13 MED ORDER — MIDAZOLAM HCL 2 MG/2ML IJ SOLN
INTRAMUSCULAR | Status: AC
  Filled 2015-02-13: qty 2

## 2015-02-13 MED ORDER — ALTEPLASE 2 MG IJ SOLR: 2.0000 mg | Freq: Once | INTRAMUSCULAR | Status: DC | PRN

## 2015-02-13 MED ORDER — FENTANYL CITRATE (PF) 250 MCG/5ML IJ SOLN
INTRAMUSCULAR | Status: AC
  Filled 2015-02-13: qty 5

## 2015-02-13 MED ORDER — SODIUM CHLORIDE 0.9 % IV SOLN: 100.0000 mL | INTRAVENOUS | Status: DC | PRN

## 2015-02-13 MED ORDER — FENTANYL CITRATE (PF) 100 MCG/2ML IJ SOLN
INTRAMUSCULAR | Status: DC | PRN
  Administered 2015-02-13 (×2): 50 ug via INTRAVENOUS

## 2015-02-13 MED ORDER — EPOETIN ALFA 10000 UNIT/ML IJ SOLN
INTRAMUSCULAR | Status: AC
  Administered 2015-02-13: 10000 [IU] via INTRAVENOUS
  Filled 2015-02-13: qty 1

## 2015-02-13 MED ORDER — MIDAZOLAM HCL 5 MG/5ML IJ SOLN
INTRAMUSCULAR | Status: DC | PRN
  Administered 2015-02-13 (×2): 1 mg via INTRAVENOUS

## 2015-02-13 MED ORDER — PROPOFOL 500 MG/50ML IV EMUL
INTRAVENOUS | Status: DC | PRN
  Administered 2015-02-13: 50 ug/kg/min via INTRAVENOUS

## 2015-02-13 MED ORDER — EPOETIN ALFA 10000 UNIT/ML IJ SOLN
10000.0000 [IU] | INTRAMUSCULAR | Status: DC
  Administered 2015-02-13: 10000 [IU] via INTRAVENOUS

## 2015-02-13 MED ORDER — HEPARIN SODIUM (PORCINE) 1000 UNIT/ML IJ SOLN
INTRAMUSCULAR | Status: DC | PRN
  Administered 2015-02-13: 5000 [IU]

## 2015-02-13 MED ORDER — PROMETHAZINE HCL 25 MG/ML IJ SOLN: 6.2500 mg | INTRAMUSCULAR | Status: DC | PRN

## 2015-02-13 MED ORDER — IOHEXOL 300 MG/ML  SOLN
INTRAMUSCULAR | Status: DC | PRN
  Administered 2015-02-13: 8 mL via INTRAVENOUS

## 2015-02-13 MED ORDER — DEXTROSE 5 % IV SOLN
1.5000 g | INTRAVENOUS | Status: AC
  Administered 2015-02-13: 1.5 g via INTRAVENOUS
  Filled 2015-02-13: qty 1.5

## 2015-02-13 MED ORDER — LIDOCAINE-EPINEPHRINE (PF) 1 %-1:200000 IJ SOLN
INTRAMUSCULAR | Status: AC
  Filled 2015-02-13: qty 30

## 2015-02-13 MED ORDER — SODIUM CHLORIDE 0.9 % IV SOLN
INTRAVENOUS | Status: DC | PRN
  Administered 2015-02-13: 500 mL

## 2015-02-13 MED ORDER — HYDROMORPHONE HCL 1 MG/ML IJ SOLN: 0.2500 mg | INTRAMUSCULAR | Status: DC | PRN

## 2015-02-13 MED ORDER — LIDOCAINE-EPINEPHRINE (PF) 1 %-1:200000 IJ SOLN
INTRAMUSCULAR | Status: DC | PRN
  Administered 2015-02-13: 13 mL

## 2015-02-13 MED ORDER — HEPARIN SODIUM (PORCINE) 1000 UNIT/ML IJ SOLN
INTRAMUSCULAR | Status: AC
  Filled 2015-02-13: qty 1

## 2015-02-13 SURGICAL SUPPLY — 53 items
BAG BANDED W/RUBBER/TAPE 36X54 (MISCELLANEOUS) ×2 IMPLANT
BAG DECANTER FOR FLEXI CONT (MISCELLANEOUS) ×2 IMPLANT
BAG EQP BAND 135X91 W/RBR TAPE (MISCELLANEOUS) ×1
BIOPATCH RED 1 DISK 7.0 (GAUZE/BANDAGES/DRESSINGS) ×2 IMPLANT
BLADE SURG 11 STRL SS (BLADE) ×1 IMPLANT
CATH ANGIO 5F BER2 65CM (CATHETERS) ×1 IMPLANT
CATH CANNON HEMO 15F 50CM (CATHETERS) IMPLANT
CATH CANNON HEMO 15FR 19 (HEMODIALYSIS SUPPLIES) IMPLANT
CATH CANNON HEMO 15FR 23CM (HEMODIALYSIS SUPPLIES) IMPLANT
CATH CANNON HEMO 15FR 31CM (HEMODIALYSIS SUPPLIES) IMPLANT
CATH CANNON HEMO 15FR 32 (HEMODIALYSIS SUPPLIES) IMPLANT
CATH CANNON HEMO 15FR 32CM (HEMODIALYSIS SUPPLIES) ×2 IMPLANT
COVER DOME SNAP 22 D (MISCELLANEOUS) ×1 IMPLANT
COVER PROBE W GEL 5X96 (DRAPES) ×2 IMPLANT
COVER SURGICAL LIGHT HANDLE (MISCELLANEOUS) ×2 IMPLANT
DRAPE C-ARM 42X72 X-RAY (DRAPES) ×2 IMPLANT
DRAPE CHEST BREAST 15X10 FENES (DRAPES) ×2 IMPLANT
GAUZE SPONGE 2X2 8PLY STRL LF (GAUZE/BANDAGES/DRESSINGS) IMPLANT
GAUZE SPONGE 4X4 16PLY XRAY LF (GAUZE/BANDAGES/DRESSINGS) ×2 IMPLANT
GLOVE BIOGEL PI IND STRL 6.5 (GLOVE) IMPLANT
GLOVE BIOGEL PI IND STRL 7.0 (GLOVE) IMPLANT
GLOVE BIOGEL PI IND STRL 7.5 (GLOVE) ×1 IMPLANT
GLOVE BIOGEL PI INDICATOR 6.5 (GLOVE) ×1
GLOVE BIOGEL PI INDICATOR 7.0 (GLOVE) ×1
GLOVE BIOGEL PI INDICATOR 7.5 (GLOVE) ×1
GLOVE SURG SS PI 7.5 STRL IVOR (GLOVE) ×2 IMPLANT
GOWN STRL REUS W/ TWL LRG LVL3 (GOWN DISPOSABLE) ×1 IMPLANT
GOWN STRL REUS W/ TWL XL LVL3 (GOWN DISPOSABLE) ×1 IMPLANT
GOWN STRL REUS W/TWL LRG LVL3 (GOWN DISPOSABLE) ×2
GOWN STRL REUS W/TWL XL LVL3 (GOWN DISPOSABLE) ×2
KIT BASIN OR (CUSTOM PROCEDURE TRAY) ×2 IMPLANT
KIT ROOM TURNOVER OR (KITS) ×2 IMPLANT
LIQUID BAND (GAUZE/BANDAGES/DRESSINGS) ×2 IMPLANT
NDL 18GX1X1/2 (RX/OR ONLY) (NEEDLE) ×1 IMPLANT
NDL HYPO 25GX1X1/2 BEV (NEEDLE) ×1 IMPLANT
NEEDLE 18GX1X1/2 (RX/OR ONLY) (NEEDLE) ×4 IMPLANT
NEEDLE HYPO 25GX1X1/2 BEV (NEEDLE) ×4 IMPLANT
NS IRRIG 1000ML POUR BTL (IV SOLUTION) ×2 IMPLANT
PACK SURGICAL SETUP 50X90 (CUSTOM PROCEDURE TRAY) ×2 IMPLANT
PAD ARMBOARD 7.5X6 YLW CONV (MISCELLANEOUS) ×4 IMPLANT
SET MICROPUNCTURE 5F STIFF (MISCELLANEOUS) ×2 IMPLANT
SOAP 2 % CHG 4 OZ (WOUND CARE) ×2 IMPLANT
SPONGE GAUZE 2X2 STER 10/PKG (GAUZE/BANDAGES/DRESSINGS) ×1
SUT ETHILON 3 0 PS 1 (SUTURE) ×2 IMPLANT
SUT VICRYL 4-0 PS2 18IN ABS (SUTURE) ×2 IMPLANT
SYR 20CC LL (SYRINGE) ×4 IMPLANT
SYR 5ML LL (SYRINGE) ×2 IMPLANT
SYR CONTROL 10ML LL (SYRINGE) ×2 IMPLANT
SYRINGE 10CC LL (SYRINGE) ×6 IMPLANT
TAPE CLOTH SURG 4X10 WHT LF (GAUZE/BANDAGES/DRESSINGS) ×1 IMPLANT
WATER STERILE IRR 1000ML POUR (IV SOLUTION) ×2 IMPLANT
WIRE BENTSON .035X145CM (WIRE) ×1 IMPLANT
WIRE ROSEN 145CM (WIRE) ×1 IMPLANT

## 2015-02-13 NOTE — Anesthesia Preprocedure Evaluation (Addendum)
Anesthesia Evaluation  Patient identified by MRN, date of birth, ID band Patient awake    Reviewed: Allergy & Precautions, NPO status   History of Anesthesia Complications Negative for: history of anesthetic complications  Airway Mallampati: I  TM Distance: >3 FB Neck ROM: Full    Dental  (+) Edentulous Upper, Edentulous Lower   Pulmonary COPD, former smoker,    Pulmonary exam normal        Cardiovascular hypertension, + Past MI, + Peripheral Vascular Disease and +CHF  Normal cardiovascular exam  Study Conclusions  - Left ventricle: The cavity size was normal. There was moderate concentric hypertrophy. Systolic function was normal. The estimated ejection fraction was in the range of 50% to 55%. Wall motion was normal; there were no regional wall motion abnormalities. Doppler parameters are consistent with abnormal left ventricular relaxation (grade 1 diastolic dysfunction). Doppler parameters are consistent with high ventricular filling pressure. - Ventricular septum: Septal motion showed abnormal function and dyssynergy. These changes are consistent with intraventricular conduction delay. - Aortic valve: Mildly thickened, mildly calcified leaflets. There was moderate regurgitation. - Mitral valve: There was trivial regurgitation. - Left atrium: The atrium was moderately dilated. Volume/bsa, ES (1-plane Simpson&'s, A4C): 37.1 ml/m^2. - Right atrium: The atrium was mildly dilated. - Tricuspid valve: There was trivial regurgitation.     Neuro/Psych PSYCHIATRIC DISORDERS Anxiety Depression CVA    GI/Hepatic negative GI ROS, Neg liver ROS,   Endo/Other  negative endocrine ROS  Renal/GU ESRF and DialysisRenal disease     Musculoskeletal   Abdominal   Peds  Hematology   Anesthesia Other Findings   Reproductive/Obstetrics                           Anesthesia  Physical Anesthesia Plan  ASA: III  Anesthesia Plan: MAC   Post-op Pain Management:    Induction:   Airway Management Planned: Simple Face Mask  Additional Equipment:   Intra-op Plan:   Post-operative Plan:   Informed Consent: I have reviewed the patients History and Physical, chart, labs and discussed the procedure including the risks, benefits and alternatives for the proposed anesthesia with the patient or authorized representative who has indicated his/her understanding and acceptance.     Plan Discussed with: CRNA, Anesthesiologist and Surgeon  Anesthesia Plan Comments:        Anesthesia Quick Evaluation

## 2015-02-13 NOTE — Procedures (Addendum)
   HEMODIALYSIS TREATMENT NOTE:  Heparin-free dialysis initiated via newly-placed left IJ tunneled catheter. Primary RN reported that catheter exit site was oozing blood and had already been changed once since cath placement. Within first hour of HD, the dressing had been saturated (six 4x4 pads). Pt's underpad had a blood stain about 8 inches in diameter. Catheter exit site was dressed with two surgifoam pads and gauze drain sponges were then secured with hypafix. Pt's Hgb was 7.5 prior to catheter placement. Situation discussed with Dr. Sarajane Jews; CBC ordered. Will continue to monitor.  Rockwell Alexandria, RN, CDN    [phone] addendum:  Hgb 7.6. Pt is halfway through dialysis treatment.  Had one hypotensive episode (SBP 86). Ultrafiltration stopped. SBP now 100-120. Dr. Sarajane Jews is aware. PLAN: overnight observation. Pt is agreeable. Renal diet ordered.  2000 addendum:  Unable to achieve goal of 1.5 liters. Net UF 1 liter.  Catheter dressing clean, dry, and intact x2.5 hours. Hemodynamically stable last half of HD. All blood was reinfused. Report called to Kendra Opitz, RN.

## 2015-02-13 NOTE — Progress Notes (Signed)
Tobias Alexander MD informed of pts hgb 7.5.  No new orders at this time.

## 2015-02-13 NOTE — Care Management Note (Signed)
Case Management Note  Patient Details  Name: [name] MRN: [id number] Date of Birth: 1945-07-08  Subjective/Objective:                    Action/Plan:   Expected Discharge Date:                  Expected Discharge Plan:  Home/Self Care  In-House Referral:  NA  Discharge planning Services  CM Consult  Post Acute Care Choice:  NA Choice offered to:  NA  DME Arranged:    DME Agency:     HH Arranged:    [place] Agency:     Status of Service:  Completed, signed off  Medicare Important Message Given:    Date Medicare IM Given:    Medicare IM give by:    Date Additional Medicare IM Given:    Additional Medicare Important Message give by:     If discussed at [place] of Stay Meetings, dates discussed:    Additional Comments: Pt had tunneled catheter placed today for dialysis. Pt to receive dialysis and blood and then discharge home. Christinia Gully [place], RN 02/13/2015, 2:27 PM

## 2015-02-13 NOTE — Progress Notes (Signed)
Subjective: Patient offers no complaint  Objective: Vital signs in last 24 hours: Temp:  [97.8 F (36.6 C)-98.4 F (36.9 C)] 97.8 F (36.6 C) (11/25 0514) Pulse Rate:  [70-96] 70 (11/25 0514) Resp:  [20] 20 (11/25 0514) BP: (131-140)/(75-80) 140/75 mmHg (11/25 0514) SpO2:  [95 %-100 %] 100 % (11/25 0514)  Intake/Output from previous day: 11/24 0701 - 11/25 0700 In: 720 [P.O.:720] Out: 200 [Urine:200] Intake/Output this shift:     Recent Labs  02/11/15 1339 02/12/15 0626 02/13/15 0552  HGB 9.0* 8.7* 7.5*    Recent Labs  02/12/15 0626 02/13/15 0552  WBC 7.5 5.9  RBC 2.51* 2.16*  HCT 25.7* 22.0*  PLT 143* 145*    Recent Labs  02/12/15 0626 02/13/15 0552  NA 137 138  K 4.5 4.7  CL 99* 99*  CO2 28 26  BUN 54* 66*  CREATININE 9.11* 9.84*  GLUCOSE 100* 86  CALCIUM 8.8* 8.3*   No results for input(s): LABPT, INR in the last 72 hours.  Generally patient is alert and in no apparent distress.  Assessment/Plan: Problem #1 end-stage renal disease: Patient is asymptomatic Problem #2 hypertension: His blood pressure is reasonably controlled. Problem #3 history of dizziness: is better Problem #4 hematoma of his left arm with his fistula. Patient is still with some swelling but no tenderness. His fistula has failed and bruit. Problem #5 anemia: His hemoglobin is declining colon possibly related to his bleeding. Problem #6 history of coronary artery disease and cardiomyopathy wi Problem #7 fluid management: Patient does not have any significant sign of fluid overload Problem #8 metabolic bone disease calcium is in range. Plan:1] patient will have a tunneled catheter now and we will make arrangement for dialysis 2] We will remove 1 liter 3] We will use Epogen 10000 u iv after each dialysis 4] check his basic metabolic panel and CBC  the morning.   Miyuki Rzasa S 02/13/2015, 8:07 AM

## 2015-02-13 NOTE — Transfer of Care (Signed)
Immediate Anesthesia Transfer of Care Note  Patient: [name]  Procedure(s) Performed: Procedure(s): INSERTION OF DIALYSIS CATHETER AND CENTRAL VENOGRAM (Left)  Patient Location: PACU  Anesthesia Type:MAC  Level of Consciousness: sedated and patient cooperative  Airway & Oxygen Therapy: Patient Spontanous Breathing and Patient connected to nasal cannula oxygen  Post-op Assessment: Report given to RN, Post -op Vital signs reviewed and stable, Patient moving all extremities, Patient moving all extremities X 4 and Patient able to stick tongue midline  Post vital signs: Reviewed and stable  Last Vitals:  Filed Vitals:   02/13/15 0856 02/13/15 0859  BP: 178/79   Pulse: 47 69  Temp: 36.4 C   Resp: 18     Complications: No apparent anesthesia complications

## 2015-02-13 NOTE — H&P (Signed)
Patient name: [name] MRN: [id number] DOB: 1945-12-09 Sex: male     Chief Complaint  Patient presents with  . Hypotension    HISTORY OF PRESENT ILLNESS: The patient has a history of a left upper arm fistula placed by Dr. early.  He had extravasation with cannulation and this developed a significant hematoma in the upper arm.  He comes in today for dialysis catheter placement.  Past Medical History  Diagnosis Date  . Hypertension   . COPD (chronic obstructive pulmonary disease) ([place])   . Hyperlipidemia   . Cardiomyopathy     EF of 30% per echo in June of 2012; admitted with congestive heart failure; nonobstructive CAD on cath in 08/2010  . History of noncompliance with medical treatment   . Cerebrovascular disease 2011    CVA  in 02/2010-only deficit is decreased vision in  right eye  . Alcohol abuse   . Tobacco abuse     40 pack years  . Abdominal aortic aneurysm ([place])     Stent graft repair  . Chronic systolic CHF (congestive heart failure) ([place])   . Leg pain   . CKD (chronic kidney disease) stage 3, GFR 30-59 ml/min     creatinine-1.7 in 08/2010  . CAD (coronary artery disease)   . Myocardial infarction acute ([place]) 10/01/11  . CKD (chronic kidney disease), stage III 12/06/2012  . Effusion of right knee joint 12/06/2012  . Anemia   . Atrial fibrillation ([place])   . Ataxic gait   . Weakness generalized   . Frequent falls   . Forgetfulness   . Stroke [facility] 2011    decreased vision of right eye    Most recent 05/23/14 - bleed - has a drag to his left leg  . Depression   . Anxiety     Takes Citalopram  . Arthritis     knees  . Renal insufficiency     Past Surgical History  Procedure Laterality Date  . Colonoscopy w/ polypectomy  2006    Dr. Tamala Julian: anal fissure, sessile adenomatous polyp, diverticulosis  . Esophagogastroduodenoscopy  11/15/2010    Procedure: ESOPHAGOGASTRODUODENOSCOPY (EGD);  Surgeon: Dorothyann Peng, MD;  Location: [facility];  Service: Endoscopy;   Laterality: N/A;  with propofol sedation; procedure start @ 0813  . Flexible sigmoidoscopy  11/15/2010    Procedure: FLEXIBLE SIGMOIDOSCOPY;  Surgeon: Dorothyann Peng, MD;  Location: [facility];  Service: Endoscopy;  Laterality: N/A;  with propofol sedation; ended @ 0808  . Polypectomy  12/13/2010    Procedure: POLYPECTOMY;  Surgeon: Dorothyann Peng, MD;  Location: [facility];  Service: Endoscopy;  Laterality: N/A;  right colon, cecal, transverse, and sigmoid polypectomy  . Cardiac catheterization  10/04/11    Normal left main, 95% pLAD stenosis with ulceration s/p successful BMS placement, 30% segmental plaque beyond this, dLAD after D2 with 50% plaquing, then focal 70% lesion, 80% focal short D2 stenosis, 30% pOM2 lesion, 30-40% mOM3 stenosis, Shephard's crook region of RCA with 50% lesion, mRCA 50-60% lesion and mild dRCA irregularities.   . Coronary stent placement  10/04/11  . Abdominal aortic aneurysm repair w/ endoluminal graft      01/16/2008  . Left heart catheterization with coronary angiogram N/A 10/04/2011    Procedure: LEFT HEART CATHETERIZATION WITH CORONARY ANGIOGRAM;  Surgeon: Hillary Bow, MD;  [facility];  Service: Cardiovascular;  Laterality: N/A;  . Av fistula placement Left 07/07/2014    Procedure: Creation of Left  Arm ARTERIOVENOUS Fistula;  Surgeon: Rosetta Posner, MD;  Location: [place];  Service: Vascular;  Laterality: Left;  . Insertion of dialysis catheter Right 08/05/2014    Procedure: INSERTION OF DIALYSIS CATHETER Right internal Jugular;  Surgeon: Conrad Hoyt Lakes, MD;  Location: [place];  Service: Vascular;  Laterality: Right;  . Port-a-cath removal    . Peripheral vascular catheterization Left 01/15/2015    Procedure: Fistulagram;  Surgeon: Conrad Linden, MD;  Location: [place] CV [facility];  Service: Cardiovascular;  Laterality: Left;  arm  . Peripheral vascular catheterization Left 01/15/2015    Procedure: Peripheral Vascular Intervention;  Surgeon: Conrad Viola, MD;   Location: [place] CV LAB;  Service: Cardiovascular;  Laterality: Left;  ARM VEINOUS PTA    Social History   Social History  . Marital Status: Married    Spouse Name: N/A  . Number of Children: N/A  . Years of Education: N/A   Occupational History  . retired     [employer]   Social History Main Topics  . Smoking status: Former Smoker -- 0.50 packs/day for 50 years    Types: Cigarettes    Start date: 06/25/1957    Quit date: 04/04/2014  . Smokeless tobacco: Never Used  . Alcohol Use: No  . Drug Use: No  . Sexual Activity: No   Other Topics Concern  . Not on file   Social History Narrative   Married, retired from [employer], 6 children   Right handed   Caffeine use - none    Family History  Problem Relation Age of Onset  . Colon cancer Neg Hx   . Anesthesia problems Neg Hx   . Hypotension Neg Hx   . Malignant hyperthermia Neg Hx   . Pseudochol deficiency Neg Hx   . Cancer Mother     Gall Bladder  . Heart disease Mother     before age 79  . Hyperlipidemia Mother   . Hypertension Mother   . Asthma Mother   . Cancer Sister     Cervical    Allergies as of 02/09/2015  . (No Known Allergies)    No current facility-administered medications on file prior to encounter.   Current Outpatient Prescriptions on File Prior to Encounter  Medication Sig Dispense Refill  . aspirin EC 81 MG tablet Take 81 mg by mouth daily.    Marland Kitchen atorvastatin (LIPITOR) 80 MG tablet TAKE 1 TABLET (80 MG TOTAL) BY MOUTH DAILY AT 6 PM. 90 tablet 3  . b complex-vitamin c-folic acid (NEPHRO-VITE) 0.8 MG TABS tablet Take 1 tablet by mouth daily.  11  . carvedilol (COREG) 3.125 MG tablet Take 1 tablet (3.125 mg total) by mouth 2 (two) times daily. (Patient taking differently: Take 3.125 mg by mouth daily. **May take an additional tab as needed based on BP levels) 60 tablet 6  . citalopram (CELEXA) 10 MG tablet TAKE 1 TABLET BY MOUTH EVERY DAY 30 tablet 2  . lidocaine-prilocaine (EMLA)  cream Apply 1 application topically as needed.  6  . pantoprazole (PROTONIX) 40 MG tablet Take 1 tablet (40 mg total) by mouth daily. 30 tablet 11  . polyethylene glycol (MIRALAX / GLYCOLAX) packet Take 17 g by mouth daily. (Patient taking differently: Take 17 g by mouth daily as needed for mild constipation. ) 14 each 0  . VOLTAREN 1 % GEL Apply 4 g topically daily as needed (for knee pain). Bilateral Knee  1  . ZETIA 10 MG tablet  TAKE 1 TABLET (10 MG TOTAL) BY MOUTH DAILY. 30 tablet 6  . [DISCONTINUED] metoprolol (TOPROL-XL) 50 MG 24 hr tablet Take 50 mg by mouth daily.      . [DISCONTINUED] niacin (NIASPAN) 500 MG CR tablet Take 500 mg by mouth at bedtime.      . [DISCONTINUED] omeprazole (PRILOSEC) 20 MG capsule 1 po every morning 30 capsule 5     REVIEW OF SYSTEMS: Cardiovascular: No chest pain, chest pressure.  Pain over left arm fistula Pulmonary: No productive cough, asthma or wheezing. Neurologic: No weakness, paresthesias, aphasia, or amaurosis. No dizziness. Hematologic: No bleeding problems or clotting disorders. Musculoskeletal: No joint pain or joint swelling. Gastrointestinal: No blood in stool or hematemesis Genitourinary: No dysuria or hematuria. Psychiatric:: No history of major depression. Integumentary: No rashes or ulcers. Constitutional: No fever or chills.  PHYSICAL EXAMINATION:   Vital signs are  Filed Vitals:   02/12/15 2032 02/13/15 0514 02/13/15 0856 02/13/15 0859  BP: 131/80 140/75 178/79   Pulse: 83 70 47 69  Temp: 98.4 F (36.9 C) 97.8 F (36.6 C) 97.6 F (36.4 C)   TempSrc: Oral Oral Oral   Resp: 20 20 18    Height:      Weight:   161 lb (73.029 kg)   SpO2: 99% 100% 95% 100%   Body mass index is 20.12 kg/(m^2). General: The patient appears their stated age. HEENT:  No gross abnormalities Pulmonary:  Non labored breathing Neurologic: No focal weakness or paresthesias are detected, Skin: There are no ulcer or rashes noted. Psychiatric: The  patient has normal affect. Cardiovascular: There is a regular rate and rhythm without significant murmur appreciated. Palpable thrill within the fistula.  There is a hematoma which is soft.  He has no motor or sensory deficits in the arm  Assessment: End-stage renal disease Plan: Discussed putting a tunnel dialysis catheter and, in order to rest his left arm fistula.  I would not recommend evacuation of hematoma at this time.  There is no skin breakdown in the arm, and the area has not gotten larger.  Eldridge Abrahams, M.D. [facility] Office: [phone] Pager:  [phone]

## 2015-02-13 NOTE — Progress Notes (Signed)
Contacted [facility] regarding transport of patient back to [facility] for dialysis(per pt report this is the plan). [facility] contacted [facility] and patient is to return for dialysis treatment. [facility] to pick patient up from PACU at 1300.

## 2015-02-13 NOTE — Op Note (Signed)
    Patient name: [name] MRN: [id number] DOB: May 05, 1945 Sex: male  02/09/2015 - 02/13/2015 Pre-operative Diagnosis: End-stage renal disease  Post-operative diagnosis:  Same Surgeon:  [name] Assistants:  none Procedure:   #1: Central venogram.  #2: Ultrasound-guided insertion of left internal jugular vein tunneled dialysis catheter Anesthesia:  Mac Blood Loss:  See anesthesia record Specimens:  None  Findings:  Patent central veins  Indications:  The patient developed extravasation from fistula access and now needs a catheter  Procedure:  The patient was identified in the holding area and taken to [place] [facility]  The patient was then placed supine on the table. MAC anesthesia was administered.  The patient was prepped and draped in the usual sterile fashion.  A time out was called and antibiotics were administered.  I evaluated the right internal jugular vein.  This was very small secondary to previous catheter placement.  I tried to get into this with a micropuncture needle.  I was able to cannulate the vein but could not get the wire to advance.  I therefore elected to proceed with a left-sided placement.  The jugular vein and the left side was of excellent caliber and easily compressible.  It was cannulated with an 18-gauge needle.  I had difficulty advancing the wire into the central venous system and therefore I placed a Berenstein 2 catheter I was able to get the guidewire into the right atrium.  Sequential dilators were used to dilate the subcutaneous tract and the peel-away sheath was placed.  I inserted a 27 cm catheter.  The catheter did not go down into the right atrium but coiled up.  I therefore removed the catheter inserted a Berenstein catheter and used a Rosen wire which I placed into the inferior vena cava.  I then reinserted the 27 cm catheter.  This time it was in better position.  I did have to leave it on the deep side because I was afraid if I pulled it back it  would coil back on itself.  A skin exit site was selected.  11 blade was used to make a skin neck followed by subcutaneous tunnel.  The cath was brought through the tunnel and the cuff situated at the skin exit site.  Both ports flushed and aspirated without difficulty.  The catheter was sutured into position with a 3-0 nylon.  This incision the neck was closed with 4-0 Vicryl followed by Dermabond.  There were no immediate complications   Disposition:  To PACU in stable condition.   Theotis Burrow, M.D. [facility] [place] Office: [phone] Pager:  [phone]

## 2015-02-13 NOTE — Progress Notes (Signed)
PROGRESS NOTE  [name] [id number] DOB: 08-Feb-1946 DOA: 02/09/2015 PCP: [name], MD  Summary: 81yom PMH ESRD on HD MWF who had dizziness and low BP at outpatient HD 11/21. Was sent to ED where was found to have profound orthostasis resulting in syncope in ED. Admitted for orthostasis thought secondary to too much volume removed.   Assessment/Plan: 1. LUE large hematoma secondary to cannulation of AV fistula; hematoma resulting in brachial vein compression, AV fistula patent on u/s. Perfusion remains intact LUE. Discussed with VVS Dr. [name] 11/25; he examined arm and recommends supportive care; does not bleed patient has ongoing bleeding. H  2. ESRD MWF at [facility] in [place].   3. Orthostatic hypotension, resolved, secondary to bleeding and suspected excess volume removal with HD. 4. Anemia of CKD with superimposed ABLA secondary to hematoma. 5. COPD, stable. 6. Chronic systolic CHF, appears stable. 7. PMH stroke, AAA s/p AAA repair, CKD stage III, atrial fibarillation   Feels well; we discussed Hgb (pt, wife); PRBC transfusion suggested, they declined. Patient asymptomatic.   Now getting HD; RN reports bleeding at site of HD catheter placement. Hgb stable. Will monitor for now overnight.   Code Status: full code DVT prophylaxis: SCDs Family Communication: wife at bedside, daughter at bedside Disposition Plan: Anticipate discharge within 24 hours.   Murray Hodgkins, MD  [facility]  Pager [phone] If 7PM-7AM, please contact night-coverage at [email or web address], password [facility] 02/13/2015, 8:34 AM    Consultants:  VVS  Procedures:  11/25 #1: Central venogram. #2: Ultrasound-guided insertion of left internal jugular vein tunneled dialysis catheter  Antibiotics:    HPI/Subjective: Seen after return.  No changes to edema upper arm, some more edema lower arm. No left hand edema, numbness, pain or difficulty using. Breathing fine.  Objective: Filed  Vitals:   02/12/15 0647 02/12/15 1605 02/12/15 2032 02/13/15 0514  BP:  137/79 131/80 140/75  Pulse:  96 83 70  Temp:  98.4 F (36.9 C) 98.4 F (36.9 C) 97.8 F (36.6 C)  TempSrc:  Oral Oral Oral  Resp:  20 20 20   Height:      Weight: 73.12 kg (161 lb 3.2 oz)     SpO2:  95% 99% 100%    Intake/Output Summary (Last 24 hours) at 02/13/15 0834 Last data filed at 02/13/15 0513  Gross per 24 hour  Intake    480 ml  Output    200 ml  Net    280 ml     Filed Weights   02/09/15 1727 02/09/15 2130 02/12/15 0647  Weight: 72.576 kg (160 lb) 70.353 kg (155 lb 1.6 oz) 73.12 kg (161 lb 3.2 oz)    Exam: Afebrile, VSS, not hypoxic General:  Appears calm and comfortable Cardiovascular: RRR, no m/r/g. No LE edema. Respiratory: CTA bilaterally, no w/r/r. Normal respiratory effort. Skin: less edema left shoulder; fistula with thrill; bicep soft, without change in size; forearm edema noted. No hand/wrist edema. Strength and sensation in left hand normal. Perfusion appears normal. Strong left radial pulse. Psychiatric: grossly normal mood and affect, speech fluent and appropriate  New data reviewed:  Potassium WNL  Hgb 7.5, 7.6  BMP consistent with ESRD  Scheduled Meds: . aspirin EC  81 mg Oral Daily  . atorvastatin  80 mg Oral q1800  . carvedilol  3.125 mg Oral BID WC  . citalopram  10 mg Oral Daily  . ezetimibe  10 mg Oral Daily  . multivitamin  1 tablet Oral QHS  . pantoprazole  40 mg Oral Daily  . sodium chloride  3 mL Intravenous Q12H   Continuous Infusions: . sodium chloride      Principal Problem:   Orthostatic hypotension Active Problems:   End-stage renal disease on hemodialysis (HCC)   Traumatic hematoma of left upper arm   Anemia of chronic disease   Time 35 minutes, >50% in coordination of care   By signing my name below, I, Rennis Harding attest that this documentation has been prepared under the direction and in the presence of Murray Hodgkins,  MD Electronically signed: Rennis Harding  02/13/2015    I personally performed the services described in this documentation. All medical record entries made by the scribe were at my direction. I have reviewed the chart and agree that the record reflects my personal performance and is accurate and complete. Murray Hodgkins, MD

## 2015-02-13 NOTE — Anesthesia Postprocedure Evaluation (Signed)
Anesthesia Post Note  Patient: [name]  Procedure(s) Performed: Procedure(s) (LRB): INSERTION OF DIALYSIS CATHETER AND CENTRAL VENOGRAM (Left)  Patient location during evaluation: PACU Anesthesia Type: MAC Level of consciousness: awake and alert Pain management: pain level controlled Vital Signs Assessment: post-procedure vital signs reviewed and stable Respiratory status: spontaneous breathing and respiratory function stable Cardiovascular status: stable Anesthetic complications: no    Last Vitals:  Filed Vitals:   02/13/15 1117 02/13/15 1130  BP: 111/69 129/79  Pulse: 63 63  Temp: 36.3 C   Resp: 11 11    Last Pain:  Filed Vitals:   02/13/15 1136  PainSc: [place]

## 2015-02-14 DIAGNOSIS — N186 End stage renal disease: Secondary | ICD-10-CM | POA: Diagnosis not present

## 2015-02-14 DIAGNOSIS — I951 Orthostatic hypotension: Secondary | ICD-10-CM | POA: Diagnosis not present

## 2015-02-14 DIAGNOSIS — D638 Anemia in other chronic diseases classified elsewhere: Secondary | ICD-10-CM | POA: Diagnosis not present

## 2015-02-14 DIAGNOSIS — D62 Acute posthemorrhagic anemia: Secondary | ICD-10-CM

## 2015-02-14 DIAGNOSIS — S40022A Contusion of left upper arm, initial encounter: Secondary | ICD-10-CM | POA: Diagnosis not present

## 2015-02-14 LAB — PROTIME-INR
INR: 1.17 (ref 0.00–1.49)
PROTHROMBIN TIME: 15.1 s (ref 11.6–15.2)

## 2015-02-14 LAB — BASIC METABOLIC PANEL
Anion gap: 13 (ref 5–15)
BUN: 31 mg/dL — ABNORMAL HIGH (ref 6–20)
CHLORIDE: 96 mmol/L — AB (ref 101–111)
CO2: 27 mmol/L (ref 22–32)
CREATININE: 6 mg/dL — AB (ref 0.61–1.24)
Calcium: 8.6 mg/dL — ABNORMAL LOW (ref 8.9–10.3)
GFR calc non Af Amer: 9 mL/min — ABNORMAL LOW (ref >60)
GFR, EST AFRICAN AMERICAN: 10 mL/min — AB (ref >60)
GLUCOSE: 101 mg/dL — AB (ref 65–99)
Potassium: 3.9 mmol/L (ref 3.5–5.1)
Sodium: 136 mmol/L (ref 135–145)

## 2015-02-14 LAB — CBC
HCT: 22.4 % — ABNORMAL LOW (ref 39.0–52.0)
Hemoglobin: 7.7 g/dL — ABNORMAL LOW (ref 13.0–17.0)
MCH: 34.5 pg — AB (ref 26.0–34.0)
MCHC: 34.4 g/dL (ref 30.0–36.0)
MCV: 100.4 fL — ABNORMAL HIGH (ref 78.0–100.0)
PLATELETS: 137 10*3/uL — AB (ref 150–400)
RBC: 2.23 MIL/uL — ABNORMAL LOW (ref 4.22–5.81)
RDW: 15.6 % — AB (ref 11.5–15.5)
WBC: 7.1 10*3/uL (ref 4.0–10.5)

## 2015-02-14 LAB — HEPATITIS B SURFACE ANTIGEN: HEP B S AG: NEGATIVE

## 2015-02-14 LAB — APTT: aPTT: 32 seconds (ref 24–37)

## 2015-02-14 NOTE — Progress Notes (Signed)
Pt returned to unit from dialysis at 2030.  Requested PRN for L arm pain.

## 2015-02-14 NOTE — Progress Notes (Signed)
Subjective: Patient does not have any complaints. He denies any pain of his arm. Patient also denies any difficulty breathing. At this moment he doesn't have any dizziness or lightheadedness.  Objective: Vital signs in last 24 hours: Temp:  [97.2 F (36.2 C)-99.6 F (37.6 C)] 99.6 F (37.6 C) (11/26 0500) Pulse Rate:  [54-103] 77 (11/26 0500) Resp:  [10-20] 20 (11/26 0500) BP: (86-186)/(57-95) 165/88 mmHg (11/26 0500) SpO2:  [97 %-100 %] 100 % (11/26 0500) Weight:  [162 lb 7.7 oz (73.7 kg)] 162 lb 7.7 oz (73.7 kg) (11/25 1535)  Intake/Output from previous day: 11/25 0701 - 11/26 0700 In: 150 [I.V.:150] Out: 1000  Intake/Output this shift:     Recent Labs  02/11/15 1339 02/12/15 0626 02/13/15 0552 02/13/15 1730 02/14/15 0648  HGB 9.0* 8.7* 7.5* 7.6* 7.7*    Recent Labs  02/13/15 1730 02/14/15 0648  WBC 7.3 7.1  RBC 2.19* 2.23*  HCT 22.1* 22.4*  PLT 145* 137*    Recent Labs  02/12/15 0626 02/13/15 0552  NA 137 138  K 4.5 4.7  CL 99* 99*  CO2 28 26  BUN 54* 66*  CREATININE 9.11* 9.84*  GLUCOSE 100* 86  CALCIUM 8.8* 8.3*    Recent Labs  02/14/15 0648  INR 1.17    Generally patient is alert and in no apparent distress. Chest is clear to auscultation Heart exam revealed regular rate and rhythm no murmur Extremities no edema. His left arm at this moment seems to be more or less the same. His fistula still functioning.  Assessment/Plan: Problem #1 end-stage renal disease: He is status post hemodialysis yesterday and presently he is asymptomatic. Problem #2 hypertension: His blood pressure is reasonably controlled. Patient had one episode of hypotension on dialysis. He was however asymptomatic. Problem #3 history of dizziness: is better Problem #4 hematoma of his left arm with his fistula. Patient is still with some swelling but no tenderness. Patient was evaluated by vascular surgery and that this moment to want to do any surgical intervention. Problem  #5 anemia: His hemoglobin is low but stable. Problem #6 history of coronary artery disease and cardiomyopathy wi Problem #7 fluid management: Patient does not have any significant sign of fluid overload Problem #8 metabolic bone disease calcium is in range. Plan:1] patient will go back to his regular dialysis unit for his regular scheduled. 2] will follow his arm swelling.   Vinton Layson S 02/14/2015, 9:51 AM

## 2015-02-14 NOTE — Progress Notes (Signed)
PROGRESS NOTE  [name] [id number] DOB: 07-12-1945 DOA: 02/09/2015 PCP: [name], MD  Summary: 4yom PMH ESRD on HD MWF who had dizziness and low BP at outpatient HD 11/21. Was sent to ED where was found to have profound orthostasis resulting in syncope in ED. Admitted for orthostasis thought secondary to too much volume removed.   Assessment/Plan: 1. LUE large hematoma secondary to cannulation of AV fistula; hematoma resulting in brachial vein compression, AV fistula patent on u/s. Perfusion remains intact LUE. Discussed with VVS Dr. [name] 11/25; he examined arm and recommended supportive care; did not believe patient has ongoing bleeding. Overall improving. 2. ESRD. Receives HD MWF at [facility] in [place]. S/p HD 11/25. 3. Orthostatic hypotension, resolved, secondary to bleeding and suspected excess volume removal with HD. 4. Anemia of CKD with superimposed ABLA secondary to hematoma. Hgb stable at 7.7. Discussed option of transfusion with the patient and family who declined 11/25. Discussed with Dr. Lowanda Foster, he will follow as outpatient. 5. COPD, stable. 6. Chronic systolic CHF, appears stable. 7. PMH stroke, AAA s/p AAA repair, CKD stage III, atrial fibarillation   Overall improved. Discharge home today  Continue HD MWF.  Code Status: full code DVT prophylaxis: SCDs Family Communication: none. Discussed with patient who understands and has no concerns at this time. Disposition Plan: Discharge today.   Murray Hodgkins, MD  [facility]  Pager [phone] If 7PM-7AM, please contact night-coverage at [email or web address], password [facility] 02/14/2015, 6:30 AM    Consultants:  VVS  Procedures:  11/25 #1: Central venogram. #2: Ultrasound-guided insertion of left internal jugular vein tunneled dialysis catheter  Antibiotics:    HPI/Subjective: Feels good. Has less arm pain but states it is still swollen.   Objective: Filed Vitals:   02/13/15 1915 02/13/15  1930 02/13/15 2143 02/14/15 0500  BP: 126/79 130/69 127/68 165/88  Pulse: 78 76 103 77  Temp:   99.2 F (37.3 C) 99.6 F (37.6 C)  TempSrc:   Oral Oral  Resp:   18 20  Height:      Weight:      SpO2:   97% 100%    Intake/Output Summary (Last 24 hours) at 02/14/15 0630 Last data filed at 02/13/15 1940  Gross per 24 hour  Intake    150 ml  Output   1000 ml  Net   -850 ml     Filed Weights   02/12/15 0647 02/13/15 0856 02/13/15 1535  Weight: 73.12 kg (161 lb 3.2 oz) 73.029 kg (161 lb) 73.7 kg (162 lb 7.7 oz)    Exam: Afebrile, VSS, not hypoxic General:  Appears calm and comfortable Cardiovascular: RRR, no m/r/g. No LE edema. Respiratory: CTA bilaterally, no w/r/r. Normal respiratory effort. Musculoskeletal:  LUE has more bruising to the upper arm, less shoulder and bicep edema (although still quite a bit), less forearm edema. Left hand is uninvolved with normal perfusion and radial pulse.  Psychiatric: grossly normal mood and affect, speech fluent and appropriate  New data reviewed:  BMP consistent with ESRD  Hgb 7.7, Platelets 137  CBG stable  INR 1.17  Scheduled Meds: . atorvastatin  80 mg Oral q1800  . carvedilol  3.125 mg Oral BID WC  . citalopram  10 mg Oral Daily  . epoetin (EPOGEN/PROCRIT) injection  10,000 Units Intravenous Q M,W,F-HD  . ezetimibe  10 mg Oral Daily  . multivitamin  1 tablet Oral QHS  . pantoprazole  40 mg Oral Daily  . sodium chloride  3 mL Intravenous  Q12H   Continuous Infusions: . sodium chloride 10 mL/hr at 02/13/15 [id number]    Principal Problem:   Orthostatic hypotension Active Problems:   End-stage renal disease on hemodialysis [facility])   Traumatic hematoma of left upper arm   Anemia of chronic disease    By signing my name below, I, Rosalie Doctor attest that this documentation has been prepared under the direction and in the presence of Murray Hodgkins, MD Electronically signed: Rosalie Doctor, Scribe. 02/14/2015  10:10am  I personally performed the services described in this documentation. All medical record entries made by the scribe were at my direction. I have reviewed the chart and agree that the record reflects my personal performance and is accurate and complete. Murray Hodgkins, MD

## 2015-02-14 NOTE — Discharge Summary (Signed)
Physician Discharge Summary  [name] [id number] DOB: June 02, 1945 DOA: 02/09/2015  PCP: Greg Lange, MD  Admit date: 02/09/2015 Discharge date: 02/14/2015  Recommendations for Outpatient Follow-up:  1. Follow up LUE hematoma and acute on chronic anemia. Dr. Lowanda Foster will monitor via HD sessions.   Follow-up Information    Follow up with Greg Major, MD.   Specialties:  Vascular Surgery, Cardiology   Why:  As needed   Contact information:   [place] [address] [phone]       Follow up with Greg Lange, MD.   Specialty:  Family Medicine   Why:  As needed   Contact information:   [name] [id number] [phone]      Discharge Diagnoses:  1. LUE large hematoma secondary to cannulation of AV fistula; hematoma resulting in brachial vein compression 2. Orthostatic hypotension secondary to ABLA, hematoma 3. Anemia of CKD with superimposed ABLA secondary to hematoma. 4. COPD 5. Chronic systolic CHF 6. PMH stroke, AAA s/p AAA repair, CKD stage III, atrial fibrillation  Discharge Condition: Improved  Disposition: Home  Diet recommendation: Heart healthy  Filed Weights   02/12/15 0647 02/13/15 0856 02/13/15 1535  Weight: 73.12 kg (161 lb 3.2 oz) 73.029 kg (161 lb) 73.7 kg (162 lb 7.7 oz)    History of present illness:  69yom PMH ESRD on HD MWF who had dizziness and low BP at outpatient HD 11/21. Was sent to ED where was found to have profound orthostasis resulting in syncope in ED. Admitted for orthostasis thought secondary to too much volume removed.  Hospital Course:  [name] was admitted for hypotension thought secondary to excess volume removal with HD. Blood pressure normalized after IVF. During hospitalization, he was noted to have LUE edema and pain. Venous doppler revealed large hematoma in the left upper arm with compression of left branchial vein. Case was discussed with Dr. [name], [facility] who felt no  intervention was needed given normal perfusion. Recommended temporary HD catheter while hematoma resolves. Catheter was placed at [facility] 11/25 and surgeon examined LUE at that time; recommended supportive care. [name] received HD 11/25 on return to [facility], thought had bleeding from catheter site.  Discussed the option of a transfusion with the patient and family however they declined.  Hospitalization was prolonged by limited availability of catheter placement secondary to Thanksgiving, monitoring of arm and perfusion and need for HD as inpatient, as outpatient session was missed.  1. LUE large hematoma secondary to cannulation of AV fistula; hematoma resulting in brachial vein compression, AV fistula patent on u/s. Perfusion remains intact LUE. Discussed with [facility] Dr. [name] 11/25; he examined arm and recommended supportive care; did not believe patient has ongoing bleeding. Overall improving. 2. ESRD. Receives HD MWF at [facility] [place]. S/p HD 11/25. 3. Orthostatic hypotension, resolved, secondary to bleeding and suspected excess volume removal with HD. 4. Anemia of CKD with superimposed ABLA secondary to hematoma. Hgb stable at 7.7. Discussed option of transfusion with the patient and family who declined 11/25. Discussed with Dr. Lowanda Foster, he will follow as outpatient. 5. COPD, stable. 6. Chronic systolic CHF, appears stable. 7. PMH stroke, AAA s/p AAA repair, CKD stage III, atrial fibarillation   Consultants:  Nephrology   Procedures:  11/25 #1: Central venogram. #2: Ultrasound-guided insertion of left internal jugular vein tunneled dialysis catheter   Discharge Instructions  Discharge Instructions    Activity as tolerated - No restrictions    Complete by:  As directed      Call MD for:  redness, tenderness, or signs of infection (pain, swelling, bleeding, redness, odor or green/yellow discharge around incision site)    Complete by:  As directed      Call MD for:  severe  or increased pain, loss or decreased feeling  in affected limb(s)    Complete by:  As directed      Call MD for:  temperature >100.5    Complete by:  As directed      Diet - low sodium heart healthy    Complete by:  As directed      Discharge instructions    Complete by:  As directed   Keep the catheter site clean and dry     Discharge instructions    Complete by:  As directed   Call physician or seek immediate medical attention for increased pain, numbness in arm, bleeding, swelling of legs, difficulty using left arm or worsening of condition.          Discharge Medication List as of 02/14/2015 11:39 AM    CONTINUE these medications which have NOT CHANGED   Details  aspirin EC 81 MG tablet Take 81 mg by mouth daily., Until Discontinued, Historical Med    atorvastatin (LIPITOR) 80 MG tablet TAKE 1 TABLET (80 MG TOTAL) BY MOUTH DAILY AT 6 PM., Normal    b complex-vitamin c-folic acid (NEPHRO-VITE) 0.8 MG TABS tablet Take 1 tablet by mouth daily., Starting 12/15/2014, Until Discontinued, Historical Med    carvedilol (COREG) 3.125 MG tablet Take 1 tablet (3.125 mg total) by mouth 2 (two) times daily., Starting 1/70/2016, Until Discontinued, Normal    citalopram (CELEXA) 10 MG tablet TAKE 1 TABLET BY MOUTH EVERY DAY, Normal    lidocaine-prilocaine (EMLA) cream Apply 1 application topically as needed., Starting 12/04/2014, Until Discontinued, Historical Med    pantoprazole (PROTONIX) 40 MG tablet Take 1 tablet (40 mg total) by mouth daily., Starting 02/19/2014, Until Discontinued, Normal    polyethylene glycol (MIRALAX / GLYCOLAX) packet Take 17 g by mouth daily., Starting 11/29/2013, Until Discontinued, Print    VOLTAREN 1 % GEL Apply 4 g topically daily as needed (for knee pain). Bilateral Knee, Starting 12/07/2013, Until Discontinued, Historical Med    ZETIA 10 MG tablet TAKE 1 TABLET (10 MG TOTAL) BY MOUTH DAILY., Normal       No Known Allergies  The results of significant  diagnostics from this hospitalization (including imaging, microbiology, ancillary and laboratory) are listed below for reference.    Significant Diagnostic Studies: US Venous Img Upper Uni Left  02/10/2015  CLINICAL DATA:  edema x2 days. Pain. Left dialysis fistula in place. EXAM: UPPER EXTREMITY VENOUS DOPPLER ULTRASOUND TECHNIQUE: Gray-scale sonography with graded compression, as well as color Doppler and duplex ultrasound were performed to evaluate the upper extremity deep venous system from the level of the subclavian vein and including the jugular, axillary, basilic and upper cephalic vein. Spectral Doppler was utilized to evaluate flow at rest and with distal augmentation maneuvers. COMPARISON:  None. FINDINGS: Patent brachial artery to cephalic vein fistula. Normal flow signal and compressibility of the IJ vein. Normal flow signal and color Doppler in the visualized segments of the subclavian vein. Normal color signal and compressibility of axillary vein. There is a large deep subcutaneous or intramuscular hematoma in the left upper arm measuring approximately 17.6 x 4 x8.2 cm. There is resultant compression of brachial vein. Ulnar and radial veins demonstrate minimal antegrade flow signal  on color Doppler. Limited images of the contralateral right subclavian vein are unremarkable. IMPRESSION: 1. Large hematoma in the left upper arm with compression of left brachial vein. 2. No evidence of left upper extremity DVT. 3. Patent left upper arm AV fistula. Electronically Signed   By: Lucrezia Europe M.D.   On: 02/10/2015 16:20   Dg Chest Port 1 View  02/09/2015  CLINICAL DATA:  Syncope episodes today during dialysis. Intermittent dizziness. COPD. EXAM: PORTABLE CHEST 1 VIEW COMPARISON:  01/08/2015 FINDINGS: Upper normal heart size. No edema noted. There is some indistinct bandlike opacity at the right lung base probably reflecting mild atelectasis. The lungs appear otherwise clear. No pleural effusion.  IMPRESSION: 1. Bandlike right infrahilar and right basilar opacity, probably from atelectasis. Otherwise, no significant abnormalities are observed. Electronically Signed   By: Van Clines M.D.   On: 02/09/2015 19:29    Microbiology: Recent Results (from the past 240 hour(s))  Surgical pcr screen     Status: None   Collection Time: 02/12/15 11:00 PM  Result Value Ref Range Status   MRSA, PCR NEGATIVE NEGATIVE Final   Staphylococcus aureus NEGATIVE NEGATIVE Final    Comment:        The Xpert SA Assay (FDA approved for NASAL specimens in patients over 76 years of age), is one component of a comprehensive surveillance program.  Test performance has been validated by [facility] for patients greater than or equal to 49 year old. It is not intended to diagnose infection nor to guide or monitor treatment.      Labs: Basic Metabolic Panel:  Recent Labs Lab 02/10/15 0616 02/11/15 1339 02/12/15 0626 02/13/15 0552 02/14/15 0849  NA 140 137 137 138 136  K 4.0 4.2 4.5 4.7 3.9  CL 98* 97* 99* 99* 96*  CO2 31 29 28 26 27   GLUCOSE 94 113* 100* 86 101*  BUN 27* 44* 54* 66* 31*  CREATININE 5.80* 8.28* 9.11* 9.84* 6.00*  CALCIUM 8.8* 8.6* 8.8* 8.3* 8.6*  PHOS  --   --   --  5.8*  5.7*  --    Liver Function Tests:  Recent Labs Lab 02/09/15 1825 02/10/15 0616 02/13/15 0552  AST 22 19  --   ALT 15* 14*  --   ALKPHOS 95 86  --   BILITOT 0.6 0.9  --   PROT 8.3* 7.5  --   ALBUMIN 4.2 3.9 3.5  CBC:  Recent Labs Lab 02/09/15 1825  02/11/15 1339 02/12/15 0626 02/13/15 0552 02/13/15 1730 02/14/15 0648  WBC 6.1  < > 7.4 7.5 5.9 7.3 7.1  NEUTROABS 4.6  --   --   --   --   --   --   HGB 11.7*  < > 9.0* 8.7* 7.5* 7.6* 7.7*  HCT 35.1*  < > 26.9* 25.7* 22.0* 22.1* 22.4*  MCV 102.9*  < > 103.1* 102.4* 101.9* 100.9* 100.4*  PLT 171  < > 150 143* 145* 145* 137*  < > = values in this interval not displayed.   Principal Problem:   Traumatic hematoma of left upper  arm Active Problems:   End-stage renal disease on hemodialysis (HCC)   Orthostatic hypotension   Anemia of chronic disease   Time coordinating discharge: 25 minutes  Signed:  Murray Hodgkins, MD [facility] 02/14/2015, 6:34 AM    By signing my name below, I, Rosalie Doctor attest that this documentation has been prepared under the direction and in the presence of Murray Hodgkins,  MD Electronically signed: Rosalie Doctor, Scribe.  02/14/2015  I personally performed the services described in this documentation. All medical record entries made by the scribe were at my direction. I have reviewed the chart and agree that the record reflects my personal performance and is accurate and complete. Murray Hodgkins, MD

## 2015-02-16 ENCOUNTER — Encounter (HOSPITAL_COMMUNITY): Payer: Self-pay | Admitting: Surgery

## 2015-03-03 ENCOUNTER — Encounter: Payer: Self-pay | Admitting: Family Medicine

## 2015-03-03 ENCOUNTER — Ambulatory Visit (INDEPENDENT_AMBULATORY_CARE_PROVIDER_SITE_OTHER): Payer: Medicare Other | Admitting: Family Medicine

## 2015-03-03 VITALS — BP 124/80 | Ht 75.0 in | Wt 161.1 lb

## 2015-03-03 DIAGNOSIS — E785 Hyperlipidemia, unspecified: Secondary | ICD-10-CM

## 2015-03-03 DIAGNOSIS — S40022D Contusion of left upper arm, subsequent encounter: Secondary | ICD-10-CM | POA: Diagnosis not present

## 2015-03-03 DIAGNOSIS — N185 Chronic kidney disease, stage 5: Secondary | ICD-10-CM

## 2015-03-03 NOTE — Progress Notes (Signed)
   Subjective:    Patient ID: [name], male    DOB: 1945-09-09, 69 y.o.   MRN: [id number]  Hyperlipidemia This is a chronic problem. The current episode started more than 1 year ago. There are no known factors aggravating his hyperlipidemia. Current antihyperlipidemic treatment includes statins. The current treatment provides moderate improvement of lipids. There are no compliance problems.  There are no known risk factors for coronary artery disease.   Patient has no concerns at this time.  Long discussion held about dialysis having some postdialysis hypotension frustrations of poor communication at dialysis as well as a hematoma that occurred in recent hospitalization review over the labs and ultrasound were completed during today's visit to cover substantial portion of the time Review of Systems Patient denies any chest tightness pressure pain shortness of breath he does relate weakness tiredness low appetite and left arm hematoma    Objective:   Physical Exam  Neck no masses catheter noted in the left upper chest lungs are clear no crackles heart is regular abdomen soft then extremities no edema large hematoma noted in the left bicep  25 minutes was spent with the patient. Greater than half the time was spent in discussion and answering questions and counseling regarding the issues that the patient came in for today.     Assessment & Plan:  Patient has significant vascular dementia. It would be best for her wife to be given a briefing every time he goes to dialysis so that she will be aware what when on  We discussed at length low blood pressures his blood pressure today laying sitting standing are good around about 138/80 the importance of monitoring blood pressure after dialysis discussed.  Large hematoma left arm hopefully this or resolve over the next 3 months and he can start using his port on that side but he will follow-up with vascular and vein specialist  Hyperlipidemia  he will check lab work continue medication.

## 2015-03-05 ENCOUNTER — Other Ambulatory Visit: Payer: Self-pay | Admitting: Gastroenterology

## 2015-03-06 ENCOUNTER — Ambulatory Visit (INDEPENDENT_AMBULATORY_CARE_PROVIDER_SITE_OTHER): Payer: Medicare Other | Admitting: Urology

## 2015-03-06 DIAGNOSIS — N509 Disorder of male genital organs, unspecified: Secondary | ICD-10-CM | POA: Diagnosis not present

## 2015-03-06 DIAGNOSIS — N500 Atrophy of testis: Secondary | ICD-10-CM | POA: Diagnosis not present

## 2015-03-19 ENCOUNTER — Other Ambulatory Visit: Payer: Self-pay | Admitting: Family Medicine

## 2015-03-26 LAB — HEPATIC FUNCTION PANEL
ALT: 12 IU/L (ref 0–44)
AST: 20 IU/L (ref 0–40)
Albumin: 4.3 g/dL (ref 3.5–4.8)
Alkaline Phosphatase: 96 IU/L (ref 39–117)
BILIRUBIN, DIRECT: 0.16 mg/dL (ref 0.00–0.40)
Bilirubin Total: 0.5 mg/dL (ref 0.0–1.2)
Total Protein: 7.3 g/dL (ref 6.0–8.5)

## 2015-03-26 LAB — LIPID PANEL
CHOL/HDL RATIO: 2.8 ratio (ref 0.0–5.0)
Cholesterol, Total: 147 mg/dL (ref 100–199)
HDL: 52 mg/dL (ref >39)
LDL Calculated: 82 mg/dL (ref 0–99)
TRIGLYCERIDES: 65 mg/dL (ref 0–149)
VLDL Cholesterol Cal: 13 mg/dL (ref 5–40)

## 2015-03-27 ENCOUNTER — Encounter: Payer: Self-pay | Admitting: Family Medicine

## 2015-03-30 ENCOUNTER — Other Ambulatory Visit: Payer: Self-pay | Admitting: *Deleted

## 2015-04-07 ENCOUNTER — Encounter (HOSPITAL_COMMUNITY): Payer: Self-pay | Admitting: Vascular Surgery

## 2015-04-07 ENCOUNTER — Ambulatory Visit (HOSPITAL_COMMUNITY)
Admission: RE | Admit: 2015-04-07 | Discharge: 2015-04-07 | Disposition: A | Discharge status: Home / Self Care | Payer: Medicare Other | Source: Ambulatory Visit | Attending: Surgery | Admitting: Surgery

## 2015-04-07 ENCOUNTER — Other Ambulatory Visit: Payer: Self-pay

## 2015-04-07 ENCOUNTER — Encounter (HOSPITAL_COMMUNITY)
Admission: RE | Disposition: A | Discharge status: Home / Self Care | Payer: Self-pay | Source: Ambulatory Visit | Attending: Surgery

## 2015-04-07 DIAGNOSIS — I4891 Unspecified atrial fibrillation: Secondary | ICD-10-CM | POA: Diagnosis not present

## 2015-04-07 DIAGNOSIS — Z8249 Family history of ischemic heart disease and other diseases of the circulatory system: Secondary | ICD-10-CM | POA: Diagnosis not present

## 2015-04-07 DIAGNOSIS — H538 Other visual disturbances: Secondary | ICD-10-CM | POA: Diagnosis not present

## 2015-04-07 DIAGNOSIS — Z992 Dependence on renal dialysis: Secondary | ICD-10-CM | POA: Diagnosis not present

## 2015-04-07 DIAGNOSIS — I429 Cardiomyopathy, unspecified: Secondary | ICD-10-CM | POA: Insufficient documentation

## 2015-04-07 DIAGNOSIS — F1010 Alcohol abuse, uncomplicated: Secondary | ICD-10-CM | POA: Insufficient documentation

## 2015-04-07 DIAGNOSIS — F329 Major depressive disorder, single episode, unspecified: Secondary | ICD-10-CM | POA: Diagnosis not present

## 2015-04-07 DIAGNOSIS — Z87891 Personal history of nicotine dependence: Secondary | ICD-10-CM | POA: Insufficient documentation

## 2015-04-07 DIAGNOSIS — J449 Chronic obstructive pulmonary disease, unspecified: Secondary | ICD-10-CM | POA: Diagnosis not present

## 2015-04-07 DIAGNOSIS — I252 Old myocardial infarction: Secondary | ICD-10-CM | POA: Insufficient documentation

## 2015-04-07 DIAGNOSIS — D649 Anemia, unspecified: Secondary | ICD-10-CM | POA: Diagnosis not present

## 2015-04-07 DIAGNOSIS — T82898A Other specified complication of vascular prosthetic devices, implants and grafts, initial encounter: Secondary | ICD-10-CM

## 2015-04-07 DIAGNOSIS — E785 Hyperlipidemia, unspecified: Secondary | ICD-10-CM | POA: Insufficient documentation

## 2015-04-07 DIAGNOSIS — F419 Anxiety disorder, unspecified: Secondary | ICD-10-CM | POA: Insufficient documentation

## 2015-04-07 DIAGNOSIS — I714 Abdominal aortic aneurysm, without rupture: Secondary | ICD-10-CM | POA: Diagnosis not present

## 2015-04-07 DIAGNOSIS — Y832 Surgical operation with anastomosis, bypass or graft as the cause of abnormal reaction of the patient, or of later complication, without mention of misadventure at the time of the procedure: Secondary | ICD-10-CM | POA: Insufficient documentation

## 2015-04-07 DIAGNOSIS — Z7982 Long term (current) use of aspirin: Secondary | ICD-10-CM | POA: Insufficient documentation

## 2015-04-07 DIAGNOSIS — I2510 Atherosclerotic heart disease of native coronary artery without angina pectoris: Secondary | ICD-10-CM | POA: Diagnosis not present

## 2015-04-07 DIAGNOSIS — I132 Hypertensive heart and chronic kidney disease with heart failure and with stage 5 chronic kidney disease, or end stage renal disease: Secondary | ICD-10-CM | POA: Diagnosis not present

## 2015-04-07 DIAGNOSIS — N186 End stage renal disease: Secondary | ICD-10-CM | POA: Diagnosis not present

## 2015-04-07 DIAGNOSIS — I69398 Other sequelae of cerebral infarction: Secondary | ICD-10-CM | POA: Diagnosis not present

## 2015-04-07 DIAGNOSIS — M1990 Unspecified osteoarthritis, unspecified site: Secondary | ICD-10-CM | POA: Insufficient documentation

## 2015-04-07 DIAGNOSIS — I5022 Chronic systolic (congestive) heart failure: Secondary | ICD-10-CM | POA: Diagnosis not present

## 2015-04-07 DIAGNOSIS — F0150 Vascular dementia without behavioral disturbance: Secondary | ICD-10-CM | POA: Diagnosis present

## 2015-04-07 HISTORY — PX: PERIPHERAL VASCULAR CATHETERIZATION: SHX172C

## 2015-04-07 LAB — POCT I-STAT, CHEM 8
BUN: 28 mg/dL — AB (ref 6–20)
CALCIUM ION: 1.08 mmol/L — AB (ref 1.13–1.30)
CREATININE: 4.8 mg/dL — AB (ref 0.61–1.24)
Chloride: 100 mmol/L — ABNORMAL LOW (ref 101–111)
GLUCOSE: 81 mg/dL (ref 65–99)
HCT: 39 % (ref 39.0–52.0)
Hemoglobin: 13.3 g/dL (ref 13.0–17.0)
Potassium: 4.3 mmol/L (ref 3.5–5.1)
SODIUM: 139 mmol/L (ref 135–145)
TCO2: 26 mmol/L (ref 0–100)

## 2015-04-07 SURGERY — A/V SHUNTOGRAM/FISTULAGRAM
Anesthesia: LOCAL

## 2015-04-07 MED ORDER — SODIUM CHLORIDE 0.9 % IJ SOLN: 3.0000 mL | INTRAMUSCULAR | Status: DC | PRN

## 2015-04-07 MED ORDER — SODIUM CHLORIDE 0.9 % IJ SOLN: 3.0000 mL | Freq: Two times a day (BID) | INTRAMUSCULAR | Status: DC

## 2015-04-07 MED ORDER — ACETAMINOPHEN 325 MG PO TABS: 650.0000 mg | ORAL_TABLET | ORAL | Status: DC | PRN

## 2015-04-07 MED ORDER — HEPARIN SODIUM (PORCINE) 1000 UNIT/ML IJ SOLN
INTRAMUSCULAR | Status: AC
  Filled 2015-04-07: qty 1

## 2015-04-07 MED ORDER — LIDOCAINE HCL (PF) 1 % IJ SOLN
INTRAMUSCULAR | Status: AC
  Filled 2015-04-07: qty 30

## 2015-04-07 MED ORDER — HEPARIN (PORCINE) IN NACL 2-0.9 UNIT/ML-% IJ SOLN
INTRAMUSCULAR | Status: AC
  Filled 2015-04-07: qty 500

## 2015-04-07 MED ORDER — SODIUM CHLORIDE 0.9 % IV SOLN: 250.0000 mL | INTRAVENOUS | Status: DC | PRN

## 2015-04-07 MED ORDER — FENTANYL CITRATE (PF) 100 MCG/2ML IJ SOLN
INTRAMUSCULAR | Status: DC | PRN
  Administered 2015-04-07: 50 ug via INTRAVENOUS

## 2015-04-07 MED ORDER — FENTANYL CITRATE (PF) 100 MCG/2ML IJ SOLN
INTRAMUSCULAR | Status: AC
  Filled 2015-04-07: qty 2

## 2015-04-07 MED ORDER — HEPARIN SODIUM (PORCINE) 1000 UNIT/ML IJ SOLN
INTRAMUSCULAR | Status: DC | PRN
Start: 2015-04-07 — End: 2015-04-07
  Administered 2015-04-07: 3000 [IU] via INTRAVENOUS

## 2015-04-07 MED ORDER — IODIXANOL 320 MG/ML IV SOLN
INTRAVENOUS | Status: DC | PRN
  Administered 2015-04-07: 55 mL via INTRAVENOUS

## 2015-04-07 MED ORDER — LIDOCAINE HCL (PF) 1 % IJ SOLN
INTRAMUSCULAR | Status: DC | PRN
  Administered 2015-04-07: 10 mL

## 2015-04-07 SURGICAL SUPPLY — 14 items
BALLN MUSTANG 6X60X75 (BALLOONS) ×2
BALLOON MUSTANG 6X60X75 (BALLOONS) ×1 IMPLANT
COVER DOME SNAP 22 D (MISCELLANEOUS) ×2 IMPLANT
COVER PRB 48X5XTLSCP FOLD TPE (BAG) ×1 IMPLANT
COVER PROBE 5X48 (BAG) ×2
KIT ENCORE 26 ADVANTAGE (KITS) ×1 IMPLANT
KIT MICROINTRODUCER STIFF 5F (SHEATH) ×2 IMPLANT
PROTECTION STATION PRESSURIZED (MISCELLANEOUS) ×2
SHEATH PINNACLE R/O II 6F 4CM (SHEATH) ×1 IMPLANT
STATION PROTECTION PRESSURIZED (MISCELLANEOUS) ×1 IMPLANT
STOPCOCK MORSE 400PSI 3WAY (MISCELLANEOUS) ×2 IMPLANT
TRAY PV CATH (CUSTOM PROCEDURE TRAY) ×2 IMPLANT
TUBING CIL FLEX 10 FLL-RA (TUBING) ×2 IMPLANT
WIRE BENTSON .035X145CM (WIRE) ×2 IMPLANT

## 2015-04-07 NOTE — Discharge Instructions (Signed)
fistula Angioplasty, Care After Refer to this sheet in the next few weeks. These instructions provide you with information about caring for yourself after your procedure. Your health care provider may also give you more specific instructions. Your treatment has been planned according to current medical practices, but problems sometimes occur. Call your health care provider if you have any problems or questions after your procedure. WHAT TO EXPECT AFTER THE PROCEDURE After your procedure, it is typical to have the following:  Bruising at the catheter site that usually fades within 1-2 weeks.  Blood collecting in the tissue (hematoma) that may be painful to the touch. It should usually decrease in size and tenderness within 1-2 weeks. HOME CARE INSTRUCTIONS  Take medicines only as directed by your health care provider. Blood thinners may be prescribed after your procedure to improve blood flow.  You may shower 24-48 hours after the procedure or as directed by your health care provider. Remove the bandage (dressing) and gently wash the site with plain soap and water. Pat the area dry with a clean towel. Do not rub the site, because this may cause bleeding.  Do not take baths, swim, or use a hot tub until your health care provider approves.  Check your insertion site every day for redness, swelling, or drainage.  Do not apply powder or lotion to the site.  Do not lift over 10 lb (4.5 kg) for 5 days after your procedure or as directed by your health care provider.  Ask your health care provider when it is okay to:  Return to work or school.  Resume usual physical activities or sports.  Resume sexual activity.  Eat a heart-healthy diet. This should include plenty of fresh fruits and vegetables. Meat should be lean cuts. Avoid the following types of food:  Food that is high in salt.  Canned or highly processed food.  Food that is high in saturated fat or sugar.  Fried food.  Make any  other lifestyle changes as recommended by your health care provider. This may include:  Not using any tobacco products, including cigarettes, chewing tobacco, or electronic cigarettes. If you need help quitting, ask your health care provider.  Managing your weight.  Getting regular exercise.  Managing your blood pressure.  Limiting your alcohol intake.  Managing other health problems, such as diabetes.  Keep all follow-up visits as directed by your health care provider. This is important. SEEK MEDICAL CARE IF:  You have a fever.  You have chills.  You have increased bleeding from the catheter insertion site. Hold pressure on the site. CALL 911 SEEK IMMEDIATE MEDICAL CARE IF:  You develop chest pain or shortness of breath, feel faint, or pass out.  You have unusual pain at the catheter insertion site.  You have redness, warmth, or swelling at the catheter insertion site.  You have drainage (other than a small amount of blood on the dressing) from the catheter insertion site.  The catheter insertion site is bleeding, and the bleeding does not stop after 30 minutes of holding steady pressure on the site.  You develop bleeding from any other place, such as from the rectum. There may be bright red blood in your urine or stool, or it may appear as black, tarry stool.   This information is not intended to replace advice given to you by your health care provider. Make sure you discuss any questions you have with your health care provider.   Document Released: 09/23/2004 Document Revised: 03/28/2014  Document Reviewed: 11/12/2012 Elsevier Interactive Patient Education [employer].

## 2015-04-07 NOTE — H&P (Signed)
Brief History and Physical  History of Present Illness  [name] is a 70 y.o. male who presents with chief complaint: large infilitration of LUA AVF.  The patient has a "bad stick" a few weeks ago with a massive infilitration extending reported from axilla to antecubitum.  He had a LIJV TDC placement.  The patient denies any external bleeding episodes.  He also notes difficulty with cannulation.  The patient presents today for L arm fistulogram, possible intervention.    Past Medical History  Diagnosis Date  . Hypertension   . COPD (chronic obstructive pulmonary disease) ([place])   . Hyperlipidemia   . Cardiomyopathy     EF of 30% per echo in June of 2012; admitted with congestive heart failure; nonobstructive CAD on cath in 08/2010  . History of noncompliance with medical treatment   . Cerebrovascular disease 2011    CVA  in 02/2010-only deficit is decreased vision in  right eye  . Alcohol abuse   . Tobacco abuse     40 pack years  . Abdominal aortic aneurysm ([place])     Stent graft repair  . Chronic systolic CHF (congestive heart failure) ([place])   . Leg pain   . CKD (chronic kidney disease) stage 3, GFR 30-59 ml/min     creatinine-1.7 in 08/2010  . CAD (coronary artery disease)   . Myocardial infarction acute ([place]) 10/01/11  . CKD (chronic kidney disease), stage III 12/06/2012  . Effusion of right knee joint 12/06/2012  . Anemia   . Atrial fibrillation ([place])   . Ataxic gait   . Weakness generalized   . Frequent falls   . Forgetfulness   . Stroke [facility]) 2011    decreased vision of right eye    Most recent 05/23/14 - bleed - has a drag to his left leg  . Depression   . Anxiety     Takes Citalopram  . Arthritis     knees  . Renal insufficiency     Past Surgical History  Procedure Laterality Date  . Colonoscopy w/ polypectomy  2006    Dr. Tamala Julian: anal fissure, sessile adenomatous polyp, diverticulosis  . Esophagogastroduodenoscopy  11/15/2010    Procedure:  ESOPHAGOGASTRODUODENOSCOPY (EGD);  Surgeon: Dorothyann Peng, MD;  Location: [facility];  Service: Endoscopy;  Laterality: N/A;  with propofol sedation; procedure start @ 0813  . Flexible sigmoidoscopy  11/15/2010    Procedure: FLEXIBLE SIGMOIDOSCOPY;  Surgeon: Dorothyann Peng, MD;  Location: [facility];  Service: Endoscopy;  Laterality: N/A;  with propofol sedation; ended @ 0808  . Polypectomy  12/13/2010    Procedure: POLYPECTOMY;  Surgeon: Dorothyann Peng, MD;  Location: [facility];  Service: Endoscopy;  Laterality: N/A;  right colon, cecal, transverse, and sigmoid polypectomy  . Cardiac catheterization  10/04/11    Normal left main, 95% pLAD stenosis with ulceration s/p successful BMS placement, 30% segmental plaque beyond this, dLAD after D2 with 50% plaquing, then focal 70% lesion, 80% focal short D2 stenosis, 30% pOM2 lesion, 30-40% mOM3 stenosis, Shephard's crook region of RCA with 50% lesion, mRCA 50-60% lesion and mild dRCA irregularities.   . Coronary stent placement  10/04/11  . Abdominal aortic aneurysm repair w/ endoluminal graft      01/16/2008  . Left heart catheterization with coronary angiogram N/A 10/04/2011    Procedure: LEFT HEART CATHETERIZATION WITH CORONARY ANGIOGRAM;  Surgeon: Hillary Bow, MD;  Location: [facility] CATH LAB;  Service: Cardiovascular;  Laterality: N/A;  .  Av fistula placement Left 07/07/2014    Procedure: Creation of Left Arm ARTERIOVENOUS Fistula;  Surgeon: Rosetta Posner, MD;  Location: [place];  Service: Vascular;  Laterality: Left;  . Insertion of dialysis catheter Right 08/05/2014    Procedure: INSERTION OF DIALYSIS CATHETER Right internal Jugular;  Surgeon: Conrad Gardners, MD;  Location: [place];  Service: Vascular;  Laterality: Right;  . Port-a-cath removal    . Peripheral vascular catheterization Left 01/15/2015    Procedure: Fistulagram;  Surgeon: Conrad Megargel, MD;  Location: [place] CV LAB;  Service: Cardiovascular;  Laterality: Left;  arm  . Peripheral vascular  catheterization Left 01/15/2015    Procedure: Peripheral Vascular Intervention;  Surgeon: Conrad Cudahy, MD;  Location: [place] CV LAB;  Service: Cardiovascular;  Laterality: Left;  ARM VEINOUS PTA  . Insertion of dialysis catheter Left 02/13/2015    Procedure: INSERTION OF DIALYSIS CATHETER AND CENTRAL VENOGRAM;  Surgeon: Serafina Mitchell, MD;  Location: [place];  Service: Vascular;  Laterality: Left;    Social History   Social History  . Marital Status: Married    Spouse Name: N/A  . Number of Children: N/A  . Years of Education: N/A   Occupational History  . retired     [employer]   Social History Main Topics  . Smoking status: Former Smoker -- 0.50 packs/day for 50 years    Types: Cigarettes    Start date: 06/25/1957    Quit date: 04/04/2014  . Smokeless tobacco: Never Used  . Alcohol Use: No  . Drug Use: No  . Sexual Activity: No   Other Topics Concern  . Not on file   Social History Narrative   Married, retired from [employer], 6 children   Right handed   Caffeine use - none    Family History  Problem Relation Age of Onset  . Colon cancer Neg Hx   . Anesthesia problems Neg Hx   . Hypotension Neg Hx   . Malignant hyperthermia Neg Hx   . Pseudochol deficiency Neg Hx   . Cancer Mother     Gall Bladder  . Heart disease Mother     before age 19  . Hyperlipidemia Mother   . Hypertension Mother   . Asthma Mother   . Cancer Sister     Cervical    No current facility-administered medications on file prior to encounter.   Current Outpatient Prescriptions on File Prior to Encounter  Medication Sig Dispense Refill  . aspirin EC 81 MG tablet Take 81 mg by mouth daily.    Marland Kitchen atorvastatin (LIPITOR) 80 MG tablet TAKE 1 TABLET (80 MG TOTAL) BY MOUTH DAILY AT 6 PM. 90 tablet 3  . b complex-vitamin c-folic acid (NEPHRO-VITE) 0.8 MG TABS tablet Take 1 tablet by mouth daily.  11  . carvedilol (COREG) 3.125 MG tablet Take 1 tablet (3.125 mg total) by mouth 2 (two)  times daily. (Patient taking differently: Take 3.125 mg by mouth daily as needed (HR >60BPM). **May take an additional tab as needed based on BP levels) 60 tablet 6  . citalopram (CELEXA) 10 MG tablet TAKE 1 TABLET BY MOUTH EVERY DAY 30 tablet 2  . lidocaine-prilocaine (EMLA) cream Apply 1 application topically as needed.  6  . pantoprazole (PROTONIX) 40 MG tablet TAKE 1 TABLET (40 MG TOTAL) BY MOUTH DAILY. 30 tablet 11  . polyethylene glycol (MIRALAX / GLYCOLAX) packet Take 17 g by mouth daily. (Patient taking differently: Take 17  g by mouth daily as needed for mild constipation. ) 14 each 0  . VOLTAREN 1 % GEL Apply 4 g topically daily as needed (for knee pain). Bilateral Knee  1  . ZETIA 10 MG tablet TAKE 1 TABLET (10 MG TOTAL) BY MOUTH DAILY. 30 tablet 6  . [DISCONTINUED] metoprolol (TOPROL-XL) 50 MG 24 hr tablet Take 50 mg by mouth daily.      . [DISCONTINUED] niacin (NIASPAN) 500 MG CR tablet Take 500 mg by mouth at bedtime.      . [DISCONTINUED] omeprazole (PRILOSEC) 20 MG capsule 1 po every morning 30 capsule 5    No Known Allergies  Review of Systems: As listed above, otherwise negative.  Physical Examination  Filed Vitals:   04/07/15 0721  BP: 169/88  Pulse: 49  Temp: 98 F (36.7 C)  TempSrc: Oral  Resp: 16  Height: 6\' 3"  (1.905 m)  Weight: 162 lb (73.483 kg)  SpO2: 100%    General: A&O x 3, WDWN  Pulmonary: Sym exp, good air movt, CTAB, no rales, rhonchi, & wheezing  Cardiac: RRR, Nl S1, S2, no Murmurs, rubs or gallops  Gastrointestinal: soft, NTND, -G/R, - HSM, - masses, - CVAT B  Musculoskeletal: M/S 5/5 throughout , Extremities without ischemic changes , mod-large PSA in mid-segment with concerning bleb off the PSA  Laboratory See [place] is a 70 y.o. male who presents with: malfunctioning LUA AVF, large L UA AVF PSA with rupture risk   The patient is scheduled for: L arm fistulogram, possible  intervention.  Depending on the findings, will arrange: L UA AVF ligation vs PSA plication ASAP. I discussed with the patient the nature of angiographic procedures, especially the limited patencies of any endovascular intervention.  The patient is aware of that the risks of an angiographic procedure include but are not limited to: bleeding, infection, access site complications, renal failure, embolization, rupture of vessel, dissection, possible need for emergent surgical intervention, possible need for surgical procedures to treat the patient's pathology, and stroke and death.    The patient is aware of the risks and agrees to proceed.  Adele Barthel, MD [facility] [place] Office: [phone] Pager: [phone]  04/07/2015, 7:38 AM

## 2015-04-08 ENCOUNTER — Encounter (HOSPITAL_COMMUNITY): Payer: Self-pay | Admitting: *Deleted

## 2015-04-08 NOTE — Progress Notes (Signed)
Spoke with pt's wife, Chyrese for pre-op call. Pt has early onset dementia. She states pt has not c/o any recent chest pain. States he will c/o feeling "winded" after dialysis some days. She also denies any recent TIA's or stroke like symptoms.   Stress test - 2010 - in EPIC Cath - 10/04/11 - in EPIC Echo - 05/24/14 - in EPIC EKG - 02/13/15 - in EPIC

## 2015-04-08 NOTE — Anesthesia Preprocedure Evaluation (Addendum)
Anesthesia Evaluation  Patient identified by MRN, date of birth, ID band Patient awake    Reviewed: Allergy & Precautions, NPO status , Patient's Chart, lab work & pertinent test results, reviewed documented beta blocker date and time   History of Anesthesia Complications Negative for: history of anesthetic complications  Airway Mallampati: I  TM Distance: >3 FB Neck ROM: Full    Dental  (+) Edentulous Upper, Edentulous Lower   Pulmonary COPD, former smoker,    Pulmonary exam normal        Cardiovascular hypertension, Pt. on home beta blockers and Pt. on medications + CAD, + Past MI, + Cardiac Stents (2013), + Peripheral Vascular Disease (AAA s/p stent 2009) and +CHF  Normal cardiovascular exam Rhythm:Regular Rate:Normal  Echo 05/24/14: Study Conclusions  - Left ventricle: The cavity size was normal. There was moderateconcentric hypertrophy. Systolic function was normal. Theestimated ejection fraction was in the range of 50% to 55%. Wallmotion was normal; there were no regional wall motionabnormalities. Doppler parameters are consistent with abnormalleft ventricular relaxation (grade 1 diastolic dysfunction).Doppler parameters are consistent with high ventricular fillingpressure. - Ventricular septum: Septal motion showed abnormal function anddyssynergy. These changes are consistent with intraventricularconduction delay. - Aortic valve: Mildly thickened, mildly calcified leaflets. Therewas moderate regurgitation. - Mitral valve: There was trivial regurgitation. - Left atrium: The atrium was moderately dilated. Volume/bsa, ES(1-plane Simpson&'s, A4C): 37.1 ml/m^2. - Right atrium: The atrium was mildly dilated. - Tricuspid valve: There was trivial regurgitation.     Neuro/Psych PSYCHIATRIC DISORDERS Anxiety Depression CVA, Residual Symptoms    GI/Hepatic Neg liver ROS, GERD  Medicated,  Endo/Other  negative endocrine  ROS  Renal/GU ESRF and DialysisRenal disease     Musculoskeletal  (+) Arthritis , Osteoarthritis,    Abdominal   Peds  Hematology  (+) Blood dyscrasia, anemia ,   Anesthesia Other Findings   Reproductive/Obstetrics                         Anesthesia Physical Anesthesia Plan  ASA: III  Anesthesia Plan: General   Post-op Pain Management:    Induction: Intravenous  Airway Management Planned: LMA  Additional Equipment:   Intra-op Plan:   Post-operative Plan: Extubation in OR  Informed Consent: I have reviewed the patients History and Physical, chart, labs and discussed the procedure including the risks, benefits and alternatives for the proposed anesthesia with the patient or authorized representative who has indicated his/her understanding and acceptance.   Dental advisory given  Plan Discussed with: CRNA, Anesthesiologist and Surgeon  Anesthesia Plan Comments:        Anesthesia Quick Evaluation

## 2015-04-09 ENCOUNTER — Ambulatory Visit (HOSPITAL_COMMUNITY)
Admission: RE | Admit: 2015-04-09 | Discharge: 2015-04-09 | Disposition: A | Discharge status: Home / Self Care | Payer: Medicare Other | Source: Ambulatory Visit | Attending: Vascular Surgery | Admitting: Vascular Surgery

## 2015-04-09 ENCOUNTER — Ambulatory Visit (HOSPITAL_COMMUNITY): Payer: Medicare Other | Admitting: Anesthesiology

## 2015-04-09 ENCOUNTER — Encounter (HOSPITAL_COMMUNITY)
Admission: RE | Disposition: A | Discharge status: Home / Self Care | Payer: Self-pay | Source: Ambulatory Visit | Attending: Vascular Surgery

## 2015-04-09 DIAGNOSIS — N186 End stage renal disease: Secondary | ICD-10-CM | POA: Insufficient documentation

## 2015-04-09 DIAGNOSIS — Z7982 Long term (current) use of aspirin: Secondary | ICD-10-CM | POA: Insufficient documentation

## 2015-04-09 DIAGNOSIS — I132 Hypertensive heart and chronic kidney disease with heart failure and with stage 5 chronic kidney disease, or end stage renal disease: Secondary | ICD-10-CM | POA: Insufficient documentation

## 2015-04-09 DIAGNOSIS — Z79899 Other long term (current) drug therapy: Secondary | ICD-10-CM | POA: Diagnosis not present

## 2015-04-09 DIAGNOSIS — T82898A Other specified complication of vascular prosthetic devices, implants and grafts, initial encounter: Secondary | ICD-10-CM | POA: Insufficient documentation

## 2015-04-09 DIAGNOSIS — I4891 Unspecified atrial fibrillation: Secondary | ICD-10-CM | POA: Diagnosis not present

## 2015-04-09 DIAGNOSIS — I5022 Chronic systolic (congestive) heart failure: Secondary | ICD-10-CM | POA: Diagnosis not present

## 2015-04-09 DIAGNOSIS — I69398 Other sequelae of cerebral infarction: Secondary | ICD-10-CM | POA: Diagnosis not present

## 2015-04-09 DIAGNOSIS — I252 Old myocardial infarction: Secondary | ICD-10-CM | POA: Diagnosis not present

## 2015-04-09 DIAGNOSIS — I2510 Atherosclerotic heart disease of native coronary artery without angina pectoris: Secondary | ICD-10-CM | POA: Insufficient documentation

## 2015-04-09 DIAGNOSIS — Y832 Surgical operation with anastomosis, bypass or graft as the cause of abnormal reaction of the patient, or of later complication, without mention of misadventure at the time of the procedure: Secondary | ICD-10-CM | POA: Insufficient documentation

## 2015-04-09 DIAGNOSIS — E785 Hyperlipidemia, unspecified: Secondary | ICD-10-CM | POA: Insufficient documentation

## 2015-04-09 DIAGNOSIS — M170 Bilateral primary osteoarthritis of knee: Secondary | ICD-10-CM | POA: Insufficient documentation

## 2015-04-09 DIAGNOSIS — F419 Anxiety disorder, unspecified: Secondary | ICD-10-CM | POA: Diagnosis not present

## 2015-04-09 DIAGNOSIS — Z87891 Personal history of nicotine dependence: Secondary | ICD-10-CM | POA: Insufficient documentation

## 2015-04-09 DIAGNOSIS — Z992 Dependence on renal dialysis: Secondary | ICD-10-CM | POA: Diagnosis not present

## 2015-04-09 DIAGNOSIS — I739 Peripheral vascular disease, unspecified: Secondary | ICD-10-CM | POA: Diagnosis not present

## 2015-04-09 DIAGNOSIS — F329 Major depressive disorder, single episode, unspecified: Secondary | ICD-10-CM | POA: Insufficient documentation

## 2015-04-09 DIAGNOSIS — I429 Cardiomyopathy, unspecified: Secondary | ICD-10-CM | POA: Diagnosis not present

## 2015-04-09 DIAGNOSIS — H5461 Unqualified visual loss, right eye, normal vision left eye: Secondary | ICD-10-CM | POA: Insufficient documentation

## 2015-04-09 DIAGNOSIS — J449 Chronic obstructive pulmonary disease, unspecified: Secondary | ICD-10-CM | POA: Insufficient documentation

## 2015-04-09 HISTORY — PX: PATCH ANGIOPLASTY: SHX6230

## 2015-04-09 HISTORY — DX: Benign prostatic hyperplasia without lower urinary tract symptoms: N40.0

## 2015-04-09 HISTORY — DX: Gastro-esophageal reflux disease without esophagitis: K21.9

## 2015-04-09 HISTORY — DX: Unspecified dementia, unspecified severity, without behavioral disturbance, psychotic disturbance, mood disturbance, and anxiety: F03.90

## 2015-04-09 HISTORY — PX: REVISON OF ARTERIOVENOUS FISTULA: SHX6074

## 2015-04-09 LAB — POCT I-STAT 4, (NA,K, GLUC, HGB,HCT)
GLUCOSE: 77 mg/dL (ref 65–99)
HEMATOCRIT: 42 % (ref 39.0–52.0)
Hemoglobin: 14.3 g/dL (ref 13.0–17.0)
POTASSIUM: 4.6 mmol/L (ref 3.5–5.1)
SODIUM: 136 mmol/L (ref 135–145)

## 2015-04-09 SURGERY — REVISON OF ARTERIOVENOUS FISTULA
Anesthesia: General | Site: Arm Upper | Laterality: Left

## 2015-04-09 MED ORDER — PROPOFOL 10 MG/ML IV BOLUS
INTRAVENOUS | Status: AC
  Filled 2015-04-09: qty 20

## 2015-04-09 MED ORDER — FENTANYL CITRATE (PF) 100 MCG/2ML IJ SOLN
INTRAMUSCULAR | Status: DC | PRN
  Administered 2015-04-09: 50 ug via INTRAVENOUS
  Administered 2015-04-09: 25 ug via INTRAVENOUS
  Administered 2015-04-09: 50 ug via INTRAVENOUS

## 2015-04-09 MED ORDER — ROCURONIUM BROMIDE 50 MG/5ML IV SOLN
INTRAVENOUS | Status: AC
  Filled 2015-04-09: qty 1

## 2015-04-09 MED ORDER — CHLORHEXIDINE GLUCONATE CLOTH 2 % EX PADS: 6.0000 | MEDICATED_PAD | Freq: Once | CUTANEOUS | Status: DC

## 2015-04-09 MED ORDER — ONDANSETRON HCL 4 MG/2ML IJ SOLN
INTRAMUSCULAR | Status: AC
  Filled 2015-04-09: qty 2

## 2015-04-09 MED ORDER — LIDOCAINE HCL (CARDIAC) 20 MG/ML IV SOLN
INTRAVENOUS | Status: AC
  Filled 2015-04-09: qty 5

## 2015-04-09 MED ORDER — OXYCODONE-ACETAMINOPHEN 5-325 MG PO TABS: 1.0000 | ORAL_TABLET | Freq: Four times a day (QID) | ORAL | Status: DC | PRN

## 2015-04-09 MED ORDER — EPHEDRINE SULFATE 50 MG/ML IJ SOLN
INTRAMUSCULAR | Status: DC | PRN
  Administered 2015-04-09: 5 mg via INTRAVENOUS

## 2015-04-09 MED ORDER — EPHEDRINE SULFATE 50 MG/ML IJ SOLN
INTRAMUSCULAR | Status: AC
  Filled 2015-04-09: qty 1

## 2015-04-09 MED ORDER — MIDAZOLAM HCL 2 MG/2ML IJ SOLN
INTRAMUSCULAR | Status: AC
  Filled 2015-04-09: qty 2

## 2015-04-09 MED ORDER — PROTAMINE SULFATE 10 MG/ML IV SOLN
INTRAVENOUS | Status: DC | PRN
  Administered 2015-04-09: 25 mg via INTRAVENOUS
  Administered 2015-04-09: 20 mg via INTRAVENOUS
  Administered 2015-04-09: 5 mg via INTRAVENOUS

## 2015-04-09 MED ORDER — FENTANYL CITRATE (PF) 250 MCG/5ML IJ SOLN
INTRAMUSCULAR | Status: AC
  Filled 2015-04-09: qty 5

## 2015-04-09 MED ORDER — LIDOCAINE HCL (CARDIAC) 20 MG/ML IV SOLN
INTRAVENOUS | Status: DC | PRN
  Administered 2015-04-09: 100 mg via INTRAVENOUS

## 2015-04-09 MED ORDER — SODIUM CHLORIDE 0.9 % IV SOLN: INTRAVENOUS | Status: DC

## 2015-04-09 MED ORDER — LIDOCAINE HCL (PF) 1 % IJ SOLN
INTRAMUSCULAR | Status: AC
  Filled 2015-04-09: qty 30

## 2015-04-09 MED ORDER — SUCCINYLCHOLINE CHLORIDE 20 MG/ML IJ SOLN
INTRAMUSCULAR | Status: AC
  Filled 2015-04-09: qty 1

## 2015-04-09 MED ORDER — ONDANSETRON HCL 4 MG/2ML IJ SOLN
INTRAMUSCULAR | Status: DC | PRN
  Administered 2015-04-09: 4 mg via INTRAVENOUS

## 2015-04-09 MED ORDER — PROPOFOL 500 MG/50ML IV EMUL
INTRAVENOUS | Status: DC | PRN
  Administered 2015-04-09: 50 ug/kg/min via INTRAVENOUS

## 2015-04-09 MED ORDER — PROPOFOL 10 MG/ML IV BOLUS
INTRAVENOUS | Status: DC | PRN
  Administered 2015-04-09: 150 mg via INTRAVENOUS

## 2015-04-09 MED ORDER — SODIUM CHLORIDE 0.9 % IV SOLN
INTRAVENOUS | Status: DC | PRN
  Administered 2015-04-09: 12:00:00

## 2015-04-09 MED ORDER — SODIUM CHLORIDE 0.9 % IV SOLN
INTRAVENOUS | Status: DC
  Administered 2015-04-09: 11:00:00 via INTRAVENOUS

## 2015-04-09 MED ORDER — HEMOSTATIC AGENTS (NO CHARGE) OPTIME
TOPICAL | Status: DC | PRN
  Administered 2015-04-09: 1 via TOPICAL

## 2015-04-09 MED ORDER — SODIUM CHLORIDE 0.9 % IJ SOLN
INTRAMUSCULAR | Status: AC
  Filled 2015-04-09: qty 10

## 2015-04-09 MED ORDER — DEXTROSE 5 % IV SOLN
1.5000 g | INTRAVENOUS | Status: AC
  Administered 2015-04-09: 1.5 g via INTRAVENOUS

## 2015-04-09 MED ORDER — HEPARIN SODIUM (PORCINE) 1000 UNIT/ML IJ SOLN
INTRAMUSCULAR | Status: DC | PRN
  Administered 2015-04-09: 7000 [IU] via INTRAVENOUS

## 2015-04-09 MED ORDER — 0.9 % SODIUM CHLORIDE (POUR BTL) OPTIME
TOPICAL | Status: DC | PRN
  Administered 2015-04-09 (×2): 1000 mL

## 2015-04-09 MED ORDER — DEXTROSE 5 % IV SOLN
INTRAVENOUS | Status: AC
  Filled 2015-04-09: qty 1.5

## 2015-04-09 SURGICAL SUPPLY — 39 items
CANISTER SUCTION 2500CC (MISCELLANEOUS) ×3 IMPLANT
CLIP TI MEDIUM 6 (CLIP) ×3 IMPLANT
CLIP TI WIDE RED SMALL 6 (CLIP) ×3 IMPLANT
COVER PROBE W GEL 5X96 (DRAPES) IMPLANT
DECANTER SPIKE VIAL GLASS SM (MISCELLANEOUS) ×3 IMPLANT
DRAIN PENROSE 1/2X12 LTX STRL (WOUND CARE) IMPLANT
ELECT REM PT RETURN 9FT ADLT (ELECTROSURGICAL) ×3
ELECTRODE REM PT RTRN 9FT ADLT (ELECTROSURGICAL) ×1 IMPLANT
GLOVE BIO SURGEON STRL SZ 6.5 (GLOVE) ×1 IMPLANT
GLOVE BIO SURGEON STRL SZ7 (GLOVE) ×3 IMPLANT
GLOVE BIO SURGEONS STRL SZ 6.5 (GLOVE) ×1
GLOVE BIOGEL PI IND STRL 6.5 (GLOVE) IMPLANT
GLOVE BIOGEL PI IND STRL 7.0 (GLOVE) IMPLANT
GLOVE BIOGEL PI IND STRL 7.5 (GLOVE) ×1 IMPLANT
GLOVE BIOGEL PI INDICATOR 6.5 (GLOVE) ×6
GLOVE BIOGEL PI INDICATOR 7.0 (GLOVE) ×2
GLOVE BIOGEL PI INDICATOR 7.5 (GLOVE) ×4
GLOVE ECLIPSE 7.0 STRL STRAW (GLOVE) ×2 IMPLANT
GLOVE SURG SS PI 7.0 STRL IVOR (GLOVE) ×2 IMPLANT
GOWN STRL REUS W/ TWL LRG LVL3 (GOWN DISPOSABLE) ×3 IMPLANT
GOWN STRL REUS W/TWL LRG LVL3 (GOWN DISPOSABLE) ×9
KIT BASIN OR (CUSTOM PROCEDURE TRAY) ×3 IMPLANT
KIT ROOM TURNOVER OR (KITS) ×3 IMPLANT
LIQUID BAND (GAUZE/BANDAGES/DRESSINGS) ×3 IMPLANT
NS IRRIG 1000ML POUR BTL (IV SOLUTION) ×5 IMPLANT
PACK CV ACCESS (CUSTOM PROCEDURE TRAY) ×3 IMPLANT
PAD ARMBOARD 7.5X6 YLW CONV (MISCELLANEOUS) ×6 IMPLANT
PATCH VASC XENOSURE 1CMX6CM (Vascular Products) ×3 IMPLANT
PATCH VASC XENOSURE 1X6 (Vascular Products) IMPLANT
SPONGE GAUZE 4X4 12PLY STER LF (GAUZE/BANDAGES/DRESSINGS) ×2 IMPLANT
SPONGE SURGIFOAM ABS GEL 100 (HEMOSTASIS) IMPLANT
STAPLER VISISTAT 35W (STAPLE) ×2 IMPLANT
SUT MNCRL AB 4-0 PS2 18 (SUTURE) ×3 IMPLANT
SUT PROLENE 6 0 BV (SUTURE) ×2 IMPLANT
SUT PROLENE 7 0 BV 1 (SUTURE) ×5 IMPLANT
SUT VIC AB 3-0 SH 27 (SUTURE) ×3
SUT VIC AB 3-0 SH 27X BRD (SUTURE) ×1 IMPLANT
UNDERPAD 30X30 INCONTINENT (UNDERPADS AND DIAPERS) ×3 IMPLANT
WATER STERILE IRR 1000ML POUR (IV SOLUTION) ×3 IMPLANT

## 2015-04-09 NOTE — Progress Notes (Signed)
Dialysis access record faxed to  dialysis center 

## 2015-04-09 NOTE — Transfer of Care (Signed)
Immediate Anesthesia Transfer of Care Note  Patient: [name]  Procedure(s) Performed: Procedure(s): PLICATION OF LEFT ARM PSEUDOANEURYSM (Left) PATCH ANGIOPLASTY (Left)  Patient Location: PACU  Anesthesia Type:General  Level of Consciousness: sedated  Airway & Oxygen Therapy: Patient Spontanous Breathing and Patient connected to nasal cannula oxygen  Post-op Assessment: Report given to RN and Post -op Vital signs reviewed and stable  Post vital signs: Reviewed and stable  Last Vitals:  Filed Vitals:   04/09/15 0928  Pulse: 50  Temp: 36.6 C  Resp: 16    Complications: No apparent anesthesia complications

## 2015-04-09 NOTE — Interval H&P Note (Signed)
[facility]  History and Physical Update  The patient was interviewed and re-examined.  The patient's previous History and Physical has been reviewed and is unchanged from my consult except for: interval fistulogram and venoplasty.  There is no change in the plan of care: plication of L BC AVF.  Risk, benefits, and alternatives to access surgery were discussed.  The patient is aware the risks include but are not limited to: bleeding, infection, steal syndrome, nerve damage, ischemic monomelic neuropathy, failure to mature, need for additional procedures, death and stroke.  The patient agrees to proceed forward with the procedure.   Adele Barthel, MD [facility] [place] Office: [phone] Pager: [phone]  04/09/2015, 9:23 AM

## 2015-04-09 NOTE — Op Note (Signed)
OPERATIVE NOTE   PROCEDURE: 1. Left pseudoaneurysm plication 2. Revision of left brachiocephalic arteriovenous fistula with bovine patch angioplasty  PRE-OPERATIVE DIAGNOSIS: imminent hemorrhage from left brachiocephalic arteriovenous fistula pseudoaneurysm  POST-OPERATIVE DIAGNOSIS: same as above   SURGEON: Adele Barthel, MD  ASSISTANT(S): Gerri Lins, [facility]   ANESTHESIA: general  ESTIMATED BLOOD LOSS: 50 cc  FINDING(S): 1.  Moderate size pseudoaneurysm lateral to fistula with 2 mm hole in fistula 2.  Palpable thrill and radial pulse at end of case  SPECIMEN(S):  none  INDICATIONS:   [name] is a 70 y.o. male who presents with difficulties with cannulation and worrisome pseudoaneurysm.  The patient earlier this week underwent a fistulogram to evaluate the fistula.  I venoplastied some stenoses that were contributing to cannulation issues.  I also recommended to the patient proceeding with a plication of the pseudoaneurysm as a portion had significantly attenuated skin that would likely result in a massive hemorrhage from the left brachiocephalic arteriovenous fistula.  Risk, benefits, and alternatives to access surgery were discussed.  The patient is aware the risks include but are not limited to: bleeding, infection, steal syndrome, nerve damage, ischemic monomelic neuropathy, failure to mature, need for additional procedures, death and stroke.  The patient agrees to proceed forward with the procedure.   DESCRIPTION: After obtaining full informed written consent, the patient was brought back to the operating room and placed supine upon the operating table.  The patient received IV antibiotics prior to induction.  After obtaining adequate anesthesia, the patient was prepped and draped in the standard fashion for: left arm access.  The patient was given 7000 units of Heparin intravenously, which was a therapeutic bolus.  I placed a sterile tourniquet on the upper arm.   After waiting 3 minutes, I exsanguinated the left arm and inflated the tourniquet at 250 mm Hg.  I made an elliptical incision over the pseudoaneurysm.  I dissected down through the subcutaneous tissue and entered into a pseudoaneurysm.  There was acute thrombus and chronic thrombus in this pseudoaneurysm cavity.  I could not easily identify the neck of this pseudoaneurysm, so I packed the pseudoaneurysm and deflated the tourniquet.  I slowly removed the packing and identified a hole in the fistula medially.  I repacked the pseudoaneurysm cavity to get pressure on the bleeding point.  I re-exsanguinated the left arm and inflated the tourniquet to 250 mm Hg.  I then washed out the pseudoaneurysm cavity.  I could see a 1.5 mm hole in the fistula with an attenuate section of the fistula adjacent.  I sharply opened up into this attenuated segment with a Potts scissor.  This was now a 2 mm hole.  I obtained a bovine pericardial patch and soaked it the manufacturer recommended time.  I sharply fashioned this patch for the hole in the fistula.  I sewed a bovine patch onto the fistula with a running stitch of 7-0 Prolene.  I deflated the tourniquet.  Another hole was found and repaired with a figure of 8 stitch of 7-0 prolene.  I packed the pseudoaneurysm cavity with Avitene and gave 40 mg of Protamine to reverse anticoagulation.  After waiting a few minutes, the bleeding slowed down.  I controlled a few oozes with electrocautery.  I then repacked the wound with Avitene.  After waiting a few more minutes, the active bleeding stopped.  I then took severe big bites of the deep subcutaneous tissue with a 3-0 vicryl to try to obliterate  the pseudoaneurysmal cavity.  I then reapproximated the superficial subcutaneous tissue with a running stitch of 3-0 Vicryl.  The skin was reapproximated with staples.  At this end of this case, there was an improved thrill, palpable radial pulse, and obliteration of the prior pseudoaneurysm  pulse.   COMPLICATIONS: none  CONDITION: stable   Adele Barthel, MD [facility] [place] Office: [phone] Pager: [phone]  04/09/2015, 12:24 PM

## 2015-04-09 NOTE — Anesthesia Postprocedure Evaluation (Signed)
Anesthesia Post Note  Patient: [name]  Procedure(s) Performed: Procedure(s) (LRB): PLICATION OF LEFT ARM PSEUDOANEURYSM (Left) PATCH ANGIOPLASTY (Left)  Patient location during evaluation: PACU Anesthesia Type: General Level of consciousness: awake and alert, awake and oriented Pain management: pain level controlled Vital Signs Assessment: post-procedure vital signs reviewed and stable Respiratory status: spontaneous breathing, nonlabored ventilation, respiratory function stable and patient connected to nasal cannula oxygen Cardiovascular status: blood pressure returned to baseline and stable Postop Assessment: no signs of nausea or vomiting Anesthetic complications: no    Last Vitals:  Filed Vitals:   04/09/15 1405 04/09/15 1415  BP: 144/84 161/87  Pulse:  55  Temp:    Resp:  11    Last Pain:  Filed Vitals:   04/09/15 1427  PainSc: 0-No pain                 Catalina Gravel

## 2015-04-09 NOTE — Progress Notes (Signed)
Good bruit heard left arm

## 2015-04-09 NOTE — OR Nursing (Signed)
Maureen PA at bs to assess scant persist drg from op site. Gentle pressure drg applied over further stabilizing drg. Patient okay for d/c home.

## 2015-04-09 NOTE — H&P (View-Only) (Signed)
Brief History and Physical  History of Present Illness  [name] is a 70 y.o. male who presents with chief complaint: large infilitration of LUA AVF.  The patient has a "bad stick" a few weeks ago with a massive infilitration extending reported from axilla to antecubitum.  He had a LIJV TDC placement.  The patient denies any external bleeding episodes.  He also notes difficulty with cannulation.  The patient presents today for L arm fistulogram, possible intervention.    Past Medical History  Diagnosis Date  . Hypertension   . COPD (chronic obstructive pulmonary disease) ([place])   . Hyperlipidemia   . Cardiomyopathy     EF of 30% per echo in June of 2012; admitted with congestive heart failure; nonobstructive CAD on cath in 08/2010  . History of noncompliance with medical treatment   . Cerebrovascular disease 2011    CVA  in 02/2010-only deficit is decreased vision in  right eye  . Alcohol abuse   . Tobacco abuse     40 pack years  . Abdominal aortic aneurysm ([place])     Stent graft repair  . Chronic systolic CHF (congestive heart failure) ([place])   . Leg pain   . CKD (chronic kidney disease) stage 3, GFR 30-59 ml/min     creatinine-1.7 in 08/2010  . CAD (coronary artery disease)   . Myocardial infarction acute ([place]) 10/01/11  . CKD (chronic kidney disease), stage III 12/06/2012  . Effusion of right knee joint 12/06/2012  . Anemia   . Atrial fibrillation ([place])   . Ataxic gait   . Weakness generalized   . Frequent falls   . Forgetfulness   . Stroke [facility]) 2011    decreased vision of right eye    Most recent 05/23/14 - bleed - has a drag to his left leg  . Depression   . Anxiety     Takes Citalopram  . Arthritis     knees  . Renal insufficiency     Past Surgical History  Procedure Laterality Date  . Colonoscopy w/ polypectomy  2006    Dr. Tamala Julian: anal fissure, sessile adenomatous polyp, diverticulosis  . Esophagogastroduodenoscopy  11/15/2010    Procedure:  ESOPHAGOGASTRODUODENOSCOPY (EGD);  Surgeon: Dorothyann Peng, MD;  [facility];  Service: Endoscopy;  Laterality: N/A;  with propofol sedation; procedure start @ 0813  . Flexible sigmoidoscopy  11/15/2010    Procedure: FLEXIBLE SIGMOIDOSCOPY;  Surgeon: Dorothyann Peng, MD;  Location: [facility];  Service: Endoscopy;  Laterality: N/A;  with propofol sedation; ended @ 0808  . Polypectomy  12/13/2010    Procedure: POLYPECTOMY;  Surgeon: Dorothyann Peng, MD;  [facility];  Service: Endoscopy;  Laterality: N/A;  right colon, cecal, transverse, and sigmoid polypectomy  . Cardiac catheterization  10/04/11    Normal left main, 95% pLAD stenosis with ulceration s/p successful BMS placement, 30% segmental plaque beyond this, dLAD after D2 with 50% plaquing, then focal 70% lesion, 80% focal short D2 stenosis, 30% pOM2 lesion, 30-40% mOM3 stenosis, Shephard's crook region of RCA with 50% lesion, mRCA 50-60% lesion and mild dRCA irregularities.   . Coronary stent placement  10/04/11  . Abdominal aortic aneurysm repair w/ endoluminal graft      01/16/2008  . Left heart catheterization with coronary angiogram N/A 10/04/2011    Procedure: LEFT HEART CATHETERIZATION WITH CORONARY ANGIOGRAM;  Surgeon: Hillary Bow, MD;  Location: [facility] CATH LAB;  Service: Cardiovascular;  Laterality: N/A;  .  Av fistula placement Left 07/07/2014    Procedure: Creation of Left Arm ARTERIOVENOUS Fistula;  Surgeon: Rosetta Posner, MD;  Location: [place];  Service: Vascular;  Laterality: Left;  . Insertion of dialysis catheter Right 08/05/2014    Procedure: INSERTION OF DIALYSIS CATHETER Right internal Jugular;  Surgeon: Conrad Troy, MD;  Location: [place];  Service: Vascular;  Laterality: Right;  . Port-a-cath removal    . Peripheral vascular catheterization Left 01/15/2015    Procedure: Fistulagram;  Surgeon: Conrad Pataskala, MD;  Location: [place] CV LAB;  Service: Cardiovascular;  Laterality: Left;  arm  . Peripheral vascular  catheterization Left 01/15/2015    Procedure: Peripheral Vascular Intervention;  Surgeon: Conrad Charenton, MD;  Location: [place] CV LAB;  Service: Cardiovascular;  Laterality: Left;  ARM VEINOUS PTA  . Insertion of dialysis catheter Left 02/13/2015    Procedure: INSERTION OF DIALYSIS CATHETER AND CENTRAL VENOGRAM;  Surgeon: Serafina Mitchell, MD;  Location: [place];  Service: Vascular;  Laterality: Left;    Social History   Social History  . Marital Status: Married    Spouse Name: N/A  . Number of Children: N/A  . Years of Education: N/A   Occupational History  . retired     [employer]   Social History Main Topics  . Smoking status: Former Smoker -- 0.50 packs/day for 50 years    Types: Cigarettes    Start date: 06/25/1957    Quit date: 04/04/2014  . Smokeless tobacco: Never Used  . Alcohol Use: No  . Drug Use: No  . Sexual Activity: No   Other Topics Concern  . Not on file   Social History Narrative   Married, retired from [employer], 6 children   Right handed   Caffeine use - none    Family History  Problem Relation Age of Onset  . Colon cancer Neg Hx   . Anesthesia problems Neg Hx   . Hypotension Neg Hx   . Malignant hyperthermia Neg Hx   . Pseudochol deficiency Neg Hx   . Cancer Mother     Gall Bladder  . Heart disease Mother     before age 70  . Hyperlipidemia Mother   . Hypertension Mother   . Asthma Mother   . Cancer Sister     Cervical    No current facility-administered medications on file prior to encounter.   Current Outpatient Prescriptions on File Prior to Encounter  Medication Sig Dispense Refill  . aspirin EC 81 MG tablet Take 81 mg by mouth daily.    Marland Kitchen atorvastatin (LIPITOR) 80 MG tablet TAKE 1 TABLET (80 MG TOTAL) BY MOUTH DAILY AT 6 PM. 90 tablet 3  . b complex-vitamin c-folic acid (NEPHRO-VITE) 0.8 MG TABS tablet Take 1 tablet by mouth daily.  11  . carvedilol (COREG) 3.125 MG tablet Take 1 tablet (3.125 mg total) by mouth 2 (two)  times daily. (Patient taking differently: Take 3.125 mg by mouth daily as needed (HR >60BPM). **May take an additional tab as needed based on BP levels) 60 tablet 6  . citalopram (CELEXA) 10 MG tablet TAKE 1 TABLET BY MOUTH EVERY DAY 30 tablet 2  . lidocaine-prilocaine (EMLA) cream Apply 1 application topically as needed.  6  . pantoprazole (PROTONIX) 40 MG tablet TAKE 1 TABLET (40 MG TOTAL) BY MOUTH DAILY. 30 tablet 11  . polyethylene glycol (MIRALAX / GLYCOLAX) packet Take 17 g by mouth daily. (Patient taking differently: Take 17  g by mouth daily as needed for mild constipation. ) 14 each 0  . VOLTAREN 1 % GEL Apply 4 g topically daily as needed (for knee pain). Bilateral Knee  1  . ZETIA 10 MG tablet TAKE 1 TABLET (10 MG TOTAL) BY MOUTH DAILY. 30 tablet 6  . [DISCONTINUED] metoprolol (TOPROL-XL) 50 MG 24 hr tablet Take 50 mg by mouth daily.      . [DISCONTINUED] niacin (NIASPAN) 500 MG CR tablet Take 500 mg by mouth at bedtime.      . [DISCONTINUED] omeprazole (PRILOSEC) 20 MG capsule 1 po every morning 30 capsule 5    No Known Allergies  Review of Systems: As listed above, otherwise negative.  Physical Examination  Filed Vitals:   04/07/15 0721  BP: 169/88  Pulse: 49  Temp: 98 F (36.7 C)  TempSrc: Oral  Resp: 16  Height: 6\' 3"  (1.905 m)  Weight: 162 lb (73.483 kg)  SpO2: 100%    General: A&O x 3, WDWN  Pulmonary: Sym exp, good air movt, CTAB, no rales, rhonchi, & wheezing  Cardiac: RRR, Nl S1, S2, no Murmurs, rubs or gallops  Gastrointestinal: soft, NTND, -G/R, - HSM, - masses, - CVAT B  Musculoskeletal: M/S 5/5 throughout , Extremities without ischemic changes , mod-large PSA in mid-segment with concerning bleb off the PSA  [facility] [place] is a 70 y.o. male who presents with: malfunctioning LUA AVF, large L UA AVF PSA with rupture risk   The patient is scheduled for: L arm fistulogram, possible  intervention.  Depending on the findings, will arrange: L UA AVF ligation vs PSA plication ASAP. I discussed with the patient the nature of angiographic procedures, especially the limited patencies of any endovascular intervention.  The patient is aware of that the risks of an angiographic procedure include but are not limited to: bleeding, infection, access site complications, renal failure, embolization, rupture of vessel, dissection, possible need for emergent surgical intervention, possible need for surgical procedures to treat the patient's pathology, and stroke and death.    The patient is aware of the risks and agrees to proceed.  Adele Barthel, MD [facility] Office: [phone] Pager: [phone]  04/07/2015, 7:38 AM

## 2015-04-09 NOTE — Progress Notes (Signed)
Report given to robin roberts rn as caregiver 

## 2015-04-09 NOTE — Anesthesia Procedure Notes (Signed)
Procedure Name: LMA Insertion Date/Time: 04/09/2015 11:08 AM Performed by: Trixie Deis A Pre-anesthesia Checklist: Patient identified, Timeout performed, Emergency Drugs available, Suction available and Patient being monitored Patient Re-evaluated:Patient Re-evaluated prior to inductionOxygen Delivery Method: Circle system utilized Preoxygenation: Pre-oxygenation with 100% oxygen Intubation Type: IV induction Ventilation: Mask ventilation without difficulty LMA: LMA inserted LMA Size: 5.0 Number of attempts: 1 Placement Confirmation: positive ETCO2 and breath sounds checked- equal and bilateral Tube secured with: Tape Dental Injury: Teeth and Oropharynx as per pre-operative assessment

## 2015-04-10 ENCOUNTER — Telehealth: Payer: Self-pay | Admitting: Vascular Surgery

## 2015-04-10 ENCOUNTER — Encounter (HOSPITAL_COMMUNITY): Payer: Self-pay | Admitting: Vascular Surgery

## 2015-04-10 ENCOUNTER — Encounter: Payer: Self-pay | Admitting: Vascular Surgery

## 2015-04-10 NOTE — Telephone Encounter (Signed)
LM for pt with his spouse, dpm

## 2015-04-10 NOTE — Telephone Encounter (Signed)
-----   Message from Mena Goes, RN sent at 04/09/2015  2:26 PM EST ----- Regarding: schedule   ----- Message -----    From: Ulyses Amor, PA-C    Sent: 04/09/2015  12:28 PM      To: [facility] Charge Pool  S/p fistula plication and repair f/u with dr. [name] in 2 weeks

## 2015-04-14 ENCOUNTER — Encounter: Payer: Self-pay | Admitting: Vascular Surgery

## 2015-04-14 ENCOUNTER — Ambulatory Visit (INDEPENDENT_AMBULATORY_CARE_PROVIDER_SITE_OTHER): Payer: Medicare Other | Admitting: Cardiovascular Disease

## 2015-04-14 ENCOUNTER — Encounter: Payer: Self-pay | Admitting: Cardiovascular Disease

## 2015-04-14 VITALS — BP 162/72 | HR 60 | Ht 75.0 in | Wt 162.0 lb

## 2015-04-14 DIAGNOSIS — N186 End stage renal disease: Secondary | ICD-10-CM | POA: Diagnosis not present

## 2015-04-14 DIAGNOSIS — I1310 Hypertensive heart and chronic kidney disease without heart failure, with stage 1 through stage 4 chronic kidney disease, or unspecified chronic kidney disease: Secondary | ICD-10-CM | POA: Diagnosis not present

## 2015-04-14 DIAGNOSIS — I25118 Atherosclerotic heart disease of native coronary artery with other forms of angina pectoris: Secondary | ICD-10-CM | POA: Diagnosis not present

## 2015-04-14 DIAGNOSIS — I5022 Chronic systolic (congestive) heart failure: Secondary | ICD-10-CM

## 2015-04-14 DIAGNOSIS — R0609 Other forms of dyspnea: Secondary | ICD-10-CM | POA: Diagnosis not present

## 2015-04-14 DIAGNOSIS — E785 Hyperlipidemia, unspecified: Secondary | ICD-10-CM

## 2015-04-14 MED ORDER — AMLODIPINE BESYLATE 5 MG PO TABS: 5.0000 mg | ORAL_TABLET | Freq: Every day | ORAL | Status: DC

## 2015-04-14 NOTE — Progress Notes (Signed)
Patient ID: [name], male   DOB: 01-17-1946, 70 y.o.   MRN: [id number]      SUBJECTIVE: The patient returns for follow up of CAD, chronic systolic heart failure, hyperlipidemia, and HTN. He has a history of multiple TIAs and takes aspirin 81 mg as he is deemed to be a high risk for intracranial bleeding due to prior history of hemorrhagic strokes and he is a high fall risk.  He underwent bare-metal stent placement to the LAD in July 2013. He had moderate residual RCA disease of the mid vessel 50-60% lesion at that time.  He denies chest pain. He has had some shortness of breath with exertion over the past 1-2 weeks.  He also apparently sustained an unwitnessed fall as per his son who is a dialysis nurse visiting from [place].  He sustained a CVA in March 2016. He began hemodialysis in May 2016.  Echocardiogram on 05/24/14 showed LVEF improved to 50-55% (EF 35-40% on 10/02/11) with grade 1 diastolic dysfunction, high filling pressures, and moderate aortic regurgitation.  He recently underwent vascular surgery and had a left pseudoaneurysm plication and revision of left brachiocephalic AV fistula with bovine patch angioplasty due to imminent hemorrhage.    Review of Systems: As per "subjective", otherwise negative.  No Known Allergies  Current Outpatient Prescriptions  Medication Sig Dispense Refill  . aspirin EC 81 MG tablet Take 81 mg by mouth daily.    Marland Kitchen atorvastatin (LIPITOR) 80 MG tablet TAKE 1 TABLET (80 MG TOTAL) BY MOUTH DAILY AT 6 PM. 90 tablet 3  . b complex-vitamin c-folic acid (NEPHRO-VITE) 0.8 MG TABS tablet Take 1 tablet by mouth daily.  11  . carvedilol (COREG) 3.125 MG tablet Take 1 tablet (3.125 mg total) by mouth 2 (two) times daily. (Patient taking differently: Take 3.125 mg by mouth daily as needed (HR >60BPM). **May take an additional tab as needed based on BP levels) 60 tablet 6  . citalopram (CELEXA) 10 MG tablet TAKE 1 TABLET BY MOUTH EVERY DAY 30 tablet 2  .  lidocaine-prilocaine (EMLA) cream Apply 1 application topically as needed.  6  . pantoprazole (PROTONIX) 40 MG tablet TAKE 1 TABLET (40 MG TOTAL) BY MOUTH DAILY. 30 tablet 11  . polyethylene glycol (MIRALAX / GLYCOLAX) packet Take 17 g by mouth daily. (Patient taking differently: Take 17 g by mouth daily as needed for mild constipation. ) 14 each 0  . VOLTAREN 1 % GEL Apply 4 g topically daily as needed (for knee pain). Bilateral Knee  1  . ZETIA 10 MG tablet TAKE 1 TABLET (10 MG TOTAL) BY MOUTH DAILY. 30 tablet 6  . [DISCONTINUED] metoprolol (TOPROL-XL) 50 MG 24 hr tablet Take 50 mg by mouth daily.      . [DISCONTINUED] niacin (NIASPAN) 500 MG CR tablet Take 500 mg by mouth at bedtime.      . [DISCONTINUED] omeprazole (PRILOSEC) 20 MG capsule 1 po every morning 30 capsule 5   No current facility-administered medications for this visit.    Past Medical History  Diagnosis Date  . Hypertension   . COPD (chronic obstructive pulmonary disease) ([place])   . Hyperlipidemia   . Cardiomyopathy     EF of 30% per echo in June of 2012; admitted with congestive heart failure; nonobstructive CAD on cath in 08/2010  . History of noncompliance with medical treatment   . Cerebrovascular disease 2011    CVA  in 02/2010-only deficit is decreased vision in  right eye  .  Alcohol abuse     wife states he was never heavy drinker  . Tobacco abuse     40 pack years  . Abdominal aortic aneurysm ([place])     Stent graft repair  . Chronic systolic CHF (congestive heart failure) ([place])   . Leg pain   . CKD (chronic kidney disease) stage 3, GFR 30-59 ml/min     creatinine-1.7 in 08/2010  . CAD (coronary artery disease)   . Myocardial infarction acute ([place]) 10/01/11  . CKD (chronic kidney disease), stage III 12/06/2012  . Effusion of right knee joint 12/06/2012  . Anemia   . Atrial fibrillation ([place])   . Ataxic gait   . Weakness generalized   . Frequent falls   . Forgetfulness   . Stroke [facility]) 2011    decreased  vision of right eye    Most recent 05/23/14 - bleed - has a drag to his left leg  . Depression   . Anxiety     Takes Citalopram  . Arthritis     knees  . Renal insufficiency   . Shortness of breath dyspnea     sometimes after dialysis  . Dementia     early onset  . Enlarged prostate   . GERD (gastroesophageal reflux disease)     Past Surgical History  Procedure Laterality Date  . Colonoscopy w/ polypectomy  2006    Dr. Tamala Julian: anal fissure, sessile adenomatous polyp, diverticulosis  . Esophagogastroduodenoscopy  11/15/2010    Procedure: ESOPHAGOGASTRODUODENOSCOPY (EGD);  Surgeon: Dorothyann Peng, MD;  [facility];  Service: Endoscopy;  Laterality: N/A;  with propofol sedation; procedure start @ 0813  . Flexible sigmoidoscopy  11/15/2010    Procedure: FLEXIBLE SIGMOIDOSCOPY;  Surgeon: Dorothyann Peng, MD;  Location: [facility];  Service: Endoscopy;  Laterality: N/A;  with propofol sedation; ended @ 0808  . Polypectomy  12/13/2010    Procedure: POLYPECTOMY;  Surgeon: Dorothyann Peng, MD;  [facility];  Service: Endoscopy;  Laterality: N/A;  right colon, cecal, transverse, and sigmoid polypectomy  . Cardiac catheterization  10/04/11    Normal left main, 95% pLAD stenosis with ulceration s/p successful BMS placement, 30% segmental plaque beyond this, dLAD after D2 with 50% plaquing, then focal 70% lesion, 80% focal short D2 stenosis, 30% pOM2 lesion, 30-40% mOM3 stenosis, Shephard's crook region of RCA with 50% lesion, mRCA 50-60% lesion and mild dRCA irregularities.   . Coronary stent placement  10/04/11  . Abdominal aortic aneurysm repair w/ endoluminal graft      01/16/2008  . Left heart catheterization with coronary angiogram N/A 10/04/2011    Procedure: LEFT HEART CATHETERIZATION WITH CORONARY ANGIOGRAM;  Surgeon: Hillary Bow, MD;  Location: [facility] CATH LAB;  Service: Cardiovascular;  Laterality: N/A;  . Av fistula placement Left 07/07/2014    Procedure: Creation of Left Arm  ARTERIOVENOUS Fistula;  Surgeon: Rosetta Posner, MD;  Location: [place];  Service: Vascular;  Laterality: Left;  . Insertion of dialysis catheter Right 08/05/2014    Procedure: INSERTION OF DIALYSIS CATHETER Right internal Jugular;  Surgeon: Conrad Dubois, MD;  Location: [place];  Service: Vascular;  Laterality: Right;  . Port-a-cath removal    . Peripheral vascular catheterization Left 01/15/2015    Procedure: Fistulagram;  Surgeon: Conrad Cle Elum, MD;  Location: [place] CV LAB;  Service: Cardiovascular;  Laterality: Left;  arm  . Peripheral vascular catheterization Left 01/15/2015    Procedure: Peripheral Vascular Intervention;  Surgeon: Conrad ,  MD;  Location: [place] CV LAB;  Service: Cardiovascular;  Laterality: Left;  ARM VEINOUS PTA  . Insertion of dialysis catheter Left 02/13/2015    Procedure: INSERTION OF DIALYSIS CATHETER AND CENTRAL VENOGRAM;  Surgeon: Serafina Mitchell, MD;  Location: [place];  Service: Vascular;  Laterality: Left;  . Peripheral vascular catheterization N/A 04/07/2015    Procedure: A/V Shuntogram/Fistulagram;  Surgeon: Conrad Twining, MD;  Location: [place] CV LAB;  Service: Cardiovascular;  Laterality: N/A;  . Revison of arteriovenous fistula Left [id number]    Procedure: PLICATION OF LEFT ARM PSEUDOANEURYSM;  Surgeon: Conrad Gerlach, MD;  Location: [place];  Service: Vascular;  Laterality: Left;  . Patch angioplasty Left 04/09/2015    Procedure: PATCH ANGIOPLASTY;  Surgeon: Conrad Festus, MD;  Location: [place];  Service: Vascular;  Laterality: Left;    Social History   Social History  . Marital Status: Married    Spouse Name: N/A  . Number of Children: N/A  . Years of Education: N/A   Occupational History  . retired     [employer]   Social History Main Topics  . Smoking status: Former Smoker -- 0.50 packs/day for 50 years    Types: Cigarettes    Start date: 06/25/1957    Quit date: 03/04/2013  . Smokeless tobacco: Never Used  . Alcohol Use: No      Comment: None for several years  . Drug Use: No  . Sexual Activity: No   Other Topics Concern  . Not on file   Social History Narrative   Married, retired from [employer], 6 children   Right handed   Caffeine use - none     Filed Vitals:   04/14/15 0932  BP: 162/72  Pulse: 60  Height: 6\' 3"  (1.905 m)  Weight: 162 lb (73.483 kg)    PHYSICAL EXAM General: NAD HEENT: Normal. Neck: No JVD, no thyromegaly. Lungs: Clear to auscultation bilaterally with normal respiratory effort. CV: Nondisplaced PMI. Regular rate and rhythm, normal S1/S2, no S3/S4, no murmur. No pretibial or periankle edema.No carotid bruits. Radial pulses 2+ b/l.  Abdomen: Soft, nontender, no distention.  Neurologic: Alert and oriented x 3.  Psych: Normal affect. Extremities: No clubbing or cyanosis.   ECG: Most recent ECG reviewed.      ASSESSMENT AND PLAN: 1. Exertional dyspnea with CAD: Stable ischemic heart disease with mild exertional dypsnea. Continue aspirin and Lipitor. He is taking Coreg prn due to HR in 60's. I will stop Coreg and start amlodipine 5 mg for BP control and to see if this alleviates exertional dyspnea, in order to see if this is an anginal equivalent given residual RCA disease as noted above.  2. Malignant HTN: Elevated.  I will stop Coreg and start amlodipine 5 mg for BP control.  3. Chronic systolic heart failure: Euvolemic and stable. LVEF improved to 50-55% as noted above.  4. Hyperlipidemia: Lipids on 09/18/14 showed total cholesterol 133, triglycerides 50, HDL 53, LDL 70. Continue current therapy with Lipitor 80 mg and Zetia 10 mg.  5. ESRD on hemodialysis.   Dispo: f/u 4 months.   Kate Sable, M.D., F.A.C.C.

## 2015-04-14 NOTE — Patient Instructions (Addendum)
   Stop Coreg.  Begin Amlodipine 5mg  daily - new sent to [facility] today.   Continue all other medications.   Follow up in  4 months.

## 2015-04-15 ENCOUNTER — Encounter (HOSPITAL_COMMUNITY)
Admission: RE | Disposition: A | Discharge status: Home / Self Care | Payer: Self-pay | Source: Ambulatory Visit | Attending: Vascular Surgery

## 2015-04-15 ENCOUNTER — Ambulatory Visit (INDEPENDENT_AMBULATORY_CARE_PROVIDER_SITE_OTHER): Payer: Medicare Other | Admitting: Vascular Surgery

## 2015-04-15 ENCOUNTER — Encounter: Payer: Self-pay | Admitting: Vascular Surgery

## 2015-04-15 ENCOUNTER — Encounter (HOSPITAL_COMMUNITY): Payer: Self-pay | Admitting: *Deleted

## 2015-04-15 ENCOUNTER — Other Ambulatory Visit: Payer: Self-pay

## 2015-04-15 ENCOUNTER — Ambulatory Visit (HOSPITAL_COMMUNITY)
Admission: RE | Admit: 2015-04-15 | Discharge: 2015-04-15 | Disposition: A | Discharge status: Home / Self Care | Payer: Medicare Other | Source: Ambulatory Visit | Attending: Vascular Surgery | Admitting: Vascular Surgery

## 2015-04-15 ENCOUNTER — Ambulatory Visit (HOSPITAL_COMMUNITY): Payer: Medicare Other | Admitting: Certified Registered Nurse Anesthetist

## 2015-04-15 VITALS — BP 163/76 | HR 68 | Temp 97.3°F | Resp 20 | Ht 75.0 in | Wt 162.0 lb

## 2015-04-15 DIAGNOSIS — K219 Gastro-esophageal reflux disease without esophagitis: Secondary | ICD-10-CM | POA: Diagnosis not present

## 2015-04-15 DIAGNOSIS — Z7982 Long term (current) use of aspirin: Secondary | ICD-10-CM | POA: Insufficient documentation

## 2015-04-15 DIAGNOSIS — M7989 Other specified soft tissue disorders: Secondary | ICD-10-CM | POA: Insufficient documentation

## 2015-04-15 DIAGNOSIS — H5461 Unqualified visual loss, right eye, normal vision left eye: Secondary | ICD-10-CM | POA: Diagnosis not present

## 2015-04-15 DIAGNOSIS — N186 End stage renal disease: Secondary | ICD-10-CM | POA: Insufficient documentation

## 2015-04-15 DIAGNOSIS — I5022 Chronic systolic (congestive) heart failure: Secondary | ICD-10-CM | POA: Diagnosis not present

## 2015-04-15 DIAGNOSIS — Z87891 Personal history of nicotine dependence: Secondary | ICD-10-CM | POA: Insufficient documentation

## 2015-04-15 DIAGNOSIS — I132 Hypertensive heart and chronic kidney disease with heart failure and with stage 5 chronic kidney disease, or end stage renal disease: Secondary | ICD-10-CM | POA: Insufficient documentation

## 2015-04-15 DIAGNOSIS — F329 Major depressive disorder, single episode, unspecified: Secondary | ICD-10-CM | POA: Diagnosis not present

## 2015-04-15 DIAGNOSIS — Y832 Surgical operation with anastomosis, bypass or graft as the cause of abnormal reaction of the patient, or of later complication, without mention of misadventure at the time of the procedure: Secondary | ICD-10-CM | POA: Diagnosis not present

## 2015-04-15 DIAGNOSIS — I2510 Atherosclerotic heart disease of native coronary artery without angina pectoris: Secondary | ICD-10-CM | POA: Diagnosis not present

## 2015-04-15 DIAGNOSIS — J449 Chronic obstructive pulmonary disease, unspecified: Secondary | ICD-10-CM | POA: Diagnosis not present

## 2015-04-15 DIAGNOSIS — Z992 Dependence on renal dialysis: Secondary | ICD-10-CM | POA: Diagnosis not present

## 2015-04-15 DIAGNOSIS — F1010 Alcohol abuse, uncomplicated: Secondary | ICD-10-CM | POA: Diagnosis not present

## 2015-04-15 DIAGNOSIS — I4891 Unspecified atrial fibrillation: Secondary | ICD-10-CM | POA: Diagnosis not present

## 2015-04-15 DIAGNOSIS — F0390 Unspecified dementia without behavioral disturbance: Secondary | ICD-10-CM | POA: Insufficient documentation

## 2015-04-15 DIAGNOSIS — I252 Old myocardial infarction: Secondary | ICD-10-CM | POA: Diagnosis not present

## 2015-04-15 DIAGNOSIS — Z9119 Patient's noncompliance with other medical treatment and regimen: Secondary | ICD-10-CM | POA: Insufficient documentation

## 2015-04-15 DIAGNOSIS — T82838A Hemorrhage of vascular prosthetic devices, implants and grafts, initial encounter: Secondary | ICD-10-CM | POA: Insufficient documentation

## 2015-04-15 DIAGNOSIS — F419 Anxiety disorder, unspecified: Secondary | ICD-10-CM | POA: Diagnosis not present

## 2015-04-15 DIAGNOSIS — I69398 Other sequelae of cerebral infarction: Secondary | ICD-10-CM | POA: Diagnosis not present

## 2015-04-15 DIAGNOSIS — E785 Hyperlipidemia, unspecified: Secondary | ICD-10-CM | POA: Diagnosis not present

## 2015-04-15 DIAGNOSIS — Z79899 Other long term (current) drug therapy: Secondary | ICD-10-CM | POA: Diagnosis not present

## 2015-04-15 DIAGNOSIS — M170 Bilateral primary osteoarthritis of knee: Secondary | ICD-10-CM | POA: Insufficient documentation

## 2015-04-15 DIAGNOSIS — Z955 Presence of coronary angioplasty implant and graft: Secondary | ICD-10-CM | POA: Diagnosis not present

## 2015-04-15 DIAGNOSIS — Z48812 Encounter for surgical aftercare following surgery on the circulatory system: Secondary | ICD-10-CM

## 2015-04-15 DIAGNOSIS — I429 Cardiomyopathy, unspecified: Secondary | ICD-10-CM | POA: Diagnosis not present

## 2015-04-15 HISTORY — PX: LIGATION OF ARTERIOVENOUS  FISTULA: SHX5948

## 2015-04-15 LAB — POCT I-STAT 4, (NA,K, GLUC, HGB,HCT)
GLUCOSE: 81 mg/dL (ref 65–99)
Glucose, Bld: 66 mg/dL (ref 65–99)
HCT: 34 % — ABNORMAL LOW (ref 39.0–52.0)
HEMATOCRIT: 37 % — AB (ref 39.0–52.0)
HEMOGLOBIN: 11.6 g/dL — AB (ref 13.0–17.0)
Hemoglobin: 12.6 g/dL — ABNORMAL LOW (ref 13.0–17.0)
POTASSIUM: 4.4 mmol/L (ref 3.5–5.1)
POTASSIUM: 7.2 mmol/L — AB (ref 3.5–5.1)
SODIUM: 140 mmol/L (ref 135–145)
Sodium: 142 mmol/L (ref 135–145)

## 2015-04-15 SURGERY — LIGATION OF ARTERIOVENOUS  FISTULA
Anesthesia: Monitor Anesthesia Care | Laterality: Left

## 2015-04-15 MED ORDER — PROPOFOL 500 MG/50ML IV EMUL
INTRAVENOUS | Status: DC | PRN
  Administered 2015-04-15: 100 ug/kg/min via INTRAVENOUS

## 2015-04-15 MED ORDER — 0.9 % SODIUM CHLORIDE (POUR BTL) OPTIME
TOPICAL | Status: DC | PRN
  Administered 2015-04-15: 1000 mL

## 2015-04-15 MED ORDER — SODIUM CHLORIDE 0.9 % IV SOLN
INTRAVENOUS | Status: DC
  Administered 2015-04-15 (×2): via INTRAVENOUS

## 2015-04-15 MED ORDER — ONDANSETRON HCL 4 MG/2ML IJ SOLN
INTRAMUSCULAR | Status: DC | PRN
  Administered 2015-04-15: 4 mg via INTRAVENOUS

## 2015-04-15 MED ORDER — FENTANYL CITRATE (PF) 250 MCG/5ML IJ SOLN
INTRAMUSCULAR | Status: AC
  Filled 2015-04-15: qty 5

## 2015-04-15 MED ORDER — DEXTROSE 5 % IV SOLN
1.5000 g | INTRAVENOUS | Status: AC
  Administered 2015-04-15: 1.5 g via INTRAVENOUS

## 2015-04-15 MED ORDER — OXYCODONE-ACETAMINOPHEN 5-325 MG PO TABS: 1.0000 | ORAL_TABLET | Freq: Four times a day (QID) | ORAL | Status: DC | PRN

## 2015-04-15 MED ORDER — LIDOCAINE-EPINEPHRINE (PF) 1 %-1:200000 IJ SOLN
INTRAMUSCULAR | Status: DC | PRN
  Administered 2015-04-15: 5 mL

## 2015-04-15 MED ORDER — LIDOCAINE-EPINEPHRINE (PF) 1 %-1:200000 IJ SOLN
INTRAMUSCULAR | Status: AC
  Filled 2015-04-15: qty 30

## 2015-04-15 MED ORDER — CHLORHEXIDINE GLUCONATE CLOTH 2 % EX PADS: 6.0000 | MEDICATED_PAD | Freq: Once | CUTANEOUS | Status: DC

## 2015-04-15 MED ORDER — MIDAZOLAM HCL 2 MG/2ML IJ SOLN
INTRAMUSCULAR | Status: AC
  Filled 2015-04-15: qty 2

## 2015-04-15 MED ORDER — DEXTROSE 5 % IV SOLN
INTRAVENOUS | Status: AC
  Filled 2015-04-15: qty 1.5

## 2015-04-15 MED ORDER — FENTANYL CITRATE (PF) 100 MCG/2ML IJ SOLN
INTRAMUSCULAR | Status: DC | PRN
  Administered 2015-04-15: 50 ug via INTRAVENOUS

## 2015-04-15 MED ORDER — FENTANYL CITRATE (PF) 100 MCG/2ML IJ SOLN: 25.0000 ug | INTRAMUSCULAR | Status: DC | PRN

## 2015-04-15 MED ORDER — MEPERIDINE HCL 25 MG/ML IJ SOLN: 6.2500 mg | INTRAMUSCULAR | Status: DC | PRN

## 2015-04-15 SURGICAL SUPPLY — 35 items
ADH SKN CLS APL DERMABOND .7 (GAUZE/BANDAGES/DRESSINGS) ×1
BNDG GAUZE ELAST 4 BULKY (GAUZE/BANDAGES/DRESSINGS) ×2 IMPLANT
CANISTER SUCTION 2500CC (MISCELLANEOUS) ×3 IMPLANT
CLIP TI MEDIUM 6 (CLIP) IMPLANT
CLIP TI WIDE RED SMALL 6 (CLIP) IMPLANT
DERMABOND ADVANCED (GAUZE/BANDAGES/DRESSINGS) ×2
DERMABOND ADVANCED .7 DNX12 (GAUZE/BANDAGES/DRESSINGS) IMPLANT
ELECT REM PT RETURN 9FT ADLT (ELECTROSURGICAL) ×3
ELECTRODE REM PT RTRN 9FT ADLT (ELECTROSURGICAL) ×1 IMPLANT
GAUZE SPONGE 4X4 12PLY STRL (GAUZE/BANDAGES/DRESSINGS) ×3 IMPLANT
GEL ULTRASOUND 20GR AQUASONIC (MISCELLANEOUS) IMPLANT
GLOVE BIO SURGEON STRL SZ 6.5 (GLOVE) ×2 IMPLANT
GLOVE BIO SURGEONS STRL SZ 6.5 (GLOVE) ×2
GLOVE BIOGEL PI IND STRL 6.5 (GLOVE) ×2 IMPLANT
GLOVE BIOGEL PI INDICATOR 6.5 (GLOVE) ×4
GLOVE SS BIOGEL STRL SZ 7 (GLOVE) ×1 IMPLANT
GLOVE SUPERSENSE BIOGEL SZ 7 (GLOVE) ×2
GOWN STRL REUS W/ TWL LRG LVL3 (GOWN DISPOSABLE) ×3 IMPLANT
GOWN STRL REUS W/TWL LRG LVL3 (GOWN DISPOSABLE) ×9
KIT BASIN OR (CUSTOM PROCEDURE TRAY) ×3 IMPLANT
KIT ROOM TURNOVER OR (KITS) ×3 IMPLANT
LIQUID BAND (GAUZE/BANDAGES/DRESSINGS) ×3 IMPLANT
NS IRRIG 1000ML POUR BTL (IV SOLUTION) ×3 IMPLANT
PACK CV ACCESS (CUSTOM PROCEDURE TRAY) ×3 IMPLANT
PAD ARMBOARD 7.5X6 YLW CONV (MISCELLANEOUS) ×6 IMPLANT
SPONGE GAUZE 4X4 12PLY STER LF (GAUZE/BANDAGES/DRESSINGS) ×2 IMPLANT
SUT PROLENE 6 0 BV (SUTURE) IMPLANT
SUT SILK 0 TIES 10X30 (SUTURE) ×3 IMPLANT
SUT SILK 1 TIES 10X30 (SUTURE) ×2 IMPLANT
SUT VIC AB 3-0 SH 27 (SUTURE) ×3
SUT VIC AB 3-0 SH 27X BRD (SUTURE) ×1 IMPLANT
SWAB COLLECTION DEVICE MRSA (MISCELLANEOUS) IMPLANT
TUBE ANAEROBIC SPECIMEN COL (MISCELLANEOUS) IMPLANT
UNDERPAD 30X30 INCONTINENT (UNDERPADS AND DIAPERS) ×3 IMPLANT
WATER STERILE IRR 1000ML POUR (IV SOLUTION) ×1 IMPLANT

## 2015-04-15 NOTE — Anesthesia Preprocedure Evaluation (Addendum)
Anesthesia Evaluation    Airway Mallampati: I  TM Distance: >3 FB Neck ROM: Full    Dental  (+) Teeth Intact, Dental Advisory Given   Pulmonary former smoker,    breath sounds clear to auscultation       Cardiovascular hypertension, Pt. on medications + CAD and +CHF   Rhythm:Regular Rate:Normal     Neuro/Psych CVA    GI/Hepatic GERD  Medicated and Controlled,  Endo/Other    Renal/GU DialysisRenal disease     Musculoskeletal   Abdominal   Peds  Hematology   Anesthesia Other Findings   Reproductive/Obstetrics                            Anesthesia Physical Anesthesia Plan  ASA: IV  Anesthesia Plan: MAC   Post-op Pain Management:    Induction: Intravenous  Airway Management Planned: Simple Face Mask  Additional Equipment:   Intra-op Plan:   Post-operative Plan: Extubation in OR  Informed Consent: I have reviewed the patients History and Physical, chart, labs and discussed the procedure including the risks, benefits and alternatives for the proposed anesthesia with the patient or authorized representative who has indicated his/her understanding and acceptance.   Dental advisory given  Plan Discussed with: CRNA, Anesthesiologist and Surgeon  Anesthesia Plan Comments:        Anesthesia Quick Evaluation

## 2015-04-15 NOTE — Op Note (Signed)
OPERATIVE REPORT  Date of Surgery: 04/15/2015  Surgeon: Tinnie Gens, MD  Assistant: Pervis Hocking PA  Pre-op Diagnosis: Bleeding from Left brachial-cephalic AV Fistula  Post-op Diagnosis: Bleeding from Left brachial-cephalic AV Fistula  Procedure: Procedure(s): LIGATION OF BRACHIOCEPHALIC ARTERIOVENOUS  FISTULA-left upper extremity  Anesthesia: Mac  EBL: Minimal  Complications: None  Procedure Details: The patient was taken the operative room placed in supine position at which time the left upper extremity was prepped with Betadine scrub and solution draped in routine sterile manner. There was a left brachial-cephalic AV fistula which had been surgically revised in the mid upper arm about one week ago for aneurysmal changes and he had then developed further hemorrhage today. Decided to ligate the fistula at the brachial artery anastomosis to prevent serious hemorrhage. After infiltration of 1% Xylocaine with epinephrine short transverse incision was made over the cephalic vein and brachial artery anastomosis through the previous scar. This carried down to subtenon's tissue the vein was very dilated and pulsatile. It was dissected free circumferentially back to the anastomosis to the brachial artery. It was clamped temporarily in this improved radial arterial flow distally and also eliminated any flow through the fistula. It was then ligated with 2-#1 silk ties and patient continued to have excellent Doppler flow in the radial artery and no flow in the fistula. Following this adequate hemostasis was achieved the wound closed in layers with Vicryl subcuticular fashion with Dermabond patient taken recovery room in satisfactory condition   Tinnie Gens, MD 04/15/2015 4:37 PM

## 2015-04-15 NOTE — Interval H&P Note (Signed)
History and Physical Interval Note:  04/15/2015 3:04 PM  [name]  has presented today for surgery, with the diagnosis of Bleeding left arm brachiocephalic arteriovenous fistula T82.838 D; End Stage Renal Disease N18.6  The various methods of treatment have been discussed with the patient and family. After consideration of risks, benefits and other options for treatment, the patient has consented to  Procedure(s): LIGATION OF BRACHIOCEPHALIC ARTERIOVENOUS  FISTULA (Left) as a surgical intervention .  The patient's history has been reviewed, patient examined, no change in status, stable for surgery.  I have reviewed the patient's chart and labs.  Questions were answered to the patient's satisfaction.     Tinnie Gens

## 2015-04-15 NOTE — Progress Notes (Signed)
Brief History and Physical  History of Present Illness  [name] is a 70 y.o. male who presents with chief complaint: bleeding from recent surgery.  This patient previous developed a large pseudoaneurysm related to cannulation injury in the left upper arm.  He went to the OR on Q000111Q for plication of left brachiocephalic arteriovenous fistula pseudoaneurysm.  I found intraoperatively a 2 mm hole in the fistula with adjacent hole, so I joined the two defects and sewed a bovine patch onto the fistula to try to salvage the fistula.  Unfortunately, this AM, this patient had recurrence of the large pulsatile mass in the left upper arm and some bleeding through the suture line.  Past Medical History  Diagnosis Date  . Hypertension   . COPD (chronic obstructive pulmonary disease) ([place])   . Hyperlipidemia   . Cardiomyopathy     EF of 30% per echo in June of 2012; admitted with congestive heart failure; nonobstructive CAD on cath in 08/2010  . History of noncompliance with medical treatment   . Cerebrovascular disease 2011    CVA  in 02/2010-only deficit is decreased vision in  right eye  . Alcohol abuse     wife states he was never heavy drinker  . Tobacco abuse     40 pack years  . Abdominal aortic aneurysm ([place])     Stent graft repair  . Chronic systolic CHF (congestive heart failure) ([place])   . Leg pain   . CKD (chronic kidney disease) stage 3, GFR 30-59 ml/min     creatinine-1.7 in 08/2010  . CAD (coronary artery disease)   . Myocardial infarction acute ([place]) 10/01/11  . CKD (chronic kidney disease), stage III 12/06/2012  . Effusion of right knee joint 12/06/2012  . Anemia   . Atrial fibrillation ([place])   . Ataxic gait   . Weakness generalized   . Frequent falls   . Forgetfulness   . Stroke [facility]) 2011    decreased vision of right eye    Most recent 05/23/14 - bleed - has a drag to his left leg  . Depression   . Anxiety     Takes Citalopram  . Arthritis     knees  . Renal  insufficiency   . Shortness of breath dyspnea     sometimes after dialysis  . Dementia     early onset  . Enlarged prostate   . GERD (gastroesophageal reflux disease)     Past Surgical History  Procedure Laterality Date  . Colonoscopy w/ polypectomy  2006    Dr. Tamala Julian: anal fissure, sessile adenomatous polyp, diverticulosis  . Esophagogastroduodenoscopy  11/15/2010    Procedure: ESOPHAGOGASTRODUODENOSCOPY (EGD);  Surgeon: Dorothyann Peng, MD;  [facility];  Service: Endoscopy;  Laterality: N/A;  with propofol sedation; procedure start @ 0813  . Flexible sigmoidoscopy  11/15/2010    Procedure: FLEXIBLE SIGMOIDOSCOPY;  Surgeon: Dorothyann Peng, MD;  [facility]: [facility];  Service: Endoscopy;  Laterality: N/A;  with propofol sedation; ended @ 0808  . Polypectomy  12/13/2010    Procedure: POLYPECTOMY;  Surgeon: Dorothyann Peng, MD;  [facility];  Service: Endoscopy;  Laterality: N/A;  right colon, cecal, transverse, and sigmoid polypectomy  . Cardiac catheterization  10/04/11    Normal left main, 95% pLAD stenosis with ulceration s/p successful BMS placement, 30% segmental plaque beyond this, dLAD after D2 with 50% plaquing, then focal 70% lesion, 80% focal short D2 stenosis, 30% pOM2 lesion, 30-40%  mOM3 stenosis, Shephard's crook region of RCA with 50% lesion, mRCA 50-60% lesion and mild dRCA irregularities.   . Coronary stent placement  10/04/11  . Abdominal aortic aneurysm repair w/ endoluminal graft      01/16/2008  . Left heart catheterization with coronary angiogram N/A 10/04/2011    Procedure: LEFT HEART CATHETERIZATION WITH CORONARY ANGIOGRAM;  Surgeon: Hillary Bow, MD;  Location: [facility] CATH LAB;  Service: Cardiovascular;  Laterality: N/A;  . Av fistula placement Left 07/07/2014    Procedure: Creation of Left Arm ARTERIOVENOUS Fistula;  Surgeon: Rosetta Posner, MD;  Location: [place];  Service: Vascular;  Laterality: Left;  . Insertion of dialysis catheter Right 08/05/2014    Procedure:  INSERTION OF DIALYSIS CATHETER Right internal Jugular;  Surgeon: Conrad Julian, MD;  Location: [place];  Service: Vascular;  Laterality: Right;  . Port-a-cath removal    . Peripheral vascular catheterization Left 01/15/2015    Procedure: Fistulagram;  Surgeon: Conrad Kalaeloa, MD;  Location: [place] CV LAB;  Service: Cardiovascular;  Laterality: Left;  arm  . Peripheral vascular catheterization Left 01/15/2015    Procedure: Peripheral Vascular Intervention;  Surgeon: Conrad [place], MD;  Location: [place] CV LAB;  Service: Cardiovascular;  Laterality: Left;  ARM VEINOUS PTA  . Insertion of dialysis catheter Left 02/13/2015    Procedure: INSERTION OF DIALYSIS CATHETER AND CENTRAL VENOGRAM;  Surgeon: Serafina Mitchell, MD;  Location: [place];  Service: Vascular;  Laterality: Left;  . Peripheral vascular catheterization N/A 04/07/2015    Procedure: A/V Shuntogram/Fistulagram;  Surgeon: Conrad [place], MD;  Location: [place] CV LAB;  Service: Cardiovascular;  Laterality: N/A;  . Revison of arteriovenous fistula Left [id number]    Procedure: PLICATION OF LEFT ARM PSEUDOANEURYSM;  Surgeon: Conrad Mendes, MD;  Location: [place];  Service: Vascular;  Laterality: Left;  . Patch angioplasty Left 04/09/2015    Procedure: PATCH ANGIOPLASTY;  Surgeon: Conrad [place], MD;  Location: [place];  Service: Vascular;  Laterality: Left;    Social History   Social History  . Marital Status: Married    Spouse Name: N/A  . Number of Children: N/A  . Years of Education: N/A   Occupational History  . retired     [employer]   Social History Main Topics  . Smoking status: Former Smoker -- 0.50 packs/day for 50 years    Types: Cigarettes    Start date: 06/25/1957    Quit date: 03/04/2013  . Smokeless tobacco: Never Used  . Alcohol Use: No     Comment: None for several years  . Drug Use: No  . Sexual Activity: No   Other Topics Concern  . Not on file   Social History Narrative   Married, retired from [employer], 6 children   Right handed   Caffeine use - none    Family History  Problem Relation Age of Onset  . Colon cancer Neg Hx   . Anesthesia problems Neg Hx   . Hypotension Neg Hx   . Malignant hyperthermia Neg Hx   . Pseudochol deficiency Neg Hx   . Cancer Mother     Gall Bladder  . Heart disease Mother     before age 16  . Hyperlipidemia Mother   . Hypertension Mother   . Asthma Mother   . Cancer Sister     Cervical    Current Outpatient Prescriptions on File Prior to Visit  Medication Sig Dispense Refill  .  amLODipine (NORVASC) 5 MG tablet Take 1 tablet (5 mg total) by mouth daily. 30 tablet 6  . aspirin EC 81 MG tablet Take 81 mg by mouth daily.    Marland Kitchen atorvastatin (LIPITOR) 80 MG tablet TAKE 1 TABLET (80 MG TOTAL) BY MOUTH DAILY AT 6 PM. 90 tablet 3  . b complex-vitamin c-folic acid (NEPHRO-VITE) 0.8 MG TABS tablet Take 1 tablet by mouth daily.  11  . citalopram (CELEXA) 10 MG tablet TAKE 1 TABLET BY MOUTH EVERY DAY 30 tablet 2  . lidocaine-prilocaine (EMLA) cream Apply 1 application topically as needed.  6  . pantoprazole (PROTONIX) 40 MG tablet TAKE 1 TABLET (40 MG TOTAL) BY MOUTH DAILY. 30 tablet 11  . polyethylene glycol (MIRALAX / GLYCOLAX) packet Take 17 g by mouth daily. (Patient taking differently: Take 17 g by mouth daily as needed for mild constipation. ) 14 each 0  . VOLTAREN 1 % GEL Apply 4 g topically daily as needed (for knee pain). Bilateral Knee  1  . ZETIA 10 MG tablet TAKE 1 TABLET (10 MG TOTAL) BY MOUTH DAILY. 30 tablet 6  . [DISCONTINUED] metoprolol (TOPROL-XL) 50 MG 24 hr tablet Take 50 mg by mouth daily.      . [DISCONTINUED] niacin (NIASPAN) 500 MG CR tablet Take 500 mg by mouth at bedtime.      . [DISCONTINUED] omeprazole (PRILOSEC) 20 MG capsule 1 po every morning 30 capsule 5   No current facility-administered medications on file prior to visit.    No Known Allergies  Review of Systems: As listed above, otherwise negative.  Physical  Examination  Filed Vitals:   04/15/15 1235  BP: 163/76  Pulse: 68  Temp: 97.3 F (36.3 C)  TempSrc: Oral  Resp: 20  Height: 6\' 3"  (1.905 m)  Weight: 162 lb (73.483 kg)    General: A&O x 3, WDWN  Pulmonary: Sym exp, good air movt, CTAB, no rales, rhonchi, & wheezing  Cardiac: RRR, Nl S1, S2, no Murmurs, rubs or gallops  Gastrointestinal: soft, NTND, -G/R, - HSM, - masses, - CVAT B  Musculoskeletal: M/S 5/5 throughout , Extremities without ischemic changes, large pulsatile pseudoaneurysm in L upper arm as previously, now with some ooze at staple line  Laboratory See [name] is a 70 y.o. male who presents with: s/p L BC AVF PSA plication complicated with recurrent bleeding.   I discussed at this point that I don't think its safe to try to salvage this AVF, so I recommend: ligation of L BC AVF with Dr. [name]. The patient currently has a TDC in place, so he can complete HD via the [facility].  Once the patient recovers, will bring him back him for re-evaluation for new access.  The patient is aware of the risks and agrees to proceed.  Adele Barthel, MD [facility] [place] Office: [phone] Pager: [phone]  04/15/2015, 1:26 PM

## 2015-04-15 NOTE — Anesthesia Postprocedure Evaluation (Signed)
Anesthesia Post Note  Patient: [name]  Procedure(s) Performed: Procedure(s) (LRB): LIGATION OF BRACHIOCEPHALIC ARTERIOVENOUS  FISTULA (Left)  Patient location during evaluation: PACU Anesthesia Type: General Level of consciousness: awake and alert Pain management: pain level controlled Vital Signs Assessment: post-procedure vital signs reviewed and stable Respiratory status: spontaneous breathing, nonlabored ventilation and respiratory function stable Cardiovascular status: blood pressure returned to baseline and stable Postop Assessment: no signs of nausea or vomiting Anesthetic complications: no    Last Vitals:  Filed Vitals:   04/15/15 1651 04/15/15 1700  BP: 168/80 179/80  Pulse: 53 51  Temp:    Resp: 14 16    Last Pain:  Filed Vitals:   04/15/15 1705  PainSc: 0-No pain                 Micheal Sheen A

## 2015-04-15 NOTE — H&P (View-Only) (Signed)
Brief History and Physical  History of Present Illness  [name] is a 70 y.o. male who presents with chief complaint: bleeding from recent surgery.  This patient previous developed a large pseudoaneurysm related to cannulation injury in the left upper arm.  He went to the OR on Q000111Q for plication of left brachiocephalic arteriovenous fistula pseudoaneurysm.  I found intraoperatively a 2 mm hole in the fistula with adjacent hole, so I joined the two defects and sewed a bovine patch onto the fistula to try to salvage the fistula.  Unfortunately, this AM, this patient had recurrence of the large pulsatile mass in the left upper arm and some bleeding through the suture line.  Past Medical History  Diagnosis Date  . Hypertension   . COPD (chronic obstructive pulmonary disease) ([place])   . Hyperlipidemia   . Cardiomyopathy     EF of 30% per echo in June of 2012; admitted with congestive heart failure; nonobstructive CAD on cath in 08/2010  . History of noncompliance with medical treatment   . Cerebrovascular disease 2011    CVA  in 02/2010-only deficit is decreased vision in  right eye  . Alcohol abuse     wife states he was never heavy drinker  . Tobacco abuse     40 pack years  . Abdominal aortic aneurysm ([place])     Stent graft repair  . Chronic systolic CHF (congestive heart failure) ([place])   . Leg pain   . CKD (chronic kidney disease) stage 3, GFR 30-59 ml/min     creatinine-1.7 in 08/2010  . CAD (coronary artery disease)   . Myocardial infarction acute ([place]) 10/01/11  . CKD (chronic kidney disease), stage III 12/06/2012  . Effusion of right knee joint 12/06/2012  . Anemia   . Atrial fibrillation ([place])   . Ataxic gait   . Weakness generalized   . Frequent falls   . Forgetfulness   . Stroke [facility]) 2011    decreased vision of right eye    Most recent 05/23/14 - bleed - has a drag to his left leg  . Depression   . Anxiety     Takes Citalopram  . Arthritis     knees  . Renal  insufficiency   . Shortness of breath dyspnea     sometimes after dialysis  . Dementia     early onset  . Enlarged prostate   . GERD (gastroesophageal reflux disease)     Past Surgical History  Procedure Laterality Date  . Colonoscopy w/ polypectomy  2006    Dr. Tamala Julian: anal fissure, sessile adenomatous polyp, diverticulosis  . Esophagogastroduodenoscopy  11/15/2010    Procedure: ESOPHAGOGASTRODUODENOSCOPY (EGD);  Surgeon: Dorothyann Peng, MD;  [facility];  Service: Endoscopy;  Laterality: N/A;  with propofol sedation; procedure start @ 0813  . Flexible sigmoidoscopy  11/15/2010    Procedure: FLEXIBLE SIGMOIDOSCOPY;  Surgeon: Dorothyann Peng, MD;  [facility];  Service: Endoscopy;  Laterality: N/A;  with propofol sedation; ended @ 0808  . Polypectomy  12/13/2010    Procedure: POLYPECTOMY;  Surgeon: Dorothyann Peng, MD;  [facility];  Service: Endoscopy;  Laterality: N/A;  right colon, cecal, transverse, and sigmoid polypectomy  . Cardiac catheterization  10/04/11    Normal left main, 95% pLAD stenosis with ulceration s/p successful BMS placement, 30% segmental plaque beyond this, dLAD after D2 with 50% plaquing, then focal 70% lesion, 80% focal short D2 stenosis, 30% pOM2 lesion, 30-40%  mOM3 stenosis, Shephard's crook region of RCA with 50% lesion, mRCA 50-60% lesion and mild dRCA irregularities.   . Coronary stent placement  10/04/11  . Abdominal aortic aneurysm repair w/ endoluminal graft      01/16/2008  . Left heart catheterization with coronary angiogram N/A 10/04/2011    Procedure: LEFT HEART CATHETERIZATION WITH CORONARY ANGIOGRAM;  Surgeon: Hillary Bow, MD;  Location: [facility] CATH LAB;  Service: Cardiovascular;  Laterality: N/A;  . Av fistula placement Left 07/07/2014    Procedure: Creation of Left Arm ARTERIOVENOUS Fistula;  Surgeon: Rosetta Posner, MD;  Location: [place];  Service: Vascular;  Laterality: Left;  . Insertion of dialysis catheter Right 08/05/2014    Procedure:  INSERTION OF DIALYSIS CATHETER Right internal Jugular;  Surgeon: Conrad Seabeck, MD;  Location: [place];  Service: Vascular;  Laterality: Right;  . Port-a-cath removal    . Peripheral vascular catheterization Left 01/15/2015    Procedure: Fistulagram;  Surgeon: Conrad Bancroft, MD;  Location: [place] CV LAB;  Service: Cardiovascular;  Laterality: Left;  arm  . Peripheral vascular catheterization Left 01/15/2015    Procedure: Peripheral Vascular Intervention;  Surgeon: Conrad Hawthorne, MD;  Location: [place] CV LAB;  Service: Cardiovascular;  Laterality: Left;  ARM VEINOUS PTA  . Insertion of dialysis catheter Left 02/13/2015    Procedure: INSERTION OF DIALYSIS CATHETER AND CENTRAL VENOGRAM;  Surgeon: Serafina Mitchell, MD;  Location: [place];  Service: Vascular;  Laterality: Left;  . Peripheral vascular catheterization N/A 04/07/2015    Procedure: A/V Shuntogram/Fistulagram;  Surgeon: Conrad [place], MD;  Location: [place] CV LAB;  Service: Cardiovascular;  Laterality: N/A;  . Revison of arteriovenous fistula Left [id number]    Procedure: PLICATION OF LEFT ARM PSEUDOANEURYSM;  Surgeon: Conrad [place], MD;  Location: [place];  Service: Vascular;  Laterality: Left;  . Patch angioplasty Left 04/09/2015    Procedure: PATCH ANGIOPLASTY;  Surgeon: Conrad Cowles, MD;  Location: [place];  Service: Vascular;  Laterality: Left;    Social History   Social History  . Marital Status: Married    Spouse Name: N/A  . Number of Children: N/A  . Years of Education: N/A   Occupational History  . retired     [employer]   Social History Main Topics  . Smoking status: Former Smoker -- 0.50 packs/day for 50 years    Types: Cigarettes    Start date: 06/25/1957    Quit date: 03/04/2013  . Smokeless tobacco: Never Used  . Alcohol Use: No     Comment: None for several years  . Drug Use: No  . Sexual Activity: No   Other Topics Concern  . Not on file   Social History Narrative   Married, retired from [employer], 6 children   Right handed   Caffeine use - none    Family History  Problem Relation Age of Onset  . Colon cancer Neg Hx   . Anesthesia problems Neg Hx   . Hypotension Neg Hx   . Malignant hyperthermia Neg Hx   . Pseudochol deficiency Neg Hx   . Cancer Mother     Gall Bladder  . Heart disease Mother     before age 75  . Hyperlipidemia Mother   . Hypertension Mother   . Asthma Mother   . Cancer Sister     Cervical    Current Outpatient Prescriptions on File Prior to Visit  Medication Sig Dispense Refill  .  amLODipine (NORVASC) 5 MG tablet Take 1 tablet (5 mg total) by mouth daily. 30 tablet 6  . aspirin EC 81 MG tablet Take 81 mg by mouth daily.    Marland Kitchen atorvastatin (LIPITOR) 80 MG tablet TAKE 1 TABLET (80 MG TOTAL) BY MOUTH DAILY AT 6 PM. 90 tablet 3  . b complex-vitamin c-folic acid (NEPHRO-VITE) 0.8 MG TABS tablet Take 1 tablet by mouth daily.  11  . citalopram (CELEXA) 10 MG tablet TAKE 1 TABLET BY MOUTH EVERY DAY 30 tablet 2  . lidocaine-prilocaine (EMLA) cream Apply 1 application topically as needed.  6  . pantoprazole (PROTONIX) 40 MG tablet TAKE 1 TABLET (40 MG TOTAL) BY MOUTH DAILY. 30 tablet 11  . polyethylene glycol (MIRALAX / GLYCOLAX) packet Take 17 g by mouth daily. (Patient taking differently: Take 17 g by mouth daily as needed for mild constipation. ) 14 each 0  . VOLTAREN 1 % GEL Apply 4 g topically daily as needed (for knee pain). Bilateral Knee  1  . ZETIA 10 MG tablet TAKE 1 TABLET (10 MG TOTAL) BY MOUTH DAILY. 30 tablet 6  . [DISCONTINUED] metoprolol (TOPROL-XL) 50 MG 24 hr tablet Take 50 mg by mouth daily.      . [DISCONTINUED] niacin (NIASPAN) 500 MG CR tablet Take 500 mg by mouth at bedtime.      . [DISCONTINUED] omeprazole (PRILOSEC) 20 MG capsule 1 po every morning 30 capsule 5   No current facility-administered medications on file prior to visit.    No Known Allergies  Review of Systems: As listed above, otherwise negative.  Physical  Examination  Filed Vitals:   04/15/15 1235  BP: 163/76  Pulse: 68  Temp: 97.3 F (36.3 C)  TempSrc: Oral  Resp: 20  Height: 6\' 3"  (1.905 m)  Weight: 162 lb (73.483 kg)    General: A&O x 3, WDWN  Pulmonary: Sym exp, good air movt, CTAB, no rales, rhonchi, & wheezing  Cardiac: RRR, Nl S1, S2, no Murmurs, rubs or gallops  Gastrointestinal: soft, NTND, -G/R, - HSM, - masses, - CVAT B  Musculoskeletal: M/S 5/5 throughout , Extremities without ischemic changes, large pulsatile pseudoaneurysm in L upper arm as previously, now with some ooze at staple line  Laboratory See Trent is a 70 y.o. male who presents with: s/p L BC AVF PSA plication complicated with recurrent bleeding.   I discussed at this point that I don't think its safe to try to salvage this AVF, so I recommend: ligation of L BC AVF with Dr. [name]. The patient currently has a TDC in place, so he can complete HD via the [facility].  Once the patient recovers, will bring him back him for re-evaluation for new access.  The patient is aware of the risks and agrees to proceed.  Adele Barthel, MD [facility] [place] Office: [phone] Pager: [phone]  04/15/2015, 1:26 PM

## 2015-04-15 NOTE — Transfer of Care (Signed)
Immediate Anesthesia Transfer of Care Note  Patient: [name]  Procedure(s) Performed: Procedure(s): LIGATION OF BRACHIOCEPHALIC ARTERIOVENOUS  FISTULA (Left)  Patient Location: PACU  Anesthesia Type:MAC  Level of Consciousness: awake  Airway & Oxygen Therapy: Patient Spontanous Breathing and Patient connected to nasal cannula oxygen  Post-op Assessment: Report given to RN and Post -op Vital signs reviewed and stable  Post vital signs: Reviewed and stable  Last Vitals:  Filed Vitals:   04/15/15 1418 04/15/15 1647  BP: 198/92   Pulse: 52   Temp: 36.8 C 36.7 C  Resp: 18     Complications: No apparent anesthesia complications

## 2015-04-16 ENCOUNTER — Encounter (HOSPITAL_COMMUNITY): Payer: Self-pay | Admitting: Vascular Surgery

## 2015-04-17 ENCOUNTER — Encounter: Payer: Medicare Other | Admitting: Vascular Surgery

## 2015-04-24 ENCOUNTER — Encounter: Payer: Medicare Other | Admitting: Vascular Surgery

## 2015-04-30 ENCOUNTER — Encounter: Payer: Self-pay | Admitting: Family

## 2015-05-01 ENCOUNTER — Encounter: Payer: Medicare Other | Admitting: Vascular Surgery

## 2015-05-04 ENCOUNTER — Emergency Department (HOSPITAL_COMMUNITY): Payer: Medicare Other

## 2015-05-04 ENCOUNTER — Encounter (HOSPITAL_COMMUNITY): Payer: Self-pay | Admitting: Emergency Medicine

## 2015-05-04 ENCOUNTER — Emergency Department (HOSPITAL_COMMUNITY)
Admission: EM | Admit: 2015-05-04 | Discharge: 2015-05-04 | Disposition: A | Discharge status: Home / Self Care | Payer: Medicare Other | Attending: Emergency Medicine | Admitting: Emergency Medicine

## 2015-05-04 DIAGNOSIS — R55 Syncope and collapse: Secondary | ICD-10-CM | POA: Diagnosis present

## 2015-05-04 DIAGNOSIS — I2510 Atherosclerotic heart disease of native coronary artery without angina pectoris: Secondary | ICD-10-CM | POA: Insufficient documentation

## 2015-05-04 DIAGNOSIS — J449 Chronic obstructive pulmonary disease, unspecified: Secondary | ICD-10-CM | POA: Insufficient documentation

## 2015-05-04 DIAGNOSIS — I252 Old myocardial infarction: Secondary | ICD-10-CM | POA: Diagnosis not present

## 2015-05-04 DIAGNOSIS — Z79899 Other long term (current) drug therapy: Secondary | ICD-10-CM | POA: Diagnosis not present

## 2015-05-04 DIAGNOSIS — Z9889 Other specified postprocedural states: Secondary | ICD-10-CM | POA: Insufficient documentation

## 2015-05-04 DIAGNOSIS — F0390 Unspecified dementia without behavioral disturbance: Secondary | ICD-10-CM | POA: Diagnosis not present

## 2015-05-04 DIAGNOSIS — K219 Gastro-esophageal reflux disease without esophagitis: Secondary | ICD-10-CM | POA: Insufficient documentation

## 2015-05-04 DIAGNOSIS — M1990 Unspecified osteoarthritis, unspecified site: Secondary | ICD-10-CM | POA: Diagnosis not present

## 2015-05-04 DIAGNOSIS — R531 Weakness: Secondary | ICD-10-CM | POA: Insufficient documentation

## 2015-05-04 DIAGNOSIS — Z9861 Coronary angioplasty status: Secondary | ICD-10-CM | POA: Diagnosis not present

## 2015-05-04 DIAGNOSIS — R42 Dizziness and giddiness: Secondary | ICD-10-CM | POA: Insufficient documentation

## 2015-05-04 DIAGNOSIS — Z862 Personal history of diseases of the blood and blood-forming organs and certain disorders involving the immune mechanism: Secondary | ICD-10-CM | POA: Insufficient documentation

## 2015-05-04 DIAGNOSIS — Z9119 Patient's noncompliance with other medical treatment and regimen: Secondary | ICD-10-CM | POA: Diagnosis not present

## 2015-05-04 DIAGNOSIS — I5022 Chronic systolic (congestive) heart failure: Secondary | ICD-10-CM | POA: Diagnosis not present

## 2015-05-04 DIAGNOSIS — I129 Hypertensive chronic kidney disease with stage 1 through stage 4 chronic kidney disease, or unspecified chronic kidney disease: Secondary | ICD-10-CM | POA: Insufficient documentation

## 2015-05-04 DIAGNOSIS — F419 Anxiety disorder, unspecified: Secondary | ICD-10-CM | POA: Insufficient documentation

## 2015-05-04 DIAGNOSIS — I4891 Unspecified atrial fibrillation: Secondary | ICD-10-CM | POA: Diagnosis not present

## 2015-05-04 DIAGNOSIS — Z87891 Personal history of nicotine dependence: Secondary | ICD-10-CM | POA: Diagnosis not present

## 2015-05-04 DIAGNOSIS — E785 Hyperlipidemia, unspecified: Secondary | ICD-10-CM | POA: Insufficient documentation

## 2015-05-04 DIAGNOSIS — F329 Major depressive disorder, single episode, unspecified: Secondary | ICD-10-CM | POA: Diagnosis not present

## 2015-05-04 DIAGNOSIS — Z7982 Long term (current) use of aspirin: Secondary | ICD-10-CM | POA: Diagnosis not present

## 2015-05-04 DIAGNOSIS — N189 Chronic kidney disease, unspecified: Secondary | ICD-10-CM | POA: Diagnosis not present

## 2015-05-04 DIAGNOSIS — Z8673 Personal history of transient ischemic attack (TIA), and cerebral infarction without residual deficits: Secondary | ICD-10-CM | POA: Insufficient documentation

## 2015-05-04 LAB — BASIC METABOLIC PANEL
ANION GAP: 11 (ref 5–15)
BUN: 13 mg/dL (ref 6–20)
CHLORIDE: 100 mmol/L — AB (ref 101–111)
CO2: 28 mmol/L (ref 22–32)
Calcium: 9.5 mg/dL (ref 8.9–10.3)
Creatinine, Ser: 4.04 mg/dL — ABNORMAL HIGH (ref 0.61–1.24)
GFR calc Af Amer: 16 mL/min — ABNORMAL LOW (ref >60)
GFR calc non Af Amer: 14 mL/min — ABNORMAL LOW (ref >60)
GLUCOSE: 92 mg/dL (ref 65–99)
POTASSIUM: 4.6 mmol/L (ref 3.5–5.1)
Sodium: 139 mmol/L (ref 135–145)

## 2015-05-04 LAB — CBC
HEMATOCRIT: 37.3 % — AB (ref 39.0–52.0)
HEMOGLOBIN: 12.6 g/dL — AB (ref 13.0–17.0)
MCH: 33.3 pg (ref 26.0–34.0)
MCHC: 33.8 g/dL (ref 30.0–36.0)
MCV: 98.7 fL (ref 78.0–100.0)
Platelets: 189 10*3/uL (ref 150–400)
RBC: 3.78 MIL/uL — ABNORMAL LOW (ref 4.22–5.81)
RDW: 13.9 % (ref 11.5–15.5)
WBC: 7.9 10*3/uL (ref 4.0–10.5)

## 2015-05-04 LAB — I-STAT TROPONIN, ED: Troponin i, poc: 0.01 ng/mL (ref 0.00–0.08)

## 2015-05-04 NOTE — ED Provider Notes (Signed)
CSN: [id number]     Arrival date & time 05/04/15  1954 History  By signing my name below, I, Jolayne Panther, attest that this documentation has been prepared under the direction and in the presence of Milton Ferguson, MD. Electronically Signed: Jolayne Panther, Scribe. 05/04/2015. 8:56 PM.   Chief Complaint  Patient presents with  . Loss of Consciousness   Patient is a 70 y.o. male presenting with syncope. The history is provided by the patient and the spouse (pt complains of dizziness and weakness).  Loss of Consciousness Episode history:  Multiple Most recent episode:  Today Timing:  Constant Progression:  Unchanged Chronicity:  New Context comment:  Post dialysis  Witnessed: yes   Relieved by:  Nothing Worsened by:  Nothing tried Ineffective treatments:  None tried Associated symptoms: dizziness and weakness   Associated symptoms: no chest pain, no headaches and no seizures   Risk factors comment:  Hx of stroke   HPI Comments: ZEREK MCCAMISH is a 70 y.o. male with a hx of frequent strokes, who presents to the Emergency Department complaining of sudden onset of mild dizziness today following dialysis this morning, which is not uncommon but was worse than normal today. Pt's wife also reports that earlier this evening he was very slow to respond to her, and she notes that when she went to check on him he was conscious but was slumped over in his recliner and was complaining of difficulty moving. Pt has not been able to ambulate on his own since. He normally ambulates with a walker. Pt's wife also notes that about one year ago he suffered from a brain hemorrhage. He has not eaten since breakfast this morning. Pt denies generalized pain. He has not taken any anticoagulants today other than at dialysis, but he does normally take 80 mg aspiring daily.  Past Medical History  Diagnosis Date  . Hypertension   . COPD (chronic obstructive pulmonary disease) ([place])   . Hyperlipidemia    . Cardiomyopathy     EF of 30% per echo in June of 2012; admitted with congestive heart failure; nonobstructive CAD on cath in 08/2010  . History of noncompliance with medical treatment   . Cerebrovascular disease 2011    CVA  in 02/2010-only deficit is decreased vision in  right eye  . Alcohol abuse     wife states he was never heavy drinker  . Tobacco abuse     40 pack years  . Abdominal aortic aneurysm ([place])     Stent graft repair  . Chronic systolic CHF (congestive heart failure) ([place])   . Leg pain   . CKD (chronic kidney disease) stage 3, GFR 30-59 ml/min     creatinine-1.7 in 08/2010  . CAD (coronary artery disease)   . Myocardial infarction acute ([place]) 10/01/11  . CKD (chronic kidney disease), stage III 12/06/2012  . Effusion of right knee joint 12/06/2012  . Anemia   . Atrial fibrillation ([place])   . Ataxic gait   . Weakness generalized   . Frequent falls   . Forgetfulness   . Stroke [facility]) 2011    decreased vision of right eye    Most recent 05/23/14 - bleed - has a drag to his left leg  . Depression   . Anxiety     Takes Citalopram  . Arthritis     knees  . Renal insufficiency   . Shortness of breath dyspnea     sometimes after dialysis  . Dementia  early onset  . Enlarged prostate   . GERD (gastroesophageal reflux disease)    Past Surgical History  Procedure Laterality Date  . Colonoscopy w/ polypectomy  2006    Dr. Tamala Julian: anal fissure, sessile adenomatous polyp, diverticulosis  . Esophagogastroduodenoscopy  11/15/2010    Procedure: ESOPHAGOGASTRODUODENOSCOPY (EGD);  Surgeon: Dorothyann Peng, MD;  Location: [facility];  Service: Endoscopy;  Laterality: N/A;  with propofol sedation; procedure start @ 0813  . Flexible sigmoidoscopy  11/15/2010    Procedure: FLEXIBLE SIGMOIDOSCOPY;  Surgeon: Dorothyann Peng, MD;  Location: [facility];  Service: Endoscopy;  Laterality: N/A;  with propofol sedation; ended @ 0808  . Polypectomy  12/13/2010    Procedure: POLYPECTOMY;  Surgeon:  Dorothyann Peng, MD;  Location: [facility];  Service: Endoscopy;  Laterality: N/A;  right colon, cecal, transverse, and sigmoid polypectomy  . Cardiac catheterization  10/04/11    Normal left main, 95% pLAD stenosis with ulceration s/p successful BMS placement, 30% segmental plaque beyond this, dLAD after D2 with 50% plaquing, then focal 70% lesion, 80% focal short D2 stenosis, 30% pOM2 lesion, 30-40% mOM3 stenosis, Shephard's crook region of RCA with 50% lesion, mRCA 50-60% lesion and mild dRCA irregularities.   . Coronary stent placement  10/04/11  . Abdominal aortic aneurysm repair w/ endoluminal graft      01/16/2008  . Left heart catheterization with coronary angiogram N/A 10/04/2011    Procedure: LEFT HEART CATHETERIZATION WITH CORONARY ANGIOGRAM;  Surgeon: Hillary Bow, MD;  Location: [facility] CATH LAB;  Service: Cardiovascular;  Laterality: N/A;  . Av fistula placement Left 07/07/2014    Procedure: Creation of Left Arm ARTERIOVENOUS Fistula;  Surgeon: Rosetta Posner, MD;  Location: [place];  Service: Vascular;  Laterality: Left;  . Insertion of dialysis catheter Right 08/05/2014    Procedure: INSERTION OF DIALYSIS CATHETER Right internal Jugular;  Surgeon: Conrad Sherwood, MD;  Location: [place];  Service: Vascular;  Laterality: Right;  . Port-a-cath removal    . Peripheral vascular catheterization Left 01/15/2015    Procedure: Fistulagram;  Surgeon: Conrad [place]. John, MD;  Location: [place] CV LAB;  Service: Cardiovascular;  Laterality: Left;  arm  . Peripheral vascular catheterization Left 01/15/2015    Procedure: Peripheral Vascular Intervention;  Surgeon: Conrad [place], MD;  Location: [place] CV LAB;  Service: Cardiovascular;  Laterality: Left;  ARM VEINOUS PTA  . Insertion of dialysis catheter Left 02/13/2015    Procedure: INSERTION OF DIALYSIS CATHETER AND CENTRAL VENOGRAM;  Surgeon: Serafina Mitchell, MD;  Location: [place];  Service: Vascular;  Laterality: Left;  . Peripheral vascular catheterization  N/A 04/07/2015    Procedure: A/V Shuntogram/Fistulagram;  Surgeon: Conrad Wasola, MD;  Location: [place] CV LAB;  Service: Cardiovascular;  Laterality: N/A;  . Revison of arteriovenous fistula Left [id number]    Procedure: PLICATION OF LEFT ARM PSEUDOANEURYSM;  Surgeon: Conrad [place] L[place], MD;  Location: [place];  Service: Vascular;  Laterality: Left;  . Patch angioplasty Left 04/09/2015    Procedure: PATCH ANGIOPLASTY;  Surgeon: Conrad [place], MD;  Location: [place];  Service: Vascular;  Laterality: Left;  . Ligation of arteriovenous  fistula Left 04/15/2015    Procedure: LIGATION OF BRACHIOCEPHALIC ARTERIOVENOUS  FISTULA;  Surgeon: Mal Misty, MD;  Location[facility];  Service: Vascular;  Laterality: Left;   Family History  Problem Relation Age of Onset  . Colon cancer Neg Hx   . Anesthesia problems Neg Hx   . Hypotension  Neg Hx   . Malignant hyperthermia Neg Hx   . Pseudochol deficiency Neg Hx   . Cancer Mother     Gall Bladder  . Heart disease Mother     before age 64  . Hyperlipidemia Mother   . Hypertension Mother   . Asthma Mother   . Cancer Sister     Cervical   Social History  Substance Use Topics  . Smoking status: Former Smoker -- 0.50 packs/day for 50 years    Types: Cigarettes    Start date: 06/25/1957    Quit date: 03/04/2013  . Smokeless tobacco: Never Used  . Alcohol Use: No     Comment: None for several years    Review of Systems  Constitutional: Negative for appetite change and fatigue.  HENT: Negative for congestion, ear discharge and sinus pressure.   Eyes: Negative for discharge.  Respiratory: Negative for cough.   Cardiovascular: Positive for syncope. Negative for chest pain.  Gastrointestinal: Negative for abdominal pain and diarrhea.  Genitourinary: Negative for frequency and hematuria.  Musculoskeletal: Negative for back pain.  Skin: Negative for rash.  Neurological: Positive for dizziness and weakness. Negative for seizures and headaches.   Psychiatric/Behavioral: Negative for hallucinations.   Allergies  Review of patient's allergies indicates no known allergies.  Home Medications   Prior to Admission medications   Medication Sig Start Date End Date Taking? Authorizing Provider  amLODipine (NORVASC) 5 MG tablet Take 1 tablet (5 mg total) by mouth daily. 04/14/15  Yes Herminio Commons, MD  atorvastatin (LIPITOR) 80 MG tablet TAKE 1 TABLET (80 MG TOTAL) BY MOUTH DAILY AT 6 PM. 06/30/14  Yes Herminio Commons, MD  b complex-vitamin c-folic acid (NEPHRO-VITE) 0.8 MG TABS tablet Take 1 tablet by mouth daily. 12/15/14  Yes Historical Provider, MD  citalopram (CELEXA) 10 MG tablet TAKE 1 TABLET BY MOUTH EVERY DAY 03/19/15  Yes Kathyrn Drown, MD  pantoprazole (PROTONIX) 40 MG tablet TAKE 1 TABLET (40 MG TOTAL) BY MOUTH DAILY. 03/06/15  Yes Mahala Menghini, PA-C  ZETIA 10 MG tablet TAKE 1 TABLET (10 MG TOTAL) BY MOUTH DAILY. 11/17/14  Yes Herminio Commons, MD  aspirin EC 81 MG tablet Take 81 mg by mouth daily.    Historical Provider, MD  lidocaine-prilocaine (EMLA) cream Apply 1 application topically as needed. 12/04/14   Historical Provider, MD  naproxen sodium (ANAPROX) 220 MG tablet Take 440 mg by mouth daily as needed (for pain).    Historical Provider, MD  oxyCODONE-acetaminophen (ROXICET) 5-325 MG tablet Take 1 tablet by mouth every 6 (six) hours as needed. 04/15/15   Alvia Grove, PA-C  polyethylene glycol (MIRALAX / GLYCOLAX) packet Take 17 g by mouth daily. Patient taking differently: Take 17 g by mouth daily as needed for mild constipation.  11/29/13   Kathie Dike, MD  VOLTAREN 1 % GEL Apply 4 g topically daily as needed (for knee pain). Bilateral Knee 12/07/13   Historical Provider, MD   BP 159/70 mmHg  Pulse 75  Temp(Src) 100.4 F (38 C) (Oral)  Resp 24  Ht 6\' 3"  (1.905 m)  Wt 161 lb 13.1 oz (73.4 kg)  BMI 20.23 kg/m2  SpO2 97% Physical Exam  Constitutional: He is oriented to person, place, and time. He  appears well-developed.  HENT:  Head: Normocephalic.  Eyes: Conjunctivae and EOM are normal. No scleral icterus.  Neck: Neck supple. No thyromegaly present.  Cardiovascular: Normal rate and regular rhythm.  Exam reveals no gallop  and no friction rub.   No murmur heard. Pulmonary/Chest: No stridor. He has no wheezes. He has no rales. He exhibits no tenderness.  Abdominal: He exhibits no distension. There is no tenderness. There is no rebound.  Musculoskeletal: Normal range of motion. He exhibits no edema.  Lymphadenopathy:    He has no cervical adenopathy.  Neurological: He is oriented to person, place, and time. He exhibits normal muscle tone. Coordination normal.  Slow to respond but answered questions appropriately  General weakness with ambulation   Skin: No rash noted. No erythema.  Psychiatric: He has a normal mood and affect. His behavior is normal.    ED Course  Procedures  DIAGNOSTIC STUDIES:    Oxygen Saturation is 97% on RA, adequate by my interpretation.   COORDINATION OF CARE:  8:45 PM Will review labs and imaging. Discussed treatment plan with pt at bedside and pt agreed to plan.    Labs Review Labs Reviewed  BASIC METABOLIC PANEL - Abnormal; Notable for the following:    Chloride 100 (*)    Creatinine, Ser 4.04 (*)    GFR calc non Af Amer 14 (*)    GFR calc Af Amer 16 (*)    All other components within normal limits  CBC - Abnormal; Notable for the following:    RBC 3.78 (*)    Hemoglobin 12.6 (*)    HCT 37.3 (*)    All other components within normal limits  URINALYSIS, ROUTINE W REFLEX MICROSCOPIC (NOT AT [facility])  I-STAT TROPOININ, ED   Imaging Review No results found. I have personally reviewed and evaluated these images and lab results as part of my medical decision-making.   EKG Interpretation None      MDM   Final diagnoses:  None    Patient is a dialysis patient that after dialysis today was lethargic and weak. Patient feeling back to  normal now lab and CT were unremarkable for any acute findings. Patient will follow-up with his PCP as needed  The chart was scribed for me under my direct supervision.  I personally performed the history, physical, and medical decision making and all procedures in the evaluation of this patient.Milton Ferguson, MD 05/04/15 [phone]

## 2015-05-04 NOTE — ED Notes (Signed)
Patient verbalizes understanding of discharge instructions, home care and follow up care. Patient out of department at this time with family.g

## 2015-05-04 NOTE — Discharge Instructions (Signed)
Follow up with your md if any problems °

## 2015-05-04 NOTE — ED Notes (Signed)
Per pt's wife after dialysis pt has not been feeling well. Pt's wife states he had syncopal episode, and gait is weak.

## 2015-05-06 ENCOUNTER — Encounter: Payer: Self-pay | Admitting: Vascular Surgery

## 2015-05-06 ENCOUNTER — Encounter: Payer: Medicare Other | Admitting: Vascular Surgery

## 2015-05-06 ENCOUNTER — Ambulatory Visit (INDEPENDENT_AMBULATORY_CARE_PROVIDER_SITE_OTHER): Payer: Medicare Other | Admitting: Vascular Surgery

## 2015-05-06 VITALS — BP 173/93 | HR 57 | Temp 97.1°F | Resp 16 | Ht 75.0 in | Wt 162.0 lb

## 2015-05-06 DIAGNOSIS — N186 End stage renal disease: Secondary | ICD-10-CM

## 2015-05-06 NOTE — Progress Notes (Signed)
    Postoperative Access Visit   History of Present Illness  [name] is a 70 y.o. year old male who presents for postoperative follow-up for: ligation of L BC AVF (Date: 04/15/15).  The patient's wounds are healed.  The patient notes no steal symptoms.  The patient is able to complete their activities of daily living.  The patient's current symptoms are: none.  For VQI Use Only  PRE-ADM LIVING: Home  AMB STATUS: Ambulatory  Physical Examination Filed Vitals:   05/06/15 0905 05/06/15 0908  BP: 179/91 173/93  Pulse: 59 57  Temp: 97.1 F (36.2 C)   Resp: 16     LUE: Incision is healed, skin feels warm, hand grip is 5/5, sensation in digits is intact, palpable thrill, bruit can be auscultated   Medical Decision Making  [name] is a 70 y.o. year old male who presents s/p L BC AVF ligation, s/p failed plication of L BC AVF PSA.   Pt is going to pursue peritoneal dialysis.  We discussed considering backup AVF placement.  The patient would like to have peritoneal catheter placement first before considering AVF placement.  Will have him follow up at that point.  Thank you for allowing [place] to participate in this patient's care.  Adele Barthel, MD [facility] Office: [phone] Pager: [phone]  05/06/2015, 9:25 AM

## 2015-05-13 ENCOUNTER — Encounter (HOSPITAL_COMMUNITY): Payer: Self-pay | Admitting: *Deleted

## 2015-05-13 ENCOUNTER — Other Ambulatory Visit: Payer: Self-pay

## 2015-05-14 ENCOUNTER — Encounter (HOSPITAL_COMMUNITY)
Admission: RE | Disposition: A | Discharge status: Home / Self Care | Payer: Self-pay | Source: Ambulatory Visit | Attending: Vascular Surgery

## 2015-05-14 ENCOUNTER — Ambulatory Visit (HOSPITAL_COMMUNITY)
Admission: RE | Admit: 2015-05-14 | Discharge: 2015-05-14 | Disposition: A | Discharge status: Home / Self Care | Payer: Medicare Other | Source: Ambulatory Visit | Attending: Vascular Surgery | Admitting: Vascular Surgery

## 2015-05-14 ENCOUNTER — Ambulatory Visit (HOSPITAL_COMMUNITY): Payer: Medicare Other | Admitting: Anesthesiology

## 2015-05-14 ENCOUNTER — Ambulatory Visit (HOSPITAL_COMMUNITY): Payer: Medicare Other

## 2015-05-14 ENCOUNTER — Encounter (HOSPITAL_COMMUNITY): Payer: Self-pay | Admitting: *Deleted

## 2015-05-14 DIAGNOSIS — Z992 Dependence on renal dialysis: Secondary | ICD-10-CM | POA: Diagnosis not present

## 2015-05-14 DIAGNOSIS — I2510 Atherosclerotic heart disease of native coronary artery without angina pectoris: Secondary | ICD-10-CM | POA: Diagnosis not present

## 2015-05-14 DIAGNOSIS — Z8673 Personal history of transient ischemic attack (TIA), and cerebral infarction without residual deficits: Secondary | ICD-10-CM | POA: Insufficient documentation

## 2015-05-14 DIAGNOSIS — I252 Old myocardial infarction: Secondary | ICD-10-CM | POA: Diagnosis not present

## 2015-05-14 DIAGNOSIS — Z7982 Long term (current) use of aspirin: Secondary | ICD-10-CM | POA: Insufficient documentation

## 2015-05-14 DIAGNOSIS — J449 Chronic obstructive pulmonary disease, unspecified: Secondary | ICD-10-CM | POA: Diagnosis not present

## 2015-05-14 DIAGNOSIS — N186 End stage renal disease: Secondary | ICD-10-CM | POA: Insufficient documentation

## 2015-05-14 DIAGNOSIS — Z95828 Presence of other vascular implants and grafts: Secondary | ICD-10-CM

## 2015-05-14 DIAGNOSIS — Z87891 Personal history of nicotine dependence: Secondary | ICD-10-CM | POA: Insufficient documentation

## 2015-05-14 DIAGNOSIS — I429 Cardiomyopathy, unspecified: Secondary | ICD-10-CM | POA: Diagnosis not present

## 2015-05-14 DIAGNOSIS — I5022 Chronic systolic (congestive) heart failure: Secondary | ICD-10-CM | POA: Diagnosis not present

## 2015-05-14 DIAGNOSIS — E785 Hyperlipidemia, unspecified: Secondary | ICD-10-CM | POA: Insufficient documentation

## 2015-05-14 DIAGNOSIS — F0390 Unspecified dementia without behavioral disturbance: Secondary | ICD-10-CM | POA: Insufficient documentation

## 2015-05-14 DIAGNOSIS — Z419 Encounter for procedure for purposes other than remedying health state, unspecified: Secondary | ICD-10-CM

## 2015-05-14 DIAGNOSIS — I120 Hypertensive chronic kidney disease with stage 5 chronic kidney disease or end stage renal disease: Secondary | ICD-10-CM | POA: Insufficient documentation

## 2015-05-14 DIAGNOSIS — N185 Chronic kidney disease, stage 5: Secondary | ICD-10-CM | POA: Diagnosis not present

## 2015-05-14 HISTORY — PX: EXCHANGE OF A DIALYSIS CATHETER: SHX5818

## 2015-05-14 HISTORY — DX: Constipation, unspecified: K59.00

## 2015-05-14 LAB — POCT I-STAT 4, (NA,K, GLUC, HGB,HCT)
GLUCOSE: 72 mg/dL (ref 65–99)
HCT: 44 % (ref 39.0–52.0)
Hemoglobin: 15 g/dL (ref 13.0–17.0)
Potassium: 5 mmol/L (ref 3.5–5.1)
SODIUM: 140 mmol/L (ref 135–145)

## 2015-05-14 SURGERY — EXCHANGE OF A DIALYSIS CATHETER
Anesthesia: Monitor Anesthesia Care | Site: Neck | Laterality: Left

## 2015-05-14 MED ORDER — LIDOCAINE HCL (PF) 1 % IJ SOLN
INTRAMUSCULAR | Status: AC
  Filled 2015-05-14: qty 30

## 2015-05-14 MED ORDER — HEPARIN SODIUM (PORCINE) 1000 UNIT/ML IJ SOLN
INTRAMUSCULAR | Status: AC
  Filled 2015-05-14: qty 1

## 2015-05-14 MED ORDER — MIDAZOLAM HCL 5 MG/5ML IJ SOLN
INTRAMUSCULAR | Status: DC | PRN
  Administered 2015-05-14: 2 mg via INTRAVENOUS

## 2015-05-14 MED ORDER — SODIUM CHLORIDE 0.9 % IV SOLN
INTRAVENOUS | Status: DC | PRN
  Administered 2015-05-14: 500 mL

## 2015-05-14 MED ORDER — DEXTROSE 5 % IV SOLN
1.5000 g | INTRAVENOUS | Status: AC
  Administered 2015-05-14: 1.5 g via INTRAVENOUS
  Filled 2015-05-14: qty 1.5

## 2015-05-14 MED ORDER — HEPARIN SODIUM (PORCINE) 1000 UNIT/ML IJ SOLN
INTRAMUSCULAR | Status: DC | PRN
  Administered 2015-05-14: 3.9 mL

## 2015-05-14 MED ORDER — SODIUM CHLORIDE 0.9 % IV SOLN
INTRAVENOUS | Status: DC
  Administered 2015-05-14: 11:00:00 via INTRAVENOUS

## 2015-05-14 MED ORDER — MIDAZOLAM HCL 2 MG/2ML IJ SOLN
INTRAMUSCULAR | Status: AC
  Filled 2015-05-14: qty 2

## 2015-05-14 MED ORDER — FENTANYL CITRATE (PF) 100 MCG/2ML IJ SOLN: 25.0000 ug | INTRAMUSCULAR | Status: DC | PRN

## 2015-05-14 MED ORDER — FENTANYL CITRATE (PF) 100 MCG/2ML IJ SOLN
INTRAMUSCULAR | Status: DC | PRN
  Administered 2015-05-14 (×2): 50 ug via INTRAVENOUS
  Administered 2015-05-14: 100 ug via INTRAVENOUS
  Administered 2015-05-14: 50 ug via INTRAVENOUS

## 2015-05-14 MED ORDER — CHLORHEXIDINE GLUCONATE CLOTH 2 % EX PADS: 6.0000 | MEDICATED_PAD | Freq: Once | CUTANEOUS | Status: DC

## 2015-05-14 MED ORDER — LIDOCAINE HCL (PF) 1 % IJ SOLN
INTRAMUSCULAR | Status: DC | PRN
  Administered 2015-05-14: 25 mL

## 2015-05-14 MED ORDER — SODIUM CHLORIDE 0.9 % IV SOLN
INTRAVENOUS | Status: DC | PRN
  Administered 2015-05-14: 15:00:00 via INTRAVENOUS

## 2015-05-14 MED ORDER — FENTANYL CITRATE (PF) 250 MCG/5ML IJ SOLN
INTRAMUSCULAR | Status: AC
  Filled 2015-05-14: qty 5

## 2015-05-14 SURGICAL SUPPLY — 51 items
BAG DECANTER FOR FLEXI CONT (MISCELLANEOUS) ×3 IMPLANT
BIOPATCH RED 1 DISK 7.0 (GAUZE/BANDAGES/DRESSINGS) ×2 IMPLANT
BIOPATCH RED 1IN DISK 7.0MM (GAUZE/BANDAGES/DRESSINGS) ×1
CATH PALINDROME RT-P 15FX19CM (CATHETERS) IMPLANT
CATH PALINDROME RT-P 15FX23CM (CATHETERS) IMPLANT
CATH PALINDROME RT-P 15FX28CM (CATHETERS) ×2 IMPLANT
CATH PALINDROME RT-P 15FX55CM (CATHETERS) IMPLANT
CHLORAPREP W/TINT 26ML (MISCELLANEOUS) ×3 IMPLANT
COVER PROBE W GEL 5X96 (DRAPES) IMPLANT
DRAPE C-ARM 42X72 X-RAY (DRAPES) ×3 IMPLANT
DRAPE CHEST BREAST 15X10 FENES (DRAPES) ×3 IMPLANT
ELECT CAUTERY BLADE 6.4 (BLADE) ×3 IMPLANT
ELECT REM PT RETURN 9FT ADLT (ELECTROSURGICAL) ×3
ELECTRODE REM PT RTRN 9FT ADLT (ELECTROSURGICAL) ×1 IMPLANT
GAUZE SPONGE 2X2 8PLY STRL LF (GAUZE/BANDAGES/DRESSINGS) IMPLANT
GAUZE SPONGE 4X4 16PLY XRAY LF (GAUZE/BANDAGES/DRESSINGS) ×3 IMPLANT
GLOVE BIO SURGEON STRL SZ7 (GLOVE) ×2 IMPLANT
GLOVE BIOGEL PI IND STRL 6.5 (GLOVE) IMPLANT
GLOVE BIOGEL PI IND STRL 7.5 (GLOVE) ×1 IMPLANT
GLOVE BIOGEL PI INDICATOR 6.5 (GLOVE) ×4
GLOVE BIOGEL PI INDICATOR 7.5 (GLOVE) ×2
GLOVE SURG SS PI 7.0 STRL IVOR (GLOVE) ×6 IMPLANT
GOWN SPEC L4 XLG W/TWL (GOWN DISPOSABLE) ×2 IMPLANT
GOWN STRL REUS W/ TWL LRG LVL3 (GOWN DISPOSABLE) ×3 IMPLANT
GOWN STRL REUS W/TWL LRG LVL3 (GOWN DISPOSABLE) ×9
KIT BASIN OR (CUSTOM PROCEDURE TRAY) ×3 IMPLANT
KIT ROOM TURNOVER OR (KITS) ×3 IMPLANT
LIQUID BAND (GAUZE/BANDAGES/DRESSINGS) ×3 IMPLANT
NDL 18GX1X1/2 (RX/OR ONLY) (NEEDLE) ×1 IMPLANT
NEEDLE 18GX1X1/2 (RX/OR ONLY) (NEEDLE) ×3 IMPLANT
NEEDLE 22X1 1/2 (OR ONLY) (NEEDLE) ×3 IMPLANT
NEEDLE HYPO 25GX1X1/2 BEV (NEEDLE) ×3 IMPLANT
NS IRRIG 1000ML POUR BTL (IV SOLUTION) ×3 IMPLANT
PACK SURGICAL SETUP 50X90 (CUSTOM PROCEDURE TRAY) ×3 IMPLANT
PAD ARMBOARD 7.5X6 YLW CONV (MISCELLANEOUS) ×6 IMPLANT
PENCIL BUTTON HOLSTER BLD 10FT (ELECTRODE) ×2 IMPLANT
SPONGE GAUZE 2X2 STER 10/PKG (GAUZE/BANDAGES/DRESSINGS)
SUCTION FRAZIER TIP 10 FR DISP (SUCTIONS) ×2 IMPLANT
SUT ETHILON 3 0 PS 1 (SUTURE) ×3 IMPLANT
SUT VICRYL 4-0 PS2 18IN ABS (SUTURE) ×3 IMPLANT
SYR 20CC LL (SYRINGE) ×6 IMPLANT
SYR 30ML LL (SYRINGE) IMPLANT
SYR 5ML LL (SYRINGE) ×6 IMPLANT
SYR CONTROL 10ML LL (SYRINGE) ×3 IMPLANT
SYRINGE 10CC LL (SYRINGE) ×3 IMPLANT
TAPE CLOTH SURG 4X10 WHT LF (GAUZE/BANDAGES/DRESSINGS) ×2 IMPLANT
TUBE CONNECTING 20'X1/4 (TUBING) ×1
TUBE CONNECTING 20X1/4 (TUBING) ×2 IMPLANT
WATER STERILE IRR 1000ML POUR (IV SOLUTION) ×3 IMPLANT
WIRE AMPLATZ SS-J .035X180CM (WIRE) ×2 IMPLANT
YANKAUER SUCT BULB TIP NO VENT (SUCTIONS) ×2 IMPLANT

## 2015-05-14 NOTE — Anesthesia Procedure Notes (Signed)
Procedure Name: MAC Date/Time: 05/14/2015 3:27 PM Performed by: Jacquiline Doe A Pre-anesthesia Checklist: Patient identified, Timeout performed, Emergency Drugs available, Suction available and Patient being monitored Patient Re-evaluated:Patient Re-evaluated prior to inductionOxygen Delivery Method: Nasal cannula Preoxygenation: Pre-oxygenation with 100% oxygen Intubation Type: IV induction Placement Confirmation: positive ETCO2 Dental Injury: Teeth and Oropharynx as per pre-operative assessment

## 2015-05-14 NOTE — Op Note (Signed)
OPERATIVE NOTE  PROCEDURE: 1. left internal jugular vein tunneled dialysis catheter exchange  PRE-OPERATIVE DIAGNOSIS: end-stage renal failure  POST-OPERATIVE DIAGNOSIS: same as above  SURGEON: Adele Barthel, MD  ANESTHESIA: local and MAC  ESTIMATED BLOOD LOSS: 30 cc  FINDING(S): 1.  Tips of the catheter in the right atrium on fluoroscopy 2.  No obvious pneumothorax on fluoroscopy  SPECIMEN(S):  none  INDICATIONS:   [name] is a 70 y.o. male who presents with end stage renal disease.  The patient presents for tunneled dialysis catheter placement.  The patient is aware the risks of tunneled dialysis catheter placement include but are not limited to: bleeding, infection, central venous injury, pneumothorax, possible venous stenosis, possible malpositioning in the venous system, and possible infections related to long-term catheter presence.  The patient was aware of these risks and agreed to proceed.  DESCRIPTION: After written full informed consent was obtained from the patient, the patient was taken back to the operating room.  Prior to induction, the patient was given IV antibiotics.  After obtaining adequate sedation, the patient was prepped and draped in the standard fashion for a chest or neck tunneled dialysis catheter placement.  I injected 5 cc of 1% lidocaine with epinephrine around the subcutaneous tract of this prior tunneled dialysis catheter to obtain local anesthesia.  I then dissected out the cuff bluntly to free it up.  The securing sutures were transected.  I then made a a stab incision over the catheter near its prior neck cannulation site.  I dissected out the catheter and clamped it.  I then transected the catheter and removed the back end of this old tunneled dialysis catheter.  The residual catheter was secured with a clamp and one lumen was cannulated with an Amplatz which was advanced into the inferior vena cava under fluroscopic guidance.  The residual  portion of the old tunneled dialysis catheter was exchanged for a dilator-sheath.  A new 28 cm Palindrome catheter was woven onto the wire and advanced down into the right atrium under fluoroscopic guidance.  The wire was removed.  The dilator sheath was peeled away leaving the catheter into place.    I anesthesized the tract for the subcutaneous tunnel with a total of 15 cc of 1% Lidocaine with epinepherine.  I then made stab incisions at the exit site.   I dissected from the exit site to the cannulation site with a metal tunneler.   The back end of this catheter was transected, revealing the two lumens of this catheter.  One port was docked on the metal tunnerler and used to retrograde deliver the catheter through the subcutaneous tunnel.  A second cut was made in the catheter to free up the back end of the catheter from the tunneler.  The ports were docked onto these two lumens.  The catheter hub was then clipped into place.  Each port was tested by aspirating and flushing.  No resistance was noted.  Each port was then thoroughly flushed with heparinized saline.  The catheter was secured in placed with two interrupted stitches of 3-0 Nylon tied to the catheter.  The neck incision was closed with two U-stitches of 4-0 Monocryl.  The neck and chest incision were cleaned and sterile bandages applied.  Each port was then loaded with concentrated heparin (1000 Units/mL) at the manufacturer recommended volumes to each port.  Sterile caps were applied to each port.    On completion fluoroscopy, the tips of the catheter  were in the right atrium, and there was no evidence of pneumothorax.   COMPLICATIONS: none  CONDITION: stable   Adele Barthel, MD [facility] Office: [phone] Pager: [phone]  05/14/2015, 4:13 PM

## 2015-05-14 NOTE — H&P (Signed)
Brief History and Physical  History of Present Illness  [name] is a 70 y.o. male who presents with chief complaint: poor flow rates via LIJV TDC.  The patient presents today for Left IJV exchange.    Past Medical History  Diagnosis Date  . Hypertension   . COPD (chronic obstructive pulmonary disease) ([place])   . Hyperlipidemia   . Cardiomyopathy     EF of 30% per echo in June of 2012; admitted with congestive heart failure; nonobstructive CAD on cath in 08/2010  . History of noncompliance with medical treatment   . Cerebrovascular disease 2011    CVA  in 02/2010-only deficit is decreased vision in  right eye  . Alcohol abuse     wife states he was never heavy drinker  . Tobacco abuse     40 pack years  . Abdominal aortic aneurysm ([place])     Stent graft repair  . Chronic systolic CHF (congestive heart failure) ([place])   . Leg pain   . CKD (chronic kidney disease) stage 3, GFR 30-59 ml/min     creatinine-1.7 in 08/2010  . CAD (coronary artery disease)   . Myocardial infarction acute ([place]) 10/01/11  . CKD (chronic kidney disease), stage III 12/06/2012  . Effusion of right knee joint 12/06/2012  . Anemia   . Atrial fibrillation ([place])   . Ataxic gait   . Weakness generalized   . Frequent falls   . Forgetfulness   . Stroke [facility]) 2011    decreased vision of right eye    Most recent 05/23/14 - bleed - has a drag to his left leg  . Depression   . Anxiety     Takes Citalopram  . Arthritis     knees  . Renal insufficiency   . Shortness of breath dyspnea     sometimes after dialysis  . Dementia     early onset  . Enlarged prostate   . GERD (gastroesophageal reflux disease)   . Constipation     Past Surgical History  Procedure Laterality Date  . Colonoscopy w/ polypectomy  2006    Dr. Tamala Julian: anal fissure, sessile adenomatous polyp, diverticulosis  . Esophagogastroduodenoscopy  11/15/2010    Procedure: ESOPHAGOGASTRODUODENOSCOPY (EGD);  Surgeon: Dorothyann Peng, MD;   Location: [facility];  Service: Endoscopy;  Laterality: N/A;  with propofol sedation; procedure start @ 0813  . Flexible sigmoidoscopy  11/15/2010    Procedure: FLEXIBLE SIGMOIDOSCOPY;  Surgeon: Dorothyann Peng, MD;  Location: [facility];  Service: Endoscopy;  Laterality: N/A;  with propofol sedation; ended @ 0808  . Polypectomy  12/13/2010    Procedure: POLYPECTOMY;  Surgeon: Dorothyann Peng, MD;  Location: [facility];  Service: Endoscopy;  Laterality: N/A;  right colon, cecal, transverse, and sigmoid polypectomy  . Cardiac catheterization  10/04/11    Normal left main, 95% pLAD stenosis with ulceration s/p successful BMS placement, 30% segmental plaque beyond this, dLAD after D2 with 50% plaquing, then focal 70% lesion, 80% focal short D2 stenosis, 30% pOM2 lesion, 30-40% mOM3 stenosis, Shephard's crook region of RCA with 50% lesion, mRCA 50-60% lesion and mild dRCA irregularities.   . Coronary stent placement  10/04/11  . Abdominal aortic aneurysm repair w/ endoluminal graft      01/16/2008  . Left heart catheterization with coronary angiogram N/A 10/04/2011    Procedure: LEFT HEART CATHETERIZATION WITH CORONARY ANGIOGRAM;  Surgeon: Hillary Bow, MD;  Location: [facility] CATH LAB;  Service: Cardiovascular;  Laterality: N/A;  . Av fistula placement Left 07/07/2014    Procedure: Creation of Left Arm ARTERIOVENOUS Fistula;  Surgeon: Rosetta Posner, MD;  Location: [place];  Service: Vascular;  Laterality: Left;  . Insertion of dialysis catheter Right 08/05/2014    Procedure: INSERTION OF DIALYSIS CATHETER Right internal Jugular;  Surgeon: Conrad Mesick, MD;  Location: [place];  Service: Vascular;  Laterality: Right;  . Port-a-cath removal    . Peripheral vascular catheterization Left 01/15/2015    Procedure: Fistulagram;  Surgeon: Conrad Ludlow, MD;  Location: [place] CV LAB;  Service: Cardiovascular;  Laterality: Left;  arm  . Peripheral vascular catheterization Left 01/15/2015    Procedure: Peripheral Vascular  Intervention;  Surgeon: Conrad Freeport, MD;  Location: [place] CV [facility];  Service: Cardiovascular;  Laterality: Left;  ARM VEINOUS PTA  . Insertion of dialysis catheter Left 02/13/2015    Procedure: INSERTION OF DIALYSIS CATHETER AND CENTRAL VENOGRAM;  Surgeon: Serafina Mitchell, MD;  Location: [place];  Service: Vascular;  Laterality: Left;  . Peripheral vascular catheterization N/A 04/07/2015    Procedure: A/V Shuntogram/Fistulagram;  Surgeon: Conrad Ozawkie, MD;  Location: [place] CV LAB;  Service: Cardiovascular;  Laterality: N/A;  . Revison of arteriovenous fistula Left [id number]    Procedure: PLICATION OF LEFT ARM PSEUDOANEURYSM;  Surgeon: Conrad [place], MD;  Location: [place];  Service: Vascular;  Laterality: Left;  . Patch angioplasty Left 04/09/2015    Procedure: PATCH ANGIOPLASTY;  Surgeon: Conrad Sibley, MD;  Location: [place];  Service: Vascular;  Laterality: Left;  . Ligation of arteriovenous  fistula Left 04/15/2015    Procedure: LIGATION OF BRACHIOCEPHALIC ARTERIOVENOUS  FISTULA;  Surgeon: Mal Misty, MD;  Location: [place];  Service: Vascular;  Laterality: Left;    Social History   Social History  . Marital Status: Married    Spouse Name: N/A  . Number of Children: N/A  . Years of Education: N/A   Occupational History  . retired     [employer]   Social History Main Topics  . Smoking status: Former Smoker -- 0.50 packs/day for 50 years    Types: Cigarettes    Start date: 06/25/1957    Quit date: 03/04/2013  . Smokeless tobacco: Never Used  . Alcohol Use: No     Comment: None for several years  . Drug Use: No  . Sexual Activity: No   Other Topics Concern  . Not on file   Social History Narrative   Married, retired from [employer], 6 children   Right handed   Caffeine use - none    Family History  Problem Relation Age of Onset  . Colon cancer Neg Hx   . Anesthesia problems Neg Hx   . Hypotension Neg Hx   . Malignant hyperthermia Neg Hx   . Pseudochol  deficiency Neg Hx   . Cancer Mother     Gall Bladder  . Heart disease Mother     before age 32  . Hyperlipidemia Mother   . Hypertension Mother   . Asthma Mother   . Cancer Sister     Cervical    No current facility-administered medications on file prior to encounter.   Current Outpatient Prescriptions on File Prior to Encounter  Medication Sig Dispense Refill  . amLODipine (NORVASC) 5 MG tablet Take 1 tablet (5 mg total) by mouth daily. (Patient taking differently: Take 5 mg by mouth every evening. ) 30 tablet 6  .  aspirin EC 81 MG tablet Take 81 mg by mouth every evening.     Marland Kitchen atorvastatin (LIPITOR) 80 MG tablet TAKE 1 TABLET (80 MG TOTAL) BY MOUTH DAILY AT 6 PM. 90 tablet 3  . b complex-vitamin c-folic acid (NEPHRO-VITE) 0.8 MG TABS tablet Take 1 tablet by mouth daily.  11  . citalopram (CELEXA) 10 MG tablet TAKE 1 TABLET BY MOUTH EVERY DAY (Patient taking differently: TAKE 1 TABLET BY MOUTH EVERY evening) 30 tablet 2  . pantoprazole (PROTONIX) 40 MG tablet TAKE 1 TABLET (40 MG TOTAL) BY MOUTH DAILY. (Patient taking differently: TAKE 1 TABLET (40 MG TOTAL) BY MOUTH DAILY. - every evening) 30 tablet 11  . polyethylene glycol (MIRALAX / GLYCOLAX) packet Take 17 g by mouth daily. 14 each 0  . [DISCONTINUED] metoprolol (TOPROL-XL) 50 MG 24 hr tablet Take 50 mg by mouth daily.      . [DISCONTINUED] niacin (NIASPAN) 500 MG CR tablet Take 500 mg by mouth at bedtime.      . [DISCONTINUED] omeprazole (PRILOSEC) 20 MG capsule 1 po every morning 30 capsule 5    No Known Allergies  Review of Systems: As listed above, otherwise negative.  Physical Examination  Filed Vitals:   05/14/15 1001  BP: 183/81  Pulse: 54  Temp: 99 F (37.2 C)  TempSrc: Oral  Resp: 18  Height: 6\' 3"  (1.905 m)  Weight: 162 lb (73.483 kg)  SpO2: 100%    General: A&O x 3, WDWN  Pulmonary: Sym exp, good air movt, CTAB, no rales, rhonchi, & wheezing, LIJV TDC  Cardiac: RRR, Nl S1, S2, no Murmurs, rubs or  gallops  Gastrointestinal: soft, NTND, -G/R, - HSM, - masses, - CVAT B  Musculoskeletal: M/S 5/5 throughout , Extremities without ischemic changes , healed left arm incisions  Laboratory: CBC:    Component Value Date/Time   WBC 7.9 05/04/2015 2040   WBC 6.4 12/29/2014 1026   RBC 3.78* 05/04/2015 2040   RBC 3.17* 12/29/2014 1026   HGB 15.0 05/14/2015 1022   HCT 44.0 05/14/2015 1022   HCT 28.9* 12/29/2014 1026   PLT 189 05/04/2015 2040   PLT 256 12/29/2014 1026   MCV 98.7 05/04/2015 2040   MCV 91 12/29/2014 1026   MCH 33.3 05/04/2015 2040   MCH 31.9 12/29/2014 1026   MCHC 33.8 05/04/2015 2040   MCHC 34.9 12/29/2014 1026   RDW 13.9 05/04/2015 2040   RDW 14.0 12/29/2014 1026   LYMPHSABS 0.8 02/09/2015 1825   LYMPHSABS 2.0 12/29/2014 1026   MONOABS 0.6 02/09/2015 1825   EOSABS 0.1 02/09/2015 1825   EOSABS 0.2 12/29/2014 1026   BASOSABS 0.0 02/09/2015 1825   BASOSABS 0.0 12/29/2014 1026    BMP:    Component Value Date/Time   NA 140 05/14/2015 1022   NA 142 12/18/2014 1054   K 5.0 05/14/2015 1022   CL 100* 05/04/2015 2040   CO2 28 05/04/2015 2040   GLUCOSE 72 05/14/2015 1022   GLUCOSE 96 12/18/2014 1054   BUN 13 05/04/2015 2040   BUN 26 12/18/2014 1054   CREATININE 4.04* 05/04/2015 2040   CREATININE 4.79* 05/19/2014 1109   CALCIUM 9.5 05/04/2015 2040   GFRNONAA 14* 05/04/2015 2040   GFRAA 16* 05/04/2015 2040    Coagulation: Lab Results  Component Value Date   INR 1.17 02/14/2015   INR 1.18 05/24/2014   INR 1.11 11/26/2013   No results found for: PTT  Lipids:    Component Value Date/Time   CHOL 147  03/25/2015 1008   CHOL 191 01/20/2014 1147   TRIG 65 03/25/2015 1008   HDL 52 03/25/2015 1008   HDL 52 01/20/2014 1147   CHOLHDL 2.8 03/25/2015 1008   CHOLHDL 3.7 01/20/2014 1147   VLDL 13 01/20/2014 1147   LDLCALC 82 03/25/2015 1008   LDLCALC 126* 01/20/2014 1147      Medical Decision Making  [name] is a 70 y.o. male who presents with:  ESRD, poorly functioning LIJV TDC.   The patient is scheduled for: LIJV TDC exchange The patient is aware the risks of tunneled dialysis catheter placement include but are not limited to: bleeding, infection, central venous injury, pneumothorax, possible venous stenosis, possible malpositioning in the venous system, and possible infections related to long-term catheter presence.   The patient is aware of the risks and agrees to proceed.  Adele Barthel, MD [facility] Office: [phone] Pager: [phone]  05/14/2015, 2:25 PM

## 2015-05-14 NOTE — Transfer of Care (Signed)
Immediate Anesthesia Transfer of Care Note  Patient: [name]  Procedure(s) Performed: Procedure(s): EXCHANGE OF A DIALYSIS CATHETER-LEFT INTERNAL JUGULAR VEIN (Left)  Patient Location: PACU  Anesthesia Type:MAC  Level of Consciousness: awake, oriented, sedated, patient cooperative and responds to stimulation  Airway & Oxygen Therapy: Patient Spontanous Breathing  Post-op Assessment: Report given to RN, Post -op Vital signs reviewed and stable, Patient moving all extremities and Patient moving all extremities X 4  Post vital signs: Reviewed and stable  Last Vitals:  Filed Vitals:   05/14/15 1001  BP: 183/81  Pulse: 54  Temp: 37.2 C  Resp: 18    Complications: No apparent anesthesia complications

## 2015-05-14 NOTE — Anesthesia Postprocedure Evaluation (Signed)
Anesthesia Post Note  Patient: ELISHUA KLEMPNER  Procedure(s) Performed: Procedure(s) (LRB): EXCHANGE OF A DIALYSIS CATHETER-LEFT INTERNAL JUGULAR VEIN (Left)  Patient location during evaluation: PACU Anesthesia Type: MAC Level of consciousness: awake and alert Pain management: pain level controlled Vital Signs Assessment: post-procedure vital signs reviewed and stable Respiratory status: spontaneous breathing, nonlabored ventilation and respiratory function stable Cardiovascular status: stable and blood pressure returned to baseline Anesthetic complications: no    Last Vitals:  Filed Vitals:   05/14/15 1645 05/14/15 1655  BP: 176/97 169/88  Pulse: 57 57  Temp:  36.7 C  Resp: 9 14    Last Pain:  Filed Vitals:   05/14/15 1658  PainSc: 0-No pain                 Callie Bunyard,W. EDMOND

## 2015-05-14 NOTE — Anesthesia Preprocedure Evaluation (Signed)
Anesthesia Evaluation  Patient identified by MRN, date of birth, ID band Patient awake    Reviewed: Allergy & Precautions, H&P , NPO status , Patient's Chart, lab work & pertinent test results  Airway Mallampati: II  TM Distance: >3 FB Neck ROM: Full    Dental no notable dental hx. (+) Edentulous Upper, Edentulous Lower, Dental Advisory Given   Pulmonary COPD, former smoker,    Pulmonary exam normal breath sounds clear to auscultation       Cardiovascular hypertension, Pt. on medications + CAD, + Past MI, + Peripheral Vascular Disease and +CHF   Rhythm:Regular Rate:Normal     Neuro/Psych Anxiety Depression CVA, Residual Symptoms    GI/Hepatic Neg liver ROS, GERD  Medicated and Controlled,  Endo/Other  negative endocrine ROS  Renal/GU ESRF and DialysisRenal disease  negative genitourinary   Musculoskeletal  (+) Arthritis , Osteoarthritis,    Abdominal   Peds  Hematology negative hematology ROS (+)   Anesthesia Other Findings   Reproductive/Obstetrics negative OB ROS                             Anesthesia Physical Anesthesia Plan  ASA: III  Anesthesia Plan: MAC   Post-op Pain Management:    Induction: Intravenous  Airway Management Planned: Simple Face Mask  Additional Equipment:   Intra-op Plan:   Post-operative Plan:   Informed Consent: I have reviewed the patients History and Physical, chart, labs and discussed the procedure including the risks, benefits and alternatives for the proposed anesthesia with the patient or authorized representative who has indicated his/her understanding and acceptance.   Dental advisory given  Plan Discussed with: CRNA  Anesthesia Plan Comments:         Anesthesia Quick Evaluation

## 2015-05-15 ENCOUNTER — Encounter (HOSPITAL_COMMUNITY): Payer: Self-pay | Admitting: Vascular Surgery

## 2015-05-25 ENCOUNTER — Encounter (HOSPITAL_COMMUNITY): Payer: Self-pay

## 2015-05-25 ENCOUNTER — Telehealth: Payer: Self-pay | Admitting: *Deleted

## 2015-05-25 ENCOUNTER — Emergency Department (HOSPITAL_COMMUNITY)
Admission: EM | Admit: 2015-05-25 | Discharge: 2015-05-25 | Disposition: A | Discharge status: Home / Self Care | Payer: Medicare Other | Attending: Emergency Medicine | Admitting: Emergency Medicine

## 2015-05-25 ENCOUNTER — Encounter: Payer: Self-pay | Admitting: Family

## 2015-05-25 DIAGNOSIS — T148XXA Other injury of unspecified body region, initial encounter: Secondary | ICD-10-CM

## 2015-05-25 DIAGNOSIS — Z992 Dependence on renal dialysis: Secondary | ICD-10-CM | POA: Diagnosis not present

## 2015-05-25 DIAGNOSIS — Z959 Presence of cardiac and vascular implant and graft, unspecified: Secondary | ICD-10-CM | POA: Diagnosis not present

## 2015-05-25 DIAGNOSIS — M7981 Nontraumatic hematoma of soft tissue: Secondary | ICD-10-CM | POA: Insufficient documentation

## 2015-05-25 DIAGNOSIS — N183 Chronic kidney disease, stage 3 (moderate): Secondary | ICD-10-CM | POA: Insufficient documentation

## 2015-05-25 DIAGNOSIS — Z87891 Personal history of nicotine dependence: Secondary | ICD-10-CM | POA: Insufficient documentation

## 2015-05-25 DIAGNOSIS — Y712 Prosthetic and other implants, materials and accessory cardiovascular devices associated with adverse incidents: Secondary | ICD-10-CM | POA: Insufficient documentation

## 2015-05-25 DIAGNOSIS — I5022 Chronic systolic (congestive) heart failure: Secondary | ICD-10-CM | POA: Insufficient documentation

## 2015-05-25 DIAGNOSIS — Z79899 Other long term (current) drug therapy: Secondary | ICD-10-CM | POA: Diagnosis not present

## 2015-05-25 DIAGNOSIS — T82838A Hemorrhage of vascular prosthetic devices, implants and grafts, initial encounter: Secondary | ICD-10-CM | POA: Diagnosis present

## 2015-05-25 DIAGNOSIS — E785 Hyperlipidemia, unspecified: Secondary | ICD-10-CM | POA: Diagnosis not present

## 2015-05-25 DIAGNOSIS — I4891 Unspecified atrial fibrillation: Secondary | ICD-10-CM | POA: Insufficient documentation

## 2015-05-25 DIAGNOSIS — J449 Chronic obstructive pulmonary disease, unspecified: Secondary | ICD-10-CM | POA: Diagnosis not present

## 2015-05-25 DIAGNOSIS — I130 Hypertensive heart and chronic kidney disease with heart failure and stage 1 through stage 4 chronic kidney disease, or unspecified chronic kidney disease: Secondary | ICD-10-CM | POA: Insufficient documentation

## 2015-05-25 LAB — CBC WITH DIFFERENTIAL/PLATELET
BASOS ABS: 0.1 10*3/uL (ref 0.0–0.1)
Basophils Relative: 1 %
Eosinophils Absolute: 0.2 10*3/uL (ref 0.0–0.7)
Eosinophils Relative: 4 %
HEMATOCRIT: 32.6 % — AB (ref 39.0–52.0)
HEMOGLOBIN: 10.8 g/dL — AB (ref 13.0–17.0)
LYMPHS PCT: 25 %
Lymphs Abs: 1.4 10*3/uL (ref 0.7–4.0)
MCH: 32 pg (ref 26.0–34.0)
MCHC: 33.1 g/dL (ref 30.0–36.0)
MCV: 96.4 fL (ref 78.0–100.0)
Monocytes Absolute: 0.4 10*3/uL (ref 0.1–1.0)
Monocytes Relative: 7 %
NEUTROS ABS: 3.5 10*3/uL (ref 1.7–7.7)
NEUTROS PCT: 63 %
Platelets: 134 10*3/uL — ABNORMAL LOW (ref 150–400)
RBC: 3.38 MIL/uL — AB (ref 4.22–5.81)
RDW: 13.6 % (ref 11.5–15.5)
WBC: 5.5 10*3/uL (ref 4.0–10.5)

## 2015-05-25 LAB — BASIC METABOLIC PANEL
ANION GAP: 10 (ref 5–15)
BUN: 39 mg/dL — ABNORMAL HIGH (ref 6–20)
CO2: 28 mmol/L (ref 22–32)
Calcium: 9.5 mg/dL (ref 8.9–10.3)
Chloride: 102 mmol/L (ref 101–111)
Creatinine, Ser: 6.99 mg/dL — ABNORMAL HIGH (ref 0.61–1.24)
GFR calc Af Amer: 8 mL/min — ABNORMAL LOW (ref >60)
GFR, EST NON AFRICAN AMERICAN: 7 mL/min — AB (ref >60)
Glucose, Bld: 86 mg/dL (ref 65–99)
POTASSIUM: 4.4 mmol/L (ref 3.5–5.1)
Sodium: 140 mmol/L (ref 135–145)

## 2015-05-25 LAB — PROTIME-INR
INR: 1.15 (ref 0.00–1.49)
PROTHROMBIN TIME: 14.9 s (ref 11.6–15.2)

## 2015-05-25 LAB — APTT: APTT: 33 s (ref 24–37)

## 2015-05-25 MED ORDER — THROMBIN 5000 UNITS EX SOLR
Freq: Once | CUTANEOUS | Status: AC
  Administered 2015-05-25: 5000 [IU] via TOPICAL
  Filled 2015-05-25: qty 5000

## 2015-05-25 MED ORDER — CEPHALEXIN 250 MG PO CAPS: 250.0000 mg | ORAL_CAPSULE | Freq: Every day | ORAL | Status: DC

## 2015-05-25 NOTE — ED Notes (Signed)
Per EMS, pt was at dialysis and about 30 min into infusing pt began bleeding from an old dialysis shunt.

## 2015-05-25 NOTE — Telephone Encounter (Signed)
Received call from Dr. Oneida Alar; He spoke to [place] doctor. Patient needs appt with Dr. [name] this Thursday 05-28-15 for evaluation hematoma vs pseudoaneurysm at [facility] site.

## 2015-05-25 NOTE — ED Provider Notes (Signed)
CSN: [id number]     Arrival date & time 05/25/15  1252 History   First MD Initiated Contact with Patient 05/25/15 1302     Chief Complaint  Patient presents with  . Bleeding/Bruising   HPI The patient has a history of multiple medical problems that includes chronic renal failure. Patient went to dialysis today. He is currently receiving his dialysis through a catheter in the left subclavian region. Patient has a scar from an older vascular catheter that was placed in the superior clavicular region, most likely internal jugular.  During dialysis today, the patient started having bleeding from the old vascular site. Patient denies any bleeding elsewhere. He is not having any difficulty with nausea, vomiting, chest pain or shortness of breath. Past Medical History  Diagnosis Date  . Hypertension   . COPD (chronic obstructive pulmonary disease) ([place])   . Hyperlipidemia   . Cardiomyopathy     EF of 30% per echo in June of 2012; admitted with congestive heart failure; nonobstructive CAD on cath in 08/2010  . History of noncompliance with medical treatment   . Cerebrovascular disease 2011    CVA  in 02/2010-only deficit is decreased vision in  right eye  . Alcohol abuse     wife states he was never heavy drinker  . Tobacco abuse     40 pack years  . Abdominal aortic aneurysm ([place])     Stent graft repair  . Chronic systolic CHF (congestive heart failure) ([place])   . Leg pain   . CKD (chronic kidney disease) stage 3, GFR 30-59 ml/min     creatinine-1.7 in 08/2010  . CAD (coronary artery disease)   . Myocardial infarction acute ([place]) 10/01/11  . CKD (chronic kidney disease), stage III 12/06/2012  . Effusion of right knee joint 12/06/2012  . Anemia   . Atrial fibrillation ([place])   . Ataxic gait   . Weakness generalized   . Frequent falls   . Forgetfulness   . Stroke [facility]) 2011    decreased vision of right eye    Most recent 05/23/14 - bleed - has a drag to his left leg  . Depression   . Anxiety       Takes Citalopram  . Arthritis     knees  . Renal insufficiency   . Shortness of breath dyspnea     sometimes after dialysis  . Dementia     early onset  . Enlarged prostate   . GERD (gastroesophageal reflux disease)   . Constipation    Past Surgical History  Procedure Laterality Date  . Colonoscopy w/ polypectomy  2006    Dr. Tamala Julian: anal fissure, sessile adenomatous polyp, diverticulosis  . Esophagogastroduodenoscopy  11/15/2010    Procedure: ESOPHAGOGASTRODUODENOSCOPY (EGD);  Surgeon: Dorothyann Peng, MD;  [facility]: [facility];  Service: Endoscopy;  Laterality: N/A;  with propofol sedation; procedure start @ 0813  . Flexible sigmoidoscopy  11/15/2010    Procedure: FLEXIBLE SIGMOIDOSCOPY;  Surgeon: Dorothyann Peng, MD;  Location: [facility];  Service: Endoscopy;  Laterality: N/A;  with propofol sedation; ended @ 0808  . Polypectomy  12/13/2010    Procedure: POLYPECTOMY;  Surgeon: Dorothyann Peng, MD;  [facility]: [facility];  Service: Endoscopy;  Laterality: N/A;  right colon, cecal, transverse, and sigmoid polypectomy  . Cardiac catheterization  10/04/11    Normal left main, 95% pLAD stenosis with ulceration s/p successful BMS placement, 30% segmental plaque beyond this, dLAD after D2 with 50% plaquing, then focal 70%  lesion, 80% focal short D2 stenosis, 30% pOM2 lesion, 30-40% mOM3 stenosis, Shephard's crook region of RCA with 50% lesion, mRCA 50-60% lesion and mild dRCA irregularities.   . Coronary stent placement  10/04/11  . Abdominal aortic aneurysm repair w/ endoluminal graft      01/16/2008  . Left heart catheterization with coronary angiogram N/A 10/04/2011    Procedure: LEFT HEART CATHETERIZATION WITH CORONARY ANGIOGRAM;  Surgeon: Hillary Bow, MD;  Location: [facility] CATH LAB;  Service: Cardiovascular;  Laterality: N/A;  . Av fistula placement Left 07/07/2014    Procedure: Creation of Left Arm ARTERIOVENOUS Fistula;  Surgeon: Rosetta Posner, MD;  Location: [place];  Service: Vascular;  Laterality:  Left;  . Insertion of dialysis catheter Right 08/05/2014    Procedure: INSERTION OF DIALYSIS CATHETER Right internal Jugular;  Surgeon: Conrad [place], MD;  Location: [place];  Service: Vascular;  Laterality: Right;  . Port-a-cath removal    . Peripheral vascular catheterization Left 01/15/2015    Procedure: Fistulagram;  Surgeon: Conrad Kenwood, MD;  Location: [place] CV LAB;  Service: Cardiovascular;  Laterality: Left;  arm  . Peripheral vascular catheterization Left 01/15/2015    Procedure: Peripheral Vascular Intervention;  Surgeon: Conrad [place], MD;  Location: [place] CV LAB;  Service: Cardiovascular;  Laterality: Left;  ARM VEINOUS PTA  . Insertion of dialysis catheter Left 02/13/2015    Procedure: INSERTION OF DIALYSIS CATHETER AND CENTRAL VENOGRAM;  Surgeon: Serafina Mitchell, MD;  Location: [place];  Service: Vascular;  Laterality: Left;  . Peripheral vascular catheterization N/A 04/07/2015    Procedure: A/V Shuntogram/Fistulagram;  Surgeon: Conrad Clemmons, MD;  Location: [place] CV LAB;  Service: Cardiovascular;  Laterality: N/A;  . Revison of arteriovenous fistula Left [id number]    Procedure: PLICATION OF LEFT ARM PSEUDOANEURYSM;  Surgeon: Conrad Aline, MD;  Location: [place];  Service: Vascular;  Laterality: Left;  . Patch angioplasty Left 04/09/2015    Procedure: PATCH ANGIOPLASTY;  Surgeon: Conrad [place], MD;  Location: [place];  Service: Vascular;  Laterality: Left;  . Ligation of arteriovenous  fistula Left 04/15/2015    Procedure: LIGATION OF BRACHIOCEPHALIC ARTERIOVENOUS  FISTULA;  Surgeon: Mal Misty, MD;  Location: [place];  Service: Vascular;  Laterality: Left;  . Exchange of a dialysis catheter Left 05/14/2015    Procedure: EXCHANGE OF A DIALYSIS CATHETER-LEFT INTERNAL JUGULAR VEIN;  Surgeon: Conrad [place], MD;  Location: [facility] OR;  Service: Vascular;  Laterality: Left;   Family History  Problem Relation Age of Onset  . Colon cancer Neg Hx   . Anesthesia problems Neg Hx   .  Hypotension Neg Hx   . Malignant hyperthermia Neg Hx   . Pseudochol deficiency Neg Hx   . Cancer Mother     Gall Bladder  . Heart disease Mother     before age 61  . Hyperlipidemia Mother   . Hypertension Mother   . Asthma Mother   . Cancer Sister     Cervical   Social History  Substance Use Topics  . Smoking status: Former Smoker -- 0.50 packs/day for 50 years    Types: Cigarettes    Start date: 06/25/1957    Quit date: 03/04/2013  . Smokeless tobacco: Never Used  . Alcohol Use: No     Comment: None for several years    Review of Systems  All other systems reviewed and are negative.     Allergies  Review of patient's  allergies indicates no known allergies.  Home Medications   Prior to Admission medications   Medication Sig Start Date End Date Taking? Authorizing Provider  aspirin EC 81 MG tablet Take 81 mg by mouth every evening.    Yes Historical Provider, MD  atorvastatin (LIPITOR) 80 MG tablet TAKE 1 TABLET (80 MG TOTAL) BY MOUTH DAILY AT 6 PM. 06/30/14  Yes Herminio Commons, MD  b complex-vitamin c-folic acid (NEPHRO-VITE) 0.8 MG TABS tablet Take 1 tablet by mouth daily. 12/15/14  Yes Historical Provider, MD  citalopram (CELEXA) 10 MG tablet TAKE 1 TABLET BY MOUTH EVERY DAY Patient taking differently: TAKE 1 TABLET BY MOUTH EVERY evening 03/19/15  Yes Kathyrn Drown, MD  ezetimibe (ZETIA) 10 MG tablet Take 1 tablet by mouth daily. 05/12/15  Yes Historical Provider, MD  naproxen sodium (ANAPROX) 220 MG tablet Take 440 mg by mouth daily as needed (pain).   Yes Historical Provider, MD  pantoprazole (PROTONIX) 40 MG tablet TAKE 1 TABLET (40 MG TOTAL) BY MOUTH DAILY. Patient taking differently: TAKE 1 TABLET (40 MG TOTAL) BY MOUTH DAILY. - every evening 03/06/15  Yes Mahala Menghini, PA-C  polyethylene glycol (MIRALAX / GLYCOLAX) packet Take 17 g by mouth daily. 11/29/13  Yes Kathie Dike, MD  amLODipine (NORVASC) 5 MG tablet Take 1 tablet (5 mg total) by mouth  daily. Patient taking differently: Take 5 mg by mouth every evening.  04/14/15   Herminio Commons, MD   BP 168/85 mmHg  Pulse 57  Temp(Src) 98.2 F (36.8 C) (Oral)  Resp 17  Ht 6\' 3"  (1.905 m)  Wt 72.576 kg  BMI 20.00 kg/m2  SpO2 100% Physical Exam  Constitutional: No distress.  HENT:  Head: Normocephalic and atraumatic.  Right Ear: External ear normal.  Left Ear: External ear normal.  Eyes: Conjunctivae are normal. Right eye exhibits no discharge. Left eye exhibits no discharge. No scleral icterus.  Neck: Neck supple. No tracheal deviation present.  Vascular cather in the left subclavian region, no active bleeding; area of hypertrophic scar and granulation tissue in the supraclavicular region, area of skin ulceration approximately 1/3 cm.  Blood oozing from this site.  No pulsatile flow or mass  Cardiovascular: Normal rate, regular rhythm and intact distal pulses.   Pulmonary/Chest: Effort normal and breath sounds normal. No stridor. No respiratory distress. He has no wheezes. He has no rales.  Abdominal: Soft. Bowel sounds are normal. He exhibits no distension. There is no tenderness. There is no rebound and no guarding.  Musculoskeletal: He exhibits no edema or tenderness.  Neurological: He is alert. He has normal strength. No cranial nerve deficit (no facial droop, extraocular movements intact, no slurred speech) or sensory deficit. He exhibits normal muscle tone. He displays no seizure activity. Coordination normal.  Skin: Skin is warm and dry. No rash noted.  Psychiatric: He has a normal mood and affect.  Nursing note and vitals reviewed.     ED Course  Procedures (including critical care time) Labs Review Labs Reviewed  CBC WITH DIFFERENTIAL/PLATELET - Abnormal; Notable for the following:    RBC 3.38 (*)    Hemoglobin 10.8 (*)    HCT 32.6 (*)    Platelets 134 (*)    All other components within normal limits  BASIC METABOLIC PANEL - Abnormal; Notable for the  following:    BUN 39 (*)    Creatinine, Ser 6.99 (*)    GFR calc non Af Amer 7 (*)    GFR  calc Af Amer 8 (*)    All other components within normal limits  PROTIME-INR  APTT      MDM   Final diagnoses:  Hematoma    Pressure was applied to the area of bleeding. Thrombin was also applied to the wound and a dressing was then applied. There has not been any further bleeding. The family arrived and mentioned that this protuberant area on the skin is actually new over the last couple of days.  I'm concerned that he may have developed possible pseudoaneurysm although lesion is not pulsatile.  I'll consult the vascular surgeon considering his recent procedure regarding further evaluation and treatment.  Discussed with Dr Oneida Alar.  He suspects the patient has a hematoma at the old site.  Not likely to be pseudoaneurysm.  They will arrange to see him in the office in the next couple of days.    Dorie Rank, MD 05/28/15 [phone]

## 2015-05-26 ENCOUNTER — Encounter: Payer: Self-pay | Admitting: Vascular Surgery

## 2015-05-26 NOTE — Telephone Encounter (Signed)
Spoke with [name] to schedule, dpm

## 2015-05-27 ENCOUNTER — Encounter (HOSPITAL_COMMUNITY): Payer: Self-pay | Admitting: Emergency Medicine

## 2015-05-27 ENCOUNTER — Emergency Department (HOSPITAL_COMMUNITY)
Admission: EM | Admit: 2015-05-27 | Discharge: 2015-05-27 | Disposition: A | Discharge status: Home / Self Care | Payer: Medicare Other | Attending: Emergency Medicine | Admitting: Emergency Medicine

## 2015-05-27 DIAGNOSIS — I2510 Atherosclerotic heart disease of native coronary artery without angina pectoris: Secondary | ICD-10-CM | POA: Insufficient documentation

## 2015-05-27 DIAGNOSIS — Z79899 Other long term (current) drug therapy: Secondary | ICD-10-CM | POA: Insufficient documentation

## 2015-05-27 DIAGNOSIS — E785 Hyperlipidemia, unspecified: Secondary | ICD-10-CM | POA: Insufficient documentation

## 2015-05-27 DIAGNOSIS — I4891 Unspecified atrial fibrillation: Secondary | ICD-10-CM | POA: Insufficient documentation

## 2015-05-27 DIAGNOSIS — I5022 Chronic systolic (congestive) heart failure: Secondary | ICD-10-CM | POA: Insufficient documentation

## 2015-05-27 DIAGNOSIS — Z87891 Personal history of nicotine dependence: Secondary | ICD-10-CM | POA: Diagnosis not present

## 2015-05-27 DIAGNOSIS — I129 Hypertensive chronic kidney disease with stage 1 through stage 4 chronic kidney disease, or unspecified chronic kidney disease: Secondary | ICD-10-CM | POA: Insufficient documentation

## 2015-05-27 DIAGNOSIS — I429 Cardiomyopathy, unspecified: Secondary | ICD-10-CM | POA: Insufficient documentation

## 2015-05-27 DIAGNOSIS — Y732 Prosthetic and other implants, materials and accessory gastroenterology and urology devices associated with adverse incidents: Secondary | ICD-10-CM | POA: Insufficient documentation

## 2015-05-27 DIAGNOSIS — R58 Hemorrhage, not elsewhere classified: Secondary | ICD-10-CM

## 2015-05-27 DIAGNOSIS — F329 Major depressive disorder, single episode, unspecified: Secondary | ICD-10-CM | POA: Diagnosis not present

## 2015-05-27 DIAGNOSIS — J449 Chronic obstructive pulmonary disease, unspecified: Secondary | ICD-10-CM | POA: Insufficient documentation

## 2015-05-27 DIAGNOSIS — N183 Chronic kidney disease, stage 3 (moderate): Secondary | ICD-10-CM | POA: Insufficient documentation

## 2015-05-27 DIAGNOSIS — Z8673 Personal history of transient ischemic attack (TIA), and cerebral infarction without residual deficits: Secondary | ICD-10-CM | POA: Insufficient documentation

## 2015-05-27 DIAGNOSIS — I110 Hypertensive heart disease with heart failure: Secondary | ICD-10-CM | POA: Insufficient documentation

## 2015-05-27 DIAGNOSIS — T8243XA Leakage of vascular dialysis catheter, initial encounter: Secondary | ICD-10-CM | POA: Diagnosis present

## 2015-05-27 LAB — CBC WITH DIFFERENTIAL/PLATELET
BASOS ABS: 0 10*3/uL (ref 0.0–0.1)
Basophils Relative: 1 %
EOS PCT: 4 %
Eosinophils Absolute: 0.3 10*3/uL (ref 0.0–0.7)
HCT: 29.9 % — ABNORMAL LOW (ref 39.0–52.0)
Hemoglobin: 10.1 g/dL — ABNORMAL LOW (ref 13.0–17.0)
LYMPHS ABS: 1.5 10*3/uL (ref 0.7–4.0)
LYMPHS PCT: 27 %
MCH: 32.8 pg (ref 26.0–34.0)
MCHC: 33.8 g/dL (ref 30.0–36.0)
MCV: 97.1 fL (ref 78.0–100.0)
MONO ABS: 0.4 10*3/uL (ref 0.1–1.0)
MONOS PCT: 7 %
NEUTROS ABS: 3.4 10*3/uL (ref 1.7–7.7)
Neutrophils Relative %: 61 %
PLATELETS: 109 10*3/uL — AB (ref 150–400)
RBC: 3.08 MIL/uL — ABNORMAL LOW (ref 4.22–5.81)
RDW: 13.7 % (ref 11.5–15.5)
WBC: 5.6 10*3/uL (ref 4.0–10.5)

## 2015-05-27 LAB — COMPREHENSIVE METABOLIC PANEL
ALT: 17 U/L (ref 17–63)
ANION GAP: 10 (ref 5–15)
AST: 24 U/L (ref 15–41)
Albumin: 3.9 g/dL (ref 3.5–5.0)
Alkaline Phosphatase: 71 U/L (ref 38–126)
BUN: 55 mg/dL — ABNORMAL HIGH (ref 6–20)
CHLORIDE: 106 mmol/L (ref 101–111)
CO2: 25 mmol/L (ref 22–32)
Calcium: 9.4 mg/dL (ref 8.9–10.3)
Creatinine, Ser: 8.76 mg/dL — ABNORMAL HIGH (ref 0.61–1.24)
GFR, EST AFRICAN AMERICAN: 6 mL/min — AB (ref >60)
GFR, EST NON AFRICAN AMERICAN: 5 mL/min — AB (ref >60)
Glucose, Bld: 89 mg/dL (ref 65–99)
POTASSIUM: 5 mmol/L (ref 3.5–5.1)
Sodium: 141 mmol/L (ref 135–145)
TOTAL PROTEIN: 7.6 g/dL (ref 6.5–8.1)
Total Bilirubin: 1 mg/dL (ref 0.3–1.2)

## 2015-05-27 LAB — PROTIME-INR
INR: 1.23 (ref 0.00–1.49)
PROTHROMBIN TIME: 15.7 s — AB (ref 11.6–15.2)

## 2015-05-27 MED ORDER — THROMBIN 5000 UNITS EX SOLR
Freq: Once | CUTANEOUS | Status: AC
  Administered 2015-05-27: 5000 [IU] via TOPICAL
  Filled 2015-05-27: qty 5000

## 2015-05-27 MED ORDER — "THROMBI-PAD 3""X3"" EX PADS"
MEDICATED_PAD | CUTANEOUS | Status: AC
  Filled 2015-05-27: qty 1

## 2015-05-27 NOTE — ED Provider Notes (Signed)
CSN: [id number]     Arrival date & time 05/27/15  1205 History   First MD Initiated Contact with Patient 05/27/15 1225     Chief Complaint  Patient presents with  . Vascular Access Problem     (Consider location/radiation/quality/duration/timing/severity/associated sxs/prior Treatment) HPI   70 yo M w/ ESRD with intermittent bleeding form old vascular access port and associated hematoma. No s/s severe bleeding, i.e. No sob, loc, dizziness, cp. Not bleeding currently. Has had to apply pressure and change bandage multiple times over last few days. Has not had dialysis since Friday.   Past Medical History  Diagnosis Date  . Hypertension   . COPD (chronic obstructive pulmonary disease) ([place])   . Hyperlipidemia   . Cardiomyopathy     EF of 30% per echo in June of 2012; admitted with congestive heart failure; nonobstructive CAD on cath in 08/2010  . History of noncompliance with medical treatment   . Cerebrovascular disease 2011    CVA  in 02/2010-only deficit is decreased vision in  right eye  . Alcohol abuse     wife states he was never heavy drinker  . Tobacco abuse     40 pack years  . Abdominal aortic aneurysm ([place])     Stent graft repair  . Chronic systolic CHF (congestive heart failure) ([place])   . Leg pain   . CKD (chronic kidney disease) stage 3, GFR 30-59 ml/min     creatinine-1.7 in 08/2010  . CAD (coronary artery disease)   . Myocardial infarction acute ([place]) 10/01/11  . CKD (chronic kidney disease), stage III 12/06/2012  . Effusion of right knee joint 12/06/2012  . Anemia   . Atrial fibrillation ([place])   . Ataxic gait   . Weakness generalized   . Frequent falls   . Forgetfulness   . Stroke [facility]) 2011    decreased vision of right eye    Most recent 05/23/14 - bleed - has a drag to his left leg  . Depression   . Anxiety     Takes Citalopram  . Arthritis     knees  . Renal insufficiency   . Shortness of breath dyspnea     sometimes after dialysis  . Dementia     early  onset  . Enlarged prostate   . GERD (gastroesophageal reflux disease)   . Constipation    Past Surgical History  Procedure Laterality Date  . Colonoscopy w/ polypectomy  2006    Dr. Tamala Julian: anal fissure, sessile adenomatous polyp, diverticulosis  . Esophagogastroduodenoscopy  11/15/2010    Procedure: ESOPHAGOGASTRODUODENOSCOPY (EGD);  Surgeon: Dorothyann Peng, MD;  [facility]: [facility];  Service: Endoscopy;  Laterality: N/A;  with propofol sedation; procedure start @ 0813  . Flexible sigmoidoscopy  11/15/2010    Procedure: FLEXIBLE SIGMOIDOSCOPY;  Surgeon: Dorothyann Peng, MD;  [facility];  Service: Endoscopy;  Laterality: N/A;  with propofol sedation; ended @ 0808  . Polypectomy  12/13/2010    Procedure: POLYPECTOMY;  Surgeon: Dorothyann Peng, MD;  [facility];  Service: Endoscopy;  Laterality: N/A;  right colon, cecal, transverse, and sigmoid polypectomy  . Cardiac catheterization  10/04/11    Normal left main, 95% pLAD stenosis with ulceration s/p successful BMS placement, 30% segmental plaque beyond this, dLAD after D2 with 50% plaquing, then focal 70% lesion, 80% focal short D2 stenosis, 30% pOM2 lesion, 30-40% mOM3 stenosis, Shephard's crook region of RCA with 50% lesion, mRCA 50-60% lesion and mild dRCA irregularities.   Marland Kitchen  Coronary stent placement  10/04/11  . Abdominal aortic aneurysm repair w/ endoluminal graft      01/16/2008  . Left heart catheterization with coronary angiogram N/A 10/04/2011    Procedure: LEFT HEART CATHETERIZATION WITH CORONARY ANGIOGRAM;  Surgeon: Hillary Bow, MD;  Location: [facility] CATH LAB;  Service: Cardiovascular;  Laterality: N/A;  . Av fistula placement Left 07/07/2014    Procedure: Creation of Left Arm ARTERIOVENOUS Fistula;  Surgeon: Rosetta Posner, MD;  Location: [place];  Service: Vascular;  Laterality: Left;  . Insertion of dialysis catheter Right 08/05/2014    Procedure: INSERTION OF DIALYSIS CATHETER Right internal Jugular;  Surgeon: Conrad Juab, MD;   Location: [place];  Service: Vascular;  Laterality: Right;  . Port-a-cath removal    . Peripheral vascular catheterization Left 01/15/2015    Procedure: Fistulagram;  Surgeon: Conrad [place], MD;  Location: [place] CV LAB;  Service: Cardiovascular;  Laterality: Left;  arm  . Peripheral vascular catheterization Left 01/15/2015    Procedure: Peripheral Vascular Intervention;  Surgeon: Conrad [place], MD;  Location: [place] CV LAB;  Service: Cardiovascular;  Laterality: Left;  ARM VEINOUS PTA  . Insertion of dialysis catheter Left 02/13/2015    Procedure: INSERTION OF DIALYSIS CATHETER AND CENTRAL VENOGRAM;  Surgeon: Serafina Mitchell, MD;  Location: [place];  Service: Vascular;  Laterality: Left;  . Peripheral vascular catheterization N/A 04/07/2015    Procedure: A/V Shuntogram/Fistulagram;  Surgeon: Conrad [place], MD;  Location: [place] CV LAB;  Service: Cardiovascular;  Laterality: N/A;  . Revison of arteriovenous fistula Left [id number]    Procedure: PLICATION OF LEFT ARM PSEUDOANEURYSM;  Surgeon: Conrad [place], MD;  Location: [place];  Service: Vascular;  Laterality: Left;  . Patch angioplasty Left 04/09/2015    Procedure: PATCH ANGIOPLASTY;  Surgeon: Conrad [place], MD;  Location: [place];  Service: Vascular;  Laterality: Left;  . Ligation of arteriovenous  fistula Left 04/15/2015    Procedure: LIGATION OF BRACHIOCEPHALIC ARTERIOVENOUS  FISTULA;  Surgeon: Mal Misty, MD;  Location: [place];  Service: Vascular;  Laterality: Left;  . Exchange of a dialysis catheter Left 05/14/2015    Procedure: EXCHANGE OF A DIALYSIS CATHETER-LEFT INTERNAL JUGULAR VEIN;  Surgeon: Conrad , MD;  Location: [facility] OR;  Service: Vascular;  Laterality: Left;   Family History  Problem Relation Age of Onset  . Colon cancer Neg Hx   . Anesthesia problems Neg Hx   . Hypotension Neg Hx   . Malignant hyperthermia Neg Hx   . Pseudochol deficiency Neg Hx   . Cancer Mother     Gall Bladder  . Heart disease Mother     before  age 40  . Hyperlipidemia Mother   . Hypertension Mother   . Asthma Mother   . Cancer Sister     Cervical   Social History  Substance Use Topics  . Smoking status: Former Smoker -- 0.50 packs/day for 50 years    Types: Cigarettes    Start date: 06/25/1957    Quit date: 03/04/2013  . Smokeless tobacco: Never Used  . Alcohol Use: No     Comment: None for several years    Review of Systems  Constitutional: Negative for fever and chills.  Eyes: Negative for pain and redness.  Respiratory: Negative for cough and shortness of breath.   Gastrointestinal: Negative for nausea and abdominal pain.  Musculoskeletal: Negative for neck pain.  Skin: Positive for wound.  All other systems  reviewed and are negative.     Allergies  Review of patient's allergies indicates no known allergies.  Home Medications   Prior to Admission medications   Medication Sig Start Date End Date Taking? Authorizing Provider  amLODipine (NORVASC) 5 MG tablet Take 1 tablet (5 mg total) by mouth daily. Patient taking differently: Take 5 mg by mouth every evening.  04/14/15   Herminio Commons, MD  aspirin EC 81 MG tablet Take 81 mg by mouth every evening.     Historical Provider, MD  atorvastatin (LIPITOR) 80 MG tablet TAKE 1 TABLET (80 MG TOTAL) BY MOUTH DAILY AT 6 PM. 06/30/14   Herminio Commons, MD  b complex-vitamin c-folic acid (NEPHRO-VITE) 0.8 MG TABS tablet Take 1 tablet by mouth daily. 12/15/14   Historical Provider, MD  cephALEXin (KEFLEX) 250 MG capsule Take 1 capsule (250 mg total) by mouth daily. 05/25/15   Dorie Rank, MD  citalopram (CELEXA) 10 MG tablet TAKE 1 TABLET BY MOUTH EVERY DAY Patient taking differently: TAKE 1 TABLET BY MOUTH EVERY evening 03/19/15   Kathyrn Drown, MD  ezetimibe (ZETIA) 10 MG tablet Take 1 tablet by mouth daily. 05/12/15   Historical Provider, MD  naproxen sodium (ANAPROX) 220 MG tablet Take 440 mg by mouth daily as needed (pain).    Historical Provider, MD    pantoprazole (PROTONIX) 40 MG tablet TAKE 1 TABLET (40 MG TOTAL) BY MOUTH DAILY. Patient taking differently: TAKE 1 TABLET (40 MG TOTAL) BY MOUTH DAILY. - every evening 03/06/15   Mahala Menghini, PA-C  polyethylene glycol [facility] / GLYCOLAX) packet Take 17 g by mouth daily. 11/29/13   Kathie Dike, MD   BP 192/88 mmHg  Pulse 57  Temp(Src) 97.9 F (36.6 C) (Oral)  Resp 18  Ht 6\' 3"  (1.905 m)  Wt 160 lb (72.576 kg)  BMI 20.00 kg/m2  SpO2 100% Physical Exam  Constitutional: He appears well-developed and well-nourished.  HENT:  Head: Normocephalic and atraumatic.  Neck: Normal range of motion.  Cardiovascular: Normal rate.   Pulmonary/Chest: Effort normal. No respiratory distress.  Abdominal: He exhibits no distension.  Musculoskeletal: Normal range of motion.  Neurological: He is alert.  Skin: Skin is warm and dry. No rash noted. No erythema.  See pic below for area of bleeding  Nursing note and vitals reviewed.      ED Course  Procedures (including critical care time) Labs Review Labs Reviewed  CBC WITH DIFFERENTIAL/PLATELET - Abnormal; Notable for the following:    RBC 3.08 (*)    Hemoglobin 10.1 (*)    HCT 29.9 (*)    Platelets 109 (*)    All other components within normal limits  COMPREHENSIVE METABOLIC PANEL - Abnormal; Notable for the following:    BUN 55 (*)    Creatinine, Ser 8.76 (*)    GFR calc non Af Amer 5 (*)    GFR calc Af Amer 6 (*)    All other components within normal limits  PROTIME-INR - Abnormal; Notable for the following:    Prothrombin Time 15.7 (*)    All other components within normal limits    Imaging Review No results found. I have personally reviewed and evaluated these images and lab results as part of my medical decision-making.   EKG Interpretation None      MDM   Final diagnoses:  Bleeding    70 yo M w/ complication from previous catheter insertion site. Appears to be granulation tissue. Hemostasis obtained with  topical thrombin and pressure dressing. No significant blood loss and asymptomatic at this time.  D/w nephrology regarding lack of dialysis and will get dialysis in AM.  Discussed with vascular surgery on call who thinks stopping the bleeding and following up the next day as scheduled is appropriate.  Family given instructions for wound care home.   I have personally and contemperaneously reviewed labs and imaging and used in my decision making as above.   A medical screening exam was performed and I feel the patient has had an appropriate workup for their chief complaint at this time and likelihood of emergent condition existing is low. Their vital signs are stable. They have been counseled on decision, discharge, follow up and which symptoms necessitate immediate return to the emergency department.  They verbally stated understanding and agreement with plan and discharged in stable condition.     Merrily Pew, MD 05/29/15 [phone]

## 2015-05-27 NOTE — ED Notes (Signed)
Per EMS patient from [facility]. Old port access is bleeding. Patient alert and oriented x 4. Bleeding under control. NAD.

## 2015-05-28 ENCOUNTER — Encounter (HOSPITAL_COMMUNITY): Payer: Self-pay

## 2015-05-28 ENCOUNTER — Ambulatory Visit (INDEPENDENT_AMBULATORY_CARE_PROVIDER_SITE_OTHER): Payer: Medicare Other | Admitting: Vascular Surgery

## 2015-05-28 ENCOUNTER — Encounter: Payer: Self-pay | Admitting: Vascular Surgery

## 2015-05-28 ENCOUNTER — Emergency Department (HOSPITAL_COMMUNITY)
Admission: EM | Admit: 2015-05-28 | Discharge: 2015-05-29 | Disposition: A | Discharge status: Home / Self Care | Payer: Medicare Other | Attending: Emergency Medicine | Admitting: Emergency Medicine

## 2015-05-28 VITALS — BP 131/85 | HR 90 | Temp 98.5°F | Resp 16 | Ht 75.0 in | Wt 160.0 lb

## 2015-05-28 DIAGNOSIS — Y829 Unspecified medical devices associated with adverse incidents: Secondary | ICD-10-CM | POA: Diagnosis not present

## 2015-05-28 DIAGNOSIS — F329 Major depressive disorder, single episode, unspecified: Secondary | ICD-10-CM | POA: Diagnosis not present

## 2015-05-28 DIAGNOSIS — I252 Old myocardial infarction: Secondary | ICD-10-CM | POA: Insufficient documentation

## 2015-05-28 DIAGNOSIS — I2510 Atherosclerotic heart disease of native coronary artery without angina pectoris: Secondary | ICD-10-CM | POA: Diagnosis not present

## 2015-05-28 DIAGNOSIS — N183 Chronic kidney disease, stage 3 (moderate): Secondary | ICD-10-CM | POA: Insufficient documentation

## 2015-05-28 DIAGNOSIS — T148XXA Other injury of unspecified body region, initial encounter: Secondary | ICD-10-CM

## 2015-05-28 DIAGNOSIS — I129 Hypertensive chronic kidney disease with stage 1 through stage 4 chronic kidney disease, or unspecified chronic kidney disease: Secondary | ICD-10-CM | POA: Insufficient documentation

## 2015-05-28 DIAGNOSIS — Z7982 Long term (current) use of aspirin: Secondary | ICD-10-CM | POA: Diagnosis not present

## 2015-05-28 DIAGNOSIS — D689 Coagulation defect, unspecified: Secondary | ICD-10-CM | POA: Diagnosis present

## 2015-05-28 DIAGNOSIS — T82838A Hemorrhage of vascular prosthetic devices, implants and grafts, initial encounter: Secondary | ICD-10-CM | POA: Insufficient documentation

## 2015-05-28 DIAGNOSIS — Z79899 Other long term (current) drug therapy: Secondary | ICD-10-CM | POA: Insufficient documentation

## 2015-05-28 DIAGNOSIS — E785 Hyperlipidemia, unspecified: Secondary | ICD-10-CM | POA: Insufficient documentation

## 2015-05-28 DIAGNOSIS — N186 End stage renal disease: Secondary | ICD-10-CM

## 2015-05-28 DIAGNOSIS — Z87891 Personal history of nicotine dependence: Secondary | ICD-10-CM | POA: Insufficient documentation

## 2015-05-28 DIAGNOSIS — I5040 Unspecified combined systolic (congestive) and diastolic (congestive) heart failure: Secondary | ICD-10-CM | POA: Insufficient documentation

## 2015-05-28 LAB — CBC WITH DIFFERENTIAL/PLATELET
Basophils Absolute: 0 10*3/uL (ref 0.0–0.1)
Basophils Relative: 1 %
EOS ABS: 0.3 10*3/uL (ref 0.0–0.7)
EOS PCT: 4 %
HCT: 29.3 % — ABNORMAL LOW (ref 39.0–52.0)
Hemoglobin: 10 g/dL — ABNORMAL LOW (ref 13.0–17.0)
LYMPHS ABS: 2.3 10*3/uL (ref 0.7–4.0)
Lymphocytes Relative: 35 %
MCH: 32.7 pg (ref 26.0–34.0)
MCHC: 34.1 g/dL (ref 30.0–36.0)
MCV: 95.8 fL (ref 78.0–100.0)
MONO ABS: 0.5 10*3/uL (ref 0.1–1.0)
MONOS PCT: 7 %
Neutro Abs: 3.4 10*3/uL (ref 1.7–7.7)
Neutrophils Relative %: 53 %
Platelets: 121 10*3/uL — ABNORMAL LOW (ref 150–400)
RBC: 3.06 MIL/uL — ABNORMAL LOW (ref 4.22–5.81)
RDW: 14 % (ref 11.5–15.5)
WBC: 6.5 10*3/uL (ref 4.0–10.5)

## 2015-05-28 LAB — BASIC METABOLIC PANEL
Anion gap: 9 (ref 5–15)
BUN: 25 mg/dL — AB (ref 6–20)
CALCIUM: 8.9 mg/dL (ref 8.9–10.3)
CO2: 27 mmol/L (ref 22–32)
CREATININE: 5.33 mg/dL — AB (ref 0.61–1.24)
Chloride: 100 mmol/L — ABNORMAL LOW (ref 101–111)
GFR calc non Af Amer: 10 mL/min — ABNORMAL LOW (ref >60)
GFR, EST AFRICAN AMERICAN: 11 mL/min — AB (ref >60)
Glucose, Bld: 98 mg/dL (ref 65–99)
Potassium: 4.1 mmol/L (ref 3.5–5.1)
SODIUM: 136 mmol/L (ref 135–145)

## 2015-05-28 LAB — APTT: aPTT: 33 seconds (ref 24–37)

## 2015-05-28 LAB — PROTIME-INR
INR: 1.13 (ref 0.00–1.49)
PROTHROMBIN TIME: 14.7 s (ref 11.6–15.2)

## 2015-05-28 NOTE — ED Notes (Signed)
Wife states she saw blood in the bed from patients dialysis port. Upon arrival to ED bleeding is controlled, and dried blood noticed to dressing. No distress voiced from patient.

## 2015-05-28 NOTE — ED Provider Notes (Signed)
CSN: [id number]     Arrival date & time 05/28/15  2105 History  By signing my name below, I, Meriel Pica, attest that this documentation has been prepared under the direction and in the presence of Forde Dandy, MD. Electronically Signed: Meriel Pica, ED Scribe. 05/28/2015. 11:00 PM.   Chief Complaint  Patient presents with  . Coagulation Disorder   The history is provided by the spouse and the patient.   HPI Comments: [name] is a 70 y.o. male, with a h/o chronic renal failure on hemodialysis M,W,F, who presents to the Emergency Department complaining of constant bleeding from dialysis catheter to left subclavian region that has been persistent following vascular office visit this afternoon. The pt received dialysis today when bleeding from dialysis site first occurred and he was subsequently evaluated in the ED. On ED visit this morning thrombin and a pressure dressing was applied to the area with successful coagulation. The pt was evaluated at vascular surgeon Dr. Denice Bors office after ED visit who removed clots from the area. Per wife, bleeding has been having bleeding since after this office visit. the pt was advised to apply compresses to the site and wound recheck was ordered for tomorrow by a home health nurse. Pt denies SOB, pre-syncopal feeling, dizziness, or any other complaints. He is not taking blood thinning medication.    Past Medical History  Diagnosis Date  . Hypertension   . COPD (chronic obstructive pulmonary disease) ([place])   . Hyperlipidemia   . Cardiomyopathy     EF of 30% per echo in June of 2012; admitted with congestive heart failure; nonobstructive CAD on cath in 08/2010  . History of noncompliance with medical treatment   . Cerebrovascular disease 2011    CVA  in 02/2010-only deficit is decreased vision in  right eye  . Alcohol abuse     wife states he was never heavy drinker  . Tobacco abuse     40 pack years  . Abdominal aortic aneurysm ([place])      Stent graft repair  . Chronic systolic CHF (congestive heart failure) ([place])   . Leg pain   . CKD (chronic kidney disease) stage 3, GFR 30-59 ml/min     creatinine-1.7 in 08/2010  . CAD (coronary artery disease)   . Myocardial infarction acute ([place]) 10/01/11  . CKD (chronic kidney disease), stage III 12/06/2012  . Effusion of right knee joint 12/06/2012  . Anemia   . Atrial fibrillation ([place])   . Ataxic gait   . Weakness generalized   . Frequent falls   . Forgetfulness   . Stroke [facility]) 2011    decreased vision of right eye    Most recent 05/23/14 - bleed - has a drag to his left leg  . Depression   . Anxiety     Takes Citalopram  . Arthritis     knees  . Renal insufficiency   . Shortness of breath dyspnea     sometimes after dialysis  . Dementia     early onset  . Enlarged prostate   . GERD (gastroesophageal reflux disease)   . Constipation    Past Surgical History  Procedure Laterality Date  . Colonoscopy w/ polypectomy  2006    Dr. Tamala Julian: anal fissure, sessile adenomatous polyp, diverticulosis  . Esophagogastroduodenoscopy  11/15/2010    Procedure: ESOPHAGOGASTRODUODENOSCOPY (EGD);  Surgeon: Dorothyann Peng, MD;  Location: [facility];  Service: Endoscopy;  Laterality: N/A;  with propofol sedation; procedure start @  [id number]  . Flexible sigmoidoscopy  11/15/2010    Procedure: FLEXIBLE SIGMOIDOSCOPY;  Surgeon: Dorothyann Peng, MD;  Location: [facility];  Service: Endoscopy;  Laterality: N/A;  with propofol sedation; ended @ 0808  . Polypectomy  12/13/2010    Procedure: POLYPECTOMY;  Surgeon: Dorothyann Peng, MD;  Location: [facility];  Service: Endoscopy;  Laterality: N/A;  right colon, cecal, transverse, and sigmoid polypectomy  . Cardiac catheterization  10/04/11    Normal left main, 95% pLAD stenosis with ulceration s/p successful BMS placement, 30% segmental plaque beyond this, dLAD after D2 with 50% plaquing, then focal 70% lesion, 80% focal short D2 stenosis, 30% pOM2 lesion, 30-40% mOM3 stenosis,  Shephard's crook region of RCA with 50% lesion, mRCA 50-60% lesion and mild dRCA irregularities.   . Coronary stent placement  10/04/11  . Abdominal aortic aneurysm repair w/ endoluminal graft      01/16/2008  . Left heart catheterization with coronary angiogram N/A 10/04/2011    Procedure: LEFT HEART CATHETERIZATION WITH CORONARY ANGIOGRAM;  Surgeon: Hillary Bow, MD;  Location: [facility] CATH LAB;  Service: Cardiovascular;  Laterality: N/A;  . Av fistula placement Left 07/07/2014    Procedure: Creation of Left Arm ARTERIOVENOUS Fistula;  Surgeon: Rosetta Posner, MD;  Location: [place];  Service: Vascular;  Laterality: Left;  . Insertion of dialysis catheter Right 08/05/2014    Procedure: INSERTION OF DIALYSIS CATHETER Right internal Jugular;  Surgeon: Conrad Evan, MD;  Location: [place];  Service: Vascular;  Laterality: Right;  . Port-a-cath removal    . Peripheral vascular catheterization Left 01/15/2015    Procedure: Fistulagram;  Surgeon: Conrad Barceloneta, MD;  Location: [place] CV LAB;  Service: Cardiovascular;  Laterality: Left;  arm  . Peripheral vascular catheterization Left 01/15/2015    Procedure: Peripheral Vascular Intervention;  Surgeon: Conrad Nuevo, MD;  Location: [place] CV LAB;  Service: Cardiovascular;  Laterality: Left;  ARM VEINOUS PTA  . Insertion of dialysis catheter Left 02/13/2015    Procedure: INSERTION OF DIALYSIS CATHETER AND CENTRAL VENOGRAM;  Surgeon: Serafina Mitchell, MD;  Location: [place];  Service: Vascular;  Laterality: Left;  . Peripheral vascular catheterization N/A 04/07/2015    Procedure: A/V Shuntogram/Fistulagram;  Surgeon: Conrad Delta, MD;  Location: [place] CV LAB;  Service: Cardiovascular;  Laterality: N/A;  . Revison of arteriovenous fistula Left [id number]    Procedure: PLICATION OF LEFT ARM PSEUDOANEURYSM;  Surgeon: Conrad St. Ann, MD;  Location: [place];  Service: Vascular;  Laterality: Left;  . Patch angioplasty Left 04/09/2015    Procedure: PATCH  ANGIOPLASTY;  Surgeon: Conrad Preston, MD;  Location: [place];  Service: Vascular;  Laterality: Left;  . Ligation of arteriovenous  fistula Left 04/15/2015    Procedure: LIGATION OF BRACHIOCEPHALIC ARTERIOVENOUS  FISTULA;  Surgeon: Mal Misty, MD;  Location: [place];  Service: Vascular;  Laterality: Left;  . Exchange of a dialysis catheter Left 05/14/2015    Procedure: EXCHANGE OF A DIALYSIS CATHETER-LEFT INTERNAL JUGULAR VEIN;  Surgeon: Conrad Centre, MD;  Location: [facility] OR;  Service: Vascular;  Laterality: Left;   Family History  Problem Relation Age of Onset  . Colon cancer Neg Hx   . Anesthesia problems Neg Hx   . Hypotension Neg Hx   . Malignant hyperthermia Neg Hx   . Pseudochol deficiency Neg Hx   . Cancer Mother     Gall Bladder  . Heart disease Mother     before age  45  . Hyperlipidemia Mother   . Hypertension Mother   . Asthma Mother   . Cancer Sister     Cervical   Social History  Substance Use Topics  . Smoking status: Former Smoker -- 0.50 packs/day for 50 years    Types: Cigarettes    Start date: 06/25/1957    Quit date: 03/04/2013  . Smokeless tobacco: Never Used  . Alcohol Use: No     Comment: None for several years    Review of Systems  Respiratory: Negative for shortness of breath.   Neurological: Negative for dizziness and syncope.  Hematological: Does not bruise/bleed easily ( no use of anticoagulants ).  All other systems reviewed and are negative.  Allergies  Review of patient's allergies indicates no known allergies.  Home Medications   Prior to Admission medications   Medication Sig Start Date End Date Taking? Authorizing Provider  amLODipine (NORVASC) 5 MG tablet Take 1 tablet (5 mg total) by mouth daily. Patient taking differently: Take 5 mg by mouth every evening.  04/14/15  Yes Herminio Commons, MD  aspirin EC 81 MG tablet Take 81 mg by mouth every evening.    Yes Historical Provider, MD  atorvastatin (LIPITOR) 80 MG tablet TAKE 1 TABLET (80 MG  TOTAL) BY MOUTH DAILY AT 6 PM. 06/30/14  Yes Herminio Commons, MD  b complex-vitamin c-folic acid (NEPHRO-VITE) 0.8 MG TABS tablet Take 1 tablet by mouth daily. 12/15/14  Yes Historical Provider, MD  cephALEXin (KEFLEX) 250 MG capsule Take 1 capsule (250 mg total) by mouth daily. 05/25/15  Yes Dorie Rank, MD  citalopram (CELEXA) 10 MG tablet TAKE 1 TABLET BY MOUTH EVERY DAY Patient taking differently: TAKE 1 TABLET BY MOUTH EVERY evening 03/19/15  Yes Kathyrn Drown, MD  ezetimibe (ZETIA) 10 MG tablet Take 1 tablet by mouth daily. 05/12/15  Yes Historical Provider, MD  naproxen sodium (ANAPROX) 220 MG tablet Take 440 mg by mouth daily as needed (pain). Reported on 05/27/2015   Yes Historical Provider, MD  pantoprazole (PROTONIX) 40 MG tablet TAKE 1 TABLET (40 MG TOTAL) BY MOUTH DAILY. Patient taking differently: TAKE 1 TABLET (40 MG TOTAL) BY MOUTH DAILY. - every evening 03/06/15  Yes Mahala Menghini, PA-C  polyethylene glycol (MIRALAX / GLYCOLAX) packet Take 17 g by mouth daily. 11/29/13  Yes Kathie Dike, MD   BP 150/68 mmHg  Pulse 76  Temp(Src) 98.1 F (36.7 C) (Oral)  Resp 18  Ht 6\' 3"  (1.905 m)  Wt 160 lb (72.576 kg)  BMI 20.00 kg/m2  SpO2 100% Physical Exam Physical Exam  Nursing note and vitals reviewed. Constitutional: Well developed, well nourished, non-toxic, and in no acute distress Head: Normocephalic and atraumatic.  Mouth/Throat: Oropharynx is clear and moist.  Neck: Normal range of motion. Neck supple.  Cardiovascular: Normal rate and regular rhythm.   Pulmonary/Chest: Effort normal and breath sounds normal. Large hematoma with active bleeding noted from old dialysis catheter site.  Abdominal: Soft. There is no tenderness. There is no rebound and no guarding.  Musculoskeletal: Normal range of motion.  Neurological: Alert, no facial droop, fluent speech, moves all extremities symmetrically Skin: Skin is warm and dry.  Psychiatric: Cooperative  ED Course  Procedures   DIAGNOSTIC STUDIES: Oxygen Saturation is 100% on RA, normal by my interpretation.    COORDINATION OF CARE: 10:45 PM Discussed treatment plan with pt at bedside and pt agreed to plan.   Labs Review Labs Reviewed  CBC WITH DIFFERENTIAL/PLATELET -  Abnormal; Notable for the following:    RBC 3.06 (*)    Hemoglobin 10.0 (*)    HCT 29.3 (*)    Platelets 121 (*)    All other components within normal limits  BASIC METABOLIC PANEL - Abnormal; Notable for the following:    Chloride 100 (*)    BUN 25 (*)    Creatinine, Ser 5.33 (*)    GFR calc non Af Amer 10 (*)    GFR calc Af Amer 11 (*)    All other components within normal limits  PROTIME-INR  APTT  I have personally reviewed and evaluated these lab results as part of my medical decision-making.   Imaging Review No results found.   EKG Interpretation None      MDM   Final diagnoses:  Hematoma  Bleeding from dialysis shunt, initial encounter [facility])    70 year old male with history of ESRD who presents with bleeding form old dialysis site. HD stable with active bleeding noted on arrival. Bleeding stopped with application of pressure and surgicel. Stable blood work and had dialysis session today. Was already seen in vascular clinic today with packing and hematoma evacuation from bleeding earlier this week. Patient has wound check appointment later this morning, which patient and wife were encouraged to have re-check. Strict return and follow-up instructions reviewed. She expressed understanding of all discharge instructions and felt comfortable with the plan of care.   I personally performed the services described in this documentation, which was scribed in my presence. The recorded information has been reviewed and is accurate.    Forde Dandy, MD 05/29/15 [phone]

## 2015-05-28 NOTE — ED Notes (Signed)
Dr. Liu at bedside 

## 2015-05-28 NOTE — Progress Notes (Signed)
    Postoperative Access Visit   History of Present Illness  [name] is a 70 y.o. year old male who presents for postoperative follow-up for: LIJ TDC exchange (Date: 05/14/15).  The patient was seen in ED with bleeding from prior [facility] exit site.  This patient was fine with minimal neck swelling until last Friday when he developed a swelling over the [facility] insertion site.  He then went to the ED to be evaluated.  For VQI Use Only  PRE-ADM LIVING: Home  AMB STATUS: Ambulatory  Physical Examination Filed Vitals:   05/28/15 1436  BP: 131/85  Pulse: 90  Temp: 98.5 F (36.9 C)  Resp: 16    L neck: prior exit site next to neck cannulation incision has moderate sized hematoma decompressing through the exit site, organize thrombus removed and some of tunnel hematoma evacuated manually  Medical Decision Making  [name] is a 70 y.o. year old male who presents s/p LIJ TDC exchange .  Some of this patient's bleeding is medical bleeding.  He needs to have a complete HD session to help with uremic platelet failure.  Continue abx given by ED  Warm compress to [facility] exit site to facilitate drainage.   Need to facilitate evacuation of hematoma completed.  Home health wound care: pack exit site with Iodoform gauze.  Follow up wound check tomorrow.  Thank you for allowing [place] to participate in this patient's care.  Adele Barthel, MD [facility] [place] Office: [phone] Pager: [phone]  05/28/2015, 3:23 PM

## 2015-05-28 NOTE — ED Notes (Addendum)
Pressure bandage applied by wife to left old port site. Dried blood noted on bandage

## 2015-05-29 ENCOUNTER — Other Ambulatory Visit: Payer: Self-pay

## 2015-05-29 ENCOUNTER — Encounter (HOSPITAL_COMMUNITY): Payer: Self-pay | Admitting: *Deleted

## 2015-05-29 MED ORDER — "THROMBI-PAD 3""X3"" EX PADS"
MEDICATED_PAD | CUTANEOUS | Status: AC
  Filled 2015-05-29: qty 1

## 2015-05-29 NOTE — Progress Notes (Signed)
Pt SDW-pre-op call completed by pt spouse and POA, C. Chrissie Noa. Spouse denies pt C/O andy acute cardiopulmonary issues. Pt is under the care of Dr. Bronson Ing, Cardiology. Spouse made aware to hold NSAID's, vitamins, fish oil and herbal medications. Spouse verbalized understanding of all pre-op instructions. Pt history to be reviewed by anesthesia.

## 2015-05-29 NOTE — Discharge Instructions (Signed)
Bleeding from your old dialysis site is controlled.  Return for worsening bleeding, feeling lightheaded or passing out, fatigue or difficulty breathing, or any other symptoms concerning to you.  Call your vascular surgeon tomorrow to discuss close follow-up.

## 2015-05-29 NOTE — Progress Notes (Signed)
Anesthesia Chart Review: SAME DAY WORK-UP.  Patient is a 70 year old male scheduled for removal of left dialysis catheter, insertion of right dialysis catheter, evaluation of left neck hematoma on 05/30/15 by Dr. Harrie Foreman.  History includes ESRD on HD (MWF) s/p multiple access procedure (last left IJ catheter exchange 05/14/15), former smoker, HTN, HLD, COPD, CAD/MI s/p LAD BMS '13, chronic systolic CHF, cardiomyopathy, afib, ataxic gait, CVA '11, AAA s/p EVAR 01/16/08, GERD, BPH, dementia, anxiety, ETOH abuse (sober for years). PCP is listed as Dr. [name].  Cardiologist is Dr. Bronson Ing, last visit 04/14/15. His note indicates that patient has a history of multiple TIAs but only on ASAS due to high risk of ICH due to prior history of hemorrhagic strokes and high fall risk.  05/27/15 EKG: SR, old anterior infarct, borderline repolarization abnormality.  05/24/14 Echo: Study Conclusions - Left ventricle: The cavity size was normal. There was moderate concentric hypertrophy. Systolic function was normal. The estimated ejection fraction was in the range of 50% to 55%. Wall motion was normal; there were no regional wall motion abnormalities. Doppler parameters are consistent with abnormal left ventricular relaxation (grade 1 diastolic dysfunction). Doppler parameters are consistent with high ventricular filling pressure. - Ventricular septum: Septal motion showed abnormal function and dyssynergy. These changes are consistent with intraventricular conduction delay. - Aortic valve: Mildly thickened, mildly calcified leaflets. There was moderate regurgitation. - Mitral valve: There was trivial regurgitation. - Left atrium: The atrium was moderately dilated. Volume/bsa, ES (1-plane Simpson&'s, A4C): 37.1 ml/m^2. - Right atrium: The atrium was mildly dilated. - Tricuspid valve: There was trivial regurgitation.  10/04/11 LHC: Coronary angiography: Coronary dominance: right -  Left mainstem: No obstruction - Left anterior descending (LAD): 95% proximal lesion just after tiny diagonal and before septal. There is 30% segmental plaque beyond this. The distal LAD after the second diagonal has about 50% plaquing then a focal 70% lesion. The distal vessel wraps the apex. The second diagonal has an 80% focal short discrete lesion. The diagonal is only modest in size.  - Left circumflex (LCx): Consists of three major OM branches. The second branch has 30% segmental plaque proximally. The AV circ has 30-405 mid plaque (OM3). No critical disease.  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 There is also a mid 50-60% lesion. There is mild irregularity distally. The PDA is moderate in size.  - Left ventriculography:Not done Final Conclusions:  1. Critical proximal LAD lesion proximally with large vessel distribution successfully stented with BMS. See report. DAPT for 12 months, if possible. 2. Residual LAD disease as noted (Dr. Burt Knack and I reviewed and agree conservative management for now of other disease) 3. CKD, stage 3  05/24/14 Carotid U/S: Summary: Bilateral: intimal wall thickening CCA. Mild mixed plaque origin ICA. 1-39% ICA stenosis. Vertebral artery flow is antegrade.  05/14/15 1V CXR: IMPRESSION: No pneumothorax after central line placement.  Labs from 05/28/15 noted. He will need ISTAT4 on arrival.   George Hugh [facility] Short Stay Center/Anesthesiology Phone [phone] 05/29/2015 5:37 PM

## 2015-05-30 ENCOUNTER — Ambulatory Visit (HOSPITAL_COMMUNITY): Payer: Medicare Other | Admitting: Anesthesiology

## 2015-05-30 ENCOUNTER — Ambulatory Visit (HOSPITAL_COMMUNITY): Payer: Medicare Other

## 2015-05-30 ENCOUNTER — Ambulatory Visit (HOSPITAL_COMMUNITY)
Admission: RE | Admit: 2015-05-30 | Discharge: 2015-05-30 | Disposition: A | Discharge status: Home Health | Payer: Medicare Other | Source: Ambulatory Visit | Attending: Vascular Surgery | Admitting: Vascular Surgery

## 2015-05-30 ENCOUNTER — Encounter (HOSPITAL_COMMUNITY): Payer: Self-pay | Admitting: *Deleted

## 2015-05-30 ENCOUNTER — Encounter (HOSPITAL_COMMUNITY)
Admission: RE | Disposition: A | Discharge status: Home Health | Payer: Self-pay | Source: Ambulatory Visit | Attending: Vascular Surgery

## 2015-05-30 DIAGNOSIS — I5022 Chronic systolic (congestive) heart failure: Secondary | ICD-10-CM | POA: Diagnosis not present

## 2015-05-30 DIAGNOSIS — E785 Hyperlipidemia, unspecified: Secondary | ICD-10-CM | POA: Insufficient documentation

## 2015-05-30 DIAGNOSIS — T82868A Thrombosis of vascular prosthetic devices, implants and grafts, initial encounter: Secondary | ICD-10-CM | POA: Diagnosis not present

## 2015-05-30 DIAGNOSIS — N186 End stage renal disease: Secondary | ICD-10-CM | POA: Insufficient documentation

## 2015-05-30 DIAGNOSIS — Z9889 Other specified postprocedural states: Secondary | ICD-10-CM

## 2015-05-30 DIAGNOSIS — T82898A Other specified complication of vascular prosthetic devices, implants and grafts, initial encounter: Secondary | ICD-10-CM | POA: Insufficient documentation

## 2015-05-30 DIAGNOSIS — I2510 Atherosclerotic heart disease of native coronary artery without angina pectoris: Secondary | ICD-10-CM | POA: Diagnosis not present

## 2015-05-30 DIAGNOSIS — Z419 Encounter for procedure for purposes other than remedying health state, unspecified: Secondary | ICD-10-CM

## 2015-05-30 DIAGNOSIS — Z992 Dependence on renal dialysis: Secondary | ICD-10-CM | POA: Diagnosis not present

## 2015-05-30 DIAGNOSIS — I252 Old myocardial infarction: Secondary | ICD-10-CM | POA: Insufficient documentation

## 2015-05-30 DIAGNOSIS — J449 Chronic obstructive pulmonary disease, unspecified: Secondary | ICD-10-CM | POA: Diagnosis not present

## 2015-05-30 DIAGNOSIS — Y832 Surgical operation with anastomosis, bypass or graft as the cause of abnormal reaction of the patient, or of later complication, without mention of misadventure at the time of the procedure: Secondary | ICD-10-CM | POA: Diagnosis not present

## 2015-05-30 DIAGNOSIS — F0390 Unspecified dementia without behavioral disturbance: Secondary | ICD-10-CM | POA: Diagnosis not present

## 2015-05-30 DIAGNOSIS — L7632 Postprocedural hematoma of skin and subcutaneous tissue following other procedure: Secondary | ICD-10-CM | POA: Diagnosis present

## 2015-05-30 DIAGNOSIS — I97638 Postprocedural hematoma of a circulatory system organ or structure following other circulatory system procedure: Secondary | ICD-10-CM | POA: Diagnosis not present

## 2015-05-30 DIAGNOSIS — I120 Hypertensive chronic kidney disease with stage 5 chronic kidney disease or end stage renal disease: Secondary | ICD-10-CM | POA: Diagnosis not present

## 2015-05-30 DIAGNOSIS — Z87891 Personal history of nicotine dependence: Secondary | ICD-10-CM | POA: Insufficient documentation

## 2015-05-30 DIAGNOSIS — N185 Chronic kidney disease, stage 5: Secondary | ICD-10-CM | POA: Diagnosis not present

## 2015-05-30 HISTORY — PX: REMOVAL OF A DIALYSIS CATHETER: SHX6053

## 2015-05-30 HISTORY — PX: HEMATOMA EVACUATION: SHX5118

## 2015-05-30 HISTORY — PX: INSERTION OF DIALYSIS CATHETER: SHX1324

## 2015-05-30 LAB — POCT I-STAT 4, (NA,K, GLUC, HGB,HCT)
GLUCOSE: 86 mg/dL (ref 65–99)
HCT: 34 % — ABNORMAL LOW (ref 39.0–52.0)
Hemoglobin: 11.6 g/dL — ABNORMAL LOW (ref 13.0–17.0)
Potassium: 4.8 mmol/L (ref 3.5–5.1)
Sodium: 141 mmol/L (ref 135–145)

## 2015-05-30 SURGERY — REMOVAL, DIALYSIS CATHETER
Anesthesia: General | Laterality: Right

## 2015-05-30 MED ORDER — THROMBIN 20000 UNITS EX SOLR
CUTANEOUS | Status: AC
  Filled 2015-05-30: qty 20000

## 2015-05-30 MED ORDER — LIDOCAINE HCL (CARDIAC) 20 MG/ML IV SOLN
INTRAVENOUS | Status: DC | PRN
  Administered 2015-05-30: 50 mg via INTRAVENOUS

## 2015-05-30 MED ORDER — FENTANYL CITRATE (PF) 100 MCG/2ML IJ SOLN
INTRAMUSCULAR | Status: DC | PRN
  Administered 2015-05-30: 25 ug via INTRAVENOUS
  Administered 2015-05-30: 50 ug via INTRAVENOUS
  Administered 2015-05-30: 25 ug via INTRAVENOUS

## 2015-05-30 MED ORDER — HEMOSTATIC AGENTS (NO CHARGE) OPTIME
TOPICAL | Status: DC | PRN
  Administered 2015-05-30: 1 via TOPICAL

## 2015-05-30 MED ORDER — HEPARIN SODIUM (PORCINE) 5000 UNIT/ML IJ SOLN
INTRAMUSCULAR | Status: DC | PRN
  Administered 2015-05-30: 500 mL

## 2015-05-30 MED ORDER — PROMETHAZINE HCL 25 MG/ML IJ SOLN: 6.2500 mg | INTRAMUSCULAR | Status: DC | PRN

## 2015-05-30 MED ORDER — CHLORHEXIDINE GLUCONATE CLOTH 2 % EX PADS: 6.0000 | MEDICATED_PAD | Freq: Once | CUTANEOUS | Status: DC

## 2015-05-30 MED ORDER — PROPOFOL 10 MG/ML IV BOLUS
INTRAVENOUS | Status: AC
  Filled 2015-05-30: qty 20

## 2015-05-30 MED ORDER — OXYCODONE-ACETAMINOPHEN 5-325 MG PO TABS: 1.0000 | ORAL_TABLET | Freq: Four times a day (QID) | ORAL | Status: DC | PRN

## 2015-05-30 MED ORDER — FENTANYL CITRATE (PF) 250 MCG/5ML IJ SOLN
INTRAMUSCULAR | Status: AC
  Filled 2015-05-30: qty 5

## 2015-05-30 MED ORDER — PHENYLEPHRINE HCL 10 MG/ML IJ SOLN
INTRAMUSCULAR | Status: DC | PRN
  Administered 2015-05-30 (×2): 80 ug via INTRAVENOUS

## 2015-05-30 MED ORDER — LIDOCAINE-EPINEPHRINE (PF) 1 %-1:200000 IJ SOLN
INTRAMUSCULAR | Status: AC
  Filled 2015-05-30: qty 30

## 2015-05-30 MED ORDER — FENTANYL CITRATE (PF) 100 MCG/2ML IJ SOLN: 25.0000 ug | INTRAMUSCULAR | Status: DC | PRN

## 2015-05-30 MED ORDER — SODIUM CHLORIDE 0.9 % IV SOLN
INTRAVENOUS | Status: DC
  Administered 2015-05-30: 09:00:00 via INTRAVENOUS

## 2015-05-30 MED ORDER — 0.9 % SODIUM CHLORIDE (POUR BTL) OPTIME
TOPICAL | Status: DC | PRN
  Administered 2015-05-30: 1000 mL

## 2015-05-30 MED ORDER — HEPARIN SODIUM (PORCINE) 1000 UNIT/ML IJ SOLN
INTRAMUSCULAR | Status: AC
  Filled 2015-05-30: qty 1

## 2015-05-30 MED ORDER — PROPOFOL 10 MG/ML IV BOLUS
INTRAVENOUS | Status: DC | PRN
  Administered 2015-05-30: 150 mg via INTRAVENOUS

## 2015-05-30 MED ORDER — ALBUMIN HUMAN 5 % IV SOLN
INTRAVENOUS | Status: DC | PRN
  Administered 2015-05-30: 10:00:00 via INTRAVENOUS

## 2015-05-30 MED ORDER — HEPARIN SODIUM (PORCINE) 1000 UNIT/ML IJ SOLN
INTRAMUSCULAR | Status: DC | PRN
  Administered 2015-05-30: 6 mL

## 2015-05-30 MED ORDER — DEXTROSE 5 % IV SOLN
1.5000 g | INTRAVENOUS | Status: AC
  Administered 2015-05-30: 1.5 g via INTRAVENOUS
  Filled 2015-05-30: qty 1.5

## 2015-05-30 SURGICAL SUPPLY — 78 items
ADH SKN CLS APL DERMABOND .7 (GAUZE/BANDAGES/DRESSINGS) ×4
AGENT HMST SPONGE THK3/8 (HEMOSTASIS) ×2
BAG DECANTER FOR FLEXI CONT (MISCELLANEOUS) ×4 IMPLANT
BANDAGE ELASTIC 4 VELCRO ST LF (GAUZE/BANDAGES/DRESSINGS) IMPLANT
BANDAGE ESMARK 6X9 LF (GAUZE/BANDAGES/DRESSINGS) IMPLANT
BIOPATCH RED 1 DISK 7.0 (GAUZE/BANDAGES/DRESSINGS) ×3 IMPLANT
BIOPATCH RED 1IN DISK 7.0MM (GAUZE/BANDAGES/DRESSINGS) ×1
BNDG CMPR 9X6 STRL LF SNTH (GAUZE/BANDAGES/DRESSINGS)
BNDG ESMARK 6X9 LF (GAUZE/BANDAGES/DRESSINGS)
CANISTER SUCTION 2500CC (MISCELLANEOUS) ×4 IMPLANT
CATH CANNON HEMO 15FR 23CM (HEMODIALYSIS SUPPLIES) ×2 IMPLANT
CATH PALINDROME RT-P 15FX19CM (CATHETERS) IMPLANT
CATH PALINDROME RT-P 15FX23CM (CATHETERS) IMPLANT
CATH PALINDROME RT-P 15FX28CM (CATHETERS) IMPLANT
CATH PALINDROME RT-P 15FX55CM (CATHETERS) ×2 IMPLANT
CATH STRAIGHT 5FR 65CM (CATHETERS) ×2 IMPLANT
COVER PROBE W GEL 5X96 (DRAPES) ×4 IMPLANT
CUFF TOURNIQUET SINGLE 18IN (TOURNIQUET CUFF) IMPLANT
CUFF TOURNIQUET SINGLE 24IN (TOURNIQUET CUFF) IMPLANT
CUFF TOURNIQUET SINGLE 34IN LL (TOURNIQUET CUFF) IMPLANT
CUFF TOURNIQUET SINGLE 44IN (TOURNIQUET CUFF) IMPLANT
DERMABOND ADVANCED (GAUZE/BANDAGES/DRESSINGS) ×4
DERMABOND ADVANCED .7 DNX12 (GAUZE/BANDAGES/DRESSINGS) IMPLANT
DRAIN CHANNEL 15F RND FF W/TCR (WOUND CARE) IMPLANT
DRAPE C-ARM 42X72 X-RAY (DRAPES) ×4 IMPLANT
DRAPE CHEST BREAST 15X10 FENES (DRAPES) ×6 IMPLANT
DRSG COVADERM 4X10 (GAUZE/BANDAGES/DRESSINGS) IMPLANT
DRSG COVADERM 4X8 (GAUZE/BANDAGES/DRESSINGS) IMPLANT
ELECT REM PT RETURN 9FT ADLT (ELECTROSURGICAL) ×4
ELECTRODE REM PT RTRN 9FT ADLT (ELECTROSURGICAL) ×2 IMPLANT
EVACUATOR SILICONE 100CC (DRAIN) IMPLANT
GAUZE SPONGE 2X2 8PLY STRL LF (GAUZE/BANDAGES/DRESSINGS) ×2 IMPLANT
GAUZE SPONGE 4X4 16PLY XRAY LF (GAUZE/BANDAGES/DRESSINGS) ×4 IMPLANT
GLOVE BIO SURGEON STRL SZ7 (GLOVE) ×4 IMPLANT
GLOVE BIOGEL PI IND STRL 6.5 (GLOVE) IMPLANT
GLOVE BIOGEL PI IND STRL 7.5 (GLOVE) ×2 IMPLANT
GLOVE BIOGEL PI INDICATOR 6.5 (GLOVE) ×4
GLOVE BIOGEL PI INDICATOR 7.5 (GLOVE) ×4
GLOVE SURG SS PI 7.5 STRL IVOR (GLOVE) ×3 IMPLANT
GOWN STRL REUS W/ TWL LRG LVL3 (GOWN DISPOSABLE) ×6 IMPLANT
GOWN STRL REUS W/TWL LRG LVL3 (GOWN DISPOSABLE) ×12
HEMOSTAT SPONGE AVITENE ULTRA (HEMOSTASIS) ×2 IMPLANT
KIT BASIN OR (CUSTOM PROCEDURE TRAY) ×4 IMPLANT
KIT ROOM TURNOVER OR (KITS) ×4 IMPLANT
LIQUID BAND (GAUZE/BANDAGES/DRESSINGS) ×2 IMPLANT
NDL 18GX1X1/2 (RX/OR ONLY) (NEEDLE) ×1 IMPLANT
NDL HYPO 25GX1X1/2 BEV (NEEDLE) ×2 IMPLANT
NEEDLE 18GX1X1/2 (RX/OR ONLY) (NEEDLE) ×4 IMPLANT
NEEDLE HYPO 25GX1X1/2 BEV (NEEDLE) ×4 IMPLANT
NS IRRIG 1000ML POUR BTL (IV SOLUTION) ×8 IMPLANT
PACK PERIPHERAL VASCULAR (CUSTOM PROCEDURE TRAY) ×4 IMPLANT
PACK SURGICAL SETUP 50X90 (CUSTOM PROCEDURE TRAY) ×4 IMPLANT
PAD ARMBOARD 7.5X6 YLW CONV (MISCELLANEOUS) ×8 IMPLANT
PADDING CAST COTTON 6X4 STRL (CAST SUPPLIES) IMPLANT
SET MICROPUNCTURE 5F STIFF (MISCELLANEOUS) ×3 IMPLANT
SOAP 2 % CHG 4 OZ (WOUND CARE) ×4 IMPLANT
SPONGE GAUZE 2X2 STER 10/PKG (GAUZE/BANDAGES/DRESSINGS) ×2
SPONGE GAUZE 4X4 12PLY STER LF (GAUZE/BANDAGES/DRESSINGS) ×4 IMPLANT
SPONGE SURGIFOAM ABS GEL 100 (HEMOSTASIS) IMPLANT
STAPLER VISISTAT 35W (STAPLE) ×2 IMPLANT
SUT ETHILON 3 0 PS 1 (SUTURE) ×4 IMPLANT
SUT MNCRL AB 4-0 PS2 18 (SUTURE) ×4 IMPLANT
SUT PROLENE 5 0 C 1 24 (SUTURE) IMPLANT
SUT PROLENE 6 0 BV (SUTURE) ×2 IMPLANT
SUT VIC AB 2-0 CT1 27 (SUTURE)
SUT VIC AB 2-0 CT1 TAPERPNT 27 (SUTURE) ×2 IMPLANT
SUT VIC AB 3-0 SH 27 (SUTURE) ×4
SUT VIC AB 3-0 SH 27X BRD (SUTURE) ×2 IMPLANT
SYR 20CC LL (SYRINGE) ×8 IMPLANT
SYR 3ML LL SCALE MARK (SYRINGE) ×4 IMPLANT
SYR 5ML LL (SYRINGE) ×4 IMPLANT
SYR CONTROL 10ML LL (SYRINGE) ×4 IMPLANT
SYRINGE 10CC LL (SYRINGE) ×4 IMPLANT
TAPE CLOTH SURG 4X10 WHT LF (GAUZE/BANDAGES/DRESSINGS) ×6 IMPLANT
TRAY FOLEY W/METER SILVER 16FR (SET/KITS/TRAYS/PACK) ×1 IMPLANT
UNDERPAD 30X30 INCONTINENT (UNDERPADS AND DIAPERS) ×4 IMPLANT
WATER STERILE IRR 1000ML POUR (IV SOLUTION) ×4 IMPLANT
WIRE AMPLATZ SS-J .035X180CM (WIRE) ×2 IMPLANT

## 2015-05-30 NOTE — Anesthesia Preprocedure Evaluation (Addendum)
Anesthesia Evaluation  Patient identified by MRN, date of birth, ID band Patient awake    Reviewed: Allergy & Precautions, NPO status , Patient's Chart, lab work & pertinent test results  Airway Mallampati: II  TM Distance: >3 FB Neck ROM: Full    Dental no notable dental hx.    Pulmonary COPD, former smoker,    Pulmonary exam normal breath sounds clear to auscultation       Cardiovascular hypertension, + CAD, + Past MI, + Peripheral Vascular Disease and +CHF  Normal cardiovascular exam Rhythm:Regular Rate:Normal     Neuro/Psych CVA negative psych ROS   GI/Hepatic negative GI ROS, Neg liver ROS,   Endo/Other  negative endocrine ROS  Renal/GU negative Renal ROS  negative genitourinary   Musculoskeletal negative musculoskeletal ROS (+)   Abdominal   Peds negative pediatric ROS (+)  Hematology negative hematology ROS (+)   Anesthesia Other Findings   Reproductive/Obstetrics negative OB ROS                            Anesthesia Physical Anesthesia Plan  ASA: IV  Anesthesia Plan: General   Post-op Pain Management:    Induction: Intravenous  Airway Management Planned: LMA  Additional Equipment:   Intra-op Plan:   Post-operative Plan: Extubation in OR  Informed Consent: I have reviewed the patients History and Physical, chart, labs and discussed the procedure including the risks, benefits and alternatives for the proposed anesthesia with the patient or authorized representative who has indicated his/her understanding and acceptance.   Dental advisory given  Plan Discussed with: CRNA and Surgeon  Anesthesia Plan Comments:        Anesthesia Quick Evaluation

## 2015-05-30 NOTE — H&P (View-Only) (Signed)
    Postoperative Access Visit   History of Present Illness  [name] is a 70 y.o. year old male who presents for postoperative follow-up for: LIJ TDC exchange (Date: 05/14/15).  The patient was seen in ED with bleeding from prior [facility] exit site.  This patient was fine with minimal neck swelling until last Friday when he developed a swelling over the [facility] insertion site.  He then went to the ED to be evaluated.  For VQI Use Only  PRE-ADM LIVING: Home  AMB STATUS: Ambulatory  Physical Examination Filed Vitals:   05/28/15 1436  BP: 131/85  Pulse: 90  Temp: 98.5 F (36.9 C)  Resp: 16    L neck: prior exit site next to neck cannulation incision has moderate sized hematoma decompressing through the exit site, organize thrombus removed and some of tunnel hematoma evacuated manually  Medical Decision Making  [name] is a 70 y.o. year old male who presents s/p LIJ TDC exchange .  Some of this patient's bleeding is medical bleeding.  He needs to have a complete HD session to help with uremic platelet failure.  Continue abx given by ED  Warm compress to [facility] exit site to facilitate drainage.   Need to facilitate evacuation of hematoma completed.  Home health wound care: pack exit site with Iodoform gauze.  Follow up wound check tomorrow.  Thank you for allowing [place] to participate in this patient's care.  Adele Barthel, MD [facility] [place] Office: [phone] Pager: [phone]  05/28/2015, 3:23 PM

## 2015-05-30 NOTE — Anesthesia Postprocedure Evaluation (Signed)
Anesthesia Post Note  Patient: [name]  Procedure(s) Performed: Procedure(s) (LRB): REMOVAL OF A DIALYSIS CATHETER LEFT INTERNAL JUGULAR (Left) INSERTION OF DIALYSIS CATHETER (Right) EVACUATION NECK HEMATOMA (Left)  Patient location during evaluation: PACU Anesthesia Type: General Level of consciousness: awake and alert Pain management: pain level controlled Vital Signs Assessment: post-procedure vital signs reviewed and stable Respiratory status: spontaneous breathing, nonlabored ventilation, respiratory function stable and patient connected to nasal cannula oxygen Cardiovascular status: blood pressure returned to baseline and stable Postop Assessment: no signs of nausea or vomiting Anesthetic complications: no    Last Vitals:  Filed Vitals:   05/30/15 1102 05/30/15 1117  BP: 135/84 152/88  Pulse:  79  Temp:    Resp: 12 12    Last Pain: There were no vitals filed for this visit.               Reymundo Winship S

## 2015-05-30 NOTE — Interval H&P Note (Signed)
[facility] [place]  History and Physical Update  The patient was interviewed and re-examined.  The patient's previous History and Physical has been reviewed and is unchanged from my prior H&P except interval bleeding from prior [facility] exit site.  The patient continues to bleed, so I recommend: removal of the [facility] and evacuation hematoma at the neck cannulation site, placement of new tunneled dialysis catheter.   The patient is aware the risks of tunneled dialysis catheter placement include but are not limited to: bleeding, infection, central venous injury, pneumothorax, possible venous stenosis, possible malpositioning in the venous system, and possible infections related to long-term catheter presence.  The patient was aware of these risks and agreed to proceed.   Adele Barthel, MD [facility] [place] Office: [phone] Pager: [phone]  05/30/2015, 7:23 AM

## 2015-05-30 NOTE — Op Note (Signed)
OPERATIVE NOTE  PROCEDURE: 1. Bilateral femoral vein tunneled dialysis catheter placement 2. Right femoral vein cannulation under ultrasound guidance 3. Evacuation of neck hematoma 4. Right internal jugular vein cannulation  PRE-OPERATIVE DIAGNOSIS: end-stage renal failure  POST-OPERATIVE DIAGNOSIS: same as above  SURGEON: Adele Barthel, MD  ANESTHESIA: general  ESTIMATED BLOOD LOSS: 30 cc  FINDING(S): 1.  Tips of the catheter in the right atrium on fluoroscopy 2.  Inability to past the proximal right innominate vein with microwire, suggesting occlusion or stenosis  SPECIMEN(S):  none  INDICATIONS:   [name] is a 70 y.o. male who  presents with end stage renal disease.  The patient presents for tunneled dialysis catheter placement.  The patient is aware the risks of tunneled dialysis catheter placement include but are not limited to: bleeding, infection, central venous injury, pneumothorax, possible venous stenosis, possible malpositioning in the venous system, and possible infections related to long-term catheter presence.  The patient was aware of these risks and agreed to proceed.  DESCRIPTION: After written full informed consent was obtained from the patient, the patient was taken back to the operating room.  Prior to induction, the patient was given IV antibiotics.  I transected the exit site sutures and released the tunneled dialysis catheter.  I removed the tunneled dialysis catheter without difficulty and held pressure to the neck cannulation site for 3 minutes.  There was no active bleeding.  The patient was prepped and draped for a neck or chest tunneled dialysis catheter placement.  Under Sonosite guidance, I cannulated the right internal jugular vein with a micropuncture needle.  The microwire would only pass ~5 cm past the needle and then curled up in the right innominate vein on fluoroscopy.    I removed the wire and needle and held pressure for 5 minutes. The  bleeding stopped.   At this point, I turned my attention to the large left neck hematoma.  I made an incision over the neck cannulation site.  Immediately, it became apparent that there was no bleeding here and minimal hematoma, thus the active bleeding was in the prior exit site created during a previous tunneled dialysis catheter placement.  I extended the incision longitundially through the exit site.  Using electrocautery, I dissected down to the hematoma cavity in the exit site.  I evacuated ~30 cc of hematoma.  There was no arterial bleeding but diffuse ooze.  I controlled obvious bleeder with with electrocautery.  I packed the cannulate and exit sites with Avitene.  I held pressure for a a few minutes.  I also packed the new exit site with Avitene.  After a few minutes, the bleeding stopped from the new exit site.  There was no bleeding in the cannulation site and there was greatly reduced ooze in the prior exit site.  I controlled a few bleeding points with electrocautery.  I packed this wound with Avitene again.  After a waiting a few more minutes, no further bleeding was present.  I washed out the cannulation and prior exit site with sterile saline.  I reapproximated the subcutaneous tissue with 3-0 Vicryl.  The skin was reapproximated with staples.  I applied a pressure dressing to this incision.  Sterile dressing were applied to this incision and the new exit site.  The drapes were takened down.  The patient was reprepped and redraped in the standard fashion for a femoral vein tunneled dialysis catheter placement.  Under ultrasound guidance, the left femoral vein was cannulated  with the 18 gauge needle.  A J-wire was then placed into the inferior vena cava under fluroscopic guidance.  The wire was then secured in place with a clamp to the drapes.  I then made stab incisions at the cannulation and exit sites.  I dissected from the exit site to the cannulation site with a metal dissected and dilated  the subcutaneous tunnel with a plastic dilator.  The wire was then unclamped and I removed the needle.  I tried to dilate the skin with the dilators but I encounter dense scar tissue.  Using the microsheath, I exchanged the wire for an Amplatz wire.  Even with this stiff wire, I could NOT dilate the subcutaneous tissue.  Subsequently, I elected to abort the procedure on this side.  I removed the wire and sheath and held pressure for 5 minutes.  Under ultrasound guidance, the right emoral vein was cannulated with the 18 gauge needle.  A J-wire was then placed into the inferior vena cava under fluroscopic guidance.  The wire was then secured in place with a clamp to the drapes.  I then made stab incisions at the cannulation and exit sites.  I dissected from the exit site to the cannulation site with a metal dissected and dilated the subcutaneous tunnel with a plastic dilator.  The wire was then unclamped and I removed the needle.  The skin tract and venotomy was dilated serially with dilators.  Finally, the dilator-sheath was placed under fluroscopic guidance into the right iliac vein.  The dilator and wire were removed.  A 55 cm Palindrome catheter was placed under fluoroscopic guidance into the right external iliac vein.  I would not track into inferior vena cava, catch on a valve or branch.  I loaded the J-wire through the catheter and selected the common iliac vein.  I was able to rotate the catheter and eventually direct it into the atrium.  The wire was removed.  The sheath was broken and peeled away while holding the catheter cuff at the level of the skin.  The back of the Palindrome was connected to the metal dilator and delivered through the subcutaneous tunnel.  The back end of this catheter was transected, revealing the two lumens of this catheter.  The ports were docked onto these two lumens.  The catheter collar was then clicked into place.  Each port was tested by aspirating and flushing.  No  resistance was noted.  Each port was then thoroughly flushed with heparinized saline.  The catheter was secured in placed with two interrupted stitches of 3-0 Nylon tied to the catheter.  The cannulation incision was closed with a U-stitch of 4-0 Monocryl.  The cannulation and exit incisions were cleaned and sterile bandages applied.  Each port was then loaded with concentrated heparin (1000 Units/mL) at the manufacturer recommended volumes to each port.  Sterile caps were applied to each port.  On completion fluoroscopy, the tips of the catheter were in the right atrium, and there was no evidence of pneumothorax.  I repaired the two stab incisions in left leg with 4-0 Monocryl U-stitches.  The skin was cleaned, dried, and reinforce with Dermabond on this side.   COMPLICATIONS: none  CONDITION: stable   Adele Barthel, MD [facility] [place] Office: [phone] Pager: [phone]  05/30/2015, 10:52 AM

## 2015-05-30 NOTE — Transfer of Care (Signed)
Immediate Anesthesia Transfer of Care Note  Patient: [name]  Procedure(s) Performed: Procedure(s): REMOVAL OF A DIALYSIS CATHETER LEFT INTERNAL JUGULAR (Left) INSERTION OF DIALYSIS CATHETER (Right) EVACUATION NECK HEMATOMA (Left)  Patient Location: PACU  Anesthesia Type:General  Level of Consciousness: awake and alert   Airway & Oxygen Therapy: Patient Spontanous Breathing and Patient connected to nasal cannula oxygen  Post-op Assessment: Report given to RN and Post -op Vital signs reviewed and stable  Post vital signs: Reviewed and stable  Last Vitals:  Filed Vitals:   05/30/15 0811  BP: 173/91  Pulse: 54  Temp: 36.8 C  Resp: 18    Complications: No apparent anesthesia complications

## 2015-05-30 NOTE — Anesthesia Procedure Notes (Signed)
Procedure Name: Intubation Date/Time: 05/30/2015 9:18 AM Performed by: Eligha Bridegroom Pre-anesthesia Checklist: Emergency Drugs available, Patient identified, Timeout performed, Suction available and Patient being monitored Patient Re-evaluated:Patient Re-evaluated prior to inductionOxygen Delivery Method: Circle system utilized Preoxygenation: Pre-oxygenation with 100% oxygen Intubation Type: IV induction Tube size: 7.5 mm Number of attempts: 1 Secured at: 22 cm Tube secured with: Tape Dental Injury: Teeth and Oropharynx as per pre-operative assessment

## 2015-06-01 ENCOUNTER — Ambulatory Visit: Payer: Medicare Other | Admitting: Family

## 2015-06-01 ENCOUNTER — Encounter: Payer: Self-pay | Admitting: Family

## 2015-06-01 ENCOUNTER — Ambulatory Visit (INDEPENDENT_AMBULATORY_CARE_PROVIDER_SITE_OTHER): Payer: Medicare Other | Admitting: Family

## 2015-06-01 ENCOUNTER — Ambulatory Visit (HOSPITAL_COMMUNITY)
Admission: RE | Admit: 2015-06-01 | Discharge: 2015-06-01 | Disposition: A | Discharge status: Home / Self Care | Payer: Medicare Other | Source: Ambulatory Visit | Attending: Family | Admitting: Family

## 2015-06-01 ENCOUNTER — Encounter (HOSPITAL_COMMUNITY): Payer: Medicare Other

## 2015-06-01 ENCOUNTER — Other Ambulatory Visit: Payer: Self-pay | Admitting: *Deleted

## 2015-06-01 VITALS — BP 167/87 | HR 66 | Temp 97.6°F | Resp 14 | Ht 75.0 in | Wt 160.0 lb

## 2015-06-01 DIAGNOSIS — Z95828 Presence of other vascular implants and grafts: Secondary | ICD-10-CM | POA: Diagnosis present

## 2015-06-01 DIAGNOSIS — Z87891 Personal history of nicotine dependence: Secondary | ICD-10-CM

## 2015-06-01 DIAGNOSIS — I723 Aneurysm of iliac artery: Secondary | ICD-10-CM

## 2015-06-01 DIAGNOSIS — N186 End stage renal disease: Secondary | ICD-10-CM

## 2015-06-01 DIAGNOSIS — I132 Hypertensive heart and chronic kidney disease with heart failure and with stage 5 chronic kidney disease, or end stage renal disease: Secondary | ICD-10-CM | POA: Insufficient documentation

## 2015-06-01 DIAGNOSIS — Z992 Dependence on renal dialysis: Secondary | ICD-10-CM

## 2015-06-01 DIAGNOSIS — I714 Abdominal aortic aneurysm, without rupture, unspecified: Secondary | ICD-10-CM

## 2015-06-01 DIAGNOSIS — I770 Arteriovenous fistula, acquired: Secondary | ICD-10-CM

## 2015-06-01 DIAGNOSIS — I5022 Chronic systolic (congestive) heart failure: Secondary | ICD-10-CM | POA: Insufficient documentation

## 2015-06-01 DIAGNOSIS — Z01810 Encounter for preprocedural cardiovascular examination: Secondary | ICD-10-CM

## 2015-06-01 DIAGNOSIS — T82898A Other specified complication of vascular prosthetic devices, implants and grafts, initial encounter: Secondary | ICD-10-CM | POA: Diagnosis not present

## 2015-06-01 DIAGNOSIS — Z48812 Encounter for surgical aftercare following surgery on the circulatory system: Secondary | ICD-10-CM

## 2015-06-01 DIAGNOSIS — E785 Hyperlipidemia, unspecified: Secondary | ICD-10-CM | POA: Diagnosis not present

## 2015-06-01 NOTE — Progress Notes (Signed)
[facility] [place]    CC: Follow up s/p EVAR  History of Present Illness [name] is a 70 y.o. male patient of Dr. [name] who is s/p EVAR (01/16/2008 using a Cook Zenith device). He also has a left upper arm AV fistula that was placed 07/07/14 by [name]. Wife states she took pt to the ED at [facility] in August 2016 due to bleeding from left upper arm AVF; the AVF has been used since July 2016, wife states the temporary HD catheter on the right side of his chest was removed in August or September 2016.  On 01/15/15 Dr. [name] performed a fistula gram with the following findings: 1. Patent fistula with proximal stenosis (50%) with distal swing-segment stenosis (50-75%): <30% residual in proximal 75%, distal fistula stenosis non-responsive to venoplasty 2. Widely patent anastomosis 3. Patent central venous structures   The patient denies steal symptoms in his left hand.   The patient has no back or abdominal pain. He denies claudication pain in legs, denies non-healing wounds.  He had an MI in July 2013 and completed cardiac rehabilitation. A coronary stent was placed. He reports several strokes and does not know when his last stroke was; manifested as some degree of loss of vision and trouble speaking, denies hemiplegia. He states he quit ETOH use in 2015, states he stopped that for now, documented 28 shots of liquor/week in the past. He reports feeling off balance at times, the reason he uses walker. Noted that carotid Duplex done January, 2014 shows <40% bilateral ICA stenosis.  [name] follows pt for history of strokes; wife states pt had a series of micro bleeds in his brain in March of 2016.  He takes a daily statin and ASA, no other antiplatelet nor anticoagulant medications.    Pt Diabetic: No Pt smoker: former smoker (3/4 ppd x 50+ yrs, quit December 2015)  Dr. [name] reviewed pt's CT angiogram at pt's February 2015 visit. This  showed a stable aneurysm sac. Diameter measurements were 4.1 x 2.8. There was no evidence of endoleak. There had been a slight increase in the size of the right common iliac artery. This had increased from 2.3 up to 2.6 when compared to his study in June of 2010 No evidence of complicating features on CT scan.Will need to pay attention to the dilated right common iliac artery on future studies.  Dr. [name] scheduled the patient to come back in one year with an abdominal ultrasound.   Pt is also s/p LIJ TDC exchange (Date: 05/14/15). The patient was seen in ED with bleeding from prior [facility] exit site. This patient was fine with minimal neck swelling until last Friday when he developed a swelling over the [facility] insertion site. He then went to the ED to be evaluated   Pt meds include: Statin :Yes Betablocker: No ASA: Yes Other anticoagulants/antiplatelets: no  Past Medical History  Diagnosis Date  . Hypertension   . COPD (chronic obstructive pulmonary disease) ([place])   . Hyperlipidemia   . Cardiomyopathy     EF of 30% per echo in June of 2012; admitted with congestive heart failure; nonobstructive CAD on cath in 08/2010  . History of noncompliance with medical treatment   . Cerebrovascular disease 2011    CVA  in 02/2010-only deficit is decreased vision in  right eye  . Alcohol abuse     wife states he was never heavy drinker  . Tobacco abuse  40 pack years  . Abdominal aortic aneurysm ([place])     Stent graft repair  . Chronic systolic CHF (congestive heart failure) ([place])   . Leg pain   . CKD (chronic kidney disease) stage 3, GFR 30-59 ml/min     creatinine-1.7 in 08/2010  . CAD (coronary artery disease)   . Myocardial infarction acute ([place]) 10/01/11  . CKD (chronic kidney disease), stage III 12/06/2012  . Effusion of right knee joint 12/06/2012  . Anemia   . Atrial fibrillation ([place])   . Ataxic gait   . Weakness generalized   . Frequent falls   . Forgetfulness   . Stroke  [facility]) 2011    decreased vision of right eye    Most recent 05/23/14 - bleed - has a drag to his left leg  . Depression   . Anxiety     Takes Citalopram  . Arthritis     knees  . Renal insufficiency   . Shortness of breath dyspnea     sometimes after dialysis  . Dementia     early onset  . Enlarged prostate   . GERD (gastroesophageal reflux disease)   . Constipation     Social History Social History  Substance Use Topics  . Smoking status: Former Smoker -- 0.50 packs/day for 50 years    Types: Cigarettes    Start date: 06/25/1957    Quit date: 03/04/2013  . Smokeless tobacco: Never Used  . Alcohol Use: No     Comment: None for several years    Family History Family History  Problem Relation Age of Onset  . Colon cancer Neg Hx   . Anesthesia problems Neg Hx   . Hypotension Neg Hx   . Malignant hyperthermia Neg Hx   . Pseudochol deficiency Neg Hx   . Cancer Mother     Gall Bladder  . Heart disease Mother     before age 66  . Hyperlipidemia Mother   . Hypertension Mother   . Asthma Mother   . Cancer Sister     Cervical    Past Surgical History  Procedure Laterality Date  . Colonoscopy w/ polypectomy  2006    Dr. Tamala Julian: anal fissure, sessile adenomatous polyp, diverticulosis  . Esophagogastroduodenoscopy  11/15/2010    Procedure: ESOPHAGOGASTRODUODENOSCOPY (EGD);  Surgeon: Dorothyann Peng, MD;  Location: [facility];  Service: Endoscopy;  Laterality: N/A;  with propofol sedation; procedure start @ 0813  . Flexible sigmoidoscopy  11/15/2010    Procedure: FLEXIBLE SIGMOIDOSCOPY;  Surgeon: Dorothyann Peng, MD;  Location: [facility];  Service: Endoscopy;  Laterality: N/A;  with propofol sedation; ended @ 0808  . Polypectomy  12/13/2010    Procedure: POLYPECTOMY;  Surgeon: Dorothyann Peng, MD;  Location: [facility];  Service: Endoscopy;  Laterality: N/A;  right colon, cecal, transverse, and sigmoid polypectomy  . Cardiac catheterization  10/04/11    Normal left main, 95% pLAD stenosis  with ulceration s/p successful BMS placement, 30% segmental plaque beyond this, dLAD after D2 with 50% plaquing, then focal 70% lesion, 80% focal short D2 stenosis, 30% pOM2 lesion, 30-40% mOM3 stenosis, Shephard's crook region of RCA with 50% lesion, mRCA 50-60% lesion and mild dRCA irregularities.   . Coronary stent placement  10/04/11  . Abdominal aortic aneurysm repair w/ endoluminal graft      01/16/2008  . Left heart catheterization with coronary angiogram N/A 10/04/2011    Procedure: LEFT HEART CATHETERIZATION WITH CORONARY ANGIOGRAM;  Surgeon: Hillary Bow, MD;  Location: [place] CATH LAB;  Service: Cardiovascular;  Laterality: N/A;  . Av fistula placement Left 07/07/2014    Procedure: Creation of Left Arm ARTERIOVENOUS Fistula;  Surgeon: Rosetta Posner, MD;  Location: [place];  Service: Vascular;  Laterality: Left;  . Insertion of dialysis catheter Right 08/05/2014    Procedure: INSERTION OF DIALYSIS CATHETER Right internal Jugular;  Surgeon: Conrad Blennerhassett, MD;  Location: [place];  Service: Vascular;  Laterality: Right;  . Port-a-cath removal    . Peripheral vascular catheterization Left 01/15/2015    Procedure: Fistulagram;  Surgeon: Conrad Candelaria Arenas, MD;  Location: [place] CV LAB;  Service: Cardiovascular;  Laterality: Left;  arm  . Peripheral vascular catheterization Left 01/15/2015    Procedure: Peripheral Vascular Intervention;  Surgeon: Conrad Searcy, MD;  Location: [place] CV LAB;  Service: Cardiovascular;  Laterality: Left;  ARM VEINOUS PTA  . Insertion of dialysis catheter Left 02/13/2015    Procedure: INSERTION OF DIALYSIS CATHETER AND CENTRAL VENOGRAM;  Surgeon: Serafina Mitchell, MD;  Location: [place];  Service: Vascular;  Laterality: Left;  . Peripheral vascular catheterization N/A 04/07/2015    Procedure: A/V Shuntogram/Fistulagram;  Surgeon: Conrad Leesburg, MD;  Location: [place] CV LAB;  Service: Cardiovascular;  Laterality: N/A;  . Revison of arteriovenous fistula Left 04/09/2015     Procedure: PLICATION OF LEFT ARM PSEUDOANEURYSM;  Surgeon: Conrad Cypress Quarters, MD;  Location: [place];  Service: Vascular;  Laterality: Left;  . Patch angioplasty Left 04/09/2015    Procedure: PATCH ANGIOPLASTY;  Surgeon: Conrad Britt, MD;  Location: [place];  Service: Vascular;  Laterality: Left;  . Ligation of arteriovenous  fistula Left 04/15/2015    Procedure: LIGATION OF BRACHIOCEPHALIC ARTERIOVENOUS  FISTULA;  Surgeon: Mal Misty, MD;  Location: [place];  Service: Vascular;  Laterality: Left;  . Exchange of a dialysis catheter Left 05/14/2015    Procedure: EXCHANGE OF A DIALYSIS CATHETER-LEFT INTERNAL JUGULAR VEIN;  Surgeon: Conrad , MD;  Location: [place];  Service: Vascular;  Laterality: Left;    No Known Allergies  Current Outpatient Prescriptions  Medication Sig Dispense Refill  . amLODipine (NORVASC) 5 MG tablet Take 1 tablet (5 mg total) by mouth daily. (Patient taking differently: Take 5 mg by mouth every evening. ) 30 tablet 6  . aspirin EC 81 MG tablet Take 81 mg by mouth every evening.     Marland Kitchen atorvastatin (LIPITOR) 80 MG tablet TAKE 1 TABLET (80 MG TOTAL) BY MOUTH DAILY AT 6 PM. 90 tablet 3  . b complex-vitamin c-folic acid (NEPHRO-VITE) 0.8 MG TABS tablet Take 1 tablet by mouth daily.  11  . cephALEXin (KEFLEX) 250 MG capsule Take 1 capsule (250 mg total) by mouth daily. 7 capsule 0  . citalopram (CELEXA) 10 MG tablet TAKE 1 TABLET BY MOUTH EVERY DAY (Patient taking differently: TAKE 1 TABLET BY MOUTH EVERY evening) 30 tablet 2  . ezetimibe (ZETIA) 10 MG tablet Take 1 tablet by mouth daily.  5  . naproxen sodium (ANAPROX) 220 MG tablet Take 440 mg by mouth daily as needed (pain). Reported on 05/27/2015    . oxyCODONE-acetaminophen (PERCOCET/ROXICET) 5-325 MG tablet Take 1 tablet by mouth every 6 (six) hours as needed. 30 tablet 0  . pantoprazole (PROTONIX) 40 MG tablet TAKE 1 TABLET (40 MG TOTAL) BY MOUTH DAILY. (Patient taking differently: TAKE 1 TABLET (40 MG TOTAL) BY MOUTH DAILY. -  every evening) 30 tablet 11  . polyethylene glycol (MIRALAX /  GLYCOLAX) packet Take 17 g by mouth daily. 14 each 0  . [DISCONTINUED] metoprolol (TOPROL-XL) 50 MG 24 hr tablet Take 50 mg by mouth daily.      . [DISCONTINUED] niacin (NIASPAN) 500 MG CR tablet Take 500 mg by mouth at bedtime.      . [DISCONTINUED] omeprazole (PRILOSEC) 20 MG capsule 1 po every morning 30 capsule 5   No current facility-administered medications for this visit.    ROS: See HPI for pertinent positives and negatives.   Physical Examination  Filed Vitals:   06/01/15 1032 06/01/15 1045  BP: 181/93 167/87  Pulse: 66 66  Temp: 97.6 F (36.4 C)   TempSrc: Oral   Resp: 14   Height: 6\' 3"  (1.905 m)   Weight: 160 lb (72.576 kg)   SpO2: 98%    Body mass index is 20 kg/(m^2).   General: A&O x 3, WD, using walker.  Pulmonary: Sym exp, good air movt, CTAB, no rales, rhonchi, & wheezing.   Cardiac: RRR, Nl S1, S2, no detected murmur.  Vascular: Vessel Right Left  Radial Palpable Palpable  Carotid Audible without bruit Audiblewithout bruit  Aorta Not palpable N/A  Femoral Palpable Palpable  Popliteal Not palpable Not palpable  PT notPalpable Not Palpable  DP notPalpable notPalpable   Gastrointestinal: soft, NTND, -G/R, - HSM, - palpable masses, - CVAT B.  Musculoskeletal: M/S 5/5 in UE's, 5/5 in LE's, Extremities without ischemic changes.  Neurologic: Pain and light touch intact in extremities, Motor exam as listed above.  CN 2-12 intact.                     Non-Invasive Vascular Imaging: DATE: 06/01/2015 ABDOMINAL AORTA DUPLEX EVALUATION - POST ENDOVASCULAR REPAIR    INDICATION: Follow up Abdominal aortic repair    PREVIOUS INTERVENTION(S): EVAR 01/16/2008    DUPLEX EXAM:      DIAMETER AP (cm) DIAMETER TRANSVERSE (cm) VELOCITIES (cm/sec)  Aorta 3.1 3.3 153  Right Common Iliac 2.2 2.2 89  Left Common Iliac  1.9 1.0 97    Comparison Study       Date DIAMETER AP (cm) DIAMETER TRANSVERSE (cm)  04/02/2012 3.2 3.2     ADDITIONAL FINDINGS:     IMPRESSION: 1. Maximum residual sac size measures 3.1 x 3.3 cms. 2. The right limb appears dilated and measures 2.2 cms x 2.2 cms,  3. The proximal aorta measures 3.8 cm AP, limited visualization 4.  The iliac arteries were difficult to visualized due to bowel gas    Compared to the previous exam:  No significant change in sac size      ASSESSMENT: [name] is a 70 y.o. male who presents s/p EVAR (Date: 01/16/08). Pt is asymptomatic with stable sac size.  Maximum residual sac size measures 3.1 x 3.3 cms. The right limb appears dilated and measures 2.2 cms x 2.2 cms,  The proximal aorta measures 3.8 cm AP, limited visualization The iliac arteries were difficult to visualized due to bowel gas  Wife states pt will use this tunneled HD catheter for a while; states Dr. [name] told her and pt that vein mapping will be done on his right arm at some point to evaluate for permanent HD access site. Wife states eventually pt is contemplating Peritoneal Dialysis.     PLAN:  Based on the patient's vascular studies and examination, pt will return to clinic in 1 year with EVAR duplex.  Follow up with Dr. [name] in 2-3 weeks to  discuss next HD site option. I discussed in depth with the patient the nature of atherosclerosis, and emphasized the importance of maximal medical management including strict control of blood pressure, blood glucose, and lipid levels, obtaining regular exercise, and continued cessation of smoking.  The patient is aware that without maximal medical management the underlying atherosclerotic disease process will progress, limiting the benefit of any interventions.   Clemon Chambers, RN, MSN, FNP-C [facility] Phone: [phone]  Clinic MD: [name]  06/01/2015 9:28 AM

## 2015-06-02 ENCOUNTER — Telehealth: Payer: Self-pay | Admitting: Vascular Surgery

## 2015-06-02 ENCOUNTER — Encounter: Payer: Self-pay | Admitting: Family Medicine

## 2015-06-02 ENCOUNTER — Ambulatory Visit (INDEPENDENT_AMBULATORY_CARE_PROVIDER_SITE_OTHER): Payer: Medicare Other | Admitting: Family Medicine

## 2015-06-02 VITALS — BP 144/80 | Ht 75.0 in | Wt 158.1 lb

## 2015-06-02 DIAGNOSIS — N185 Chronic kidney disease, stage 5: Secondary | ICD-10-CM | POA: Diagnosis not present

## 2015-06-02 DIAGNOSIS — R0600 Dyspnea, unspecified: Secondary | ICD-10-CM | POA: Diagnosis not present

## 2015-06-02 NOTE — Telephone Encounter (Signed)
-----   Message from Mena Goes, RN sent at 06/01/2015  9:39 AM EDT ----- Regarding: schedule   ----- Message -----    From: Ulyses Amor, PA-C    Sent: 05/30/2015  11:01 AM      To: V[facility] Charge Pool  S/P femoral catheter placement and irrigation of neck hematoma.  Needs bilateral UE vein mapping at next appointment in 2 weeks with Dr. [name]

## 2015-06-02 NOTE — Telephone Encounter (Signed)
Spoke with pts daughter to schedule, dpm °

## 2015-06-02 NOTE — Progress Notes (Signed)
   Subjective:    Patient ID: [name], male    DOB: 12-Nov-1945, 70 y.o.   MRN: [id number]  Hyperlipidemia This is a chronic problem. The current episode started more than 1 year ago. There are no compliance problems.    Patient has concerns of fatigue,  excessive sleepiness and SOB. Onset of symptoms 5 weeks ago.  patient finds himself getting fatigued and tired easily falls asleep sometimes during the day he states he sleeps well at night. Does not do any walking does not do any exercise because of his renal failure.  Review of Systems     He does have a little bit of shortness of breath see above denies any chest pain coughing up of phlegm no rectal bleeding no vomiting or fevers Objective:   Physical Exam   lungs are clear hearts regular abdomen soft extremities no edema skin warm dry      Assessment & Plan:   patient with severe vascular issues. This is causing significant stress fatigue tiredness is not able to exercise. I believe the patient is dealing with some significant issues of deconditioning cause fatigue tiredness I doubt sleep apnea I don't feel sleep study would be of any benefit recheck patient in 3 months

## 2015-06-03 ENCOUNTER — Other Ambulatory Visit: Payer: Self-pay | Admitting: Vascular Surgery

## 2015-06-03 ENCOUNTER — Other Ambulatory Visit: Payer: Self-pay | Admitting: *Deleted

## 2015-06-03 DIAGNOSIS — Z01810 Encounter for preprocedural cardiovascular examination: Secondary | ICD-10-CM

## 2015-06-03 DIAGNOSIS — N186 End stage renal disease: Secondary | ICD-10-CM

## 2015-06-04 ENCOUNTER — Ambulatory Visit (INDEPENDENT_AMBULATORY_CARE_PROVIDER_SITE_OTHER)
Admission: RE | Admit: 2015-06-04 | Discharge: 2015-06-04 | Disposition: A | Discharge status: Home / Self Care | Payer: Medicare Other | Source: Ambulatory Visit | Attending: Vascular Surgery | Admitting: Vascular Surgery

## 2015-06-04 ENCOUNTER — Ambulatory Visit (HOSPITAL_COMMUNITY)
Admission: RE | Admit: 2015-06-04 | Discharge: 2015-06-04 | Disposition: A | Discharge status: Home / Self Care | Payer: Medicare Other | Source: Ambulatory Visit | Attending: Vascular Surgery | Admitting: Vascular Surgery

## 2015-06-04 DIAGNOSIS — I5022 Chronic systolic (congestive) heart failure: Secondary | ICD-10-CM | POA: Diagnosis not present

## 2015-06-04 DIAGNOSIS — I429 Cardiomyopathy, unspecified: Secondary | ICD-10-CM | POA: Diagnosis not present

## 2015-06-04 DIAGNOSIS — R938 Abnormal findings on diagnostic imaging of other specified body structures: Secondary | ICD-10-CM | POA: Diagnosis not present

## 2015-06-04 DIAGNOSIS — I2510 Atherosclerotic heart disease of native coronary artery without angina pectoris: Secondary | ICD-10-CM | POA: Insufficient documentation

## 2015-06-04 DIAGNOSIS — N186 End stage renal disease: Secondary | ICD-10-CM | POA: Diagnosis not present

## 2015-06-04 DIAGNOSIS — Z01810 Encounter for preprocedural cardiovascular examination: Secondary | ICD-10-CM | POA: Diagnosis not present

## 2015-06-04 DIAGNOSIS — K219 Gastro-esophageal reflux disease without esophagitis: Secondary | ICD-10-CM | POA: Diagnosis not present

## 2015-06-04 DIAGNOSIS — I132 Hypertensive heart and chronic kidney disease with heart failure and with stage 5 chronic kidney disease, or end stage renal disease: Secondary | ICD-10-CM | POA: Diagnosis not present

## 2015-06-04 DIAGNOSIS — E785 Hyperlipidemia, unspecified: Secondary | ICD-10-CM | POA: Diagnosis not present

## 2015-06-04 DIAGNOSIS — Z720 Tobacco use: Secondary | ICD-10-CM | POA: Insufficient documentation

## 2015-06-11 ENCOUNTER — Encounter: Payer: Self-pay | Admitting: Family

## 2015-06-15 ENCOUNTER — Encounter (HOSPITAL_COMMUNITY): Payer: Medicare Other

## 2015-06-19 ENCOUNTER — Encounter: Payer: Self-pay | Admitting: Vascular Surgery

## 2015-06-19 ENCOUNTER — Ambulatory Visit (INDEPENDENT_AMBULATORY_CARE_PROVIDER_SITE_OTHER): Payer: Medicare Other | Admitting: Vascular Surgery

## 2015-06-19 VITALS — BP 151/87 | HR 68 | Temp 97.9°F | Ht 75.0 in | Wt 160.4 lb

## 2015-06-19 DIAGNOSIS — N186 End stage renal disease: Secondary | ICD-10-CM

## 2015-06-19 DIAGNOSIS — Z992 Dependence on renal dialysis: Secondary | ICD-10-CM

## 2015-06-19 NOTE — Progress Notes (Signed)
    Postoperative Access Visit   History of Present Illness  [name] is a 70 y.o. year old male who presents for postoperative follow-up for: evac L neck hematoma, R fem TDC (Date: 05/30/15).  The patient's wounds are healed.  The patient's current symptoms are: mild oozing for R fem TDC exit site.  For VQI Use Only  PRE-ADM LIVING: Home  AMB STATUS: Ambulatory  Physical Examination Filed Vitals:   06/19/15 0823 06/19/15 0824  BP: 155/84 151/87  Pulse: 68   Temp: 97.9 F (36.6 C)    L neck: small hematoma, inc healed, staples in place, exit site on L chest healed  R groin: exit site without active bleeding, R groin stab incision healed  Medical Decision Making  [name] is a 70 y.o. year old male who presents s/p L neck hematoma evac, R fem TDC .  I reviewed the patient's prior arm vein mapping: no compressible veins  At this point, the patient and family are going to pursue peritoneal dialysis.  Given the lack of vein options in this patient, I don't think there is a simple AVF backup option in this case.  I don't think placing an AVG as a backup is a good idea.  Thank you for allowing [place] to participate in this patient's care.  Adele Barthel, MD [facility] Office: [phone] Pager: [phone]  06/19/2015, 8:41 AM

## 2015-06-20 ENCOUNTER — Other Ambulatory Visit: Payer: Self-pay | Admitting: Cardiovascular Disease

## 2015-06-20 ENCOUNTER — Other Ambulatory Visit: Payer: Self-pay | Admitting: Family Medicine

## 2015-06-23 ENCOUNTER — Other Ambulatory Visit: Payer: Self-pay | Admitting: Surgery

## 2015-06-23 NOTE — H&P (Signed)
[name] 06/23/2015 10:02 AM Location: [facility] Patient #: [id number] DOB: December 24, 1945 Married / Language: English / Race: Black or African American Male  History of Present Illness [name]; 06/23/2015 10:51 AM) The patient is a 70 year old male who presents with a complaint of Peritoneal DIalysis. Patient sent by Dr. [name], family physician. End-stage renal disease. Dwindling venous access. Request for peritoneal dialysis catheter.  Pleasant elderly gentleman. Comes today with his wife. Worsening chronic kidney disease. Became dialysis dependent last spring. Has any history of aneurysms. 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 Has had upper extremity venous access surgery many times. Problems with aneurysmal formation or scarring no prior fistulas. Bleeding at access points. No success with revisions. Has had to have hematomas evacuated. Now gets dialysis through a catheter in his right groin.  Gets dialysis at [facility] up in [place]. Monday Wednesday Friday. Worsening problems with vascular access, they were considering peritoneal dialysis. Patient's wife very involved. See numerous videos and discussed with. No dialysis nurses at the nursing center. They wish to consider. No bowel cyst. Dwindling venous axis, Dr. [name] with vascular surgery agreed. Primary care physician afraid. She is discussed with her nephrologist. He is never had any infections. No MRSA. Never had any abdominal surgery. He does walk with a walker for balance issues. Claims he can walk about 20 minutes without much difficulty. His wife thinks it's more like around 10 minutes. He no longer smokes. Had an echocardiogram with a good ejection fraction around 50% last year. No new strokes. He is not on any anticoagulation, most likely to recurrent bleeding issues. He has some issues with urinary and stool incontinence and wears diapers.  Usually moves his bowels about once a day.    Other Problems [name], CMA; 06/23/2015 10:02 AM) Anxiety Disorder Arthritis Cerebrovascular Accident Chest pain Chronic Obstructive Lung Disease Chronic Renal Failure Syndrome Congestive Heart Failure Depression Enlarged Prostate Gastric Ulcer High blood pressure Hypercholesterolemia Myocardial infarction Vascular Disease  Past Surgical History [name], CMA; 06/23/2015 10:02 AM) Aneurysm Repair Colon Polyp Removal - Colonoscopy Dialysis Shunt / Fistula Oral Surgery  Diagnostic Studies History [name], CMA; 06/23/2015 10:02 AM) Colonoscopy 5-10 years ago  Allergies [name], CMA; 06/23/2015 10:02 AM) No Known Drug Allergies 06/23/2015  Medication History [name], CMA; 06/23/2015 10:03 AM) AmLODIPine Besylate (5MG  Tablet, Oral) Active. Atorvastatin Calcium (80MG  Tablet, Oral) Active. Nephro-Vite (0.8MG  Tablet, Oral) Active. Citalopram Hydrobromide (10MG  Tablet, Oral) Active. Ezetimibe (10MG  Tablet, Oral) Active. Pantoprazole Sodium (40MG  Tablet DR, Oral) Active. MiraLax (Oral) Active. Oxycodone-Acetaminophen (5-325MG  Tablet, Oral) Active. Medications Reconciled  Social History [name], [place]; 06/23/2015 10:02 AM) Alcohol use Remotely quit alcohol use. Caffeine use Carbonated beverages, Tea. No drug use Tobacco use Former smoker.  Family History [name], [place]; 06/23/2015 10:02 AM) Arthritis Mother. Cancer Mother. Heart Disease Mother, Sister. Heart disease in male family member before age 46 Hypertension Mother, Sister. Respiratory Condition Mother, Sister.     Review of Systems [name] CMA; 06/23/2015 10:02 AM) General Present- Appetite Loss and Fatigue. Not Present- Chills, Fever, Night Sweats, Weight Gain and Weight Loss. Skin Not Present- Change in Wart/Mole, Dryness, Hives, Jaundice, New Lesions, Non-Healing Wounds, Rash and Ulcer. HEENT  Present- Visual Disturbances and Wears glasses/contact lenses. Not Present- Earache, Hearing Loss, Hoarseness, Nose Bleed, Oral Ulcers, Ringing in the Ears, Seasonal Allergies, Sinus Pain, Sore Throat and Yellow Eyes. Respiratory Present- Chronic Cough. Not Present- Bloody sputum, Difficulty Breathing, Snoring and Wheezing. Breast  Not Present- Breast Mass, Breast Pain, Nipple Discharge and Skin Changes. Cardiovascular Present- Leg Cramps. Not Present- Chest Pain, Difficulty Breathing Lying Down, Palpitations, Rapid Heart Rate, Shortness of Breath and Swelling of Extremities. Gastrointestinal Present- Difficulty Swallowing. Not Present- Abdominal Pain, Bloating, Bloody Stool, Change in Bowel Habits, Chronic diarrhea, Constipation, Excessive gas, Gets full quickly at meals, Hemorrhoids, Indigestion, Nausea, Rectal Pain and Vomiting. Male Genitourinary Present- Change in Urinary Stream. Not Present- Blood in Urine, Frequency, Impotence, Nocturia, Painful Urination, Urgency and Urine Leakage. Musculoskeletal Present- Muscle Weakness. Not Present- Back Pain, Joint Pain, Joint Stiffness, Muscle Pain and Swelling of Extremities. Neurological Present- Decreased Memory, Trouble walking and Weakness. Not Present- Fainting, Headaches, Numbness, Seizures, Tingling and Tremor. Psychiatric Present- Anxiety. Not Present- Bipolar, Change in Sleep Pattern, Depression, Fearful and Frequent crying. Endocrine Present- Cold Intolerance. Not Present- Excessive Hunger, Hair Changes, Heat Intolerance, Hot flashes and New Diabetes. Hematology Present- Excessive bleeding. Not Present- Easy Bruising, Gland problems, HIV and Persistent Infections.  Vitals [name] CMA; 06/23/2015 10:04 AM) 06/23/2015 10:03 AM Weight: 155 lb Height: 75in Body Surface Area: 1.97 m Body Mass Index: 19.37 kg/m  Temp.: 97.63F(Temporal)  Pulse: 74 (Regular)  BP: 140/74 (Sitting, Left Arm, Standard)      Physical Exam [name]; 06/23/2015 10:47 AM)  General Mental Status-Alert. General Appearance-Not in acute distress, Not Sickly. Orientation-Oriented X3. Hydration-Well hydrated. Voice-Normal. Note: Walks with a walker. Calm. Relaxed.  Integumentary Global Assessment Upon inspection and palpation of skin surfaces of the - Axillae: non-tender, no inflammation or ulceration, no drainage. and Distribution of scalp and body hair is normal. General Characteristics Temperature - normal warmth is noted.  Head and Neck Head-normocephalic, atraumatic with no lesions or palpable masses. Face Global Assessment - atraumatic, no absence of expression. Neck Global Assessment - no abnormal movements, no bruit auscultated on the right, no bruit auscultated on the left, no decreased range of motion, non-tender. Trachea-midline. Thyroid Gland Characteristics - non-tender.  Eye Eyeball - Left-Extraocular movements intact, No Nystagmus. Eyeball - Right-Extraocular movements intact, No Nystagmus. Cornea - Left-No Hazy. Cornea - Right-No Hazy. Sclera/Conjunctiva - Left-No scleral icterus, No Discharge. Sclera/Conjunctiva - Right-No scleral icterus, No Discharge. Pupil - Left-Direct reaction to light normal. Pupil - Right-Direct reaction to light normal. Note: Wears glasses  ENMT Ears Pinna - Left - no drainage observed, no generalized tenderness observed. Right - no drainage observed, no generalized tenderness observed. Nose and Sinuses External Inspection of the Nose - no destructive lesion observed. Inspection of the nares - Left - quiet respiration. Right - quiet respiration. Mouth and Throat Lips - Upper Lip - no fissures observed, no pallor noted. Lower Lip - no fissures observed, no pallor noted. Nasopharynx - no discharge present. Oral Cavity/Oropharynx - Tongue - no dryness observed. Oral Mucosa - no cyanosis observed. Hypopharynx - no evidence of airway distress  observed.  Chest and Lung Exam Inspection Movements - Normal and Symmetrical. Accessory muscles - No use of accessory muscles in breathing. Palpation Palpation of the chest reveals - Non-tender. Auscultation Breath sounds - Normal and Clear.  Cardiovascular Auscultation Rhythm - Regular. Murmurs & Other Heart Sounds - Auscultation of the heart reveals - No Murmurs and No Systolic Clicks.  Abdomen Inspection Inspection of the abdomen reveals - No Visible peristalsis and No Abnormal pulsations. Umbilicus - No Bleeding, No Urine drainage. Palpation/Percussion Palpation and Percussion of the abdomen reveal - Soft, Non Tender, No Rebound tenderness, No Rigidity (guarding) and No Cutaneous hyperesthesia. Note: Thin. Soft.  Flat. Nontender. Nondistended. No diastases. No umbilical hernia.  Male Genitourinary Sexual Maturity Tanner 5 - Adult hair pattern and Adult penile size and shape. Note: Normal external genitalia. Epididymi, testes, and spermatic cords normal without any masses. No inguinal hernias.  Peripheral Vascular Upper Extremity Inspection - Left - No Cyanotic nailbeds, Not Ischemic. Right - No Cyanotic nailbeds, Not Ischemic. Note: Scarring in bilateral upper quadrants. Scar in left upper arm fistula with no thrill. Similar on right side. Right proximal lateral thigh dialysis catheter tunneled to femoral region. Hygiene clean.  Neurologic Neurologic evaluation reveals -normal attention span and ability to concentrate, able to name objects and repeat phrases. Appropriate fund of knowledge , normal sensation and normal coordination. Mental Status Affect - not angry, not paranoid. Cranial Nerves-Normal Bilaterally. Gait-Normal. Note: Quite but pleasant. Mild decreased memory. Good hand grip. Right handed.  Neuropsychiatric Mental status exam performed with findings of-able to articulate well with normal speech/language, rate, volume and coherence, thought content  normal with ability to perform basic computations and apply abstract reasoning and no evidence of hallucinations, delusions, obsessions or homicidal/suicidal ideation. Note: Quiet. Pleasant. No severe dementia. No delirium. No Paranoia. No psychosis.  Musculoskeletal Global Assessment Spine, Ribs and Pelvis - no instability, subluxation or laxity. Right Upper Extremity - no instability, subluxation or laxity.  Lymphatic Head & Neck  General Head & Neck Lymphatics: Bilateral - Description - No Localized lymphadenopathy. Axillary  General Axillary Region: Bilateral - Description - No Localized lymphadenopathy. Femoral & Inguinal  Generalized Femoral & Inguinal Lymphatics: Left - Description - No Localized lymphadenopathy. Right - Description - No Localized lymphadenopathy.    Assessment & Plan [name]; 06/23/2015 10:45 AM)  ESRD (END STAGE RENAL DISEASE) ON DIALYSIS (N18.6) Impression: Pleasant gentleman dialysis dependent for almost a year. Bleeding vascular access due to problems with aneurysms and scarring and failed chanson fistula. Now down to femoral catheters.  They are interested in proceeding with peritoneal dialysis. He's never had abdominal surgeries. The patient especially his wife and discussed at length with the patient's nephrologist Dr. benefit Cotto. Hasn't had educational videos and discuss with peritoneal dialysis nurses at the dialysis center at the [facility] clinic [place].  Think it is reasonable to place and see if it will help. Laparoscopically assisted. Patella to come out the left upper quadrant since he is right-handed. A supraumbilical since he has issues of incontinence to urine and stool and wears chronic diapers. Do this at [facility]. Do it on off dialysis day. Therefore Tuesday/Thursday.  Current Plans You are being scheduled for surgery - Our schedulers will call you.  You should hear from our office's scheduling department within 5 working days about  the location, date, and time of surgery. We try to make accommodations for patient's preferences in scheduling surgery, but sometimes the OR schedule or the surgeon's schedule prevents [place] from making those accommodations.  If you have not heard from our office [phone]) in 5 working days, call the office and ask for your surgeon's nurse.  If you have other questions about your diagnosis, plan, or surgery, call the office and ask for your surgeon's nurse.  The anatomy & physiology of peritoneum was discussed. Natural history risks without surgery of worsening renal failure was discussed. I feel the risks of no intervention will lead to serious problems that outweigh the operative risks; therefore, I recommended placement of a peritoneal dialysis catheter. I explained laparoscopic techniques with possible need for an open approach.  Risks such as  bleeding, infection, abscess, injury to other organs, catheter occlusion or malpositioning, reoperation to remove/reposition the catheter, heart attack, death, and other risks were discussed. I noted a good likelihood this will help address the problem. Possibility that this will not be enough to compensate for the renal failure & need for further treatment such as hemodialysis was explained. Goals of post-operative recovery were discussed as well. We will work to minimize complications.  The patient is/will be getting training on catheter use by dialysis nursing before and after surgery. I stressed the importance of meticulous care & sterile technique to prevent catheter problems. Questions were answered. The patient expresses understanding & wishes to proceed with surgery.  Pt Education - CCS Peritoneal Dialysis catheter placememt - CAPD Pt Education - CCS Pain control - tylenol only: discussed with patient and provided information. Pt Education - CCS Good Bowel Health (Navon Kotowski)  [name], M.D., F.A.C.S. Gastrointestinal and Minimally Invasive  Surgery [facility] [address], [place], [place] [address] [phone] Main / Paging

## 2015-07-09 ENCOUNTER — Encounter (HOSPITAL_COMMUNITY)
Admission: RE | Admit: 2015-07-09 | Discharge: 2015-07-09 | Disposition: A | Discharge status: Home / Self Care | Payer: Medicare Other | Source: Ambulatory Visit | Attending: Surgery | Admitting: Surgery

## 2015-07-09 ENCOUNTER — Encounter (HOSPITAL_COMMUNITY): Payer: Self-pay

## 2015-07-09 DIAGNOSIS — Z87891 Personal history of nicotine dependence: Secondary | ICD-10-CM | POA: Insufficient documentation

## 2015-07-09 DIAGNOSIS — Z992 Dependence on renal dialysis: Secondary | ICD-10-CM | POA: Diagnosis not present

## 2015-07-09 DIAGNOSIS — Z8673 Personal history of transient ischemic attack (TIA), and cerebral infarction without residual deficits: Secondary | ICD-10-CM | POA: Diagnosis not present

## 2015-07-09 DIAGNOSIS — I4891 Unspecified atrial fibrillation: Secondary | ICD-10-CM | POA: Diagnosis not present

## 2015-07-09 DIAGNOSIS — Z79899 Other long term (current) drug therapy: Secondary | ICD-10-CM | POA: Diagnosis not present

## 2015-07-09 DIAGNOSIS — Z01818 Encounter for other preprocedural examination: Secondary | ICD-10-CM | POA: Diagnosis present

## 2015-07-09 DIAGNOSIS — E785 Hyperlipidemia, unspecified: Secondary | ICD-10-CM | POA: Diagnosis not present

## 2015-07-09 DIAGNOSIS — I252 Old myocardial infarction: Secondary | ICD-10-CM | POA: Insufficient documentation

## 2015-07-09 DIAGNOSIS — Z01812 Encounter for preprocedural laboratory examination: Secondary | ICD-10-CM | POA: Diagnosis not present

## 2015-07-09 DIAGNOSIS — Z7982 Long term (current) use of aspirin: Secondary | ICD-10-CM | POA: Diagnosis not present

## 2015-07-09 DIAGNOSIS — I2510 Atherosclerotic heart disease of native coronary artery without angina pectoris: Secondary | ICD-10-CM | POA: Diagnosis not present

## 2015-07-09 DIAGNOSIS — I120 Hypertensive chronic kidney disease with stage 5 chronic kidney disease or end stage renal disease: Secondary | ICD-10-CM | POA: Diagnosis not present

## 2015-07-09 DIAGNOSIS — N186 End stage renal disease: Secondary | ICD-10-CM | POA: Insufficient documentation

## 2015-07-09 HISTORY — DX: Chronic kidney disease, unspecified: N18.9

## 2015-07-09 LAB — COMPREHENSIVE METABOLIC PANEL
ALK PHOS: 78 U/L (ref 38–126)
ALT: 16 U/L — AB (ref 17–63)
AST: 24 U/L (ref 15–41)
Albumin: 4 g/dL (ref 3.5–5.0)
Anion gap: 14 (ref 5–15)
BUN: 21 mg/dL — AB (ref 6–20)
CALCIUM: 9.6 mg/dL (ref 8.9–10.3)
CO2: 26 mmol/L (ref 22–32)
CREATININE: 6.31 mg/dL — AB (ref 0.61–1.24)
Chloride: 100 mmol/L — ABNORMAL LOW (ref 101–111)
GFR, EST AFRICAN AMERICAN: 9 mL/min — AB (ref >60)
GFR, EST NON AFRICAN AMERICAN: 8 mL/min — AB (ref >60)
Glucose, Bld: 88 mg/dL (ref 65–99)
Potassium: 4.7 mmol/L (ref 3.5–5.1)
Sodium: 140 mmol/L (ref 135–145)
Total Bilirubin: 0.7 mg/dL (ref 0.3–1.2)
Total Protein: 7.7 g/dL (ref 6.5–8.1)

## 2015-07-09 LAB — CBC
HCT: 32.1 % — ABNORMAL LOW (ref 39.0–52.0)
Hemoglobin: 10.7 g/dL — ABNORMAL LOW (ref 13.0–17.0)
MCH: 33.5 pg (ref 26.0–34.0)
MCHC: 33.3 g/dL (ref 30.0–36.0)
MCV: 100.6 fL — ABNORMAL HIGH (ref 78.0–100.0)
PLATELETS: 143 10*3/uL — AB (ref 150–400)
RBC: 3.19 MIL/uL — AB (ref 4.22–5.81)
RDW: 14.8 % (ref 11.5–15.5)
WBC: 5.6 10*3/uL (ref 4.0–10.5)

## 2015-07-09 NOTE — Pre-Procedure Instructions (Signed)
    [name]  07/09/2015      [id number] - [place], [place] [address] [place] [address] Phone: [phone] Fax: [phone]    Your procedure is scheduled on Thursday April 27th at 2 pm             Report to [facility] Admitting at 12pm  Call this number if you have problems the morning of surgery:[phone]  If questions prior to surgery date call [phone] between 8 & 4pm   Remember:  Do not eat food or drink liquids after midnight.  Take these medicines the morning of surgery with A SIP OF WATER: Norvasc (amplodipine), celexa, protonix, pain pill if needed Stop taking Aspirin and vitamin supplements as of today.  Do not take Nsaids such as ibuprofen, aleve, advil or motrin.   Do not wear jewelry, make-up or nail polish.  Do not wear lotions, powders, or perfumes.  You may wear deodorant.  Do not shave 48 hours prior to surgery.  Men may shave face and neck.  Do not bring valuables to the hospital.  [facility] is not responsible for any belongings or valuables.  Contacts, dentures or bridgework may not be worn into surgery.  Leave your suitcase in the car.  After surgery it may be brought to your room.  Patients discharged the day of surgery will not be allowed to drive home.   Special instructions:  Shower the night before and morning of surgery with CHG as instructed  Please read over the following fact sheets that you were given. Pain Booklet, Coughing and Deep Breathing and Surgical Site Infection Prevention

## 2015-07-09 NOTE — Progress Notes (Signed)
Pt has strong heart history and also has had multiple TIA's in the past.  Being followed by Dr Bronson Ing in [place].  Dr. Johney Maine has orders to stop taking Aspirin which I did instruct pt to do. But I also left a message with his cardiologist to verify that it is OK to stop the Aspirin and to call us if his instructions should change.

## 2015-07-09 NOTE — Progress Notes (Signed)
Pt is very poor informant.  Cardiologist Dr Bronson Ing in [place].  Last seen 04/14/15. Notes are in EPIC  PCP Dr [name]  No stress test in [facility].  Pt uncertain if ever had one done. Echo 05/24/14 in EPIC Heart Cath 10/04/11 in EPIC  EKG 05/27/15 in EPIC CXR 05/04/15 in [facility]

## 2015-07-10 NOTE — Progress Notes (Signed)
Anesthesia Chart Review:  Pt is a 70 year old male scheduled for laparoscopic insertion continuous ambulatory peritoneal dialysis catheter, possible inguinal or umbilical hernia repair, possible lysis of adhesions on 07/16/2015 with Dr. Johney Maine  Cardiologist is Dr. Bronson Ing, last visit 04/14/15. His note indicates that patient has a history of multiple TIAs but only on ASA due to high risk of ICH due to prior history of hemorrhagic strokes and high fall risk.  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 Former smoker. BMI 20.   Pt is s/p multiple dialysis access procedures, most recently 05/30/15.   Medications include: amlodipine, ASA, lipitor, zetia, protonix  Preoperative labs reviewed.  Renal function c/w ESRD.   1 view CXR 05/30/15: No evidence of acute cardiopulmonary disease.  EKG 05/27/15: Sinus rhythm. Short PR interval. Anterior infarct, old. Borderline repolarization abnormality.   05/24/14 Echo: Study Conclusions - Left ventricle: The cavity size was normal. There was moderate concentric hypertrophy. Systolic function was normal. The estimated ejection fraction was in the range of 50% to 55%. Wall motion was normal; there were no regional wall motion abnormalities. Doppler parameters are consistent with abnormal left ventricular relaxation (grade 1 diastolic dysfunction). Doppler parameters are consistent with high ventricular filling pressure. - Ventricular septum: Septal motion showed abnormal function and dyssynergy. These changes are consistent with intraventricular conduction delay. - Aortic valve: Mildly thickened, mildly calcified leaflets. There was moderate regurgitation. - Mitral valve: There was trivial regurgitation. - Left atrium: The atrium was moderately dilated. Volume/bsa, ES (1-plane Simpson&'s, A4C): 37.1 ml/m^2. - Right atrium: The atrium  was mildly dilated. - Tricuspid valve: There was trivial regurgitation.  05/24/14 Carotid U/S: Summary: Bilateral: intimal wall thickening CCA. Mild mixed plaque origin ICA. 1-39% ICA stenosis. Vertebral artery flow is antegrade.  10/04/11 LHC: Coronary angiography: Coronary dominance: right - Left mainstem: No obstruction - Left anterior descending (LAD): 95% proximal lesion just after tiny diagonal and before septal. There is 30% segmental plaque beyond this. The distal LAD after the second diagonal has about 50% plaquing then a focal 70% lesion. The distal vessel wraps the apex. The second diagonal has an 80% focal short discrete lesion. The diagonal is only modest in size.  - Left circumflex (LCx): Consists of three major OM branches. The second branch has 30% segmental plaque proximally. The AV circ has 30-405 mid plaque (OM3). No critical disease.  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 There is also a mid 50-60% lesion. There is mild irregularity distally. The PDA is moderate in size.  - Left ventriculography:Not done Final Conclusions:  1. Critical proximal LAD lesion proximally with large vessel distribution successfully stented with BMS. See report. DAPT for 12 months, if possible. 2. Residual LAD disease as noted (Dr. Burt Knack and I reviewed and agree conservative management for now of other disease) 3. CKD, stage 3  If no changes, I anticipate pt can proceed with surgery as scheduled.   Willeen Cass, FNP-BC [facility]/Anesthesiology Phone: [phone] 07/10/2015 10:37 AM

## 2015-07-15 MED ORDER — CEFAZOLIN SODIUM-DEXTROSE 2-4 GM/100ML-% IV SOLN
2.0000 g | INTRAVENOUS | Status: AC
  Administered 2015-07-16: 2 g via INTRAVENOUS
  Filled 2015-07-15: qty 100

## 2015-07-15 MED ORDER — GENTAMICIN IN SALINE 1-0.9 MG/ML-% IV SOLN
100.0000 mg | INTRAVENOUS | Status: DC
  Filled 2015-07-15: qty 100

## 2015-07-16 ENCOUNTER — Encounter (HOSPITAL_COMMUNITY)
Admission: RE | Disposition: A | Discharge status: Home / Self Care | Payer: Self-pay | Source: Ambulatory Visit | Attending: Surgery

## 2015-07-16 ENCOUNTER — Ambulatory Visit (HOSPITAL_COMMUNITY)
Admission: RE | Admit: 2015-07-16 | Discharge: 2015-07-16 | Disposition: A | Discharge status: Home / Self Care | Payer: Medicare Other | Source: Ambulatory Visit | Attending: Surgery | Admitting: Surgery

## 2015-07-16 ENCOUNTER — Ambulatory Visit (HOSPITAL_COMMUNITY): Payer: Medicare Other | Admitting: Anesthesiology

## 2015-07-16 ENCOUNTER — Ambulatory Visit (HOSPITAL_COMMUNITY): Payer: Medicare Other | Admitting: Emergency Medicine

## 2015-07-16 DIAGNOSIS — Z79899 Other long term (current) drug therapy: Secondary | ICD-10-CM | POA: Insufficient documentation

## 2015-07-16 DIAGNOSIS — K4090 Unilateral inguinal hernia, without obstruction or gangrene, not specified as recurrent: Secondary | ICD-10-CM | POA: Insufficient documentation

## 2015-07-16 DIAGNOSIS — K661 Hemoperitoneum: Secondary | ICD-10-CM | POA: Diagnosis not present

## 2015-07-16 DIAGNOSIS — Z992 Dependence on renal dialysis: Secondary | ICD-10-CM

## 2015-07-16 DIAGNOSIS — Z87891 Personal history of nicotine dependence: Secondary | ICD-10-CM

## 2015-07-16 DIAGNOSIS — E7800 Pure hypercholesterolemia, unspecified: Secondary | ICD-10-CM | POA: Insufficient documentation

## 2015-07-16 DIAGNOSIS — I252 Old myocardial infarction: Secondary | ICD-10-CM

## 2015-07-16 DIAGNOSIS — F329 Major depressive disorder, single episode, unspecified: Secondary | ICD-10-CM | POA: Insufficient documentation

## 2015-07-16 DIAGNOSIS — I132 Hypertensive heart and chronic kidney disease with heart failure and with stage 5 chronic kidney disease, or end stage renal disease: Secondary | ICD-10-CM

## 2015-07-16 DIAGNOSIS — J449 Chronic obstructive pulmonary disease, unspecified: Secondary | ICD-10-CM

## 2015-07-16 DIAGNOSIS — I509 Heart failure, unspecified: Secondary | ICD-10-CM | POA: Insufficient documentation

## 2015-07-16 DIAGNOSIS — N186 End stage renal disease: Secondary | ICD-10-CM

## 2015-07-16 DIAGNOSIS — R4182 Altered mental status, unspecified: Secondary | ICD-10-CM | POA: Diagnosis not present

## 2015-07-16 HISTORY — PX: CAPD INSERTION: SHX5233

## 2015-07-16 HISTORY — PX: INGUINAL HERNIA REPAIR: SHX194

## 2015-07-16 LAB — POCT I-STAT 4, (NA,K, GLUC, HGB,HCT)
GLUCOSE: 82 mg/dL (ref 65–99)
HCT: 40 % (ref 39.0–52.0)
Hemoglobin: 13.6 g/dL (ref 13.0–17.0)
POTASSIUM: 4.8 mmol/L (ref 3.5–5.1)
Sodium: 138 mmol/L (ref 135–145)

## 2015-07-16 SURGERY — LAPAROSCOPIC INSERTION CONTINUOUS AMBULATORY PERITONEAL DIALYSIS  (CAPD) CATHETER
Anesthesia: General | Site: Groin | Laterality: Right

## 2015-07-16 MED ORDER — OXYCODONE HCL 5 MG PO TABS: 5.0000 mg | ORAL_TABLET | Freq: Once | ORAL | Status: DC | PRN

## 2015-07-16 MED ORDER — FENTANYL CITRATE (PF) 250 MCG/5ML IJ SOLN
INTRAMUSCULAR | Status: AC
  Filled 2015-07-16: qty 5

## 2015-07-16 MED ORDER — PROPOFOL 10 MG/ML IV BOLUS
INTRAVENOUS | Status: AC
  Filled 2015-07-16: qty 20

## 2015-07-16 MED ORDER — SODIUM CHLORIDE 0.9 % IV SOLN
INTRAVENOUS | Status: DC | PRN
  Administered 2015-07-16: 500 mL

## 2015-07-16 MED ORDER — GLYCOPYRROLATE 0.2 MG/ML IV SOSY
PREFILLED_SYRINGE | INTRAVENOUS | Status: AC
  Filled 2015-07-16: qty 3

## 2015-07-16 MED ORDER — CHLORHEXIDINE GLUCONATE 4 % EX LIQD: 1.0000 "application " | Freq: Once | CUTANEOUS | Status: DC

## 2015-07-16 MED ORDER — ESMOLOL HCL 100 MG/10ML IV SOLN
INTRAVENOUS | Status: DC | PRN
  Administered 2015-07-16: 30 mg via INTRAVENOUS

## 2015-07-16 MED ORDER — NEOSTIGMINE METHYLSULFATE 5 MG/5ML IV SOSY
PREFILLED_SYRINGE | INTRAVENOUS | Status: AC
  Filled 2015-07-16: qty 5

## 2015-07-16 MED ORDER — CISATRACURIUM BESYLATE (PF) 10 MG/5ML IV SOLN
INTRAVENOUS | Status: DC | PRN
  Administered 2015-07-16: 12 mg via INTRAVENOUS
  Administered 2015-07-16: 6 mg via INTRAVENOUS

## 2015-07-16 MED ORDER — BUPIVACAINE-EPINEPHRINE 0.25% -1:200000 IJ SOLN
INTRAMUSCULAR | Status: DC | PRN
  Administered 2015-07-16 (×2): 30 mL

## 2015-07-16 MED ORDER — OXYCODONE HCL 5 MG PO TABS: 5.0000 mg | ORAL_TABLET | Freq: Four times a day (QID) | ORAL | Status: DC | PRN

## 2015-07-16 MED ORDER — LIDOCAINE HCL (CARDIAC) 20 MG/ML IV SOLN
INTRAVENOUS | Status: DC | PRN
  Administered 2015-07-16 (×2): 50 mg via INTRATRACHEAL

## 2015-07-16 MED ORDER — FENTANYL CITRATE (PF) 250 MCG/5ML IJ SOLN
INTRAMUSCULAR | Status: DC | PRN
  Administered 2015-07-16 (×2): 100 ug via INTRAVENOUS

## 2015-07-16 MED ORDER — 0.9 % SODIUM CHLORIDE (POUR BTL) OPTIME
TOPICAL | Status: DC | PRN
  Administered 2015-07-16: 1000 mL

## 2015-07-16 MED ORDER — EPHEDRINE 5 MG/ML INJ
INTRAVENOUS | Status: AC
  Filled 2015-07-16: qty 10

## 2015-07-16 MED ORDER — BUPIVACAINE-EPINEPHRINE (PF) 0.25% -1:200000 IJ SOLN
INTRAMUSCULAR | Status: AC
  Filled 2015-07-16: qty 60

## 2015-07-16 MED ORDER — OXYCODONE HCL 5 MG/5ML PO SOLN: 5.0000 mg | Freq: Once | ORAL | Status: DC | PRN

## 2015-07-16 MED ORDER — ONDANSETRON HCL 4 MG/2ML IJ SOLN: 4.0000 mg | Freq: Once | INTRAMUSCULAR | Status: DC | PRN

## 2015-07-16 MED ORDER — SODIUM CHLORIDE 0.9 % IV SOLN
INTRAVENOUS | Status: DC
  Administered 2015-07-16 (×2): via INTRAVENOUS

## 2015-07-16 MED ORDER — ESMOLOL HCL 100 MG/10ML IV SOLN
INTRAVENOUS | Status: AC
  Filled 2015-07-16: qty 10

## 2015-07-16 MED ORDER — LIDOCAINE HCL 4 % EX SOLN
CUTANEOUS | Status: DC | PRN
  Administered 2015-07-16: 4 mL via TOPICAL

## 2015-07-16 MED ORDER — ONDANSETRON HCL 4 MG/2ML IJ SOLN
INTRAMUSCULAR | Status: DC | PRN
  Administered 2015-07-16: 4 mg via INTRAVENOUS

## 2015-07-16 MED ORDER — FENTANYL CITRATE (PF) 100 MCG/2ML IJ SOLN: 25.0000 ug | INTRAMUSCULAR | Status: DC | PRN

## 2015-07-16 MED ORDER — CISATRACURIUM BESYLATE 20 MG/10ML IV SOLN
INTRAVENOUS | Status: AC
  Filled 2015-07-16: qty 10

## 2015-07-16 MED ORDER — MIDAZOLAM HCL 2 MG/2ML IJ SOLN
INTRAMUSCULAR | Status: AC
  Filled 2015-07-16: qty 2

## 2015-07-16 MED ORDER — LIDOCAINE 2% (20 MG/ML) 5 ML SYRINGE
INTRAMUSCULAR | Status: AC
  Filled 2015-07-16: qty 5

## 2015-07-16 MED ORDER — ONDANSETRON HCL 4 MG/2ML IJ SOLN
INTRAMUSCULAR | Status: AC
  Filled 2015-07-16: qty 2

## 2015-07-16 MED ORDER — NEOSTIGMINE METHYLSULFATE 10 MG/10ML IV SOLN
INTRAVENOUS | Status: DC | PRN
Start: 2015-07-16 — End: 2015-07-16
  Administered 2015-07-16: 1 mg via INTRAVENOUS
  Administered 2015-07-16: 4 mg via INTRAVENOUS

## 2015-07-16 MED ORDER — PHENYLEPHRINE HCL 10 MG/ML IJ SOLN
10.0000 mg | INTRAVENOUS | Status: DC | PRN
  Administered 2015-07-16: 20 ug/min via INTRAVENOUS

## 2015-07-16 MED ORDER — GLYCOPYRROLATE 0.2 MG/ML IJ SOLN
INTRAMUSCULAR | Status: DC | PRN
  Administered 2015-07-16: .3 mg via INTRAVENOUS
  Administered 2015-07-16: .6 mg via INTRAVENOUS

## 2015-07-16 SURGICAL SUPPLY — 74 items
ADAPTER TITANIUM MEDIONICS (MISCELLANEOUS) ×5 IMPLANT
ADPR DLYS CATH STRL LF DISP (MISCELLANEOUS) ×3
BAG DECANTER FOR FLEXI CONT (MISCELLANEOUS) ×5 IMPLANT
BLADE SURG ROTATE 9660 (MISCELLANEOUS) IMPLANT
CANISTER SUCTION 2500CC (MISCELLANEOUS) IMPLANT
CATH EXTENDED DIALYSIS (CATHETERS) ×5 IMPLANT
CHLORAPREP W/TINT 26ML (MISCELLANEOUS) ×5 IMPLANT
COVER SURGICAL LIGHT HANDLE (MISCELLANEOUS) ×5 IMPLANT
DECANTER SPIKE VIAL GLASS SM (MISCELLANEOUS) ×5 IMPLANT
DEVICE TROCAR PUNCTURE CLOSURE (ENDOMECHANICALS) ×5 IMPLANT
DISSECTOR BLUNT TIP ENDO 5MM (MISCELLANEOUS) IMPLANT
DRAPE INCISE IOBAN 66X45 STRL (DRAPES) ×3 IMPLANT
DRAPE LAPAROTOMY T 98X78 PEDS (DRAPES) ×5 IMPLANT
DRAPE WARM FLUID 44X44 (DRAPE) ×5 IMPLANT
DRSG PAD ABDOMINAL 8X10 ST (GAUZE/BANDAGES/DRESSINGS) IMPLANT
DRSG TEGADERM 4X4.75 (GAUZE/BANDAGES/DRESSINGS) ×5 IMPLANT
ELECT CAUTERY BLADE 6.4 (BLADE) ×5 IMPLANT
ELECT REM PT RETURN 9FT ADLT (ELECTROSURGICAL) ×5
ELECTRODE REM PT RTRN 9FT ADLT (ELECTROSURGICAL) ×3 IMPLANT
GAUZE SPONGE 2X2 8PLY NS (GAUZE/BANDAGES/DRESSINGS) ×16 IMPLANT
GAUZE SPONGE 2X2 8PLY STRL LF (GAUZE/BANDAGES/DRESSINGS) ×3 IMPLANT
GAUZE SPONGE 4X4 12PLY STRL (GAUZE/BANDAGES/DRESSINGS) ×6 IMPLANT
GLOVE BIOGEL PI IND STRL 7.0 (GLOVE) ×2 IMPLANT
GLOVE BIOGEL PI IND STRL 8 (GLOVE) ×3 IMPLANT
GLOVE BIOGEL PI INDICATOR 7.0 (GLOVE) ×2
GLOVE BIOGEL PI INDICATOR 8 (GLOVE) ×2
GLOVE ECLIPSE 8.0 STRL XLNG CF (GLOVE) ×5 IMPLANT
GLOVE SS BIOGEL STRL SZ 7 (GLOVE) ×3 IMPLANT
GLOVE SUPERSENSE BIOGEL SZ 7 (GLOVE) ×2
GOWN STRL REUS W/ TWL LRG LVL3 (GOWN DISPOSABLE) ×6 IMPLANT
GOWN STRL REUS W/ TWL XL LVL3 (GOWN DISPOSABLE) ×3 IMPLANT
GOWN STRL REUS W/TWL LRG LVL3 (GOWN DISPOSABLE) ×10
GOWN STRL REUS W/TWL XL LVL3 (GOWN DISPOSABLE) ×5
KIT BASIN OR (CUSTOM PROCEDURE TRAY) ×5 IMPLANT
KIT ROOM TURNOVER OR (KITS) ×5 IMPLANT
MESH ULTRAPRO 6X6 15CM15CM (Mesh General) ×3 IMPLANT
NEEDLE 22X1 1/2 (OR ONLY) (NEEDLE) ×5 IMPLANT
NS IRRIG 1000ML POUR BTL (IV SOLUTION) ×5 IMPLANT
PACK SURGICAL SETUP 50X90 (CUSTOM PROCEDURE TRAY) ×5 IMPLANT
PAD ARMBOARD 7.5X6 YLW CONV (MISCELLANEOUS) ×10 IMPLANT
PENCIL BUTTON HOLSTER BLD 10FT (ELECTRODE) ×5 IMPLANT
SET EXT 12IN DIALYSIS STAY-SAF (MISCELLANEOUS) ×5 IMPLANT
SET IRRIG TUBING LAPAROSCOPIC (IRRIGATION / IRRIGATOR) IMPLANT
SLEEVE ENDOPATH XCEL 5M (ENDOMECHANICALS) ×5 IMPLANT
SPONGE GAUZE 2X2 STER 10/PKG (GAUZE/BANDAGES/DRESSINGS) ×2
SPONGE LAP 18X18 X RAY DECT (DISPOSABLE) ×5 IMPLANT
STYLET FALLER (MISCELLANEOUS) ×5 IMPLANT
STYLET FALLER MEDIONICS (MISCELLANEOUS) ×5 IMPLANT
SUT ETHIBOND NAB CT1 #1 30IN (SUTURE) IMPLANT
SUT MNCRL AB 4-0 PS2 18 (SUTURE) ×5 IMPLANT
SUT PDS AB 0 CT 36 (SUTURE) ×3 IMPLANT
SUT PDS AB 1 CT  36 (SUTURE) ×4
SUT PDS AB 1 CT 36 (SUTURE) ×6 IMPLANT
SUT PROLENE 0 CT 1 30 (SUTURE) ×20 IMPLANT
SUT PROLENE 2 0 CT2 30 (SUTURE) ×15 IMPLANT
SUT SILK 2 0 SH (SUTURE) IMPLANT
SUT VIC AB 0 CT1 27 (SUTURE) ×5
SUT VIC AB 0 CT1 27XBRD ANBCTR (SUTURE) ×3 IMPLANT
SUT VIC AB 3-0 SH 27 (SUTURE) ×5
SUT VIC AB 3-0 SH 27XBRD (SUTURE) ×1 IMPLANT
SUT VICRYL AB 2 0 TIES (SUTURE) ×5 IMPLANT
SYR 50ML LL SCALE MARK (SYRINGE) ×5 IMPLANT
SYR BULB 3OZ (MISCELLANEOUS) ×5 IMPLANT
SYR CONTROL 10ML LL (SYRINGE) ×5 IMPLANT
TOWEL OR 17X24 6PK STRL BLUE (TOWEL DISPOSABLE) ×5 IMPLANT
TOWEL OR 17X26 10 PK STRL BLUE (TOWEL DISPOSABLE) ×5 IMPLANT
TRAY LAPAROSCOPIC MC (CUSTOM PROCEDURE TRAY) ×5 IMPLANT
TROCAR 5MMX150MM (TROCAR) ×5 IMPLANT
TROCAR XCEL BLUNT TIP 100MML (ENDOMECHANICALS) ×3 IMPLANT
TROCAR XCEL NON-BLD 5MMX100MML (ENDOMECHANICALS) ×5 IMPLANT
TUBE CONNECTING 12'X1/4 (SUCTIONS)
TUBE CONNECTING 12X1/4 (SUCTIONS) IMPLANT
TUBING INSUFFLATION (TUBING) ×5 IMPLANT
YANKAUER SUCT BULB TIP NO VENT (SUCTIONS) IMPLANT

## 2015-07-16 NOTE — Anesthesia Procedure Notes (Signed)
Procedure Name: Intubation Date/Time: 07/16/2015 1:36 PM Performed by: Mariea Clonts Pre-anesthesia Checklist: Emergency Drugs available, Timeout performed, Suction available, Patient identified and Patient being monitored Patient Re-evaluated:Patient Re-evaluated prior to inductionOxygen Delivery Method: Circle system utilized Preoxygenation: Pre-oxygenation with 100% oxygen Intubation Type: IV induction Ventilation: Mask ventilation without difficulty Laryngoscope Size: Mac and 4 Grade View: Grade II Tube type: Oral Tube size: 7.5 mm Number of attempts: 1 Placement Confirmation: ETT inserted through vocal cords under direct vision,  breath sounds checked- equal and bilateral and positive ETCO2 Tube secured with: Tape Dental Injury: Teeth and Oropharynx as per pre-operative assessment

## 2015-07-16 NOTE — Anesthesia Preprocedure Evaluation (Signed)
Anesthesia Evaluation  Patient identified by MRN, date of birth, ID band Patient awake    Reviewed: Allergy & Precautions, NPO status , Patient's Chart, lab work & pertinent test results  Airway Mallampati: II  TM Distance: >3 FB Neck ROM: Full    Dental  (+) Edentulous Upper, Edentulous Lower   Pulmonary former smoker,    breath sounds clear to auscultation       Cardiovascular hypertension,  Rhythm:Regular Rate:Normal     Neuro/Psych    GI/Hepatic   Endo/Other    Renal/GU      Musculoskeletal   Abdominal   Peds  Hematology   Anesthesia Other Findings   Reproductive/Obstetrics                             Anesthesia Physical Anesthesia Plan  ASA: III  Anesthesia Plan: General   Post-op Pain Management:    Induction: Intravenous  Airway Management Planned: Oral ETT  Additional Equipment:   Intra-op Plan:   Post-operative Plan: Extubation in OR  Informed Consent: I have reviewed the patients History and Physical, chart, labs and discussed the procedure including the risks, benefits and alternatives for the proposed anesthesia with the patient or authorized representative who has indicated his/her understanding and acceptance.   Dental advisory given  Plan Discussed with: CRNA and Anesthesiologist  Anesthesia Plan Comments:         Anesthesia Quick Evaluation

## 2015-07-16 NOTE — Op Note (Signed)
07/16/2015  3:14 PM  PATIENT:  [name]  70 y.o. male  Patient Care Team: Kathyrn Drown, MD as PCP - General (Family Medicine) Danie Binder, MD (Gastroenterology) Serafina Mitchell, MD (Vascular Surgery) Herminio Commons, MD as Attending Physician (Cardiology) Fran Lowes, MD as Referring Physician (Nephrology) Michael Boston, MD as Consulting Physician (General Surgery)  PRE-OPERATIVE DIAGNOSIS:  dialysis-dependent end-stage renal disease with dwindling venous access  POST-OPERATIVE DIAGNOSIS:    dialysis-dependent end-stage renal disease with dwindling venous access Right direct inguinal hernia  PROCEDURE:   LAPAROSCOPIC INSERTION CONTINUOUS AMBULATORY PERITONEAL DIALYSIS  (CAPD) CATHETER LAPAROSCOPIC RIGHT INGUINAL HERNIA WITH MESH  SURGEON:  Surgeon(s): Michael Boston, MD  ASSISTANT: RN   ANESTHESIA:   local and IV sedation  EBL:  Total I/O In: 500 [I.V.:500] Out: -   Delay start of Pharmacological VTE agent (>24hrs) due to surgical blood loss or risk of bleeding:  no  DRAINS:   It is a long tunneled CAPD peritoneal dialysis catheter ([employer] [id number] curl cath with titanium extender)   SPECIMEN:  No Specimen  DISPOSITION OF SPECIMEN:  N/A  COUNTS:  YES  PLAN OF CARE: Discharge to home after PACU  PATIENT DISPOSITION:  PACU - hemodynamically stable.  INDICATION: Gentleman with twin bleeding venous axis.  Dialysis dependent.  Desire for peritoneal dialysis catheter placement.  The patient and especially his wife and undergone training and wished to proceed.  He has never had any intra-abdominal surgery.  Some endovascular surgery.  Recommended by nephrology.  I offered placement   The anatomy & physiology of peritoneum was discussed.  Natural history risks without surgery of worsening renal failure was discussed.   I feel the risks of no intervention will lead to serious problems that outweigh the operative risks; therefore, I recommended  placement of a peritoneal dialysis catheter.  I explained laparoscopic techniques with possible need for an open approach.    Risks such as bleeding, infection, abscess, injury to other organs, catheter occlusion or malpositioning, reoperation to remove/reposition the catheter, heart attack, death, and other risks were discussed.   I noted a good likelihood this will help address the problem.  Possibility that this will not be enough to compensate for the renal failure & need for further treatment such as hemodialysis was explained.  Goals of post-operative recovery were discussed as well.  We will work to minimize complications.   The patient is/will be getting training on catheter use by dialysis nursing before and after surgery.  I stressed the importance of meticulous care & sterile technique to prevent catheter problems.  Questions were answered.  The patient expresses understanding & wishes to proceed with surgery.  OR FINDINGS:   It is a long tunneled CAPD peritoneal dialysis catheter ([employer] [id number] curl cath with titanium extender) .  The curl tip of the catheter rests in the deep pelvis.   Entry into the peritoneum it is in the left suprapubic region just above the dome of the bladder.  Deepest cuff in the periumbilical paramedian rectus muscle just deep to the fascia location.  The exit site of the catheter on the skin is in the left upper abdomen subcostal region, 4 cm inferior to costal ridge, lateral clavicular line.  Blue adapter CAPD extension tubing attached  DESCRIPTION:   Informed consent was confirmed.  The patient underwent general anaesthesia without difficulty.  The patient was positioned appropriately.  VTE prevention in place.  The patient's abdomen was clipped, prepped, & draped in a  sterile fashion.  Surgical timeout confirmed our plan.  The patient was positioned in reverse Trendelenburg.  Abdominal entry was gained using optical entry technique in the right upper  abdomen.  Entry was clean.  I induced carbon dioxide insufflation.  Camera inspection revealed no injury.  Extra ports were carefully placed under direct laparoscopic visualization.   I did see a direct space inguinal hernia on the right side.  No hernias in the left pelvis The patient's omentum was already adherent to the left upper quadrant and required no omentopexy.    I proceeded with inguinal hernia repair.  I made a transverse incision through the inferior umbilical fold.  I made a small transverse nick through the anterior rectus fascia contralateral to the inguinal hernia side and placed a 0-vicryl stitch through the fascia.  I placed a Hasson trocar into the preperitoneal plane.  Entry was clean.  We induced carbon dioxide insufflation. Camera inspection revealed no injury.  I used a 5mm angled scope to bluntly free the peritoneum off the infraumbilical anterior abdominal wall.  I created enough of a preperitoneal pocket to place 60mm ports into the right & left mid-abdomen into this preperitoneal cavity.  I was able to redirect the prior right upper quadrant port into the preperitoneal space  I used blunt & focused sharp dissection to free the peritoneum off the flank and down to the pubic rim.  I focused on the right side where the hernia was detected preoperatively.  I freed the anteriolateral bladder wall off the anteriolateral pelvic wall on that side, sparing midline attachments. I could see the peritoneum going up into a hernia defect at the direct space.  I freed the peritoneum off the spermatic vessels & vas deferens.  I freed peritoneum off the retroperitoneum along the psoas muscle.  did a high ligation hernia sac details of closed off the peritoneum using laparoscopic intracorporeal suturing with 3-0 Vicryl.  Inspection on the left side confirmed no hernias on the left side so I left that area alone.    I chose a 15x15 cm sheet of mesh ( ultra-lightweight polypropylene = Ultrapro) to  patch the hernia.  I cut a sigmoid-shaped slit within a side of the mesh, about 6cm from one edge.  I placed the mesh into the preperitoneal space & laid it as a diamond such that the inferior point had a 6x6 cm corner flap resting in the true pelvis, covering the obturator & femoral foramina.     I allowed the bladder to return to the pubis, this helping tuck the corners of the mesh in the anteriolateral pelvis.  The medial corners overlapped each other across midline cephalad to the pubic rim.   This provided >2 inch coverage around the hernia.  Because the defects well covered and not particularly large, I did not place any tacks.  I removed his son port.  I closed him from medical fascia with interrupted Vicryl #1 PDS suture to an airtight seal.  I redirected the left upper quadrant port back into the peritoneal cavity.  I removed the left paramedian port.    I proceeded with placement of the peritoneal dialysis catheter.   I measured the catheter by surface anatomy such that the curl rested just distal to the pubic rim on the surface, marking where the deep cuff lied on the periumbilical skin.  I made an incision at that mark & tunnelled a 172mm long 58mm Applied dilating port obliquely through the abdominal wall from  the prior 5 mm paramedian/periumbilical port site through the abdominal wall inferiorly, splitting the left infraumbililcal rectus muscle, tunnelling into the preperitoneal space, and exiting into the peritoneum just cephalad to the dome of the bladder, just avoiding the mesh.  I placed the CAPD catheter through that port with the help of a stylet.  The curl of the CAPD catheter rested down in the cul de sac of the upper pelvis.   It flushed & aspirated 186mL heparinized saline well.  The deep cuff rested just under the anterior rectus fascia in the rectus muscle just slightly infraumbilical.  The Port was removed to allow the external end to exit the skin of the port site.  I placed a  purse-string stitch around the fascia at the exit site catheter using a Vicryl stitch on a UR-6 needle to provide a tight seal at the fascia.  I measured & cut to appropriate length the double-cuffed extension tubing so that the upper curve was 2cm below the costal ridge at the medial clavicular line.  I connected the upper double-cuff extra long catheter extention tubing to the tunnelled curl catheter using the [employer] titanium extender connector.    I tunneled the extended catheter using a long vascular tunneler with a blunt bullet-screw-on tip, entering a subcostal wound to the periumbilical wound.  I tunnelled the catheter from the paramedian 37mm port exit site though the abdominal wall out the subcostal wound.  Therefore, the  titanium connector was buried in the left upper quadrant SQ of the abdominal wall by that tunneling.  I left the proximal superficial cuff just inferior to the subcostal wound.  I attached a Faller stylet ([employer] [id number], 50 degree bend) spike-tip tunneler to the end of the extended tubing.  I secured it to the tubing with a prolene tie.  I used the Faller stylet to tunnel the remaining tubing inferiorly and laterally to exit the skin in the left upper quadrant at the lateral clavicular line, 7cm inferior & 7cm lateral to the subcostal wound, 4 cm distal to the costal ridge.  The most distal cuff rests superior to the final exit site in the SQ.  I removed the Faller stylet.  No stitches were placed at the exit site.       Hemostasis was excellent.  The catheter flushed and aspirated easily with heparinized saline into the CAPD catheter.  I attached to the [employer] titanium hep lock adapter to the tubing.  I then attached the peritoneal dialysis catheter adapter extension blue cap tubing to that as well.  I placed heparinized saline into the peritoneal cavity.  It easily flushed and easily aspirated.  I left 557mL in place.  I removed the ports.   I closed the skin using 4-0  monocryl stitch.  Sterile dressings were applied. The patient was extubated & arrived in the PACU in stable condition..  I had discussed postoperative care with the patient in the holding area. I am about to locate the patient's family and discuss operative findings and postoperative goals / instructions.  Instructions and contact information are written in the chart as well.  The patient will require close followup of the CAPD peritoneal dialysis catheter through the peritoneal dialysis [place] within 24 hours.  [name], M.D., F.A.C.S. Gastrointestinal and Minimally Invasive Surgery [facility] [address], [place], [place] [address] [phone] Main / Paging

## 2015-07-16 NOTE — Interval H&P Note (Signed)
History and Physical Interval Note:  07/16/2015 1:16 PM  [name]  has presented today for surgery, with the diagnosis of dialysis-dependent end-stage renal disease with dwindling venous access  The various methods of treatment have been discussed with the patient and family. After consideration of risks, benefits and other options for treatment, the patient has consented to  Procedure(s): [place]  (CAPD) CATHETER (Bilateral) POSSIBLE INGUINAL OR UNBILICAL HERNIA REPAIR, POSSIBLE LYSIS OF ADHESIONS (N/A) as a surgical intervention .  The patient's history has been reviewed, patient examined, no change in status, stable for surgery.  I have reviewed the patient's chart and labs.  Questions were answered to the patient's satisfaction.     [name] C.

## 2015-07-16 NOTE — Transfer of Care (Signed)
Immediate Anesthesia Transfer of Care Note  Patient: [name]  Procedure(s) Performed: Procedure(s): LAPAROSCOPIC INSERTION CONTINUOUS AMBULATORY PERITONEAL DIALYSIS  (CAPD) CATHETER (Bilateral) LAPAROSCOPIC RIGHT INGUINAL HERNIA WITH MESH (Right)  Patient Location: PACU  Anesthesia Type:General  Level of Consciousness: awake, alert  and oriented  Airway & Oxygen Therapy: Patient Spontanous Breathing and Patient connected to nasal cannula oxygen  Post-op Assessment: Report given to RN, Post -op Vital signs reviewed and stable and Patient moving all extremities X 4  Post vital signs: Reviewed and stable  Last Vitals:  Filed Vitals:   07/16/15 1533 07/16/15 1543  BP:  156/83  Pulse:  78  Temp:    Resp: 28 19    Last Pain:  Filed Vitals:   07/16/15 1548  PainSc: Asleep         Complications: No apparent anesthesia complications

## 2015-07-16 NOTE — Discharge Instructions (Signed)
PERITONEAL DIALYSIS (CAPD) CATHETER PLACEMENT: ° °POST OPERATIVE INSTRUCTIONS ° °FOLLOW UP with the Peritoneal Dialysis Nurses ° [address] [place], [place] [address] ° °Call [phone] or your neprologist to help arrange training/flushes of your CAPD catheter °  °The CAPD nurses & Nephrology usually follow you closely, making the need for follow-up in the [facility] surgery office redundant and therefore not always needed.  If they or you have concerns, please call us for possible follow-up in our office ° °1. Do NOT shower until dialysis nursing staff have advised.  Do NOT submerge in a bathtub or hot tub. °2. Your Home Therapy RN will advise and educate you on showering, bathing, and swimming when you are in training. °3. The Peritoneal Dialysis nurse will remove your waterproof bandages in the Dialysis Center a few days after surgery.  Do not remove the bandages until seen by them.  If you dressing becomes wet, saturated, or falls off call your Home Therapy RN. °4. ACTIVITIES as tolerated:   °a. You may resume regular (light) daily activities beginning the next day--such as daily self-care, walking, climbing stairs--gradually increasing activities as tolerated.  If you can walk 30 minutes without difficulty, it is safe to try more intense activity such as jogging, treadmill, bicycling, low-impact aerobics,  etc. °b. No swimming within the 1st month of catheter placement.  You must have a Dr's order. °c. Save the most intensive and strenuous activity for last such as sit-ups, heavy lifting, contact sports, etc  Refrain from any heavy lifting or straining until you are off narcotics for pain control.   °d. DO NOT PUSH THROUGH PAIN.  Let pain be your guide: If it hurts to do something, don't do it.  Pain is your body warning you to avoid that activity for another week until the pain goes down. °e. You may drive when you are no longer taking prescription pain medication, you can comfortably wear a seatbelt, and you can  safely maneuver your car and apply brakes. °f. You may have sexual intercourse when it is comfortable.  °g. Be sure your catheter is taped to Your abdomen nor injured. Did not allow catheter to ankle, or tension on the catheter-it may cause damage to skin over the catheter °h. You will be instructed on what to do an emergency, and will have access to on call our in 24 hours a day. The number to reach the home therapy nurse as listed below. °5. DIET: Follow a light bland diet the first 24 hours after arrival home, such as soup, liquids, crackers, etc.  Be sure to include lots of fluids daily.  Avoid fast food or heavy meals as your are more likely to get nauseated.   °6. Take your usually prescribed home medications unless otherwise directed. °7. PAIN CONTROL: °a. Pain is best controlled by a usual combination of three different methods TOGETHER: °i. Ice/Heat °ii. Tylenol (over the counter pain medication) °iii. Prescription pain medication °b. Most patients will experience some swelling and bruising around the incisions.  Ice packs or heating pads (30-60 minutes up to 6 times a day) will help. Use ice for the first few days to help decrease swelling and bruising, then switch to heat to help relax tight/sore spots and speed recovery.  Some people prefer to use ice alone, heat alone, alternating between ice & heat.  Experiment to what works for you.  Swelling and bruising can take several weeks to resolve.   °c. It is helpful to take   an over-the-counter pain medication regularly for the first few weeks.  Using acetaminophen (Tylenol, etc) 500-650mg four times a day (every meal & bedtime) is usually safest since NSAIDs are not advisable in patients with kidney disease. °d. A  prescription for pain medication (such as oxycodone, hydrocodone, etc) should be given to you upon discharge.  Take your pain medication as prescribed.  °i. If you are having problems/concerns with the prescription medicine (does not control pain,  nausea, vomiting, rash, itching, etc), please call us [phone] to see if we need to switch you to a different pain medicine that will work better for you and/or control your side effect better. °ii. If you need a refill on your pain medication, please contact your pharmacy.  They will contact our office to request authorization. Prescriptions will not be filled after 5 pm or on week-ends. °8. Avoid getting constipated.  Between the surgery and the pain medications, it is common to experience some constipation.  Increasing fluid intake and taking a fiber supplement (such as Metamucil, Citrucel, FiberCon, MiraLax, etc) 1-2 times a day regularly will usually help prevent this problem from occurring.  A mild laxative (prune juice, Milk of Magnesia, MiraLax, etc) should be taken according to package directions if there are no bowel movements after 48 hours.  ° ° ° ° ° °FOLLOW UP: °Peritoneal Dialysis nurses ° [address] [place], [place] [address] ° °Call [phone] or your neprologist to help arrange training/flushes of your CAPD catheter °  ° -The CAPD nurses & Nephrology usually follow you closely, making the need for follow-up in our office redundant and therefore not needed.  If they or you have concerns, please call us for possible follow-up in our office ° ° -Please call [facility] at [phone] only as needed. ° °WHEN TO CALL US [phone]: °1. Poor pain control °2. Reactions / problems with new medications (rash/itching, nausea, etc)  °3. Fever over 101.5 F (38.5 C) °4. Worsening swelling or bruising °5. Continued bleeding from incision. °6. Increased pain, redness, or drainage from the incision ° ° The clinic staff is available to answer your questions during regular business hours (8:30am-5pm).  Please don’t hesitate to call and ask to speak to one of our nurses for clinical concerns.  ° If you have a medical emergency, go to the nearest emergency room or call 911. ° A surgeon from [facility] is always on call at the hospitals ° °9. IF YOU HAVE DISABILITY OR FAMILY LEAVE FORMS, BRING THEM TO THE OFFICE FOR PROCESSING.  DO NOT GIVE THEM TO YOUR DOCTOR. ° °[facility] °[address], [address], [place], [place]  [address] ? °MAIN: [phone] ? TOLL FREE: [phone] ?  °FAX [phone] °[email or web address] ° °Peritoneal Dialysis - An Overview °Dialysis can be done using a machine outside of the body (hemodialysis). Or, it can be done inside the body (peritoneal dialysis). The word "peritoneal" refers to the lining or membrane of the belly (abdominal cavity). The peritoneal membrane is a thin, plastic-like lining inside the belly that covers the organs and fits in the abdominal or peritoneal cavity, such as the stomach, liver and the kidneys. This lining works like a filter. It will allow certain things to pass from your blood through the lining and into a special solution that has been placed into your belly. In this type of dialysis, the peritoneum is used to help clean the blood.  °If you need dialysis, your   kidneys are not working right. Healthy kidneys take out extra water and waste products, which becomes urine. When the kidneys do not do this, serious problems can develop. The waste and water build up in the blood. Your hands and feet might swell. You may feel tired, weak or sick to your stomach. Also, your blood pressure may rise. If not treated, you could die. Dialysis is a treatment that does the work that your kidneys would do if they were healthy. °· It cleans your blood.  °· It will make sure your body has the right amount of certain chemicals that it needs. They include potassium, sodium and bicarbonate.  °· It will help control your blood pressure.  °UNDERSTANDING PERITONEAL DIALYSIS °· Here is how peritoneal dialysis works:  °· First, you will have surgery to put a soft plastic tube (catheter) into your belly (abdomen). This will allow you  to easily connect yourself to special tubing, which will then let a special dialysis solution to be placed into your abdomen.  °· For each treatment, you will need at least one bag of dialysis solution (a liquid called dialysate). It is a mix of water that is pure and free of germs (sterile), sugar (dextrose) and the nutrients and minerals found in your blood. Sometimes, more than one bag is needed to get the right amount of fluid for your abdomen. Your caregiver will explain what size and how many bags you will need.  °· The dialysate is slowly put through the catheter to fill the abdomen (called the peritoneal cavity). This dialysate will need to stay in your body for 3-4 hours. This is known as the dwell time.  °· The solution is working to clean the blood and remove wastes from your body. At the end of this time, the solution is drained from your body through tubing into an empty bag. It is then replaced with a fresh dialysate.  °· The draining and replacing of the dialysate is called an exchange or cycle. The catheter is capped after each exchange. Once the solution is in your body, you are then free to do whatever you would like until the next exchange. Most people will need to do 4-5 exchanges each day.  °· There are two different methods that can be used.  °· Continuous ambulatory peritoneal dialysis (CAPD): You put the solution into your abdomen, cap your catheter and then go about your day. Several hours later, you reconnect to a tubing set up, drain out the solution and then put more solution in. This is done several times a day. No machine is needed.  °· Continuous cycler-assisted peritoneal dialysis (CCPD): A machine is used, which fills the abdomen with dialysate and then drains it. This happens several times. It usually is done at night while you are sleeping. When you wake up, you can disconnect from the machine and are free to go to go about your day.  °PREPARING FOR EXCHANGES °· Discuss the details  of the procedure with your caregivers. You will be working with a nurse who is specially trained in doing dialysis. Make sure you understand:  °· How to do an exchange.  °· How much solution you need.  °· What type of solution you will need.  °· How often you should do an exchange. Ask:  °· How many times each day?  °· When? At meals? At bedtime?  °· Always keep the dialysate bags and other supplies in a cool, clean and dry place.  °·   Keeping everything clean is very important.  °· The catheter and its cap must be free from germs (sterile)  °· The adapter also must be sterile. It attaches the dialysis bag and tubing to the catheter.  °· Clean the area of your body around the catheter every day. Use a chemical that fights infection (antiseptic).  °· Wash your hands thoroughly before starting an exchange.  °· You may be taught to wear a mask to cover your nose and mouth. This makes infection less likely to happen.  °· You may be taught to close doors, windows and turn off any fans before doing an exchange.  °· Check the dialysate bag very carefully.  °· Make sure it is the right size bag for you. This information is on the label.  °· Also, make sure it is the right mixture. For some people, the dialysate contents vary. For instance, the mixture might be a stronger solution for overnight.  °· Check the expiration date (the last date you can use the bag). It also is on the label. If the date has gone by, throw away the bag.  °· The solution should be clear. You should be able to see any writing on the side of the bag clearly through the solution. Do not use a cloudy solution.  °· Gently squeeze the bag to make sure there are no leaks.  °· Use a dry heating pad to warm the dialysate in the bag. Leave the cover on the bag while you do this.  °· This is for comfort. You can skip this step if you want.  °· Never place the bag of solution under warm or hot water. Water from a faucet is not sterile and could cause germs to  get into the bag. Infection could then result.  °PERFORMING AN EXCHANGE °· For continuous ambulatory dialysis:  °· Attach the dialysis bag and tubing to your catheter. Hang the bag so that gravity (the natural downward pull) draws the solution down and into your abdomen once the clamps are opened. This should take about 10 minutes.  °· Remove the bag and tubing from the catheter. Cap the catheter.  °· The solution stays in the abdomen for 3-4 hours (dwell time). The solution is working to clean the blood and remove wastes from your body.  °· When you are ready to drain the solution for another exchange, take the cap off the catheter. Then, attach the catheter to tubing, which is attached to an empty bag. Place this empty bag below the abdomen or on the floor or stool and undo the clamps.  °· Gravity helps pull the fluid out of the abdomen and into the bag. The fluid in the bag may look yellow and clear, like urine. It usually takes about 20 minutes to drain the fluid out of the abdomen.  °· When the solution has drained, start the process again by infusing a new bag of dialysate and then capping the catheter.  °· This should continue until you have used all of the solution that you are to use each day.  °· Sometimes, a small machine is used overnight. It is called a mini-cycler. This is done if the body cannot go all night without an exchange. The machine lets you sleep without having to get up and do an exchange.  °· For continuous cycler-assisted dialysis:  °· You will be taught how to set up or program your machine.  °· When you are ready for bed,   put the dialysate bags onto the cycler machine. Put on exactly the number of bags that your caregiver said to use.   Connect your catheter to the machine and turn the cycler machine on.   Overnight, the cycler will do several exchanges. It often does three to five, sometimes more.   Solution that is in your abdomen in the morning will stay during the day. The  machine is set to make the daytime solution stronger, if that is needed.   In the morning, you will disconnect from the machine and cap your catheter and go about your day.   Sometimes, an extra exchange is done during the day. This may be needed to remove excess waste or fluid.  IMPORTANT REMINDERS  You will need to follow a very strict schedule. Every step of the dialysis procedure must be done every day. Sometimes, several times a day. Altogether, this might take an extra 2 hours or more. However, you must stick to the routine. Do not skip a day. Do not skip a procedure.   Some people find it helpful to work with a Social worker or Education officer, museum in addition to the renal (kidney) nurse. They can help you figure out how to change your daily routine to fit in the dialysis sessions.   You may need to change your diet. Ask your caregiver for advice, or talk with a nutritionist about what you should and should not eat.   You will need to weigh yourself every day and keep track of what your weight is.   You may be taught how to check your blood pressure before every exchange. Your blood pressure reading will help determine what type of solution to use. If your blood pressure is too high, you may need a stronger solution.  RISKS AND COMPLICATIONS  Possible problems vary, depending on the method you use. Your overall health also can have an effect. Problems that could develop because of dialysis include:  Infection. This is the most common problem. It could occur:   In the peritoneum. This is called peritonitis.   Around the catheter.   Weight gain. The dialysate contains a type of sugar known as dextrose. Dextrose has a lot of calories. The body takes in several hundred calories from this sugar each day.   Weakened muscles in the abdomen. This can result from all of the fluid that your body has to hold in the abdomen.   Catheter replacement. Sometimes, a new one has to be put in.   Change in  dialysis method. Due to some complications, you may need to change to hemodialysis for a short time and have your dialysis done at a center.   Trouble adjusting to your new lifestyle. In some people, this leads to depression.   Sleep problems.   Dialysis-related amyloidosis. This sometimes occurs after 5 years of dialysis. Protein builds up in the blood. This can cause painful deposits on bones, joints and tendons (which connect muscle to bone). Or, it can cause hollow spots in bones that make them more likely to break.   Excess fluid. Your body may absorb too much of the fluid that is held in the abdomen. This can lead to heart or lung problems.  SEEK MEDICAL CARE IF:   You have any problems with an exchange.   The area around the catheter becomes red or painful.   The catheter seems loose, or it feels like it is coming out.   A bag of dialysate looks  cloudy. Or, the liquid is an unusual color.   Abdominal pain or discomfort.   You feel sick to your stomach (nauseous) or throw up (vomit).   You develop a fever of more than 102 F (38.9 C).  SEEK IMMEDIATE MEDICAL CARE IF:  You develop a fever of more than 102 F (38.9 C). Document Released: 01/02/2009 Document Revised: 02/24/2011 Document Reviewed: 01/02/2009 [facility] Patient Information 2012 [place].  Diet for Peritoneal Dialysis This diet may be modified in protein, sodium, phosphorus, potassium, or fluid, depending on your needs. The goals of nutrition therapy are similar to those for patients on hemodialysis. Providing enough protein to replace peritoneal losses is a priority. USES OF THIS DIET The diet is designed for the patient with end-stage kidney (renal) disease, who is treated by peritoneal dialysis. Treatment options include:  Continuous Ambulatory Peritoneal Dialysis (CAPD): Usually 4 exchanges of 1.5 to 2 liter volumes of glucose (sugar) and electrolyte-containing dialysate.   Continuous Cyclic Peritoneal  Dialysis (CCPD): Essentially a reversal of CAPD, with shorter exchanges at night and a longer one during the day.   Intermittent Peritoneal Dialysis (IPD): 10 to 12 hours of exchanges, 2 to 3 times weekly.  ADEQUACY The diet may not meet the Recommended Dietary Allowances of the [employer] for calcium and ascorbic acid. Protein and water-soluble vitamin needs may be increased because of losses into the dialysate. Recommended daily supplements are the same as for hemodialysis patients. ASSESSMENT/DETERMINATION OF DIET Dietary needs will differ between patients. Parameters must be individualized. Protein  Guidelines: 1.2 to 1.3 gm/kg/day OR 1.5 gm/kg/day if patient is malnourished, catabolic, or has a protracted episode of peritonitis. A minimum of 50% of the protein intake should be of high biological value.   Goals: Meet protein requirements and replace dialysate losses while avoiding excessive accumulation of waste products. Achieve serum albumin greater than 3.5 g/dL.   Evaluate: Current nutritional status, serum albumin and BUN levels, presence of peritonitis.  Sodium  Guidelines: Usually 90 to 175 mEq (2000 to 4000 mg), but should be individualized.   Goals: Minimize complications of fluid imbalance.   Evaluate: Weight, blood pressure regulation, and presence of swelling (edema).  Potassium  Guidelines: Individualized; often not restricted, and may need to be supplemented.   Goals: Serum K+ levels between 4.0 to 5.0 mEq/L.   Evaluate: Serum K+ levels, usual intake of K+, appetite.  Phosphorus  Guidelines: 800 to 1200 mg/day (the high protein intake results in a high obligatory P intake).   Goal: Serum P levels between 4.5 to 6.0 mg/dL.   Evaluate: Serum P levels, usual P intake, P-binding medications: type, number, dosage, distribution.  Fluids  Guidelines: Individualized - may not be restricted for all patients.   Goal: Minimize complications of fluid  imbalance.   Evaluate: Weight, blood pressure regulation, sodium intake, and presence of edema.  Document Released: 03/07/2005 Document Revised: 02/24/2011 Document Reviewed: 05/30/2006 [facility] Patient Information 2012 [place].  Peritoneal Dialysis (CAPD) Catheter  Healthy kidneys remove excess water and waste products from the body in the form of urine. When the kidneys cannot do this, serious problems develop and a person can fall into kidney failure.   In most cases, the kidneys can heal and recover to a better place.  Sometimes the kidneys remain somewhat damaged and a kidney specialist (nephrologist) needs to help manage the disease with medications and adjustments in diet.    Sometimes the kidney failure has progressed too far.  The bodys waste and  fluid build up in the blood. Hands and legs swell. You start to feel more weak or nauseated. Your blood pressure may rise, to seeing you at serious risk for heart attack and stroke.  If not treated, you will eventually die. Dialysis is a treatment that replaces the work that your kidneys would do if they were healthy to help keep you alive.  Dialysis can be done using a machine outside of the body (hemodialysis) connected to your blood; or, it can be done with a tube placed to enter your abdomen (peritoneal dialysis). The peritoneum is the inner lining of your abdominal cavity, covering the inner organs & abdominal wall.  This inner lining of peritoneum works like a filter.   Peritoneal dialysis involves placed several quarts of sterile fluid into the inner abdomen/peritoneal cavity.  The waste products and fluids can exchange across the peritoneal membrane.  The fluid is later removed.  This most poor every day.  Sometimes the catheter can be attached to a machine that runs at night while you sleep (continuous cycler-assisted dialysis = CCPD).  Sometimes the fluid can be infused in her abdomen and then later removed hours later (continuous  ambulatory peritoneal dialysis = CAPD).  If your nephrologist feels like you are a good candidate, you will be sent to be evaluated by a surgeon to consider placement of a peritoneal dialysis catheter.  Your nephrologist should have shown you educational videos .  You should discuss with dialysis nurses about what life with a peritoneal dialysis catheter is like.  Most patients who have not had many abdominal operations are reasonable candidates for placement of a peritoneal dialysis catheter   It is important to understand the risks and benefits of the procedure prior to undergoing surgery.    If your surgeon feels you are a reasonable candidate, you will have a peritoneal dialysis catheter placed.    This requires an operation under general anesthesia.  The surgeon places a soft plastic tube ([place] curl catheter) into your abdomen.  The deeper curled tip rests down in the pelvis.  The middle part is tunneled inside your abdominal wall where cuffs provide a water-tight seal.  This leaves an outer foot of plastic tubing resting outside your body, usually in your lower abdomen below the beltline near your waist.  This is a quick procedure that can often be done as an outpatient with the possibility of staying overnight.  It takes several weeks for the catheter to heal into a watertight seal.  Initially, your nephrologist we will have the dialysis nurses do gentle flushes through the catheter.  Once the catheter is well sealed in, you will begin larger exchanges of fluid to do full peritoneal dialysis.  Your nephrologist and dialysis nurses will help guide you through this.  While placement of the catheter is usually not technically difficult, there are risks to the procedure.  The biggest risk is infection.  This is why we place the catheter in the operating room with the use of intravenous antibiotics.  Using sterile technique to hook up the catheter to your dialysis fluids is essential.  Most infections  of the catheter are mild and can be treated with antibiotics placed in the vein were placed with the peritoneal fluid.  However, sometimes the infection worsens or persists and the catheter needs to be removed.  Occasionally, the catheters can be blocked due to inner organs plugging up the tubing or kinks.  Sometimes the catheter leaks and needs to be  replaced.  Sometimes the inner abdomen has too many adhesions from prior surgeries or infections and there is not enough space for fluid exchanges to occur.  Sometimes, peritoneal dialysis is not enough to compensate for the kidney failure.   In rare instances, the inner lining of the abdomen becomes severely thickened or inflamed and peritoneal dialysis can no longer work.  Sometimes it is not possible to safely replace a new catheter and the patient must go on hemodialysis permanently.  It is very common to need hemodialysis intermittently even if you have a peritoneal dialysis catheter in cases of emergencies or if peritoneal dialysis fails.  Your nephrologist and surgeon can help you decide what the best form of dialysis is for you  Managing Pain  Pain after surgery or related to activity is often due to strain/injury to muscle, tendon, nerves and/or incisions.  This pain is usually short-term and will improve in a few months.   Many people find it helpful to do the following things TOGETHER to help speed the process of healing and to get back to regular activity more quickly:  1. Avoid heavy physical activity at first a. No lifting greater than 20 pounds at first, then increase to lifting as tolerated over the next few weeks b. Do not push through the pain.  Listen to your body and avoid positions and maneuvers than reproduce the pain.  Wait a few days before trying something more intense c. Walking is okay as tolerated, but go slowly and stop when getting sore.  If you can walk 30 minutes without stopping or pain, you can try more intense activity  (running, jogging, aerobics, cycling, swimming, treadmill, sex, sports, weightlifting, etc ) d. Remember: If it hurts to do it, then dont do it!  2. Take Acetaminophen Anti-inflammatory medication i. Acetaminophen 500mg  tabs (Tylenol) 1-2 pills with every meal and just before bedtime (avoid if you have liver problems) ii. Take with food/snack around the clock for 1-2 weeks iii. This helps the muscle and nerve tissues become less irritable and calm down faster  3. Use a Heating pad or Ice/Cold Pack a. 4-6 times a day b. May use warm bath/hottub  or showers  4. Try Gentle Massage and/or Stretching  a. at the area of pain many times a day b. stop if you feel pain - do not overdo it  Try these steps together to help you body heal faster and avoid making things get worse.  Doing just one of these things may not be enough.    If you are not getting better after two weeks or are noticing you are getting worse, contact our office for further advice; we may need to re-evaluate you & see what other things we can do to help.  GETTING TO GOOD BOWEL HEALTH. Irregular bowel habits such as constipation and diarrhea can lead to many problems over time.  Having one soft bowel movement a day is the most important way to prevent further problems.  The anorectal canal is designed to handle stretching and feces to safely manage our ability to get rid of solid waste (feces, poop, stool) out of our body.  BUT, hard constipated stools can act like ripping concrete bricks and diarrhea can be a burning fire to this very sensitive area of our body, causing inflamed hemorrhoids, anal fissures, increasing risk is perirectal abscesses, abdominal pain/bloating, an making irritable bowel worse.      The goal: ONE SOFT BOWEL MOVEMENT A DAY!  To  have soft, regular bowel movements:   Drink plenty of fluids, consider 4-6 tall glasses of water a day.    Take plenty of fiber.  Fiber is the undigested part of plant food that  passes into the colon, acting s natures broom to encourage bowel motility and movement.  Fiber can absorb and hold large amounts of water. This results in a larger, bulkier stool, which is soft and easier to pass. Work gradually over several weeks up to 6 servings a day of fiber (25g a day even more if needed) in the form of: o Vegetables -- Root (potatoes, carrots, turnips), leafy green (lettuce, salad greens, celery, spinach), or cooked high residue (cabbage, broccoli, etc) o Fruit -- Fresh (unpeeled skin & pulp), Dried (prunes, apricots, cherries, etc ),  or stewed ( applesauce)  o Whole grain breads, pasta, etc (whole wheat)  o Bran cereals   Bulking Agents -- This type of water-retaining fiber generally is easily obtained each day by one of the following:  o Psyllium bran -- The psyllium plant is remarkable because its ground seeds can retain so much water. This product is available as Metamucil, Konsyl, Effersyllium, Per Diem Fiber, or the less expensive generic preparation in drug and health food stores. Although labeled a laxative, it really is not a laxative.  o Methylcellulose -- This is another fiber derived from wood which also retains water. It is available as Citrucel. o Polyethylene Glycol - and artificial fiber commonly called Miralax or Glycolax.  It is helpful for people with gassy or bloated feelings with regular fiber o Flax Seed - a less gassy fiber than psyllium  No reading or other relaxing activity while on the toilet. If bowel movements take longer than 5 minutes, you are too constipated  AVOID CONSTIPATION.  High fiber and water intake usually takes care of this.  Sometimes a laxative is needed to stimulate more frequent bowel movements, but   Laxatives are not a good long-term solution as it can wear the colon out.  They can help jump-start bowels if constipated, but should be relied on constantly without discussing with your doctor o Osmotics (Milk of Magnesia, Fleets  phosphosoda, Magnesium citrate, MiraLax, GoLytely) are safer than  o Stimulants (Senokot, Castor Oil, Dulcolax, Ex Lax)    o Avoid taking laxatives for more than 7 days in a row.   IF SEVERELY CONSTIPATED, try a Bowel Retraining Program: o Do not use laxatives.  o Eat a diet high in roughage, such as bran cereals and leafy vegetables.  o Drink six (6) ounces of prune or apricot juice each morning.  o Eat two (2) large servings of stewed fruit each day.  o Take one (1) heaping tablespoon of a psyllium-based bulking agent twice a day. Use sugar-free sweetener when possible to avoid excessive calories.  o Eat a normal breakfast.  o Set aside 15 minutes after breakfast to sit on the toilet, but do not strain to have a bowel movement.  o If you do not have a bowel movement by the third day, use an enema and repeat the above steps.   Controlling diarrhea o Switch to liquids and simpler foods for a few days to avoid stressing your intestines further. o Avoid dairy products (especially milk & ice cream) for a short time.  The intestines often can lose the ability to digest lactose when stressed. o Avoid foods that cause gassiness or bloating.  Typical foods include beans and other legumes, cabbage, broccoli, and  dairy foods.  Every person has some sensitivity to other foods, so listen to our body and avoid those foods that trigger problems for you. o Adding fiber (Citrucel, Metamucil, psyllium, Miralax) gradually can help thicken stools by absorbing excess fluid and retrain the intestines to act more normally.  Slowly increase the dose over a few weeks.  Too much fiber too soon can backfire and cause cramping & bloating. o Probiotics (such as active yogurt, Align, etc) may help repopulate the intestines and colon with normal bacteria and calm down a sensitive digestive tract.  Most studies show it to be of mild help, though, and such products can be costly. o Medicines: - Bismuth subsalicylate (ex.  Kayopectate, Pepto Bismol) every 30 minutes for up to 6 doses can help control diarrhea.  Avoid if pregnant. - Loperamide (Immodium) can slow down diarrhea.  Start with two tablets (4mg  total) first and then try one tablet every 6 hours.  Avoid if you are having fevers or severe pain.  If you are not better or start feeling worse, stop all medicines and call your doctor for advice o Call your doctor if you are getting worse or not better.  Sometimes further testing (cultures, endoscopy, X-ray studies, bloodwork, etc) may be needed to help diagnose and treat the cause of the diarrhea.  TROUBLESHOOTING IRREGULAR BOWELS 1) Avoid extremes of bowel movements (no bad constipation/diarrhea) 2) Miralax 17gm mixed in 8oz. water or juice-daily. May use BID as needed.  3) Gas-x,Phazyme, etc. as needed for gas & bloating.  4) Soft,bland diet. No spicy,greasy,fried foods.  5) Prilosec over-the-counter as needed  6) May hold gluten/wheat products from diet to see if symptoms improve.  7)  May try probiotics (Align, Activa, etc) to help calm the bowels down 7) If symptoms become worse call back immediately.  Dialysis Diet Dialysis is a treatment that cleans your blood. It is used when the kidneys are damaged. When you need dialysis, you should watch your diet. This is because some nutrients can build up in your blood between treatments and make you sick. These nutrients are:  Potassium.  Phosphorus.  Sodium. Your doctor or dietitian will:  Tell you how much of these you can have.  Tell you if you need to look out for other nutrients too.  Help you plan meals.  Tell you how much to drink each day. WHAT DO I NEED TO KNOW ABOUT THIS DIET?  Limit potassium. Potassium is in milk, fruits, and vegetables.  Limit phosphorus. Phosphorus is in milk, cheese, beans, nuts, and carbonated beverages.  Limit salt (sodium). Foods that have a lot sodium include processed and cured meats, ready-made frozen meals,  canned vegetables, and salty snack foods.  Do not use salt substitutes.  Try not to eat whole-grain foods and foods that have a lot of fiber.  Follow your doctor's instructions about how much to drink. You may be told to:  Write down what you drink.  Write down foods you eat that are made mostly from water, such as gelatin and soups.  Drink from small cups.  Ask your doctor if you should take a medicine that binds phosphorus.  Take vitamin and mineral supplements only as told by your doctor.  Eat meat, poultry, fish, and eggs. Limit nuts and beans.  Before you cook potatoes, cut them into small pieces. Then boil them in unsalted water.  Drain all fluid from cooked vegetables and canned fruits before you eat them. WHAT FOODS CAN I EAT?  Grains White bread. White rice. Cooked cereal. Unsalted popcorn. Tortillas. Pasta. Vegetables Fresh or frozen broccoli, carrots, and green beans. Cabbage. Cauliflower. Celery. Cucumbers. Eggplant. Radishes. Zucchini. Fruits Apples. Fresh or frozen berries. Fresh or canned pears, peaches, and pineapple. Grapes. Plums. Meats and Other Protein Sources Fresh or frozen beef, pork, chicken, and fish. Eggs. Low-sodium canned tuna or salmon. Dairy Cream cheese. Heavy cream. Ricotta cheese. Beverages Apple cider. Cranberry juice. Grape juice. Lemonade. Black coffee. Condiments Herbs. Spices. Jam and jelly. Honey. Sweets and Desserts Sherbet. Cakes. Cookies. Fats and Oils Olive oil, canola oil, and safflower oil. Other Non-dairy creamer. Non-dairy whipped topping. Homemade broth without salt. The items listed above may not be a complete list of recommended foods or beverages. Contact your dietitian for more option WHAT FOODS ARE NOT RECOMMENDED? Grains Whole-grain bread. Whole-grain pasta. High-fiber cereal. Vegetables Potatoes. Beets. Tomatoes. Winter squash and pumpkin. Asparagus. Spinach. Parsnips. Fruits Star fruit. Bananas. Oranges. Kiwi.  Nectarines. Prunes. Melon. Dried fruit. Avocado. Meats and Other Protein Sources Canned, smoked, and cured meats. Soil scientist. Sardines. Nuts and seeds. Peanut butter. Beans and legumes. Dairy Milk. Buttermilk. Yogurt. Cheese and cottage cheese. Processed cheese spreads.  Beverages Orange juice. Prune juice. Carbonated soft drinks. Condiments Salt. Salt substitutes. Soy sauce.  Sweets and Desserts Ice cream. Chocolate. Candied nuts. Fats and Oils Butter. Margarine. Other Ready-made frozen meals. Canned soups.  The items listed above may not be a complete list of foods and beverages to avoid. Contact your dietitian for more information.   This information is not intended to replace advice given to you by your health care provider. Make sure you discuss any questions you have with your health care provider.   Document Released: 09/06/2011 Document Revised: 03/28/2014 Document Reviewed: 10/08/2013 Elsevier Interactive Patient Education 2016 [place] Kidney Disease The kidneys are two organs that lie on either side of the spine between the middle of the back and the front of the abdomen. The kidneys:   Remove wastes and extra water from the blood.   Produce important hormones. These help keep bones strong, regulate blood pressure, and help create red blood cells.   Balance the fluids and chemicals in the blood and tissues. End-stage kidney disease occurs when the kidneys are so damaged that they cannot do their job. When the kidneys cannot do their job, life-threatening problems occur. The body cannot stay clean and strong without the help of the kidneys. In end-stage kidney disease, the kidneys cannot get better.You need a new kidney or treatments to do some of the work healthy kidneys do in order to stay alive. CAUSES  End-stage kidney disease usually occurs when a long-lasting (chronic) kidney disease gets worse. It may also occur after the kidneys are  suddenly damaged (acute kidney injury).  SYMPTOMS   Swelling (edema) of the legs, ankles, or feet.   Tiredness (lethargy).   Nausea or vomiting.   Confusion.   Problems with urination, such as:   Decreased urine production.   Frequent urination, especially at night.   Frequent accidents in children who are potty trained.   Muscle twitches and cramps.   Persistent itchiness.   Loss of appetite.   Headaches.   Abnormally dark or light skin.   Numbness in the hands or feet.   Easy bruising.   Frequent hiccups.   Menstruation stops. DIAGNOSIS  Your health care provider will measure your blood pressure and take some tests. These may include:   Urine tests.   Blood  tests.   Imaging tests, such as:   An ultrasound exam.   Computed tomography (CT).  A kidney biopsy. TREATMENT  There are two treatments for end-stage kidney disease:   A procedure that removes toxic wastes from the body (dialysis).   Receiving a new kidney (kidney transplant). Both of these treatments have serious risks and consequences. Your health care provider will help you determine which treatment is best for you based on your health, age, and other factors. In addition to having dialysis or a kidney transplant, you may need to take medicines to control high blood pressure (hypertension) and cholesterol and to decrease phosphorus levels in your blood.  HOME CARE INSTRUCTIONS  Follow your prescribed diet.   Take medicines only as directed by your health care provider.   Do not take any new medicines (prescription, over-the-counter, or nutritional supplements) unless approved by your health care provider. Many medicines can worsen your kidney damage or need to have the dose adjusted.   Keep all follow-up visits as directed by your health care provider. MAKE SURE YOU:  Understand these instructions.  Will watch your condition.  Will get help right away if you are  not doing well or get worse.   This information is not intended to replace advice given to you by your health care provider. Make sure you discuss any questions you have with your health care provider.   Document Released: 05/28/2003 Document Revised: 03/28/2014 Document Reviewed: 11/04/2011 Elsevier Interactive Patient Education [employer].

## 2015-07-16 NOTE — H&P (View-Only) (Signed)
[name] 06/23/2015 10:02 AM Location: [facility] Patient #: [id number] DOB: 1945/09/04 Married / Language: English / Race: Black or African American Male  History of Present Illness Greg Hector MD; 06/23/2015 10:51 AM) The patient is a 70 year old male who presents with a complaint of Peritoneal DIalysis. Patient sent by Dr. Sallee [name], family physician. End-stage renal disease. Dwindling venous access. Request for peritoneal dialysis catheter.  Pleasant elderly gentleman. Comes today with his wife. Worsening chronic kidney disease. Became dialysis dependent last spring. Has any history of aneurysms. 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 Has had upper extremity venous access surgery many times. Problems with aneurysmal formation or scarring no prior fistulas. Bleeding at access points. No success with revisions. Has had to have hematomas evacuated. Now gets dialysis through a catheter in his right groin.  Gets dialysis at [facility] in [place]. Monday Wednesday Friday. Worsening problems with vascular access, they were considering peritoneal dialysis. Patient's wife very involved. See numerous videos and discussed with. No dialysis nurses at the nursing center. They wish to consider. No bowel cyst. Dwindling venous axis, Dr. Bridgett [name] with vascular surgery agreed. Primary care physician afraid. She is discussed with her nephrologist. He is never had any infections. No MRSA. Never had any abdominal surgery. He does walk with a walker for balance issues. Claims he can walk about 20 minutes without much difficulty. His wife thinks it's more like around 10 minutes. He no longer smokes. Had an echocardiogram with a good ejection fraction around 50% last year. No new strokes. He is not on any anticoagulation, most likely to recurrent bleeding issues. He has some issues with urinary and stool incontinence and wears diapers.  Usually moves his bowels about once a day.    Other Problems [name], CMA; 06/23/2015 10:02 AM) Anxiety Disorder Arthritis Cerebrovascular Accident Chest pain Chronic Obstructive Lung Disease Chronic Renal Failure Syndrome Congestive Heart Failure Depression Enlarged Prostate Gastric Ulcer High blood pressure Hypercholesterolemia Myocardial infarction Vascular Disease  Past Surgical History [name], CMA; 06/23/2015 10:02 AM) Aneurysm Repair Colon Polyp Removal - Colonoscopy Dialysis Shunt / Fistula Oral Surgery  Diagnostic Studies History [name], CMA; 06/23/2015 10:02 AM) Colonoscopy 5-10 years ago  Allergies [name], CMA; 06/23/2015 10:02 AM) No Known Drug Allergies 06/23/2015  Medication History [name], CMA; 06/23/2015 10:03 AM) AmLODIPine Besylate (5MG  Tablet, Oral) Active. Atorvastatin Calcium (80MG  Tablet, Oral) Active. Nephro-Vite (0.8MG  Tablet, Oral) Active. Citalopram Hydrobromide (10MG  Tablet, Oral) Active. Ezetimibe (10MG  Tablet, Oral) Active. Pantoprazole Sodium (40MG  Tablet DR, Oral) Active. MiraLax (Oral) Active. Oxycodone-Acetaminophen (5-325MG  Tablet, Oral) Active. Medications Reconciled  Social History [name], [place]; 06/23/2015 10:02 AM) Alcohol use Remotely quit alcohol use. Caffeine use Carbonated beverages, Tea. No drug use Tobacco use Former smoker.  Family History [name], [place]; 06/23/2015 10:02 AM) Arthritis Mother. Cancer Mother. Heart Disease Mother, Sister. Heart disease in male family member before age 3 Hypertension Mother, Sister. Respiratory Condition Mother, Sister.     Review of Systems [name] CMA; 06/23/2015 10:02 AM) General Present- Appetite Loss and Fatigue. Not Present- Chills, Fever, Night Sweats, Weight Gain and Weight Loss. Skin Not Present- Change in Wart/Mole, Dryness, Hives, Jaundice, New Lesions, Non-Healing Wounds, Rash and Ulcer. HEENT  Present- Visual Disturbances and Wears glasses/contact lenses. Not Present- Earache, Hearing Loss, Hoarseness, Nose Bleed, Oral Ulcers, Ringing in the Ears, Seasonal Allergies, Sinus Pain, Sore Throat and Yellow Eyes. Respiratory Present- Chronic Cough. Not Present- Bloody sputum, Difficulty Breathing, Snoring and Wheezing. Breast  Not Present- Breast Mass, Breast Pain, Nipple Discharge and Skin Changes. Cardiovascular Present- Leg Cramps. Not Present- Chest Pain, Difficulty Breathing Lying Down, Palpitations, Rapid Heart Rate, Shortness of Breath and Swelling of Extremities. Gastrointestinal Present- Difficulty Swallowing. Not Present- Abdominal Pain, Bloating, Bloody Stool, Change in Bowel Habits, Chronic diarrhea, Constipation, Excessive gas, Gets full quickly at meals, Hemorrhoids, Indigestion, Nausea, Rectal Pain and Vomiting. Male Genitourinary Present- Change in Urinary Stream. Not Present- Blood in Urine, Frequency, Impotence, Nocturia, Painful Urination, Urgency and Urine Leakage. Musculoskeletal Present- Muscle Weakness. Not Present- Back Pain, Joint Pain, Joint Stiffness, Muscle Pain and Swelling of Extremities. Neurological Present- Decreased Memory, Trouble walking and Weakness. Not Present- Fainting, Headaches, Numbness, Seizures, Tingling and Tremor. Psychiatric Present- Anxiety. Not Present- Bipolar, Change in Sleep Pattern, Depression, Fearful and Frequent crying. Endocrine Present- Cold Intolerance. Not Present- Excessive Hunger, Hair Changes, Heat Intolerance, Hot flashes and New Diabetes. Hematology Present- Excessive bleeding. Not Present- Easy Bruising, Gland problems, HIV and Persistent Infections.  Vitals [name] CMA; 06/23/2015 10:04 AM) 06/23/2015 10:03 AM Weight: 155 lb Height: 75in Body Surface Area: 1.97 m Body Mass Index: 19.37 kg/m  Temp.: 97.35F(Temporal)  Pulse: 74 (Regular)  BP: 140/74 (Sitting, Left Arm, Standard)      Physical Exam Greg Hector MD; 06/23/2015 10:47 AM)  General Mental Status-Alert. General Appearance-Not in acute distress, Not Sickly. Orientation-Oriented X3. Hydration-Well hydrated. Voice-Normal. Note: Walks with a walker. Calm. Relaxed.  Integumentary Global Assessment Upon inspection and palpation of skin surfaces of the - Axillae: non-tender, no inflammation or ulceration, no drainage. and Distribution of scalp and body hair is normal. General Characteristics Temperature - normal warmth is noted.  Head and Neck Head-normocephalic, atraumatic with no lesions or palpable masses. Face Global Assessment - atraumatic, no absence of expression. Neck Global Assessment - no abnormal movements, no bruit auscultated on the right, no bruit auscultated on the left, no decreased range of motion, non-tender. Trachea-midline. Thyroid Gland Characteristics - non-tender.  Eye Eyeball - Left-Extraocular movements intact, No Nystagmus. Eyeball - Right-Extraocular movements intact, No Nystagmus. Cornea - Left-No Hazy. Cornea - Right-No Hazy. Sclera/Conjunctiva - Left-No scleral icterus, No Discharge. Sclera/Conjunctiva - Right-No scleral icterus, No Discharge. Pupil - Left-Direct reaction to light normal. Pupil - Right-Direct reaction to light normal. Note: Wears glasses  ENMT Ears Pinna - Left - no drainage observed, no generalized tenderness observed. Right - no drainage observed, no generalized tenderness observed. Nose and Sinuses External Inspection of the Nose - no destructive lesion observed. Inspection of the nares - Left - quiet respiration. Right - quiet respiration. Mouth and Throat Lips - Upper Lip - no fissures observed, no pallor noted. Lower Lip - no fissures observed, no pallor noted. Nasopharynx - no discharge present. Oral Cavity/Oropharynx - Tongue - no dryness observed. Oral Mucosa - no cyanosis observed. Hypopharynx - no evidence of airway distress  observed.  Chest and Lung Exam Inspection Movements - Normal and Symmetrical. Accessory muscles - No use of accessory muscles in breathing. Palpation Palpation of the chest reveals - Non-tender. Auscultation Breath sounds - Normal and Clear.  Cardiovascular Auscultation Rhythm - Regular. Murmurs & Other Heart Sounds - Auscultation of the heart reveals - No Murmurs and No Systolic Clicks.  Abdomen Inspection Inspection of the abdomen reveals - No Visible peristalsis and No Abnormal pulsations. Umbilicus - No Bleeding, No Urine drainage. Palpation/Percussion Palpation and Percussion of the abdomen reveal - Soft, Non Tender, No Rebound tenderness, No Rigidity (guarding) and No Cutaneous hyperesthesia. Note: Thin. Soft.  Flat. Nontender. Nondistended. No diastases. No umbilical hernia.  Male Genitourinary Sexual Maturity Tanner 5 - Adult hair pattern and Adult penile size and shape. Note: Normal external genitalia. Epididymi, testes, and spermatic cords normal without any masses. No inguinal hernias.  Peripheral Vascular Upper Extremity Inspection - Left - No Cyanotic nailbeds, Not Ischemic. Right - No Cyanotic nailbeds, Not Ischemic. Note: Scarring in bilateral upper quadrants. Scar in left upper arm fistula with no thrill. Similar on right side. Right proximal lateral thigh dialysis catheter tunneled to femoral region. Hygiene clean.  Neurologic Neurologic evaluation reveals -normal attention span and ability to concentrate, able to name objects and repeat phrases. Appropriate fund of knowledge , normal sensation and normal coordination. Mental Status Affect - not angry, not paranoid. Cranial Nerves-Normal Bilaterally. Gait-Normal. Note: Quite but pleasant. Mild decreased memory. Good hand grip. Right handed.  Neuropsychiatric Mental status exam performed with findings of-able to articulate well with normal speech/language, rate, volume and coherence, thought content  normal with ability to perform basic computations and apply abstract reasoning and no evidence of hallucinations, delusions, obsessions or homicidal/suicidal ideation. Note: Quiet. Pleasant. No severe dementia. No delirium. No Paranoia. No psychosis.  Musculoskeletal Global Assessment Spine, Ribs and Pelvis - no instability, subluxation or laxity. Right Upper Extremity - no instability, subluxation or laxity.  Lymphatic Head & Neck  General Head & Neck Lymphatics: Bilateral - Description - No Localized lymphadenopathy. Axillary  General Axillary Region: Bilateral - Description - No Localized lymphadenopathy. Femoral & Inguinal  Generalized Femoral & Inguinal Lymphatics: Left - Description - No Localized lymphadenopathy. Right - Description - No Localized lymphadenopathy.    Assessment & Plan Greg Hector MD; 06/23/2015 10:45 AM)  ESRD (END STAGE RENAL DISEASE) ON DIALYSIS (N18.6) Impression: Pleasant gentleman dialysis dependent for almost a year. Bleeding vascular access due to problems with aneurysms and scarring and failed chanson fistula. Now down to femoral catheters.  They are interested in proceeding with peritoneal dialysis. He's never had abdominal surgeries. The patient especially his wife and discussed at length with the patient's nephrologist Dr. benefit Cotto. Hasn't had educational videos and discuss with peritoneal dialysis nurses at the dialysis center at the [facility] [place].  Think it is reasonable to place and see if it will help. Laparoscopically assisted. Patella to come out the left upper quadrant since he is right-handed. A supraumbilical since he has issues of incontinence to urine and stool and wears chronic diapers. [facility] this [facility]. Do it on off dialysis day. Therefore Tuesday/Thursday.  Current Plans You are being scheduled for surgery - Our schedulers will call you.  You should hear from our office's scheduling department within 5 working days about  the location, date, and time of surgery. We try to make accommodations for patient's preferences in scheduling surgery, but sometimes the OR schedule or the surgeon's schedule prevents [place] from making those accommodations.  If you have not heard from our office [phone]) in 5 working days, call the office and ask for your surgeon's nurse.  If you have other questions about your diagnosis, plan, or surgery, call the office and ask for your surgeon's nurse.  The anatomy & physiology of peritoneum was discussed. Natural history risks without surgery of worsening renal failure was discussed. I feel the risks of no intervention will lead to serious problems that outweigh the operative risks; therefore, I recommended placement of a peritoneal dialysis catheter. I explained laparoscopic techniques with possible need for an open approach.  Risks such as  bleeding, infection, abscess, injury to other organs, catheter occlusion or malpositioning, reoperation to remove/reposition the catheter, heart attack, death, and other risks were discussed. I noted a good likelihood this will help address the problem. Possibility that this will not be enough to compensate for the renal failure & need for further treatment such as hemodialysis was explained. Goals of post-operative recovery were discussed as well. We will work to minimize complications.  The patient is/will be getting training on catheter use by dialysis nursing before and after surgery. I stressed the importance of meticulous care & sterile technique to prevent catheter problems. Questions were answered. The patient expresses understanding & wishes to proceed with surgery.  Pt Education - [facility] Peritoneal Dialysis catheter placememt - CAPD Pt Education - [facility] Pain control - tylenol only: discussed with patient and provided information. Pt Education - [facility] Good Bowel Health ([place])  [name], M.D., F.A.C.S. Gastrointestinal and Minimally Invasive  Surgery [facility] [address], [place], [place] [address] [phone] Main / Paging

## 2015-07-17 ENCOUNTER — Inpatient Hospital Stay (HOSPITAL_COMMUNITY)
Admission: EM | Admit: 2015-07-17 | Discharge: 2015-07-25 | DRG: 350 | Disposition: A | Discharge status: Home Health | Payer: Medicare Other | Attending: Internal Medicine | Admitting: Internal Medicine

## 2015-07-17 ENCOUNTER — Emergency Department (HOSPITAL_COMMUNITY): Payer: Medicare Other

## 2015-07-17 ENCOUNTER — Encounter (HOSPITAL_COMMUNITY): Payer: Self-pay | Admitting: Emergency Medicine

## 2015-07-17 DIAGNOSIS — Z87891 Personal history of nicotine dependence: Secondary | ICD-10-CM

## 2015-07-17 DIAGNOSIS — D62 Acute posthemorrhagic anemia: Secondary | ICD-10-CM | POA: Diagnosis present

## 2015-07-17 DIAGNOSIS — G459 Transient cerebral ischemic attack, unspecified: Secondary | ICD-10-CM | POA: Diagnosis not present

## 2015-07-17 DIAGNOSIS — I959 Hypotension, unspecified: Secondary | ICD-10-CM | POA: Diagnosis present

## 2015-07-17 DIAGNOSIS — R531 Weakness: Secondary | ICD-10-CM

## 2015-07-17 DIAGNOSIS — D638 Anemia in other chronic diseases classified elsewhere: Secondary | ICD-10-CM | POA: Diagnosis present

## 2015-07-17 DIAGNOSIS — R7989 Other specified abnormal findings of blood chemistry: Secondary | ICD-10-CM | POA: Diagnosis not present

## 2015-07-17 DIAGNOSIS — E875 Hyperkalemia: Secondary | ICD-10-CM | POA: Diagnosis present

## 2015-07-17 DIAGNOSIS — I132 Hypertensive heart and chronic kidney disease with heart failure and with stage 5 chronic kidney disease, or end stage renal disease: Secondary | ICD-10-CM | POA: Diagnosis present

## 2015-07-17 DIAGNOSIS — F0150 Vascular dementia without behavioral disturbance: Secondary | ICD-10-CM | POA: Diagnosis present

## 2015-07-17 DIAGNOSIS — I252 Old myocardial infarction: Secondary | ICD-10-CM

## 2015-07-17 DIAGNOSIS — Z8673 Personal history of transient ischemic attack (TIA), and cerebral infarction without residual deficits: Secondary | ICD-10-CM

## 2015-07-17 DIAGNOSIS — I10 Essential (primary) hypertension: Secondary | ICD-10-CM | POA: Diagnosis present

## 2015-07-17 DIAGNOSIS — R58 Hemorrhage, not elsewhere classified: Secondary | ICD-10-CM

## 2015-07-17 DIAGNOSIS — J690 Pneumonitis due to inhalation of food and vomit: Secondary | ICD-10-CM | POA: Diagnosis present

## 2015-07-17 DIAGNOSIS — Z809 Family history of malignant neoplasm, unspecified: Secondary | ICD-10-CM

## 2015-07-17 DIAGNOSIS — Z992 Dependence on renal dialysis: Secondary | ICD-10-CM

## 2015-07-17 DIAGNOSIS — Z8679 Personal history of other diseases of the circulatory system: Secondary | ICD-10-CM

## 2015-07-17 DIAGNOSIS — R1310 Dysphagia, unspecified: Secondary | ICD-10-CM | POA: Diagnosis present

## 2015-07-17 DIAGNOSIS — K661 Hemoperitoneum: Principal | ICD-10-CM | POA: Diagnosis present

## 2015-07-17 DIAGNOSIS — D696 Thrombocytopenia, unspecified: Secondary | ICD-10-CM | POA: Diagnosis present

## 2015-07-17 DIAGNOSIS — D6959 Other secondary thrombocytopenia: Secondary | ICD-10-CM | POA: Diagnosis present

## 2015-07-17 DIAGNOSIS — I248 Other forms of acute ischemic heart disease: Secondary | ICD-10-CM | POA: Diagnosis present

## 2015-07-17 DIAGNOSIS — E8889 Other specified metabolic disorders: Secondary | ICD-10-CM | POA: Diagnosis present

## 2015-07-17 DIAGNOSIS — N186 End stage renal disease: Secondary | ICD-10-CM | POA: Diagnosis present

## 2015-07-17 DIAGNOSIS — I4891 Unspecified atrial fibrillation: Secondary | ICD-10-CM | POA: Diagnosis present

## 2015-07-17 DIAGNOSIS — G451 Carotid artery syndrome (hemispheric): Secondary | ICD-10-CM

## 2015-07-17 DIAGNOSIS — D649 Anemia, unspecified: Secondary | ICD-10-CM

## 2015-07-17 DIAGNOSIS — E785 Hyperlipidemia, unspecified: Secondary | ICD-10-CM | POA: Diagnosis present

## 2015-07-17 DIAGNOSIS — I429 Cardiomyopathy, unspecified: Secondary | ICD-10-CM | POA: Diagnosis present

## 2015-07-17 DIAGNOSIS — K567 Ileus, unspecified: Secondary | ICD-10-CM | POA: Diagnosis present

## 2015-07-17 DIAGNOSIS — R778 Other specified abnormalities of plasma proteins: Secondary | ICD-10-CM | POA: Diagnosis present

## 2015-07-17 DIAGNOSIS — Z8249 Family history of ischemic heart disease and other diseases of the circulatory system: Secondary | ICD-10-CM

## 2015-07-17 DIAGNOSIS — J449 Chronic obstructive pulmonary disease, unspecified: Secondary | ICD-10-CM | POA: Diagnosis present

## 2015-07-17 DIAGNOSIS — Z955 Presence of coronary angioplasty implant and graft: Secondary | ICD-10-CM

## 2015-07-17 DIAGNOSIS — Z825 Family history of asthma and other chronic lower respiratory diseases: Secondary | ICD-10-CM

## 2015-07-17 DIAGNOSIS — I5022 Chronic systolic (congestive) heart failure: Secondary | ICD-10-CM | POA: Diagnosis present

## 2015-07-17 DIAGNOSIS — K5641 Fecal impaction: Secondary | ICD-10-CM | POA: Diagnosis present

## 2015-07-17 DIAGNOSIS — R9389 Abnormal findings on diagnostic imaging of other specified body structures: Secondary | ICD-10-CM

## 2015-07-17 DIAGNOSIS — H547 Unspecified visual loss: Secondary | ICD-10-CM | POA: Diagnosis present

## 2015-07-17 DIAGNOSIS — K219 Gastro-esophageal reflux disease without esophagitis: Secondary | ICD-10-CM | POA: Diagnosis present

## 2015-07-17 DIAGNOSIS — D689 Coagulation defect, unspecified: Secondary | ICD-10-CM | POA: Diagnosis present

## 2015-07-17 DIAGNOSIS — I2510 Atherosclerotic heart disease of native coronary artery without angina pectoris: Secondary | ICD-10-CM | POA: Diagnosis present

## 2015-07-17 DIAGNOSIS — J9601 Acute respiratory failure with hypoxia: Secondary | ICD-10-CM | POA: Diagnosis present

## 2015-07-17 LAB — PROTIME-INR
INR: 1.42 (ref 0.00–1.49)
PROTHROMBIN TIME: 17.5 s — AB (ref 11.6–15.2)

## 2015-07-17 LAB — COMPREHENSIVE METABOLIC PANEL
ALT: 11 U/L — ABNORMAL LOW (ref 17–63)
ANION GAP: 15 (ref 5–15)
AST: 27 U/L (ref 15–41)
Albumin: 3.6 g/dL (ref 3.5–5.0)
Alkaline Phosphatase: 67 U/L (ref 38–126)
BILIRUBIN TOTAL: 0.4 mg/dL (ref 0.3–1.2)
BUN: 38 mg/dL — ABNORMAL HIGH (ref 6–20)
CHLORIDE: 106 mmol/L (ref 101–111)
CO2: 18 mmol/L — ABNORMAL LOW (ref 22–32)
Calcium: 8.7 mg/dL — ABNORMAL LOW (ref 8.9–10.3)
Creatinine, Ser: 8.35 mg/dL — ABNORMAL HIGH (ref 0.61–1.24)
GFR, EST AFRICAN AMERICAN: 7 mL/min — AB (ref >60)
GFR, EST NON AFRICAN AMERICAN: 6 mL/min — AB (ref >60)
Glucose, Bld: 133 mg/dL — ABNORMAL HIGH (ref 65–99)
POTASSIUM: 6 mmol/L — AB (ref 3.5–5.1)
Sodium: 139 mmol/L (ref 135–145)
TOTAL PROTEIN: 7 g/dL (ref 6.5–8.1)

## 2015-07-17 LAB — CBC
HEMATOCRIT: 26.2 % — AB (ref 39.0–52.0)
HEMOGLOBIN: 8.7 g/dL — AB (ref 13.0–17.0)
MCH: 34.3 pg — ABNORMAL HIGH (ref 26.0–34.0)
MCHC: 33.2 g/dL (ref 30.0–36.0)
MCV: 103.1 fL — ABNORMAL HIGH (ref 78.0–100.0)
Platelets: 106 10*3/uL — ABNORMAL LOW (ref 150–400)
RBC: 2.54 MIL/uL — AB (ref 4.22–5.81)
RDW: 14.7 % (ref 11.5–15.5)
WBC: 9 10*3/uL (ref 4.0–10.5)

## 2015-07-17 LAB — TROPONIN I
Troponin I: 0.06 ng/mL — ABNORMAL HIGH (ref <0.031)
Troponin I: 0.14 ng/mL — ABNORMAL HIGH (ref <0.031)

## 2015-07-17 LAB — CBG MONITORING, ED: GLUCOSE-CAPILLARY: 83 mg/dL (ref 65–99)

## 2015-07-17 MED ORDER — SENNOSIDES-DOCUSATE SODIUM 8.6-50 MG PO TABS
1.0000 | ORAL_TABLET | Freq: Every evening | ORAL | Status: DC | PRN
Start: 2015-07-17 — End: 2015-07-18

## 2015-07-17 MED ORDER — ASPIRIN EC 81 MG PO TBEC
81.0000 mg | DELAYED_RELEASE_TABLET | Freq: Every evening | ORAL | Status: DC
  Administered 2015-07-17: 81 mg via ORAL
  Filled 2015-07-17: qty 1

## 2015-07-17 MED ORDER — HEPARIN SODIUM (PORCINE) 1000 UNIT/ML IJ SOLN
INTRAMUSCULAR | Status: AC
  Filled 2015-07-17: qty 7

## 2015-07-17 MED ORDER — PANTOPRAZOLE SODIUM 40 MG PO TBEC
40.0000 mg | DELAYED_RELEASE_TABLET | Freq: Every day | ORAL | Status: DC
  Administered 2015-07-17 – 2015-07-25 (×8): 40 mg via ORAL
  Filled 2015-07-17 (×10): qty 1

## 2015-07-17 MED ORDER — ALTEPLASE 2 MG IJ SOLR
2.0000 mg | Freq: Once | INTRAMUSCULAR | Status: DC | PRN
  Filled 2015-07-17: qty 2

## 2015-07-17 MED ORDER — OXYCODONE-ACETAMINOPHEN 5-325 MG PO TABS
1.0000 | ORAL_TABLET | Freq: Four times a day (QID) | ORAL | Status: DC | PRN
  Administered 2015-07-18: 1 via ORAL
  Filled 2015-07-17: qty 1

## 2015-07-17 MED ORDER — EPOETIN ALFA 10000 UNIT/ML IJ SOLN
10000.0000 [IU] | INTRAMUSCULAR | Status: DC
  Administered 2015-07-17 – 2015-07-24 (×4): 10000 [IU] via INTRAVENOUS

## 2015-07-17 MED ORDER — SODIUM CHLORIDE 0.9 % IV SOLN: 100.0000 mL | INTRAVENOUS | Status: DC | PRN

## 2015-07-17 MED ORDER — EZETIMIBE 10 MG PO TABS
10.0000 mg | ORAL_TABLET | Freq: Every day | ORAL | Status: DC
  Administered 2015-07-17 – 2015-07-24 (×8): 10 mg via ORAL
  Filled 2015-07-17 (×8): qty 1

## 2015-07-17 MED ORDER — CITALOPRAM HYDROBROMIDE 20 MG PO TABS
10.0000 mg | ORAL_TABLET | Freq: Every day | ORAL | Status: DC
  Administered 2015-07-17 – 2015-07-25 (×9): 10 mg via ORAL
  Filled 2015-07-17 (×11): qty 1

## 2015-07-17 MED ORDER — EPOETIN ALFA 10000 UNIT/ML IJ SOLN: 10000.0000 [IU] | INTRAMUSCULAR | Status: DC

## 2015-07-17 MED ORDER — ATORVASTATIN CALCIUM 40 MG PO TABS
80.0000 mg | ORAL_TABLET | Freq: Every day | ORAL | Status: DC
  Administered 2015-07-17 – 2015-07-25 (×8): 80 mg via ORAL
  Filled 2015-07-17 (×8): qty 2

## 2015-07-17 MED ORDER — HEPARIN SODIUM (PORCINE) 1000 UNIT/ML DIALYSIS
1000.0000 [IU] | INTRAMUSCULAR | Status: DC | PRN
  Filled 2015-07-17: qty 1

## 2015-07-17 MED ORDER — AMLODIPINE BESYLATE 5 MG PO TABS: 5.0000 mg | ORAL_TABLET | Freq: Every evening | ORAL | Status: DC

## 2015-07-17 MED ORDER — STROKE: EARLY STAGES OF RECOVERY BOOK
Freq: Once | Status: AC
  Administered 2015-07-22: 12:00:00
  Filled 2015-07-17: qty 1

## 2015-07-17 MED ORDER — ENOXAPARIN SODIUM 30 MG/0.3ML ~~LOC~~ SOLN
30.0000 mg | SUBCUTANEOUS | Status: DC
  Administered 2015-07-18: 30 mg via SUBCUTANEOUS
  Filled 2015-07-17: qty 0.3

## 2015-07-17 MED ORDER — EPOETIN ALFA 10000 UNIT/ML IJ SOLN
INTRAMUSCULAR | Status: AC
  Administered 2015-07-17: 10000 [IU] via INTRAVENOUS
  Filled 2015-07-17: qty 1

## 2015-07-17 MED ORDER — POLYETHYLENE GLYCOL 3350 17 G PO PACK
17.0000 g | PACK | Freq: Every day | ORAL | Status: DC
Start: 2015-07-17 — End: 2015-07-18
  Administered 2015-07-17 – 2015-07-18 (×2): 17 g via ORAL
  Filled 2015-07-17 (×2): qty 1

## 2015-07-17 NOTE — ED Notes (Signed)
Pt is due for dialysis today.  

## 2015-07-17 NOTE — ED Notes (Signed)
Unable to move patient into ED room. Pt still admitted from his surgery yesterday. Registration and secretary are getting in touch with appropriate unit.

## 2015-07-17 NOTE — ED Notes (Addendum)
Per EMS, pt from home. Wife called out for stroke symptoms. LSW was yesterday prior to his outpatient surgery. Wife states that patient appeared to be "out of it" and had difficulty speaking. Wife thought it was due to anesthesia. Also reports lethargy, weakness, aphasia, and a LT side facial droop that was noted this morning. Pt has hx of 2 hemorrhagic strokes and multiple TIA. Pt also hx of STEMI. Pt alert and can follow commands at this time. EMS reports hypotensive 80/60. Pt receiving fluid bolus started by EMS. CBG 207. EMS states that patient has improved.

## 2015-07-17 NOTE — H&P (Signed)
History and Physical  [name] [id number] DOB: Feb 28, 1946 DOA: 07/17/2015  Referring physician: Dr Vanita Panda, ED physician PCP: [name], MD  Outpatient Specialists:   Danie Binder, MD (Gastroenterology)  Serafina Mitchell, MD (Vascular Surgery)  Herminio Commons, MD  (Cardiology)  Fran Lowes, MD  (Nephrology)  Michael Boston, MD  (General Surgery)  Chief Complaint: facial droop  HPI: [name] is a 70 y.o. 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 The patient recently underwent peritoneal dialysis placement by general surgery yesterday. The patient underwent the procedure without any difficulty. The atrial catheter was placed due to end-stage renal disease with underlying venous access. This morning the patient awoke and was noticed to have a left sided facial droop by his wife. Patient was unable to verbalize very well during that time.. When she exited the room and returned, the patient was lying flat on his back and appeared to be diaphoretic and not responding very well. His wife called EMS who brought the patient in his construct. The patient began to recall things after EMS arrived. Patient shows no deficits since being in the emergency department.   Review of Systems:   Pt denies any fevers, chills, nausea, vomiting, diarrhea, constipation, abdominal pain, shortness of breath, dyspnea on exertion, orthopnea, cough, wheezing, palpitations, headache, vision changes, lightheadedness, dizziness, melena, rectal bleeding.  Review of systems are otherwise negative  Past Medical History  Diagnosis Date  . Hypertension   . COPD (chronic obstructive pulmonary disease) ([place])   . Hyperlipidemia   . Cardiomyopathy     EF of 30% per echo in June  of 2012; admitted with congestive heart failure; nonobstructive CAD on cath in 08/2010  . History of noncompliance with medical treatment   . Cerebrovascular disease 2011    CVA  in 02/2010-only deficit is decreased vision in  right eye  . Alcohol abuse     wife states he was never heavy drinker  . Tobacco abuse     40 pack years  . Abdominal aortic aneurysm ([place])     Stent graft repair  . Chronic systolic CHF (congestive heart failure) ([place])   . Leg pain   . CAD (coronary artery disease)   . Myocardial infarction acute ([place]) 10/01/11  . Effusion of right knee joint 12/06/2012  . Anemia   . Atrial fibrillation ([place])   . Ataxic gait   . Weakness generalized   . Frequent falls   . Forgetfulness   . Stroke [facility]) 2011    decreased vision of right eye    Most recent 05/23/14 - bleed - has a drag to his left leg  . Depression   . Anxiety     Takes Citalopram  . Arthritis     knees  . Dementia     early onset  . Enlarged prostate   . GERD (gastroesophageal reflux disease)   . Constipation   . Chronic kidney disease (CKD)     dialysis MWF at [facility] in [place]   Past Surgical History  Procedure Laterality Date  . Colonoscopy w/ polypectomy  2006    Dr. Tamala Julian: anal fissure, sessile adenomatous polyp, diverticulosis  . Esophagogastroduodenoscopy  11/15/2010    Procedure: ESOPHAGOGASTRODUODENOSCOPY (EGD);  Surgeon: Dorothyann Peng, MD;  Location: [facility];  Service: Endoscopy;  Laterality: N/A;  with propofol sedation; procedure start @ 0813  . Flexible sigmoidoscopy  11/15/2010    Procedure: FLEXIBLE SIGMOIDOSCOPY;  Surgeon: Dorothyann Peng, MD;  Location: [facility];  Service: Endoscopy;  Laterality: N/A;  with propofol sedation; ended @ 0808  . Polypectomy  12/13/2010    Procedure: POLYPECTOMY;  Surgeon: Dorothyann Peng, MD;  Location: [facility];  Service: Endoscopy;  Laterality: N/A;  right colon, cecal, transverse, and sigmoid polypectomy  . Cardiac catheterization  10/04/11    Normal left  main, 95% pLAD stenosis with ulceration s/p successful BMS placement, 30% segmental plaque beyond this, dLAD after D2 with 50% plaquing, then focal 70% lesion, 80% focal short D2 stenosis, 30% pOM2 lesion, 30-40% mOM3 stenosis, Shephard's crook region of RCA with 50% lesion, mRCA 50-60% lesion and mild dRCA irregularities.   . Coronary stent placement  10/04/11  . Abdominal aortic aneurysm repair w/ endoluminal graft      01/16/2008  . Left heart catheterization with coronary angiogram N/A 10/04/2011    Procedure: LEFT HEART CATHETERIZATION WITH CORONARY ANGIOGRAM;  Surgeon: Hillary Bow, MD;  Location: [facility] CATH LAB;  Service: Cardiovascular;  Laterality: N/A;  . Av fistula placement Left 07/07/2014    Procedure: Creation of Left Arm ARTERIOVENOUS Fistula;  Surgeon: Rosetta Posner, MD;  Location: [place];  Service: Vascular;  Laterality: Left;  . Insertion of dialysis catheter Right 08/05/2014    Procedure: INSERTION OF DIALYSIS CATHETER Right internal Jugular;  Surgeon: Conrad Grandfalls, MD;  Location: [place];  Service: Vascular;  Laterality: Right;  . Port-a-cath removal    . Peripheral vascular catheterization Left 01/15/2015    Procedure: Fistulagram;  Surgeon: Conrad St. Thomas, MD;  Location: [place] CV LAB;  Service: Cardiovascular;  Laterality: Left;  arm  . Peripheral vascular catheterization Left 01/15/2015    Procedure: Peripheral Vascular Intervention;  Surgeon: Conrad Los Altos, MD;  Location: [place] CV LAB;  Service: Cardiovascular;  Laterality: Left;  ARM VEINOUS PTA  . Insertion of dialysis catheter Left 02/13/2015    Procedure: INSERTION OF DIALYSIS CATHETER AND CENTRAL VENOGRAM;  Surgeon: Serafina Mitchell, MD;  Location: [place];  Service: Vascular;  Laterality: Left;  . Peripheral vascular catheterization N/A 04/07/2015    Procedure: A/V Shuntogram/Fistulagram;  Surgeon: Conrad Triana, MD;  Location: [place] CV LAB;  Service: Cardiovascular;  Laterality: N/A;  . Revison of arteriovenous  fistula Left [id number]    Procedure: PLICATION OF LEFT ARM PSEUDOANEURYSM;  Surgeon: Conrad Nottoway, MD;  Location: [place];  Service: Vascular;  Laterality: Left;  . Patch angioplasty Left 04/09/2015    Procedure: PATCH ANGIOPLASTY;  Surgeon: Conrad Cortland, MD;  Location: [place];  Service: Vascular;  Laterality: Left;  . Ligation of arteriovenous  fistula Left 04/15/2015    Procedure: LIGATION OF BRACHIOCEPHALIC ARTERIOVENOUS  FISTULA;  Surgeon: Mal Misty, MD;  Location: [place];  Service: Vascular;  Laterality: Left;  . Exchange of a dialysis catheter Left 05/14/2015    Procedure: EXCHANGE OF A DIALYSIS CATHETER-LEFT INTERNAL JUGULAR VEIN;  Surgeon: Conrad Frederickson, MD;  Location: [place];  Service: Vascular;  Laterality: Left;  . Removal of a dialysis catheter Left 05/30/2015    Procedure: REMOVAL OF A DIALYSIS CATHETER LEFT INTERNAL JUGULAR;  Surgeon: Conrad , MD;  Location: [place];  Service: Vascular;  Laterality: Left;  . Insertion of dialysis catheter Right 05/30/2015    Procedure: INSERTION OF DIALYSIS CATHETER;  Surgeon: Conrad Nelsonville, MD;  Location: [place];  Service: Vascular;  Laterality: Right;  . Hematoma evacuation Left 05/30/2015    Procedure: EVACUATION NECK HEMATOMA;  Surgeon: Conrad Keller, MD;  Location: [place];  Service: Vascular;  Laterality: Left;  . Capd insertion Bilateral 07/16/2015    Procedure: LAPAROSCOPIC INSERTION CONTINUOUS AMBULATORY PERITONEAL DIALYSIS  (CAPD) CATHETER;  Surgeon: Michael Boston, MD;  Location: [place];  Service: General;  Laterality: Bilateral;  . Inguinal hernia repair Right 07/16/2015    Procedure: LAPAROSCOPIC RIGHT INGUINAL HERNIA WITH MESH;  Surgeon: Michael Boston, MD;  Location: [place];  Service: General;  Laterality: Right;   Social History:  reports that he quit smoking about 2 years ago. His smoking use included Cigarettes. He started smoking about 58 years ago. He has a 25 pack-year smoking history. He has never used smokeless tobacco. He reports that he does  not drink alcohol or use illicit drugs. Patient lives at home with his wife.  No Known Allergies  Family History  Problem Relation Age of Onset  . Colon cancer Neg Hx   . Anesthesia problems Neg Hx   . Hypotension Neg Hx   . Malignant hyperthermia Neg Hx   . Pseudochol deficiency Neg Hx   . Cancer Mother     Gall Bladder  . Heart disease Mother     before age 51  . Hyperlipidemia Mother   . Hypertension Mother   . Asthma Mother   . Cancer Sister     Cervical     Prior to Admission medications   Medication Sig Start Date End Date Taking? Authorizing Provider  amLODipine (NORVASC) 5 MG tablet Take 1 tablet (5 mg total) by mouth daily. Patient taking differently: Take 5 mg by mouth every evening.  04/14/15  Yes Herminio Commons, MD  aspirin EC 81 MG tablet Take 81 mg by mouth every evening.    Yes Historical Provider, MD  b complex-vitamin c-folic acid (NEPHRO-VITE) 0.8 MG TABS tablet Take 1 tablet by mouth daily. 12/15/14  Yes Historical Provider, MD  citalopram (CELEXA) 10 MG tablet TAKE 1 TABLET BY MOUTH EVERY DAY 06/22/15  Yes Kathyrn Drown, MD  ezetimibe (ZETIA) 10 MG tablet TAKE 1 TABLET (10 MG TOTAL) BY MOUTH DAILY. 06/22/15  Yes Herminio Commons, MD  oxyCODONE (OXY IR/ROXICODONE) 5 MG immediate release tablet Take 1-2 tablets (5-10 mg total) by mouth every 6 (six) hours as needed for moderate pain, severe pain or breakthrough pain. 07/16/15  Yes Michael Boston, MD  pantoprazole (PROTONIX) 40 MG tablet TAKE 1 TABLET (40 MG TOTAL) BY MOUTH DAILY. Patient taking differently: TAKE 1 TABLET (40 MG TOTAL) BY MOUTH DAILY. - every evening 03/06/15  Yes Mahala Menghini, PA-C  polyethylene glycol (MIRALAX / GLYCOLAX) packet Take 17 g by mouth daily. 11/29/13  Yes Kathie Dike, MD  atorvastatin (LIPITOR) 80 MG tablet TAKE 1 TABLET (80 MG TOTAL) BY MOUTH DAILY AT 6 PM. 06/30/14   Herminio Commons, MD  oxyCODONE-acetaminophen (PERCOCET/ROXICET) 5-325 MG tablet Take 1 tablet by mouth every  6 (six) hours as needed. 05/30/15   Ulyses Amor, PA-C    Physical Exam: BP 109/78 mmHg  Pulse 90  Temp(Src) 97.8 F (36.6 C) (Oral)  Resp 16  Ht 6\' 3"  (1.905 m)  Wt 72.576 kg (160 lb)  BMI 20.00 kg/m2  SpO2 100%  General: Elderly black male. Awake and alert and oriented x3. No acute cardiopulmonary distress.  HEENT: Normocephalic atraumatic.  Right and left ears normal in appearance.  Pupils equal, round, reactive to light. Extraocular muscles are intact. Sclerae anicteric and noninjected.  Moist mucosal membranes. No mucosal lesions.  Neck: Neck supple without lymphadenopathy. No carotid bruits. No masses palpated.  Cardiovascular: Regular rate with normal S1-S2 sounds. No murmurs, rubs, gallops auscultated. No JVD.  Respiratory: Good respiratory effort with no wheezes, rales, rhonchi. Lungs clear to auscultation bilaterally.  No accessory muscle use. Abdomen: Soft, nontender, nondistended. Active bowel sounds. No masses or hepatosplenomegaly  Skin: No rashes, lesions, or ulcerations.  Dry, warm to touch. 2+ dorsalis pedis and radial pulses. Musculoskeletal: No calf or leg pain. All major joints not erythematous nontender.  No upper or lower joint deformation.  Good ROM.  No contractures  Psychiatric: Intact judgment and insight. Pleasant and cooperative. Neurologic: No focal neurological deficits. Strength is 5/5 and symmetric in upper and lower extremities.  Cranial nerves II through XII are grossly intact. Coordination is intact in upper and lower extremities bilaterally.           Labs on Admission: I have personally reviewed following labs and imaging studies  CBC:  Recent Labs Lab 07/16/15 1200 07/17/15 1145  WBC  --  9.0  HGB 13.6 8.7*  HCT 40.0 26.2*  MCV  --  103.1*  PLT  --  [id number]*   Basic Metabolic Panel:  Recent Labs Lab 07/16/15 1200 07/17/15 1145  NA 138 139  K 4.8 6.0*  CL  --  106  CO2  --  18*  GLUCOSE 82 133*  BUN  --  38*  CREATININE  --   8.35*  CALCIUM  --  8.7*   GFR: Estimated Creatinine Clearance: 8.3 mL/min (by C-G formula based on Cr of 8.35). Liver Function Tests:  Recent Labs Lab 07/17/15 1145  AST 27  ALT 11*  ALKPHOS 67  BILITOT 0.4  PROT 7.0  ALBUMIN 3.6   No results for input(s): LIPASE, AMYLASE in the last 168 hours. No results for input(s): AMMONIA in the last 168 hours. Coagulation Profile:  Recent Labs Lab 07/17/15 1145  INR 1.42   Cardiac Enzymes:  Recent Labs Lab 07/17/15 1145  TROPONINI 0.06*   BNP (last 3 results) No results for input(s): PROBNP in the last 8760 hours. HbA1C: No results for input(s): HGBA1C in the last 72 hours. CBG:  Recent Labs Lab 07/17/15 1129  GLUCAP 83   Lipid Profile: No results for input(s): CHOL, HDL, LDLCALC, TRIG, CHOLHDL, LDLDIRECT in the last 72 hours. Thyroid Function Tests: No results for input(s): TSH, T4TOTAL, FREET4, T3FREE, THYROIDAB in the last 72 hours. Anemia Panel: No results for input(s): VITAMINB12, FOLATE, FERRITIN, TIBC, IRON, RETICCTPCT in the last 72 hours. Urine analysis:    Component Value Date/Time   COLORURINE YELLOW 05/23/2014 2037   APPEARANCEUR CLEAR 05/23/2014 2037   LABSPEC 1.020 05/23/2014 2037   PHURINE 6.5 05/23/2014 2037   GLUCOSEU NEGATIVE 05/23/2014 2037   HGBUR TRACE* 05/23/2014 2037   [place] NEGATIVE 05/23/2014 2037   [place] NEGATIVE 05/23/2014 2037   PROTEINUR >300* 05/23/2014 2037   UROBILINOGEN 0.2 05/23/2014 2037   NITRITE NEGATIVE 05/23/2014 2037   LEUKOCYTESUR NEGATIVE 05/23/2014 2037   Sepsis Labs: @LABRCNTIP (procalcitonin:4,lacticidven:4) )No results found for this or any previous visit (from the past 240 hour(s)).   Radiological Exams on Admission: Ct Head Wo Contrast  07/17/2015  CLINICAL DATA:  Difficulty speaking EXAM: CT HEAD WITHOUT CONTRAST TECHNIQUE: Contiguous axial images were obtained from the base of the skull  through the vertex without intravenous contrast. COMPARISON:   05/04/2015 FINDINGS: Bony calvarium is intact. Diffuse atrophic changes are noted. Chronic white matter ischemic changes are seen as well prior lacunar infarcts are noted within the basal ganglia bilaterally. No acute hemorrhage or acute infarction is noted. IMPRESSION: Chronic atrophic and ischemic changes No acute abnormality noted. Electronically Signed   By: Inez Catalina M.D.   On: 07/17/2015 12:48   Mr Brain Wo Contrast  07/17/2015  CLINICAL DATA:  Left facial droop.  CVA. EXAM: MRI HEAD WITHOUT CONTRAST TECHNIQUE: Multiplanar, multiecho pulse sequences of the brain and surrounding structures were obtained without intravenous contrast. COMPARISON:  CT head 07/17/2015 FINDINGS: Negative for acute infarct. Extensive chronic microvascular ischemic change throughout the white matter extending into the basal ganglia and thalamus bilaterally. Chronic ischemia in the pons bilaterally. Small chronic infarcts in the cerebellum bilaterally. Moderate atrophy.  Negative for hydrocephalus. Chronic hemorrhage in the basal ganglia bilaterally. Chronic hemorrhage in the right pons. This is likely related to chronic poorly controlled hypertension. Negative for mass or edema. Mild mucosal edema paranasal sinuses. Normal orbit bilaterally. Pituitary and skull base normal. IMPRESSION: Negative for acute infarct. Extensive chronic ischemic change. Chronic micro hemorrhage in the basal ganglia bilaterally and right pons likely related to poorly controlled hypertension. Electronically Signed   By: Franchot Gallo M.D.   On: 07/17/2015 14:22    EKG: Independently reviewed. Normal sinus rhythm. No acute ST elevation or depression.  Assessment/Plan: Active Problems:   End-stage renal disease on hemodialysis (HCC)   Hyperkalemia   TIA (transient ischemic attack)   Elevated troponin I level    This patient was discussed with the ED physician, including pertinent vitals, physical exam findings, labs, and imaging.  We also  discussed care given by the ED provider.  #1 TIA  Admit  Consult PT  Echocardiogram in the morning  Continue aspirin #2 hyperkalemia  Nephrology consulted  Dialysis later tonight  Recheck BMP in the morning #3 end-stage renal disease  Dialysis #4 elevated troponin level  Recheck troponin later on this evening #5 coronary artery disease #6 history of stroke  DVT prophylaxis: Lovenox Consultants: Nephrology Code Status: Full code Family Communication: Wife in the room  Disposition Plan: Admission   Truett Mainland, DO [facility] Pager [phone]  If 7PM-7AM, please contact night-coverage [email or web address] Password [email or web address]

## 2015-07-17 NOTE — Progress Notes (Signed)
Notified NP, Pharmacy phoned and states Lovenox is not FDA approved for Dialysis patients. Awaiting response.

## 2015-07-17 NOTE — Consult Note (Addendum)
Reason for Consult: Hyperkalemia and end-stage renal disease Referring Physician: Dr. [name] is an 70 y.o. male.  HPI: He is a patient who has history of hypertension, CVA, coronary artery disease, end-stage renal disease on maintenance hemodialysis presently was brought by ambulance because of weakness, left-sided worsening of partial droop, confusion when he wake up this morning. Most of the story was given by his wife was present at bedside. She states that patient was very weak to stand up. When she was trying to get him up patient fall down but required. His speech was not clear and doesn't know where he was. Slowly patient has improved. Presently he denies any difficulty breathing.  Past Medical History  Diagnosis Date  . Hypertension   . COPD (chronic obstructive pulmonary disease) ([place])   . Hyperlipidemia   . Cardiomyopathy     EF of 30% per echo in June of 2012; admitted with congestive heart failure; nonobstructive CAD on cath in 08/2010  . History of noncompliance with medical treatment   . Cerebrovascular disease 2011    CVA  in 02/2010-only deficit is decreased vision in  right eye  . Alcohol abuse     wife states he was never heavy drinker  . Tobacco abuse     40 pack years  . Abdominal aortic aneurysm ([place])     Stent graft repair  . Chronic systolic CHF (congestive heart failure) ([place])   . Leg pain   . CAD (coronary artery disease)   . Myocardial infarction acute ([place]) 10/01/11  . Effusion of right knee joint 12/06/2012  . Anemia   . Atrial fibrillation ([place])   . Ataxic gait   . Weakness generalized   . Frequent falls   . Forgetfulness   . Stroke [facility]) 2011    decreased vision of right eye    Most recent 05/23/14 - bleed - has a drag to his left leg  . Depression   . Anxiety     Takes Citalopram  . Arthritis     knees  . Dementia     early onset  . Enlarged prostate   . GERD (gastroesophageal reflux disease)   . Constipation   . Chronic  kidney disease (CKD)     dialysis MWF at [facility] in [place]    Past Surgical History  Procedure Laterality Date  . Colonoscopy w/ polypectomy  2006    Dr. Tamala Julian: anal fissure, sessile adenomatous polyp, diverticulosis  . Esophagogastroduodenoscopy  11/15/2010    Procedure: ESOPHAGOGASTRODUODENOSCOPY (EGD);  Surgeon: Dorothyann Peng, MD;  Location: [facility];  Service: Endoscopy;  Laterality: N/A;  with propofol sedation; procedure start @ 0813  . Flexible sigmoidoscopy  11/15/2010    Procedure: FLEXIBLE SIGMOIDOSCOPY;  Surgeon: Dorothyann Peng, MD;  Location: [facility];  Service: Endoscopy;  Laterality: N/A;  with propofol sedation; ended @ 0808  . Polypectomy  12/13/2010    Procedure: POLYPECTOMY;  Surgeon: Dorothyann Peng, MD;  Location: [facility];  Service: Endoscopy;  Laterality: N/A;  right colon, cecal, transverse, and sigmoid polypectomy  . Cardiac catheterization  10/04/11    Normal left main, 95% pLAD stenosis with ulceration s/p successful BMS placement, 30% segmental plaque beyond this, dLAD after D2 with 50% plaquing, then focal 70% lesion, 80% focal short D2 stenosis, 30% pOM2 lesion, 30-40% mOM3 stenosis, Shephard's crook region of RCA with 50% lesion, mRCA 50-60% lesion and mild dRCA irregularities.   . Coronary stent placement  10/04/11  .  Abdominal aortic aneurysm repair w/ endoluminal graft      01/16/2008  . Left heart catheterization with coronary angiogram N/A 10/04/2011    Procedure: LEFT HEART CATHETERIZATION WITH CORONARY ANGIOGRAM;  Surgeon: Hillary Bow, MD;  Location: [facility] CATH LAB;  Service: Cardiovascular;  Laterality: N/A;  . Av fistula placement Left 07/07/2014    Procedure: Creation of Left Arm ARTERIOVENOUS Fistula;  Surgeon: Rosetta Posner, MD;  Location: [place];  Service: Vascular;  Laterality: Left;  . Insertion of dialysis catheter Right 08/05/2014    Procedure: INSERTION OF DIALYSIS CATHETER Right internal Jugular;  Surgeon: Conrad Marceline, MD;  Location: [place];  Service:  Vascular;  Laterality: Right;  . Port-a-cath removal    . Peripheral vascular catheterization Left 01/15/2015    Procedure: Fistulagram;  Surgeon: Conrad [place], MD;  Location: [place] CV LAB;  Service: Cardiovascular;  Laterality: Left;  arm  . Peripheral vascular catheterization Left 01/15/2015    Procedure: Peripheral Vascular Intervention;  Surgeon: Conrad [place], MD;  Location: [place] CV LAB;  Service: Cardiovascular;  Laterality: Left;  ARM VEINOUS PTA  . Insertion of dialysis catheter Left 02/13/2015    Procedure: INSERTION OF DIALYSIS CATHETER AND CENTRAL VENOGRAM;  Surgeon: Serafina Mitchell, MD;  Location: [place];  Service: Vascular;  Laterality: Left;  . Peripheral vascular catheterization N/A 04/07/2015    Procedure: A/V Shuntogram/Fistulagram;  Surgeon: Conrad Worland, MD;  Location: [place] CV LAB;  Service: Cardiovascular;  Laterality: N/A;  . Revison of arteriovenous fistula Left 7/32/2025    Procedure: PLICATION OF LEFT ARM PSEUDOANEURYSM;  Surgeon: Conrad [place], MD;  Location: [place];  Service: Vascular;  Laterality: Left;  . Patch angioplasty Left 04/09/2015    Procedure: PATCH ANGIOPLASTY;  Surgeon: Conrad [place], MD;  Location: [place];  Service: Vascular;  Laterality: Left;  . Ligation of arteriovenous  fistula Left 04/15/2015    Procedure: LIGATION OF BRACHIOCEPHALIC ARTERIOVENOUS  FISTULA;  Surgeon: Mal Misty, MD;  Location: [place];  Service: Vascular;  Laterality: Left;  . Exchange of a dialysis catheter Left 05/14/2015    Procedure: EXCHANGE OF A DIALYSIS CATHETER-LEFT INTERNAL JUGULAR VEIN;  Surgeon: Conrad [place], MD;  Location: [place];  Service: Vascular;  Laterality: Left;  . Removal of a dialysis catheter Left 05/30/2015    Procedure: REMOVAL OF A DIALYSIS CATHETER LEFT INTERNAL JUGULAR;  Surgeon: Conrad [place], MD;  Location: [place];  Service: Vascular;  Laterality: Left;  . Insertion of dialysis catheter Right 05/30/2015    Procedure: INSERTION OF DIALYSIS CATHETER;   Surgeon: Conrad [place], MD;  Location: [place];  Service: Vascular;  Laterality: Right;  . Hematoma evacuation Left 05/30/2015    Procedure: EVACUATION NECK HEMATOMA;  Surgeon: Conrad , MD;  Location: [place];  Service: Vascular;  Laterality: Left;  . Capd insertion Bilateral 07/16/2015    Procedure: LAPAROSCOPIC INSERTION CONTINUOUS AMBULATORY PERITONEAL DIALYSIS  (CAPD) CATHETER;  Surgeon: Michael Boston, MD;  Location: [place];  Service: General;  Laterality: Bilateral;  . Inguinal hernia repair Right 07/16/2015    Procedure: LAPAROSCOPIC RIGHT INGUINAL HERNIA WITH MESH;  Surgeon: Michael Boston, MD;  Location: [place];  Service: General;  Laterality: Right;    Family History  Problem Relation Age of Onset  . Colon cancer Neg Hx   . Anesthesia problems Neg Hx   . Hypotension Neg Hx   . Malignant hyperthermia Neg Hx   . Pseudochol deficiency Neg  Hx   . Cancer Mother     Gall Bladder  . Heart disease Mother     before age 21  . Hyperlipidemia Mother   . Hypertension Mother   . Asthma Mother   . Cancer Sister     Cervical    Social History:  reports that he quit smoking about 2 years ago. His smoking use included Cigarettes. He started smoking about 58 years ago. He has a 25 pack-year smoking history. He has never used smokeless tobacco. He reports that he does not drink alcohol or use illicit drugs.  Allergies: No Known Allergies  Medications: I have reviewed the patient's current medications.  Results for orders placed or performed during the hospital encounter of 07/17/15 (from the past 48 hour(s))  CBG monitoring, ED     Status: None   Collection Time: 07/17/15 11:29 AM  Result Value Ref Range   Glucose-Capillary 83 65 - 99 mg/dL  Comprehensive metabolic panel     Status: Abnormal   Collection Time: 07/17/15 11:45 AM  Result Value Ref Range   Sodium 139 135 - 145 mmol/L   Potassium 6.0 (H) 3.5 - 5.1 mmol/L   Chloride 106 101 - 111 mmol/L   CO2 18 (L) 22 - 32 mmol/L   Glucose,  Bld 133 (H) 65 - 99 mg/dL   BUN 38 (H) 6 - 20 mg/dL   Creatinine, Ser 8.35 (H) 0.61 - 1.24 mg/dL   Calcium 8.7 (L) 8.9 - 10.3 mg/dL   Total Protein 7.0 6.5 - 8.1 g/dL   Albumin 3.6 3.5 - 5.0 g/dL   AST 27 15 - 41 U/L   ALT 11 (L) 17 - 63 U/L   Alkaline Phosphatase 67 38 - 126 U/L   Total Bilirubin 0.4 0.3 - 1.2 mg/dL   GFR calc non Af Amer 6 (L) >60 mL/min   GFR calc Af Amer 7 (L) >60 mL/min    Comment: (NOTE) The eGFR has been calculated using the CKD EPI equation. This calculation has not been validated in all clinical situations. eGFR's persistently <60 mL/min signify possible Chronic Kidney Disease.    Anion gap 15 5 - 15  CBC     Status: Abnormal   Collection Time: 07/17/15 11:45 AM  Result Value Ref Range   WBC 9.0 4.0 - 10.5 K/uL   RBC 2.54 (L) 4.22 - 5.81 MIL/uL   Hemoglobin 8.7 (L) 13.0 - 17.0 g/dL   HCT 26.2 (L) 39.0 - 52.0 %   MCV 103.1 (H) 78.0 - 100.0 fL   MCH 34.3 (H) 26.0 - 34.0 pg   MCHC 33.2 30.0 - 36.0 g/dL   RDW 14.7 11.5 - 15.5 %   Platelets 106 (L) 150 - 400 K/uL    Comment: SPECIMEN CHECKED FOR CLOTS PLATELET COUNT CONFIRMED BY SMEAR   Protime-INR - (order if Patient is taking Coumadin / Warfarin)     Status: Abnormal   Collection Time: 07/17/15 11:45 AM  Result Value Ref Range   Prothrombin Time 17.5 (H) 11.6 - 15.2 seconds   INR 1.42 0.00 - 1.49  Troponin I     Status: Abnormal   Collection Time: 07/17/15 11:45 AM  Result Value Ref Range   Troponin I 0.06 (H) <0.031 ng/mL    Comment:        PERSISTENTLY INCREASED TROPONIN VALUES IN THE RANGE OF 0.04-0.49 ng/mL CAN BE SEEN IN:       -UNSTABLE ANGINA       -CONGESTIVE  HEART FAILURE       -MYOCARDITIS       -CHEST TRAUMA       -ARRYHTHMIAS       -LATE PRESENTING MYOCARDIAL INFARCTION       -COPD   CLINICAL FOLLOW-UP RECOMMENDED.     Ct Head Wo Contrast  07/17/2015  CLINICAL DATA:  Difficulty speaking EXAM: CT HEAD WITHOUT CONTRAST TECHNIQUE: Contiguous axial images were obtained from the  base of the skull through the vertex without intravenous contrast. COMPARISON:  05/04/2015 FINDINGS: Bony calvarium is intact. Diffuse atrophic changes are noted. Chronic white matter ischemic changes are seen as well prior lacunar infarcts are noted within the basal ganglia bilaterally. No acute hemorrhage or acute infarction is noted. IMPRESSION: Chronic atrophic and ischemic changes No acute abnormality noted. Electronically Signed   By: Inez Catalina M.D.   On: 07/17/2015 12:48   Mr Brain Wo Contrast  07/17/2015  CLINICAL DATA:  Left facial droop.  CVA. EXAM: MRI HEAD WITHOUT CONTRAST TECHNIQUE: Multiplanar, multiecho pulse sequences of the brain and surrounding structures were obtained without intravenous contrast. COMPARISON:  CT head 07/17/2015 FINDINGS: Negative for acute infarct. Extensive chronic microvascular ischemic change throughout the white matter extending into the basal ganglia and thalamus bilaterally. Chronic ischemia in the pons bilaterally. Small chronic infarcts in the cerebellum bilaterally. Moderate atrophy.  Negative for hydrocephalus. Chronic hemorrhage in the basal ganglia bilaterally. Chronic hemorrhage in the right pons. This is likely related to chronic poorly controlled hypertension. Negative for mass or edema. Mild mucosal edema paranasal sinuses. Normal orbit bilaterally. Pituitary and skull base normal. IMPRESSION: Negative for acute infarct. Extensive chronic ischemic change. Chronic micro hemorrhage in the basal ganglia bilaterally and right pons likely related to poorly controlled hypertension. Electronically Signed   By: Franchot Gallo M.D.   On: 07/17/2015 14:22    Review of Systems  Constitutional: Positive for malaise/fatigue. Negative for fever.  Respiratory: Negative for shortness of breath.   Cardiovascular: Negative for orthopnea and leg swelling.  Gastrointestinal: Negative for nausea and vomiting.       Patient with PD catheter placed and has dressing no sign  of fresh bleading  Neurological: Positive for dizziness, speech change, focal weakness and weakness.   Blood pressure 111/84, pulse 98, temperature 97.8 F (36.6 C), temperature source Oral, resp. rate 21, height 6' 3"  (1.905 m), weight 72.576 kg (160 lb), SpO2 99 %. Physical Exam  Constitutional: No distress.  HENT:  Patient with left-sided mild fascial droop and loss of nasopharyngeal groove  Eyes: No scleral icterus.  Neck: No JVD present.  Cardiovascular: Normal rate and regular rhythm.   No murmur heard. Respiratory: No respiratory distress. He has no rales.  GI: He exhibits no distension.  Musculoskeletal: He exhibits no edema.  Neurological: He is alert.    Assessment/Plan: Problem #1 history of left-sided CVA: MRI is negative for acute change. Most likely TIA. Patient presently has improved and has returned to his baseline. Problem #2 hyperkalemia: Most likely from high potassium intake Problem #3 end-stage renal disease: He is status post hemodialysis on Wednesday. Presently he denies any uremic signs and symptoms. Problem #4 hypertension: His blood pressure is reasonably controlled Problem #5 anemia: His hemoglobin has declined significantly from yesterday. Not sure whether patient has lost any significant blood when his Peritoneal catheter was placed yesterday Problem #6 fluid management: Patient does not have any sign of fluid overload. Problem #7 history of CVA Problem #8 history of metabolic bone disease: His calcium  is in range. Plan:1] We'll make arrangements for patient to get dialysis today for 3-1/2 hours 2] was use 2K/2.5 calcium bath and removed 2 L 3] will use Epogen 10,000 units IV after each dialysis 4] we'll hold heparin 5] we'll check renal panel and CBC in the morning  [name] S 07/17/2015, 3:31 PM

## 2015-07-17 NOTE — ED Notes (Signed)
Pt's wife arrived and is at bedside. 

## 2015-07-17 NOTE — Procedures (Signed)
   HEMODIALYSIS TREATMENT NOTE:  3.5 hour heparin-free dialysis ordered.  Pt presented to HD unit with pre-dialysis BP 95/55.  HD initiated without problems with goal of 1.5 liters.  BP declined to 86/44 after 15 minutes. Ultrafiltration was discontinued and NS bolus was given, bed reclined to trendelenburg position.  Dialysis continued, without ultrafiltration, and BP was checked and documented at least every 15 minutes. SBP would fluctuate from low 100s to mid 80s, despite no ultrafiltration, trendelenburg position, and frequent normal saline boluses (for circuit patency).  Although pt was asymptomatic, fluctuating BP readings were reported to Dr. Lowanda Foster who ordered discontinuation of dialysis after 2.25 hours.  All blood was reinfused. Pt +1200cc.  Remained in HD unit for 30 minutes after HD with SBP 115-122.  Report called to Stephannie Peters, RN.  Rockwell Alexandria, RN, CDN

## 2015-07-17 NOTE — ED Notes (Signed)
Dr. Vanita Panda at bedside updating patient and family.

## 2015-07-17 NOTE — ED Notes (Signed)
Phlebotomy at bedside.

## 2015-07-17 NOTE — ED Provider Notes (Signed)
CSN: [id number]     Arrival date & time 07/17/15  1038 History  By signing my name below, I, Nicole Kindred, attest that this documentation has been prepared under the direction and in the presence of Carmin Muskrat, MD.   Electronically Signed: Nicole Kindred, ED Scribe. 07/17/2015. 12:13 PM   Chief Complaint  Patient presents with  . Altered Mental Status    The history is provided by the spouse. No language interpreter was used.   HPI Comments: CASHEN KURYLO is a 70 y.o. male with PMHx of 2 strokes, multiple TIA, and STEMI who presents to the Emergency Department via EMS complaining of gradual onset, altered mental status, onset yesterday. Pt had catheter for dialysis placed in his leg yesterday. Wife states that he appeared "out of it" after the procedure but she believed it was from the anesthesia. She reports this morning he presented with worsening left sided facial droop, worse than his baseline droop for previous strokes. She also noticed right leg and arm weakness, difficulty speaking, fatigue, and headache. Pt reports he has had abdominal pain and "soreness" since his surgery. He also notes difficulty speaking. No other associated symptoms noted. No worsening or alleviating factors noted. Pt denies any other pertinent symptoms. Pt had last dialysis treatment on 07/15/2015.   Past Medical History  Diagnosis Date  . Hypertension   . COPD (chronic obstructive pulmonary disease) ([place])   . Hyperlipidemia   . Cardiomyopathy     EF of 30% per echo in June of 2012; admitted with congestive heart failure; nonobstructive CAD on cath in 08/2010  . History of noncompliance with medical treatment   . Cerebrovascular disease 2011    CVA  in 02/2010-only deficit is decreased vision in  right eye  . Alcohol abuse     wife states he was never heavy drinker  . Tobacco abuse     40 pack years  . Abdominal aortic aneurysm ([place])     Stent graft repair  . Chronic systolic CHF (congestive  heart failure) ([place])   . Leg pain   . CAD (coronary artery disease)   . Myocardial infarction acute ([place]) 10/01/11  . Effusion of right knee joint 12/06/2012  . Anemia   . Atrial fibrillation ([place])   . Ataxic gait   . Weakness generalized   . Frequent falls   . Forgetfulness   . Stroke [facility]) 2011    decreased vision of right eye    Most recent 05/23/14 - bleed - has a drag to his left leg  . Depression   . Anxiety     Takes Citalopram  . Arthritis     knees  . Dementia     early onset  . Enlarged prostate   . GERD (gastroesophageal reflux disease)   . Constipation   . Chronic kidney disease (CKD)     dialysis MWF at [facility] in [place]   Past Surgical History  Procedure Laterality Date  . Colonoscopy w/ polypectomy  2006    Dr. Tamala Julian: anal fissure, sessile adenomatous polyp, diverticulosis  . Esophagogastroduodenoscopy  11/15/2010    Procedure: ESOPHAGOGASTRODUODENOSCOPY (EGD);  Surgeon: Dorothyann Peng, MD;  Location: [facility];  Service: Endoscopy;  Laterality: N/A;  with propofol sedation; procedure start @ 0813  . Flexible sigmoidoscopy  11/15/2010    Procedure: FLEXIBLE SIGMOIDOSCOPY;  Surgeon: Dorothyann Peng, MD;  Location: [facility];  Service: Endoscopy;  Laterality: N/A;  with propofol sedation; ended @ 0808  . Polypectomy  12/13/2010    Procedure: POLYPECTOMY;  Surgeon: Dorothyann Peng, MD;  Location: [facility];  Service: Endoscopy;  Laterality: N/A;  right colon, cecal, transverse, and sigmoid polypectomy  . Cardiac catheterization  10/04/11    Normal left main, 95% pLAD stenosis with ulceration s/p successful BMS placement, 30% segmental plaque beyond this, dLAD after D2 with 50% plaquing, then focal 70% lesion, 80% focal short D2 stenosis, 30% pOM2 lesion, 30-40% mOM3 stenosis, Shephard's crook region of RCA with 50% lesion, mRCA 50-60% lesion and mild dRCA irregularities.   . Coronary stent placement  10/04/11  . Abdominal aortic aneurysm repair w/ endoluminal graft      01/16/2008   . Left heart catheterization with coronary angiogram N/A 10/04/2011    Procedure: LEFT HEART CATHETERIZATION WITH CORONARY ANGIOGRAM;  Surgeon: Hillary Bow, MD;  Location[facility] C[facility] LAB;  Service: Cardiovascular;  Laterality: N/A;  . Av fistula placement Left 07/07/2014    Procedure: Creation of Left Arm ARTERIOVENOUS Fistula;  Surgeon: Rosetta Posner, MD;  Location: [place];  Service: Vascular;  Laterality: Left;  . Insertion of dialysis catheter Right 08/05/2014    Procedure: INSERTION OF DIALYSIS CATHETER Right internal Jugular;  Surgeon: Conrad Oakdale, MD;  Location: [place];  Service: Vascular;  Laterality: Right;  . Port-a-cath removal    . Peripheral vascular catheterization Left 01/15/2015    Procedure: Fistulagram;  Surgeon: Conrad Navarre Beach, MD;  Location: [place] CV LAB;  Service: Cardiovascular;  Laterality: Left;  arm  . Peripheral vascular catheterization Left 01/15/2015    Procedure: Peripheral Vascular Intervention;  Surgeon: Conrad [place] Mesa, MD;  Location: [place] CV LAB;  Service: Cardiovascular;  Laterality: Left;  ARM VEINOUS PTA  . Insertion of dialysis catheter Left 02/13/2015    Procedure: INSERTION OF DIALYSIS CATHETER AND CENTRAL VENOGRAM;  Surgeon: Serafina Mitchell, MD;  Location: [place];  Service: Vascular;  Laterality: Left;  . Peripheral vascular catheterization N/A 04/07/2015    Procedure: A/V Shuntogram/Fistulagram;  Surgeon: Conrad Enon, MD;  Location: [place] CV LAB;  Service: Cardiovascular;  Laterality: N/A;  . Revison of arteriovenous fistula Left [id number]    Procedure: PLICATION OF LEFT ARM PSEUDOANEURYSM;  Surgeon: Conrad Harmony, MD;  Location: [place];  Service: Vascular;  Laterality: Left;  . Patch angioplasty Left 04/09/2015    Procedure: PATCH ANGIOPLASTY;  Surgeon: Conrad [place], MD;  Location: [place];  Service: Vascular;  Laterality: Left;  . Ligation of arteriovenous  fistula Left 04/15/2015    Procedure: LIGATION OF BRACHIOCEPHALIC ARTERIOVENOUS  FISTULA;   Surgeon: Mal Misty, MD;  Location: [place];  Service: Vascular;  Laterality: Left;  . Exchange of a dialysis catheter Left 05/14/2015    Procedure: EXCHANGE OF A DIALYSIS CATHETER-LEFT INTERNAL JUGULAR VEIN;  Surgeon: Conrad Mauston, MD;  Location: [place];  Service: Vascular;  Laterality: Left;  . Removal of a dialysis catheter Left 05/30/2015    Procedure: REMOVAL OF A DIALYSIS CATHETER LEFT INTERNAL JUGULAR;  Surgeon: Conrad Taft Mosswood, MD;  Location: [place];  Service: Vascular;  Laterality: Left;  . Insertion of dialysis catheter Right 05/30/2015    Procedure: INSERTION OF DIALYSIS CATHETER;  Surgeon: Conrad Pineville, MD;  Location: [place];  Service: Vascular;  Laterality: Right;  . Hematoma evacuation Left 05/30/2015    Procedure: EVACUATION NECK HEMATOMA;  Surgeon: Conrad McLouth, MD;  Location: [place];  Service: Vascular;  Laterality: Left;   Family History  Problem Relation  Age of Onset  . Colon cancer Neg Hx   . Anesthesia problems Neg Hx   . Hypotension Neg Hx   . Malignant hyperthermia Neg Hx   . Pseudochol deficiency Neg Hx   . Cancer Mother     Gall Bladder  . Heart disease Mother     before age 18  . Hyperlipidemia Mother   . Hypertension Mother   . Asthma Mother   . Cancer Sister     Cervical   Social History  Substance Use Topics  . Smoking status: Former Smoker -- 0.50 packs/day for 50 years    Types: Cigarettes    Start date: 06/25/1957    Quit date: 03/04/2013  . Smokeless tobacco: Never Used  . Alcohol Use: No     Comment: None for several years    Review of Systems  Constitutional:       Per HPI, otherwise negative  HENT:       Per HPI, otherwise negative  Respiratory:       Per HPI, otherwise negative  Cardiovascular:       Per HPI, otherwise negative  Gastrointestinal: Negative for vomiting.  Endocrine:       Negative aside from HPI  Genitourinary:       Neg aside from HPI   Musculoskeletal:       Per HPI, otherwise negative  Skin: Positive for wound.   Allergic/Immunologic: Positive for immunocompromised state.  Neurological: Positive for facial asymmetry, weakness and light-headedness. Negative for syncope.      Allergies  Review of patient's allergies indicates no known allergies.  Home Medications   Prior to Admission medications   Medication Sig Start Date End Date Taking? Authorizing Provider  amLODipine (NORVASC) 5 MG tablet Take 1 tablet (5 mg total) by mouth daily. Patient taking differently: Take 5 mg by mouth every evening.  04/14/15  Yes Herminio Commons, MD  aspirin EC 81 MG tablet Take 81 mg by mouth every evening.    Yes Historical Provider, MD  b complex-vitamin c-folic acid (NEPHRO-VITE) 0.8 MG TABS tablet Take 1 tablet by mouth daily. 12/15/14  Yes Historical Provider, MD  citalopram (CELEXA) 10 MG tablet TAKE 1 TABLET BY MOUTH EVERY DAY 06/22/15  Yes Kathyrn Drown, MD  ezetimibe (ZETIA) 10 MG tablet TAKE 1 TABLET (10 MG TOTAL) BY MOUTH DAILY. 06/22/15  Yes Herminio Commons, MD  oxyCODONE (OXY IR/ROXICODONE) 5 MG immediate release tablet Take 1-2 tablets (5-10 mg total) by mouth every 6 (six) hours as needed for moderate pain, severe pain or breakthrough pain. 07/16/15  Yes Michael Boston, MD  pantoprazole (PROTONIX) 40 MG tablet TAKE 1 TABLET (40 MG TOTAL) BY MOUTH DAILY. Patient taking differently: TAKE 1 TABLET (40 MG TOTAL) BY MOUTH DAILY. - every evening 03/06/15  Yes Mahala Menghini, PA-C  polyethylene glycol (MIRALAX / GLYCOLAX) packet Take 17 g by mouth daily. 11/29/13  Yes Kathie Dike, MD  atorvastatin (LIPITOR) 80 MG tablet TAKE 1 TABLET (80 MG TOTAL) BY MOUTH DAILY AT 6 PM. 06/30/14   Herminio Commons, MD  oxyCODONE-acetaminophen (PERCOCET/ROXICET) 5-325 MG tablet Take 1 tablet by mouth every 6 (six) hours as needed. 05/30/15   Ulyses Amor, PA-C   BP 114/75 mmHg  Pulse 81  Temp(Src) 97.8 F (36.6 C) (Oral)  Resp 15  Ht 6\' 3"  (1.905 m)  Wt 160 lb (72.576 kg)  BMI 20.00 kg/m2  SpO2 100% Physical  Exam  Constitutional: He is oriented to  person, place, and time. He has a sickly appearance. No distress.  HENT:  Head: Normocephalic and atraumatic.  Eyes: Conjunctivae and EOM are normal.  Cardiovascular: Normal rate and regular rhythm.   Pulmonary/Chest: Effort normal. No stridor. No respiratory distress.  Abdominal: He exhibits no distension.    Musculoskeletal: He exhibits no edema.  Neurological: He is alert and oriented to person, place, and time. He displays atrophy. He displays no tremor. A cranial nerve deficit is present. He exhibits abnormal muscle tone. He displays no seizure activity.  Left-sided facial asymmetry, with loss of nasolabial fold.  Patient has equal strength bilaterally, leg strength bilaterally  Skin: Skin is warm and dry.  Psychiatric: He has a normal mood and affect.  Nursing note and vitals reviewed.   ED Course  Procedures (including critical care time) DIAGNOSTIC STUDIES: Oxygen Saturation is 100% on RA, normal by my interpretation.    COORDINATION OF CARE: 12:12 PM-Discussed treatment plan which includes Ct head without contrast, troponin I , CMP, CBC, and EKG with pt at bedside and pt agreed to plan.   Labs Review Labs Reviewed  COMPREHENSIVE METABOLIC PANEL - Abnormal; Notable for the following:    Potassium 6.0 (*)    CO2 18 (*)    Glucose, Bld 133 (*)    BUN 38 (*)    Creatinine, Ser 8.35 (*)    Calcium 8.7 (*)    ALT 11 (*)    GFR calc non Af Amer 6 (*)    GFR calc Af Amer 7 (*)    All other components within normal limits  CBC - Abnormal; Notable for the following:    RBC 2.54 (*)    Hemoglobin 8.7 (*)    HCT 26.2 (*)    MCV 103.1 (*)    MCH 34.3 (*)    Platelets 106 (*)    All other components within normal limits  PROTIME-INR - Abnormal; Notable for the following:    Prothrombin Time 17.5 (*)    All other components within normal limits  TROPONIN I - Abnormal; Notable for the following:    Troponin I 0.06 (*)    All other  components within normal limits  CBG MONITORING, ED    Imaging Review Ct Head Wo Contrast  07/17/2015  CLINICAL DATA:  Difficulty speaking EXAM: CT HEAD WITHOUT CONTRAST TECHNIQUE: Contiguous axial images were obtained from the base of the skull through the vertex without intravenous contrast. COMPARISON:  05/04/2015 FINDINGS: Bony calvarium is intact. Diffuse atrophic changes are noted. Chronic white matter ischemic changes are seen as well prior lacunar infarcts are noted within the basal ganglia bilaterally. No acute hemorrhage or acute infarction is noted. IMPRESSION: Chronic atrophic and ischemic changes No acute abnormality noted. Electronically Signed   By: Inez Catalina M.D.   On: 07/17/2015 12:48   Mr Brain Wo Contrast  07/17/2015  CLINICAL DATA:  Left facial droop.  CVA. EXAM: MRI HEAD WITHOUT CONTRAST TECHNIQUE: Multiplanar, multiecho pulse sequences of the brain and surrounding structures were obtained without intravenous contrast. COMPARISON:  CT head 07/17/2015 FINDINGS: Negative for acute infarct. Extensive chronic microvascular ischemic change throughout the white matter extending into the basal ganglia and thalamus bilaterally. Chronic ischemia in the pons bilaterally. Small chronic infarcts in the cerebellum bilaterally. Moderate atrophy.  Negative for hydrocephalus. Chronic hemorrhage in the basal ganglia bilaterally. Chronic hemorrhage in the right pons. This is likely related to chronic poorly controlled hypertension. Negative for mass or edema. Mild mucosal edema paranasal sinuses.  Normal orbit bilaterally. Pituitary and skull base normal. IMPRESSION: Negative for acute infarct. Extensive chronic ischemic change. Chronic micro hemorrhage in the basal ganglia bilaterally and right pons likely related to poorly controlled hypertension. Electronically Signed   By: Franchot Gallo M.D.   On: 07/17/2015 14:22   I have personally reviewed and evaluated these images and lab results as part of  my medical decision-making.   EKG Interpretation   Date/Time:  Friday July 17 2015 10:51:22 EDT Ventricular Rate:  80 PR Interval:  201 QRS Duration: 89 QT Interval:  380 QTC Calculation: 438 R Axis:   58 Text Interpretation:  Sinus rhythm Borderline repolarization abnormality T  wave abnormality Abnormal ekg Confirmed by Carmin Muskrat  MD [id number]) on  07/17/2015 12:11:24 PM     Chart review notable for patient's outpatient surgical procedure yesterday, placement of peritoneal dialysis catheter, inguinal hernia repair.  Per report, no complications of the procedure.   Labs notable for hyperkalemia, 8, drop in hemoglobin, likely secondary to the patient's surgery and elevated creatinine. Today was the patient's dialysis today, but he was too weak to have this performed.  1:37 PM I discussed the patient's case w Dr. Lowanda Foster (nephro), patient will be dialized today.  On repeat exam the patient brings in similar condition.  MRI notable for chronic microhemorrhage, no substantial other new changes.  MDM   I personally performed the services described in this documentation, which was scribed in my presence. The recorded information has been reviewed and is accurate.   This elderly male with end-stage renal disease, now presents one day after elective placement of peritoneal dialysis catheter, inguinal hernia repair, now with concern for neurologic changes. Patient's neurologic symptoms have largely improved/resolved prior to my evaluation, though there is some loss of left nasal labial fold. Patient's evaluation included CT, MRI to exclude new stroke. This was notable for micro-hemorrhage, no substantial bleeding. Patient also found to be anemic, likely secondary to surgery, with hyperkalemia. Given the patient's hyperkalemia, need for dialysis, I discussed this case with our nephrology colleagues to arrange for emergent dialysis. I'm suspicious case with our hospice colleagues  for admission.   CRITICAL CARE Performed by: Carmin Muskrat Total critical care time: 35 minutes Critical care time was exclusive of separately billable procedures and treating other patients. Critical care was necessary to treat or prevent imminent or life-threatening deterioration. Critical care was time spent personally by me on the following activities: development of treatment plan with patient and/or surrogate as well as nursing, discussions with consultants, evaluation of patient's response to treatment, examination of patient, obtaining history from patient or surrogate, ordering and performing treatments and interventions, ordering and review of laboratory studies, ordering and review of radiographic studies, pulse oximetry and re-evaluation of patient's condition.    Carmin Muskrat, MD 07/17/15 1435

## 2015-07-17 NOTE — ED Notes (Signed)
Attempted report x1. 

## 2015-07-18 ENCOUNTER — Inpatient Hospital Stay (HOSPITAL_COMMUNITY): Payer: Medicare Other

## 2015-07-18 ENCOUNTER — Observation Stay (HOSPITAL_COMMUNITY): Payer: Medicare Other

## 2015-07-18 DIAGNOSIS — R58 Hemorrhage, not elsewhere classified: Secondary | ICD-10-CM | POA: Diagnosis present

## 2015-07-18 DIAGNOSIS — I132 Hypertensive heart and chronic kidney disease with heart failure and with stage 5 chronic kidney disease, or end stage renal disease: Secondary | ICD-10-CM | POA: Diagnosis present

## 2015-07-18 DIAGNOSIS — E785 Hyperlipidemia, unspecified: Secondary | ICD-10-CM | POA: Diagnosis present

## 2015-07-18 DIAGNOSIS — I429 Cardiomyopathy, unspecified: Secondary | ICD-10-CM | POA: Diagnosis present

## 2015-07-18 DIAGNOSIS — E875 Hyperkalemia: Secondary | ICD-10-CM | POA: Diagnosis not present

## 2015-07-18 DIAGNOSIS — I959 Hypotension, unspecified: Secondary | ICD-10-CM | POA: Diagnosis present

## 2015-07-18 DIAGNOSIS — J690 Pneumonitis due to inhalation of food and vomit: Secondary | ICD-10-CM | POA: Diagnosis present

## 2015-07-18 DIAGNOSIS — R4182 Altered mental status, unspecified: Secondary | ICD-10-CM | POA: Diagnosis present

## 2015-07-18 DIAGNOSIS — I248 Other forms of acute ischemic heart disease: Secondary | ICD-10-CM | POA: Diagnosis present

## 2015-07-18 DIAGNOSIS — I252 Old myocardial infarction: Secondary | ICD-10-CM | POA: Diagnosis not present

## 2015-07-18 DIAGNOSIS — Z8679 Personal history of other diseases of the circulatory system: Secondary | ICD-10-CM | POA: Diagnosis not present

## 2015-07-18 DIAGNOSIS — E8889 Other specified metabolic disorders: Secondary | ICD-10-CM | POA: Diagnosis present

## 2015-07-18 DIAGNOSIS — N186 End stage renal disease: Secondary | ICD-10-CM | POA: Diagnosis present

## 2015-07-18 DIAGNOSIS — Z955 Presence of coronary angioplasty implant and graft: Secondary | ICD-10-CM | POA: Diagnosis not present

## 2015-07-18 DIAGNOSIS — I5022 Chronic systolic (congestive) heart failure: Secondary | ICD-10-CM | POA: Diagnosis present

## 2015-07-18 DIAGNOSIS — D696 Thrombocytopenia, unspecified: Secondary | ICD-10-CM

## 2015-07-18 DIAGNOSIS — R1310 Dysphagia, unspecified: Secondary | ICD-10-CM | POA: Diagnosis present

## 2015-07-18 DIAGNOSIS — D638 Anemia in other chronic diseases classified elsewhere: Secondary | ICD-10-CM | POA: Diagnosis present

## 2015-07-18 DIAGNOSIS — D62 Acute posthemorrhagic anemia: Secondary | ICD-10-CM | POA: Diagnosis not present

## 2015-07-18 DIAGNOSIS — Z8249 Family history of ischemic heart disease and other diseases of the circulatory system: Secondary | ICD-10-CM | POA: Diagnosis not present

## 2015-07-18 DIAGNOSIS — I9589 Other hypotension: Secondary | ICD-10-CM

## 2015-07-18 DIAGNOSIS — D6959 Other secondary thrombocytopenia: Secondary | ICD-10-CM | POA: Diagnosis present

## 2015-07-18 DIAGNOSIS — K5641 Fecal impaction: Secondary | ICD-10-CM | POA: Diagnosis present

## 2015-07-18 DIAGNOSIS — J449 Chronic obstructive pulmonary disease, unspecified: Secondary | ICD-10-CM | POA: Diagnosis present

## 2015-07-18 DIAGNOSIS — H547 Unspecified visual loss: Secondary | ICD-10-CM | POA: Diagnosis present

## 2015-07-18 DIAGNOSIS — Z8673 Personal history of transient ischemic attack (TIA), and cerebral infarction without residual deficits: Secondary | ICD-10-CM | POA: Diagnosis not present

## 2015-07-18 DIAGNOSIS — I4891 Unspecified atrial fibrillation: Secondary | ICD-10-CM | POA: Diagnosis present

## 2015-07-18 DIAGNOSIS — K567 Ileus, unspecified: Secondary | ICD-10-CM | POA: Diagnosis present

## 2015-07-18 DIAGNOSIS — G459 Transient cerebral ischemic attack, unspecified: Secondary | ICD-10-CM | POA: Diagnosis not present

## 2015-07-18 DIAGNOSIS — Z825 Family history of asthma and other chronic lower respiratory diseases: Secondary | ICD-10-CM | POA: Diagnosis not present

## 2015-07-18 DIAGNOSIS — Z87891 Personal history of nicotine dependence: Secondary | ICD-10-CM | POA: Diagnosis not present

## 2015-07-18 DIAGNOSIS — I2510 Atherosclerotic heart disease of native coronary artery without angina pectoris: Secondary | ICD-10-CM | POA: Diagnosis present

## 2015-07-18 DIAGNOSIS — K219 Gastro-esophageal reflux disease without esophagitis: Secondary | ICD-10-CM | POA: Diagnosis present

## 2015-07-18 DIAGNOSIS — Z992 Dependence on renal dialysis: Secondary | ICD-10-CM | POA: Diagnosis not present

## 2015-07-18 DIAGNOSIS — Z809 Family history of malignant neoplasm, unspecified: Secondary | ICD-10-CM | POA: Diagnosis not present

## 2015-07-18 DIAGNOSIS — J9601 Acute respiratory failure with hypoxia: Secondary | ICD-10-CM | POA: Diagnosis present

## 2015-07-18 DIAGNOSIS — D689 Coagulation defect, unspecified: Secondary | ICD-10-CM | POA: Diagnosis not present

## 2015-07-18 DIAGNOSIS — K661 Hemoperitoneum: Secondary | ICD-10-CM | POA: Diagnosis present

## 2015-07-18 DIAGNOSIS — F0150 Vascular dementia without behavioral disturbance: Secondary | ICD-10-CM | POA: Diagnosis present

## 2015-07-18 LAB — TROPONIN I
TROPONIN I: 0.19 ng/mL — AB (ref <0.031)
TROPONIN I: 0.18 ng/mL — AB (ref <0.031)
Troponin I: 0.18 ng/mL — ABNORMAL HIGH (ref <0.031)

## 2015-07-18 LAB — PROTIME-INR
INR: 1.5 — AB (ref 0.00–1.49)
PROTHROMBIN TIME: 18.2 s — AB (ref 11.6–15.2)

## 2015-07-18 LAB — RENAL FUNCTION PANEL
ALBUMIN: 3.3 g/dL — AB (ref 3.5–5.0)
ANION GAP: 15 (ref 5–15)
BUN: 31 mg/dL — ABNORMAL HIGH (ref 6–20)
CALCIUM: 8.6 mg/dL — AB (ref 8.9–10.3)
CO2: 21 mmol/L — ABNORMAL LOW (ref 22–32)
Chloride: 101 mmol/L (ref 101–111)
Creatinine, Ser: 6.86 mg/dL — ABNORMAL HIGH (ref 0.61–1.24)
GFR calc non Af Amer: 7 mL/min — ABNORMAL LOW (ref >60)
GFR, EST AFRICAN AMERICAN: 8 mL/min — AB (ref >60)
Glucose, Bld: 142 mg/dL — ABNORMAL HIGH (ref 65–99)
PHOSPHORUS: 5.9 mg/dL — AB (ref 2.5–4.6)
Potassium: 5 mmol/L (ref 3.5–5.1)
SODIUM: 137 mmol/L (ref 135–145)

## 2015-07-18 LAB — CBC
HCT: 19.4 % — ABNORMAL LOW (ref 39.0–52.0)
HEMATOCRIT: 24.9 % — AB (ref 39.0–52.0)
HEMOGLOBIN: 6.6 g/dL — AB (ref 13.0–17.0)
Hemoglobin: 8.6 g/dL — ABNORMAL LOW (ref 13.0–17.0)
MCH: 34.2 pg — ABNORMAL HIGH (ref 26.0–34.0)
MCH: 32.7 pg (ref 26.0–34.0)
MCHC: 34 g/dL (ref 30.0–36.0)
MCHC: 34.5 g/dL (ref 30.0–36.0)
MCV: 100.5 fL — ABNORMAL HIGH (ref 78.0–100.0)
MCV: 94.7 fL (ref 78.0–100.0)
PLATELETS: 103 10*3/uL — AB (ref 150–400)
Platelets: 107 10*3/uL — ABNORMAL LOW (ref 150–400)
RBC: 1.93 MIL/uL — AB (ref 4.22–5.81)
RBC: 2.63 MIL/uL — ABNORMAL LOW (ref 4.22–5.81)
RDW: 15.1 % (ref 11.5–15.5)
RDW: 17.4 % — AB (ref 11.5–15.5)
WBC: 8.4 10*3/uL (ref 4.0–10.5)
WBC: 24.5 10*3/uL — AB (ref 4.0–10.5)

## 2015-07-18 LAB — PREPARE RBC (CROSSMATCH)

## 2015-07-18 LAB — BASIC METABOLIC PANEL
ANION GAP: 15 (ref 5–15)
BUN: 32 mg/dL — ABNORMAL HIGH (ref 6–20)
CALCIUM: 8.6 mg/dL — AB (ref 8.9–10.3)
CHLORIDE: 101 mmol/L (ref 101–111)
CO2: 21 mmol/L — AB (ref 22–32)
Creatinine, Ser: 6.79 mg/dL — ABNORMAL HIGH (ref 0.61–1.24)
GFR calc non Af Amer: 7 mL/min — ABNORMAL LOW (ref >60)
GFR, EST AFRICAN AMERICAN: 8 mL/min — AB (ref >60)
Glucose, Bld: 144 mg/dL — ABNORMAL HIGH (ref 65–99)
POTASSIUM: 5 mmol/L (ref 3.5–5.1)
Sodium: 137 mmol/L (ref 135–145)

## 2015-07-18 LAB — RETICULOCYTES
RBC.: 1.96 MIL/uL — ABNORMAL LOW (ref 4.22–5.81)
RETIC CT PCT: 4 % — AB (ref 0.4–3.1)
Retic Count, Absolute: 78.4 10*3/uL (ref 19.0–186.0)

## 2015-07-18 LAB — LIPID PANEL
CHOL/HDL RATIO: 3 ratio
CHOLESTEROL: 97 mg/dL (ref 0–200)
HDL: 32 mg/dL — AB (ref >40)
LDL Cholesterol: 52 mg/dL (ref 0–99)
Triglycerides: 67 mg/dL (ref <150)
VLDL: 13 mg/dL (ref 0–40)

## 2015-07-18 LAB — VITAMIN B12: Vitamin B-12: 559 pg/mL (ref 180–914)

## 2015-07-18 LAB — IRON AND TIBC
Iron: 37 ug/dL — ABNORMAL LOW (ref 45–182)
SATURATION RATIOS: 20 % (ref 17.9–39.5)
TIBC: 182 ug/dL — ABNORMAL LOW (ref 250–450)
UIBC: 145 ug/dL

## 2015-07-18 LAB — MRSA PCR SCREENING: MRSA BY PCR: NEGATIVE

## 2015-07-18 LAB — ECHOCARDIOGRAM COMPLETE
Height: 75 in
Weight: 2560 oz

## 2015-07-18 LAB — FOLATE: FOLATE: 19.8 ng/mL (ref >5.9)

## 2015-07-18 LAB — HEPATITIS B SURFACE ANTIGEN: HEP B S AG: NEGATIVE

## 2015-07-18 LAB — FERRITIN: Ferritin: 316 ng/mL (ref 24–336)

## 2015-07-18 MED ORDER — LIP MEDEX EX OINT: 1.0000 "application " | TOPICAL_OINTMENT | Freq: Two times a day (BID) | CUTANEOUS | Status: DC

## 2015-07-18 MED ORDER — DIPHENHYDRAMINE HCL 25 MG PO CAPS
25.0000 mg | ORAL_CAPSULE | Freq: Once | ORAL | Status: AC
  Administered 2015-07-18: 25 mg via ORAL
  Filled 2015-07-18: qty 1

## 2015-07-18 MED ORDER — MENTHOL 3 MG MT LOZG: 1.0000 | LOZENGE | OROMUCOSAL | Status: DC | PRN

## 2015-07-18 MED ORDER — NEPRO/CARBSTEADY PO LIQD
237.0000 mL | Freq: Two times a day (BID) | ORAL | Status: DC
  Administered 2015-07-19 – 2015-07-25 (×10): 237 mL via ORAL

## 2015-07-18 MED ORDER — SODIUM CHLORIDE 0.9 % IV SOLN
Freq: Once | INTRAVENOUS | Status: DC
Start: 2015-07-18 — End: 2015-07-25

## 2015-07-18 MED ORDER — ACETAMINOPHEN 650 MG RE SUPP: 650.0000 mg | Freq: Four times a day (QID) | RECTAL | Status: DC | PRN

## 2015-07-18 MED ORDER — ACETAMINOPHEN 325 MG PO TABS
325.0000 mg | ORAL_TABLET | Freq: Four times a day (QID) | ORAL | Status: DC | PRN
  Administered 2015-07-19 – 2015-07-22 (×2): 650 mg via ORAL
  Filled 2015-07-18 (×2): qty 2

## 2015-07-18 MED ORDER — WHITE PETROLATUM GEL
Freq: Two times a day (BID) | Status: DC
  Administered 2015-07-18: 13:00:00 via TOPICAL
  Administered 2015-07-18 – 2015-07-21 (×6): 0.2 via TOPICAL
  Administered 2015-07-21: 1 via TOPICAL
  Administered 2015-07-22 – 2015-07-23 (×2): via TOPICAL
  Administered 2015-07-23 – 2015-07-24 (×2): 0.2 via TOPICAL
  Administered 2015-07-24: 22:00:00 via TOPICAL
  Filled 2015-07-18 (×5): qty 28.35

## 2015-07-18 MED ORDER — DEXTROSE-NACL 5-0.9 % IV SOLN
INTRAVENOUS | Status: DC
  Administered 2015-07-18 – 2015-07-19 (×2): via INTRAVENOUS

## 2015-07-18 MED ORDER — PHENOL 1.4 % MT LIQD
2.0000 | OROMUCOSAL | Status: DC | PRN
  Filled 2015-07-18: qty 177

## 2015-07-18 MED ORDER — ACETAMINOPHEN 325 MG PO TABS
650.0000 mg | ORAL_TABLET | Freq: Once | ORAL | Status: AC
  Administered 2015-07-18: 650 mg via ORAL
  Filled 2015-07-18: qty 2

## 2015-07-18 MED ORDER — SODIUM CHLORIDE 0.9 % IV SOLN
Freq: Once | INTRAVENOUS | Status: AC
  Administered 2015-07-18: 13:00:00 via INTRAVENOUS

## 2015-07-18 MED ORDER — BISACODYL 10 MG RE SUPP
10.0000 mg | Freq: Two times a day (BID) | RECTAL | Status: DC | PRN
Start: 2015-07-18 — End: 2015-07-25

## 2015-07-18 MED ORDER — MAGIC MOUTHWASH: 15.0000 mL | Freq: Four times a day (QID) | ORAL | Status: DC | PRN

## 2015-07-18 MED ORDER — POLYETHYLENE GLYCOL 3350 17 G PO PACK
17.0000 g | PACK | Freq: Two times a day (BID) | ORAL | Status: DC
  Administered 2015-07-18 – 2015-07-20 (×5): 17 g via ORAL
  Filled 2015-07-18 (×5): qty 1

## 2015-07-18 MED ORDER — FUROSEMIDE 10 MG/ML IJ SOLN: 40.0000 mg | Freq: Once | INTRAMUSCULAR | Status: DC

## 2015-07-18 MED ORDER — SODIUM CHLORIDE 0.9 % IV SOLN
3.0000 g | INTRAVENOUS | Status: DC
  Administered 2015-07-18 – 2015-07-24 (×7): 3 g via INTRAVENOUS
  Filled 2015-07-18 (×10): qty 3

## 2015-07-18 NOTE — Progress Notes (Addendum)
CRITICAL VALUE ALERT  Critical value received:  Hemoglobin 6.6  Date of notification: 07/18/2015  Time of notification:  04:15  Critical value read back: yes  Nurse who received alert:  Bree  MD notified (1st page):  Dr. Marin Comment  Time of first page:  04:16  MD notified (2nd page):  Time of second page:  Responding MD:  Dr. Marin Comment  Time MD responded:  04:19

## 2015-07-18 NOTE — Anesthesia Postprocedure Evaluation (Signed)
Anesthesia Post Note  Patient: TREYSHAUN HASBUN  Procedure(s) Performed: Procedure(s) (LRB): LAPAROSCOPIC INSERTION CONTINUOUS AMBULATORY PERITONEAL DIALYSIS  (CAPD) CATHETER (Bilateral) LAPAROSCOPIC RIGHT INGUINAL HERNIA WITH MESH (Right)  Patient location during evaluation: PACU Anesthesia Type: General Level of consciousness: awake, awake and alert and oriented Pain management: pain level controlled Vital Signs Assessment: post-procedure vital signs reviewed and stable Respiratory status: spontaneous breathing, nonlabored ventilation and respiratory function stable Cardiovascular status: blood pressure returned to baseline Anesthetic complications: no    Last Vitals:  Filed Vitals:   07/16/15 1658 07/16/15 1700  BP: 143/92   Pulse:    Temp:  36.5 C  Resp: 24     Last Pain:  Filed Vitals:   07/16/15 1701  PainSc: 0-No pain                 Corrie Reder COKER

## 2015-07-18 NOTE — Progress Notes (Signed)
*  PRELIMINARY RESULTS* Echocardiogram 2D Echocardiogram has been performed.  Leavy Cella 07/18/2015, 10:41 AM

## 2015-07-18 NOTE — Procedures (Signed)
   HEMODIALYSIS TREATMENT NOTE:  3 hour heparin-free dialysis completed via right femoral tunneled HD cath.  Goal met: 1 liter removed, however pt hypotensive throughout HD session.  Minimum ulltrafiltration performed only during and after transfusion of 2 units PRBCs.  Net +768cc.  All blood was returned.  Report given to [place], RN.  (NS 1100cc+ PRBCs 670cc) - (1002cc removed) = +768cc  Rockwell Alexandria, RN, CDN

## 2015-07-18 NOTE — Progress Notes (Signed)
[name]  MRN: [id number]  DOB/AGE: 71/25/1947 71 y.o.  Primary Care Physician:Scott Wolfgang [place], MD  Admit date: 07/17/2015  Chief Complaint:  Chief Complaint  Patient presents with  . Altered Mental Status    S-Pt presented on  07/17/2015 with  Chief Complaint  Patient presents with  . Altered Mental Status  .    Pt offers no new complaints     Meds  .  stroke: mapping our early stages of recovery book   Does not apply Once  . sodium chloride   Intravenous Once  . amLODipine  5 mg Oral QPM  . aspirin EC  81 mg Oral QPM  . atorvastatin  80 mg Oral q1800  . citalopram  10 mg Oral Daily  . enoxaparin (LOVENOX) injection  30 mg Subcutaneous Q24H  . epoetin (EPOGEN/PROCRIT) injection  10,000 Units Intravenous Q M,W,F-HD  . ezetimibe  10 mg Oral QHS  . furosemide  40 mg Intravenous Once  . pantoprazole  40 mg Oral Daily  . polyethylene glycol  17 g Oral Daily      Physical Exam: Vital signs in last 24 hours: Temp:  [97.8 F (36.6 C)-99.2 F (37.3 C)] 99 F (37.2 C) (04/29 0532) Pulse Rate:  [78-110] 105 (04/29 0532) Resp:  [14-23] 16 (04/29 0532) BP: (73-122)/(40-84) 100/62 mmHg (04/29 0532) SpO2:  [91 %-100 %] 97 % (04/29 0532) Weight:  [160 lb (72.576 kg)] 160 lb (72.576 kg) (04/28 1739) Weight change:     Intake/Output from previous day: 04/28 0701 - 04/29 0700 In: -  Out: -1174      Physical Exam: General- pt is awake,alert. Resp- No acute REsp distress, CTA B/L NO Rhonchi CVS- S1S2 regular in rate and rhythm GIT- BS+, soft, ND, PD cath in situ EXT- NO LE Edema, Cyanosis Access- Right thigh PC  Lab Results: CBC  Recent Labs  07/17/15 1145 07/18/15 0324  WBC 9.0 8.4  HGB 8.7* 6.6*  HCT 26.2* 19.4*  PLT 106* 107*    BMET  Recent Labs  07/17/15 1145 07/18/15 0324  NA 139 137  137  K 6.0* 5.0  5.0  CL 106 101  101  CO2 18* 21*  21*  GLUCOSE 133* 142*  144*  BUN 38* 31*  32*  CREATININE 8.35* 6.86*  6.79*  CALCIUM 8.7*  8.6*  8.6*    MICRO No results found for this or any previous visit (from the past 240 hour(s)).    Lab Results  Component Value Date   CALCIUM 8.6* 07/18/2015   CALCIUM 8.6* 07/18/2015   CAION 1.08* 04/07/2015   PHOS 5.9* 07/18/2015        Impression: 1)Renal  ESRD on HD                Pt is on Monday/Wednesday/Friday schedule                Pt was dialyzed yesterday but tx was shortened sec to hypotension                Will dialyze again today  2)HTN  Medication- On Calcium Channel Blockers    3)Anemia HGb not at goal (9--11)   In ESRD the goal for Hgb is 9--11   Pt had nearly 2 grams drop in h/h   CT showed retro peritoneal hematoma      4)CKD Mineral-Bone Disorder  Phosphorus not at goal. Calcium is at goal.( after correcting for low albumin)  5)CNs- admitted with  CVA/TIAPMD following  6)Electrolytes Normokalemic NOrmonatremic    7)Acid base Co2 at goal     Plan:  Will dialyze today Will transfuse while on hd Will use 2 k bath Will d/c calcium channel blockers for now     [place] S 07/18/2015, 7:15 AM

## 2015-07-18 NOTE — Progress Notes (Signed)
Initial Nutrition Assessment  DOCUMENTATION CODES:   Not applicable  INTERVENTION:   Nepro Shake po BID, each supplement provides 425 kcal and 19 grams protein  NUTRITION DIAGNOSIS:   Increased nutrient needs related to chronic illness as evidenced by estimated needs.  GOAL:   Patient will meet greater than or equal to 90% of their needs  MONITOR:   PO intake, Supplement acceptance, Labs, Weight trends, Skin, I & O's  REASON FOR ASSESSMENT:   Malnutrition Screening Tool    ASSESSMENT:   [name] is a 70 y.o. 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 The patient recently underwent peritoneal dialysis placement by general surgery yesterday.   Chart reviewed and assessment completed remotely, due to RD not on site today.   Pt admitted with TIA.   Pt with hx of ESRD. Noted last HD was 07/17/15. Pt recently underwent PD catheter placement.   Per MD notes, STAT abdominal pelvic CT ordered to rule out intra-abdominal bleed. Per MD notes, Hgb dropped to 6.6 last night.   Reviewed RD note from 05/24/15; wt has been stable since previous admission.   Pt with increased nutrient needs due to HD. RD will add Nepro shake for additional nutrition support.   Labs reviewed.   Diet Order:  Diet Heart Room service appropriate?: Yes; Fluid consistency:: Thin  Skin:  Reviewed, no issues  Last BM:  PTA  Height:   Ht Readings from Last 1 Encounters:  07/17/15 6\' 3"  (1.905 m)    Weight:   Wt Readings from Last 1 Encounters:  07/17/15 160 lb (72.576 kg)    Ideal Body Weight:  89.1 kg  BMI:  Body mass index is 20 kg/(m^2).  Estimated Nutritional Needs:   Kcal:  2100-2300  Protein:  95-110 grams  Fluid:  per MD  EDUCATION NEEDS:   No education  needs identified at this time  Terek Bee A. Jimmye Norman, RD, LDN, CDE Pager: [phone] After hours Pager: [phone]

## 2015-07-18 NOTE — Progress Notes (Signed)
NIGHT COVERAGE.  RN called about Hb of 6.6 g/dL.  Yesterday was 8.7 g/dL. He has peritoneal catheter placed yesterday for PD.   There is some dry blood around the dressing, but no active bleeding. His abdomen was soft. His SBP was soft, at 80, but he is not in shock, and mentating appropriately. Will transfuse 2 units of PRBC over 3 hours each, with IV Lasix between the two, and follow up H and H. Obtain STAT abdominal pelvic CT to r/out a intra-abdominal bleed.   Orvan Falconer, MD FACP.  Hospital Medicine.

## 2015-07-18 NOTE — Progress Notes (Signed)
Patient with orders to be transported to stepdown unit. Bed request placed. Patient going to ICU 11. Report called to [place], Therapist, sports. Patient stable at transport.

## 2015-07-18 NOTE — Progress Notes (Signed)
Notified MD of pt's troponin levels rising, awaiting response.

## 2015-07-18 NOTE — Progress Notes (Signed)
Janace Aris, RN stated unit secretary on dept 300 would notify [facility] Peritoneal Dialysis nurse of patient's dialysis catheter and close follow up request from Dr. Johney Maine.

## 2015-07-18 NOTE — Progress Notes (Signed)
PT Cancellation Note  Patient Details Name: GONSALO BARQUIN MRN: [id number] DOB: Jul 06, 1945   Cancelled Treatment:    Reason Eval/Treat Not Completed: Patient not medically ready. Chart reviewed: most recent labs revealing Hb: 6.6, HCT: 19.4, HR: 135 bpm, and up-trending Troponin. These are all contraindications to participation with PT per hospital policy. Will continue to monitor and evaluate once medically stable.    2:47 PM, 07/18/2015 Etta Grandchild, PT, DPT PRN Physical Therapist - [place] License # [id number] [phone] (mobile)

## 2015-07-18 NOTE — Progress Notes (Signed)
MD at bedside. 

## 2015-07-18 NOTE — Progress Notes (Signed)
PROGRESS NOTE    [name]  [id number] DOB: December 01, 1945 DOA: 07/17/2015 PCP: [name], MD Outpatient Specialists:  Danie Binder, MD (Gastroenterology)  Serafina Mitchell, MD (Vascular Surgery)  Herminio Commons, MD (Cardiology)  Fran Lowes, MD (Nephrology)  Michael Boston, MD (General Surgery)    Brief Narrative:  Patient is a 69 year old man with history end-stage renal disease on HD, status post laparoscopic peritoneal dialysis catheter insertion and inguinal hernia repair on the right by Dr. 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 In the ED, he was afebrile and hemodynamically stable. His potassium was elevated at 6.0, BUN 38, CO2 18, and creatinine 8.35. His hemoglobin was 8.7. Troponin I was 0.06. CT of the head was negative for acute infarct, but with extensive chronic ischemic changes and chronic microhemorrhage in the basal ganglia bilaterally and right pons. He was admitted for further evaluation and management of a possible TIA and hyperkalemia.   Assessment & Plan:   Principal Problem:   TIA (transient ischemic attack) Active Problems:   Hyperkalemia   Retroperitoneal hemorrhage   Acute blood loss anemia   Elevated troponin I level   Hypotension   Aspiration pneumonia (HCC)   End-stage renal disease on hemodialysis ([place])   Thrombocytopenia ([place])  We'll transfer the patient to stepdown unit for closer observation.  1. Left facial droop. Patient has a history of CVA and is treated chronically with aspirin, which was continued initially. Diagnostic studies revealed negative acute infarction, but with chronic microhemorrhage in the basal cannula bilaterally and the right pons per  MRI of the brain; no significant ICA stenosis bilaterally per carotid ultrasound; 2-D echocardiogram pending. -Is likely that the patient had a TIA, but facial droop from hyperkalemia may have been contributing. -His facial droop has resolved.  Retroperitoneal hemorrhage. Patient is status post laparoscopic peritoneal dialysis catheter insertion on 07/16/15 by Dr. Johney Maine. On admission, the patient's hemoglobin was 8.7, but he was relatively hypotensive. CT of his abdomen and pelvis was ordered for a drop in his hemoglobin to 6.6. It revealed retroperitoneal hematoma on the right and small volume pneumoperitoneum presumably related to the peritoneal catheter placement. -Findings were discussed with surgeon, Dr. Johney Maine by phone. He reviewed the patient's CT scan. He noted that the hematoma was not large. He recommended transfusion and to continue to monitor the patient here at [facility]. He went on to say that the peritoneal dressing can stay on and that eventually the peritoneal protocol will need to be followed regarding the catheter.  Acute blood loss anemia; history of anemia of chronic disease. Patient's hemoglobin dropped to 6.6 from 8.7. As above, patient has a retroperitoneal hemorrhage. -Aspirin and Lovenox were discontinued. -He will be transfused 2 units of packed blood cells with hemodialysis. -Will monitor his CBC closely, every 6 hours 24-48 hours. We'll continue transfusions to keep his hemoglobin greater than 8.  Thrombocytopenia. Patient's platlet count was 143 on admission. It has fallen to 106. Etiology is likely secondary to consumption thrombocytopenia from the GI bleeding. -We'll continue to monitor closely.  Hypotension. The base of blood pressures were in the [id number] systolically. This is secondary to hypovolemia. He was given IV fluid boluses cautiously with some improvement in his  blood pressure. -Amlodipine was discontinued.  Elevated troponin I. Patient troponin I was 0.06 on  admission. It has ranged from 0.14-0.19. The elevation is thought to be secondary to demand ischemia from hypotension and acute blood loss. He denies chest pain. -Echo ordered and is pending.  Possible aspiration pneumonia. CT scan of the patient's abdomen/pelvis revealed right lower lobe bronchus mucus plugging and aspirated material, query aspiration pneumonitis versus pneumonia. -Given the patient's clinical picture, will order Unasyn for presumed aspiration pneumonia. -Will downgrade his diet to clear liquids. -We'll order speech therapy consultation.  End-stage renal disease with hyperkalemia. Nephrology was consulted. Hemodialysis was attempted on 07/17/15, but had to be canceled due to hypotension. -The plan is to try HD again today. -His potassium level has improved.   DVT prophylaxis: Lovenox>> d/c'd /4/29>> SCDs Code Status: Full code. Family Communication: Discussion with wife pending. Disposition Plan: Discharge when clinically appropriate, likely in a few days.   Consultants:   Nephrology  Phone call conversation with general surgeon, Dr. Johney Maine.  Procedures:   Hemodialysis  2-D echocardiogram 07/18/2015  Antimicrobials:  Unasyn 07/18/15   Subjective: Patient has no complaints of chest pain, shortness of breath, or abdominal pain.  Objective: Filed Vitals:   07/18/15 0321 07/18/15 0532 07/18/15 1113 07/18/15 1142  BP: 73/58 100/62 108/69 108/63  Pulse:  105 84 82  Temp:  99 F (37.2 C) 98 F (36.7 C)   TempSrc:  Oral    Resp:  16 18 18   Height:      Weight:      SpO2:  97% 96% 94%    Intake/Output Summary (Last 24 hours) at 07/18/15 1200 Last data filed at 07/17/15 2020  Gross per 24 hour  Intake      0 ml  Output  -1174 ml  Net   1174 ml   Filed Weights   07/17/15 1046 07/17/15 1739  Weight: 72.576 kg (160 lb) 72.576 kg (160 lb)    Examination:  General exam: Appears calm and comfortable; no acute distress.  Respiratory system: Rare  crackles on the right, otherwise clear; breathing nonlabored.. Cardiovascular system: S1 & S2 heard with a 1 to 2/6 systolic murmur. Trace pedal edema. Gastrointestinal system: Abdomen with postoperative bandage in place, blood tinge; mildly to moderately distended with mild diffuse tenderness; relatively soft; hypoactive bowel sounds. Central nervous system: Alert and oriented to himself and place, but not to year or month. No obvious facial droop or cranial nerve deficits. Extremities: No acute hot red joints. Skin: No rashes, lesions or ulcers Psychiatry: Flat affect. Speech is clear.    Data Reviewed:   CBC:  Recent Labs Lab 07/16/15 1200 07/17/15 1145 07/18/15 0324  WBC  --  9.0 8.4  HGB 13.6 8.7* 6.6*  HCT 40.0 26.2* 19.4*  MCV  --  103.1* 100.5*  PLT  --  106* [id number]*   Basic Metabolic Panel:  Recent Labs Lab 07/16/15 1200 07/17/15 1145 07/18/15 0324  NA 138 139 137  137  K 4.8 6.0* 5.0  5.0  CL  --  106 101  101  CO2  --  18* 21*  21*  GLUCOSE 82 133* 142*  144*  BUN  --  38* 31*  32*  CREATININE  --  8.35* 6.86*  6.79*  CALCIUM  --  8.7* 8.6*  8.6*  PHOS  --   --  5.9*   GFR: Estimated Creatinine Clearance: 10.1 mL/min (by C-G formula based on Cr of 6.86). Liver Function  Tests:  Recent Labs Lab 07/17/15 1145 07/18/15 0324  AST 27  --   ALT 11*  --   ALKPHOS 67  --   BILITOT 0.4  --   PROT 7.0  --   ALBUMIN 3.6 3.3*   No results for input(s): LIPASE, AMYLASE in the last 168 hours. No results for input(s): AMMONIA in the last 168 hours. Coagulation Profile:  Recent Labs Lab 07/17/15 1145  INR 1.42   Cardiac Enzymes:  Recent Labs Lab 07/17/15 1145 07/17/15 2205 07/18/15 0324 07/18/15 0928  TROPONINI 0.06* 0.14* 0.18* 0.19*   BNP (last 3 results) No results for input(s): PROBNP in the last 8760 hours. HbA1C: No results for input(s): HGBA1C in the last 72 hours. CBG:  Recent Labs Lab 07/17/15 1129  GLUCAP 83   Lipid  Profile:  Recent Labs  07/18/15 0324  CHOL 97  HDL 32*  LDLCALC 52  TRIG 67  CHOLHDL 3.0   Thyroid Function Tests: No results for input(s): TSH, T4TOTAL, FREET4, T3FREE, THYROIDAB in the last 72 hours. Anemia Panel:  Recent Labs  07/18/15 0928  RETICCTPCT 4.0*   Urine analysis:    Component Value Date/Time   COLORURINE YELLOW 05/23/2014 2037   APPEARANCEUR CLEAR 05/23/2014 2037   LABSPEC 1.020 05/23/2014 2037   PHURINE 6.5 05/23/2014 2037   GLUCOSEU NEGATIVE 05/23/2014 2037   HGBUR TRACE* 05/23/2014 2037   [place] NEGATIVE 05/23/2014 2037   [place] NEGATIVE 05/23/2014 2037   PROTEINUR >300* 05/23/2014 2037   UROBILINOGEN 0.2 05/23/2014 2037   NITRITE NEGATIVE 05/23/2014 2037   LEUKOCYTESUR NEGATIVE 05/23/2014 2037   Sepsis Labs: @LABRCNTIP (procalcitonin:4,lacticidven:4)  )No results found for this or any previous visit (from the past 240 hour(s)).       Radiology Studies: Ct Abdomen Pelvis Wo Contrast  07/18/2015  CLINICAL DATA:  Anemia, concern for intra-abdominal bleed. Peritoneal catheter placed yesterday. History of abdominal aortic aneurysm EXAM: CT ABDOMEN AND PELVIS WITHOUT CONTRAST TECHNIQUE: Multidetector CT imaging of the abdomen and pelvis was performed following the standard protocol without IV contrast. COMPARISON:  01/01/2015 FINDINGS: Lower chest: There is right lower lobe consolidation and a small right pleural effusion. There is debris in the lower lobe bronchus on the right. Hepatobiliary: There is an approximately 7 mm gallstone. Liver appears normal. Pancreas: No significant pancreatic abnormalities Spleen: The spleen is relatively diminutive but normal Adrenals/Urinary Tract: Adrenal glands are negative. Both kidneys are atrophic. Bladder is decompressed with thick wall. Stomach/Bowel: Fecal impaction in the rectum with distention of the rectum with stool. Nonobstructive gas pattern. Vascular/Lymphatic: Abdominal aortic aneurysm stable with  aortic stent graft. Right iliac artery dilatation also stable. No significant adenopathy. Reproductive: Negative Other: There is a small volume of ascites in the abdomen. New there is a right femoral venous catheter extending through the heart and into the superior vena cava. There is a small volume of pneumoperitoneum in the abdomen and pelvis. This is likely related to the recent placement of left lower quadrant peritoneal catheter. There is hyper attenuating fluid in the right perinephric space extending along the right psoas muscle into the pelvis anteriorly. This continues to the pelvic floor and extends around the right and anterior wall of the bladder with mild mass effect on the bladder. The collection measures 79 x 61 mm at the level of the sacral promontory. In the posterior perinephric space the collection measures 30 x 65 mm. Musculoskeletal: There are no acute musculoskeletal findings. IMPRESSION: 1. Retroperitoneal hematoma on the right 2. Small volume  pneumoperitoneum presumably related to peritoneal catheter placement 3. Fecal impaction in the rectum 4. Aortic stent graft 5. Cholelithiasis 6. Debris in right lower lobe bronchus may represent mucous plugging or aspirated material. Right lower lobe consolidation with pleural effusion. Aspiration pneumonitis is 1 consideration. Other possibilities include pneumonia. Electronically Signed   By: Skipper Cliche M.D.   On: 07/18/2015 09:01   Ct Head Wo Contrast  07/17/2015  CLINICAL DATA:  Difficulty speaking EXAM: CT HEAD WITHOUT CONTRAST TECHNIQUE: Contiguous axial images were obtained from the base of the skull through the vertex without intravenous contrast. COMPARISON:  05/04/2015 FINDINGS: Bony calvarium is intact. Diffuse atrophic changes are noted. Chronic white matter ischemic changes are seen as well prior lacunar infarcts are noted within the basal ganglia bilaterally. No acute hemorrhage or acute infarction is noted. IMPRESSION: Chronic  atrophic and ischemic changes No acute abnormality noted. Electronically Signed   By: Inez Catalina M.D.   On: 07/17/2015 12:48   Mr Brain Wo Contrast  07/17/2015  CLINICAL DATA:  Left facial droop.  CVA. EXAM: MRI HEAD WITHOUT CONTRAST TECHNIQUE: Multiplanar, multiecho pulse sequences of the brain and surrounding structures were obtained without intravenous contrast. COMPARISON:  CT head 07/17/2015 FINDINGS: Negative for acute infarct. Extensive chronic microvascular ischemic change throughout the white matter extending into the basal ganglia and thalamus bilaterally. Chronic ischemia in the pons bilaterally. Small chronic infarcts in the cerebellum bilaterally. Moderate atrophy.  Negative for hydrocephalus. Chronic hemorrhage in the basal ganglia bilaterally. Chronic hemorrhage in the right pons. This is likely related to chronic poorly controlled hypertension. Negative for mass or edema. Mild mucosal edema paranasal sinuses. Normal orbit bilaterally. Pituitary and skull base normal. IMPRESSION: Negative for acute infarct. Extensive chronic ischemic change. Chronic micro hemorrhage in the basal ganglia bilaterally and right pons likely related to poorly controlled hypertension. Electronically Signed   By: Franchot Gallo M.D.   On: 07/17/2015 14:22   US Carotid Bilateral  07/18/2015  CLINICAL DATA:  TIA EXAM: BILATERAL CAROTID DUPLEX ULTRASOUND TECHNIQUE: Pearline Cables scale imaging, color Doppler and duplex ultrasound were performed of bilateral carotid and vertebral arteries in the neck. COMPARISON:  None. FINDINGS: Criteria: Quantification of carotid stenosis is based on velocity parameters that correlate the residual internal carotid diameter with NASCET-based stenosis levels, using the diameter of the distal internal carotid lumen as the denominator for stenosis measurement. The following velocity measurements were obtained: RIGHT ICA:  115 cm/sec CCA:  99991111 cm/sec SYSTOLIC ICA/CCA RATIO:  1.0 DIASTOLIC ICA/CCA  RATIO:  2.1 ECA:  47 cm/sec LEFT ICA:  101 cm/sec CCA:  99991111 cm/sec SYSTOLIC ICA/CCA RATIO:  0.9 DIASTOLIC ICA/CCA RATIO:  1.3 ECA:  63 cm/sec RIGHT CAROTID ARTERY: There is moderate irregular plaque along the common carotid. There is moderate irregular plaque in the bulb. Low resistance internal carotid Doppler pattern is preserved. RIGHT VERTEBRAL ARTERY:  Antegrade with a normal Doppler pattern. LEFT CAROTID ARTERY: There is minimal smooth plaque along the common carotid. There is moderate irregular and calcified plaque in the bulb. Low resistance internal carotid Doppler pattern is preserved. LEFT VERTEBRAL ARTERY: Antegrade with a low resistance Doppler pattern. IMPRESSION: There is plaque in both internal carotid artery bulbs and common carotid arteries. Estimated stenosis is less than 50% bilaterally. Electronically Signed   By: Marybelle Killings M.D.   On: 07/18/2015 10:57   Dg Chest Port 1 View  07/18/2015  CLINICAL DATA:  Patient with history of end-stage renal disease. EXAM: PORTABLE CHEST 1 VIEW COMPARISON:  Chest  radiograph 05/30/2015. FINDINGS: Monitoring leads overlie the patient. Inferior approach central venous catheter present with tip terminating at the level of the superior vena cava. Stable cardiac and mediastinal contours. Low lung volumes. Interval development bibasilar airspace opacities. Circumferential lucency right upper hemi thorax. IMPRESSION: Circumferential lucency right upper hemi thorax likely secondary to skin fold. Small right apical pneumothorax not excluded. Recommend repeat radiograph with improved aeration. Low lung volumes with basilar airspace opacities favored represent atelectasis. These results will be called to the ordering clinician or representative by the Radiologist Assistant, and communication documented in the PACS or zVision Dashboard. Electronically Signed   By: Lovey Newcomer M.D.   On: 07/18/2015 10:03        Scheduled Meds: .  stroke: mapping our early stages  of recovery book   Does not apply Once  . sodium chloride   Intravenous Once  . atorvastatin  80 mg Oral q1800  . citalopram  10 mg Oral Daily  . epoetin (EPOGEN/PROCRIT) injection  10,000 Units Intravenous Q M,W,F-HD  . ezetimibe  10 mg Oral QHS  . feeding supplement (NEPRO CARB STEADY)  237 mL Oral BID BM  . pantoprazole  40 mg Oral Daily  . polyethylene glycol  17 g Oral BID  . white petrolatum   Topical BID   Continuous Infusions:       Time spent: 45 minutes including discussion with specialists.    Rexene Alberts, MD [facility] Pager [phone]  If 7PM-7AM, please contact night-coverage [email or web address] Password [email or web address] 07/18/2015, 11:59 AM

## 2015-07-18 NOTE — Progress Notes (Signed)
SOO RALL  11/16/1945 [id number]  Patient Care Team: Kathyrn Drown, MD as PCP - General (Family Medicine) Danie Binder, MD (Gastroenterology) Serafina Mitchell, MD (Vascular Surgery) Herminio Commons, MD as Attending Physician (Cardiology) Fran Lowes, MD as Referring Physician (Nephrology) Michael Boston, MD as Consulting Physician (General Surgery)  This patient is a 70 y.o.male whose Internist,  Dr Caryn Section, calls today for surgical evaluation.   Date of procedure/visit: 07/16/2015  POST-OPERATIVE DIAGNOSIS:   dialysis-dependent end-stage renal disease with dwindling venous access Right direct inguinal hernia  PROCEDURE:   LAPAROSCOPIC INSERTION CONTINUOUS AMBULATORY PERITONEAL DIALYSIS (CAPD) CATHETER LAPAROSCOPIC RIGHT INGUINAL HERNIA WITH MESH  SURGEON: Surgeon(s): Michael Boston, MD  ASSISTANT: RN    Reason for call: TIA/stroke.  Retroperitoneal hematoma.  Status post perineal dialysis catheter placement & RIH repair.  Called by admitting.  Patient with renal failure with dwindling HD renal access.  Underwent laparoscopic placement of perineal dialysis catheter 2 days ago.  Also had right inguinal hernia.  I repaired that in the preperitoneal fashion with mesh.  Case had minimal bleeding.  Was able to be discharged home that night.  Oral anticoagulation had been held 72 hours preop.  I recommend he start at the next morning at home.  Apparently patient began to have some neurological changes concerning for transient ischemic attack versus possible stroke.  Possibly elevated troponins.  Came to the emergency room at [facility] last night.  Admitted.  Anticoagulated.  Placed on dialysis.  Anemic.  Admitting internist wished to touch base with the operating surgeon.  Patient getting transfused.  Getting hemodialysis.  Perineal dialysis dressings are still on.  In my review patient has a moderate size retroperitoneal hematoma on the right posterior aspect  consistent with the hernia repair.  That often happens in fully anticoagulated patients that require laparoscopic preperitoneal mesh repair.  Usually not a hemodynamically significant issue and resolves over time.  Unfortunately there is no easy solution.  Probably is better to keep him somewhat anticoagulated state so that he does not have any further stroke or other issues.  May require blood transfusions to help stabilize hemodynamically.  Dr. Caryn Section is leaning towards that way anyway.  Patient seems rather constipated.  Get on a bowel regimen.  Ideally, peritoneal dialysis nurses would soon remove the dressing and begin peritoneal dialysis catheter flushes soon.  Okay to do it from my standpoint.  Patient does have some mildly diluted heparin in the peritoneal cavity which may have contributed to somewhat of the hematoma formation.  However that that lowers the chances of catheter occlusion/clot.  Okay to leave the dressings on for at least a few days until peritoneal dialysis nurses can evaluate and determine when to start flushes.  Usually that is done as an outpatient.  There is nothing surgically to be done at this time.  I would not operatively try and decompress the hematoma.  It is not a surgical issue but more of a physiological issue.  The hematoma will resorb and  hematoma resolve on its own.  Managed anemia as tolerated.  Transfuse when necessary per medicine/nephrology/cardiology recommendations.  Question answered.  Dr. Caryn Section expressed understanding and appreciation.  Patient Active Problem List   Diagnosis Date Noted  . Retroperitoneal hemorrhage 07/18/2015  . Acute blood loss anemia 07/18/2015  . Hypotension 07/18/2015  . Aspiration pneumonia ([place]) 07/18/2015  . Hyperkalemia 07/17/2015  . TIA (transient ischemic attack) 07/17/2015  . Elevated troponin I level 07/17/2015  . End stage  renal disease ([place]) 04/15/2015  . Aftercare following surgery of the circulatory system  04/15/2015  . Left arm swelling 04/15/2015  . Traumatic hematoma of left upper arm 02/11/2015  . Orthostatic hypotension 02/11/2015  . Anemia of chronic disease 02/11/2015  . End-stage renal disease on hemodialysis ([place]) 02/09/2015  . MCI (mild cognitive impairment) 10/07/2014  . Memory loss 10/07/2014  . Dementia 10/07/2014  . Stroke ([place]) 07/21/2014  . HLD (hyperlipidemia) 07/21/2014  . Paroxysmal atrial fibrillation ([place]) 07/21/2014  . Dizziness   . Malnutrition of moderate degree ([place]) 05/24/2014  . ICH (intracerebral hemorrhage) ([place]) 05/23/2014  . HTN (hypertension) 05/19/2014  . Diarrhea 11/25/2013  . Fecal impaction with overflow incontinence of loose stool unlikely infectious 11/25/2013  . Aftercare following surgery of the circulatory system, [place] 04/05/2013  . Protein-calorie malnutrition, severe ([place]) 12/06/2012  . Thrombocytopenia, unspecified ([place]) 12/06/2012  . Fall at home 12/06/2012  . UTI (urinary tract infection) 12/06/2012  . Effusion of right knee joint 12/06/2012  . Altered mental status 12/05/2012  . Vascular dementia 12/05/2012  . CAD (coronary artery disease) 10/07/2011  . Chronic systolic CHF (congestive heart failure) ([place]) 10/07/2011  . Acute respiratory disease 10/03/2011  . Non-STEMI (non-ST elevated myocardial infarction) ([place]) 10/02/2011  . Hypokalemia 10/02/2011  . Abdominal aneurysm without mention of rupture 04/04/2011  . History of noncompliance with medical treatment   . Cerebrovascular disease   . Adenomatous polyp 10/19/2010  . Hypertensive heart disease without CHF   . Hyperlipidemia   . Alcohol abuse   . Tobacco abuse   . Chronic obstructive pulmonary disease ([place]) 02/09/2010  . History of  abdominal aortic aneurysm (stent graft)     Past Medical History  Diagnosis Date  . Hypertension   . COPD (chronic obstructive pulmonary disease) ([place])   . Hyperlipidemia   . Cardiomyopathy     EF of 30% per echo in June of 2012; admitted  with congestive heart failure; nonobstructive CAD on cath in 08/2010  . History of noncompliance with medical treatment   . Cerebrovascular disease 2011    CVA  in 02/2010-only deficit is decreased vision in  right eye  . Alcohol abuse     wife states he was never heavy drinker  . Tobacco abuse     40 pack years  . Abdominal aortic aneurysm ([place])     Stent graft repair  . Chronic systolic CHF (congestive heart failure) ([place])   . Leg pain   . CAD (coronary artery disease)   . Myocardial infarction acute ([place]) 10/01/11  . Effusion of right knee joint 12/06/2012  . Anemia   . Atrial fibrillation ([place])   . Ataxic gait   . Weakness generalized   . Frequent falls   . Forgetfulness   . Stroke [facility]) 2011    decreased vision of right eye    Most recent 05/23/14 - bleed - has a drag to his left leg  . Depression   . Anxiety     Takes Citalopram  . Arthritis     knees  . Dementia     early onset  . Enlarged prostate   . GERD (gastroesophageal reflux disease)   . Constipation   . Chronic kidney disease (CKD)     dialysis MWF at [facility] in [place]    Past Surgical History  Procedure Laterality Date  . Colonoscopy w/ polypectomy  2006    Dr. Tamala Julian: anal fissure, sessile adenomatous polyp, diverticulosis  . Esophagogastroduodenoscopy  11/15/2010  Procedure: ESOPHAGOGASTRODUODENOSCOPY (EGD);  Surgeon: Dorothyann Peng, MD;  Location: [facility];  Service: Endoscopy;  Laterality: N/A;  with propofol sedation; procedure start @ 0813  . Flexible sigmoidoscopy  11/15/2010    Procedure: FLEXIBLE SIGMOIDOSCOPY;  Surgeon: Dorothyann Peng, MD;  Location: [facility];  Service: Endoscopy;  Laterality: N/A;  with propofol sedation; ended @ 0808  . Polypectomy  12/13/2010    Procedure: POLYPECTOMY;  Surgeon: Dorothyann Peng, MD;  Location: [facility];  Service: Endoscopy;  Laterality: N/A;  right colon, cecal, transverse, and sigmoid polypectomy  . Cardiac catheterization  10/04/11    Normal left main, 95% pLAD  stenosis with ulceration s/p successful BMS placement, 30% segmental plaque beyond this, dLAD after D2 with 50% plaquing, then focal 70% lesion, 80% focal short D2 stenosis, 30% pOM2 lesion, 30-40% mOM3 stenosis, Shephard's crook region of RCA with 50% lesion, mRCA 50-60% lesion and mild dRCA irregularities.   . Coronary stent placement  10/04/11  . Abdominal aortic aneurysm repair w/ endoluminal graft      01/16/2008  . Left heart catheterization with coronary angiogram N/A 10/04/2011    Procedure: LEFT HEART CATHETERIZATION WITH CORONARY ANGIOGRAM;  Surgeon: Hillary Bow, MD;  Location: [facility] CATH LAB;  Service: Cardiovascular;  Laterality: N/A;  . Av fistula placement Left 07/07/2014    Procedure: Creation of Left Arm ARTERIOVENOUS Fistula;  Surgeon: Rosetta Posner, MD;  Location: [place];  Service: Vascular;  Laterality: Left;  . Insertion of dialysis catheter Right 08/05/2014    Procedure: INSERTION OF DIALYSIS CATHETER Right internal Jugular;  Surgeon: Conrad Beatrice, MD;  Location: [place];  Service: Vascular;  Laterality: Right;  . Port-a-cath removal    . Peripheral vascular catheterization Left 01/15/2015    Procedure: Fistulagram;  Surgeon: Conrad Erhard, MD;  Location: [place] CV LAB;  Service: Cardiovascular;  Laterality: Left;  arm  . Peripheral vascular catheterization Left 01/15/2015    Procedure: Peripheral Vascular Intervention;  Surgeon: Conrad Ste[place], MD;  Location: [place] CV LAB;  Service: Cardiovascular;  Laterality: Left;  ARM VEINOUS PTA  . Insertion of dialysis catheter Left 02/13/2015    Procedure: INSERTION OF DIALYSIS CATHETER AND CENTRAL VENOGRAM;  Surgeon: Serafina Mitchell, MD;  Location: [place];  Service: Vascular;  Laterality: Left;  . Peripheral vascular catheterization N/A 04/07/2015    Procedure: A/V Shuntogram/Fistulagram;  Surgeon: Conrad Almena, MD;  Location: [place] CV LAB;  Service: Cardiovascular;  Laterality: N/A;  . Revison of arteriovenous fistula Left  [id number]    Procedure: PLICATION OF LEFT ARM PSEUDOANEURYSM;  Surgeon: Conrad Mystic, MD;  Location: [place];  Service: Vascular;  Laterality: Left;  . Patch angioplasty Left 04/09/2015    Procedure: PATCH ANGIOPLASTY;  Surgeon: Conrad Marshfield, MD;  Location: [place];  Service: Vascular;  Laterality: Left;  . Ligation of arteriovenous  fistula Left 04/15/2015    Procedure: LIGATION OF BRACHIOCEPHALIC ARTERIOVENOUS  FISTULA;  Surgeon: Mal Misty, MD;  Location: [place];  Service: Vascular;  Laterality: Left;  . Exchange of a dialysis catheter Left 05/14/2015    Procedure: EXCHANGE OF A DIALYSIS CATHETER-LEFT INTERNAL JUGULAR VEIN;  Surgeon: Conrad Bronte, MD;  Location: [place];  Service: Vascular;  Laterality: Left;  . Removal of a dialysis catheter Left 05/30/2015    Procedure: REMOVAL OF A DIALYSIS CATHETER LEFT INTERNAL JUGULAR;  Surgeon: Conrad Niobrara, MD;  Location: [place];  Service: Vascular;  Laterality: Left;  .  Insertion of dialysis catheter Right 05/30/2015    Procedure: INSERTION OF DIALYSIS CATHETER;  Surgeon: Conrad Chesapeake, MD;  Location: [place];  Service: Vascular;  Laterality: Right;  . Hematoma evacuation Left 05/30/2015    Procedure: EVACUATION NECK HEMATOMA;  Surgeon: Conrad [place], MD;  Location: [place];  Service: Vascular;  Laterality: Left;  . Capd insertion Bilateral 07/16/2015    Procedure: LAPAROSCOPIC INSERTION CONTINUOUS AMBULATORY PERITONEAL DIALYSIS  (CAPD) CATHETER;  Surgeon: Michael Boston, MD;  Location: [place];  Service: General;  Laterality: Bilateral;  . Inguinal hernia repair Right 07/16/2015    Procedure: LAPAROSCOPIC RIGHT INGUINAL HERNIA WITH MESH;  Surgeon: Michael Boston, MD;  Location: [place];  Service: General;  Laterality: Right;    Social History   Social History  . Marital Status: Married    Spouse Name: N/A  . Number of Children: N/A  . Years of Education: N/A   Occupational History  . retired     [employer]   Social History Main Topics  . Smoking status:  Former Smoker -- 0.50 packs/day for 50 years    Types: Cigarettes    Start date: 06/25/1957    Quit date: 03/04/2013  . Smokeless tobacco: Never Used  . Alcohol Use: No     Comment: None for several years  . Drug Use: No  . Sexual Activity: No   Other Topics Concern  . Not on file   Social History Narrative   Married, retired from [employer], 6 children   Right handed   Caffeine use - none    Family History  Problem Relation Age of Onset  . Colon cancer Neg Hx   . Anesthesia problems Neg Hx   . Hypotension Neg Hx   . Malignant hyperthermia Neg Hx   . Pseudochol deficiency Neg Hx   . Cancer Mother     Gall Bladder  . Heart disease Mother     before age 25  . Hyperlipidemia Mother   . Hypertension Mother   . Asthma Mother   . Cancer Sister     Cervical    Current Facility-Administered Medications  Medication Dose Route Frequency Provider Last Rate Last Dose  .  stroke: mapping our early stages of recovery book   Does not apply Once EchoStar, DO      . 0.9 %  sodium chloride infusion  100 mL Intravenous PRN Fran Lowes, MD      . 0.9 %  sodium chloride infusion  100 mL Intravenous PRN Fran Lowes, MD      . 0.9 %  sodium chloride infusion   Intravenous Once Orvan Falconer, MD      . acetaminophen (TYLENOL) suppository 650 mg  650 mg Rectal Q6H PRN Michael Boston, MD      . acetaminophen (TYLENOL) tablet 325-650 mg  325-650 mg Oral Q6H PRN Michael Boston, MD      . alteplase (CATHFLO ACTIVASE) injection 2 mg  2 mg Intracatheter Once PRN Fran Lowes, MD      . amLODipine (NORVASC) tablet 5 mg  5 mg Oral QPM Tanna Savoy Stinson, DO   5 mg at 07/17/15 2200  . atorvastatin (LIPITOR) tablet 80 mg  80 mg Oral q1800 Tanna Savoy Stinson, DO   80 mg at 07/17/15 2300  . bisacodyl (DULCOLAX) suppository 10 mg  10 mg Rectal Q12H PRN Michael Boston, MD      . citalopram (CELEXA) tablet 10 mg  10 mg Oral Daily  Tanna Savoy Stinson, DO   10 mg at 07/18/15 [id number]  . epoetin alfa  (EPOGEN,PROCRIT) injection 10,000 Units  10,000 Units Intravenous Q M,W,F-HD Fran Lowes, MD   10,000 Units at 07/17/15 1909  . ezetimibe (ZETIA) tablet 10 mg  10 mg Oral QHS Tanna Savoy Stinson, DO   10 mg at 07/17/15 2300  . feeding supplement (NEPRO CARB STEADY) liquid 237 mL  237 mL Oral BID BM Rexene Alberts, MD   237 mL at 07/18/15 1000  . heparin injection 1,000 Units  1,000 Units Dialysis PRN Fran Lowes, MD      . lip balm (CARMEX) ointment 1 application  1 application Topical BID Michael Boston, MD      . magic mouthwash  15 mL Oral QID PRN Michael Boston, MD      . menthol-cetylpyridinium (CEPACOL) lozenge 3 mg  1 lozenge Oral PRN Michael Boston, MD      . oxyCODONE-acetaminophen (PERCOCET/ROXICET) 5-325 MG per tablet 1 tablet  1 tablet Oral Q6H PRN Truett Mainland, DO   1 tablet at 07/18/15 0505  . pantoprazole (PROTONIX) EC tablet 40 mg  40 mg Oral Daily Tanna Savoy Stinson, DO   40 mg at 07/18/15 [id number]  . phenol (CHLORASEPTIC) mouth spray 2 spray  2 spray Mouth/Throat PRN Michael Boston, MD      . polyethylene glycol (MIRALAX / GLYCOLAX) packet 17 g  17 g Oral BID Michael Boston, MD         No Known Allergies  BP 100/62 mmHg  Pulse 105  Temp(Src) 99 F (37.2 C) (Oral)  Resp 16  Ht 6\' 3"  (1.905 m)  Wt 72.576 kg (160 lb)  BMI 20.00 kg/m2  SpO2 97%  Ct Abdomen Pelvis Wo Contrast  07/18/2015  CLINICAL DATA:  Anemia, concern for intra-abdominal bleed. Peritoneal catheter placed yesterday. History of abdominal aortic aneurysm EXAM: CT ABDOMEN AND PELVIS WITHOUT CONTRAST TECHNIQUE: Multidetector CT imaging of the abdomen and pelvis was performed following the standard protocol without IV contrast. COMPARISON:  01/01/2015 FINDINGS: Lower chest: There is right lower lobe consolidation and a small right pleural effusion. There is debris in the lower lobe bronchus on the right. Hepatobiliary: There is an approximately 7 mm gallstone. Liver appears normal. Pancreas: No significant pancreatic  abnormalities Spleen: The spleen is relatively diminutive but normal Adrenals/Urinary Tract: Adrenal glands are negative. Both kidneys are atrophic. Bladder is decompressed with thick wall. Stomach/Bowel: Fecal impaction in the rectum with distention of the rectum with stool. Nonobstructive gas pattern. Vascular/Lymphatic: Abdominal aortic aneurysm stable with aortic stent graft. Right iliac artery dilatation also stable. No significant adenopathy. Reproductive: Negative Other: There is a small volume of ascites in the abdomen. New there is a right femoral venous catheter extending through the heart and into the superior vena cava. There is a small volume of pneumoperitoneum in the abdomen and pelvis. This is likely related to the recent placement of left lower quadrant peritoneal catheter. There is hyper attenuating fluid in the right perinephric space extending along the right psoas muscle into the pelvis anteriorly. This continues to the pelvic floor and extends around the right and anterior wall of the bladder with mild mass effect on the bladder. The collection measures 79 x 61 mm at the level of the sacral promontory. In the posterior perinephric space the collection measures 30 x 65 mm. Musculoskeletal: There are no acute musculoskeletal findings. IMPRESSION: 1. Retroperitoneal hematoma on the right 2. Small volume pneumoperitoneum presumably related to peritoneal catheter  placement 3. Fecal impaction in the rectum 4. Aortic stent graft 5. Cholelithiasis 6. Debris in right lower lobe bronchus may represent mucous plugging or aspirated material. Right lower lobe consolidation with pleural effusion. Aspiration pneumonitis is 1 consideration. Other possibilities include pneumonia. Electronically Signed   By: Skipper Cliche M.D.   On: 07/18/2015 09:01   Ct Head Wo Contrast  07/17/2015  CLINICAL DATA:  Difficulty speaking EXAM: CT HEAD WITHOUT CONTRAST TECHNIQUE: Contiguous axial images were obtained from the  base of the skull through the vertex without intravenous contrast. COMPARISON:  05/04/2015 FINDINGS: Bony calvarium is intact. Diffuse atrophic changes are noted. Chronic white matter ischemic changes are seen as well prior lacunar infarcts are noted within the basal ganglia bilaterally. No acute hemorrhage or acute infarction is noted. IMPRESSION: Chronic atrophic and ischemic changes No acute abnormality noted. Electronically Signed   By: Inez Catalina M.D.   On: 07/17/2015 12:48   Mr Brain Wo Contrast  07/17/2015  CLINICAL DATA:  Left facial droop.  CVA. EXAM: MRI HEAD WITHOUT CONTRAST TECHNIQUE: Multiplanar, multiecho pulse sequences of the brain and surrounding structures were obtained without intravenous contrast. COMPARISON:  CT head 07/17/2015 FINDINGS: Negative for acute infarct. Extensive chronic microvascular ischemic change throughout the white matter extending into the basal ganglia and thalamus bilaterally. Chronic ischemia in the pons bilaterally. Small chronic infarcts in the cerebellum bilaterally. Moderate atrophy.  Negative for hydrocephalus. Chronic hemorrhage in the basal ganglia bilaterally. Chronic hemorrhage in the right pons. This is likely related to chronic poorly controlled hypertension. Negative for mass or edema. Mild mucosal edema paranasal sinuses. Normal orbit bilaterally. Pituitary and skull base normal. IMPRESSION: Negative for acute infarct. Extensive chronic ischemic change. Chronic micro hemorrhage in the basal ganglia bilaterally and right pons likely related to poorly controlled hypertension. Electronically Signed   By: Franchot Gallo M.D.   On: 07/17/2015 14:22   US Carotid Bilateral  07/18/2015  CLINICAL DATA:  TIA EXAM: BILATERAL CAROTID DUPLEX ULTRASOUND TECHNIQUE: Pearline Cables scale imaging, color Doppler and duplex ultrasound were performed of bilateral carotid and vertebral arteries in the neck. COMPARISON:  None. FINDINGS: Criteria: Quantification of carotid stenosis is  based on velocity parameters that correlate the residual internal carotid diameter with NASCET-based stenosis levels, using the diameter of the distal internal carotid lumen as the denominator for stenosis measurement. The following velocity measurements were obtained: RIGHT ICA:  115 cm/sec CCA:  99991111 cm/sec SYSTOLIC ICA/CCA RATIO:  1.0 DIASTOLIC ICA/CCA RATIO:  2.1 ECA:  47 cm/sec LEFT ICA:  101 cm/sec CCA:  99991111 cm/sec SYSTOLIC ICA/CCA RATIO:  0.9 DIASTOLIC ICA/CCA RATIO:  1.3 ECA:  63 cm/sec RIGHT CAROTID ARTERY: There is moderate irregular plaque along the common carotid. There is moderate irregular plaque in the bulb. Low resistance internal carotid Doppler pattern is preserved. RIGHT VERTEBRAL ARTERY:  Antegrade with a normal Doppler pattern. LEFT CAROTID ARTERY: There is minimal smooth plaque along the common carotid. There is moderate irregular and calcified plaque in the bulb. Low resistance internal carotid Doppler pattern is preserved. LEFT VERTEBRAL ARTERY: Antegrade with a low resistance Doppler pattern. IMPRESSION: There is plaque in both internal carotid artery bulbs and common carotid arteries. Estimated stenosis is less than 50% bilaterally. Electronically Signed   By: Marybelle Killings M.D.   On: 07/18/2015 10:57   Dg Chest Port 1 View  07/18/2015  CLINICAL DATA:  Patient with history of end-stage renal disease. EXAM: PORTABLE CHEST 1 VIEW COMPARISON:  Chest radiograph 05/30/2015. FINDINGS: Monitoring leads overlie  the patient. Inferior approach central venous catheter present with tip terminating at the level of the superior vena cava. Stable cardiac and mediastinal contours. Low lung volumes. Interval development bibasilar airspace opacities. Circumferential lucency right upper hemi thorax. IMPRESSION: Circumferential lucency right upper hemi thorax likely secondary to skin fold. Small right apical pneumothorax not excluded. Recommend repeat radiograph with improved aeration. Low lung volumes with  basilar airspace opacities favored represent atelectasis. These results will be called to the ordering clinician or representative by the Radiologist Assistant, and communication documented in the PACS or zVision Dashboard. Electronically Signed   By: Lovey Newcomer M.D.   On: 07/18/2015 10:03    Note: This dictation was prepared with Dragon/digital dictation along with [employer]. Any transcriptional errors that result from this process are unintentional.

## 2015-07-18 NOTE — Progress Notes (Signed)
Pharmacy Antibiotic Note  [name] is a 70 y.o. male admitted on 07/17/2015 with pneumonia.  Pharmacy has been consulted for unasyn dosing.  Plan: Unasyn 3 gm IV q24 hours F/u cultures and clinical course  Height: 6\' 3"  (190.5 cm) Weight: 160 lb (72.576 kg) IBW/kg (Calculated) : 84.5  Temp (24hrs), Avg:98.6 F (37 C), Min:98 F (36.7 C), Max:99.2 F (37.3 C)   Recent Labs Lab 07/17/15 1145 07/18/15 0324  WBC 9.0 8.4  CREATININE 8.35* 6.86*  6.79*    Estimated Creatinine Clearance: 10.1 mL/min (by C-G formula based on Cr of 6.86).    No Known Allergies  Antimicrobials this admission: 4/29 unasyn >>   Thank you for allowing pharmacy to be a part of this patient's care.  Excell Seltzer Poteet 07/18/2015 12:16 PM

## 2015-07-18 NOTE — Progress Notes (Signed)
Notified MD, pt having blood pressures in the 70/50s. Pt states he "feels fine but is just tired". MD responded and no new orders given at this time.

## 2015-07-19 DIAGNOSIS — D689 Coagulation defect, unspecified: Secondary | ICD-10-CM | POA: Diagnosis not present

## 2015-07-19 DIAGNOSIS — K5641 Fecal impaction: Secondary | ICD-10-CM

## 2015-07-19 DIAGNOSIS — K661 Hemoperitoneum: Principal | ICD-10-CM

## 2015-07-19 LAB — CBC
HCT: 24.6 % — ABNORMAL LOW (ref 39.0–52.0)
HCT: 23.4 % — ABNORMAL LOW (ref 39.0–52.0)
HEMATOCRIT: 24.5 % — AB (ref 39.0–52.0)
HEMATOCRIT: 23.6 % — AB (ref 39.0–52.0)
HEMOGLOBIN: 8.5 g/dL — AB (ref 13.0–17.0)
Hemoglobin: 8.5 g/dL — ABNORMAL LOW (ref 13.0–17.0)
Hemoglobin: 7.9 g/dL — ABNORMAL LOW (ref 13.0–17.0)
Hemoglobin: 7.8 g/dL — ABNORMAL LOW (ref 13.0–17.0)
MCH: 32.8 pg (ref 26.0–34.0)
MCH: 32.4 pg (ref 26.0–34.0)
MCH: 32.9 pg (ref 26.0–34.0)
MCH: 32.1 pg (ref 26.0–34.0)
MCHC: 34.7 g/dL (ref 30.0–36.0)
MCHC: 33.5 g/dL (ref 30.0–36.0)
MCHC: 34.6 g/dL (ref 30.0–36.0)
MCHC: 33.3 g/dL (ref 30.0–36.0)
MCV: 94.6 fL (ref 78.0–100.0)
MCV: 96.7 fL (ref 78.0–100.0)
MCV: 95.3 fL (ref 78.0–100.0)
MCV: 96.3 fL (ref 78.0–100.0)
PLATELETS: 111 10*3/uL — AB (ref 150–400)
PLATELETS: 113 10*3/uL — AB (ref 150–400)
PLATELETS: 118 10*3/uL — AB (ref 150–400)
Platelets: 117 10*3/uL — ABNORMAL LOW (ref 150–400)
RBC: 2.59 MIL/uL — AB (ref 4.22–5.81)
RBC: 2.44 MIL/uL — ABNORMAL LOW (ref 4.22–5.81)
RBC: 2.58 MIL/uL — ABNORMAL LOW (ref 4.22–5.81)
RBC: 2.43 MIL/uL — AB (ref 4.22–5.81)
RDW: 17.9 % — ABNORMAL HIGH (ref 11.5–15.5)
RDW: 17.6 % — AB (ref 11.5–15.5)
RDW: 18.2 % — AB (ref 11.5–15.5)
RDW: 18.1 % — ABNORMAL HIGH (ref 11.5–15.5)
WBC: 24.1 10*3/uL — AB (ref 4.0–10.5)
WBC: 19.8 10*3/uL — ABNORMAL HIGH (ref 4.0–10.5)
WBC: 20.6 10*3/uL — ABNORMAL HIGH (ref 4.0–10.5)
WBC: 17.9 10*3/uL — AB (ref 4.0–10.5)

## 2015-07-19 LAB — HEPATIC FUNCTION PANEL
ALT: 9 U/L — AB (ref 17–63)
AST: 39 U/L (ref 15–41)
Albumin: 3.3 g/dL — ABNORMAL LOW (ref 3.5–5.0)
Alkaline Phosphatase: 59 U/L (ref 38–126)
BILIRUBIN DIRECT: 0.2 mg/dL (ref 0.1–0.5)
BILIRUBIN INDIRECT: 0.8 mg/dL (ref 0.3–0.9)
BILIRUBIN TOTAL: 1 mg/dL (ref 0.3–1.2)
Total Protein: 6.4 g/dL — ABNORMAL LOW (ref 6.5–8.1)

## 2015-07-19 LAB — BASIC METABOLIC PANEL
ANION GAP: 12 (ref 5–15)
BUN: 29 mg/dL — AB (ref 6–20)
CO2: 24 mmol/L (ref 22–32)
Calcium: 8.4 mg/dL — ABNORMAL LOW (ref 8.9–10.3)
Chloride: 101 mmol/L (ref 101–111)
Creatinine, Ser: 5.85 mg/dL — ABNORMAL HIGH (ref 0.61–1.24)
GFR calc Af Amer: 10 mL/min — ABNORMAL LOW (ref >60)
GFR, EST NON AFRICAN AMERICAN: 9 mL/min — AB (ref >60)
GLUCOSE: 107 mg/dL — AB (ref 65–99)
POTASSIUM: 4.3 mmol/L (ref 3.5–5.1)
Sodium: 137 mmol/L (ref 135–145)

## 2015-07-19 LAB — PREPARE RBC (CROSSMATCH)

## 2015-07-19 LAB — PROTIME-INR
INR: 1.81 — ABNORMAL HIGH (ref 0.00–1.49)
Prothrombin Time: 21 seconds — ABNORMAL HIGH (ref 11.6–15.2)

## 2015-07-19 MED ORDER — SODIUM CHLORIDE 0.9 % IV SOLN: Freq: Once | INTRAVENOUS | Status: DC

## 2015-07-19 MED ORDER — VANCOMYCIN HCL 10 G IV SOLR
1500.0000 mg | Freq: Once | INTRAVENOUS | Status: AC
  Administered 2015-07-19: 1500 mg via INTRAVENOUS
  Filled 2015-07-19: qty 1500

## 2015-07-19 MED ORDER — CETYLPYRIDINIUM CHLORIDE 0.05 % MT LIQD
7.0000 mL | Freq: Two times a day (BID) | OROMUCOSAL | Status: DC
  Administered 2015-07-19 – 2015-07-25 (×10): 7 mL via OROMUCOSAL

## 2015-07-19 MED ORDER — CHLORPROMAZINE HCL 10 MG PO TABS
10.0000 mg | ORAL_TABLET | Freq: Four times a day (QID) | ORAL | Status: DC | PRN
  Administered 2015-07-19 – 2015-07-20 (×2): 10 mg via ORAL
  Filled 2015-07-19 (×3): qty 1

## 2015-07-19 MED ORDER — VITAMIN K1 10 MG/ML IJ SOLN
2.0000 mg | Freq: Every day | INTRAMUSCULAR | Status: DC
  Administered 2015-07-19: 2 mg via SUBCUTANEOUS
  Filled 2015-07-19: qty 1

## 2015-07-19 NOTE — Progress Notes (Signed)
Pharmacy Antibiotic Note  [name] is a 70 y.o. male admitted on 07/17/2015 with pneumonia.  Pharmacy has been consulted for unasyn dosing. We will also start vancomycin today as his WBCs are elevated  Plan: Vancomycin 1500 mg IV X 1 then f/u HD schedule Cont Unasyn 3 gm IV q24 hours F/u cultures and clinical course  Height: 6\' 3"  (190.5 cm) Weight: 163 lb 5.8 oz (74.1 kg) IBW/kg (Calculated) : 84.5  Temp (24hrs), Avg:98.3 F (36.8 C), Min:97.8 F (36.6 C), Max:98.9 F (37.2 C)   Recent Labs Lab 07/17/15 1145 07/18/15 0324 07/18/15 1939 07/18/15 2324 07/19/15 0437  WBC 9.0 8.4 24.5* 24.1* 19.8*  CREATININE 8.35* 6.86*  6.79*  --   --  5.85*    Estimated Creatinine Clearance: 12.1 mL/min (by C-G formula based on Cr of 5.85).    No Known Allergies  Antimicrobials this admission: 4/29 unasyn >>  4/30 Vanc>>  Thank you for allowing pharmacy to be a part of this patient's care.  Excell Seltzer Poteet 07/19/2015 8:20 AM

## 2015-07-19 NOTE — Progress Notes (Signed)
PROGRESS NOTE    [name]  [id number] DOB: 1945/12/21 DOA: 07/17/2015 PCP: [name], MD Outpatient Specialists:  Danie Binder, MD (Gastroenterology)  Serafina Mitchell, MD (Vascular Surgery)  Herminio Commons, MD (Cardiology)  Fran Lowes, MD (Nephrology)  Michael Boston, MD (General Surgery)    Brief Narrative:  Patient is a 70 year old man with history end-stage renal disease on HD, s/p laparoscopic peritoneal dialysis catheter insertion and inguinal hernia repair on the right by Dr. 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 In the ED, he was afebrile and hemodynamically stable. His potassium was elevated at 6.0, BUN 38, CO2 18, and creatinine 8.35; hemoglobin 8.7; Troponin I 0.06. CT of the head was negative for acute infarct, but with extensive chronic ischemic changes and chronic microhemorrhage in the basal ganglia bilaterally and right pons. -Day following admission, the patient became more hypotensive. Follow-up hemoglobin fell to 6.6. CT scan of the abdomen and pelvis showed a retroperitoneal hematoma. Patient was transfused 2 units of packed red blood cells. Case discussed with general surgeon, Dr. Johney Maine on 07/18/15 via phone call. (See his note)-he recommended transfusion, but no surgical intervention was needed. -Patient started on Unasyn for probable aspiration pneumonia-noted on CT scan. -Nephrology consulted for hemodialysis.   Assessment & Plan:   Principal Problem:   TIA (transient ischemic attack) Active Problems:   Hyperkalemia   Retroperitoneal hemorrhage   Acute blood loss anemia   Elevated troponin I level   Hypotension   Aspiration pneumonia (HCC)   Coagulopathy (HCC)   End-stage renal  disease on hemodialysis ([place])   Thrombocytopenia ([place])   Fecal impaction ([place])  We'll transfer the patient to stepdown unit for closer observation.  1. Left facial droop. Patient has a history of CVA and is treated chronically with aspirin, which was continued initially. Diagnostic studies revealed negative acute infarction, but with chronic microhemorrhage in the basal cannula bilaterally and the right pons per MRI of the brain; no significant ICA stenosis bilaterally per carotid ultrasound; 2-D echocardiogram revealed an EF of 55-60% and no obvious cardiac source of emboli. -Is likely that the patient had a TIA, but facial droop from hyperkalemia could have contributed. -His facial droop has resolved. Aspirin is being held due to retroperitoneal bleed.  Retroperitoneal hemorrhage. Patient is status post laparoscopic peritoneal dialysis catheter insertion on 07/16/15 by Dr. Johney Maine. On admission, the patient's hemoglobin was 8.7, but he was relatively hypotensive. CT of his abdomen and pelvis was ordered for a drop in his hemoglobin to 6.6. It revealed retroperitoneal hematoma on the right and small volume pneumoperitoneum presumably related to the peritoneal catheter placement. -Findings were discussed with surgeon, Dr. Johney Maine by phone on 4/29-see his note. He reviewed the patient's CT scan. He noted that the hematoma was not large. He recommended transfusion and to continue to monitor the patient here at [facility]. He went on to say that the peritoneal dressing can stay on and that eventually the peritoneal protocol will need to be followed regarding the catheter and dressing.  Acute blood loss anemia; history of anemia of chronic disease. Patient's hemoglobin dropped to 6.6 from 8.7. As above, patient has a retroperitoneal hemorrhage. -Aspirin and Lovenox were discontinued. -He was transfused 2 units of packed blood  cells with hemodialysis on 4/29. Hemoglobin improved to 8.6.Marland Kitchen -Hemoglobin has drifted  down to 7.9. Will favor giving another 1-2 units of packed red blood cells with HD, keep his hemoglobin at 8 or above.  Thrombocytopenia. Patient's platlet count was 143 on admission. It had fallen to a nadir of 106. Aspirin was held. Heparin during dialysis and Lovenox were discontinued following diagnoses of retroperitoneal hematoma. -It has improved. Vitamin B12 was within normal limits. Will order TSH for further evaluation. - Etiology is likely secondary to consumption thrombocytopenia from the GI bleeding and/or chronic heparin given during hemodialysis. -We'll continue to monitor closely.  Coagulopathy: INR later assessed and found to be 1.5>> 1.81. Etiology is not clear, but could be secondary to previous heparin given during dialysis and Lovenox given for DVT prophylaxis-both now discontinued. Also consider coagulopathy from the infection. -We'll give a trial of  vitamin K in light of the retroperitoneal hematoma.  Leukocytosis.  Patient's WBC was 8.4 and admission, but increased to 24.5. Etiology not clear, but the increase was following prbc transfusions. CT noted for aspiration pneumonia and this is being covered with Unasyn. -We'll add vancomycin empirically.  Hypotension. The base of blood pressures were in the [id number] systolically. Amlodipine was discontinued following admission. He was given IV fluid boluses cautiously with some improvement in his blood pressure. He was started on gentle maintenance IV fluids at 50 cc an hour. His blood pressure has improved progressively.  Elevated troponin I. Patient troponin I was 0.06 on admission. It has ranged from 0.14-0.19. The elevation is thought to be secondary to demand ischemia from hypotension and acute blood loss. He denies chest pain. -Echo noted for preserved LV EF of 55-60% and no wall motion abnormalities.  Possible aspiration pneumonia. CT scan of the patient's abdomen/pelvis revealed right lower lobe bronchus mucus plugging  and aspirated material, query aspiration pneumonitis versus pneumonia. -Given the patient's clinical picture, will order Unasyn for presumed aspiration pneumonia. -Patient's diet was downgraded to full liquids. -We'll order speech therapy consultation.  Question of pneumothorax on chest x-ray. Follow-up chest x-ray on 4/29 revealed no evidence of pneumothorax.  End-stage renal disease with hyperkalemia. Nephrology was consulted. Hemodialysis was attempted on 07/17/15, but had to be canceled due to hypotension. -The plan is to try HD again today. -His potassium level has improved.  Rectal fecal impaction Noted on the CT of the abdomen and pelvis. MiraLAX started. Soapsuds enema given with good results.  Hiccups. Will try Thorazine 1 to see if it helps.   DVT prophylaxis: Lovenox>> d/c'd /4/29>> SCDs Code Status: Full code. Family Communication: Discussion with wife pending. Disposition Plan: Discharge when clinically appropriate, likely in a few days.   Consultants:   Nephrology  Phone call conversation with general surgeon, Dr. Johney Maine.  Procedures:  1.) Hemodialysis 2.) 2-D echocardiogram 07/18/2015- Study Conclusions - Left ventricle: The cavity size was normal. Systolic function was  normal. The estimated ejection fraction was in the range of 55%  to 60%. Wall motion was normal; there were no regional wall  motion abnormalities. Doppler parameters are consistent with  abnormal left ventricular relaxation (grade 1 diastolic  dysfunction). - Ventricular septum: Septal motion showed abnormal function and  dyssynergy. IVCD. - Aortic valve: Trileaflet; mildly thickened, mildly calcified  leaflets. There was moderate regurgitation. - Pulmonary arteries: Systolic pressure was mildly increased. PA  peak pressure: 39 mm Hg (S). Impressions: - No cardiac source of emboli was indentified. Compared to the prior study, there has been no significant  interval  change.  Antimicrobials:  vancomycin: 07/19/15>> Unasyn 07/18/15>>   Subjective: Patient has no complaints of chest pain, shortness of breath, or abdominal pain. He is noted to have hiccups.  Objective: Filed Vitals:   07/19/15 0000 07/19/15 0200 07/19/15 0300 07/19/15 0400  BP: 126/73 124/78 116/78 122/84  Pulse:      Temp:      TempSrc:      Resp: 14 19 17 18   Height:      Weight:      SpO2:      Temperature 97.9. Pulse 92. Respiratory rate 18. Oxygen saturation 95% on room air.  Intake/Output Summary (Last 24 hours) at 07/19/15 0719 Last data filed at 07/18/15 1650  Gross per 24 hour  Intake    670 ml  Output   -765 ml  Net   1435 ml   Filed Weights   07/17/15 1046 07/17/15 1739 07/18/15 1345  Weight: 72.576 kg (160 lb) 72.576 kg (160 lb) 74.1 kg (163 lb 5.8 oz)    Examination:  General exam: Appears calm and comfortable; no acute distress.  Respiratory system: Clear anteriorly, with decreased breath sounds in the bases Cardiovascular system: S1 & S2 heard with a 1 to 2/6 systolic murmur. Trace pedal edema. Gastrointestinal system: Abdomen with postoperative bandage in place, blood tinge; mildly distended with scant tenderness; relatively soft; hypoactive bowel sounds. Central nervous system: Alert and oriented to himself and place, but not to year or month. No obvious facial droop or cranial nerve deficits. Extremities: No acute hot red joints. Skin: Stasis dermatitis-like changes on his lower extremities bilaterally. Psychiatry: Flat affect. Speech is clear.    Data Reviewed:   CBC:  Recent Labs Lab 07/17/15 1145 07/18/15 0324 07/18/15 1939 07/18/15 2324 07/19/15 0437  WBC 9.0 8.4 24.5* 24.1* 19.8*  HGB 8.7* 6.6* 8.6* 8.5* 7.9*  HCT 26.2* 19.4* 24.9* 24.5* 23.6*  MCV 103.1* 100.5* 94.7 94.6 96.7  PLT 106* 107* 103* 111* [id number]*   Basic Metabolic Panel:  Recent Labs Lab 07/16/15 1200 07/17/15 1145 07/18/15 0324 07/19/15 0437  NA 138 139 137   137 137  K 4.8 6.0* 5.0  5.0 4.3  CL  --  106 101  101 101  CO2  --  18* 21*  21* 24  GLUCOSE 82 133* 142*  144* 107*  BUN  --  38* 31*  32* 29*  CREATININE  --  8.35* 6.86*  6.79* 5.85*  CALCIUM  --  8.7* 8.6*  8.6* 8.4*  PHOS  --   --  5.9*  --    GFR: Estimated Creatinine Clearance: 12.1 mL/min (by C-G formula based on Cr of 5.85). Liver Function Tests:  Recent Labs Lab 07/17/15 1145 07/18/15 0324 07/19/15 0437  AST 27  --  39  ALT 11*  --  9*  ALKPHOS 67  --  59  BILITOT 0.4  --  1.0  PROT 7.0  --  6.4*  ALBUMIN 3.6 3.3* 3.3*   No results for input(s): LIPASE, AMYLASE in the last 168 hours. No results for input(s): AMMONIA in the last 168 hours. Coagulation Profile:  Recent Labs Lab 07/17/15 1145 07/18/15 1429 07/19/15 0437  INR 1.42 1.50* 1.81*   Cardiac Enzymes:  Recent Labs Lab 07/17/15 1145 07/17/15 2205 07/18/15 0324 07/18/15 0928 07/18/15 1429  TROPONINI 0.06* 0.14* 0.18* 0.19* 0.18*   BNP (last 3 results) No results for input(s): PROBNP in the last 8760 hours. HbA1C: No results for input(s): HGBA1C in  the last 72 hours. CBG:  Recent Labs Lab 07/17/15 1129  GLUCAP 83   Lipid Profile:  Recent Labs  07/18/15 0324  CHOL 97  HDL 32*  LDLCALC 52  TRIG 67  CHOLHDL 3.0   Thyroid Function Tests: No results for input(s): TSH, T4TOTAL, FREET4, T3FREE, THYROIDAB in the last 72 hours. Anemia Panel:  Recent Labs  07/18/15 0324 07/18/15 0928  VITAMINB12 559  --   FOLATE  --  19.8  FERRITIN 316  --   TIBC 182*  --   IRON 37*  --   RETICCTPCT  --  4.0*   Urine analysis:    Component Value Date/Time   COLORURINE YELLOW 05/23/2014 2037   APPEARANCEUR CLEAR 05/23/2014 2037   LABSPEC 1.020 05/23/2014 2037   PHURINE 6.5 05/23/2014 2037   GLUCOSEU NEGATIVE 05/23/2014 2037   HGBUR TRACE* 05/23/2014 2037   [place] NEGATIVE 05/23/2014 2037   [place] NEGATIVE 05/23/2014 2037   PROTEINUR >300* 05/23/2014 2037   UROBILINOGEN  0.2 05/23/2014 2037   NITRITE NEGATIVE 05/23/2014 2037   LEUKOCYTESUR NEGATIVE 05/23/2014 2037   Sepsis Labs: @LABRCNTIP (procalcitonin:4,lacticidven:4)  ) Recent Results (from the past 240 hour(s))  MRSA PCR Screening     Status: None   Collection Time: 07/18/15 12:48 PM  Result Value Ref Range Status   MRSA by PCR NEGATIVE NEGATIVE Final    Comment:        The GeneXpert MRSA Assay (FDA approved for NASAL specimens only), is one component of a comprehensive MRSA colonization surveillance program. It is not intended to diagnose MRSA infection nor to guide or monitor treatment for MRSA infections.          Radiology Studies: Ct Abdomen Pelvis Wo Contrast  07/18/2015  CLINICAL DATA:  Anemia, concern for intra-abdominal bleed. Peritoneal catheter placed yesterday. History of abdominal aortic aneurysm EXAM: CT ABDOMEN AND PELVIS WITHOUT CONTRAST TECHNIQUE: Multidetector CT imaging of the abdomen and pelvis was performed following the standard protocol without IV contrast. COMPARISON:  01/01/2015 FINDINGS: Lower chest: There is right lower lobe consolidation and a small right pleural effusion. There is debris in the lower lobe bronchus on the right. Hepatobiliary: There is an approximately 7 mm gallstone. Liver appears normal. Pancreas: No significant pancreatic abnormalities Spleen: The spleen is relatively diminutive but normal Adrenals/Urinary Tract: Adrenal glands are negative. Both kidneys are atrophic. Bladder is decompressed with thick wall. Stomach/Bowel: Fecal impaction in the rectum with distention of the rectum with stool. Nonobstructive gas pattern. Vascular/Lymphatic: Abdominal aortic aneurysm stable with aortic stent graft. Right iliac artery dilatation also stable. No significant adenopathy. Reproductive: Negative Other: There is a small volume of ascites in the abdomen. New there is a right femoral venous catheter extending through the heart and into the superior vena cava.  There is a small volume of pneumoperitoneum in the abdomen and pelvis. This is likely related to the recent placement of left lower quadrant peritoneal catheter. There is hyper attenuating fluid in the right perinephric space extending along the right psoas muscle into the pelvis anteriorly. This continues to the pelvic floor and extends around the right and anterior wall of the bladder with mild mass effect on the bladder. The collection measures 79 x 61 mm at the level of the sacral promontory. In the posterior perinephric space the collection measures 30 x 65 mm. Musculoskeletal: There are no acute musculoskeletal findings. IMPRESSION: 1. Retroperitoneal hematoma on the right 2. Small volume pneumoperitoneum presumably related to peritoneal catheter placement 3. Fecal impaction in  the rectum 4. Aortic stent graft 5. Cholelithiasis 6. Debris in right lower lobe bronchus may represent mucous plugging or aspirated material. Right lower lobe consolidation with pleural effusion. Aspiration pneumonitis is 1 consideration. Other possibilities include pneumonia. Electronically Signed   By: Skipper Cliche M.D.   On: 07/18/2015 09:01   Ct Head Wo Contrast  07/17/2015  CLINICAL DATA:  Difficulty speaking EXAM: CT HEAD WITHOUT CONTRAST TECHNIQUE: Contiguous axial images were obtained from the base of the skull through the vertex without intravenous contrast. COMPARISON:  05/04/2015 FINDINGS: Bony calvarium is intact. Diffuse atrophic changes are noted. Chronic white matter ischemic changes are seen as well prior lacunar infarcts are noted within the basal ganglia bilaterally. No acute hemorrhage or acute infarction is noted. IMPRESSION: Chronic atrophic and ischemic changes No acute abnormality noted. Electronically Signed   By: Inez Catalina M.D.   On: 07/17/2015 12:48   Mr Brain Wo Contrast  07/17/2015  CLINICAL DATA:  Left facial droop.  CVA. EXAM: MRI HEAD WITHOUT CONTRAST TECHNIQUE: Multiplanar, multiecho pulse  sequences of the brain and surrounding structures were obtained without intravenous contrast. COMPARISON:  CT head 07/17/2015 FINDINGS: Negative for acute infarct. Extensive chronic microvascular ischemic change throughout the white matter extending into the basal ganglia and thalamus bilaterally. Chronic ischemia in the pons bilaterally. Small chronic infarcts in the cerebellum bilaterally. Moderate atrophy.  Negative for hydrocephalus. Chronic hemorrhage in the basal ganglia bilaterally. Chronic hemorrhage in the right pons. This is likely related to chronic poorly controlled hypertension. Negative for mass or edema. Mild mucosal edema paranasal sinuses. Normal orbit bilaterally. Pituitary and skull base normal. IMPRESSION: Negative for acute infarct. Extensive chronic ischemic change. Chronic micro hemorrhage in the basal ganglia bilaterally and right pons likely related to poorly controlled hypertension. Electronically Signed   By: Franchot Gallo M.D.   On: 07/17/2015 14:22   US Carotid Bilateral  07/18/2015  CLINICAL DATA:  TIA EXAM: BILATERAL CAROTID DUPLEX ULTRASOUND TECHNIQUE: Pearline Cables scale imaging, color Doppler and duplex ultrasound were performed of bilateral carotid and vertebral arteries in the neck. COMPARISON:  None. FINDINGS: Criteria: Quantification of carotid stenosis is based on velocity parameters that correlate the residual internal carotid diameter with NASCET-based stenosis levels, using the diameter of the distal internal carotid lumen as the denominator for stenosis measurement. The following velocity measurements were obtained: RIGHT ICA:  115 cm/sec CCA:  99991111 cm/sec SYSTOLIC ICA/CCA RATIO:  1.0 DIASTOLIC ICA/CCA RATIO:  2.1 ECA:  47 cm/sec LEFT ICA:  101 cm/sec CCA:  99991111 cm/sec SYSTOLIC ICA/CCA RATIO:  0.9 DIASTOLIC ICA/CCA RATIO:  1.3 ECA:  63 cm/sec RIGHT CAROTID ARTERY: There is moderate irregular plaque along the common carotid. There is moderate irregular plaque in the bulb. Low  resistance internal carotid Doppler pattern is preserved. RIGHT VERTEBRAL ARTERY:  Antegrade with a normal Doppler pattern. LEFT CAROTID ARTERY: There is minimal smooth plaque along the common carotid. There is moderate irregular and calcified plaque in the bulb. Low resistance internal carotid Doppler pattern is preserved. LEFT VERTEBRAL ARTERY: Antegrade with a low resistance Doppler pattern. IMPRESSION: There is plaque in both internal carotid artery bulbs and common carotid arteries. Estimated stenosis is less than 50% bilaterally. Electronically Signed   By: Marybelle Killings M.D.   On: 07/18/2015 10:57   Dg Chest Port 1 View  07/18/2015  CLINICAL DATA:  Patient with history of end-stage renal disease. EXAM: PORTABLE CHEST 1 VIEW COMPARISON:  Chest radiograph 05/30/2015. FINDINGS: Monitoring leads overlie the patient. Inferior approach central  venous catheter present with tip terminating at the level of the superior vena cava. Stable cardiac and mediastinal contours. Low lung volumes. Interval development bibasilar airspace opacities. Circumferential lucency right upper hemi thorax. IMPRESSION: Circumferential lucency right upper hemi thorax likely secondary to skin fold. Small right apical pneumothorax not excluded. Recommend repeat radiograph with improved aeration. Low lung volumes with basilar airspace opacities favored represent atelectasis. These results will be called to the ordering clinician or representative by the Radiologist Assistant, and communication documented in the PACS or zVision Dashboard. Electronically Signed   By: Lovey Newcomer M.D.   On: 07/18/2015 10:03   Dg Chest Port 1v Same Day  07/18/2015  CLINICAL DATA:  70 year old male with history of abnormal chest x-ray. Followup study. EXAM: PORTABLE CHEST 1 VIEW COMPARISON:  Chest x-ray 07/18/2015. FINDINGS: Lung volumes are low. Bibasilar areas of subsegmental atelectasis. No consolidative airspace disease. No pleural effusions. No  pneumothorax (previously suspected skin fold in the apex of the right hemithorax has resolved). No pulmonary nodule or mass noted. Pulmonary vasculature and the cardiomediastinal silhouette are within normal limits. PermCath from IVC approach with tip terminating in the superior vena cava. IMPRESSION: 1. No pneumothorax. 2. Low lung volumes with bibasilar subsegmental atelectasis. Electronically Signed   By: Vinnie Langton M.D.   On: 07/18/2015 19:31        Scheduled Meds: .  stroke: mapping our early stages of recovery book   Does not apply Once  . sodium chloride   Intravenous Once  . ampicillin-sulbactam (UNASYN) IV  3 g Intravenous Q24H  . atorvastatin  80 mg Oral q1800  . citalopram  10 mg Oral Daily  . epoetin (EPOGEN/PROCRIT) injection  10,000 Units Intravenous Q M,W,F-HD  . ezetimibe  10 mg Oral QHS  . feeding supplement (NEPRO CARB STEADY)  237 mL Oral BID BM  . pantoprazole  40 mg Oral Daily  . polyethylene glycol  17 g Oral BID  . white petrolatum   Topical BID   Continuous Infusions: . dextrose 5 % and 0.9% NaCl 50 mL/hr at 07/18/15 1936     LOS: 1 day    Time spent: 45 minutes including discussion with specialists.    Rexene Alberts, MD [facility] Pager [phone]  If 7PM-7AM, please contact night-coverage [email or web address] Password [facility] 07/19/2015, 7:19 AM

## 2015-07-19 NOTE — Progress Notes (Signed)
Hospitalist stated to give two units of packed red blood cells with next dialysis treatment. Nephrology ordered dialysis for tomorrow 5/1. Hospitalist made aware. No new orders received.

## 2015-07-19 NOTE — Progress Notes (Signed)
Subjective: Left facial droop, eating soft foods, but with some difficulty swallowing; no dyspnea on supplemental O2, no pain, but very weak  Objective: Vital signs in last 24 hours: Temp:  [97.8 F (36.6 C)-98.9 F (37.2 C)] 97.9 F (36.6 C) (04/29 2000) Pulse Rate:  [82-142] 92 (04/30 0833) Resp:  [14-26] 18 (04/30 0833) BP: (81-138)/(54-84) 131/71 mmHg (04/30 0833) SpO2:  [94 %-96 %] 95 % (04/30 0833) Weight:  [74.1 kg (163 lb 5.8 oz)] 74.1 kg (163 lb 5.8 oz) (04/29 1345) Weight change: 1.524 kg (3 lb 5.8 oz)  Intake/Output from previous day: 04/29 0701 - 04/30 0700 In: 670 [Blood:670] Out: -765  Intake/Output this shift:   EXAM: General appearance:  Alert, left facial droop, in no apparent distress Resp: CTA without rales, rhonchi, or wheezes Cardio:  RRR without murmur or rub GI: + BS, soft and nontneder, PD catheter in situ Extremities:  No edema Access:  R femoral catheter  Lab Results:  Recent Labs  07/18/15 2324 07/19/15 0437  WBC 24.1* 19.8*  HGB 8.5* 7.9*  HCT 24.5* 23.6*  PLT 111* 113*   BMET:  Recent Labs  07/18/15 0324 07/19/15 0437  NA 137  137 137  K 5.0  5.0 4.3  CL 101  101 101  CO2 21*  21* 24  GLUCOSE 142*  144* 107*  BUN 31*  32* 29*  CREATININE 6.86*  6.79* 5.85*  CALCIUM 8.6*  8.6* 8.4*  ALBUMIN 3.3* 3.3*   No results for input(s): PTH in the last 72 hours. Iron Studies:  Recent Labs  07/18/15 0324  IRON 37*  TIBC 182*  FERRITIN 316    Studies/Results: US Carotid Bilateral  07/18/2015  CLINICAL DATA:  TIA EXAM: BILATERAL CAROTID DUPLEX ULTRASOUND TECHNIQUE: Pearline Cables scale imaging, color Doppler and duplex ultrasound were performed of bilateral carotid and vertebral arteries in the neck. COMPARISON:  None. FINDINGS: Criteria: Quantification of carotid stenosis is based on velocity parameters that correlate the residual internal carotid diameter with NASCET-based stenosis levels, using the diameter of the distal internal  carotid lumen as the denominator for stenosis measurement. The following velocity measurements were obtained: RIGHT ICA:  115 cm/sec CCA:  99991111 cm/sec SYSTOLIC ICA/CCA RATIO:  1.0 DIASTOLIC ICA/CCA RATIO:  2.1 ECA:  47 cm/sec LEFT ICA:  101 cm/sec CCA:  99991111 cm/sec SYSTOLIC ICA/CCA RATIO:  0.9 DIASTOLIC ICA/CCA RATIO:  1.3 ECA:  63 cm/sec RIGHT CAROTID ARTERY: There is moderate irregular plaque along the common carotid. There is moderate irregular plaque in the bulb. Low resistance internal carotid Doppler pattern is preserved. RIGHT VERTEBRAL ARTERY:  Antegrade with a normal Doppler pattern. LEFT CAROTID ARTERY: There is minimal smooth plaque along the common carotid. There is moderate irregular and calcified plaque in the bulb. Low resistance internal carotid Doppler pattern is preserved. LEFT VERTEBRAL ARTERY: Antegrade with a low resistance Doppler pattern. IMPRESSION: There is plaque in both internal carotid artery bulbs and common carotid arteries. Estimated stenosis is less than 50% bilaterally. Electronically Signed   By: Marybelle Killings M.D.   On: 07/18/2015 10:57   Dg Chest Port 1 View  07/18/2015  CLINICAL DATA:  Patient with history of end-stage renal disease. EXAM: PORTABLE CHEST 1 VIEW COMPARISON:  Chest radiograph 05/30/2015. FINDINGS: Monitoring leads overlie the patient. Inferior approach central venous catheter present with tip terminating at the level of the superior vena cava. Stable cardiac and mediastinal contours. Low lung volumes. Interval development bibasilar airspace opacities. Circumferential lucency right upper hemi thorax. IMPRESSION:  Circumferential lucency right upper hemi thorax likely secondary to skin fold. Small right apical pneumothorax not excluded. Recommend repeat radiograph with improved aeration. Low lung volumes with basilar airspace opacities favored represent atelectasis. These results will be called to the ordering clinician or representative by the Radiologist Assistant,  and communication documented in the PACS or zVision Dashboard. Electronically Signed   By: Lovey Newcomer M.D.   On: 07/18/2015 10:03   Dg Chest Port 1v Same Day  07/18/2015  CLINICAL DATA:  70 year old male with history of abnormal chest x-ray. Followup study. EXAM: PORTABLE CHEST 1 VIEW COMPARISON:  Chest x-ray 07/18/2015. FINDINGS: Lung volumes are low. Bibasilar areas of subsegmental atelectasis. No consolidative airspace disease. No pleural effusions. No pneumothorax (previously suspected skin fold in the apex of the right hemithorax has resolved). No pulmonary nodule or mass noted. Pulmonary vasculature and the cardiomediastinal silhouette are within normal limits. PermCath from IVC approach with tip terminating in the superior vena cava. IMPRESSION: 1. No pneumothorax. 2. Low lung volumes with bibasilar subsegmental atelectasis. Electronically Signed   By: Vinnie Langton M.D.   On: 07/18/2015 19:31     Assessment/Plan: 1. TIA - History of CVA, but MRI 4/28 was negative for acute infarct; mental status at baseline. 2. Hyperkalemia - Most likely from high potassium intake, now 4.3 s/p dialysis. 3. End-stage renal disease - Stable s/p dialysis on 4/28 & 29; no uremic signs or symptoms. 4. Hypertension: His blood pressure is currently controlled. 5. PD catheter placement - s/p laparoscopic placement with right inguinal hernia repair per Dr. Johney Maine 4/27. 6. Anemia - s/p 2 U PRBCs yesterday for Hgb 6.6, improved to 8.6, but today 7.9; CT showed right retroperitoneal hematoma, consistent with hernia repair. 7. Fluid management - Patient has no signs of fluid overload, no edema per x-ray. 8. Hypertension - His BP is currently controlled. 9. Metabolic bone disease - His calcium is in range.   Plan:  Dialysis tomorrow on regular schedule           Renal panel and CBC in AM           Reviewed with Dr. Lowanda Foster   LOS: 1 day   LYLES,CHARLES 07/19/2015,8:48 AM

## 2015-07-20 DIAGNOSIS — I10 Essential (primary) hypertension: Secondary | ICD-10-CM

## 2015-07-20 LAB — RENAL FUNCTION PANEL
ALBUMIN: 3.2 g/dL — AB (ref 3.5–5.0)
Anion gap: 12 (ref 5–15)
BUN: 41 mg/dL — AB (ref 6–20)
CO2: 23 mmol/L (ref 22–32)
CREATININE: 8.06 mg/dL — AB (ref 0.61–1.24)
Calcium: 8.6 mg/dL — ABNORMAL LOW (ref 8.9–10.3)
Chloride: 104 mmol/L (ref 101–111)
GFR calc Af Amer: 7 mL/min — ABNORMAL LOW (ref >60)
GFR, EST NON AFRICAN AMERICAN: 6 mL/min — AB (ref >60)
Glucose, Bld: 115 mg/dL — ABNORMAL HIGH (ref 65–99)
PHOSPHORUS: 3.9 mg/dL (ref 2.5–4.6)
Potassium: 4 mmol/L (ref 3.5–5.1)
SODIUM: 139 mmol/L (ref 135–145)

## 2015-07-20 LAB — HEMOGLOBIN A1C
Hgb A1c MFr Bld: 4.9 % (ref 4.8–5.6)
MEAN PLASMA GLUCOSE: 94 mg/dL

## 2015-07-20 LAB — CBC
HCT: 22.1 % — ABNORMAL LOW (ref 39.0–52.0)
HCT: 22.5 % — ABNORMAL LOW (ref 39.0–52.0)
HEMOGLOBIN: 7.6 g/dL — AB (ref 13.0–17.0)
Hemoglobin: 7.7 g/dL — ABNORMAL LOW (ref 13.0–17.0)
MCH: 32.6 pg (ref 26.0–34.0)
MCH: 33 pg (ref 26.0–34.0)
MCHC: 34.2 g/dL (ref 30.0–36.0)
MCHC: 34.4 g/dL (ref 30.0–36.0)
MCV: 95.3 fL (ref 78.0–100.0)
MCV: 96.1 fL (ref 78.0–100.0)
PLATELETS: 125 10*3/uL — AB (ref 150–400)
PLATELETS: 130 10*3/uL — AB (ref 150–400)
RBC: 2.3 MIL/uL — ABNORMAL LOW (ref 4.22–5.81)
RBC: 2.36 MIL/uL — ABNORMAL LOW (ref 4.22–5.81)
RDW: 17.8 % — ABNORMAL HIGH (ref 11.5–15.5)
RDW: 17.7 % — AB (ref 11.5–15.5)
WBC: 15.2 10*3/uL — ABNORMAL HIGH (ref 4.0–10.5)
WBC: 16.2 10*3/uL — AB (ref 4.0–10.5)

## 2015-07-20 LAB — PROTIME-INR
INR: 1.45 (ref 0.00–1.49)
PROTHROMBIN TIME: 17.7 s — AB (ref 11.6–15.2)

## 2015-07-20 LAB — TSH: TSH: 2.96 u[IU]/mL (ref 0.350–4.500)

## 2015-07-20 MED ORDER — SODIUM CHLORIDE 0.9 % IV SOLN: 100.0000 mL | INTRAVENOUS | Status: DC | PRN

## 2015-07-20 MED ORDER — HEPARIN SODIUM (PORCINE) 1000 UNIT/ML IJ SOLN
INTRAMUSCULAR | Status: AC
  Administered 2015-07-20: 6000 [IU] via INTRAVENOUS_CENTRAL
  Filled 2015-07-20: qty 8

## 2015-07-20 MED ORDER — ALTEPLASE 2 MG IJ SOLR
2.0000 mg | Freq: Once | INTRAMUSCULAR | Status: DC | PRN
  Filled 2015-07-20: qty 2

## 2015-07-20 MED ORDER — HEPARIN SODIUM (PORCINE) 1000 UNIT/ML DIALYSIS
1000.0000 [IU] | INTRAMUSCULAR | Status: DC | PRN
  Administered 2015-07-20: 6000 [IU] via INTRAVENOUS_CENTRAL
  Filled 2015-07-20 (×2): qty 1

## 2015-07-20 MED ORDER — VANCOMYCIN HCL IN DEXTROSE 750-5 MG/150ML-% IV SOLN
750.0000 mg | INTRAVENOUS | Status: DC
  Administered 2015-07-20 – 2015-07-22 (×2): 750 mg via INTRAVENOUS
  Filled 2015-07-20 (×2): qty 150

## 2015-07-20 MED ORDER — CLONIDINE HCL 0.2 MG PO TABS
0.2000 mg | ORAL_TABLET | Freq: Two times a day (BID) | ORAL | Status: DC
  Administered 2015-07-21 – 2015-07-24 (×6): 0.2 mg via ORAL
  Filled 2015-07-20 (×10): qty 1

## 2015-07-20 MED ORDER — EPOETIN ALFA 10000 UNIT/ML IJ SOLN
INTRAMUSCULAR | Status: AC
Start: 2015-07-20 — End: 2015-07-20
  Administered 2015-07-20: 10000 [IU] via INTRAVENOUS
  Filled 2015-07-20: qty 1

## 2015-07-20 NOTE — Progress Notes (Signed)
Subjective: Interval History: has no complaint of nausea or vomiting. His appetite however is poor according to his wife. Patient denies any difficulty breathing..  Objective: Vital signs in last 24 hours: Temp:  [100.5 F (38.1 C)] 100.5 F (38.1 C) (04/30 2000) Pulse Rate:  [68-103] 68 (05/01 0800) Resp:  [15-23] 18 (05/01 0800) BP: (117-210)/(69-107) 210/107 mmHg (05/01 0800) SpO2:  [90 %-100 %] 93 % (05/01 0800) Weight change:   Intake/Output from previous day: 04/30 0701 - 05/01 0700 In: 2469.2 [P.O.:240; I.V.:1729.2; IV Piggyback:500] Out: -  Intake/Output this shift: Total I/O In: 50 [I.V.:50] Out: -   General appearance: alert, cooperative and no distress Resp: clear to auscultation bilaterally Cardio: regular rate and rhythm, S1, S2 normal, no murmur, click, rub or gallop GI: Patient is still with dressing. His positive bowel sound. Extremities: extremities normal, atraumatic, no cyanosis or edema  Lab Results:  Recent Labs  07/20/15 0005 07/20/15 0423  WBC 16.2* 15.2*  HGB 7.7* 7.6*  HCT 22.5* 22.1*  PLT 125* 130*   BMET:  Recent Labs  07/19/15 0437 07/20/15 0423  NA 137 139  K 4.3 4.0  CL 101 104  CO2 24 23  GLUCOSE 107* 115*  BUN 29* 41*  CREATININE 5.85* 8.06*  CALCIUM 8.4* 8.6*   No results for input(s): PTH in the last 72 hours. Iron Studies:  Recent Labs  07/18/15 0324  IRON 37*  TIBC 182*  FERRITIN 316    Studies/Results: Dg Chest Port 1v Same Day  07/18/2015  CLINICAL DATA:  70 year old male with history of abnormal chest x-ray. Followup study. EXAM: PORTABLE CHEST 1 VIEW COMPARISON:  Chest x-ray 07/18/2015. FINDINGS: Lung volumes are low. Bibasilar areas of subsegmental atelectasis. No consolidative airspace disease. No pleural effusions. No pneumothorax (previously suspected skin fold in the apex of the right hemithorax has resolved). No pulmonary nodule or mass noted. Pulmonary vasculature and the cardiomediastinal silhouette are  within normal limits. PermCath from IVC approach with tip terminating in the superior vena cava. IMPRESSION: 1. No pneumothorax. 2. Low lung volumes with bibasilar subsegmental atelectasis. Electronically Signed   By: Vinnie Langton M.D.   On: 07/18/2015 19:31    I have reviewed the patient's current medications.  Assessment/Plan: Problem #1 altered mental status: Presently seems to be better. This could be secondary to TIA. Problem #2 history of fever with leukocytosis. Blood culture no growth. Presently he is on antibiotics. His white blood cell count is high but improving. Source of infection could be from aspiration pneumonia. Problem #3 anemia: Secondary to bleeding. Patient is retroperitoneal blood. He has received blood transfusion his hemoglobin is low but stable. Problem #4: Hypertension: His blood pressure seems to be high today. It was low when he came. Problem #5 . Retroperitoeal hematoma. Patient had Peritoneal  dialysis catheter placed and also right hernia repair. Problem #6 metabolic bone disease: His calcium and phosphorus is 0 range. Problem #7 history of COPD Problem #8 history of abdominal aortic aneurysm. Plan: We'll make arrangements for patient to get dialysis today 2] we'll continue his antibiotics 3] we'll give him Epogen 10,000 units after each dialysis. 4] we'll give him clonidine 0.2 mg by mouth twice a day for systolic blood pressure above 160. However his blood pressure continues to be high probability of getting back on some antihypertensive medication.   LOS: 2 days   Emberli Ballester S 07/20/2015,10:36 AM

## 2015-07-20 NOTE — Progress Notes (Signed)
PT Cancellation Note  Patient Details Name: [name] MRN: [id number] DOB: May 26, 1945   Cancelled Treatment:    Reason Eval/Treat Not Completed: Patient not medically ready PT evaluation orders received.  Pt admitted with suspected CVA, but symptoms resolved.  Head CT, and MRI both (-) for acute changes.  Pt now with down trending troponin levels, however H&H remain low at 7.6/22.1 with plans for pt to receive PRBC's during dialysis today.  Will check back to perform initial evaluation tomorrow.     Beth Arta Stump, PT, DPT X: [phone]  07/20/2015, 8:40 AM

## 2015-07-20 NOTE — Progress Notes (Signed)
PROGRESS NOTE    [name]  [id number] DOB: 1945-10-03 DOA: 07/17/2015 PCP: [name], MD Outpatient Specialists:  Danie Binder, MD (Gastroenterology)  Serafina Mitchell, MD (Vascular Surgery)  Herminio Commons, MD (Cardiology)  Fran Lowes, MD (Nephrology)  Michael Boston, MD (General Surgery)    Brief Narrative:  Patient is a 70 year old man with history end-stage renal disease on HD, s/p laparoscopic peritoneal dialysis catheter insertion and inguinal hernia repair on the right by Dr. 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 In the ED, he was afebrile and hemodynamically stable. His potassium was elevated at 6.0, BUN 38, CO2 18, and creatinine 8.35; hemoglobin 8.7; Troponin I 0.06. CT of the head was negative for acute infarct, but with extensive chronic ischemic changes and chronic microhemorrhage in the basal ganglia bilaterally and right pons. -Day following admission, the patient became more hypotensive. Follow-up hemoglobin fell to 6.6. CT scan of the abdomen and pelvis showed a retroperitoneal hematoma. Patient was transfused 2 units of packed red blood cells. Case discussed with general surgeon, Dr. Johney Maine on 07/18/15 via phone call. (See his note)-he recommended transfusion, but no surgical intervention was needed. -Patient started on Unasyn for probable aspiration pneumonia-noted on CT scan. -Nephrology consulted for hemodialysis.   Assessment & Plan:   Principal Problem:   TIA (transient ischemic attack) Active Problems:   Hyperkalemia   Retroperitoneal hemorrhage   Acute blood loss anemia   Elevated troponin I level   Hypotension   Aspiration pneumonia (HCC)   Coagulopathy (HCC)   End-stage renal  disease on hemodialysis (HCC)   Thrombocytopenia (HCC)   Fecal impaction (HCC)   Essential hypertension   1. Left facial droop. Patient has a history of CVA and is treated chronically with aspirin, which was continued initially. Diagnostic studies revealed negative acute infarction, but with chronic microhemorrhage in the basal cannula bilaterally and the right pons per MRI of the brain; no significant ICA stenosis bilaterally per carotid ultrasound; 2-D echocardiogram revealed an EF of 55-60% and no obvious cardiac source of emboli. -Is likely that the patient had a TIA, but facial droop from hyperkalemia could have contributed. -His facial droop has resolved. Aspirin is being held due to the retroperitoneal bleed.  Retroperitoneal hemorrhage. Patient is status post laparoscopic peritoneal dialysis catheter insertion on 07/16/15 by Dr. Johney Maine. On admission, the patient's hemoglobin was 8.7, but he was relatively hypotensive. CT of his abdomen and pelvis was ordered for a drop in his hemoglobin to 6.6. It revealed retroperitoneal hematoma on the right and small volume pneumoperitoneum presumably related to the peritoneal catheter placement. -Findings were discussed with surgeon, Dr. Johney Maine by phone on 4/29-see his note. He reviewed the patient's CT scan. He noted that the hematoma was not large. He recommended transfusion and to continue to monitor the patient here at [facility]. He recommend peritoneal catheter flush and dressing changes per the dialysis protocol. -Patient has minimal abdominal tenderness; abdomen less distended.  Acute blood loss anemia; history of anemia of chronic disease. Patient's hemoglobin dropped to 6.6 from 8.7. As above, patient has a retroperitoneal hemorrhage. -Aspirin and Lovenox were discontinued. -Anemia panel revealed a total iron of 37, ferritin 316, and B12 559. -He was transfused 2 units of packed blood  cells with hemodialysis on 4/29. Hemoglobin improved to  8.6.Marland Kitchen -Hemoglobin has drifted down to 7.6. Another 2 units of packed red blood cells will be given on 5/1 with HD.  Thrombocytopenia. Patient's platlet count was 143 on admission. It had fallen to a nadir of 106. Aspirin was held. Heparin during dialysis and Lovenox were discontinued following dx of of retroperitoneal hematoma. -Platelet count has improved. Vitamin B12 was within normal limits. Will order TSH for further evaluation. - Etiology is likely secondary to consumption thrombocytopenia from the GI bleeding and/or chronic heparin given during hemodialysis. -We'll continue to monitor closely.  Coagulopathy: INR later assessed and found to be 1.5>> 1.81. Etiology is not clear, but could be secondary to previous heparin given during dialysis and Lovenox given for DVT prophylaxis-both now discontinued. Also consider coagulopathy from the infection. -Vitamin K subcutaneous given 1.. His INR has improved to 1.45.  Leukocytosis.  Patient's WBC was 8.4 and admission, but increased to 24.5. He developed a low-grade fever.CT noted for aspiration pneumonia. He was started on Unasyn. Vancomycin was added empirically. -Blood cultures ordered on 5/1.  His white blood cell count is trending down.  Hypotension>> Essential hypertension The base of blood pressures were in the [id number] systolically. Amlodipine was discontinued following admission. He was given IV fluid boluses cautiously with some improvement in his blood pressure. He was started on gentle maintenance IV fluids at 50 cc an hour. His blood pressure has improved progressively. -We'll restart amlodipine.  Elevated troponin I. Patient troponin I was 0.06 on admission. It has ranged from 0.14-0.19. The elevation is thought to be secondary to demand ischemia from hypotension and acute blood loss. He denies chest pain. -Echo noted for preserved LV EF of 55-60% and no wall motion abnormalities.  Aspiration pneumonia. CT scan of the patient's  abdomen/pelvis revealed right lower lobe bronchus mucus plugging and aspirated material, query aspiration pneumonitis versus pneumonia. -Given the patient's TIA in history of previous stroke, dysphagia is likely. Unasyn was ordered for  aspiration pneumonia. -His diet was downgraded to dysphagia 2 with nectar thickened liquids. Speech therapy consulted.  Question of pneumothorax on chest x-ray. Follow-up chest x-ray on 4/29 revealed no evidence of pneumothorax.  End-stage renal disease with hyperkalemia. Nephrology was consulted. Hemodialysis was attempted on 07/17/15, but had to be canceled due to hypotension. -Patient's blood pressure improved and he has undergone hemodialysis. -His potassium level has improved.  Rectal fecal impaction Noted on the CT of the abdomen and pelvis. MiraLAX started. Soapsuds enema given with good results.  Hiccups. Low dose when necessary Thorazine started.   DVT prophylaxis: Lovenox>> d/c'd /4/29>> SCDs Code Status: Full code. Family Communication: Discussion with wife on 07/19/15 Disposition Plan: Discharge when clinically appropriate, likely in a few days.   Consultants:   Nephrology  Phone call conversation with general surgeon, Dr. Johney Maine.  Procedures:  1.) Hemodialysis 2.) 2-D echocardiogram 07/18/2015- Study Conclusions - Left ventricle: The cavity size was normal. Systolic function was  normal. The estimated ejection fraction was in the range of 55%  to 60%. Wall motion was normal; there were no regional wall  motion abnormalities. Doppler parameters are consistent with  abnormal left ventricular relaxation (grade 1 diastolic  dysfunction). - Ventricular septum: Septal motion showed abnormal function and  dyssynergy. IVCD. - Aortic valve: Trileaflet; mildly thickened, mildly calcified  leaflets. There was moderate regurgitation. - Pulmonary arteries: Systolic pressure was mildly increased. PA  peak pressure: 39 mm Hg  (S). Impressions: - No cardiac source of  emboli was indentified. Compared to the prior study, there has been no significant interval change.  Antimicrobials:  vancomycin: 07/19/15>> Unasyn 07/18/15>>   Subjective: Patient has no complaints of abdominal pain. His hiccups have subsided.  Objective: Filed Vitals:   07/19/15 1800 07/19/15 1900 07/19/15 2000 07/20/15 0800  BP: 164/69  117/73 210/107  Pulse: 101 102 103 68  Temp:   100.5 F (38.1 C)   TempSrc:   Oral   Resp: 16 21 23 18   Height:      Weight:      SpO2: 100% 91% 100% 93%    Intake/Output Summary (Last 24 hours) at 07/20/15 0842 Last data filed at 07/20/15 0800  Gross per 24 hour  Intake 2519.17 ml  Output      0 ml  Net 2519.17 ml   Filed Weights   07/17/15 1046 07/17/15 1739 07/18/15 1345  Weight: 72.576 kg (160 lb) 72.576 kg (160 lb) 74.1 kg (163 lb 5.8 oz)    Examination:  General exam: Appears calm and comfortable; no acute distress.  Respiratory system: Clear anteriorly, with decreased breath sounds in the bases Cardiovascular system: S1 & S2 heard with  2/6 systolic murmur. Trace pedal edema. Gastrointestinal system: Abdomen with postoperative bandage in place, Dried blood tinged; less distended with scant tenderness; relatively soft; hypoactive bowel sounds. Central nervous system: Alert and oriented to himself and place. Speech is mostly clear. No obvious facial droop or cranial nerve deficits. Extremities: No acute hot red joints. Skin: Stasis dermatitis-like changes on his lower extremities bilaterally. Psychiatry: Flat affect. Speech is clear.    Data Reviewed:   CBC:  Recent Labs Lab 07/19/15 0437 07/19/15 1208 07/19/15 1839 07/20/15 0005 07/20/15 0423  WBC 19.8* 20.6* 17.9* 16.2* 15.2*  HGB 7.9* 8.5* 7.8* 7.7* 7.6*  HCT 23.6* 24.6* 23.4* 22.5* 22.1*  MCV 96.7 95.3 96.3 95.3 96.1  PLT 113* 118* 117* 125* [id number]*   Basic Metabolic Panel:  Recent Labs Lab 07/16/15 1200  07/17/15 1145 07/18/15 0324 07/19/15 0437 07/20/15 0423  NA 138 139 137  137 137 139  K 4.8 6.0* 5.0  5.0 4.3 4.0  CL  --  106 101  101 101 104  CO2  --  18* 21*  21* 24 23  GLUCOSE 82 133* 142*  144* 107* 115*  BUN  --  38* 31*  32* 29* 41*  CREATININE  --  8.35* 6.86*  6.79* 5.85* 8.06*  CALCIUM  --  8.7* 8.6*  8.6* 8.4* 8.6*  PHOS  --   --  5.9*  --  3.9   GFR: Estimated Creatinine Clearance: 8.8 mL/min (by C-G formula based on Cr of 8.06). Liver Function Tests:  Recent Labs Lab 07/17/15 1145 07/18/15 0324 07/19/15 0437 07/20/15 0423  AST 27  --  39  --   ALT 11*  --  9*  --   ALKPHOS 67  --  59  --   BILITOT 0.4  --  1.0  --   PROT 7.0  --  6.4*  --   ALBUMIN 3.6 3.3* 3.3* 3.2*   No results for input(s): LIPASE, AMYLASE in the last 168 hours. No results for input(s): AMMONIA in the last 168 hours. Coagulation Profile:  Recent Labs Lab 07/17/15 1145 07/18/15 1429 07/19/15 0437 07/20/15 0423  INR 1.42 1.50* 1.81* 1.45   Cardiac Enzymes:  Recent Labs Lab 07/17/15 1145 07/17/15 2205 07/18/15 0324 07/18/15 0928 07/18/15 1429  TROPONINI 0.06* 0.14* 0.18* 0.19* 0.18*  BNP (last 3 results) No results for input(s): PROBNP in the last 8760 hours. HbA1C: No results for input(s): HGBA1C in the last 72 hours. CBG:  Recent Labs Lab 07/17/15 1129  GLUCAP 83   Lipid Profile:  Recent Labs  07/18/15 0324  CHOL 97  HDL 32*  LDLCALC 52  TRIG 67  CHOLHDL 3.0   Thyroid Function Tests: No results for input(s): TSH, T4TOTAL, FREET4, T3FREE, THYROIDAB in the last 72 hours. Anemia Panel:  Recent Labs  07/18/15 0324 07/18/15 0928  VITAMINB12 559  --   FOLATE  --  19.8  FERRITIN 316  --   TIBC 182*  --   IRON 37*  --   RETICCTPCT  --  4.0*   Urine analysis:    Component Value Date/Time   COLORURINE YELLOW 05/23/2014 2037   APPEARANCEUR CLEAR 05/23/2014 2037   LABSPEC 1.020 05/23/2014 2037   PHURINE 6.5 05/23/2014 2037   GLUCOSEU  NEGATIVE 05/23/2014 2037   HGBUR TRACE* 05/23/2014 2037   [place] NEGATIVE 05/23/2014 2037   [place] NEGATIVE 05/23/2014 2037   PROTEINUR >300* 05/23/2014 2037   UROBILINOGEN 0.2 05/23/2014 2037   NITRITE NEGATIVE 05/23/2014 2037   LEUKOCYTESUR NEGATIVE 05/23/2014 2037   Sepsis Labs: @LABRCNTIP (procalcitonin:4,lacticidven:4)  ) Recent Results (from the past 240 hour(s))  MRSA PCR Screening     Status: None   Collection Time: 07/18/15 12:48 PM  Result Value Ref Range Status   MRSA by PCR NEGATIVE NEGATIVE Final    Comment:        The GeneXpert MRSA Assay (FDA approved for NASAL specimens only), is one component of a comprehensive MRSA colonization surveillance program. It is not intended to diagnose MRSA infection nor to guide or monitor treatment for MRSA infections.          Radiology Studies: US Carotid Bilateral  07/18/2015  CLINICAL DATA:  TIA EXAM: BILATERAL CAROTID DUPLEX ULTRASOUND TECHNIQUE: Pearline Cables scale imaging, color Doppler and duplex ultrasound were performed of bilateral carotid and vertebral arteries in the neck. COMPARISON:  None. FINDINGS: Criteria: Quantification of carotid stenosis is based on velocity parameters that correlate the residual internal carotid diameter with NASCET-based stenosis levels, using the diameter of the distal internal carotid lumen as the denominator for stenosis measurement. The following velocity measurements were obtained: RIGHT ICA:  115 cm/sec CCA:  99991111 cm/sec SYSTOLIC ICA/CCA RATIO:  1.0 DIASTOLIC ICA/CCA RATIO:  2.1 ECA:  47 cm/sec LEFT ICA:  101 cm/sec CCA:  99991111 cm/sec SYSTOLIC ICA/CCA RATIO:  0.9 DIASTOLIC ICA/CCA RATIO:  1.3 ECA:  63 cm/sec RIGHT CAROTID ARTERY: There is moderate irregular plaque along the common carotid. There is moderate irregular plaque in the bulb. Low resistance internal carotid Doppler pattern is preserved. RIGHT VERTEBRAL ARTERY:  Antegrade with a normal Doppler pattern. LEFT CAROTID ARTERY: There is  minimal smooth plaque along the common carotid. There is moderate irregular and calcified plaque in the bulb. Low resistance internal carotid Doppler pattern is preserved. LEFT VERTEBRAL ARTERY: Antegrade with a low resistance Doppler pattern. IMPRESSION: There is plaque in both internal carotid artery bulbs and common carotid arteries. Estimated stenosis is less than 50% bilaterally. Electronically Signed   By: Marybelle Killings M.D.   On: 07/18/2015 10:57   Dg Chest Port 1 View  07/18/2015  CLINICAL DATA:  Patient with history of end-stage renal disease. EXAM: PORTABLE CHEST 1 VIEW COMPARISON:  Chest radiograph 05/30/2015. FINDINGS: Monitoring leads overlie the patient. Inferior approach central venous catheter present with tip terminating at the  level of the superior vena cava. Stable cardiac and mediastinal contours. Low lung volumes. Interval development bibasilar airspace opacities. Circumferential lucency right upper hemi thorax. IMPRESSION: Circumferential lucency right upper hemi thorax likely secondary to skin fold. Small right apical pneumothorax not excluded. Recommend repeat radiograph with improved aeration. Low lung volumes with basilar airspace opacities favored represent atelectasis. These results will be called to the ordering clinician or representative by the Radiologist Assistant, and communication documented in the PACS or zVision Dashboard. Electronically Signed   By: Lovey Newcomer M.D.   On: 07/18/2015 10:03   Dg Chest Port 1v Same Day  07/18/2015  CLINICAL DATA:  70 year old male with history of abnormal chest x-ray. Followup study. EXAM: PORTABLE CHEST 1 VIEW COMPARISON:  Chest x-ray 07/18/2015. FINDINGS: Lung volumes are low. Bibasilar areas of subsegmental atelectasis. No consolidative airspace disease. No pleural effusions. No pneumothorax (previously suspected skin fold in the apex of the right hemithorax has resolved). No pulmonary nodule or mass noted. Pulmonary vasculature and the  cardiomediastinal silhouette are within normal limits. PermCath from IVC approach with tip terminating in the superior vena cava. IMPRESSION: 1. No pneumothorax. 2. Low lung volumes with bibasilar subsegmental atelectasis. Electronically Signed   By: Vinnie Langton M.D.   On: 07/18/2015 19:31        Scheduled Meds: .  stroke: mapping our early stages of recovery book   Does not apply Once  . sodium chloride   Intravenous Once  . sodium chloride   Intravenous Once  . ampicillin-sulbactam (UNASYN) IV  3 g Intravenous Q24H  . antiseptic oral rinse  7 mL Mouth Rinse BID  . atorvastatin  80 mg Oral q1800  . citalopram  10 mg Oral Daily  . epoetin (EPOGEN/PROCRIT) injection  10,000 Units Intravenous Q M,W,F-HD  . ezetimibe  10 mg Oral QHS  . feeding supplement (NEPRO CARB STEADY)  237 mL Oral BID BM  . pantoprazole  40 mg Oral Daily  . polyethylene glycol  17 g Oral BID  . white petrolatum   Topical BID   Continuous Infusions: . dextrose 5 % and 0.9% NaCl 10 mL/hr at 07/20/15 0800     LOS: 2 days    Time spent: 35Minutes.    Rexene Alberts, MD [facility] Pager [phone]  If 7PM-7AM, please contact night-coverage [email or web address] Password [facility] 07/20/2015, 8:42 AM

## 2015-07-20 NOTE — Progress Notes (Signed)
Dialysis nurse called. She is on her way in.

## 2015-07-20 NOTE — Progress Notes (Signed)
Pharmacy Antibiotic Note  [name] is a 70 y.o. male admitted on 07/17/2015 with pneumonia.  Pharmacy has been consulted for unasyn dosing. We will also start vancomycin today as his WBCs are elevated  Plan: Vancomycin 1500 mg IV X 1 then f/u 750mg  after each HD Cont Unasyn 3 gm IV q24 hours F/u cultures and clinical course  Height: 6\' 3"  (190.5 cm) Weight: 163 lb 5.8 oz (74.1 kg) IBW/kg (Calculated) : 84.5  Temp (24hrs), Avg:99.9 F (37.7 C), Min:99.2 F (37.3 C), Max:100.5 F (38.1 C)   Recent Labs Lab 07/17/15 1145 07/18/15 0324  07/19/15 0437 07/19/15 1208 07/19/15 1839 07/20/15 0005 07/20/15 0423  WBC 9.0 8.4  < > 19.8* 20.6* 17.9* 16.2* 15.2*  CREATININE 8.35* 6.86*  6.79*  --  5.85*  --   --   --  8.06*  < > = values in this interval not displayed.  Estimated Creatinine Clearance: 8.8 mL/min (by C-G formula based on Cr of 8.06).    No Known Allergies  Antimicrobials this admission: 4/29 unasyn >>  4/30 Vanc>>  Cultures: 5/1 Blood cultures: pending 4/29 MRSA PCR: negative  Thank you for allowing pharmacy to be a part of this patient's care.  Isac Sarna, BS Pharm D, [place] Clinical Pharmacist Pager [phone] 07/20/2015 9:23 AM

## 2015-07-20 NOTE — Procedures (Signed)
   HEMODIALYSIS TREATMENT NOTE:  Treatment delayed due to schedule conflict.  3.5 hour heparin-free dialysis completed via right femoral tunneled catheter. Exit site unremarkable. Goal MET: Able to remove 2.1 liters without interruption in ultrafiltration. All blood was returned.  Report given to James Daniels, RN.   , RN, CDN 

## 2015-07-20 NOTE — Progress Notes (Signed)
Dialysis nurse in room.

## 2015-07-20 NOTE — Evaluation (Signed)
Clinical/Bedside Swallow Evaluation Patient Details  Name: [name] MRN: [id number] Date of Birth: January 15, 1946  Today's Date: 07/20/2015 Time: SLP Start Time (ACUTE ONLY): 1525 SLP Stop Time (ACUTE ONLY): 1557 SLP Time Calculation (min) (ACUTE ONLY): 32 min  Past Medical History:  Past Medical History  Diagnosis Date  . Hypertension   . COPD (chronic obstructive pulmonary disease) ([place])   . Hyperlipidemia   . Cardiomyopathy     EF of 30% per echo in June of 2012; admitted with congestive heart failure; nonobstructive CAD on cath in 08/2010  . History of noncompliance with medical treatment   . Cerebrovascular disease 2011    CVA  in 02/2010-only deficit is decreased vision in  right eye  . Alcohol abuse     wife states he was never heavy drinker  . Tobacco abuse     40 pack years  . Abdominal aortic aneurysm ([place])     Stent graft repair  . Chronic systolic CHF (congestive heart failure) ([place])   . Leg pain   . CAD (coronary artery disease)   . Myocardial infarction acute ([place]) 10/01/11  . Effusion of right knee joint 12/06/2012  . Anemia   . Atrial fibrillation ([place])   . Ataxic gait   . Weakness generalized   . Frequent falls   . Forgetfulness   . Stroke [facility]) 2011    decreased vision of right eye    Most recent 05/23/14 - bleed - has a drag to his left leg  . Depression   . Anxiety     Takes Citalopram  . Arthritis     knees  . Dementia     early onset  . Enlarged prostate   . GERD (gastroesophageal reflux disease)   . Constipation   . Chronic kidney disease (CKD)     dialysis MWF at [facility] in [place]   Past Surgical History:  Past Surgical History  Procedure Laterality Date  . Colonoscopy w/ polypectomy  2006    Dr. Tamala Julian: anal fissure, sessile adenomatous polyp, diverticulosis  . Esophagogastroduodenoscopy  11/15/2010    Procedure: ESOPHAGOGASTRODUODENOSCOPY (EGD);  Surgeon: Dorothyann Peng, MD;  Location: [facility];  Service: Endoscopy;  Laterality: N/A;  with  propofol sedation; procedure start @ 0813  . Flexible sigmoidoscopy  11/15/2010    Procedure: FLEXIBLE SIGMOIDOSCOPY;  Surgeon: Dorothyann Peng, MD;  Location: [facility];  Service: Endoscopy;  Laterality: N/A;  with propofol sedation; ended @ 0808  . Polypectomy  12/13/2010    Procedure: POLYPECTOMY;  Surgeon: Dorothyann Peng, MD;  Location: [facility];  Service: Endoscopy;  Laterality: N/A;  right colon, cecal, transverse, and sigmoid polypectomy  . Cardiac catheterization  10/04/11    Normal left main, 95% pLAD stenosis with ulceration s/p successful BMS placement, 30% segmental plaque beyond this, dLAD after D2 with 50% plaquing, then focal 70% lesion, 80% focal short D2 stenosis, 30% pOM2 lesion, 30-40% mOM3 stenosis, Shephard's crook region of RCA with 50% lesion, mRCA 50-60% lesion and mild dRCA irregularities.   . Coronary stent placement  10/04/11  . Abdominal aortic aneurysm repair w/ endoluminal graft      01/16/2008  . Left heart catheterization with coronary angiogram N/A 10/04/2011    Procedure: LEFT HEART CATHETERIZATION WITH CORONARY ANGIOGRAM;  Surgeon: Hillary Bow, MD;  Location: [facility] CATH LAB;  Service: Cardiovascular;  Laterality: N/A;  . Av fistula placement Left 07/07/2014    Procedure: Creation of Left Arm ARTERIOVENOUS Fistula;  Surgeon: Arvilla Meres  Early, MD;  Location: [facility] OR;  Service: Vascular;  Laterality: Left;  . Insertion of dialysis catheter Right 08/05/2014    Procedure: INSERTION OF DIALYSIS CATHETER Right internal Jugular;  Surgeon: Conrad Pamlico, MD;  Location: [place];  Service: Vascular;  Laterality: Right;  . Port-a-cath removal    . Peripheral vascular catheterization Left 01/15/2015    Procedure: Fistulagram;  Surgeon: Conrad [place], MD;  Location: [place] CV LAB;  Service: Cardiovascular;  Laterality: Left;  arm  . Peripheral vascular catheterization Left 01/15/2015    Procedure: Peripheral Vascular Intervention;  Surgeon: Conrad McIntosh, MD;  Location: [place] CV LAB;   Service: Cardiovascular;  Laterality: Left;  ARM VEINOUS PTA  . Insertion of dialysis catheter Left 02/13/2015    Procedure: INSERTION OF DIALYSIS CATHETER AND CENTRAL VENOGRAM;  Surgeon: Serafina Mitchell, MD;  Location: [place];  Service: Vascular;  Laterality: Left;  . Peripheral vascular catheterization N/A 04/07/2015    Procedure: A/V Shuntogram/Fistulagram;  Surgeon: Conrad Oberlin, MD;  Location: [place] CV LAB;  Service: Cardiovascular;  Laterality: N/A;  . Revison of arteriovenous fistula Left [id number]    Procedure: PLICATION OF LEFT ARM PSEUDOANEURYSM;  Surgeon: Conrad [place], MD;  Location: [place];  Service: Vascular;  Laterality: Left;  . Patch angioplasty Left 04/09/2015    Procedure: PATCH ANGIOPLASTY;  Surgeon: Conrad Gordon, MD;  Location: [place];  Service: Vascular;  Laterality: Left;  . Ligation of arteriovenous  fistula Left 04/15/2015    Procedure: LIGATION OF BRACHIOCEPHALIC ARTERIOVENOUS  FISTULA;  Surgeon: Mal Misty, MD;  Location: [place];  Service: Vascular;  Laterality: Left;  . Exchange of a dialysis catheter Left 05/14/2015    Procedure: EXCHANGE OF A DIALYSIS CATHETER-LEFT INTERNAL JUGULAR VEIN;  Surgeon: Conrad Taft, MD;  Location: [place];  Service: Vascular;  Laterality: Left;  . Removal of a dialysis catheter Left 05/30/2015    Procedure: REMOVAL OF A DIALYSIS CATHETER LEFT INTERNAL JUGULAR;  Surgeon: Conrad Gypsy, MD;  Location: [place];  Service: Vascular;  Laterality: Left;  . Insertion of dialysis catheter Right 05/30/2015    Procedure: INSERTION OF DIALYSIS CATHETER;  Surgeon: Conrad Blodgett, MD;  Location: [place];  Service: Vascular;  Laterality: Right;  . Hematoma evacuation Left 05/30/2015    Procedure: EVACUATION NECK HEMATOMA;  Surgeon: Conrad , MD;  Location: [place];  Service: Vascular;  Laterality: Left;  . Capd insertion Bilateral 07/16/2015    Procedure: LAPAROSCOPIC INSERTION CONTINUOUS AMBULATORY PERITONEAL DIALYSIS  (CAPD) CATHETER;  Surgeon: Michael Boston,  MD;  Location: [place];  Service: General;  Laterality: Bilateral;  . Inguinal hernia repair Right 07/16/2015    Procedure: LAPAROSCOPIC RIGHT INGUINAL HERNIA WITH MESH;  Surgeon: Michael Boston, MD;  Location: [place];  Service: General;  Laterality: Right;   HPI:  [name] is a 70 y.o. 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 The patient recently underwent peritoneal dialysis placement by general surgery yesterday. The patient underwent the procedure without any difficulty. The atrial catheter was placed due to end-stage renal disease with underlying venous access. This morning the patient awoke and was noticed to have a left sided facial droop by his wife. Patient was unable to verbalize very well during that time.Marland Kitchen  When she exited the room and returned, the patient was lying flat on his back and appeared to be diaphoretic and not responding very well. His wife called EMS who brought the patient in his construct. Principal problem noted as TIA. Head Ct and MRI both (-) for acute changes. Pt now with down trending roponin levels, however H&H remain low at 7.6/22.1. Patient started on Unasyn for probable aspiration pneumonia-noted on CT scan.   Assessment / Plan / Recommendation Clinical Impression  Pt was evaluated at bedside noted laying back at approximately 45 degrees holding a cup; oral mech revealed a weak volitional cough. Pt was educated of the importance of sitting at 90 degrees for all PO intake for "safe swallowing". Pt consumed thin liquids with a noted audible swallow, occasional belching and one delayed cough but presented with clear vocal quality and no overt s/sx of aspiration. With puree and regular textures the pt demonstrated uncoordinated and prolonged oral stage with  reported difficulty initiating the swallow. When cued to alternate bites with sips swallow became more timely with decreased difficulty. Recommend D2 (ground)/thin liquids and meds to be crushed in puree. Effective strategies recommended: alternate bites/sips, SMALL BITES/SIPS, positioning sitting at 90 degrees for all PO intake. ST to follow up while in acute setting to ensure pt is tolerating least restrictive diet.     Aspiration Risk  Mild aspiration risk    Diet Recommendation Dysphagia 2 (Fine chop);Thin liquid   Liquid Administration via: Straw;Cup Medication Administration: Crushed with puree Supervision: Full supervision/cueing for compensatory strategies Compensations: Minimize environmental distractions;Slow rate;Small sips/bites;Follow solids with liquid Postural Changes: Seated upright at 90 degrees    Other  Recommendations Oral Care Recommendations: Oral care BID   Follow up Recommendations       Frequency and Duration min 2x/week  2 weeks       Prognosis Prognosis for Safe Diet Advancement: [place] Date of Onset: 07/17/15 HPI: [name] is a 70 y.o. 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 The patient recently underwent peritoneal dialysis placement by general surgery yesterday. The patient underwent the procedure without any difficulty. The atrial catheter was placed due to end-stage renal disease with underlying venous access. This morning the patient awoke and was noticed to have a left sided facial droop by his wife. Patient was unable to verbalize very well during that time.. When she exited the room and returned, the patient was lying flat on his back and appeared to be diaphoretic and not responding very well. His  wife called EMS who brought the patient in his construct. Principal problem noted as TIA. Head Ct and MRI both (-) for acute changes. Pt now with down trending roponin levels, however H&H remain low at 7.6/22.1. Patient started on Unasyn for probable aspiration pneumonia-noted on CT scan. Type of Study: Bedside Swallow Evaluation Previous Swallow Assessment: BSE -2014 Diet Prior to this Study: Nectar-thick liquids;Dysphagia 2 (chopped) Temperature Spikes Noted: Yes Respiratory Status: Room air History of Recent Intubation: No Behavior/Cognition: Alert;Cooperative;Pleasant mood Oral Cavity Assessment: Dry Oral Cavity - Dentition: Dentures, top;Dentures, bottom Vision: Functional for self-feeding Self-Feeding Abilities: Able to feed self;Needs assist Patient Positioning: Upright in bed Baseline Vocal Quality: Normal Volitional Cough: Weak Volitional Swallow: Able to elicit  Oral/Motor/Sensory Function Overall Oral Motor/Sensory Function: Within functional limits   Ice Chips Ice chips: Within functional limits   Thin Liquid Thin Liquid: Impaired Presentation: Cup;Straw Pharyngeal  Phase Impairments: Suspected delayed Swallow;Cough - Delayed    Nectar Thick Nectar Thick Liquid: Not tested   Honey Thick Honey Thick Liquid: Not tested   Puree Puree: Impaired Presentation: Spoon;Self Fed Pharyngeal Phase Impairments: Unable to trigger swallow (required alternating sips to trigger)   Solid     [name] H. Roddie Mc, CCC-SLP Speech Language Pathologist  Solid: Impaired Presentation: Self Fed Oral Phase Impairments: Impaired mastication Oral Phase Functional Implications: Prolonged oral transit;Impaired mastication        Janann Boeve H Demarie Uhlig 07/20/2015,4:10 PM

## 2015-07-21 DIAGNOSIS — J9601 Acute respiratory failure with hypoxia: Secondary | ICD-10-CM

## 2015-07-21 LAB — CBC
HEMATOCRIT: 24.5 % — AB (ref 39.0–52.0)
HEMOGLOBIN: 8.1 g/dL — AB (ref 13.0–17.0)
MCH: 32 pg (ref 26.0–34.0)
MCHC: 33.1 g/dL (ref 30.0–36.0)
MCV: 96.8 fL (ref 78.0–100.0)
PLATELETS: 141 10*3/uL — AB (ref 150–400)
RBC: 2.53 MIL/uL — AB (ref 4.22–5.81)
RDW: 16.7 % — AB (ref 11.5–15.5)
WBC: 13.5 10*3/uL — ABNORMAL HIGH (ref 4.0–10.5)

## 2015-07-21 LAB — RENAL FUNCTION PANEL
ALBUMIN: 3.2 g/dL — AB (ref 3.5–5.0)
ANION GAP: 12 (ref 5–15)
BUN: 26 mg/dL — ABNORMAL HIGH (ref 6–20)
CALCIUM: 8.5 mg/dL — AB (ref 8.9–10.3)
CO2: 26 mmol/L (ref 22–32)
Chloride: 100 mmol/L — ABNORMAL LOW (ref 101–111)
Creatinine, Ser: 5.67 mg/dL — ABNORMAL HIGH (ref 0.61–1.24)
GFR calc non Af Amer: 9 mL/min — ABNORMAL LOW (ref >60)
GFR, EST AFRICAN AMERICAN: 10 mL/min — AB (ref >60)
Glucose, Bld: 97 mg/dL (ref 65–99)
PHOSPHORUS: 3.3 mg/dL (ref 2.5–4.6)
POTASSIUM: 3.5 mmol/L (ref 3.5–5.1)
SODIUM: 138 mmol/L (ref 135–145)

## 2015-07-21 LAB — PROTIME-INR
INR: 1.25 (ref 0.00–1.49)
PROTHROMBIN TIME: 15.9 s — AB (ref 11.6–15.2)

## 2015-07-21 MED ORDER — ALBUTEROL SULFATE (2.5 MG/3ML) 0.083% IN NEBU
2.5000 mg | INHALATION_SOLUTION | Freq: Four times a day (QID) | RESPIRATORY_TRACT | Status: DC
  Administered 2015-07-21 (×2): 2.5 mg via RESPIRATORY_TRACT
  Filled 2015-07-21 (×2): qty 3

## 2015-07-21 MED ORDER — POLYETHYLENE GLYCOL 3350 17 G PO PACK: 17.0000 g | PACK | Freq: Every day | ORAL | Status: DC | PRN

## 2015-07-21 MED ORDER — ACETYLCYSTEINE 20 % IN SOLN
2.0000 mL | Freq: Two times a day (BID) | RESPIRATORY_TRACT | Status: AC
  Administered 2015-07-21 (×2): 2 mL via RESPIRATORY_TRACT
  Filled 2015-07-21 (×4): qty 4

## 2015-07-21 MED ORDER — ALBUTEROL SULFATE (2.5 MG/3ML) 0.083% IN NEBU
2.5000 mg | INHALATION_SOLUTION | Freq: Three times a day (TID) | RESPIRATORY_TRACT | Status: DC
  Administered 2015-07-22 – 2015-07-25 (×11): 2.5 mg via RESPIRATORY_TRACT
  Filled 2015-07-21 (×11): qty 3

## 2015-07-21 MED ORDER — ALBUTEROL SULFATE (2.5 MG/3ML) 0.083% IN NEBU
INHALATION_SOLUTION | RESPIRATORY_TRACT | Status: AC
  Administered 2015-07-21: 2.5 mg
  Filled 2015-07-21: qty 3

## 2015-07-21 NOTE — Progress Notes (Signed)
OT Screen  Patient Details Name: [name] MRN: [id number] DOB: [birth year]   OT Screen:     Reason evaluation not completed: Chart reviewed, spoke with PT, discussed status with pt at length. Pt reports he has been in current state of weakness for "a long time now, maybe even a year or more." Pt remains in the bed the majority of the time, getting out of bed to sit in a recliner or on the couch approximately once per week or less. Pt reports wife provides max-total assistance with all ADL tasks including sponge bathing at bed level, toileting tasks, all dressing tasks, grooming, meal preparation, medication management, and functional mobility. Pt reports he has all the DME he needs at this point. Pt uses a walker when he does get out of bed, only walks short distances and takes multiple rest breaks. Wife not available to confirm pt statements, however wife did inform PT she assists with all ADL tasks.   No further OT services recommended at this time as pt requires max-total assistance with ADL tasks at his baseline.   Guadelupe Sabin, OTR/L  [phone]  07/21/2015, 2:18 PM

## 2015-07-21 NOTE — Plan of Care (Signed)
Problem: Nutrition: Goal: Dietary intake will improve Outcome: Not Progressing Pt still has poor appetite and needs assistance with intake

## 2015-07-21 NOTE — Progress Notes (Addendum)
PROGRESS NOTE    [name]  [id number] DOB: Aug 05, 1945 DOA: 07/17/2015 PCP: [name], MD Outpatient Specialists:  Danie Binder, MD (Gastroenterology)  Serafina Mitchell, MD (Vascular Surgery)  Herminio Commons, MD (Cardiology)  Fran Lowes, MD (Nephrology)  Michael Boston, MD (General Surgery)    Brief Narrative:  Patient is a 70 year old man with history end-stage renal disease on HD, s/p laparoscopic peritoneal dialysis catheter insertion and inguinal hernia repair on the right by Dr. 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 In the ED, he was afebrile and hemodynamically stable. His potassium was elevated at 6.0, BUN 38, CO2 18, and creatinine 8.35; hemoglobin 8.7; Troponin I 0.06. CT of the head was negative for acute infarct, but with extensive chronic ischemic changes and chronic microhemorrhage in the basal ganglia bilaterally and right pons. -Day following admission, the patient became more hypotensive. Follow-up hemoglobin fell to 6.6. CT scan of the abdomen and pelvis showed a retroperitoneal hematoma. Patient was transfused-total of 4 units prbc altogether.. Case discussed with general surgeon, Dr. Johney Maine on 07/18/15 via phone call. (See his note)-he recommended transfusion, but no surgical intervention was needed. -Patient started on Unasyn for probable aspiration pneumonia-noted on CT scan. Vancomycin added for fever and additional coverage.Nephrology consulted for hemodialysis. -Abdominal dressing and PD catheter flushes will be addressed in the outpatient setting by the PD nurse. -If patient's hemoglobin continues to drop, would order another abdominal CT.   Assessment & Plan:   Principal Problem:   TIA  (transient ischemic attack) Active Problems:   Hyperkalemia   Retroperitoneal hemorrhage   Acute blood loss anemia   Elevated troponin I level   Hypotension   Aspiration pneumonia (HCC)   Coagulopathy (HCC)   Acute respiratory failure with hypoxia (HCC)   End-stage renal disease on hemodialysis (HCC)   Thrombocytopenia (HCC)   Fecal impaction (HCC)   Essential hypertension   1. Left facial droop. Patient has a history of CVA and is treated chronically with aspirin, which was continued initially. Diagnostic studies revealed negative acute infarction, but with chronic microhemorrhage in the basal cannula bilaterally and the right pons per MRI of the brain; no significant ICA stenosis bilaterally per carotid ultrasound; 2-D echocardiogram revealed an EF of 55-60% and no obvious cardiac source of emboli. -Is likely that the patient had a TIA, but facial droop from hyperkalemia could have contributed. -His facial droop has resolved. Aspirin is being held due to the retroperitoneal bleed.  Retroperitoneal hemorrhage. Patient is status post laparoscopic peritoneal dialysis catheter insertion on 07/16/15 by Dr. Johney Maine. On admission, the patient's hemoglobin was 8.7, but he was relatively hypotensive. CT of his abdomen and pelvis was ordered for a drop in his hemoglobin to 6.6. It revealed retroperitoneal hematoma on the right and small volume pneumoperitoneum presumably related to the peritoneal catheter placement. -Findings were discussed with surgeon, Dr. Johney Maine by phone on 4/29-see his note. He reviewed the patient's CT scan. He noted that the hematoma was not large. He recommended transfusion and to continue to monitor the patient here at [facility]. -Patient has minimal abdominal tenderness; abdomen is less distended. -Abdominal dressing and peritoneal catheter care protocol will be deferred to the outpatient setting to the PD nurse. -Consider a follow-up abdominal  CT if he develops abdominal pain  and/or if his hemoglobin continues to decrease.  Acute blood loss anemia; history of anemia of chronic disease. Patient's hemoglobin dropped to 6.6 from 8.7. As above, patient has a retroperitoneal hemorrhage. -Aspirin and Lovenox were discontinued. -Anemia panel revealed a total iron of 37, ferritin 316, and B12 559. -He was transfused 2 units of packed blood cells with hemodialysis on 4/29 and another 2 units on 07/20/15.Marland Kitchen Hemoglobin improved to 8.1. -We'll continue to monitor for need for additional transfusions.  Thrombocytopenia. Patient's platlet count was 143 on admission. It had fallen to a nadir of 106. Aspirin was held. Heparin during dialysis and Lovenox were discontinued following dx of of retroperitoneal hematoma. -Platelet count has improved. Vitamin B12 was within normal limits. Vitamin B12 was within normal limits. - Etiology is likely secondary to consumption thrombocytopenia from the GI bleeding and/or chronic heparin given during hemodialysis. -We'll continue to monitor closely.  Coagulopathy: INR later assessed and found to be 1.5>> 1.81. Etiology is not clear, but could be secondary to previous heparin given during dialysis and Lovenox given for DVT prophylaxis-both now discontinued. Also consider coagulopathy from the infection. -Vitamin K subcutaneous given 1.. His INR has improved to 1.25  Hypotension>> Essential hypertension The patient's blood pressures were in the [id number] systolically. Amlodipine was discontinued following admission. He was given IV fluid boluses cautiously with some improvement in his blood pressure. Packed red blood cells were transfused. -His blood pressure has increased progressively. Amlodipine restarted. Nephrology added clonidine twice a day.  Elevated troponin I. Patient's troponin I was 0.06 on admission. It has ranged from 0.14-0.19. The elevation is thought to be secondary to demand ischemia from hypotension and acute blood loss. He denies  chest pain. -Echo noted for preserved LV EF of 55-60% and no wall motion abnormalities.  Aspiration pneumonia causing hypoxic respiratory failure. CT scan of the patient's abdomen/pelvis revealed right lower lobe bronchus mucus plugging and aspirated material, query aspiration pneumonitis versus pneumonia. -White blood cell count increased to 24.5. Blood cultures were ordered and have been negative to date. Unasyn was started. Vancomycin was added for persistent fever. -Given the patient's TIA in history of previous stroke, dysphagia is likely. His diet was downgraded to dysphagia 2 with thin liquids, following ST evaluation. -Patient has been intermittently hypoxic. He has rare wheezes on exam and noted mucus plugging on the CT. -We'll add albuterol neb every 6 hours and Mucomyst every 12 hours 2. Will hold on steroid therapy.  Question of pneumothorax on chest x-ray. Follow-up chest x-ray on 4/29 revealed no evidence of pneumothorax.  End-stage renal disease with hyperkalemia. Nephrology was consulted. Hemodialysis was attempted on 07/17/15, but it had to be canceled due to hypotension. -Patient's blood pressure has improved and he has undergone hemodialysis 2. -His potassium level has improved. -She will continue on hemodialysis until peritoneal dialysis is established in the outpatient setting.  Rectal fecal impaction Noted on the CT of the abdomen and pelvis. MiraLAX started. Soapsuds enema given with good results. Will change MiraLAX to when necessary as he has had several loose bowel movements.  Hiccups. Low dose when necessary Thorazine started.   DVT prophylaxis: Lovenox>> d/c'd /4/29>> SCDs Code Status: Full code. Family Communication: Discussion with wife on 07/19/15 Disposition Plan: Discharge when clinically appropriate, likely in a few days. Patient may need SNF placement.   Consultants:   Nephrology  Phone call conversation with general surgeon, Dr. Johney Maine on  07/18/15.  Procedures:  1.) Hemodialysis 2.)  2-D echocardiogram 07/18/2015- Study Conclusions - Left ventricle: The cavity size was normal. Systolic function was  normal. The estimated ejection fraction was in the range of 55%  to 60%. Wall motion was normal; there were no regional wall  motion abnormalities. Doppler parameters are consistent with  abnormal left ventricular relaxation (grade 1 diastolic  dysfunction). - Ventricular septum: Septal motion showed abnormal function and  dyssynergy. IVCD. - Aortic valve: Trileaflet; mildly thickened, mildly calcified  leaflets. There was moderate regurgitation. - Pulmonary arteries: Systolic pressure was mildly increased. PA  peak pressure: 39 mm Hg (S). Impressions: - No cardiac source of emboli was indentified. Compared to the prior study, there has been no significant interval change.  Antimicrobials:  vancomycin: 07/19/15>> Unasyn 07/18/15>>   Subjective: Patient has no complaints of abdominal pain. He denies chest pain or shortness of breath. His hiccups have subsided.  Objective: Filed Vitals:   07/21/15 0500 07/21/15 0700 07/21/15 0800 07/21/15 0826  BP:  157/87 163/100   Pulse:  98 99   Temp:    99.3 F (37.4 C)  TempSrc:    Oral  Resp:  21 18   Height:      Weight: 74.1 kg (163 lb 5.8 oz)     SpO2:   92%     Intake/Output Summary (Last 24 hours) at 07/21/15 0901 Last data filed at 07/21/15 0500  Gross per 24 hour  Intake    250 ml  Output   2101 ml  Net  -1851 ml   Filed Weights   07/18/15 1345 07/20/15 1916 07/21/15 0500  Weight: 74.1 kg (163 lb 5.8 oz) 78.9 kg (173 lb 15.1 oz) 74.1 kg (163 lb 5.8 oz)    Examination:  General exam: Appears calm and comfortable; no acute distress.  Respiratory system: Rare expiratory wheezes; breathing nonlabored. Cardiovascular system: S1 & S2 heard with  2/6 systolic murmur. Trace pedal edema. Gastrointestinal system: Abdomen with postoperative bandage in place,  Dried blood tinged; less distended with scant tenderness; relatively soft; hypoactive bowel sounds. Central nervous system: Alert and oriented to himself and place. Speech is mostly clear. No obvious facial droop or cranial nerve deficits. Extremities: No acute hot red joints; no edema. Skin: Stasis dermatitis-like changes on his lower extremities bilaterally. Psychiatry: Flat affect. Speech is clear.    Data Reviewed:   CBC:  Recent Labs Lab 07/19/15 1208 07/19/15 1839 07/20/15 0005 07/20/15 0423 07/21/15 0448  WBC 20.6* 17.9* 16.2* 15.2* 13.5*  HGB 8.5* 7.8* 7.7* 7.6* 8.1*  HCT 24.6* 23.4* 22.5* 22.1* 24.5*  MCV 95.3 96.3 95.3 96.1 96.8  PLT 118* 117* 125* 130* [id number]*   Basic Metabolic Panel:  Recent Labs Lab 07/17/15 1145 07/18/15 0324 07/19/15 0437 07/20/15 0423 07/21/15 0448  NA 139 137  137 137 139 138  K 6.0* 5.0  5.0 4.3 4.0 3.5  CL 106 101  101 101 104 100*  CO2 18* 21*  21* 24 23 26   GLUCOSE 133* 142*  144* 107* 115* 97  BUN 38* 31*  32* 29* 41* 26*  CREATININE 8.35* 6.86*  6.79* 5.85* 8.06* 5.67*  CALCIUM 8.7* 8.6*  8.6* 8.4* 8.6* 8.5*  PHOS  --  5.9*  --  3.9 3.3   GFR: Estimated Creatinine Clearance: 12.5 mL/min (by C-G formula based on Cr of 5.67). Liver Function Tests:  Recent Labs Lab 07/17/15 1145 07/18/15 0324 07/19/15 0437 07/20/15 0423 07/21/15 0448  AST 27  --  39  --   --  ALT 11*  --  9*  --   --   ALKPHOS 67  --  59  --   --   BILITOT 0.4  --  1.0  --   --   PROT 7.0  --  6.4*  --   --   ALBUMIN 3.6 3.3* 3.3* 3.2* 3.2*   No results for input(s): LIPASE, AMYLASE in the last 168 hours. No results for input(s): AMMONIA in the last 168 hours. Coagulation Profile:  Recent Labs Lab 07/17/15 1145 07/18/15 1429 07/19/15 0437 07/20/15 0423 07/21/15 0448  INR 1.42 1.50* 1.81* 1.45 1.25   Cardiac Enzymes:  Recent Labs Lab 07/17/15 1145 07/17/15 2205 07/18/15 0324 07/18/15 0928 07/18/15 1429  TROPONINI 0.06* 0.14*  0.18* 0.19* 0.18*   BNP (last 3 results) No results for input(s): PROBNP in the last 8760 hours. HbA1C: No results for input(s): HGBA1C in the last 72 hours. CBG:  Recent Labs Lab 07/17/15 1129  GLUCAP 83   Lipid Profile: No results for input(s): CHOL, HDL, LDLCALC, TRIG, CHOLHDL, LDLDIRECT in the last 72 hours. Thyroid Function Tests:  Recent Labs  07/20/15 0453  TSH 2.960   Anemia Panel:  Recent Labs  07/18/15 0928  FOLATE 19.8  RETICCTPCT 4.0*   Urine analysis:    Component Value Date/Time   COLORURINE YELLOW 05/23/2014 2037   APPEARANCEUR CLEAR 05/23/2014 2037   LABSPEC 1.020 05/23/2014 2037   PHURINE 6.5 05/23/2014 2037   GLUCOSEU NEGATIVE 05/23/2014 2037   HGBUR TRACE* 05/23/2014 2037   [place] NEGATIVE 05/23/2014 2037   [place] NEGATIVE 05/23/2014 2037   PROTEINUR >300* 05/23/2014 2037   UROBILINOGEN 0.2 05/23/2014 2037   NITRITE NEGATIVE 05/23/2014 2037   LEUKOCYTESUR NEGATIVE 05/23/2014 2037   Sepsis Labs: @LABRCNTIP (procalcitonin:4,lacticidven:4)  ) Recent Results (from the past 240 hour(s))  MRSA PCR Screening     Status: None   Collection Time: 07/18/15 12:48 PM  Result Value Ref Range Status   MRSA by PCR NEGATIVE NEGATIVE Final    Comment:        The GeneXpert MRSA Assay (FDA approved for NASAL specimens only), is one component of a comprehensive MRSA colonization surveillance program. It is not intended to diagnose MRSA infection nor to guide or monitor treatment for MRSA infections.   Culture, blood (Routine X 2) w Reflex to ID Panel     Status: None (Preliminary result)   Collection Time: 07/20/15  7:30 AM  Result Value Ref Range Status   Specimen Description BLOOD  Final   Special Requests NONE  Final   Culture NO GROWTH < 12 HOURS  Final   Report Status PENDING  Incomplete  Culture, blood (Routine X 2) w Reflex to ID Panel     Status: None (Preliminary result)   Collection Time: 07/20/15  7:36 AM  Result Value Ref  Range Status   Specimen Description BLOOD  Final   Special Requests NONE  Final   Culture NO GROWTH < 12 HOURS  Final   Report Status PENDING  Incomplete         Radiology Studies: No results found.      Scheduled Meds: .  stroke: mapping our early stages of recovery book   Does not apply Once  . sodium chloride   Intravenous Once  . sodium chloride   Intravenous Once  . ampicillin-sulbactam (UNASYN) IV  3 g Intravenous Q24H  . antiseptic oral rinse  7 mL Mouth Rinse BID  . atorvastatin  80 mg Oral q1800  .  citalopram  10 mg Oral Daily  . cloNIDine  0.2 mg Oral BID  . epoetin (EPOGEN/PROCRIT) injection  10,000 Units Intravenous Q M,W,F-HD  . ezetimibe  10 mg Oral QHS  . feeding supplement (NEPRO CARB STEADY)  237 mL Oral BID BM  . pantoprazole  40 mg Oral Daily  . polyethylene glycol  17 g Oral BID  . vancomycin  750 mg Intravenous Q M,W,F-HD  . white petrolatum   Topical BID   Continuous Infusions: . dextrose 5 % and 0.9% NaCl 10 mL/hr at 07/20/15 0800     LOS: 3 days    Time spent: 35Minutes.    Rexene Alberts, MD [facility] Pager [phone]  If 7PM-7AM, please contact night-coverage [email or web address] Password [email or web address] 07/21/2015, 9:01 AM

## 2015-07-21 NOTE — Progress Notes (Signed)
Subjective: Interval History: has no complaint of nausea or vomiting. His appetite however is poor according to his wife. Patient denies any difficulty breathing..  Objective: Vital signs in last 24 hours: Temp:  [98.9 F (37.2 C)-100.5 F (38.1 C)] 98.9 F (37.2 C) (05/02 0400) Pulse Rate:  [26-127] 113 (05/02 0400) Resp:  [16-22] 18 (05/02 0400) BP: (107-210)/(61-114) 130/85 mmHg (05/02 0400) SpO2:  [81 %-95 %] 84 % (05/02 0400) Weight:  [74.1 kg (163 lb 5.8 oz)-78.9 kg (173 lb 15.1 oz)] 74.1 kg (163 lb 5.8 oz) (05/02 0500) Weight change:   Intake/Output from previous day: 05/01 0701 - 05/02 0700 In: 420 [P.O.:120; I.V.:50; IV Piggyback:250] Out: 2101 [Stool:1] Intake/Output this shift: Total I/O In: -  Out: 2101 [Other:2100; Stool:1]  General appearance: alert, cooperative and no distress Resp: clear to auscultation bilaterally Cardio: regular rate and rhythm, S1, S2 normal, no murmur, click, rub or gallop GI: Patient is still with dressing. His positive bowel sound. Extremities: extremities normal, atraumatic, no cyanosis or edema  Lab Results:  Recent Labs  07/20/15 0423 07/21/15 0448  WBC 15.2* 13.5*  HGB 7.6* 8.1*  HCT 22.1* 24.5*  PLT 130* 141*   BMET:   Recent Labs  07/20/15 0423 07/21/15 0448  NA 139 138  K 4.0 3.5  CL 104 100*  CO2 23 26  GLUCOSE 115* 97  BUN 41* 26*  CREATININE 8.06* 5.67*  CALCIUM 8.6* 8.5*   No results for input(s): PTH in the last 72 hours. Iron Studies: No results for input(s): IRON, TIBC, TRANSFERRIN, FERRITIN in the last 72 hours.  Studies/Results: No results found.  I have reviewed the patient's current medications.  Assessment/Plan: Problem #1 altered mental status: Presently seems to be better. This could be secondary to TIA. Problem #2 history of fever with leukocytosis. Blood culture no growth. Presently he is on antibiotics. His white blood cell count is high but improving. Source of infection could be from  aspiration pneumonia. Problem #3 anemia: Secondary to bleeding. Patient is status post blood transfusion. His hemoglobin is stable. Problem #4: Hypertension: His blood pressure continues to fluctuate but better controlled to day. Patient was started on clonidine. Problem #5 . Retroperitoeal hematoma.Patient had Peritoneal  dialysis catheter placed and also right hernia repair. Problem #6 metabolic bone disease: His calcium and phosphorus is in range. Patient is not on a binder. Problem #7 history of COPD Problem #8 history of abdominal aortic aneurysm. Plan: 1] We'll make arrangements for patient to get dialysis tomorrow 2] we'll continue his antibiotics 3] we'll check CBC and renal panel in am   LOS: 3 days   Latika Kronick S 07/21/2015,6:43 AM

## 2015-07-21 NOTE — Evaluation (Signed)
Physical Therapy Evaluation Patient Details Name: [name] MRN: [id number] DOB: 1945-12-12 Today's Date: 07/21/2015   History of Present Illness  [name] is a 70 y.o. 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 The patient recently underwent peritoneal dialysis placement by general surgery yesterday. The patient underwent the procedure without any difficulty. The atrial catheter was placed due to end-stage renal disease with underlying venous access. This morning the patient awoke and was noticed to have a left sided facial droop by his wife. Patient was unable to verbalize very well during that time.. When she exited the room and returned, the patient was lying flat on his back and appeared to be diaphoretic and not responding very well. His wife called EMS who brought the patient in his construct. The patient began to recall things after EMS arrived. Patient shows no deficits since being in the emergency department.  Since admission pt has had low H&H, however today it is 8.1/24.5.  Pt is also suspected to have aspiration PNA.  CT of abdomen/pelvis revealed retroperitoneal hematoma.   Clinical Impression  Pt received in bed, wife arrived during tx, and pt is agreeable to PT evaluation.  Pt required Mod A for transfer supine<>sit, and sit<>stand.  Pt was able to take a few steps to transfer bed<>chair with RW, but was significantly fatigued by this.  Pt's resting BP: 162/86, HR: 89bpm, however, it significantly increased after transfer to 167/110, and HR: 102bpm.  RN notified.  Pt lives at home with his wife, who has been assisting with with ADL's, and IADL's.  Pt has all DME at this point.  Therefore, recommend that pt return home with HHPT.      Follow Up Recommendations Home  health PT    Equipment Recommendations  None recommended by PT    Recommendations for Other Services       Precautions / Restrictions Precautions Precautions: Fall Restrictions Weight Bearing Restrictions: No      Mobility  Bed Mobility Overal bed mobility: Needs Assistance Bed Mobility: Supine to Sit     Supine to sit: Mod assist     General bed mobility comments: Assisted pt advance LE's to the EOB, and then assist required to lift trunk up, and obtain upright position.   Transfers Overall transfer level: Needs assistance Equipment used: Rolling walker (2 wheeled) Transfers: Sit to/from Stand Sit to Stand: Min assist;Mod assist;From elevated surface            Ambulation/Gait Ambulation/Gait assistance: Min guard Ambulation Distance (Feet): 5 Feet Assistive device: Rolling walker (2 wheeled) Gait Pattern/deviations: Step-to pattern;Trunk flexed     General Gait Details: Fwd head posture.  Pt only able to take a few steps and was limited due to fatigue, with BP significantly elevated after mobility. (167/110)  Stairs            Wheelchair Mobility    Modified Rankin (Stroke Patients Only)       Balance Overall balance assessment: Needs assistance         Standing balance support: Bilateral upper extremity supported Standing balance-Leahy Scale: Fair                               Pertinent Vitals/Pain Pain  Assessment: No/denies pain    Home Living Family/patient expects to be discharged to:: Private residence Living Arrangements: Spouse/significant other   Type of Home: House Home Access: Stairs to enter   [employer] of Steps: 2-3 per pt Home Layout: One level Home Equipment: Environmental consultant - 2 wheels;Walker - 4 wheels;Wheelchair - manual      Prior Function Level of Independence: Needs assistance   Gait / Transfers Assistance Needed: Pt ambulates with rollator or RW.  Pt states that occasionally he is able to  walk from one end of the house to the other, but mostly room to room when he does get up.   ADL's / Homemaking Assistance Needed: Pt states that his wife assists with bathing and dressing        Hand Dominance        Extremity/Trunk Assessment   Upper Extremity Assessment: Generalized weakness           Lower Extremity Assessment: Generalized weakness         Communication      Cognition Arousal/Alertness: Awake/alert Behavior During Therapy: Flat affect Overall Cognitive Status: Within Functional Limits for tasks assessed                      General Comments      Exercises        Assessment/Plan    PT Assessment Patient needs continued PT services  PT Diagnosis Difficulty walking;Generalized weakness   PT Problem List Decreased strength;Decreased activity tolerance;Decreased balance;Decreased mobility;Decreased safety awareness  PT Treatment Interventions Gait training;Functional mobility training;Stair training;Therapeutic activities;Therapeutic exercise;Balance training;Patient/family education   PT Goals (Current goals can be found in the Care Plan section) Acute Rehab PT Goals Patient Stated Goal: Pt wants to go home.  PT Goal Formulation: With patient/family Time For Goal Achievement: 08/04/15 Potential to Achieve Goals: Fair    Frequency Min 3X/week   Barriers to discharge        Co-evaluation               End of Session Equipment Utilized During Treatment: Gait belt Activity Tolerance: Patient limited by fatigue Patient left: in chair;with family/visitor present;with call bell/phone within reach Nurse Communication: Mobility status    Functional Assessment Tool Used: Clinical Judgement Functional Limitation: Mobility: Walking and moving around Mobility: Walking and Moving Around Current Status [phone]): At least 40 percent but less than 60 percent impaired, limited or restricted Mobility: Walking and Moving Around Goal  Status [phone]): At least 20 percent but less than 40 percent impaired, limited or restricted    Time: 0854-0920 PT Time Calculation (min) (ACUTE ONLY): 26 min   Charges:   PT Evaluation $PT Eval High Complexity: 1 Procedure PT Treatments $Therapeutic Activity: 8-22 mins   PT G Codes:   PT G-Codes **NOT FOR INPATIENT CLASS** Functional Assessment Tool Used: Clinical Judgement Functional Limitation: Mobility: Walking and moving around Mobility: Walking and Moving Around Current Status [phone]): At least 40 percent but less than 60 percent impaired, limited or restricted Mobility: Walking and Moving Around Goal Status [phone]): At least 20 percent but less than 40 percent impaired, limited or restricted    Eustaquio Maize Domnic Vantol, PT, DPT X: [id number]   07/21/2015, 11:07 AM

## 2015-07-22 LAB — TYPE AND SCREEN
ABO/RH(D): B POS
Antibody Screen: NEGATIVE
UNIT DIVISION: 0
UNIT DIVISION: 0
UNIT DIVISION: 0
Unit division: 0
Unit division: 0
Unit division: 0

## 2015-07-22 LAB — PROTIME-INR
INR: 1.32 (ref 0.00–1.49)
PROTHROMBIN TIME: 16.5 s — AB (ref 11.6–15.2)

## 2015-07-22 LAB — RENAL FUNCTION PANEL
ALBUMIN: 2.9 g/dL — AB (ref 3.5–5.0)
Anion gap: 12 (ref 5–15)
BUN: 41 mg/dL — AB (ref 6–20)
CO2: 26 mmol/L (ref 22–32)
CREATININE: 7.65 mg/dL — AB (ref 0.61–1.24)
Calcium: 8.4 mg/dL — ABNORMAL LOW (ref 8.9–10.3)
Chloride: 101 mmol/L (ref 101–111)
GFR, EST AFRICAN AMERICAN: 7 mL/min — AB (ref >60)
GFR, EST NON AFRICAN AMERICAN: 6 mL/min — AB (ref >60)
Glucose, Bld: 121 mg/dL — ABNORMAL HIGH (ref 65–99)
PHOSPHORUS: 4.2 mg/dL (ref 2.5–4.6)
POTASSIUM: 3.8 mmol/L (ref 3.5–5.1)
Sodium: 139 mmol/L (ref 135–145)

## 2015-07-22 LAB — CBC
HEMATOCRIT: 20.5 % — AB (ref 39.0–52.0)
Hemoglobin: 6.8 g/dL — CL (ref 13.0–17.0)
MCH: 33.2 pg (ref 26.0–34.0)
MCHC: 33.7 g/dL (ref 30.0–36.0)
MCV: 98.6 fL (ref 78.0–100.0)
PLATELETS: 129 10*3/uL — AB (ref 150–400)
RBC: 2.08 MIL/uL — ABNORMAL LOW (ref 4.22–5.81)
RDW: 16.9 % — AB (ref 11.5–15.5)
WBC: 10.4 10*3/uL (ref 4.0–10.5)

## 2015-07-22 LAB — PREPARE RBC (CROSSMATCH)

## 2015-07-22 MED ORDER — EPOETIN ALFA 10000 UNIT/ML IJ SOLN
INTRAMUSCULAR | Status: AC
  Administered 2015-07-22: 10000 [IU] via INTRAVENOUS
  Filled 2015-07-22: qty 1

## 2015-07-22 MED ORDER — SODIUM CHLORIDE 0.9 % IV SOLN
Freq: Once | INTRAVENOUS | Status: AC
  Administered 2015-07-22: 17:00:00 via INTRAVENOUS

## 2015-07-22 MED ORDER — SODIUM CHLORIDE 0.9 % IV SOLN: 100.0000 mL | INTRAVENOUS | Status: DC | PRN

## 2015-07-22 MED ORDER — HEPARIN SODIUM (PORCINE) 1000 UNIT/ML IJ SOLN
INTRAMUSCULAR | Status: AC
  Administered 2015-07-22: 6000 [IU]
  Filled 2015-07-22: qty 7

## 2015-07-22 NOTE — Care Management Note (Signed)
Case Management Note  Patient Details  Name: TOMMY EWING MRN: [id number] Date of Birth: 24-Sep-1945  Subjective/Objective:                  Pt admitted for TIA. Pt is from home, lives with wife and is ind with ADL's at baseline. Pt has PCP, transportation and no difficulty affording his medications. Pt has a walker at home that he uses with ambulation. PT has recommended HH PT and pt is agreeable. Pt has chosen [facility] from [facility] agency list.   Action/Plan: Romualdo Bolk, of [facility] has been made aware of referral and will obtain pt info from chart. Anticipate DC home in next 24 hrs.   Expected Discharge Date:    07/22/2015              Expected Discharge Plan:  [place]  In-House Referral:  NA  Discharge planning Services  CM Consult  Post Acute Care Choice:  Durable Medical Equipment, Home Health Choice offered to:  Patient  DME Arranged:    DME Agency:     HH Arranged:  RN, PT [place] Agency:  [facility]  Status of Service:  In process, will continue to follow  Medicare Important Message Given:    Date Medicare IM Given:    Medicare IM give by:    Date Additional Medicare IM Given:    Additional Medicare Important Message give by:     If discussed at [place] of Stay Meetings, dates discussed:    Additional Comments:  Sherald Barge, RN 07/22/2015, 7:54 AM

## 2015-07-22 NOTE — Procedures (Signed)
   HEMODIALYSIS TREATMENT NOTE:  4 hour heparin-free dialysis completed via right femoral tunneled HD catheter. Exit site unremarkable. Goal met: 2 liters removed without interruption in ultrafiltration. Type and cross drawn on HD, but PRBCs not available for transfusion while pt dialyzing. Vancomycin and Unasyn given IV. All blood returned. Report called to Sharyn Blitz, RN.  Rockwell Alexandria, RN, CDN

## 2015-07-22 NOTE — Care Management Important Message (Signed)
Important Message  Patient Details  Name: [name] MRN: [id number] Date of Birth: 1945-12-01   Medicare Important Message Given:  Yes    Alvie Heidelberg, RN 07/22/2015, 1:43 PM

## 2015-07-22 NOTE — Progress Notes (Signed)
Pharmacy Antibiotic Note  [name] is a 70 y.o. male admitted on 07/17/2015 with pneumonia.  Pharmacy has been consulted for unasyn and Vancomycin dosing.   Plan: Vancomycin 750mg  after each HD Cont Unasyn 3 gm IV q24 hours F/u cultures and clinical course  Height: 6\' 3"  (190.5 cm) Weight: 163 lb 5.8 oz (74.1 kg) IBW/kg (Calculated) : 84.5  Temp (24hrs), Avg:98.7 F (37.1 C), Min:98.1 F (36.7 C), Max:99.5 F (37.5 C)   Recent Labs Lab 07/18/15 0324  07/19/15 0437  07/19/15 1839 07/20/15 0005 07/20/15 0423 07/21/15 0448 07/22/15 0447  WBC 8.4  < > 19.8*  < > 17.9* 16.2* 15.2* 13.5* 10.4  CREATININE 6.86*  6.79*  --  5.85*  --   --   --  8.06* 5.67* 7.65*  < > = values in this interval not displayed.  Estimated Creatinine Clearance: 9.3 mL/min (by C-G formula based on Cr of 7.65).    No Known Allergies  Antimicrobials this admission: 4/29 unasyn >>  4/30 Vanc>>  Cultures: 5/1 Blood cultures: pending 4/29 MRSA PCR: negative  Thank you for allowing pharmacy to be a part of this patient's care.  Hart Robinsons, PharmD Clinical Pharmacist Pager:  [phone] 07/22/2015 9:37 AM

## 2015-07-22 NOTE — Progress Notes (Signed)
PT Cancellation Note  Patient Details Name: [name] MRN: [id number] DOB: 1945/04/15   Cancelled Treatment:    Reason Eval/Treat Not Completed: Patient not medically ready.  Pt with H&H of 6.8 and 20.5 noted today.  Will hold PT today, and check back tomorrow.    Beth Dionel Archey, PT, DPT X: [phone]  07/22/2015, 8:36 AM

## 2015-07-22 NOTE — Progress Notes (Signed)
PROGRESS NOTE    [name]  [id number] DOB: Nov 09, 1945 DOA: 07/17/2015 PCP: [name], MD  Outpatient Specialists:  Danie Binder, MD (Gastroenterology)  Serafina Mitchell, MD (Vascular Surgery)  Herminio Commons, MD (Cardiology)  Fran Lowes, MD (Nephrology)  Michael Boston, MD (General Surgery)   Brief Narrative:  58 yom with a hx of ESRD on HD s/p laparoscopic peritoneal diaslysis cath insertion and inguinal hernia repair on 4/27, CVA, CAD, AAA s/p stent graft repair, and vascular dementia presented with complaints of left facial droop and AMS. His potassium was found to be elevated and creatinine was 8.35. Hgb was 8.7 which dropped to 6.6 the day after admission. CT abd showed retroperitoneal hematoma. Pt was transfused 4units pRBC.    Assessment & Plan:   Principal Problem:   TIA (transient ischemic attack) Active Problems:   End-stage renal disease on hemodialysis (HCC)   Hyperkalemia   Elevated troponin I level   Retroperitoneal hemorrhage   Acute blood loss anemia   Hypotension   Aspiration pneumonia (HCC)   Thrombocytopenia (HCC)   Coagulopathy (HCC)   Fecal impaction (HCC)   Essential hypertension   Acute respiratory failure with hypoxia ([place])   1. Left facial droop. Resolved. Likely TIA, possibly contributed to by hyperkalemia. Hx of CVA and is tx with ASA which is being held due to bleed. Imaging was negative for acute change. ECHO showed EF 55-60%, not likely cardiac source.  2. Retroperitoneal hemorrhage. S/p laparoscopic peritoneal dialysis cath insertion 4/27 by Dr. Johney Maine. Hgb 8.7 on admission but dropped to 6.6. Hgb 6.8 today; will transfuse 2 more PRBC. He does not have any worsening abdominal pain or swelling and is hemodynamically stable. If hemoglobin remains unstable, will likely need repeat CT abd. Abd dressing and peritoneal cath care protocol will be deferred to outpatient setting. 3. Acute blood loss anemia, Hx of anemia of  chronic disease. Due to peritoneal hemorrhage. Anemia panel showed total iron of 37, ferritin 316, and B12 559. Hgb 6.8 today, will transfuse 2 unit of prbc for a total of 6 units during this hospitalization. ASA and lovenox discontinued. Continue to monitor for any additional transfusions. Hemodynamically he is stable. 4. Thrombocytopenia. Etiology likely secondary to consumption thrombocytopenia from #2 and/or chronic heparin. Plat count 129. Continue to monitor closely.   5. Hypertension. Currently stable on clonidine 6. Elevated troponin. Troponin 0.06 on admission. Likely secondary to demand ischemia from hypotension and acute blood loss. Pt denies any chest pain. ECHO showed EF 55-60%.  7. Aspiration PNA causing hypoxic respiratory failure. Dysphagia is likely given hx of stroke. WBC now wnl. Blood cx negative. Continue Unasyn, but discontinue Vanc. Continue nebs. 8. ESRD with hyperkalemia. Cr significantly elevated. Potassium improved. Nephrology following. Will continue on hemodialysis until peritoneal dialysis is established in the outpatient setting. 9. Rectal fecal impaction. Noted from CT abd and pelvis. Improved with soap suds enema. Continue miralax PRN.   DVT prophylaxis: SCDs Code Status: full Family Communication: discussed with patient and family at the bedside Disposition Plan: discharge when improved with home health PT.    Consultants:   Nephrology  Procedures:    Hemodialysis  2-D echocardiogram 07/18/2015- Study Conclusions - Left ventricle: The cavity size was normal. Systolic function was  normal. The estimated ejection fraction was in the range of 55%  to 60%. Wall motion was normal; there were no regional wall  motion abnormalities. Doppler parameters are consistent with  abnormal left ventricular relaxation (grade 1 diastolic  dysfunction). - Ventricular septum: Septal motion showed abnormal function and  dyssynergy. IVCD. - Aortic valve: Trileaflet;  mildly thickened, mildly calcified  leaflets. There was moderate regurgitation. - Pulmonary arteries: Systolic pressure was mildly increased. PA  peak pressure: 39 mm Hg (S). Impressions: - No cardiac source of emboli was indentified. Compared to the prior study, there has been no significant interval change.  Antimicrobials:   vancomycin: 07/19/15>>5/3  Unasyn 07/18/15>>   Subjective: No abdominal pain or shortness of breath  Objective: Filed Vitals:   07/21/15 1600 07/21/15 2017 07/21/15 2252 07/22/15 0725  BP: 124/90  134/63 113/64  Pulse: 93  84 77  Temp:   98.1 F (36.7 C) 98.4 F (36.9 C)  TempSrc:   Oral Oral  Resp: 14  20 20   Height:      Weight:      SpO2: 92% 84% 95% 100%   No intake or output data in the 24 hours ending 07/22/15 0750 Filed Weights   07/18/15 1345 07/20/15 1916 07/21/15 0500  Weight: 74.1 kg (163 lb 5.8 oz) 78.9 kg (173 lb 15.1 oz) 74.1 kg (163 lb 5.8 oz)    Examination:  General exam: Appears calm and comfortable  Respiratory system: Clear to auscultation. Respiratory effort normal. Cardiovascular system: S1 & S2 heard, RRR. No JVD, murmurs, rubs, gallops or clicks. No pedal edema. Gastrointestinal system: Abdomen has postoperative dressing in place, soft and nontender. No organomegaly or masses felt. Normal bowel sounds heard. Central nervous system: Alert and oriented. No focal neurological deficits. Extremities: Symmetric 5 x 5 power. Skin: venous stasis in LE bilaterally Psychiatry: Judgement and insight appear normal. Mood & affect appropriate.    Data Reviewed: I have personally reviewed following labs and imaging studies  CBC:  Recent Labs Lab 07/19/15 1839 07/20/15 0005 07/20/15 0423 07/21/15 0448 07/22/15 0447  WBC 17.9* 16.2* 15.2* 13.5* 10.4  HGB 7.8* 7.7* 7.6* 8.1* 6.8*  HCT 23.4* 22.5* 22.1* 24.5* 20.5*  MCV 96.3 95.3 96.1 96.8 98.6  PLT 117* 125* 130* 141* [id number]*   Basic Metabolic Panel:  Recent Labs Lab  07/18/15 0324 07/19/15 0437 07/20/15 0423 07/21/15 0448 07/22/15 0447  NA 137  137 137 139 138 139  K 5.0  5.0 4.3 4.0 3.5 3.8  CL 101  101 101 104 100* 101  CO2 21*  21* 24 23 26 26   GLUCOSE 142*  144* 107* 115* 97 121*  BUN 31*  32* 29* 41* 26* 41*  CREATININE 6.86*  6.79* 5.85* 8.06* 5.67* 7.65*  CALCIUM 8.6*  8.6* 8.4* 8.6* 8.5* 8.4*  PHOS 5.9*  --  3.9 3.3 4.2   GFR: Estimated Creatinine Clearance: 9.3 mL/min (by C-G formula based on Cr of 7.65). Liver Function Tests:  Recent Labs Lab 07/17/15 1145 07/18/15 0324 07/19/15 0437 07/20/15 0423 07/21/15 0448 07/22/15 0447  AST 27  --  39  --   --   --   ALT 11*  --  9*  --   --   --   ALKPHOS 67  --  59  --   --   --   BILITOT 0.4  --  1.0  --   --   --   PROT 7.0  --  6.4*  --   --   --   ALBUMIN 3.6 3.3* 3.3* 3.2* 3.2* 2.9*   No results for input(s): LIPASE, AMYLASE in the last 168 hours. No results for input(s): AMMONIA in the last 168 hours. Coagulation Profile:  Recent Labs Lab 07/18/15 1429 07/19/15 0437 07/20/15 0423 07/21/15 0448 07/22/15 0447  INR 1.50* 1.81* 1.45 1.25 1.32   Cardiac Enzymes:  Recent Labs Lab 07/17/15 1145 07/17/15 2205 07/18/15 0324 07/18/15 0928 07/18/15 1429  TROPONINI 0.06* 0.14* 0.18* 0.19* 0.18*   BNP (last 3 results) No results for input(s): PROBNP in the last 8760 hours. HbA1C: No results for input(s): HGBA1C in the last 72 hours. CBG:  Recent Labs Lab 07/17/15 1129  GLUCAP 83   Lipid Profile: No results for input(s): CHOL, HDL, LDLCALC, TRIG, CHOLHDL, LDLDIRECT in the last 72 hours. Thyroid Function Tests:  Recent Labs  07/20/15 0453  TSH 2.960   Anemia Panel: No results for input(s): VITAMINB12, FOLATE, FERRITIN, TIBC, IRON, RETICCTPCT in the last 72 hours. Urine analysis:    Component Value Date/Time   COLORURINE YELLOW 05/23/2014 2037   APPEARANCEUR CLEAR 05/23/2014 2037   LABSPEC 1.020 05/23/2014 2037   PHURINE 6.5 05/23/2014 2037    GLUCOSEU NEGATIVE 05/23/2014 2037   HGBUR TRACE* 05/23/2014 2037   [place] NEGATIVE 05/23/2014 2037   [place] NEGATIVE 05/23/2014 2037   PROTEINUR >300* 05/23/2014 2037   UROBILINOGEN 0.2 05/23/2014 2037   NITRITE NEGATIVE 05/23/2014 2037   LEUKOCYTESUR NEGATIVE 05/23/2014 2037   Sepsis Labs: @LABRCNTIP (procalcitonin:4,lacticidven:4)  ) Recent Results (from the past 240 hour(s))  MRSA PCR Screening     Status: None   Collection Time: 07/18/15 12:48 PM  Result Value Ref Range Status   MRSA by PCR NEGATIVE NEGATIVE Final    Comment:        The GeneXpert MRSA Assay (FDA approved for NASAL specimens only), is one component of a comprehensive MRSA colonization surveillance program. It is not intended to diagnose MRSA infection nor to guide or monitor treatment for MRSA infections.   Culture, blood (Routine X 2) w Reflex to ID Panel     Status: None (Preliminary result)   Collection Time: 07/20/15  7:30 AM  Result Value Ref Range Status   Specimen Description BLOOD  Final   Special Requests NONE  Final   Culture NO GROWTH 1 DAY  Final   Report Status PENDING  Incomplete  Culture, blood (Routine X 2) w Reflex to ID Panel     Status: None (Preliminary result)   Collection Time: 07/20/15  7:36 AM  Result Value Ref Range Status   Specimen Description BLOOD  Final   Special Requests NONE  Final   Culture NO GROWTH 1 DAY  Final   Report Status PENDING  Incomplete    Radiology Studies: No results found.  Scheduled Meds: .  stroke: mapping our early stages of recovery book   Does not apply Once  . sodium chloride   Intravenous Once  . sodium chloride   Intravenous Once  . albuterol  2.5 mg Nebulization TID  . ampicillin-sulbactam (UNASYN) IV  3 g Intravenous Q24H  . antiseptic oral rinse  7 mL Mouth Rinse BID  . atorvastatin  80 mg Oral q1800  . citalopram  10 mg Oral Daily  . cloNIDine  0.2 mg Oral BID  . epoetin (EPOGEN/PROCRIT) injection  10,000 Units Intravenous Q  M,W,F-HD  . ezetimibe  10 mg Oral QHS  . feeding supplement (NEPRO CARB STEADY)  237 mL Oral BID BM  . pantoprazole  40 mg Oral Daily  . vancomycin  750 mg Intravenous Q M,W,F-HD  . white petrolatum   Topical BID   Continuous Infusions:    LOS: 4 days   Time  spent: 25 minutes  Kathie Dike, MD [facility] Pager [phone]  If 7PM-7AM, please contact night-coverage [email or web address] Password [email or web address] 07/22/2015, 7:50 AM

## 2015-07-22 NOTE — Progress Notes (Signed)
SLP Cancellation Note  Patient Details Name: [name] MRN: [id number] DOB: 01-19-1946   Cancelled treatment:       Reason Eval/Treat Not Completed: Patient at procedure or test/unavailable; Pt not seen this date- at dialysis. Will check back as schedule permits.  Thank you,  Genene Churn, [place]    [place] 07/22/2015, 2:28 PM

## 2015-07-22 NOTE — Progress Notes (Signed)
MAYLON HABERLE  MRN: [id number]  DOB/AGE: 71-Jun-1947 71 y.o.  Primary Care Physician:Scott Wolfgang Phoenix, MD  Admit date: 07/17/2015  Chief Complaint:  Chief Complaint  Patient presents with  . Altered Mental Status    S-Pt presented on  07/17/2015 with  Chief Complaint  Patient presents with  . Altered Mental Status  .    Pt offers no new complaints  the patient wife is present in the room as well, also voices no new concerns.   Meds  .  stroke: mapping our early stages of recovery book   Does not apply Once  . sodium chloride   Intravenous Once  . sodium chloride   Intravenous Once  . albuterol  2.5 mg Nebulization TID  . ampicillin-sulbactam (UNASYN) IV  3 g Intravenous Q24H  . antiseptic oral rinse  7 mL Mouth Rinse BID  . atorvastatin  80 mg Oral q1800  . citalopram  10 mg Oral Daily  . cloNIDine  0.2 mg Oral BID  . epoetin (EPOGEN/PROCRIT) injection  10,000 Units Intravenous Q M,W,F-HD  . ezetimibe  10 mg Oral QHS  . feeding supplement (NEPRO CARB STEADY)  237 mL Oral BID BM  . pantoprazole  40 mg Oral Daily  . vancomycin  750 mg Intravenous Q M,W,F-HD  . white petrolatum   Topical BID      Physical Exam: Vital signs in last 24 hours: Temp:  [98.1 F (36.7 C)-99.5 F (37.5 C)] 98.4 F (36.9 C) (05/03 0725) Pulse Rate:  [77-103] 77 (05/03 0725) Resp:  [14-21] 20 (05/03 0725) BP: (113-167)/(63-110) 113/64 mmHg (05/03 0725) SpO2:  [84 %-100 %] 98 % (05/03 0747) Weight change:  Last BM Date: 07/21/15  Intake/Output from previous day:       Physical Exam: General- pt is awake,alert.orientation much better. Resp- No acute REsp distress, CTA B/L NO Rhonchi CVS- S1S2 regular in rate and rhythm GIT- BS+, soft, ND, PD cath in situ EXT- NO LE Edema, Cyanosis Access- Right thigh PC  Lab Results: CBC  Recent Labs  07/21/15 0448 07/22/15 0447  WBC 13.5* 10.4  HGB 8.1* 6.8*  HCT 24.5* 20.5*  PLT 141* 129*    BMET  Recent Labs  07/21/15 0448  07/22/15 0447  NA 138 139  K 3.5 3.8  CL 100* 101  CO2 26 26  GLUCOSE 97 121*  BUN 26* 41*  CREATININE 5.67* 7.65*  CALCIUM 8.5* 8.4*    MICRO Recent Results (from the past 240 hour(s))  MRSA PCR Screening     Status: None   Collection Time: 07/18/15 12:48 PM  Result Value Ref Range Status   MRSA by PCR NEGATIVE NEGATIVE Final    Comment:        The GeneXpert MRSA Assay (FDA approved for NASAL specimens only), is one component of a comprehensive MRSA colonization surveillance program. It is not intended to diagnose MRSA infection nor to guide or monitor treatment for MRSA infections.   Culture, blood (Routine X 2) w Reflex to ID Panel     Status: None (Preliminary result)   Collection Time: 07/20/15  7:30 AM  Result Value Ref Range Status   Specimen Description BLOOD  Final   Special Requests NONE  Final   Culture NO GROWTH 1 DAY  Final   Report Status PENDING  Incomplete  Culture, blood (Routine X 2) w Reflex to ID Panel     Status: None (Preliminary result)   Collection Time: 07/20/15  7:36 AM  Result  Value Ref Range Status   Specimen Description BLOOD  Final   Special Requests NONE  Final   Culture NO GROWTH 1 DAY  Final   Report Status PENDING  Incomplete      Lab Results  Component Value Date   CALCIUM 8.4* 07/22/2015   CAION 1.08* 04/07/2015   PHOS 4.2 07/22/2015        Impression: 1)Renal  ESRD on HD                Pt is on Monday/Wednesday/Friday schedule                Will dialyze  today  2)HTN  Medication- On Calcium Channel Blockers    3)Anemia HGb not at goal (9--11)   In ESRD the goal for Hgb is 9--11   Anemia sec to  Retro-peritoneal hematoma      Recived PRBC     On epo   4)CKD Mineral-Bone Disorder  Phosphorus now at goal. Calcium is at goal.( after correcting for low albumin)  5)CNS- admitted with CVA/TIA Primary MD following  6)Electrolytes Normokalemic NOrmonatremic    7)Acid base Co2 at  goal     Plan:  Will dialyze today Will use 3 k bath Will keep on epo     BHUTANI,MANPREET S 07/22/2015, 9:15 AM

## 2015-07-23 ENCOUNTER — Inpatient Hospital Stay (HOSPITAL_COMMUNITY): Payer: Medicare Other

## 2015-07-23 LAB — CBC
HEMATOCRIT: 27.5 % — AB (ref 39.0–52.0)
Hemoglobin: 9.3 g/dL — ABNORMAL LOW (ref 13.0–17.0)
MCH: 32.2 pg (ref 26.0–34.0)
MCHC: 33.8 g/dL (ref 30.0–36.0)
MCV: 95.2 fL (ref 78.0–100.0)
Platelets: 137 10*3/uL — ABNORMAL LOW (ref 150–400)
RBC: 2.89 MIL/uL — ABNORMAL LOW (ref 4.22–5.81)
RDW: 17.8 % — AB (ref 11.5–15.5)
WBC: 11 10*3/uL — ABNORMAL HIGH (ref 4.0–10.5)

## 2015-07-23 LAB — TYPE AND SCREEN
ABO/RH(D): B POS
Antibody Screen: NEGATIVE
UNIT DIVISION: 0
Unit division: 0

## 2015-07-23 LAB — RENAL FUNCTION PANEL
ALBUMIN: 3.1 g/dL — AB (ref 3.5–5.0)
Anion gap: 13 (ref 5–15)
BUN: 26 mg/dL — AB (ref 6–20)
CHLORIDE: 99 mmol/L — AB (ref 101–111)
CO2: 26 mmol/L (ref 22–32)
CREATININE: 5.32 mg/dL — AB (ref 0.61–1.24)
Calcium: 8.4 mg/dL — ABNORMAL LOW (ref 8.9–10.3)
GFR calc Af Amer: 11 mL/min — ABNORMAL LOW (ref >60)
GFR calc non Af Amer: 10 mL/min — ABNORMAL LOW (ref >60)
GLUCOSE: 101 mg/dL — AB (ref 65–99)
POTASSIUM: 3.7 mmol/L (ref 3.5–5.1)
Phosphorus: 3.8 mg/dL (ref 2.5–4.6)
Sodium: 138 mmol/L (ref 135–145)

## 2015-07-23 NOTE — Progress Notes (Signed)
Subjective: Interval History: Patient offers no complaint. Appetite remains poor but improving. No abdominal pain  Objective: Vital signs in last 24 hours: Temp:  [98.2 F (36.8 C)-100.3 F (37.9 C)] 99.4 F (37.4 C) (05/04 0519) Pulse Rate:  [69-112] 112 (05/04 0519) Resp:  [16-20] 18 (05/04 0519) BP: (93-142)/(52-78) 142/66 mmHg (05/04 0519) SpO2:  [94 %-100 %] 100 % (05/04 0519) Weight change:   Intake/Output from previous day: 05/03 0701 - 05/04 0700 In: 1115 [P.O.:240; I.V.:260; Blood:365; IV Piggyback:250] Out: 2100 [Urine:100] Intake/Output this shift:    General appearance: alert, cooperative and no distress Resp: clear to auscultation bilaterally Cardio: regular rate and rhythm, S1, S2 normal, no murmur, click, rub or gallop GI: Patient is still with dressing. His positive bowel sound. Extremities: extremities normal, atraumatic, no cyanosis or edema  Lab Results:  Recent Labs  07/22/15 0447 07/23/15 0523  WBC 10.4 11.0*  HGB 6.8* 9.3*  HCT 20.5* 27.5*  PLT 129* 137*   BMET:   Recent Labs  07/22/15 0447 07/23/15 0523  NA 139 138  K 3.8 3.7  CL 101 99*  CO2 26 26  GLUCOSE 121* 101*  BUN 41* 26*  CREATININE 7.65* 5.32*  CALCIUM 8.4* 8.4*   No results for input(s): PTH in the last 72 hours. Iron Studies: No results for input(s): IRON, TIBC, TRANSFERRIN, FERRITIN in the last 72 hours.  Studies/Results: No results found.  I have reviewed the patient's current medications.  Assessment/Plan: Problem #1 History of TIA . Patient seems better Problem #2 history of fever with leukocytosis. Blood culture no growth. Presently he is on antibiotics. His white blood cell count is high but improving. Source of infection could be from aspiration pneumonia. Problem #3 anemia: His hemoglobin declined again yesterday and received 2 units of PRBC . Etiology not clear. Initially taught to be secondary to bleeding post surgery. Received blood transfusion and his  hemoglobin improved to decline again yesterday Problem #4: Hypertension: His blood pressure is reasonably contolled Problem #5 . Retroperitoeal hematoma.Patient had Peritoneal  dialysis catheter placed and also right hernia repair. Problem #6 metabolic bone disease: His calcium and phosphorus is in range. Patient is not on a binder. Problem #7 history of COPD Problem #8 history of abdominal aortic aneurysm. Plan: 1] We'll make arrangements for patient to get dialysis tomorrow 2] we'll continue his antibiotics 3] we'll check CBC and renal panel in am 4] Possibly repeat CAT of the abdomen to r/o reccurrens of his bleeding ?   LOS: 5 days   Devine Klingel S 07/23/2015,7:43 AM

## 2015-07-23 NOTE — Progress Notes (Signed)
Notified mid-level of patient increased temperature after first unit of blood and informed them that patient was to receive second unit of blood.  Received order to premedicate patient with tylenol and then give second unit.  Premedicated patient with tylenol, temperature decreased and second unit of blood was infused without any issues.

## 2015-07-23 NOTE — Progress Notes (Signed)
PROGRESS NOTE    [name]  [id number] DOB: Nov 29, 1945 DOA: 07/17/2015 PCP: [name], MD  Outpatient Specialists:  Danie Binder, MD (Gastroenterology)  Serafina Mitchell, MD (Vascular Surgery)  Herminio Commons, MD (Cardiology)  Fran Lowes, MD (Nephrology)  Michael Boston, MD (General Surgery)   Brief Narrative:  50 yom with a hx of ESRD on HD s/p laparoscopic peritoneal diaslysis cath insertion and inguinal hernia repair on 4/27, CVA, CAD, AAA s/p stent graft repair, and vascular dementia presented with complaints of left facial droop and AMS. His potassium was found to be elevated and creatinine was 8.35. Hgb was 8.7 which dropped to 6.6 the day after admission. CT abd showed retroperitoneal hematoma. Pt was transfused 4units pRBC.    Assessment & Plan:   Principal Problem:   TIA (transient ischemic attack) Active Problems:   End-stage renal disease on hemodialysis (HCC)   Hyperkalemia   Elevated troponin I level   Retroperitoneal hemorrhage   Acute blood loss anemia   Hypotension   Aspiration pneumonia (HCC)   Thrombocytopenia (HCC)   Coagulopathy (HCC)   Fecal impaction (HCC)   Essential hypertension   Acute respiratory failure with hypoxia ([place])   1. Left facial droop. Resolved. Likely TIA, possibly contributed to by hyperkalemia. Hx of CVA and is tx with ASA which is being held due to bleed. Imaging was negative for acute change. ECHO showed EF 55-60%, not likely cardiac source. Restart aspirin when able. 2. Retroperitoneal hemorrhage. S/p laparoscopic peritoneal dialysis cath insertion 4/27 by Dr. Johney Maine. He does not have any worsening abdominal pain or swelling and is hemodynamically stable s/p 2U pRBC transfusion on 5/03 for a total of 6 units to date. Repeat CT abdomen showed improvement of hematoma. Abd dressing and peritoneal cath care protocol will be deferred to outpatient setting. 3. Acute blood loss anemia, Hx of anemia of chronic  disease. Due to retroperitoneal hemorrhage. Anemia panel showed total iron of 37, ferritin 316, and B12 559. Hgb 9.3 today after having a total of 6 units pRBCs transfused during this hospitalization. Hemodynamically he is stable, but will continue to monitor. 4. Thrombocytopenia. Etiology likely secondary to consumption thrombocytopenia from #2 and/or chronic heparin. Plat count 137. Continue to monitor closely.   5. Hypertension. Currently stable on clonidine 6. Elevated troponin. Troponin 0.06 on admission. Likely secondary to demand ischemia from hypotension and acute blood loss. Pt denies any chest pain. ECHO showed EF 55-60%.  7. Aspiration PNA causing hypoxic respiratory failure. Dysphagia is likely given hx of stroke. WBC mildly elevated. Blood cx negative. Continue Unasyn for 1 more day. Continue nebs. 8. ESRD.  Nephrology following. Will continue on hemodialysis until peritoneal dialysis is established in the outpatient setting. 9. Ileus vs. Psbo. Found on CT abdomen. Clinically, he is not vomiting, is moving his bowels and appears to be tolerating some diet. Will discuss with general surgery.   DVT prophylaxis: SCDs Code Status: full Family Communication: discussed with patient and family at the bedside Disposition Plan: discharge when improved with home health PT.    Consultants:   Nephrology  Procedures:   Hemodialysis 5/01  HD 5/03  2-D echocardiogram 07/18/2015- Study Conclusions - Left ventricle: The cavity size was normal. Systolic function was  normal. The estimated ejection fraction was in the range of 55%  to 60%. Wall motion was normal; there were no regional wall  motion abnormalities. Doppler parameters are consistent with  abnormal left ventricular relaxation (grade 1 diastolic  dysfunction). -  Ventricular septum: Septal motion showed abnormal function and  dyssynergy. IVCD. - Aortic valve: Trileaflet; mildly thickened, mildly calcified  leaflets.  There was moderate regurgitation. - Pulmonary arteries: Systolic pressure was mildly increased. PA  peak pressure: 39 mm Hg (S). Impressions: - No cardiac source of emboli was indentified. Compared to the prior study, there has been no significant interval change.  Antimicrobials:   vancomycin: 07/19/15>>5/3  Unasyn 07/18/15>>   Subjective: No new complaints. No vomiting. Had 2 large BM yesterday. Says that he is passing flatus. Tolerating small amounts of food.  Objective: Filed Vitals:   07/22/15 2350 07/23/15 0015 07/23/15 0305 07/23/15 0519  BP: 93/52 102/63 103/68 142/66  Pulse: 92  79 112  Temp: 98.9 F (37.2 C) 99.3 F (37.4 C) 99.8 F (37.7 C) 99.4 F (37.4 C)  TempSrc: Oral Oral Oral Oral  Resp: 20 16 16 18   Height:      Weight:      SpO2:    100%    Intake/Output Summary (Last 24 hours) at 07/23/15 0717 Last data filed at 07/23/15 0000  Gross per 24 hour  Intake   1115 ml  Output   2100 ml  Net   -985 ml   Filed Weights   07/18/15 1345 07/20/15 1916 07/21/15 0500  Weight: 74.1 kg (163 lb 5.8 oz) 78.9 kg (173 lb 15.1 oz) 74.1 kg (163 lb 5.8 oz)    Examination:  General exam: Appears calm and comfortable  Respiratory system: diminished breath sounds at bases. Respiratory effort normal. Cardiovascular system: S1 & S2 heard, RRR. No JVD, murmurs, rubs, gallops or clicks. No pedal edema. Gastrointestinal system: Abdomen is nondistended, soft and nontender. No organomegaly or masses felt. Normal bowel sounds heard. Central nervous system: Alert and oriented. No focal neurological deficits. Extremities: Symmetric 5 x 5 power. Skin: No rashes, lesions or ulcers Psychiatry: Judgement and insight appear normal. Mood & affect appropriate.    Data Reviewed: I have personally reviewed following labs and imaging studies  CBC:  Recent Labs Lab 07/20/15 0005 07/20/15 0423 07/21/15 0448 07/22/15 0447 07/23/15 0523  WBC 16.2* 15.2* 13.5* 10.4 11.0*  HGB  7.7* 7.6* 8.1* 6.8* 9.3*  HCT 22.5* 22.1* 24.5* 20.5* 27.5*  MCV 95.3 96.1 96.8 98.6 95.2  PLT 125* 130* 141* 129* 0000000*   Basic Metabolic Panel:  Recent Labs Lab 07/18/15 0324 07/19/15 0437 07/20/15 0423 07/21/15 0448 07/22/15 0447 07/23/15 0523  NA 137  137 137 139 138 139 138  K 5.0  5.0 4.3 4.0 3.5 3.8 3.7  CL 101  101 101 104 100* 101 99*  CO2 21*  21* 24 23 26 26 26   GLUCOSE 142*  144* 107* 115* 97 121* 101*  BUN 31*  32* 29* 41* 26* 41* 26*  CREATININE 6.86*  6.79* 5.85* 8.06* 5.67* 7.65* 5.32*  CALCIUM 8.6*  8.6* 8.4* 8.6* 8.5* 8.4* 8.4*  PHOS 5.9*  --  3.9 3.3 4.2 3.8   GFR: Estimated Creatinine Clearance: 13.3 mL/min (by C-G formula based on Cr of 5.32). Liver Function Tests:  Recent Labs Lab 07/17/15 1145  07/19/15 0437 07/20/15 0423 07/21/15 0448 07/22/15 0447 07/23/15 0523  AST 27  --  39  --   --   --   --   ALT 11*  --  9*  --   --   --   --   ALKPHOS 67  --  59  --   --   --   --  BILITOT 0.4  --  1.0  --   --   --   --   PROT 7.0  --  6.4*  --   --   --   --   ALBUMIN 3.6  < > 3.3* 3.2* 3.2* 2.9* 3.1*  < > = values in this interval not displayed. No results for input(s): LIPASE, AMYLASE in the last 168 hours. No results for input(s): AMMONIA in the last 168 hours. Coagulation Profile:  Recent Labs Lab 07/18/15 1429 07/19/15 0437 07/20/15 0423 07/21/15 0448 07/22/15 0447  INR 1.50* 1.81* 1.45 1.25 1.32   Cardiac Enzymes:  Recent Labs Lab 07/17/15 1145 07/17/15 2205 07/18/15 0324 07/18/15 0928 07/18/15 1429  TROPONINI 0.06* 0.14* 0.18* 0.19* 0.18*   BNP (last 3 results) No results for input(s): PROBNP in the last 8760 hours. HbA1C: No results for input(s): HGBA1C in the last 72 hours. CBG:  Recent Labs Lab 07/17/15 1129  GLUCAP 83   Lipid Profile: No results for input(s): CHOL, HDL, LDLCALC, TRIG, CHOLHDL, LDLDIRECT in the last 72 hours. Thyroid Function Tests: No results for input(s): TSH, T4TOTAL, FREET4,  T3FREE, THYROIDAB in the last 72 hours. Anemia Panel: No results for input(s): VITAMINB12, FOLATE, FERRITIN, TIBC, IRON, RETICCTPCT in the last 72 hours. Urine analysis:    Component Value Date/Time   COLORURINE YELLOW 05/23/2014 2037   APPEARANCEUR CLEAR 05/23/2014 2037   LABSPEC 1.020 05/23/2014 2037   PHURINE 6.5 05/23/2014 2037   GLUCOSEU NEGATIVE 05/23/2014 2037   HGBUR TRACE* 05/23/2014 2037   [place] NEGATIVE 05/23/2014 2037   [place] NEGATIVE 05/23/2014 2037   PROTEINUR >300* 05/23/2014 2037   UROBILINOGEN 0.2 05/23/2014 2037   NITRITE NEGATIVE 05/23/2014 2037   LEUKOCYTESUR NEGATIVE 05/23/2014 2037   Sepsis Labs: @LABRCNTIP (procalcitonin:4,lacticidven:4)  ) Recent Results (from the past 240 hour(s))  MRSA PCR Screening     Status: None   Collection Time: 07/18/15 12:48 PM  Result Value Ref Range Status   MRSA by PCR NEGATIVE NEGATIVE Final    Comment:        The GeneXpert MRSA Assay (FDA approved for NASAL specimens only), is one component of a comprehensive MRSA colonization surveillance program. It is not intended to diagnose MRSA infection nor to guide or monitor treatment for MRSA infections.   Culture, blood (Routine X 2) w Reflex to ID Panel     Status: None (Preliminary result)   Collection Time: 07/20/15  7:30 AM  Result Value Ref Range Status   Specimen Description BLOOD RIGHT FOREARM  Final   Special Requests BOTTLES DRAWN AEROBIC AND ANAEROBIC 6CC  Final   Culture NO GROWTH 2 DAYS  Final   Report Status PENDING  Incomplete  Culture, blood (Routine X 2) w Reflex to ID Panel     Status: None (Preliminary result)   Collection Time: 07/20/15  7:36 AM  Result Value Ref Range Status   Specimen Description BLOOD RIGHT HAND  Final   Special Requests BOTTLES DRAWN AEROBIC AND ANAEROBIC 6CC  Final   Culture NO GROWTH 2 DAYS  Final   Report Status PENDING  Incomplete    Radiology Studies: No results found.  Scheduled Meds: . sodium chloride    Intravenous Once  . sodium chloride   Intravenous Once  . albuterol  2.5 mg Nebulization TID  . ampicillin-sulbactam (UNASYN) IV  3 g Intravenous Q24H  . antiseptic oral rinse  7 mL Mouth Rinse BID  . atorvastatin  80 mg Oral q1800  . citalopram  10 mg Oral Daily  . cloNIDine  0.2 mg Oral BID  . epoetin (EPOGEN/PROCRIT) injection  10,000 Units Intravenous Q M,W,F-HD  . ezetimibe  10 mg Oral QHS  . feeding supplement (NEPRO CARB STEADY)  237 mL Oral BID BM  . pantoprazole  40 mg Oral Daily  . white petrolatum   Topical BID   Continuous Infusions:    LOS: 5 days   Time spent: 25 minutes  Kathie Dike, MD [facility] Pager [phone]  If 7PM-7AM, please contact night-coverage [email or web address] Password [email or web address] 07/23/2015, 7:17 AM

## 2015-07-24 LAB — CBC
HEMATOCRIT: 25 % — AB (ref 39.0–52.0)
HEMOGLOBIN: 8.2 g/dL — AB (ref 13.0–17.0)
MCH: 31.1 pg (ref 26.0–34.0)
MCHC: 32.8 g/dL (ref 30.0–36.0)
MCV: 94.7 fL (ref 78.0–100.0)
Platelets: 154 10*3/uL (ref 150–400)
RBC: 2.64 MIL/uL — ABNORMAL LOW (ref 4.22–5.81)
RDW: 18.3 % — ABNORMAL HIGH (ref 11.5–15.5)
WBC: 9 10*3/uL (ref 4.0–10.5)

## 2015-07-24 LAB — BASIC METABOLIC PANEL
ANION GAP: 11 (ref 5–15)
BUN: 41 mg/dL — ABNORMAL HIGH (ref 6–20)
CALCIUM: 8.2 mg/dL — AB (ref 8.9–10.3)
CHLORIDE: 99 mmol/L — AB (ref 101–111)
CO2: 25 mmol/L (ref 22–32)
CREATININE: 7.6 mg/dL — AB (ref 0.61–1.24)
GFR, EST AFRICAN AMERICAN: 7 mL/min — AB (ref >60)
GFR, EST NON AFRICAN AMERICAN: 6 mL/min — AB (ref >60)
GLUCOSE: 93 mg/dL (ref 65–99)
Potassium: 3.6 mmol/L (ref 3.5–5.1)
Sodium: 135 mmol/L (ref 135–145)

## 2015-07-24 MED ORDER — POLYETHYLENE GLYCOL 3350 17 G PO PACK
17.0000 g | PACK | Freq: Two times a day (BID) | ORAL | Status: DC
  Administered 2015-07-24 – 2015-07-25 (×2): 17 g via ORAL
  Filled 2015-07-24 (×2): qty 1

## 2015-07-24 MED ORDER — LIDOCAINE HCL (PF) 1 % IJ SOLN
INTRAMUSCULAR | Status: AC
  Filled 2015-07-24: qty 5

## 2015-07-24 MED ORDER — HEPARIN SODIUM (PORCINE) 1000 UNIT/ML IJ SOLN
INTRAMUSCULAR | Status: AC
  Administered 2015-07-24: 6000 [IU]
  Filled 2015-07-24: qty 7

## 2015-07-24 MED ORDER — MILK AND MOLASSES ENEMA: 1.0000 | Freq: Once | RECTAL | Status: DC

## 2015-07-24 MED ORDER — EPOETIN ALFA 10000 UNIT/ML IJ SOLN
INTRAMUSCULAR | Status: AC
  Administered 2015-07-24: 10000 [IU] via INTRAVENOUS
  Filled 2015-07-24: qty 1

## 2015-07-24 NOTE — Progress Notes (Signed)
Physical Therapy Treatment Patient Details Name: RAINE ALWARDT MRN: [id number] DOB: 05-23-1945 Today's Date: 07/24/2015    History of Present Illness AMAREY KOK is a 70 y.o. 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 The patient recently underwent peritoneal dialysis placement by general surgery yesterday. The patient underwent the procedure without any difficulty. The atrial catheter was placed due to end-stage renal disease with underlying venous access. This morning the patient awoke and was noticed to have a left sided facial droop by his wife. Patient was unable to verbalize very well during that time.. When she exited the room and returned, the patient was lying flat on his back and appeared to be diaphoretic and not responding very well. His wife called EMS who brought the patient in his construct. The patient began to recall things after EMS arrived. Patient shows no deficits since being in the emergency department.  Since admission pt has had low H&H, however today it is 8.1/24.5.  Pt is also suspected to have aspiration PNA.  CT of abdomen/pelvis revealed retroperitoneal hematoma.     PT Comments    Pt received in bed, and was agreeable to PT tx.  Pt was able to transfer with less assistance today from the bed<>chair, however still requires Min A and RW.  Gait deferred due to fatigue, and pt getting ready to eat lunch.  Will continue to progress as pt is able.  Continue to recommend HHPT.    Follow Up Recommendations  Home health PT     Equipment Recommendations  None recommended by PT    Recommendations for Other Services       Precautions / Restrictions Precautions Precautions: Fall Restrictions Weight Bearing Restrictions: No    Mobility  Bed  Mobility Overal bed mobility: Needs Assistance Bed Mobility: Supine to Sit     Supine to sit: Supervision        Transfers Overall transfer level: Needs assistance Equipment used: Rolling walker (2 wheeled) Transfers: Sit to/from Stand;Stand Pivot Transfers Sit to Stand: Min assist;From elevated surface Stand pivot transfers: Min guard       General transfer comment: Pt demonstrates improved level of assistance required for sit<>stand<>chair transfers today  Ambulation/Gait Ambulation/Gait assistance:  (Gait deferred due to pt expressing that he felt "funny." )               Stairs            Wheelchair Mobility    Modified Rankin (Stroke Patients Only)       Balance   Sitting-balance support: Bilateral upper extremity supported       Standing balance support: Bilateral upper extremity supported Standing balance-Leahy Scale: Fair                      Cognition Arousal/Alertness: Awake/alert Behavior During Therapy: Flat affect Overall Cognitive Status: Within Functional Limits for tasks assessed                      Exercises      General Comments        Pertinent Vitals/Pain Pain Assessment: No/denies pain    Home Living  Prior Function            PT Goals (current goals can now be found in the care plan section) Acute Rehab PT Goals Patient Stated Goal: Pt wants to go home.  PT Goal Formulation: With patient/family Time For Goal Achievement: 08/04/15 Potential to Achieve Goals: Fair Progress towards PT goals: Progressing toward goals    Frequency       PT Plan Current plan remains appropriate    Co-evaluation             End of Session Equipment Utilized During Treatment: Gait belt Activity Tolerance: Patient limited by fatigue Patient left: in chair;with call bell/phone within reach     Time: 1146-1200 PT Time Calculation (min) (ACUTE ONLY): 14 min  Charges:   $Therapeutic Activity: 8-22 mins                    G Codes:      Beth Irmgard Rampersaud, PT, DPT X: [id number]   07/24/2015, 12:33 PM

## 2015-07-24 NOTE — Progress Notes (Signed)
PROGRESS NOTE    [name]  [id number] DOB: 10-20-1945 DOA: 07/17/2015 PCP: [name], MD  Outpatient Specialists:  Danie Binder, MD (Gastroenterology)  Serafina Mitchell, MD (Vascular Surgery)  Herminio Commons, MD (Cardiology)  Fran Lowes, MD (Nephrology)  Michael Boston, MD (General Surgery)   Brief Narrative:  45 yom with a hx of ESRD on HD s/p laparoscopic peritoneal diaslysis cath insertion and inguinal hernia repair on 4/27, CVA, CAD, AAA s/p stent graft repair, and vascular dementia presented with complaints of left facial droop and AMS. His potassium was found to be elevated and creatinine was 8.35. Hgb was 8.7 which dropped to 6.6 the day after admission. CT abd showed retroperitoneal hematoma. Pt has been transfused 6 units pRBCS total.    Assessment & Plan:   Principal Problem:   TIA (transient ischemic attack) Active Problems:   End-stage renal disease on hemodialysis (HCC)   Hyperkalemia   Elevated troponin I level   Retroperitoneal hemorrhage   Acute blood loss anemia   Hypotension   Aspiration pneumonia (HCC)   Thrombocytopenia (HCC)   Coagulopathy (HCC)   Fecal impaction (HCC)   Essential hypertension   Acute respiratory failure with hypoxia ([place])   1. Left facial droop. Resolved. Likely TIA, possibly contributed to by hyperkalemia. Hx of CVA and is tx with ASA which is being held due to bleed. Imaging was negative for acute change. ECHO showed EF 55-60%, not likely cardiac source. Restart aspirin when able. 2. Retroperitoneal hemorrhage. S/p laparoscopic peritoneal dialysis cath insertion 4/27 by Dr. Johney Maine. He does not have any worsening abdominal pain or swelling and is hemodynamically stable s/p 2U pRBC transfusion on 5/03 for a total of 6 units to date. Repeat CT abdomen showed improvement of hematoma. Pt was seen by Dr. Arnoldo Morale who flushed peritoneal cath and reported that operative wounds are healing. 3. Acute blood loss  anemia, Hx of anemia of chronic disease. Due to retroperitoneal hemorrhage. Anemia panel showed total iron of 37, ferritin 316, and B12 559. Hgb 8.2 today after having a total of 6 units pRBCs transfused during this hospitalization. Hemodynamically he is stable, but will continue to monitor. Will repeat CBC tomorrow after dialysis. 4. Thrombocytopenia. Etiology likely secondary to consumption thrombocytopenia from #2 and/or chronic heparin. Plat count 154. Continue to monitor closely.   5. Hypertension. Currently stable on clonidine 6. Elevated troponin. Troponin 0.06 on admission. Likely secondary to demand ischemia from hypotension and acute blood loss. Pt denies any chest pain. ECHO showed EF 55-60%.  7. Aspiration PNA causing hypoxic respiratory failure. Dysphagia is likely given hx of stroke. WBC wnl. Blood cx negative. Will discontinue Unasyn. He will have completed a course of abx. Continue nebs. 8. ESRD.  Nephrology following. Will continue on hemodialysis until peritoneal dialysis is established in the outpatient setting. 9. Ileus. Found on CT abdomen which was reviewed with Dr. Arnoldo Morale. It was felt that pt likely had post-op ileus. He does not have significant abd pain or vomiting. Recommendatinos were to continue the patient on laxatives.    DVT prophylaxis: SCDs Code Status: full Family Communication: discussed with patient. No family at the bedside Disposition Plan: discharge when improved with home health PT, hopefully tomorrow.    Consultants:   Nephrology  Procedures:   Hemodialysis 5/01  HD 5/03  2-D echocardiogram 07/18/2015- Study Conclusions - Left ventricle: The cavity size was normal. Systolic function was  normal. The estimated ejection fraction was in the range of 55%  to  60%. Wall motion was normal; there were no regional wall  motion abnormalities. Doppler parameters are consistent with  abnormal left ventricular relaxation (grade 1 diastolic   dysfunction). - Ventricular septum: Septal motion showed abnormal function and  dyssynergy. IVCD. - Aortic valve: Trileaflet; mildly thickened, mildly calcified  leaflets. There was moderate regurgitation. - Pulmonary arteries: Systolic pressure was mildly increased. PA  peak pressure: 39 mm Hg (S). Impressions: - No cardiac source of emboli was indentified. Compared to the prior study, there has been no significant interval change.  Antimicrobials:   vancomycin: 07/19/15>>5/3  Unasyn 07/18/15>> 5/05   Subjective: Feels play today. He has been eating well and has not been having any issues. He has not had a recent BM. He has an occasional cough that is not productive. He denies any abd pain.  Objective: Filed Vitals:   07/23/15 1602 07/23/15 1932 07/23/15 2212 07/24/15 0500  BP: 120/66  110/61 110/65  Pulse: 85 79 79 68  Temp: 98.9 F (37.2 C)  99.7 F (37.6 C) 99.3 F (37.4 C)  TempSrc: Oral  Oral Oral  Resp: 16 16 19 20   Height:      Weight:      SpO2: 97% 94% 98% 96%    Intake/Output Summary (Last 24 hours) at 07/24/15 0708 Last data filed at 07/23/15 1833  Gross per 24 hour  Intake    580 ml  Output      0 ml  Net    580 ml   Filed Weights   07/18/15 1345 07/20/15 1916 07/21/15 0500  Weight: 74.1 kg (163 lb 5.8 oz) 78.9 kg (173 lb 15.1 oz) 74.1 kg (163 lb 5.8 oz)    Examination:  General exam: Appears calm and comfortable  Respiratory system: Clear to auscultation. Respiratory effort normal. Cardiovascular system: S1 & S2 heard, RRR. No JVD, murmurs, rubs, gallops or clicks. No pedal edema. Gastrointestinal system: Abdomen is nondistended, soft and nontender. No organomegaly or masses felt. Normal bowel sounds heard. Central nervous system: Alert and oriented. No focal neurological deficits. Extremities: Symmetric 5 x 5 power. Skin: No rashes, lesions or ulcers Psychiatry: Judgement and insight appear normal. Mood & affect appropriate.   Data Reviewed:  I have personally reviewed following labs and imaging studies  CBC:  Recent Labs Lab 07/20/15 0423 07/21/15 0448 07/22/15 0447 07/23/15 0523 07/24/15 0520  WBC 15.2* 13.5* 10.4 11.0* 9.0  HGB 7.6* 8.1* 6.8* 9.3* 8.2*  HCT 22.1* 24.5* 20.5* 27.5* 25.0*  MCV 96.1 96.8 98.6 95.2 94.7  PLT 130* 141* 129* 137* [id number]   Basic Metabolic Panel:  Recent Labs Lab 07/18/15 0324  07/20/15 0423 07/21/15 0448 07/22/15 0447 07/23/15 0523 07/24/15 0520  NA 137  137  < > 139 138 139 138 135  K 5.0  5.0  < > 4.0 3.5 3.8 3.7 3.6  CL 101  101  < > 104 100* 101 99* 99*  CO2 21*  21*  < > 23 26 26 26 25   GLUCOSE 142*  144*  < > 115* 97 121* 101* 93  BUN 31*  32*  < > 41* 26* 41* 26* 41*  CREATININE 6.86*  6.79*  < > 8.06* 5.67* 7.65* 5.32* 7.60*  CALCIUM 8.6*  8.6*  < > 8.6* 8.5* 8.4* 8.4* 8.2*  PHOS 5.9*  --  3.9 3.3 4.2 3.8  --   < > = values in this interval not displayed. GFR: Estimated Creatinine Clearance: 9.3 mL/min (by C-G formula based on  Cr of 7.6). Liver Function Tests:  Recent Labs Lab 07/17/15 1145  07/19/15 0437 07/20/15 0423 07/21/15 0448 07/22/15 0447 07/23/15 0523  AST 27  --  39  --   --   --   --   ALT 11*  --  9*  --   --   --   --   ALKPHOS 67  --  59  --   --   --   --   BILITOT 0.4  --  1.0  --   --   --   --   PROT 7.0  --  6.4*  --   --   --   --   ALBUMIN 3.6  < > 3.3* 3.2* 3.2* 2.9* 3.1*  < > = values in this interval not displayed. No results for input(s): LIPASE, AMYLASE in the last 168 hours. No results for input(s): AMMONIA in the last 168 hours. Coagulation Profile:  Recent Labs Lab 07/18/15 1429 07/19/15 0437 07/20/15 0423 07/21/15 0448 07/22/15 0447  INR 1.50* 1.81* 1.45 1.25 1.32   Cardiac Enzymes:  Recent Labs Lab 07/17/15 1145 07/17/15 2205 07/18/15 0324 07/18/15 0928 07/18/15 1429  TROPONINI 0.06* 0.14* 0.18* 0.19* 0.18*   BNP (last 3 results) No results for input(s): PROBNP in the last 8760 hours. HbA1C: No  results for input(s): HGBA1C in the last 72 hours. CBG:  Recent Labs Lab 07/17/15 1129  GLUCAP 83   Lipid Profile: No results for input(s): CHOL, HDL, LDLCALC, TRIG, CHOLHDL, LDLDIRECT in the last 72 hours. Thyroid Function Tests: No results for input(s): TSH, T4TOTAL, FREET4, T3FREE, THYROIDAB in the last 72 hours. Anemia Panel: No results for input(s): VITAMINB12, FOLATE, FERRITIN, TIBC, IRON, RETICCTPCT in the last 72 hours. Urine analysis:    Component Value Date/Time   COLORURINE YELLOW 05/23/2014 2037   APPEARANCEUR CLEAR 05/23/2014 2037   LABSPEC 1.020 05/23/2014 2037   PHURINE 6.5 05/23/2014 2037   GLUCOSEU NEGATIVE 05/23/2014 2037   HGBUR TRACE* 05/23/2014 2037   [place] NEGATIVE 05/23/2014 2037   [place] NEGATIVE 05/23/2014 2037   PROTEINUR >300* 05/23/2014 2037   UROBILINOGEN 0.2 05/23/2014 2037   NITRITE NEGATIVE 05/23/2014 2037   LEUKOCYTESUR NEGATIVE 05/23/2014 2037   Sepsis Labs: @LABRCNTIP (procalcitonin:4,lacticidven:4)  ) Recent Results (from the past 240 hour(s))  MRSA PCR Screening     Status: None   Collection Time: 07/18/15 12:48 PM  Result Value Ref Range Status   MRSA by PCR NEGATIVE NEGATIVE Final    Comment:        The GeneXpert MRSA Assay (FDA approved for NASAL specimens only), is one component of a comprehensive MRSA colonization surveillance program. It is not intended to diagnose MRSA infection nor to guide or monitor treatment for MRSA infections.   Culture, blood (Routine X 2) w Reflex to ID Panel     Status: None (Preliminary result)   Collection Time: 07/20/15  7:30 AM  Result Value Ref Range Status   Specimen Description BLOOD RIGHT FOREARM  Final   Special Requests BOTTLES DRAWN AEROBIC AND ANAEROBIC 6CC  Final   Culture NO GROWTH 3 DAYS  Final   Report Status PENDING  Incomplete  Culture, blood (Routine X 2) w Reflex to ID Panel     Status: None (Preliminary result)   Collection Time: 07/20/15  7:36 AM  Result Value  Ref Range Status   Specimen Description BLOOD RIGHT HAND  Final   Special Requests BOTTLES DRAWN AEROBIC AND ANAEROBIC [place]  Final  Culture NO GROWTH 3 DAYS  Final   Report Status PENDING  Incomplete    Radiology Studies: Ct Abdomen Pelvis Wo Contrast  07/23/2015  CLINICAL DATA:  Patient with history of retroperitoneal bleed. Follow-up evaluation. EXAM: CT ABDOMEN AND PELVIS WITHOUT CONTRAST TECHNIQUE: Multidetector CT imaging of the abdomen and pelvis was performed following the standard protocol without IV contrast. COMPARISON:  CT 07/18/2015. FINDINGS: Lower chest: Focal consolidation within the right greater than left lower lobes. Normal heart size. Hepatobiliary: Liver is normal in size and contour. Gallbladder is decompressed and contains multiple stones. Near the distal common bile duct, in the region pancreatic head, there is a 5 mm calcific density (image 23; series 2). Pancreas: Normal in size and contour. Spleen: Unremarkable Adrenals/Urinary Tract: Stable bilateral adrenal gland thickening. Atrophy of the right kidney. No hydronephrosis. Stomach/Bowel: Fluid and stool within the rectum. Multiple gaseous and fluid filled loops of small bowel are demonstrated within the central abdomen measuring up to 4 cm. There is transition of decompressed small bowel distally within the right lower quadrant. Vascular/Lymphatic: Unchanged endovascular abdominal aortic stent graft. No retroperitoneal adenopathy. Other: Peritoneal dialysis catheter terminates within the pelvis. Interval decrease in size of previously described right retroperitoneal and hematoma (extending along the right psoas muscle and within the pelvis anteriorly) with residual blood products measuring approximately 5.3 x 1.8 cm (image 26; series 2). Collection within the pelvis measures approximately 13 x 4 cm, previously 16 x 7 cm. Additionally there is decreased blood products within the right hemipelvis. Right femoral venous catheter extends  to the heart, tip not included on current exam. Musculoskeletal: No aggressive or acute appearing osseous lesions. IMPRESSION: Interval decrease in size of retroperitoneal and pelvic hematoma located within the right hemipelvis along the right psoas muscle. Peritoneal dialysis catheter terminates within the pelvis. Distended loops of small bowel within the central abdomen measuring up to 4 cm concerning for early and/or partial small bowel obstruction with transition to decompressed small bowel in the right lower quadrant. Right greater than left lower lobe consolidation which may represent pneumonia. 5 mm calcific density near the distal common bile duct within the region of the pancreatic head is nonspecific and may represent choledocholithiasis or potentially an adjacent pancreatic parenchymal calcification. Consider correlation with laboratory analysis as well as MRCP/ERCP as clinically indicated. Electronically Signed   By: Lovey Newcomer M.D.   On: 07/23/2015 13:53    Scheduled Meds: . sodium chloride   Intravenous Once  . sodium chloride   Intravenous Once  . albuterol  2.5 mg Nebulization TID  . ampicillin-sulbactam (UNASYN) IV  3 g Intravenous Q24H  . antiseptic oral rinse  7 mL Mouth Rinse BID  . atorvastatin  80 mg Oral q1800  . citalopram  10 mg Oral Daily  . cloNIDine  0.2 mg Oral BID  . epoetin (EPOGEN/PROCRIT) injection  10,000 Units Intravenous Q M,W,F-HD  . ezetimibe  10 mg Oral QHS  . feeding supplement (NEPRO CARB STEADY)  237 mL Oral BID BM  . pantoprazole  40 mg Oral Daily  . white petrolatum   Topical BID   Continuous Infusions:    LOS: 6 days   Time spent: 25 minutes  Kathie Dike, MD [facility] Pager [phone]  If 7PM-7AM, please contact night-coverage [email or web address] Password [email or web address] 07/24/2015, 7:08 AM   By signing my name below, I, Delene Ruffini, attest that this documentation has been prepared under the direction and in the presence of Kathie Dike, MD. Electronically Signed: Rachell Cipro  [place] 07/24/2015 12:30pm  I, Dr. Kathie Dike, personally performed the services described in this documentaiton. All medical record entries made by the scribe were at my direction and in my presence. I have reviewed the chart and agree that the record reflects my personal performance and is accurate and complete  Kathie Dike, MD, 07/24/2015 12:40 PM

## 2015-07-24 NOTE — Consult Note (Signed)
Reason for Consult: Postop surgery follow-up, PD catheter evaluation Referring Physician: Dr. Loura Pardon is an 70 y.o. male.  HPI: Patient is a 70 year old black male status post laparoscopic peritoneal dialysis catheter insertion and right anal herniorrhaphy on 07/16/2015 by Dr. Johney Maine in [place] who has been in the hospital at [facility] for retroperitoneal hematoma. This has stabilized. He has multiple medical comorbidities. I was asked to see the patient as his dressings were still intact and the PD catheter had not been flushed. Patient denies any significant abdominal pain or right groin pain.  Past Medical History  Diagnosis Date  . Hypertension   . COPD (chronic obstructive pulmonary disease) ([place])   . Hyperlipidemia   . Cardiomyopathy     EF of 30% per echo in June of 2012; admitted with congestive heart failure; nonobstructive CAD on cath in 08/2010  . History of noncompliance with medical treatment   . Cerebrovascular disease 2011    CVA  in 02/2010-only deficit is decreased vision in  right eye  . Alcohol abuse     wife states he was never heavy drinker  . Tobacco abuse     40 pack years  . Abdominal aortic aneurysm ([place])     Stent graft repair  . Chronic systolic CHF (congestive heart failure) ([place])   . Leg pain   . CAD (coronary artery disease)   . Myocardial infarction acute ([place]) 10/01/11  . Effusion of right knee joint 12/06/2012  . Anemia   . Atrial fibrillation ([place])   . Ataxic gait   . Weakness generalized   . Frequent falls   . Forgetfulness   . Stroke [facility]) 2011    decreased vision of right eye    Most recent 05/23/14 - bleed - has a drag to his left leg  . Depression   . Anxiety     Takes Citalopram  . Arthritis     knees  . Dementia     early onset  . Enlarged prostate   . GERD (gastroesophageal reflux disease)   . Constipation   . Chronic kidney disease (CKD)     dialysis MWF at [facility] in [place]    Past Surgical History   Procedure Laterality Date  . Colonoscopy w/ polypectomy  2006    Dr. Tamala Julian: anal fissure, sessile adenomatous polyp, diverticulosis  . Esophagogastroduodenoscopy  11/15/2010    Procedure: ESOPHAGOGASTRODUODENOSCOPY (EGD);  Surgeon: Dorothyann Peng, MD;  Location: [facility];  Service: Endoscopy;  Laterality: N/A;  with propofol sedation; procedure start @ 0813  . Flexible sigmoidoscopy  11/15/2010    Procedure: FLEXIBLE SIGMOIDOSCOPY;  Surgeon: Dorothyann Peng, MD;  Location: [facility];  Service: Endoscopy;  Laterality: N/A;  with propofol sedation; ended @ 0808  . Polypectomy  12/13/2010    Procedure: POLYPECTOMY;  Surgeon: Dorothyann Peng, MD;  Location: [facility];  Service: Endoscopy;  Laterality: N/A;  right colon, cecal, transverse, and sigmoid polypectomy  . Cardiac catheterization  10/04/11    Normal left main, 95% pLAD stenosis with ulceration s/p successful BMS placement, 30% segmental plaque beyond this, dLAD after D2 with 50% plaquing, then focal 70% lesion, 80% focal short D2 stenosis, 30% pOM2 lesion, 30-40% mOM3 stenosis, Shephard's crook region of RCA with 50% lesion, mRCA 50-60% lesion and mild dRCA irregularities.   . Coronary stent placement  10/04/11  . Abdominal aortic aneurysm repair w/ endoluminal graft      01/16/2008  . Left heart catheterization with  coronary angiogram N/A 10/04/2011    Procedure: LEFT HEART CATHETERIZATION WITH CORONARY ANGIOGRAM;  Surgeon: Hillary Bow, MD;  Location: [facility] CATH LAB;  Service: Cardiovascular;  Laterality: N/A;  . Av fistula placement Left 07/07/2014    Procedure: Creation of Left Arm ARTERIOVENOUS Fistula;  Surgeon: Rosetta Posner, MD;  Location: [place];  Service: Vascular;  Laterality: Left;  . Insertion of dialysis catheter Right 08/05/2014    Procedure: INSERTION OF DIALYSIS CATHETER Right internal Jugular;  Surgeon: Conrad Hampstead, MD;  Location: [place];  Service: Vascular;  Laterality: Right;  . Port-a-cath removal    . Peripheral vascular  catheterization Left 01/15/2015    Procedure: Fistulagram;  Surgeon: Conrad Taylor, MD;  Location: [place] CV LAB;  Service: Cardiovascular;  Laterality: Left;  arm  . Peripheral vascular catheterization Left 01/15/2015    Procedure: Peripheral Vascular Intervention;  Surgeon: Conrad Cobden, MD;  Location: [place] CV LAB;  Service: Cardiovascular;  Laterality: Left;  ARM VEINOUS PTA  . Insertion of dialysis catheter Left 02/13/2015    Procedure: INSERTION OF DIALYSIS CATHETER AND CENTRAL VENOGRAM;  Surgeon: Serafina Mitchell, MD;  Location: [place];  Service: Vascular;  Laterality: Left;  . Peripheral vascular catheterization N/A 04/07/2015    Procedure: A/V Shuntogram/Fistulagram;  Surgeon: Conrad Windthorst, MD;  Location: [place] CV LAB;  Service: Cardiovascular;  Laterality: N/A;  . Revison of arteriovenous fistula Left 0/48/8891    Procedure: PLICATION OF LEFT ARM PSEUDOANEURYSM;  Surgeon: Conrad Hebron, MD;  Location: [place];  Service: Vascular;  Laterality: Left;  . Patch angioplasty Left 04/09/2015    Procedure: PATCH ANGIOPLASTY;  Surgeon: Conrad Steinhatchee, MD;  Location: [place];  Service: Vascular;  Laterality: Left;  . Ligation of arteriovenous  fistula Left 04/15/2015    Procedure: LIGATION OF BRACHIOCEPHALIC ARTERIOVENOUS  FISTULA;  Surgeon: Mal Misty, MD;  Location: [place];  Service: Vascular;  Laterality: Left;  . Exchange of a dialysis catheter Left 05/14/2015    Procedure: EXCHANGE OF A DIALYSIS CATHETER-LEFT INTERNAL JUGULAR VEIN;  Surgeon: Conrad Aragon, MD;  Location: [place];  Service: Vascular;  Laterality: Left;  . Removal of a dialysis catheter Left 05/30/2015    Procedure: REMOVAL OF A DIALYSIS CATHETER LEFT INTERNAL JUGULAR;  Surgeon: Conrad Glenham, MD;  Location: [place];  Service: Vascular;  Laterality: Left;  . Insertion of dialysis catheter Right 05/30/2015    Procedure: INSERTION OF DIALYSIS CATHETER;  Surgeon: Conrad Toccopola, MD;  Location: [place];  Service: Vascular;  Laterality:  Right;  . Hematoma evacuation Left 05/30/2015    Procedure: EVACUATION NECK HEMATOMA;  Surgeon: Conrad Salem, MD;  Location: [place];  Service: Vascular;  Laterality: Left;  . Capd insertion Bilateral 07/16/2015    Procedure: LAPAROSCOPIC INSERTION CONTINUOUS AMBULATORY PERITONEAL DIALYSIS  (CAPD) CATHETER;  Surgeon: Michael Boston, MD;  Location: [place];  Service: General;  Laterality: Bilateral;  . Inguinal hernia repair Right 07/16/2015    Procedure: LAPAROSCOPIC RIGHT INGUINAL HERNIA WITH MESH;  Surgeon: Michael Boston, MD;  Location: [place];  Service: General;  Laterality: Right;    Family History  Problem Relation Age of Onset  . Colon cancer Neg Hx   . Anesthesia problems Neg Hx   . Hypotension Neg Hx   . Malignant hyperthermia Neg Hx   . Pseudochol deficiency Neg Hx   . Cancer Mother     Gall Bladder  . Heart disease Mother  before age 9  . Hyperlipidemia Mother   . Hypertension Mother   . Asthma Mother   . Cancer Sister     Cervical    Social History:  reports that he quit smoking about 2 years ago. His smoking use included Cigarettes. He started smoking about 58 years ago. He has a 25 pack-year smoking history. He has never used smokeless tobacco. He reports that he does not drink alcohol or use illicit drugs.  Allergies: No Known Allergies  Medications:  Scheduled: . sodium chloride   Intravenous Once  . sodium chloride   Intravenous Once  . albuterol  2.5 mg Nebulization TID  . ampicillin-sulbactam (UNASYN) IV  3 g Intravenous Q24H  . antiseptic oral rinse  7 mL Mouth Rinse BID  . atorvastatin  80 mg Oral q1800  . citalopram  10 mg Oral Daily  . cloNIDine  0.2 mg Oral BID  . epoetin (EPOGEN/PROCRIT) injection  10,000 Units Intravenous Q M,W,F-HD  . ezetimibe  10 mg Oral QHS  . feeding supplement (NEPRO CARB STEADY)  237 mL Oral BID BM  . pantoprazole  40 mg Oral Daily  . white petrolatum   Topical BID    Results for orders placed or performed during the hospital  encounter of 07/17/15 (from the past 48 hour(s))  Prepare RBC     Status: None   Collection Time: 07/22/15 11:25 AM  Result Value Ref Range   Order Confirmation ORDER PROCESSED BY BLOOD BANK   Type and screen [facility]     Status: None   Collection Time: 07/22/15 11:25 AM  Result Value Ref Range   ABO/RH(D) B POS    Antibody Screen NEG    Sample Expiration 07/25/2015    Unit Number [id number]    Blood Component Type RED CELLS,LR    Unit division 00    Status of Unit ISSUED,FINAL    Transfusion Status OK TO TRANSFUSE    Crossmatch Result Compatible    Unit Number [id number]    Blood Component Type RBC LR PHER1    Unit division 00    Status of Unit ISSUED,FINAL    Transfusion Status OK TO TRANSFUSE    Crossmatch Result Compatible   Renal function panel     Status: Abnormal   Collection Time: 07/23/15  5:23 AM  Result Value Ref Range   Sodium 138 135 - 145 mmol/L   Potassium 3.7 3.5 - 5.1 mmol/L   Chloride 99 (L) 101 - 111 mmol/L   CO2 26 22 - 32 mmol/L   Glucose, Bld 101 (H) 65 - 99 mg/dL   BUN 26 (H) 6 - 20 mg/dL   Creatinine, Ser 5.32 (H) 0.61 - 1.24 mg/dL   Calcium 8.4 (L) 8.9 - 10.3 mg/dL   Phosphorus 3.8 2.5 - 4.6 mg/dL   Albumin 3.1 (L) 3.5 - 5.0 g/dL   GFR calc non Af Amer 10 (L) >60 mL/min   GFR calc Af Amer 11 (L) >60 mL/min    Comment: (NOTE) The eGFR has been calculated using the CKD EPI equation. This calculation has not been validated in all clinical situations. eGFR's persistently <60 mL/min signify possible Chronic Kidney Disease.    Anion gap 13 5 - 15  CBC     Status: Abnormal   Collection Time: 07/23/15  5:23 AM  Result Value Ref Range   WBC 11.0 (H) 4.0 - 10.5 K/uL   RBC 2.89 (L) 4.22 - 5.81 MIL/uL   Hemoglobin 9.3 (  L) 13.0 - 17.0 g/dL    Comment: DELTA CHECK NOTED   HCT 27.5 (L) 39.0 - 52.0 %   MCV 95.2 78.0 - 100.0 fL   MCH 32.2 26.0 - 34.0 pg   MCHC 33.8 30.0 - 36.0 g/dL   RDW 17.8 (H) 11.5 - 15.5 %   Platelets 137 (L) 150 -  400 K/uL  CBC     Status: Abnormal   Collection Time: 07/24/15  5:20 AM  Result Value Ref Range   WBC 9.0 4.0 - 10.5 K/uL   RBC 2.64 (L) 4.22 - 5.81 MIL/uL   Hemoglobin 8.2 (L) 13.0 - 17.0 g/dL   HCT 25.0 (L) 39.0 - 52.0 %   MCV 94.7 78.0 - 100.0 fL   MCH 31.1 26.0 - 34.0 pg   MCHC 32.8 30.0 - 36.0 g/dL   RDW 18.3 (H) 11.5 - 15.5 %   Platelets 154 150 - 400 K/uL  Basic metabolic panel     Status: Abnormal   Collection Time: 07/24/15  5:20 AM  Result Value Ref Range   Sodium 135 135 - 145 mmol/L   Potassium 3.6 3.5 - 5.1 mmol/L   Chloride 99 (L) 101 - 111 mmol/L   CO2 25 22 - 32 mmol/L   Glucose, Bld 93 65 - 99 mg/dL   BUN 41 (H) 6 - 20 mg/dL   Creatinine, Ser 7.60 (H) 0.61 - 1.24 mg/dL   Calcium 8.2 (L) 8.9 - 10.3 mg/dL   GFR calc non Af Amer 6 (L) >60 mL/min   GFR calc Af Amer 7 (L) >60 mL/min    Comment: (NOTE) The eGFR has been calculated using the CKD EPI equation. This calculation has not been validated in all clinical situations. eGFR's persistently <60 mL/min signify possible Chronic Kidney Disease.    Anion gap 11 5 - 15    Ct Abdomen Pelvis Wo Contrast  07/23/2015  CLINICAL DATA:  Patient with history of retroperitoneal bleed. Follow-up evaluation. EXAM: CT ABDOMEN AND PELVIS WITHOUT CONTRAST TECHNIQUE: Multidetector CT imaging of the abdomen and pelvis was performed following the standard protocol without IV contrast. COMPARISON:  CT 07/18/2015. FINDINGS: Lower chest: Focal consolidation within the right greater than left lower lobes. Normal heart size. Hepatobiliary: Liver is normal in size and contour. Gallbladder is decompressed and contains multiple stones. Near the distal common bile duct, in the region pancreatic head, there is a 5 mm calcific density (image 23; series 2). Pancreas: Normal in size and contour. Spleen: Unremarkable Adrenals/Urinary Tract: Stable bilateral adrenal gland thickening. Atrophy of the right kidney. No hydronephrosis. Stomach/Bowel: Fluid  and stool within the rectum. Multiple gaseous and fluid filled loops of small bowel are demonstrated within the central abdomen measuring up to 4 cm. There is transition of decompressed small bowel distally within the right lower quadrant. Vascular/Lymphatic: Unchanged endovascular abdominal aortic stent graft. No retroperitoneal adenopathy. Other: Peritoneal dialysis catheter terminates within the pelvis. Interval decrease in size of previously described right retroperitoneal and hematoma (extending along the right psoas muscle and within the pelvis anteriorly) with residual blood products measuring approximately 5.3 x 1.8 cm (image 26; series 2). Collection within the pelvis measures approximately 13 x 4 cm, previously 16 x 7 cm. Additionally there is decreased blood products within the right hemipelvis. Right femoral venous catheter extends to the heart, tip not included on current exam. Musculoskeletal: No aggressive or acute appearing osseous lesions. IMPRESSION: Interval decrease in size of retroperitoneal and pelvic hematoma located within the right  hemipelvis along the right psoas muscle. Peritoneal dialysis catheter terminates within the pelvis. Distended loops of small bowel within the central abdomen measuring up to 4 cm concerning for early and/or partial small bowel obstruction with transition to decompressed small bowel in the right lower quadrant. Right greater than left lower lobe consolidation which may represent pneumonia. 5 mm calcific density near the distal common bile duct within the region of the pancreatic head is nonspecific and may represent choledocholithiasis or potentially an adjacent pancreatic parenchymal calcification. Consider correlation with laboratory analysis as well as MRCP/ERCP as clinically indicated. Electronically Signed   By: Lovey Newcomer M.D.   On: 07/23/2015 13:53    ROS:  Pertinent items noted in HPI and remainder of comprehensive ROS otherwise negative.  Blood  pressure 110/65, pulse 68, temperature 99.3 F (37.4 C), temperature source Oral, resp. rate 20, height 6' 3"  (1.905 m), weight 74.1 kg (163 lb 5.8 oz), SpO2 97 %. Physical Exam: Pleasant black male in no acute distress. Abdomen is soft and nondistended. Dressings were removed. Incisions healing well. Peritoneal dialysis catheter flushed with saline. Some sanguinous fluid and noted to be emanating in the PD catheter on aspiration.  Assessment/Plan: Impression: Status post laparoscopic peritoneal dialysis catheter insertion, right anal herniorrhaphy. Incisions are all healing well. PD catheter flushed without difficulty. Please call me if I can be of further assistance.  Jazlynne Milliner A 07/24/2015, 8:55 AM

## 2015-07-24 NOTE — Progress Notes (Signed)
Subjective: Interval History: Patient complains of poor appetite but no nausea or vomiting. No abdominal pain  Objective: Vital signs in last 24 hours: Temp:  [98.9 F (37.2 C)-99.7 F (37.6 C)] 99.3 F (37.4 C) (05/05 0500) Pulse Rate:  [66-85] 68 (05/05 0500) Resp:  [16-20] 20 (05/05 0500) BP: (110-120)/(59-66) 110/65 mmHg (05/05 0500) SpO2:  [94 %-98 %] 97 % (05/05 0724) Weight change:   Intake/Output from previous day: 05/04 0701 - 05/05 0700 In: 580 [P.O.:480; IV Piggyback:100] Out: -  Intake/Output this shift:    Generally: He is alert ani in no apparent distree Chest is clear Heart RRR no murmur Abdomen: Still with dressing. None tender and positive bowl sound Extremities:No edema  Lab Results:  Recent Labs  07/23/15 0523 07/24/15 0520  WBC 11.0* 9.0  HGB 9.3* 8.2*  HCT 27.5* 25.0*  PLT 137* 154   BMET:   Recent Labs  07/23/15 0523 07/24/15 0520  NA 138 135  K 3.7 3.6  CL 99* 99*  CO2 26 25  GLUCOSE 101* 93  BUN 26* 41*  CREATININE 5.32* 7.60*  CALCIUM 8.4* 8.2*   No results for input(s): PTH in the last 72 hours. Iron Studies: No results for input(s): IRON, TIBC, TRANSFERRIN, FERRITIN in the last 72 hours.  Studies/Results: Ct Abdomen Pelvis Wo Contrast  07/23/2015  CLINICAL DATA:  Patient with history of retroperitoneal bleed. Follow-up evaluation. EXAM: CT ABDOMEN AND PELVIS WITHOUT CONTRAST TECHNIQUE: Multidetector CT imaging of the abdomen and pelvis was performed following the standard protocol without IV contrast. COMPARISON:  CT 07/18/2015. FINDINGS: Lower chest: Focal consolidation within the right greater than left lower lobes. Normal heart size. Hepatobiliary: Liver is normal in size and contour. Gallbladder is decompressed and contains multiple stones. Near the distal common bile duct, in the region pancreatic head, there is a 5 mm calcific density (image 23; series 2). Pancreas: Normal in size and contour. Spleen: Unremarkable  Adrenals/Urinary Tract: Stable bilateral adrenal gland thickening. Atrophy of the right kidney. No hydronephrosis. Stomach/Bowel: Fluid and stool within the rectum. Multiple gaseous and fluid filled loops of small bowel are demonstrated within the central abdomen measuring up to 4 cm. There is transition of decompressed small bowel distally within the right lower quadrant. Vascular/Lymphatic: Unchanged endovascular abdominal aortic stent graft. No retroperitoneal adenopathy. Other: Peritoneal dialysis catheter terminates within the pelvis. Interval decrease in size of previously described right retroperitoneal and hematoma (extending along the right psoas muscle and within the pelvis anteriorly) with residual blood products measuring approximately 5.3 x 1.8 cm (image 26; series 2). Collection within the pelvis measures approximately 13 x 4 cm, previously 16 x 7 cm. Additionally there is decreased blood products within the right hemipelvis. Right femoral venous catheter extends to the heart, tip not included on current exam. Musculoskeletal: No aggressive or acute appearing osseous lesions. IMPRESSION: Interval decrease in size of retroperitoneal and pelvic hematoma located within the right hemipelvis along the right psoas muscle. Peritoneal dialysis catheter terminates within the pelvis. Distended loops of small bowel within the central abdomen measuring up to 4 cm concerning for early and/or partial small bowel obstruction with transition to decompressed small bowel in the right lower quadrant. Right greater than left lower lobe consolidation which may represent pneumonia. 5 mm calcific density near the distal common bile duct within the region of the pancreatic head is nonspecific and may represent choledocholithiasis or potentially an adjacent pancreatic parenchymal calcification. Consider correlation with laboratory analysis as well as MRCP/ERCP as clinically indicated.  Electronically Signed   By: Lovey Newcomer  M.D.   On: 07/23/2015 13:53    I have reviewed the patient's current medications.  Assessment/Plan: Problem #1 History of TIA . Patient seems better Problem #2 Pneumonia: Patient afebrile and hs white blood cell count is normal Problem #3 anemia: His hemoglobin declined again yesterday and received 2 units of PRBC . Etiology not clear. Initially taught to be secondary to bleeding post surgery. Received blood transfusion and his hemoglobin is low . No other source of blood loss Problem #4: Hypertension: His blood pressure is reasonably contolled Problem #5 . Retroperitoeal hematoma.Patient had Peritoneal  dialysis catheter placed and also right hernia repair. CT scan showed improvement Problem #6 metabolic bone disease: His calcium and phosphorus is in range.  Problem #7 history of COPD Problem #8 history of abdominal aortic aneurysm. Problem#9 ESRD; S/P hemodialysis on Wednsday/ He is do for dialysis today Plan: 1] We'll make arrangements for patient to get dialysis  2] we'll continue his antibiotics 3] we'll check CBC and renal panel in am 4] An attempt was made to find PD nurse to flush his catheter was unsuccessful. No body asigend and  And our regular hemodialysis nurse does not have a privilege to do flush and dress the catheter or the site. The only option we have is to send patient to [facility] or try to use the surgeons here. We will consult surgery and see if they can flush and clean dress the site   LOS: 6 days   Mardella Nuckles S 07/24/2015,7:54 AM

## 2015-07-24 NOTE — Procedures (Signed)
   HEMODIALYSIS TREATMENT NOTE:  3.5 hour heparin-free dialysis completed via right femoral tunneled catheter. Exit site unremarkable. Goal met: 1 liter removed without interruption in ultrafiltration. All blood was returned.  Report given to Delton Coombes, RN.  Rockwell Alexandria, RN, CDN

## 2015-07-24 NOTE — Progress Notes (Signed)
Pt in dialysis

## 2015-07-24 NOTE — Progress Notes (Signed)
Pharmacy Antibiotic Note  [name] is a 70 y.o. male admitted on 07/17/2015 with pneumonia.  Pharmacy has been consulted for Unasyn dosing.   Plan: Cont Unasyn 3 gm IV q24 hours F/u cultures and clinical course  Height: 6\' 3"  (190.5 cm) Weight:  (bedscale inoperable) IBW/kg (Calculated) : 84.5  Temp (24hrs), Avg:99.3 F (37.4 C), Min:98.9 F (37.2 C), Max:99.7 F (37.6 C)   Recent Labs Lab 07/20/15 0423 07/21/15 0448 07/22/15 0447 07/23/15 0523 07/24/15 0520  WBC 15.2* 13.5* 10.4 11.0* 9.0  CREATININE 8.06* 5.67* 7.65* 5.32* 7.60*    Estimated Creatinine Clearance: 9.3 mL/min (by C-G formula based on Cr of 7.6).    No Known Allergies  Antimicrobials this admission: 4/29 unasyn >>  4/30 Vanc>>5/4  Cultures: 5/1 Blood cultures: pending 4/29 MRSA PCR: negative  Thank you for allowing pharmacy to be a part of this patient's care.  Hart Robinsons, PharmD Clinical Pharmacist Pager:  [phone] 07/24/2015 11:50 AM

## 2015-07-24 NOTE — Care Management Important Message (Signed)
Important Message  Patient Details  Name: JAYCEE LABARR MRN: [id number] Date of Birth: [birth year]   Medicare Important Message Given:  Yes    Alvie Heidelberg, RN 07/24/2015, 2:38 PM

## 2015-07-25 LAB — CBC
HEMATOCRIT: 27 % — AB (ref 39.0–52.0)
Hemoglobin: 8.8 g/dL — ABNORMAL LOW (ref 13.0–17.0)
MCH: 31.1 pg (ref 26.0–34.0)
MCHC: 32.6 g/dL (ref 30.0–36.0)
MCV: 95.4 fL (ref 78.0–100.0)
PLATELETS: 190 10*3/uL (ref 150–400)
RBC: 2.83 MIL/uL — AB (ref 4.22–5.81)
RDW: 17.5 % — AB (ref 11.5–15.5)
WBC: 7.9 10*3/uL (ref 4.0–10.5)

## 2015-07-25 LAB — CULTURE, BLOOD (ROUTINE X 2)
Culture: NO GROWTH
Culture: NO GROWTH

## 2015-07-25 MED ORDER — ASPIRIN EC 81 MG PO TBEC: 81.0000 mg | DELAYED_RELEASE_TABLET | Freq: Every evening | ORAL | Status: AC

## 2015-07-25 MED ORDER — CLONIDINE HCL 0.2 MG PO TABS: 0.2000 mg | ORAL_TABLET | Freq: Two times a day (BID) | ORAL | Status: DC

## 2015-07-25 MED ORDER — ALBUTEROL SULFATE (2.5 MG/3ML) 0.083% IN NEBU: 2.5000 mg | INHALATION_SOLUTION | Freq: Four times a day (QID) | RESPIRATORY_TRACT | Status: DC | PRN

## 2015-07-25 NOTE — Progress Notes (Signed)
[name]  MRN: [id number]  DOB/AGE: 71-Apr-1947 71 y.o.  Primary Care Physician:Scott Wolfgang Phoenix, MD  Admit date: 07/17/2015  Chief Complaint:  Chief Complaint  Patient presents with  . Altered Mental Status    S-Pt presented on  07/17/2015 with  Chief Complaint  Patient presents with  . Altered Mental Status  .    Pt offers no new complaints  .  Meds  . sodium chloride   Intravenous Once  . sodium chloride   Intravenous Once  . albuterol  2.5 mg Nebulization TID  . antiseptic oral rinse  7 mL Mouth Rinse BID  . atorvastatin  80 mg Oral q1800  . citalopram  10 mg Oral Daily  . cloNIDine  0.2 mg Oral BID  . epoetin (EPOGEN/PROCRIT) injection  10,000 Units Intravenous Q M,W,F-HD  . ezetimibe  10 mg Oral QHS  . feeding supplement (NEPRO CARB STEADY)  237 mL Oral BID BM  . milk and molasses  1 enema Rectal Once  . pantoprazole  40 mg Oral Daily  . polyethylene glycol  17 g Oral BID  . white petrolatum   Topical BID      Physical Exam: Vital signs in last 24 hours: Temp:  [97.9 F (36.6 C)-98.3 F (36.8 C)] 97.9 F (36.6 C) (05/06 1514) Pulse Rate:  [66-76] 76 (05/06 1514) Resp:  [18] 18 (05/06 1514) BP: (114-131)/(65-79) 114/79 mmHg (05/06 1514) SpO2:  [94 %-99 %] 94 % (05/06 1554) Weight:  [159 lb 8 oz (72.349 kg)] 159 lb 8 oz (72.349 kg) (05/06 0706) Weight change:  Last BM Date: 07/24/15  Intake/Output from previous day: 05/05 0701 - 05/06 0700 In: 480 [P.O.:480] Out: 1100      Physical Exam: General- pt is awake,alert.orientation much better. Resp- No acute REsp distress, CTA B/L NO Rhonchi CVS- S1S2 regular in rate and rhythm GIT- BS+, soft, ND, PD cath in situ EXT- NO LE Edema, Cyanosis Access- Right thigh PC  Lab Results: CBC  Recent Labs  07/24/15 0520 07/25/15 0514  WBC 9.0 7.9  HGB 8.2* 8.8*  HCT 25.0* 27.0*  PLT 154 190    BMET  Recent Labs  07/23/15 0523 07/24/15 0520  NA 138 135  K 3.7 3.6  CL 99* 99*  CO2 26 25   GLUCOSE 101* 93  BUN 26* 41*  CREATININE 5.32* 7.60*  CALCIUM 8.4* 8.2*    MICRO Recent Results (from the past 240 hour(s))  MRSA PCR Screening     Status: None   Collection Time: 07/18/15 12:48 PM  Result Value Ref Range Status   MRSA by PCR NEGATIVE NEGATIVE Final    Comment:        The GeneXpert MRSA Assay (FDA approved for NASAL specimens only), is one component of a comprehensive MRSA colonization surveillance program. It is not intended to diagnose MRSA infection nor to guide or monitor treatment for MRSA infections.   Culture, blood (Routine X 2) w Reflex to ID Panel     Status: None   Collection Time: 07/20/15  7:30 AM  Result Value Ref Range Status   Specimen Description BLOOD RIGHT FOREARM  Final   Special Requests BOTTLES DRAWN AEROBIC AND ANAEROBIC 6CC  Final   Culture NO GROWTH 5 DAYS  Final   Report Status 07/25/2015 FINAL  Final  Culture, blood (Routine X 2) w Reflex to ID Panel     Status: None   Collection Time: 07/20/15  7:36 AM  Result Value Ref Range  Status   Specimen Description BLOOD RIGHT HAND  Final   Special Requests BOTTLES DRAWN AEROBIC AND ANAEROBIC 6CC  Final   Culture NO GROWTH 5 DAYS  Final   Report Status 07/25/2015 FINAL  Final      Lab Results  Component Value Date   CALCIUM 8.2* 07/24/2015   CAION 1.08* 04/07/2015   PHOS 3.8 07/23/2015        Impression: 1)Renal  ESRD on HD                Pt is on Monday/Wednesday/Friday schedule                Pt dialyzed yesterday                NO need todialyze  today  2)HTN  Medication- On Calcium Channel Blockers    3)Anemia HGb not at goal (9--11)   In ESRD the goal for Hgb is 9--11   Anemia sec to  Retro-peritoneal hematoma      Recived PRBC     On epo   4)CKD Mineral-Bone Disorder  Phosphorus now at goal. Calcium is at goal.( after correcting for low albumin)  5)CNS- admitted with CVA/TIA Primary MD  following  6)Electrolytes Normokalemic NOrmonatremic    7)Acid base Co2 at goal     Plan:  Will continue current care Pt to continue HD till transitioned to PD.   Orient S 07/25/2015, 8:01 PM

## 2015-07-25 NOTE — Progress Notes (Signed)
Patient's IVs (2) removed.  Sites WNL.  AVS reviewed with patient and patient's wife.  Verbalized understanding of discharge instructions, physician follow-up, and medications.  Prescription given to patient's wife.  Reports belongings intact and in possession at time of discharge.  [facility] delivered nebulizer machine to room prior to patient's discharge.  Patient stable and awaiting transport by NT to main entrance for discharge.

## 2015-07-25 NOTE — Progress Notes (Signed)
Physical Therapy Treatment Patient Details Name: [name] MRN: [id number] DOB: 1945-12-27 Today's Date: 07/25/2015    History of Present Illness [name] is a 70 y.o. 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 The patient recently underwent peritoneal dialysis placement by general surgery yesterday. The patient underwent the procedure without any difficulty. The atrial catheter was placed due to end-stage renal disease with underlying venous access. This morning the patient awoke and was noticed to have a left sided facial droop by his wife. Patient was unable to verbalize very well during that time.. When she exited the room and returned, the patient was lying flat on his back and appeared to be diaphoretic and not responding very well. His wife called EMS who brought the patient in his construct. The patient began to recall things after EMS arrived. Patient shows no deficits since being in the emergency department.  Since admission pt has had low H&H, however today it is 8.1/24.5.  Pt is also suspected to have aspiration PNA.  CT of abdomen/pelvis revealed retroperitoneal hematoma.     PT Comments    Pt needs increased time for bed mobility.  Pt able to ambulate 60 feet with rolling walker.  Explained to pt to gain strength at home he should walk frequently but short distances.   Follow Up Recommendations  Home health PT     Equipment Recommendations  None recommended by PT    Recommendations for Other Services  none     Precautions / Restrictions Precautions Precautions: None Restrictions Weight Bearing Restrictions: No    Mobility  Bed Mobility Overal bed mobility: Modified Independent       Supine to sit: Modified independent (Device/Increase time)        Transfers Overall transfer level: Modified independent Equipment used: Rolling walker (2 wheeled) Transfers: Sit to/from Stand Sit to Stand: Modified independent (Device/Increase time) Stand pivot transfers: Modified independent (Device/Increase time)          Ambulation/Gait Ambulation/Gait assistance: Modified independent (Device/Increase time) Ambulation Distance (Feet): 60 Feet Assistive device: Rolling walker (2 wheeled) Gait Pattern/deviations: Decreased step length - right;Decreased step length - left   Gait velocity interpretation: Below normal speed for age/gender     Stairs            Wheelchair Mobility    Modified Rankin (Stroke Patients Only)       Balance                                    Cognition Arousal/Alertness: Awake/alert Behavior During Therapy: WFL for tasks assessed/performed Overall Cognitive Status: Within Functional Limits for tasks assessed                      Exercises      General Comments        Pertinent Vitals/Pain Pain Assessment: No/denies pain       Prior Function            PT Goals (current goals can now be found in the care plan section) Acute Rehab PT Goals Patient Stated Goal: Pt wants to go home.  Time For Goal Achievement: 08/04/15 Potential to Achieve Goals: Good Progress towards PT  goals: Progressing toward goals    Frequency  Min 3X/week    PT Plan Current plan remains appropriate    Co-evaluation             End of Session Equipment Utilized During Treatment: Gait belt Activity Tolerance: Patient tolerated treatment well Patient left: in chair;with call bell/phone within reach     Time: 1240-1300 PT Time Calculation (min) (ACUTE ONLY): 20 min  Charges:  $Gait Training: 8-22 mins                    G CodesRayetta Humphrey, PT CLT [phone] 07/25/2015, 1:01 PM

## 2015-07-25 NOTE — Discharge Summary (Signed)
Physician Discharge Summary  [name] [id number] DOB: 25-Feb-1946 DOA: 07/17/2015  PCP: [name], MD  Admit date: 07/17/2015 Discharge date: 07/25/2015  Time spent: 35 minutes  Recommendations for Outpatient Follow-up:  1. Follow up with PCP in 1-2 weeks. 2. Discharge with home health PT   Discharge Diagnoses:  Principal Problem:   TIA (transient ischemic attack) Active Problems:   End-stage renal disease on hemodialysis (HCC)   Hyperkalemia   Elevated troponin I level   Retroperitoneal hemorrhage   Acute blood loss anemia   Hypotension   Aspiration pneumonia (HCC)   Thrombocytopenia (HCC)   Coagulopathy (HCC)   Fecal impaction (HCC)   Essential hypertension   Acute respiratory failure with hypoxia [facility])   Discharge Condition: improved  Diet recommendation: heart healthy  Filed Weights   07/21/15 0500 07/25/15 0706  Weight: 74.1 kg (163 lb 5.8 oz) 72.349 kg (159 lb 8 oz)    History of present illness:  70 yom with a 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 He presented with complaints of a left sided facial droop that he noticed the morning after surgery. He was unable to verbalize very well during that time. His wife called EMS when she noticed that he was diaphoretic and poorly responsive. He was referred for further monitoring.  Hospital Course:  Patient was found to have a left-sided facial droop, likely due TIA, and possibly contributed to by hyperkalemia. Due to his bleeding, his ASA which he takes due to his hx of CVA, was held on admission. His facial droop resolved and imaging studies were shown to be negative for any acute change.  An ECHO was performed  showed EF 55-60%, and ruled out any likely cardiac source.  Will restart baby aspirin on discharge and recommend increasing to 325mg  in 2 weeks. It is recommended that he follow up with his PCP in 1-2 weeks. 1. Retroperitoneal hemorrhage. S/p laparoscopic peritoneal dialysis cath insertion 4/27 and hernia repair by Dr. Johney Maine. Shortly after admission, patient became hypotensive and significantly anemic. CT scan indicated retroperitoneal hemorrhage. Case was discussed with Dr. Johney Maine who recommended supportive tx and transfusions. Pt was transfused a total of 6 units pRBCs. Follow up CT scan showed decrease in size of hematoma. His Hgb has remained stable. Pt was seen by Dr. Arnoldo Morale who flushed peritoneal cath and reported that operative wounds are healing. 2. Acute blood loss anemia, Hx of anemia of chronic disease. Due to retroperitoneal hemorrhage. Anemia panel showed total iron of 37, ferritin 316, and B12 559. Hgb 8.8 today after having a total of 6 units pRBCs transfused during this hospitalization. Hemodynamically he is stable, but will continue to monitor. CBC has remained stable after dialysis. 3. Thrombocytopenia. Etiology likely secondary to consumption thrombocytopenia from #2 and/or chronic heparin. Plat count 190. Continue to monitor closely.  4. Hypertension. Currently stable on clonidine 5. Elevated troponin. Troponin 0.06 on admission. Likely secondary to demand ischemia from hypotension and acute blood loss. Pt denies any chest pain. ECHO showed EF 55-60%.  6. Aspiration PNA causing hypoxic respiratory failure. Dysphagia is likely given hx of stroke. WBC wnl. Blood cx negative. He has completed a course of abx. Continue nebs. He is maintaining O2 sats greater than 90& on RA.  7. ESRD. Nephrology following. Will  continue on hemodialysis until peritoneal dialysis is established in the outpatient setting. 8. Ileus. Found on CT abdomen which was reviewed with Dr. Arnoldo Morale. It was felt that pt likely had post-op ileus. He does not have significant abd  pain or vomiting. Recommendatinos were to continue the patient on laxatives.  Procedures:  Hemodialysis 5/01  HD 5/03  2-D echocardiogram 07/18/2015- Study Conclusions - Left ventricle: The cavity size was normal. Systolic function was  normal. The estimated ejection fraction was in the range of 55%  to 60%. Wall motion was normal; there were no regional wall  motion abnormalities. Doppler parameters are consistent with  abnormal left ventricular relaxation (grade 1 diastolic  dysfunction). - Ventricular septum: Septal motion showed abnormal function and  dyssynergy. IVCD. - Aortic valve: Trileaflet; mildly thickened, mildly calcified  leaflets. There was moderate regurgitation. - Pulmonary arteries: Systolic pressure was mildly increased. PA  peak pressure: 39 mm Hg (S). Impressions: - No cardiac source of emboli was indentified. Compared to the prior study, there has been no significant interval change.  Consultations:  Nephrology  General surgery.  Discharge Exam: Filed Vitals:   07/24/15 1830 07/25/15 0706  BP: 130/64 131/65  Pulse: 70 66  Temp: 97.8 F (36.6 C) 98.3 F (36.8 C)  Resp: 18 18   Examination:  General exam: Appears calm and comfortable  Respiratory system: Clear to auscultation. Respiratory effort normal. Cardiovascular system: S1 & S2 heard, RRR. No JVD, murmurs, rubs, gallops or clicks. No pedal edema. Gastrointestinal system: Abdomen is nondistended, soft and nontender. No organomegaly or masses felt. Normal bowel sounds heard. Central nervous system: Alert and oriented. No focal neurological deficits. Extremities: Symmetric 5 x 5 power. Skin: No rashes, lesions or ulcers Psychiatry: Judgement and insight appear normal. Mood & affect appropriate.   Discharge Instructions    Current Discharge Medication List    CONTINUE these medications which have NOT CHANGED   Details  amLODipine (NORVASC) 5 MG tablet Take 1 tablet (5 mg total)  by mouth daily. Qty: 30 tablet, Refills: 6    aspirin EC 81 MG tablet Take 81 mg by mouth every evening.     b complex-vitamin c-folic acid (NEPHRO-VITE) 0.8 MG TABS tablet Take 1 tablet by mouth daily. Refills: 11    citalopram (CELEXA) 10 MG tablet TAKE 1 TABLET BY MOUTH EVERY DAY Qty: 30 tablet, Refills: 5    ezetimibe (ZETIA) 10 MG tablet TAKE 1 TABLET (10 MG TOTAL) BY MOUTH DAILY. Qty: 30 tablet, Refills: 6    oxyCODONE (OXY IR/ROXICODONE) 5 MG immediate release tablet Take 1-2 tablets (5-10 mg total) by mouth every 6 (six) hours as needed for moderate pain, severe pain or breakthrough pain. Qty: 30 tablet, Refills: 0    pantoprazole (PROTONIX) 40 MG tablet TAKE 1 TABLET (40 MG TOTAL) BY MOUTH DAILY. Qty: 30 tablet, Refills: 11    polyethylene glycol (MIRALAX / GLYCOLAX) packet Take 17 g by mouth daily. Qty: 14 each, Refills: 0    atorvastatin (LIPITOR) 80 MG tablet TAKE 1 TABLET (80 MG TOTAL) BY MOUTH DAILY AT 6 PM. Qty: 90 tablet, Refills: 3    oxyCODONE-acetaminophen (PERCOCET/ROXICET) 5-325 MG tablet Take 1 tablet by mouth every 6 (six) hours as needed. Qty: 30 tablet, Refills: 0       No Known Allergies    The results of significant diagnostics from this hospitalization (including imaging, microbiology, ancillary and laboratory) are listed below for reference.    Significant Diagnostic Studies: Ct Abdomen Pelvis Wo Contrast  07/23/2015  CLINICAL DATA:  Patient with history of retroperitoneal bleed. Follow-up evaluation. EXAM: CT ABDOMEN AND PELVIS WITHOUT CONTRAST TECHNIQUE: Multidetector CT imaging of the abdomen and pelvis was performed following the standard protocol without IV contrast. COMPARISON:  CT 07/18/2015. FINDINGS: Lower chest: Focal consolidation within the right greater than left lower lobes. Normal heart size. Hepatobiliary: Liver is normal in size and contour. Gallbladder is decompressed and contains multiple stones. Near the distal common bile duct,  in the region pancreatic head, there is a 5 mm calcific density (image 23; series 2). Pancreas: Normal in size and contour. Spleen: Unremarkable Adrenals/Urinary Tract: Stable bilateral adrenal gland thickening. Atrophy of the right kidney. No hydronephrosis. Stomach/Bowel: Fluid and stool within the rectum. Multiple gaseous and fluid filled loops of small bowel are demonstrated within the central abdomen measuring up to 4 cm. There is transition of decompressed small bowel distally within the right lower quadrant. Vascular/Lymphatic: Unchanged endovascular abdominal aortic stent graft. No retroperitoneal adenopathy. Other: Peritoneal dialysis catheter terminates within the pelvis. Interval decrease in size of previously described right retroperitoneal and hematoma (extending along the right psoas muscle and within the pelvis anteriorly) with residual blood products measuring approximately 5.3 x 1.8 cm (image 26; series 2). Collection within the pelvis measures approximately 13 x 4 cm, previously 16 x 7 cm. Additionally there is decreased blood products within the right hemipelvis. Right femoral venous catheter extends to the heart, tip not included on current exam. Musculoskeletal: No aggressive or acute appearing osseous lesions. IMPRESSION: Interval decrease in size of retroperitoneal and pelvic hematoma located within the right hemipelvis along the right psoas muscle. Peritoneal dialysis catheter terminates within the pelvis. Distended loops of small bowel within the central abdomen measuring up to 4 cm concerning for early and/or partial small bowel obstruction with transition to decompressed small bowel in the right lower quadrant. Right greater than left lower lobe consolidation which may represent pneumonia. 5 mm calcific density near the distal common bile duct within the region of the pancreatic head is nonspecific and may represent choledocholithiasis or potentially an adjacent pancreatic parenchymal  calcification. Consider correlation with laboratory analysis as well as MRCP/ERCP as clinically indicated. Electronically Signed   By: Lovey Newcomer M.D.   On: 07/23/2015 13:53   Ct Abdomen Pelvis Wo Contrast  07/18/2015  CLINICAL DATA:  Anemia, concern for intra-abdominal bleed. Peritoneal catheter placed yesterday. History of abdominal aortic aneurysm EXAM: CT ABDOMEN AND PELVIS WITHOUT CONTRAST TECHNIQUE: Multidetector CT imaging of the abdomen and pelvis was performed following the standard protocol without IV contrast. COMPARISON:  01/01/2015 FINDINGS: Lower chest: There is right lower lobe consolidation and a small right pleural effusion. There is debris in the lower lobe bronchus on the right. Hepatobiliary: There is an approximately 7 mm gallstone. Liver appears normal. Pancreas: No significant pancreatic abnormalities Spleen: The spleen is relatively diminutive but normal Adrenals/Urinary Tract: Adrenal glands are negative. Both kidneys are atrophic. Bladder is decompressed with thick wall. Stomach/Bowel: Fecal impaction in the rectum with distention of the rectum with stool. Nonobstructive gas pattern. Vascular/Lymphatic: Abdominal aortic aneurysm stable with aortic stent graft. Right iliac artery dilatation also stable. No significant adenopathy. Reproductive: Negative Other: There is a small volume of ascites in the abdomen. New there is a right femoral venous catheter extending through the heart and into the superior vena cava. There is a small volume of pneumoperitoneum in the abdomen and pelvis. This is likely related to the recent placement of left lower quadrant peritoneal catheter. There  is hyper attenuating fluid in the right perinephric space extending along the right psoas muscle into the pelvis anteriorly. This continues to the pelvic floor and extends around the right and anterior wall of the bladder with mild mass effect on the bladder. The collection measures 79 x 61 mm at the level of the  sacral promontory. In the posterior perinephric space the collection measures 30 x 65 mm. Musculoskeletal: There are no acute musculoskeletal findings. IMPRESSION: 1. Retroperitoneal hematoma on the right 2. Small volume pneumoperitoneum presumably related to peritoneal catheter placement 3. Fecal impaction in the rectum 4. Aortic stent graft 5. Cholelithiasis 6. Debris in right lower lobe bronchus may represent mucous plugging or aspirated material. Right lower lobe consolidation with pleural effusion. Aspiration pneumonitis is 1 consideration. Other possibilities include pneumonia. Electronically Signed   By: Skipper Cliche M.D.   On: 07/18/2015 09:01   Ct Head Wo Contrast  07/17/2015  CLINICAL DATA:  Difficulty speaking EXAM: CT HEAD WITHOUT CONTRAST TECHNIQUE: Contiguous axial images were obtained from the base of the skull through the vertex without intravenous contrast. COMPARISON:  05/04/2015 FINDINGS: Bony calvarium is intact. Diffuse atrophic changes are noted. Chronic white matter ischemic changes are seen as well prior lacunar infarcts are noted within the basal ganglia bilaterally. No acute hemorrhage or acute infarction is noted. IMPRESSION: Chronic atrophic and ischemic changes No acute abnormality noted. Electronically Signed   By: Inez Catalina M.D.   On: 07/17/2015 12:48   Mr Brain Wo Contrast  07/17/2015  CLINICAL DATA:  Left facial droop.  CVA. EXAM: MRI HEAD WITHOUT CONTRAST TECHNIQUE: Multiplanar, multiecho pulse sequences of the brain and surrounding structures were obtained without intravenous contrast. COMPARISON:  CT head 07/17/2015 FINDINGS: Negative for acute infarct. Extensive chronic microvascular ischemic change throughout the white matter extending into the basal ganglia and thalamus bilaterally. Chronic ischemia in the pons bilaterally. Small chronic infarcts in the cerebellum bilaterally. Moderate atrophy.  Negative for hydrocephalus. Chronic hemorrhage in the basal ganglia  bilaterally. Chronic hemorrhage in the right pons. This is likely related to chronic poorly controlled hypertension. Negative for mass or edema. Mild mucosal edema paranasal sinuses. Normal orbit bilaterally. Pituitary and skull base normal. IMPRESSION: Negative for acute infarct. Extensive chronic ischemic change. Chronic micro hemorrhage in the basal ganglia bilaterally and right pons likely related to poorly controlled hypertension. Electronically Signed   By: Franchot Gallo M.D.   On: 07/17/2015 14:22   US Carotid Bilateral  07/18/2015  CLINICAL DATA:  TIA EXAM: BILATERAL CAROTID DUPLEX ULTRASOUND TECHNIQUE: Pearline Cables scale imaging, color Doppler and duplex ultrasound were performed of bilateral carotid and vertebral arteries in the neck. COMPARISON:  None. FINDINGS: Criteria: Quantification of carotid stenosis is based on velocity parameters that correlate the residual internal carotid diameter with NASCET-based stenosis levels, using the diameter of the distal internal carotid lumen as the denominator for stenosis measurement. The following velocity measurements were obtained: RIGHT ICA:  115 cm/sec CCA:  99991111 cm/sec SYSTOLIC ICA/CCA RATIO:  1.0 DIASTOLIC ICA/CCA RATIO:  2.1 ECA:  47 cm/sec LEFT ICA:  101 cm/sec CCA:  99991111 cm/sec SYSTOLIC ICA/CCA RATIO:  0.9 DIASTOLIC ICA/CCA RATIO:  1.3 ECA:  63 cm/sec RIGHT CAROTID ARTERY: There is moderate irregular plaque along the common carotid. There is moderate irregular plaque in the bulb. Low resistance internal carotid Doppler pattern is preserved. RIGHT VERTEBRAL ARTERY:  Antegrade with a normal Doppler pattern. LEFT CAROTID ARTERY: There is minimal smooth plaque along the common carotid. There is moderate irregular and  calcified plaque in the bulb. Low resistance internal carotid Doppler pattern is preserved. LEFT VERTEBRAL ARTERY: Antegrade with a low resistance Doppler pattern. IMPRESSION: There is plaque in both internal carotid artery bulbs and common carotid  arteries. Estimated stenosis is less than 50% bilaterally. Electronically Signed   By: Marybelle Killings M.D.   On: 07/18/2015 10:57   Dg Chest Port 1 View  07/18/2015  CLINICAL DATA:  Patient with history of end-stage renal disease. EXAM: PORTABLE CHEST 1 VIEW COMPARISON:  Chest radiograph 05/30/2015. FINDINGS: Monitoring leads overlie the patient. Inferior approach central venous catheter present with tip terminating at the level of the superior vena cava. Stable cardiac and mediastinal contours. Low lung volumes. Interval development bibasilar airspace opacities. Circumferential lucency right upper hemi thorax. IMPRESSION: Circumferential lucency right upper hemi thorax likely secondary to skin fold. Small right apical pneumothorax not excluded. Recommend repeat radiograph with improved aeration. Low lung volumes with basilar airspace opacities favored represent atelectasis. These results will be called to the ordering clinician or representative by the Radiologist Assistant, and communication documented in the PACS or zVision Dashboard. Electronically Signed   By: Lovey Newcomer M.D.   On: 07/18/2015 10:03   Dg Chest Port 1v Same Day  07/18/2015  CLINICAL DATA:  70 year old male with history of abnormal chest x-ray. Followup study. EXAM: PORTABLE CHEST 1 VIEW COMPARISON:  Chest x-ray 07/18/2015. FINDINGS: Lung volumes are low. Bibasilar areas of subsegmental atelectasis. No consolidative airspace disease. No pleural effusions. No pneumothorax (previously suspected skin fold in the apex of the right hemithorax has resolved). No pulmonary nodule or mass noted. Pulmonary vasculature and the cardiomediastinal silhouette are within normal limits. PermCath from IVC approach with tip terminating in the superior vena cava. IMPRESSION: 1. No pneumothorax. 2. Low lung volumes with bibasilar subsegmental atelectasis. Electronically Signed   By: Vinnie Langton M.D.   On: 07/18/2015 19:31    Microbiology: Recent Results  (from the past 240 hour(s))  MRSA PCR Screening     Status: None   Collection Time: 07/18/15 12:48 PM  Result Value Ref Range Status   MRSA by PCR NEGATIVE NEGATIVE Final    Comment:        The GeneXpert MRSA Assay (FDA approved for NASAL specimens only), is one component of a comprehensive MRSA colonization surveillance program. It is not intended to diagnose MRSA infection nor to guide or monitor treatment for MRSA infections.   Culture, blood (Routine X 2) w Reflex to ID Panel     Status: None   Collection Time: 07/20/15  7:30 AM  Result Value Ref Range Status   Specimen Description BLOOD RIGHT FOREARM  Final   Special Requests BOTTLES DRAWN AEROBIC AND ANAEROBIC 6CC  Final   Culture NO GROWTH 5 DAYS  Final   Report Status 07/25/2015 FINAL  Final  Culture, blood (Routine X 2) w Reflex to ID Panel     Status: None   Collection Time: 07/20/15  7:36 AM  Result Value Ref Range Status   Specimen Description BLOOD RIGHT HAND  Final   Special Requests BOTTLES DRAWN AEROBIC AND ANAEROBIC [facility]  Final   Culture NO GROWTH 5 DAYS  Final   Report Status 07/25/2015 FINAL  Final     Labs: Basic Metabolic Panel:  Recent Labs Lab 07/20/15 0423 07/21/15 0448 07/22/15 0447 07/23/15 0523 07/24/15 0520  NA 139 138 139 138 135  K 4.0 3.5 3.8 3.7 3.6  CL 104 100* 101 99* 99*  CO2 23 26  26 26 25   GLUCOSE 115* 97 121* 101* 93  BUN 41* 26* 41* 26* 41*  CREATININE 8.06* 5.67* 7.65* 5.32* 7.60*  CALCIUM 8.6* 8.5* 8.4* 8.4* 8.2*  PHOS 3.9 3.3 4.2 3.8  --    Liver Function Tests:  Recent Labs Lab 07/19/15 0437 07/20/15 0423 07/21/15 0448 07/22/15 0447 07/23/15 0523  AST 39  --   --   --   --   ALT 9*  --   --   --   --   ALKPHOS 59  --   --   --   --   BILITOT 1.0  --   --   --   --   PROT 6.4*  --   --   --   --   ALBUMIN 3.3* 3.2* 3.2* 2.9* 3.1*   No results for input(s): LIPASE, AMYLASE in the last 168 hours. No results for input(s): AMMONIA in the last 168  hours. CBC:  Recent Labs Lab 07/21/15 0448 07/22/15 0447 07/23/15 0523 07/24/15 0520 07/25/15 0514  WBC 13.5* 10.4 11.0* 9.0 7.9  HGB 8.1* 6.8* 9.3* 8.2* 8.8*  HCT 24.5* 20.5* 27.5* 25.0* 27.0*  MCV 96.8 98.6 95.2 94.7 95.4  PLT 141* 129* 137* 154 190   Cardiac Enzymes:  Recent Labs Lab 07/18/15 0928 07/18/15 1429  TROPONINI 0.19* 0.18*   BNP: BNP (last 3 results)  Recent Labs  01/06/15 1135  BNP 188.0*    ProBNP (last 3 results) No results for input(s): PROBNP in the last 8760 hours.  CBG: No results for input(s): GLUCAP in the last 168 hours.   Signed:  Kathie Dike, MD.  [facility] 07/25/2015, 8:30 AM  By signing my name below, I, Delene Ruffini, attest that this documentation has been prepared under the direction and in the presence of Kathie Dike, MD. Electronically Signed: Delene Ruffini 07/25/2015 12:00pm  I, Dr. Kathie Dike, personally performed the services described in this documentaiton. All medical record entries made by the scribe were at my direction and in my presence. I have reviewed the chart and agree that the record reflects my personal performance and is accurate and complete  Kathie Dike, MD, 07/25/2015 1:34 PM

## 2015-07-25 NOTE — Progress Notes (Addendum)
CM contacted [place] spoke with Seth Bake.  [place] services pending for patient just need information for processing to start services. CM faxed information to be reviewed for processing.   The DME neb machine will follow up with patient to see if he wants item delivered to hospital or home.  CM spoke with patient regarding the options for the neb machine. Informed the machine can be delivered to hospital but no specific time will be given.  You will have to wait for the company to bring.  The machine can be picked up Monday at the [facility] store or [facility] can have shipped to his home to have but will receive equipment possibly on Tuesday.  Currently, it depends on how soon the patient will be using equipment.  Patient will have the equipment delivered to hospital.  CM contacted [facility] and informed will need equipment delivered to hospital and patient d/c from hospital today.

## 2015-07-30 ENCOUNTER — Telehealth: Payer: Self-pay | Admitting: Family Medicine

## 2015-07-30 NOTE — Telephone Encounter (Signed)
Discussed with home health nurse(carol). Patient has appt scheduled 08/04/15 for follow up and Dr Nicki Reaper will discuss this with patient and family at this visit.

## 2015-07-30 NOTE — Telephone Encounter (Signed)
Home health nurse is wanting to know if an appetite stimulant can be ordered. Pt is not wanting to eat.

## 2015-07-30 NOTE — Telephone Encounter (Signed)
Pt has been in [place], not seen here for half a yr, needs f u after hosp, rec sched visit with dr Nicki Reaper next wk t o f u plus disc uss this

## 2015-08-04 ENCOUNTER — Ambulatory Visit (INDEPENDENT_AMBULATORY_CARE_PROVIDER_SITE_OTHER): Payer: Medicare Other | Admitting: Family Medicine

## 2015-08-04 ENCOUNTER — Encounter: Payer: Self-pay | Admitting: Family Medicine

## 2015-08-04 VITALS — BP 122/82 | Ht 75.0 in | Wt 158.2 lb

## 2015-08-04 DIAGNOSIS — D509 Iron deficiency anemia, unspecified: Secondary | ICD-10-CM

## 2015-08-04 DIAGNOSIS — N185 Chronic kidney disease, stage 5: Secondary | ICD-10-CM

## 2015-08-04 NOTE — Progress Notes (Signed)
   Subjective:    Patient ID: [name], male    DOB: 1945-05-03, 70 y.o.   MRN: [id number]  HPI  Patient arrives for a follow up on a recent hospitalization for kidney disease.Having stroke like sx. Patient was seen for strokelike symptoms but was diagnosed with retroperitoneal bleed which was very serious. I concerned that this could recur. Patient denies any bleeding currently denies weakness tiredness except for what comes with dialysis. Denies any chest tightness pressure pain no unilateral weakness or numbness. No rectal bleeding no hematuria. Review of Systems See above.    Objective:   Physical Exam Lungs are clear hearts regular pulse normal extremities no edema       Assessment & Plan:  Frailty-long-term prognosis guarded  End-stage renal disease will be going to [facility] dialysis over the next few months  Anemia due to retroperitoneal bleed check CBC I would recommend continuing aspirin 81 mg per day. Follow-up 3 months

## 2015-08-05 DIAGNOSIS — H5451 Low vision, right eye, normal vision left eye: Secondary | ICD-10-CM | POA: Diagnosis not present

## 2015-08-05 DIAGNOSIS — J440 Chronic obstructive pulmonary disease with acute lower respiratory infection: Secondary | ICD-10-CM | POA: Diagnosis not present

## 2015-08-05 DIAGNOSIS — I69354 Hemiplegia and hemiparesis following cerebral infarction affecting left non-dominant side: Secondary | ICD-10-CM | POA: Diagnosis not present

## 2015-08-05 DIAGNOSIS — I69398 Other sequelae of cerebral infarction: Secondary | ICD-10-CM | POA: Diagnosis not present

## 2015-08-06 LAB — CBC WITH DIFFERENTIAL/PLATELET
BASOS: 1 %
Basophils Absolute: 0 10*3/uL (ref 0.0–0.2)
EOS (ABSOLUTE): 0.2 10*3/uL (ref 0.0–0.4)
EOS: 4 %
Hematocrit: 29.6 % — ABNORMAL LOW (ref 37.5–51.0)
Hemoglobin: 9.8 g/dL — ABNORMAL LOW (ref 12.6–17.7)
IMMATURE GRANS (ABS): 0 10*3/uL (ref 0.0–0.1)
IMMATURE GRANULOCYTES: 0 %
LYMPHS: 25 %
Lymphocytes Absolute: 1.4 10*3/uL (ref 0.7–3.1)
MCH: 30.5 pg (ref 26.6–33.0)
MCHC: 33.1 g/dL (ref 31.5–35.7)
MCV: 92 fL (ref 79–97)
Monocytes Absolute: 0.4 10*3/uL (ref 0.1–0.9)
Monocytes: 7 %
NEUTROS PCT: 63 %
Neutrophils Absolute: 3.5 10*3/uL (ref 1.4–7.0)
PLATELETS: 267 10*3/uL (ref 150–379)
RBC: 3.21 x10E6/uL — ABNORMAL LOW (ref 4.14–5.80)
RDW: 15.8 % — ABNORMAL HIGH (ref 12.3–15.4)
WBC: 5.6 10*3/uL (ref 3.4–10.8)

## 2015-08-19 ENCOUNTER — Other Ambulatory Visit: Payer: Self-pay | Admitting: Cardiovascular Disease

## 2015-08-20 ENCOUNTER — Ambulatory Visit (INDEPENDENT_AMBULATORY_CARE_PROVIDER_SITE_OTHER): Payer: Medicare Other | Admitting: Cardiovascular Disease

## 2015-08-20 ENCOUNTER — Encounter: Payer: Self-pay | Admitting: Cardiovascular Disease

## 2015-08-20 VITALS — BP 138/81 | HR 65 | Ht 75.0 in | Wt 153.0 lb

## 2015-08-20 DIAGNOSIS — R0609 Other forms of dyspnea: Secondary | ICD-10-CM

## 2015-08-20 DIAGNOSIS — N186 End stage renal disease: Secondary | ICD-10-CM | POA: Diagnosis not present

## 2015-08-20 DIAGNOSIS — E785 Hyperlipidemia, unspecified: Secondary | ICD-10-CM | POA: Diagnosis not present

## 2015-08-20 DIAGNOSIS — Z87891 Personal history of nicotine dependence: Secondary | ICD-10-CM

## 2015-08-20 DIAGNOSIS — I25118 Atherosclerotic heart disease of native coronary artery with other forms of angina pectoris: Secondary | ICD-10-CM

## 2015-08-20 DIAGNOSIS — I10 Essential (primary) hypertension: Secondary | ICD-10-CM

## 2015-08-20 DIAGNOSIS — I119 Hypertensive heart disease without heart failure: Secondary | ICD-10-CM

## 2015-08-20 DIAGNOSIS — Z9289 Personal history of other medical treatment: Secondary | ICD-10-CM

## 2015-08-20 DIAGNOSIS — Z87898 Personal history of other specified conditions: Secondary | ICD-10-CM

## 2015-08-20 NOTE — Progress Notes (Signed)
Patient ID: [name], male   DOB: September 27, 1945, 70 y.o.   MRN: [id number]      SUBJECTIVE: The patient returns for follow up of CAD, chronic systolic heart failure, hyperlipidemia, and HTN. He has a history of multiple TIAs and takes aspirin 81 mg as he is deemed to be a high risk for intracranial bleeding due to prior history of hemorrhagic strokes and he is a high fall risk.  He underwent bare-metal stent placement to the LAD in July 2013. He had moderate residual RCA disease of the mid vessel 50-60% lesion at that time.  He sustained a CVA in March 2016. He began hemodialysis in May 2016.  Echocardiogram performed 07/18/15 showed normal left ventricular systolic function and regional wall motion, LVEF 0000000, grade 1 diastolic dysfunction, and moderate aortic regurgitation.  He was again hospitalized for a TIA in May. He was hyperkalemic. He had a retroperitoneal hemorrhage in April and was given 6 units of packed red blood cells. He had a minimal troponin elevation at that time deemed secondary to demand ischemia. He had aspiration pneumonia causing hypoxic respiratory failure.  He apparently stays in the bed for 2 days at a time before going to dialysis sessions. He does not get much activity. He occasionally has mild exertional dyspnea. He denies chest pain.   Review of Systems: As per "subjective", otherwise negative.  No Known Allergies  Current Outpatient Prescriptions  Medication Sig Dispense Refill  . albuterol (PROVENTIL) (2.5 MG/3ML) 0.083% nebulizer solution Take 3 mLs (2.5 mg total) by nebulization every 6 (six) hours as needed for wheezing or shortness of breath. 75 mL 12  . amLODipine (NORVASC) 5 MG tablet Take 1 tablet by mouth daily.  6  . aspirin EC 81 MG tablet Take 1 tablet (81 mg total) by mouth every evening. Increase aspirin to 325mg  daily after 2 weeks    . atorvastatin (LIPITOR) 80 MG tablet TAKE 1 TABLET (80 MG TOTAL) BY MOUTH DAILY AT 6 PM. 90 tablet 3  . b  complex-vitamin c-folic acid (NEPHRO-VITE) 0.8 MG TABS tablet Take 1 tablet by mouth daily.  11  . citalopram (CELEXA) 10 MG tablet TAKE 1 TABLET BY MOUTH EVERY DAY 30 tablet 5  . cloNIDine (CATAPRES) 0.2 MG tablet Take 1 tablet (0.2 mg total) by mouth 2 (two) times daily. (Patient taking differently: Take 0.1 mg by mouth daily. ) 60 tablet 0  . diclofenac sodium (VOLTAREN) 1 % GEL Apply 2 g topically as needed.    . ezetimibe (ZETIA) 10 MG tablet TAKE 1 TABLET (10 MG TOTAL) BY MOUTH DAILY. 30 tablet 6  . pantoprazole (PROTONIX) 40 MG tablet TAKE 1 TABLET (40 MG TOTAL) BY MOUTH DAILY. (Patient taking differently: TAKE 1 TABLET (40 MG TOTAL) BY MOUTH DAILY. - every evening) 30 tablet 11  . polyethylene glycol (MIRALAX / GLYCOLAX) packet Take 17 g by mouth daily. 14 each 0  . [DISCONTINUED] metoprolol (TOPROL-XL) 50 MG 24 hr tablet Take 50 mg by mouth daily.      . [DISCONTINUED] niacin (NIASPAN) 500 MG CR tablet Take 500 mg by mouth at bedtime.      . [DISCONTINUED] omeprazole (PRILOSEC) 20 MG capsule 1 po every morning 30 capsule 5   No current facility-administered medications for this visit.    Past Medical History  Diagnosis Date  . Hypertension   . COPD (chronic obstructive pulmonary disease) ([place])   . Hyperlipidemia   . Cardiomyopathy     EF of 30%  per echo in June of 2012; admitted with congestive heart failure; nonobstructive CAD on cath in 08/2010  . History of noncompliance with medical treatment   . Cerebrovascular disease 2011    CVA  in 02/2010-only deficit is decreased vision in  right eye  . Alcohol abuse     wife states he was never heavy drinker  . Tobacco abuse     40 pack years  . Abdominal aortic aneurysm ([place])     Stent graft repair  . Chronic systolic CHF (congestive heart failure) ([place])   . Leg pain   . CAD (coronary artery disease)   . Myocardial infarction acute ([place]) 10/01/11  . Effusion of right knee joint 12/06/2012  . Anemia   . Atrial fibrillation ([place])     . Ataxic gait   . Weakness generalized   . Frequent falls   . Forgetfulness   . Stroke [facility]) 2011    decreased vision of right eye    Most recent 05/23/14 - bleed - has a drag to his left leg  . Depression   . Anxiety     Takes Citalopram  . Arthritis     knees  . Dementia     early onset  . Enlarged prostate   . GERD (gastroesophageal reflux disease)   . Constipation   . Chronic kidney disease (CKD)     dialysis MWF at [facility] in [place]    Past Surgical History  Procedure Laterality Date  . Colonoscopy w/ polypectomy  2006    Dr. Tamala Julian: anal fissure, sessile adenomatous polyp, diverticulosis  . Esophagogastroduodenoscopy  11/15/2010    Procedure: ESOPHAGOGASTRODUODENOSCOPY (EGD);  Surgeon: Dorothyann Peng, MD;  Location: [facility];  Service: Endoscopy;  Laterality: N/A;  with propofol sedation; procedure start @ 0813  . Flexible sigmoidoscopy  11/15/2010    Procedure: FLEXIBLE SIGMOIDOSCOPY;  Surgeon: Dorothyann Peng, MD;  Location: [facility];  Service: Endoscopy;  Laterality: N/A;  with propofol sedation; ended @ 0808  . Polypectomy  12/13/2010    Procedure: POLYPECTOMY;  Surgeon: Dorothyann Peng, MD;  Location: [facility];  Service: Endoscopy;  Laterality: N/A;  right colon, cecal, transverse, and sigmoid polypectomy  . Cardiac catheterization  10/04/11    Normal left main, 95% pLAD stenosis with ulceration s/p successful BMS placement, 30% segmental plaque beyond this, dLAD after D2 with 50% plaquing, then focal 70% lesion, 80% focal short D2 stenosis, 30% pOM2 lesion, 30-40% mOM3 stenosis, Shephard's crook region of RCA with 50% lesion, mRCA 50-60% lesion and mild dRCA irregularities.   . Coronary stent placement  10/04/11  . Abdominal aortic aneurysm repair w/ endoluminal graft      01/16/2008  . Left heart catheterization with coronary angiogram N/A 10/04/2011    Procedure: LEFT HEART CATHETERIZATION WITH CORONARY ANGIOGRAM;  Surgeon: Hillary Bow, MD;  Location: [facility] CATH LAB;   Service: Cardiovascular;  Laterality: N/A;  . Av fistula placement Left 07/07/2014    Procedure: Creation of Left Arm ARTERIOVENOUS Fistula;  Surgeon: Rosetta Posner, MD;  Location: [place];  Service: Vascular;  Laterality: Left;  . Insertion of dialysis catheter Right 08/05/2014    Procedure: INSERTION OF DIALYSIS CATHETER Right internal Jugular;  Surgeon: Conrad Juli[place]own, MD;  Location: [place];  Service: Vascular;  Laterality: Right;  . Port-a-cath removal    . Peripheral vascular catheterization Left 01/15/2015    Procedure: Fistulagram;  Surgeon: Conrad Audrain, MD;  Location: [place] CV LAB;  Service: Cardiovascular;  Laterality: Left;  arm  . Peripheral vascular catheterization Left 01/15/2015    Procedure: Peripheral Vascular Intervention;  Surgeon: Conrad [place]ville, MD;  Location: [place] CV LAB;  Service: Cardiovascular;  Laterality: Left;  ARM VEINOUS PTA  . Insertion of dialysis catheter Left 02/13/2015    Procedure: INSERTION OF DIALYSIS CATHETER AND CENTRAL VENOGRAM;  Surgeon: Serafina Mitchell, MD;  Location: [place];  Service: Vascular;  Laterality: Left;  . Peripheral vascular catheterization N/A 04/07/2015    Procedure: A/V Shuntogram/Fistulagram;  Surgeon: Conrad Levy, MD;  Location: [place] CV LAB;  Service: Cardiovascular;  Laterality: N/A;  . [place]ison of arteriovenous fistula Left [id number]    Procedure: PLICATION OF LEFT ARM PSEUDOANEURYSM;  Surgeon: Conrad Sophia, MD;  Location: [place];  Service: Vascular;  Laterality: Left;  . Patch angioplasty Left 04/09/2015    Procedure: PATCH ANGIOPLASTY;  Surgeon: Conrad DISH, MD;  Location: [place];  Service: Vascular;  Laterality: Left;  . Ligation of arteriovenous  fistula Left 04/15/2015    Procedure: LIGATION OF BRACHIOCEPHALIC ARTERIOVENOUS  FISTULA;  Surgeon: Mal Misty, MD;  Location: [place];  Service: Vascular;  Laterality: Left;  . Exchange of a dialysis catheter Left 05/14/2015    Procedure: EXCHANGE OF A DIALYSIS CATHETER-LEFT  INTERNAL JUGULAR VEIN;  Surgeon: Conrad Honalo, MD;  Location: [place];  Service: Vascular;  Laterality: Left;  . Removal of a dialysis catheter Left 05/30/2015    Procedure: REMOVAL OF A DIALYSIS CATHETER LEFT INTERNAL JUGULAR;  Surgeon: Conrad McLaughlin, MD;  Location: [place];  Service: Vascular;  Laterality: Left;  . Insertion of dialysis catheter Right 05/30/2015    Procedure: INSERTION OF DIALYSIS CATHETER;  Surgeon: Conrad [place], MD;  Location: [place];  Service: Vascular;  Laterality: Right;  . Hematoma evacuation Left 05/30/2015    Procedure: EVACUATION NECK HEMATOMA;  Surgeon: Conrad Roy, MD;  Location: [place];  Service: Vascular;  Laterality: Left;  . Capd insertion Bilateral 07/16/2015    Procedure: LAPAROSCOPIC INSERTION CONTINUOUS AMBULATORY PERITONEAL DIALYSIS  (CAPD) CATHETER;  Surgeon: Michael Boston, MD;  Location: [place];  Service: General;  Laterality: Bilateral;  . Inguinal hernia repair Right 07/16/2015    Procedure: LAPAROSCOPIC RIGHT INGUINAL HERNIA WITH MESH;  Surgeon: Michael Boston, MD;  Location: [place];  Service: General;  Laterality: Right;    Social History   Social History  . Marital Status: Married    Spouse Name: N/A  . Number of Children: N/A  . Years of Education: N/A   Occupational History  . retired     [employer]   Social History Main Topics  . Smoking status: Former Smoker -- 0.50 packs/day for 50 years    Types: Cigarettes    Start date: 06/25/1957    Quit date: 03/04/2013  . Smokeless tobacco: Never Used  . Alcohol Use: No     Comment: None for several years  . Drug Use: No  . Sexual Activity: No   Other Topics Concern  . Not on file   Social History Narrative   Married, retired from [employer], 6 children   Right handed   Caffeine use - none     Filed Vitals:   08/20/15 0946  BP: 138/81  Pulse: 65  Height: 6\' 3"  (1.905 m)  Weight: 153 lb (69.4 kg)    PHYSICAL EXAM General: NAD HEENT: Normal. Neck: No JVD, no thyromegaly. Lungs:  Diminished throughout, no rales or wheezes. CV: Distant  sounds. Regular rate and rhythm, normal S1/S2, no S3/S4, no murmur. No pretibial or periankle edema.     Abdomen: Soft, nontender, no distention.  Neurologic: Alert and oriented.  Psych: Normal affect. Skin: Normal. Musculoskeletal: No gross deformities.    ECG: Most recent ECG reviewed.      ASSESSMENT AND PLAN: 1. CAD: Stable ischemic heart disease with mild exertional dypsnea. Continue aspirin and Lipitor.   2. Malignant HTN: Controlled. No changes.  3. Chronic systolic heart failure: Euvolemic and stable. LVEF is now normal as noted above.  4. Hyperlipidemia: Lipids on 09/18/14 showed total cholesterol 133, triglycerides 50, HDL 53, LDL 70. Continue current therapy with Lipitor 80 mg and Zetia 10 mg.  5. ESRD on hemodialysis.   Dispo: f/u 6 months.  Time spent: 40 minutes, of which greater than 50% was spent reviewing symptoms, relevant blood tests and studies, and discussing management plan with the patient.   Kate Sable, M.D., F.A.C.C.

## 2015-08-20 NOTE — Patient Instructions (Signed)
Your physician wants you to follow-up in: 6 months with Dr. Koneswaran. You will receive a reminder letter in the mail two months in advance. If you don't receive a letter, please call our office to schedule the follow-up appointment.  Your physician recommends that you continue on your current medications as directed. Please refer to the Current Medication list given to you today.  Thank you for choosing [facility]!   

## 2015-09-03 ENCOUNTER — Ambulatory Visit: Payer: Medicare Other | Admitting: Family Medicine

## 2015-09-17 ENCOUNTER — Other Ambulatory Visit: Payer: Self-pay | Admitting: Family Medicine

## 2015-09-23 ENCOUNTER — Other Ambulatory Visit: Payer: Self-pay | Admitting: *Deleted

## 2015-09-28 ENCOUNTER — Ambulatory Visit (HOSPITAL_COMMUNITY)
Admission: RE | Admit: 2015-09-28 | Discharge: 2015-09-28 | Disposition: A | Discharge status: Home / Self Care | Payer: Medicare Other | Source: Ambulatory Visit | Attending: Vascular Surgery | Admitting: Vascular Surgery

## 2015-09-28 DIAGNOSIS — I2510 Atherosclerotic heart disease of native coronary artery without angina pectoris: Secondary | ICD-10-CM | POA: Insufficient documentation

## 2015-09-28 DIAGNOSIS — Z87891 Personal history of nicotine dependence: Secondary | ICD-10-CM | POA: Diagnosis not present

## 2015-09-28 DIAGNOSIS — F0390 Unspecified dementia without behavioral disturbance: Secondary | ICD-10-CM | POA: Diagnosis not present

## 2015-09-28 DIAGNOSIS — I4891 Unspecified atrial fibrillation: Secondary | ICD-10-CM | POA: Diagnosis not present

## 2015-09-28 DIAGNOSIS — Z992 Dependence on renal dialysis: Secondary | ICD-10-CM | POA: Diagnosis not present

## 2015-09-28 DIAGNOSIS — E785 Hyperlipidemia, unspecified: Secondary | ICD-10-CM | POA: Diagnosis not present

## 2015-09-28 DIAGNOSIS — J449 Chronic obstructive pulmonary disease, unspecified: Secondary | ICD-10-CM | POA: Diagnosis not present

## 2015-09-28 DIAGNOSIS — N189 Chronic kidney disease, unspecified: Secondary | ICD-10-CM | POA: Insufficient documentation

## 2015-09-28 DIAGNOSIS — F329 Major depressive disorder, single episode, unspecified: Secondary | ICD-10-CM | POA: Insufficient documentation

## 2015-09-28 DIAGNOSIS — F419 Anxiety disorder, unspecified: Secondary | ICD-10-CM | POA: Diagnosis not present

## 2015-09-28 DIAGNOSIS — Z7982 Long term (current) use of aspirin: Secondary | ICD-10-CM | POA: Diagnosis not present

## 2015-09-28 DIAGNOSIS — I69398 Other sequelae of cerebral infarction: Secondary | ICD-10-CM | POA: Diagnosis not present

## 2015-09-28 DIAGNOSIS — Z79899 Other long term (current) drug therapy: Secondary | ICD-10-CM | POA: Diagnosis not present

## 2015-09-28 DIAGNOSIS — K219 Gastro-esophageal reflux disease without esophagitis: Secondary | ICD-10-CM | POA: Insufficient documentation

## 2015-09-28 DIAGNOSIS — H539 Unspecified visual disturbance: Secondary | ICD-10-CM | POA: Insufficient documentation

## 2015-09-28 DIAGNOSIS — N186 End stage renal disease: Secondary | ICD-10-CM | POA: Diagnosis not present

## 2015-09-28 DIAGNOSIS — I252 Old myocardial infarction: Secondary | ICD-10-CM | POA: Diagnosis not present

## 2015-09-28 DIAGNOSIS — Z452 Encounter for adjustment and management of vascular access device: Secondary | ICD-10-CM | POA: Insufficient documentation

## 2015-09-28 DIAGNOSIS — I129 Hypertensive chronic kidney disease with stage 1 through stage 4 chronic kidney disease, or unspecified chronic kidney disease: Secondary | ICD-10-CM | POA: Insufficient documentation

## 2015-09-28 MED ORDER — LIDOCAINE HCL (PF) 2 % IJ SOLN
INTRAMUSCULAR | Status: AC
  Filled 2015-09-28: qty 10

## 2015-09-28 NOTE — Progress Notes (Signed)
  [facility] Catheter Removal Procedure Note  Diagnosis: ESRD  Plan:  Remove right femoral catheter  Consent signed:  Yes.   Time out completed:  Yes.   Coumadin:  No. PT/INR (if applicable):   Other labs:  Procedure: 1.  Sterile prepping and draping over catheter area 2. 10 ml 2% lidocaine plain instilled at removal site. 3.  right catheter removed in its entirety with cuff in tact. 4.  Complications:  none 5. Tip of catheter sent for culture:  No.   Patient tolerated procedure well:  Yes.   Pressure held, no bleeding noted, dressing applied Instructions given to the pt regarding wound care and bleeding.   Virgina Jock, PA-C 09/28/2015 10:47 AM

## 2015-09-28 NOTE — Progress Notes (Signed)
[facility] SHORT STAY H&P  CC:  Catheter removal   HPI:  This is a 70 y.o. male here for right femoral catheter removal.  He is currently dialyzing via peritoneal catheter.    Past Medical History  Diagnosis Date  . Hypertension   . COPD (chronic obstructive pulmonary disease) ([place])   . Hyperlipidemia   . Cardiomyopathy     EF of 30% per echo in June of 2012; admitted with congestive heart failure; nonobstructive CAD on cath in 08/2010  . History of noncompliance with medical treatment   . Cerebrovascular disease 2011    CVA  in 02/2010-only deficit is decreased vision in  right eye  . Alcohol abuse     wife states he was never heavy drinker  . Tobacco abuse     40 pack years  . Abdominal aortic aneurysm ([place])     Stent graft repair  . Chronic systolic CHF (congestive heart failure) ([place])   . Leg pain   . CAD (coronary artery disease)   . Myocardial infarction acute ([place]) 10/01/11  . Effusion of right knee joint 12/06/2012  . Anemia   . Atrial fibrillation ([place])   . Ataxic gait   . Weakness generalized   . Frequent falls   . Forgetfulness   . Stroke [facility]) 2011    decreased vision of right eye    Most recent 05/23/14 - bleed - has a drag to his left leg  . Depression   . Anxiety     Takes Citalopram  . Arthritis     knees  . Dementia     early onset  . Enlarged prostate   . GERD (gastroesophageal reflux disease)   . Constipation   . Chronic kidney disease (CKD)     dialysis MWF at [facility] in [place]    FH:  Non-Contributory  Social History   Social History  . Marital Status: Married    Spouse Name: N/A  . Number of Children: N/A  . Years of Education: N/A   Occupational History  . retired     [employer]   Social History Main Topics  . Smoking status: Former Smoker -- 0.50 packs/day for 50 years    Types: Cigarettes    Start date: 06/25/1957    Quit date: 03/04/2013  . Smokeless tobacco: Never Used  . Alcohol Use: No   Comment: None for several years  . Drug Use: No  . Sexual Activity: No   Other Topics Concern  . Not on file   Social History Narrative   Married, retired from [employer], 6 children   Right handed   Caffeine use - none    No Known Allergies  Current Outpatient Prescriptions  Medication Sig Dispense Refill  . albuterol (PROVENTIL) (2.5 MG/3ML) 0.083% nebulizer solution Take 3 mLs (2.5 mg total) by nebulization every 6 (six) hours as needed for wheezing or shortness of breath. 75 mL 12  . amLODipine (NORVASC) 5 MG tablet Take 1 tablet by mouth daily.  6  . aspirin EC 81 MG tablet Take 1 tablet (81 mg total) by mouth every evening. Increase aspirin to 325mg  daily after 2 weeks    . atorvastatin (LIPITOR) 80 MG tablet TAKE 1 TABLET (80 MG TOTAL) BY MOUTH DAILY AT 6 PM. 90 tablet 3  . b complex-vitamin c-folic acid (NEPHRO-VITE) 0.8 MG TABS tablet Take 1 tablet by mouth daily.  11  . citalopram (CELEXA) 10 MG tablet TAKE 1 TABLET  BY MOUTH EVERY DAY 30 tablet 5  . cloNIDine (CATAPRES) 0.1 MG tablet TAKE 1 TABLET BY MOUTH TWICE A DAY 60 tablet 0  . cloNIDine (CATAPRES) 0.2 MG tablet Take 1 tablet (0.2 mg total) by mouth 2 (two) times daily. (Patient taking differently: Take 0.1 mg by mouth daily. ) 60 tablet 0  . diclofenac sodium (VOLTAREN) 1 % GEL Apply 2 g topically as needed.    . ezetimibe (ZETIA) 10 MG tablet TAKE 1 TABLET (10 MG TOTAL) BY MOUTH DAILY. 30 tablet 6  . pantoprazole (PROTONIX) 40 MG tablet TAKE 1 TABLET (40 MG TOTAL) BY MOUTH DAILY. (Patient taking differently: TAKE 1 TABLET (40 MG TOTAL) BY MOUTH DAILY. - every evening) 30 tablet 11  . polyethylene glycol (MIRALAX / GLYCOLAX) packet Take 17 g by mouth daily. 14 each 0  . [DISCONTINUED] metoprolol (TOPROL-XL) 50 MG 24 hr tablet Take 50 mg by mouth daily.      . [DISCONTINUED] niacin (NIASPAN) 500 MG CR tablet Take 500 mg by mouth at bedtime.      . [DISCONTINUED] omeprazole (PRILOSEC) 20 MG capsule 1 po every morning  30 capsule 5   Current Facility-Administered Medications  Medication Dose Route Frequency Provider Last Rate Last Dose  . lidocaine (XYLOCAINE) 2 % injection             ROS:  See HPI  PHYSICAL EXAM  Filed Vitals:   09/28/15 1004  BP: 174/75  Pulse: 51  Temp: 98 F (36.7 C)  Resp: 20    Gen:  Well developed well nourished HEENT:  normocephalic Lungs:  Non-labored Abdomen:  PD catheter Skin:  No obvious rashes Neuro:  No focal deficits   Impression: This is a 70 y.o. male here for right femoral catheter removal  Plan:  Removal of right femoral catheter  Virgina Jock, PA-C [facility] [phone] 09/28/2015 10:44 AM

## 2015-10-26 ENCOUNTER — Other Ambulatory Visit: Payer: Self-pay | Admitting: Family Medicine

## 2015-11-04 ENCOUNTER — Ambulatory Visit: Payer: Medicare Other | Admitting: Family Medicine

## 2015-11-05 ENCOUNTER — Encounter: Payer: Self-pay | Admitting: Family Medicine

## 2015-11-05 ENCOUNTER — Ambulatory Visit (INDEPENDENT_AMBULATORY_CARE_PROVIDER_SITE_OTHER): Payer: Medicare Other | Admitting: Family Medicine

## 2015-11-05 VITALS — BP 138/78 | Ht 75.0 in | Wt 163.0 lb

## 2015-11-05 DIAGNOSIS — J432 Centrilobular emphysema: Secondary | ICD-10-CM | POA: Diagnosis not present

## 2015-11-05 DIAGNOSIS — Z992 Dependence on renal dialysis: Secondary | ICD-10-CM | POA: Diagnosis not present

## 2015-11-05 DIAGNOSIS — R413 Other amnesia: Secondary | ICD-10-CM

## 2015-11-05 DIAGNOSIS — I10 Essential (primary) hypertension: Secondary | ICD-10-CM | POA: Diagnosis not present

## 2015-11-05 DIAGNOSIS — R453 Demoralization and apathy: Secondary | ICD-10-CM | POA: Diagnosis not present

## 2015-11-05 DIAGNOSIS — M25571 Pain in right ankle and joints of right foot: Secondary | ICD-10-CM | POA: Diagnosis not present

## 2015-11-05 DIAGNOSIS — I679 Cerebrovascular disease, unspecified: Secondary | ICD-10-CM

## 2015-11-05 NOTE — Progress Notes (Signed)
   Subjective:    Patient ID: [name], male    DOB: 1946/02/13, 70 y.o.   MRN: [id number]  Hyperlipidemia  This is a chronic problem. The current episode started more than 1 year ago. Compliance problems include adherence to exercise.   The wife is helping him do peritoneal dialysis at home. He seems to be doing well with this. They're not having any complications but it does take a fair amount of time He takes his blood pressure medicine she checks his blood pressure on a regular basis states his been doing good He has a history of cerebrovascular disease in she is concerned that him dragging the right foot could be a sign of a stroke He does have emphysema with long history of smoking abuse He also has memory dysfunction related into his cerebrovascular disease Recently he has not been interested in doing much of anything  Dragging right leg and foot. Wife noticied it yesterday. Some pain in right foot.     Review of Systems He denies sweats and chills he states his appetite overall good denies headache no nausea vomiting states his right ankle sometimes hurts him.    Objective:   Physical Exam Neck no masses lungs are clear no crackles heart regular abdomen soft extremities no edema skin warm dry right ankle arthritis Patient walks with his walker no significant ataxia can turn well Patient walks slow mainly because of arthritis and right ankle.       Assessment & Plan:  1. Peritoneal dialysis status [facility]) Wife is doing well with these they monitor it through the dialysis center she will have a copy of the lab work sent to [place] she was warned that if there is any fevers immediately go to ER  2. Essential hypertension Blood pressures been under good control she monitors disease on a regular basis continue current treatment  3. Cerebrovascular disease I don't find any evidence of stroke on today's exam I think this is mainly because he has arthritis in his ankle that is not  walking real well I see no evidence of stroke currently  4. Centrilobular emphysema (HCC) COPD-avoid all smoking. Follow-up if any wheezing difficulty breathing flu vaccine this fall  5. Memory loss Memory loss related to his cerebrovascular disease I do not feel that this is Alzheimer's. Seems to be stable currently  6. Apathy Apathy related to do cerebrovascular disease I encouraged wife to encourage patient to stay is physically active and engaged mentally as possible  7. Right ankle pain Right ankle pain and discomfort no need for any x-rays Tylenol for pain follow-up if problems  25 minutes was spent with the patient. Greater than half the time was spent in discussion and answering questions and counseling regarding the issues that the patient came in for today.

## 2015-11-24 ENCOUNTER — Other Ambulatory Visit: Payer: Self-pay | Admitting: Family Medicine

## 2015-12-09 ENCOUNTER — Other Ambulatory Visit: Payer: Self-pay | Admitting: Family Medicine

## 2016-01-10 ENCOUNTER — Other Ambulatory Visit: Payer: Self-pay | Admitting: Cardiovascular Disease

## 2016-01-14 ENCOUNTER — Ambulatory Visit (INDEPENDENT_AMBULATORY_CARE_PROVIDER_SITE_OTHER): Payer: Medicare Other | Admitting: Neurology

## 2016-01-14 ENCOUNTER — Encounter: Payer: Self-pay | Admitting: Neurology

## 2016-01-14 VITALS — BP 135/83 | HR 67 | Wt 164.2 lb

## 2016-01-14 DIAGNOSIS — I6990 Unspecified sequelae of unspecified cerebrovascular disease: Secondary | ICD-10-CM

## 2016-01-14 NOTE — Progress Notes (Signed)
STROKE NEUROLOGY FOLLOW UP NOTE  NAME: [name] DOB: 1945-07-31  REASON FOR VISIT: stroke follow up HISTORY FROM: chart and wife  Today we had the pleasure of seeing [name] in follow-up at our Neurology Clinic. Pt was accompanied by wife.   History Summary [name] is a 70 y.o. male hx of prior CVA, multiple TIAs, A. Fib on ASA 81, HTN, HLD, cardiomyopathy, CKD, CAD, AAA s/p repair was admitted to [facility] on 05/23/14 for acute onset of dizziness and generalized weakness that subsequently resolved. Head CT and MRI completed shows questionable small acute right IC ICH vs. Hemorrhagic infarct.His ASA 81mg  at home was discontinued due to bleeding. His blood pressure was tightly controlled. He was in the intensive care for 2 days and remained neurologically stable. He had no focal deficits but had mild gait imbalance. He was seen by PT/OT and considered stable for discharge home. He was advised to be compliant with his BP meds and keep follow-up with primary care physician.   [name] 07/21/2014 visit: During the interval time, the patient has been doing well. He has PT/OT twice a week. He has been back on ASA 81mg . However, his BP still not in good control today 174/64. Wife said his Bp at home also 154-176/70-80. He has been on amlodipine, coreg, clonidine and hydralazine but still difficult in control. His CKD gradually getting worse and recently got fistular done by [name] for preparation of HD in the future. Wife said he lack of motivation, he not moving much at home and not doing exercise. He went to rehab but came back directly go to bed.   As per wife, he has been followed up with [name] in 2007 or 2008 for his previous stroke. Since then he had numerous TIAs but did not keep following up with [name]. He was seen by [name] again this March in [place] and was asked to follow up with [name] in clinic 2 months after discharge.    Update 01/13/2015 : He returns for  follow-up today accompanied by his daughter. He continues to do well without recurrent stroke or TIA symptoms. He is tolerating aspirin well without bleeding or bruising. He continues dialysis 3 days a week. States his blood pressure is well controlled and today it is 128/76. He had lipid profile checked a few months ago and was fine. He is living at home with his wife is independent in activities of daily living. He walks with a wheeled walker and states his balance is poor though he has not had any falls. He has not is mild memory difficulties since her stroke but these are not progressive. He has no new complaints. Update 01/14/2016 ;  He returns for follow-up after last visit a year ago. He is accompanied by his wife and granddaughter. Patient continues to do well and has not had recurrent stroke or TIA symptoms. He remains on aspirin 81 mg which is tolerating well with only minor bruising and no bleeding episodes. Continues to have balance difficulties but does use a wheeled walker at most times. His stumbled several times but has not had a major fall or injury. He continues to have mild memory difficulties and occasional spells of forgetfulness and disorientation but he has good days and bad days and overall is no significant worsening of his cognition as per family. Patient is now currently on home peritoneal dialysis for the last 1 year and is doing well. His  blood pressure is usually well controlled today it is 135/83. He remains on Lipitor which is tolerating well without muscle aches or pains. REVIEW OF SYSTEMS: Full 14 system review of systems performed and notable only for those listed below and in HPI above, all others are negative:  Blurred vision, shortness of breath, activity change, trouble swallowing, daytime sleepiness, walking difficulty, testicular pain, anemia, memory loss, dizziness, weakness, depression The following represents the patient's updated allergies and side effects list: No  Known Allergies  The neurologically relevant items on the patient's problem list were reviewed on today's visit.  Neurologic Examination  A problem focused neurological exam (12 or more points of the single system neurologic examination, vital signs counts as 1 point, cranial nerves count for 8 points) was performed.  Blood pressure 135/83, pulse 67, weight 164 lb 3.2 oz (74.5 kg).  General - frail elderly African-American male, in no apparent distress.  Ophthalmologic - fundi not visualized    Cardiovascular - Regular rate and rhythm, no irregular heart beat.  Mental Status -  Level of arousal and orientation to time, place, and person were intact. Language including expression, naming, repetition, comprehension was assessed and found intact. Fund of Knowledge was assessed and was impaired   Mini-Mental status exam not done but has deficits in  attention, calculation and recall.   Cranial Nerves II - XII - II - Visual field intact OU. III, IV, VI - Extraocular movements intact. V - Facial sensation intact bilaterally. VII - Facial movement intact bilaterally. VIII - Hearing & vestibular intact bilaterally. X - Palate elevates symmetrically. XI - Chin turning & shoulder shrug intact bilaterally. XII - Tongue protrusion intact.  Motor Strength - The patient's strength was normal in all extremities and pronator drift was absent.  Bulk was normal and fasciculations were absent.   Motor Tone - Muscle tone was assessed at the neck and appendages and was normal.  Reflexes - The patient's reflexes were 1+ in all extremities and he had no pathological reflexes.  Sensory - Light touch, temperature/pinprick were assessed and were normal.    Coordination - The patient had normal movements in the hands with no ataxia or dysmetria.  Tremor was absent.  Gait and Station - walk with wheeled walker, slow and small stride, slow turns, but steady.  Data reviewed: I personally reviewed the  images and agree with the radiology interpretations.  Dg Chest 2 View 05/23/2014  Severe emphysema without acute cardiopulmonary disease.   Ct Head Wo Contrast 05/23/2014  4 mm focus of acute hemorrhage posterior limb internal capsule on the right as suggested by MRI performed earlier today.   05/25/14 1. Slight interval decrease in size of subcentimeter focal hemorrhage involving the posterior limb of the right internal capsule, now 3 mm. 2. No new intracranial abnormality.  Mr Brain Wo Contrast  05/23/2014  Cannot exclude acute RIGHT posterior limb internal capsule/anterior thalamic hemorrhage, with or without associated acute ischemia. CT scan without contrast recommended. See discussion above. Possible acute RIGHT internal capsule infarct more inferiorly as seen best on coronal diffusion sequence. Correlate clinically for LEFT-sided weakness. Advanced atrophy and chronic microvascular ischemic change, with numerous microbleeds which have developed since previous brain MR in 2014, most of which however are likely chronic. Widespread areas of chronic lacunar infarction and BILATERAL cerebellar hemispheric infarction.   US Abdomen Complete 05/23/2014  1. Cholelithiasis without evidence of cholecystitis.  2. Chronic kidney disease with asymmetric atrophy of the right kidney.  3. No acute  findings identified.   CUS - Bilateral: 1-39% ICA stenosis. Vertebral artery flow is antegrade.  2D echo - - Left ventricle: The cavity size was normal. There was moderate concentric hypertrophy. Systolic function was normal. The estimated ejection fraction was in the range of 50% to 55%. Wall motion was normal; there were no regional wall motion abnormalities. Doppler parameters are consistent with abnormal left ventricular relaxation (grade 1 diastolic dysfunction). Doppler parameters are consistent with high ventricular filling pressure. - Ventricular septum: Septal  motion showed abnormal function and dyssynergy. These changes are consistent with intraventricular conduction delay. - Aortic valve: Mildly thickened, mildly calcified leaflets. There was moderate regurgitation. - Mitral valve: There was trivial regurgitation. - Left atrium: The atrium was moderately dilated. Volume/bsa, ES (1-plane Simpson&'s, A4C): 37.1 ml/m^2. - Right atrium: The atrium was mildly dilated. - Tricuspid valve: There was trivial regurgitation.  Assessment:    70 y.o. African American male with PMH of prior CVA, multiple TIAs, A. Fib on ASA 81, HTN, HLD, cardiomyopathy, CKD, CAD, AAA s/p repair was admitted to [facility] on 05/23/14 for questionable small acute right IC ICH vs. Hemorrhagic infarct. Mild cognitive impairment post stroke appears stable Plan:  I had a long d/w patient  And wife about his remote stroke,atrial fibrillation, fall risk  risk for recurrent stroke/TIAs, personally independently reviewed imaging studies and stroke evaluation results and answered questions.Continue aspirin 81 mg daily  for secondary stroke prevention and maintain strict control of hypertension with blood pressure goal below 130/90, diabetes with hemoglobin A1c goal below 6.5% and lipids with LDL cholesterol goal below 70 mg/dL. I also advised the patient to eat a healthy diet with plenty of whole grains, cereals, fruits and vegetables, exercise regularly and maintain ideal body weight .he was advised to use his wheeled walker at all times to avoid falls and injuries. Greater than 50% time during this 25 minute visit was spent on counseling and coordination of care Followup in the future with me in  future only as necessary and no routine schedule appointment was made   No orders of the defined types were placed in this encounter.   No orders of the defined types were placed in this encounter.      Antony Contras, MD [facility] [address] [place], [place]  [address] [phone]

## 2016-02-05 ENCOUNTER — Ambulatory Visit (INDEPENDENT_AMBULATORY_CARE_PROVIDER_SITE_OTHER): Payer: Medicare Other | Admitting: Family Medicine

## 2016-02-05 ENCOUNTER — Encounter: Payer: Self-pay | Admitting: Family Medicine

## 2016-02-05 VITALS — BP 112/74 | Ht 75.0 in | Wt 166.0 lb

## 2016-02-05 DIAGNOSIS — I10 Essential (primary) hypertension: Secondary | ICD-10-CM | POA: Diagnosis not present

## 2016-02-05 DIAGNOSIS — D696 Thrombocytopenia, unspecified: Secondary | ICD-10-CM

## 2016-02-05 DIAGNOSIS — F0150 Vascular dementia without behavioral disturbance: Secondary | ICD-10-CM | POA: Diagnosis not present

## 2016-02-05 DIAGNOSIS — N186 End stage renal disease: Secondary | ICD-10-CM | POA: Diagnosis not present

## 2016-02-05 NOTE — Progress Notes (Signed)
   Subjective:    Patient ID: [name], male    DOB: [birth year], 70 y.o.   MRN: [id number]  Hypertension  This is a chronic problem. The current episode started more than 1 year ago. The problem has been gradually improving since onset. There are no associated agents to hypertension. There are no known risk factors for coronary artery disease. Treatments tried: amlodipine. The current treatment provides moderate improvement. There are no compliance problems.    Patient has issues with bleeding from his skin. Wife states that patient has blood in his underwear in the mornings upon getting up. Not a heavy amount at all. Only happened twice this week so far.  Patient complains of tenderness all over also.  Patient denies fever chills sweats denies wheezing difficulty breathing. Does have some easy bruising and easy bleeding. Also suffers with dry skin. In addition to this is on a 81 mg aspirin daily try to prevent stroke Patient had flu shot at dialysis.      Review of Systems Denies chest pain shortness breath nausea vomiting diarrhea. States energy level subpar appetite low. Denies night sweats chills. Is on dialysis.    Objective:   Physical Exam  Lungs are clear hearts regular blood pressure is good abdomen is soft extremities dry skin no edema no apparent bleeding currently no bruising noted except for a couple areas on his spine patient has lost muscle mass over the past year.      Assessment & Plan:  Bleeding issues probably due to thrombocytopenia we will go ahead and check some lab tests on him. HTN decent control continue current measures Vascular dementia is stable In stage renal disease continue dialysis 25 minutes spent with family discussing multiple different issues follow-up in the spring

## 2016-02-06 LAB — CBC WITH DIFFERENTIAL/PLATELET
BASOS: 1 %
Basophils Absolute: 0 10*3/uL (ref 0.0–0.2)
EOS (ABSOLUTE): 0.4 10*3/uL (ref 0.0–0.4)
Eos: 5 %
Hematocrit: 33.7 % — ABNORMAL LOW (ref 37.5–51.0)
Hemoglobin: 12 g/dL — ABNORMAL LOW (ref 12.6–17.7)
Immature Grans (Abs): 0.1 10*3/uL (ref 0.0–0.1)
Immature Granulocytes: 1 %
LYMPHS ABS: 2.1 10*3/uL (ref 0.7–3.1)
Lymphs: 30 %
MCH: 33.1 pg — AB (ref 26.6–33.0)
MCHC: 35.6 g/dL (ref 31.5–35.7)
MCV: 93 fL (ref 79–97)
MONOS ABS: 0.6 10*3/uL (ref 0.1–0.9)
Monocytes: 9 %
NEUTROS ABS: 3.7 10*3/uL (ref 1.4–7.0)
Neutrophils: 54 %
PLATELETS: 159 10*3/uL (ref 150–379)
RBC: 3.63 x10E6/uL — ABNORMAL LOW (ref 4.14–5.80)
RDW: 14.9 % (ref 12.3–15.4)
WBC: 6.8 10*3/uL (ref 3.4–10.8)

## 2016-02-06 LAB — PT AND PTT
INR: 1.1 (ref 0.8–1.2)
Prothrombin Time: 12 s (ref 9.1–12.0)
aPTT: 27 s (ref 24–33)

## 2016-02-22 ENCOUNTER — Encounter: Payer: Self-pay | Admitting: Cardiovascular Disease

## 2016-02-22 ENCOUNTER — Ambulatory Visit (INDEPENDENT_AMBULATORY_CARE_PROVIDER_SITE_OTHER): Payer: Medicare Other | Admitting: Cardiovascular Disease

## 2016-02-22 VITALS — BP 121/80 | HR 66 | Ht 75.0 in | Wt 164.4 lb

## 2016-02-22 DIAGNOSIS — Z95828 Presence of other vascular implants and grafts: Secondary | ICD-10-CM | POA: Diagnosis not present

## 2016-02-22 DIAGNOSIS — Z992 Dependence on renal dialysis: Secondary | ICD-10-CM

## 2016-02-22 DIAGNOSIS — I25110 Atherosclerotic heart disease of native coronary artery with unstable angina pectoris: Secondary | ICD-10-CM

## 2016-02-22 DIAGNOSIS — E7800 Pure hypercholesterolemia, unspecified: Secondary | ICD-10-CM

## 2016-02-22 DIAGNOSIS — N186 End stage renal disease: Secondary | ICD-10-CM

## 2016-02-22 DIAGNOSIS — I10 Essential (primary) hypertension: Secondary | ICD-10-CM

## 2016-02-22 DIAGNOSIS — I119 Hypertensive heart disease without heart failure: Secondary | ICD-10-CM

## 2016-02-22 NOTE — Patient Instructions (Signed)

## 2016-02-22 NOTE — Progress Notes (Signed)
SUBJECTIVE: The patient returns for follow up of CAD, chronic systolic heart failure, hyperlipidemia, and HTN. He has a history of multiple TIAs and takes aspirin 81 mg as he is deemed to be a high risk for intracranial bleeding due to prior history of hemorrhagic strokes and he is a high fall risk.  He underwent bare-metal stent placement to the LAD in July 2013. He had moderate residual RCA disease of the mid vessel 50-60% lesion at that time.  He sustained a CVA in March 2016. He began hemodialysis in May 2016.  Echocardiogram performed 07/18/15 showed normal left ventricular systolic function and regional wall motion, LVEF 0000000, grade 1 diastolic dysfunction, and moderate aortic regurgitation.  He was again hospitalized for a TIA in May 2017. He was hyperkalemic. He had a retroperitoneal hemorrhage in April and was given 6 units of packed red blood cells. He had a minimal troponin elevation at that time deemed secondary to demand ischemia. He had aspiration pneumonia causing hypoxic respiratory failure.  He does not get much activity. He occasionally has mild exertional dyspnea. He denies chest pain, palpitations, syncope, and PND.   Review of Systems: As per "subjective", otherwise negative.  No Known Allergies  Current Outpatient Prescriptions  Medication Sig Dispense Refill  . albuterol (PROVENTIL) (2.5 MG/3ML) 0.083% nebulizer solution Take 3 mLs (2.5 mg total) by nebulization every 6 (six) hours as needed for wheezing or shortness of breath. 75 mL 12  . amLODipine (NORVASC) 10 MG tablet Take 10 mg by mouth daily.  5  . aspirin EC 81 MG tablet Take 1 tablet (81 mg total) by mouth every evening. Increase aspirin to 325mg  daily after 2 weeks    . atorvastatin (LIPITOR) 80 MG tablet TAKE 1 TABLET (80 MG TOTAL) BY MOUTH DAILY AT 6 PM. 90 tablet 3  . b complex-vitamin c-folic acid (NEPHRO-VITE) 0.8 MG TABS tablet Take 1 tablet by mouth daily.  11  . calcitRIOL (ROCALTROL)  0.25 MCG capsule Take 0.25 mcg by mouth 2 (two) times daily.     . citalopram (CELEXA) 10 MG tablet TAKE 1 TABLET BY MOUTH EVERY DAY 30 tablet 3  . cloNIDine (CATAPRES - DOSED IN MG/24 HR) 0.1 mg/24hr patch Place 0.1 mg onto the skin once a week.   4  . cloNIDine (CATAPRES - DOSED IN MG/24 HR) 0.1 mg/24hr patch Place 0.1 mg onto the skin once a week.    . cloNIDine (CATAPRES) 0.1 MG tablet Take 0.1 mg by mouth 2 (two) times daily.  5  . diclofenac sodium (VOLTAREN) 1 % GEL Apply 2 g topically as needed.    . ezetimibe (ZETIA) 10 MG tablet TAKE 1 TABLET (10 MG TOTAL) BY MOUTH DAILY. 30 tablet 6  . gentamicin cream (GARAMYCIN) 0.1 % APPLY ONCE DAILY BEFORE DRESSING  5  . KLOR-CON 10 10 MEQ tablet Take 10 mEq by mouth daily.   4  . pantoprazole (PROTONIX) 40 MG tablet TAKE 1 TABLET (40 MG TOTAL) BY MOUTH DAILY. (Patient taking differently: TAKE 1 TABLET (40 MG TOTAL) BY MOUTH DAILY. - every evening) 30 tablet 11  . polyethylene glycol (MIRALAX / GLYCOLAX) packet Take 17 g by mouth daily. 14 each 0   No current facility-administered medications for this visit.     Past Medical History:  Diagnosis Date  . Abdominal aortic aneurysm ([place])    Stent graft repair  . Alcohol abuse    wife states he was never heavy drinker  .  Anemia   . Anxiety    Takes Citalopram  . Arthritis    knees  . Ataxic gait   . Atrial fibrillation ([place])   . CAD (coronary artery disease)   . Cardiomyopathy    EF of 30% per echo in June of 2012; admitted with congestive heart failure; nonobstructive CAD on cath in 08/2010  . Cerebrovascular disease 2011   CVA  in 02/2010-only deficit is decreased vision in  right eye  . Chronic kidney disease (CKD)    dialysis MWF at [facility] in [place]  . Chronic systolic CHF (congestive heart failure) ([place])   . Constipation   . COPD (chronic obstructive pulmonary disease) ([place])   . Dementia    early onset  . Depression   . Effusion of right knee joint 12/06/2012  . Enlarged  prostate   . Forgetfulness   . Frequent falls   . GERD (gastroesophageal reflux disease)   . History of noncompliance with medical treatment   . Hyperlipidemia   . Hypertension   . Leg pain   . Myocardial infarction acute 10/01/11  . Stroke [facility]) 2011   decreased vision of right eye    Most recent 05/23/14 - bleed - has a drag to his left leg  . Tobacco abuse    40 pack years  . Weakness generalized     Past Surgical History:  Procedure Laterality Date  . ABDOMINAL AORTIC ANEURYSM REPAIR W/ ENDOLUMINAL GRAFT     01/16/2008  . AV FISTULA PLACEMENT Left 07/07/2014   Procedure: Creation of Left Arm ARTERIOVENOUS Fistula;  Surgeon: Rosetta Posner, MD;  Location: [place];  Service: Vascular;  Laterality: Left;  . CAPD INSERTION Bilateral 07/16/2015   Procedure: LAPAROSCOPIC INSERTION CONTINUOUS AMBULATORY PERITONEAL DIALYSIS  (CAPD) CATHETER;  Surgeon: Michael Boston, MD;  Location: [place];  Service: General;  Laterality: Bilateral;  . CARDIAC CATHETERIZATION  10/04/11   Normal left main, 95% pLAD stenosis with ulceration s/p successful BMS placement, 30% segmental plaque beyond this, dLAD after D2 with 50% plaquing, then focal 70% lesion, 80% focal short D2 stenosis, 30% pOM2 lesion, 30-40% mOM3 stenosis, Shephard's crook region of RCA with 50% lesion, mRCA 50-60% lesion and mild dRCA irregularities.   . COLONOSCOPY W/ POLYPECTOMY  2006   Dr. Tamala Julian: anal fissure, sessile adenomatous polyp, diverticulosis  . CORONARY STENT PLACEMENT  10/04/11  . ESOPHAGOGASTRODUODENOSCOPY  11/15/2010   Procedure: ESOPHAGOGASTRODUODENOSCOPY (EGD);  Surgeon: Dorothyann Peng, MD;  Location: [facility];  Service: Endoscopy;  Laterality: N/A;  with propofol sedation; procedure start @ 0813  . EXCHANGE OF A DIALYSIS CATHETER Left 05/14/2015   Procedure: EXCHANGE OF A DIALYSIS CATHETER-LEFT INTERNAL JUGULAR VEIN;  Surgeon: Conrad Kennard, MD;  Location: [place];  Service: Vascular;  Laterality: Left;  . FLEXIBLE SIGMOIDOSCOPY   11/15/2010   Procedure: FLEXIBLE SIGMOIDOSCOPY;  Surgeon: Dorothyann Peng, MD;  Location: [facility];  Service: Endoscopy;  Laterality: N/A;  with propofol sedation; ended @ 0808  . HEMATOMA EVACUATION Left 05/30/2015   Procedure: EVACUATION NECK HEMATOMA;  Surgeon: Conrad [place], MD;  Location: [place];  Service: Vascular;  Laterality: Left;  . INGUINAL HERNIA REPAIR Right 07/16/2015   Procedure: LAPAROSCOPIC RIGHT INGUINAL HERNIA WITH MESH;  Surgeon: Michael Boston, MD;  Location: [place];  Service: General;  Laterality: Right;  . INSERTION OF DIALYSIS CATHETER Right 08/05/2014   Procedure: INSERTION OF DIALYSIS CATHETER Right internal Jugular;  Surgeon: Conrad [place], MD;  Location: [place];  Service:  Vascular;  Laterality: Right;  . INSERTION OF DIALYSIS CATHETER Left 02/13/2015   Procedure: INSERTION OF DIALYSIS CATHETER AND CENTRAL VENOGRAM;  Surgeon: Serafina Mitchell, MD;  Location: [place];  Service: Vascular;  Laterality: Left;  . INSERTION OF DIALYSIS CATHETER Right 05/30/2015   Procedure: INSERTION OF DIALYSIS CATHETER;  Surgeon: Conrad Chisholm, MD;  Location: [place];  Service: Vascular;  Laterality: Right;  . LEFT HEART CATHETERIZATION WITH CORONARY ANGIOGRAM N/A 10/04/2011   Procedure: LEFT HEART CATHETERIZATION WITH CORONARY ANGIOGRAM;  Surgeon: Hillary Bow, MD;  Location: [facility] C[facility];  Service: Cardiovascular;  Laterality: N/A;  . LIGATION OF ARTERIOVENOUS  FISTULA Left 04/15/2015   Procedure: LIGATION OF BRACHIOCEPHALIC ARTERIOVENOUS  FISTULA;  Surgeon: Mal Misty, MD;  Location: [place];  Service: Vascular;  Laterality: Left;  . PATCH ANGIOPLASTY Left 04/09/2015   Procedure: PATCH ANGIOPLASTY;  Surgeon: Conrad [place], MD;  Location: [place];  Service: Vascular;  Laterality: Left;  . PERIPHERAL VASCULAR CATHETERIZATION Left 01/15/2015   Procedure: Fistulagram;  Surgeon: Conrad Boyd, MD;  Location: [place] CV LAB;  Service: Cardiovascular;  Laterality: Left;  arm  . PERIPHERAL VASCULAR CATHETERIZATION  Left 01/15/2015   Procedure: Peripheral Vascular Intervention;  Surgeon: Conrad Goldonna, MD;  Location: [place] [facility];  Service: Cardiovascular;  Laterality: Left;  ARM VEINOUS PTA  . PERIPHERAL VASCULAR CATHETERIZATION N/A 04/07/2015   Procedure: A/V Shuntogram/Fistulagram;  Surgeon: Conrad [place], MD;  Location: [place] [facility];  Service: Cardiovascular;  Laterality: N/A;  . POLYPECTOMY  12/13/2010   Procedure: POLYPECTOMY;  Surgeon: Dorothyann Peng, MD;  Location: [facility];  Service: Endoscopy;  Laterality: N/A;  right colon, cecal, transverse, and sigmoid polypectomy  . PORT-A-CATH REMOVAL    . REMOVAL OF A DIALYSIS CATHETER Left 05/30/2015   Procedure: REMOVAL OF A DIALYSIS CATHETER LEFT INTERNAL JUGULAR;  Surgeon: Conrad , MD;  Location: [place];  Service: Vascular;  Laterality: Left;  . REVISON OF ARTERIOVENOUS FISTULA Left [id number]   Procedure: PLICATION OF LEFT ARM PSEUDOANEURYSM;  Surgeon: Conrad , MD;  Location: [place];  Service: Vascular;  Laterality: Left;    Social History   Social History  . Marital status: Married    Spouse name: N/A  . Number of children: N/A  . Years of education: N/A   Occupational History  . retired     [employer]   Social History Main Topics  . Smoking status: Former Smoker    Packs/day: 0.50    Years: 50.00    Types: Cigarettes    Start date: 06/25/1957    Quit date: 03/04/2013  . Smokeless tobacco: Never Used  . Alcohol use No     Comment: None for several years  . Drug use: No  . Sexual activity: No   Other Topics Concern  . Not on file   Social History Narrative   Married, retired from [employer], 6 children   Right handed   Caffeine use - none     Vitals:   02/22/16 1039  BP: 121/80  Pulse: 66  SpO2: 99%  Weight: 164 lb 6.4 oz (74.6 kg)  Height: 6\' 3"  (1.905 m)    PHYSICAL EXAM General: NAD HEENT: Normal. Neck: No JVD, no thyromegaly. Lungs: Clear to auscultation bilaterally with normal respiratory  effort. CV: Nondisplaced PMI.  Regular rate and rhythm, normal S1/S2, no S3/S4, no murmur. No pretibial or periankle edema.  No carotid bruit.   Abdomen:  Soft, nontender, no distention.  Neurologic: Alert and oriented.  Psych: Normal affect. Skin: Normal. Musculoskeletal: No gross deformities.    ECG: Most recent ECG reviewed.      ASSESSMENT AND PLAN: 1. CAD: Stable ischemic heart disease with mild exertional dypsnea. Continue aspirin and Lipitor.   2. Malignant HTN: Controlled. No changes.  3. Chronic systolic heart failure: Euvolemic and stable. LVEF is now normal as noted above.  4. Hyperlipidemia: Continue current therapy with Lipitor 80 mg and Zetia 10 mg.  5. ESRD on hemodialysis.   Dispo: f/u 1 yr  Kate Sable, M.D., F.A.C.C.

## 2016-03-01 ENCOUNTER — Other Ambulatory Visit: Payer: Self-pay | Admitting: *Deleted

## 2016-03-01 DIAGNOSIS — Z01810 Encounter for preprocedural cardiovascular examination: Secondary | ICD-10-CM

## 2016-03-01 DIAGNOSIS — N186 End stage renal disease: Secondary | ICD-10-CM

## 2016-03-11 ENCOUNTER — Encounter: Payer: Self-pay | Admitting: Vascular Surgery

## 2016-03-15 ENCOUNTER — Emergency Department (HOSPITAL_COMMUNITY)
Admission: EM | Admit: 2016-03-15 | Discharge: 2016-03-15 | Disposition: A | Discharge status: Home / Self Care | Payer: Medicare Other | Attending: Emergency Medicine | Admitting: Emergency Medicine

## 2016-03-15 ENCOUNTER — Encounter (HOSPITAL_COMMUNITY): Payer: Self-pay | Admitting: Emergency Medicine

## 2016-03-15 ENCOUNTER — Emergency Department (HOSPITAL_COMMUNITY): Payer: Medicare Other

## 2016-03-15 DIAGNOSIS — Z7982 Long term (current) use of aspirin: Secondary | ICD-10-CM | POA: Diagnosis not present

## 2016-03-15 DIAGNOSIS — N186 End stage renal disease: Secondary | ICD-10-CM | POA: Diagnosis not present

## 2016-03-15 DIAGNOSIS — Y92009 Unspecified place in unspecified non-institutional (private) residence as the place of occurrence of the external cause: Secondary | ICD-10-CM | POA: Insufficient documentation

## 2016-03-15 DIAGNOSIS — W19XXXA Unspecified fall, initial encounter: Secondary | ICD-10-CM | POA: Diagnosis not present

## 2016-03-15 DIAGNOSIS — I2510 Atherosclerotic heart disease of native coronary artery without angina pectoris: Secondary | ICD-10-CM | POA: Diagnosis not present

## 2016-03-15 DIAGNOSIS — J449 Chronic obstructive pulmonary disease, unspecified: Secondary | ICD-10-CM | POA: Insufficient documentation

## 2016-03-15 DIAGNOSIS — I132 Hypertensive heart and chronic kidney disease with heart failure and with stage 5 chronic kidney disease, or end stage renal disease: Secondary | ICD-10-CM | POA: Insufficient documentation

## 2016-03-15 DIAGNOSIS — Y939 Activity, unspecified: Secondary | ICD-10-CM | POA: Diagnosis not present

## 2016-03-15 DIAGNOSIS — I5022 Chronic systolic (congestive) heart failure: Secondary | ICD-10-CM | POA: Diagnosis not present

## 2016-03-15 DIAGNOSIS — Y999 Unspecified external cause status: Secondary | ICD-10-CM | POA: Diagnosis not present

## 2016-03-15 DIAGNOSIS — Z87891 Personal history of nicotine dependence: Secondary | ICD-10-CM | POA: Diagnosis not present

## 2016-03-15 DIAGNOSIS — Z79899 Other long term (current) drug therapy: Secondary | ICD-10-CM | POA: Insufficient documentation

## 2016-03-15 DIAGNOSIS — Z992 Dependence on renal dialysis: Secondary | ICD-10-CM | POA: Diagnosis not present

## 2016-03-15 DIAGNOSIS — S300XXA Contusion of lower back and pelvis, initial encounter: Secondary | ICD-10-CM | POA: Diagnosis not present

## 2016-03-15 DIAGNOSIS — S3992XA Unspecified injury of lower back, initial encounter: Secondary | ICD-10-CM | POA: Diagnosis present

## 2016-03-15 NOTE — ED Notes (Signed)
Pt back from X-ray.  

## 2016-03-15 NOTE — ED Provider Notes (Signed)
[facility] DEPT Provider Note   CSN: [id number] Arrival date & time: 03/15/16  1031  By signing my name below, I, Gwenlyn Fudge, attest that this documentation has been prepared under the direction and in the presence of Davonna Belling, MD. Electronically Signed: Gwenlyn Fudge, ED Scribe. 03/15/16. 10:56 AM.   History   Chief Complaint Chief Complaint  Patient presents with  . Fall   The history is provided by the patient. No language interpreter was used.   HPI Comments: [name] is a 70 y.o. male who presents to the Emergency Department complaining of constant right hip pain s/p fall that occurred 9 days PTA. Pt states he initially had pain to the rear of his right hip and that the pain has not improved. Pt is ambulatory, but has pain while doing so. He denies back problems or problems with this hip before fall.  Past Medical History:  Diagnosis Date  . Abdominal aortic aneurysm ([place])    Stent graft repair  . Alcohol abuse    wife states he was never heavy drinker  . Anemia   . Anxiety    Takes Citalopram  . Arthritis    knees  . Ataxic gait   . Atrial fibrillation ([place])   . CAD (coronary artery disease)   . Cardiomyopathy    EF of 30% per echo in June of 2012; admitted with congestive heart failure; nonobstructive CAD on cath in 08/2010  . Cerebrovascular disease 2011   CVA  in 02/2010-only deficit is decreased vision in  right eye  . Chronic kidney disease (CKD)    dialysis MWF at [facility] in [place]  . Chronic systolic CHF (congestive heart failure) ([place])   . Constipation   . COPD (chronic obstructive pulmonary disease) ([place])   . Dementia    early onset  . Depression   . Effusion of right knee joint 12/06/2012  . Enlarged prostate   . Forgetfulness   . Frequent falls   . GERD (gastroesophageal reflux disease)   . History of noncompliance with medical treatment   . Hyperlipidemia   . Hypertension   . Leg pain   . Myocardial infarction acute  10/01/11  . Stroke [facility]) 2011   decreased vision of right eye    Most recent 05/23/14 - bleed - has a drag to his left leg  . Tobacco abuse    40 pack years  . Weakness generalized     Patient Active Problem List   Diagnosis Date Noted  . Apathy 11/05/2015  . Acute respiratory failure with hypoxia ([place]) 07/21/2015  . Essential hypertension 07/20/2015  . Coagulopathy ([place]) 07/19/2015  . Fecal impaction ([place]) 07/19/2015  . Retroperitoneal hemorrhage 07/18/2015  . Acute blood loss anemia 07/18/2015  . Hypotension 07/18/2015  . Aspiration pneumonia ([place]) 07/18/2015  . Thrombocytopenia ([place]) 07/18/2015  . Hyperkalemia 07/17/2015  . TIA (transient ischemic attack) 07/17/2015  . Elevated troponin I level 07/17/2015  . End stage renal disease ([place]) 04/15/2015  . Aftercare following surgery of the circulatory system 04/15/2015  . Left arm swelling 04/15/2015  . Traumatic hematoma of left upper arm 02/11/2015  . Orthostatic hypotension 02/11/2015  . Anemia of chronic disease 02/11/2015  . End-stage renal disease on hemodialysis ([place]) 02/09/2015  . MCI (mild cognitive impairment) 10/07/2014  . Memory loss 10/07/2014  . Dementia 10/07/2014  . Stroke ([place]) 07/21/2014  . HLD (hyperlipidemia) 07/21/2014  . Paroxysmal atrial fibrillation ([place]) 07/21/2014  . Dizziness   . Malnutrition of moderate  degree ([place]) 05/24/2014  . ICH (intracerebral hemorrhage) ([place]) 05/23/2014  . HTN (hypertension) 05/19/2014  . Diarrhea 11/25/2013  . Fecal impaction with overflow incontinence of loose stool unlikely infectious 11/25/2013  . Aftercare following surgery of the circulatory system, [place] 04/05/2013  . Protein-calorie malnutrition, severe ([place]) 12/06/2012  . Fall at home 12/06/2012  . UTI (urinary tract infection) 12/06/2012  . Effusion of right knee joint 12/06/2012  . Altered mental status 12/05/2012  . Vascular dementia 12/05/2012  . CAD (coronary artery disease) 10/07/2011  . Chronic systolic  CHF (congestive heart failure) ([place]) 10/07/2011  . Acute respiratory disease 10/03/2011  . Non-STEMI (non-ST elevated myocardial infarction) ([place]) 10/02/2011  . Hypokalemia 10/02/2011  . Abdominal aneurysm without mention of rupture 04/04/2011  . History of noncompliance with medical treatment   . Cerebrovascular disease   . Adenomatous polyp 10/19/2010  . Hypertensive heart disease without CHF   . Hyperlipidemia   . Alcohol abuse   . Tobacco abuse   . Chronic obstructive pulmonary disease ([place]) 02/09/2010  . History of  abdominal aortic aneurysm (stent graft)     Past Surgical History:  Procedure Laterality Date  . ABDOMINAL AORTIC ANEURYSM REPAIR W/ ENDOLUMINAL GRAFT     01/16/2008  . AV FISTULA PLACEMENT Left 07/07/2014   Procedure: Creation of Left Arm ARTERIOVENOUS Fistula;  Surgeon: Rosetta Posner, MD;  Location: [facility];  Service: Vascular;  Laterality: Left;  . CAPD INSERTION Bilateral 07/16/2015   Procedure: LAPAROSCOPIC INSERTION CONTINUOUS AMBULATORY PERITONEAL DIALYSIS  (CAPD) CATHETER;  Surgeon: Michael Boston, MD;  Location: [facility];  Service: General;  Laterality: Bilateral;  . CARDIAC CATHETERIZATION  10/04/11   Normal left main, 95% pLAD stenosis with ulceration s/p successful BMS placement, 30% segmental plaque beyond this, dLAD after D2 with 50% plaquing, then focal 70% lesion, 80% focal short D2 stenosis, 30% pOM2 lesion, 30-40% mOM3 stenosis, Shephard's crook region of RCA with 50% lesion, mRCA 50-60% lesion and mild dRCA irregularities.   . COLONOSCOPY W/ POLYPECTOMY  2006   Dr. Tamala Julian: anal fissure, sessile adenomatous polyp, diverticulosis  . CORONARY STENT PLACEMENT  10/04/11  . ESOPHAGOGASTRODUODENOSCOPY  11/15/2010   Procedure: ESOPHAGOGASTRODUODENOSCOPY (EGD);  Surgeon: Dorothyann Peng, MD;  Location: [facility];  Service: Endoscopy;  Laterality: N/A;  with propofol sedation; procedure start @ 0813  . EXCHANGE OF A DIALYSIS CATHETER Left 05/14/2015   Procedure: EXCHANGE OF A  DIALYSIS CATHETER-LEFT INTERNAL JUGULAR VEIN;  Surgeon: Conrad [place], MD;  Location: [facility];  Service: Vascular;  Laterality: Left;  . FLEXIBLE SIGMOIDOSCOPY  11/15/2010   Procedure: FLEXIBLE SIGMOIDOSCOPY;  Surgeon: Dorothyann Peng, MD;  Location: [facility];  Service: Endoscopy;  Laterality: N/A;  with propofol sedation; ended @ 0808  . HEMATOMA EVACUATION Left 05/30/2015   Procedure: EVACUATION NECK HEMATOMA;  Surgeon: Conrad [place], MD;  Location: [facility];  Service: Vascular;  Laterality: Left;  . INGUINAL HERNIA REPAIR Right 07/16/2015   Procedure: LAPAROSCOPIC RIGHT INGUINAL HERNIA WITH MESH;  Surgeon: Michael Boston, MD;  Location: [facility];  Service: General;  Laterality: Right;  . INSERTION OF DIALYSIS CATHETER Right 08/05/2014   Procedure: INSERTION OF DIALYSIS CATHETER Right internal Jugular;  Surgeon: Conrad Colfax, MD;  Location: [facility];  Service: Vascular;  Laterality: Right;  . INSERTION OF DIALYSIS CATHETER Left 02/13/2015   Procedure: INSERTION OF DIALYSIS CATHETER AND CENTRAL VENOGRAM;  Surgeon: Serafina Mitchell, MD;  Location: [facility];  Service: Vascular;  Laterality: Left;  . INSERTION OF DIALYSIS  CATHETER Right 05/30/2015   Procedure: INSERTION OF DIALYSIS CATHETER;  Surgeon: Conrad Monterey, MD;  Location: [facility];  Service: Vascular;  Laterality: Right;  . LEFT HEART CATHETERIZATION WITH CORONARY ANGIOGRAM N/A 10/04/2011   Procedure: LEFT HEART CATHETERIZATION WITH CORONARY ANGIOGRAM;  Surgeon: Hillary Bow, MD;  Location: [facility] CATH LAB;  Service: Cardiovascular;  Laterality: N/A;  . LIGATION OF ARTERIOVENOUS  FISTULA Left 04/15/2015   Procedure: LIGATION OF BRACHIOCEPHALIC ARTERIOVENOUS  FISTULA;  Surgeon: Mal Misty, MD;  Location: [facility];  Service: Vascular;  Laterality: Left;  . PATCH ANGIOPLASTY Left 04/09/2015   Procedure: PATCH ANGIOPLASTY;  Surgeon: Conrad Erie, MD;  Location: [facility];  Service: Vascular;  Laterality: Left;  . PERIPHERAL VASCULAR CATHETERIZATION Left 01/15/2015   Procedure:  Fistulagram;  Surgeon: Conrad Clanton, MD;  Location: [facility] CV LAB;  Service: Cardiovascular;  Laterality: Left;  arm  . PERIPHERAL VASCULAR CATHETERIZATION Left 01/15/2015   Procedure: Peripheral Vascular Intervention;  Surgeon: Conrad Poplar, MD;  Location: [facility] CV LAB;  Service: Cardiovascular;  Laterality: Left;  ARM VEINOUS PTA  . PERIPHERAL VASCULAR CATHETERIZATION N/A 04/07/2015   Procedure: A/V Shuntogram/Fistulagram;  Surgeon: Conrad [place]port, MD;  Location: [facility] CV LAB;  Service: Cardiovascular;  Laterality: N/A;  . POLYPECTOMY  12/13/2010   Procedure: POLYPECTOMY;  Surgeon: Dorothyann Peng, MD;  Location: [facility];  Service: Endoscopy;  Laterality: N/A;  right colon, cecal, transverse, and sigmoid polypectomy  . PORT-A-CATH REMOVAL    . REMOVAL OF A DIALYSIS CATHETER Left 05/30/2015   Procedure: REMOVAL OF A DIALYSIS CATHETER LEFT INTERNAL JUGULAR;  Surgeon: Conrad [place], MD;  Location: [facility];  Service: Vascular;  Laterality: Left;  . REVISON OF ARTERIOVENOUS FISTULA Left [id number]   Procedure: PLICATION OF LEFT ARM PSEUDOANEURYSM;  Surgeon: Conrad [place], MD;  Location: [facility];  Service: Vascular;  Laterality: Left;       Home Medications    Prior to Admission medications   Medication Sig Start Date End Date Taking? Authorizing Provider  amLODipine (NORVASC) 10 MG tablet Take 10 mg by mouth daily. 10/15/15  Yes Historical Provider, MD  aspirin EC 81 MG tablet Take 1 tablet (81 mg total) by mouth every evening. Increase aspirin to 325mg  daily after 2 weeks 07/25/15  Yes Kathie Dike, MD  atorvastatin (LIPITOR) 80 MG tablet TAKE 1 TABLET (80 MG TOTAL) BY MOUTH DAILY AT 6 PM. 08/19/15  Yes Herminio Commons, MD  b complex-vitamin c-folic acid (NEPHRO-VITE) 0.8 MG TABS tablet Take 1 tablet by mouth daily. 12/15/14  Yes Historical Provider, MD  calcitRIOL (ROCALTROL) 0.25 MCG capsule Take 0.25 mcg by mouth 2 (two) times daily.    Yes Historical Provider, MD  citalopram (CELEXA)  10 MG tablet TAKE 1 TABLET BY MOUTH EVERY DAY 12/09/15  Yes Mikey Kirschner, MD  cloNIDine (CATAPRES) 0.1 MG tablet Take 0.1 mg by mouth 2 (two) times daily. 01/23/16  Yes Historical Provider, MD  ezetimibe (ZETIA) 10 MG tablet TAKE 1 TABLET (10 MG TOTAL) BY MOUTH DAILY. 01/11/16  Yes Herminio Commons, MD  gentamicin cream (GARAMYCIN) 0.1 % APPLY ONCE DAILY BEFORE DRESSING 10/16/15  Yes Historical Provider, MD  KLOR-CON 10 10 MEQ tablet Take 10 mEq by mouth daily.  02/01/16  Yes Historical Provider, MD  pantoprazole (PROTONIX) 40 MG tablet TAKE 1 TABLET (40 MG TOTAL) BY MOUTH DAILY. Patient taking differently: TAKE 1 TABLET (40 MG TOTAL) BY MOUTH DAILY. - every evening  03/06/15  Yes Mahala Menghini, PA-C  polyethylene glycol (MIRALAX / GLYCOLAX) packet Take 17 g by mouth daily. 11/29/13  Yes Kathie Dike, MD  cloNIDine (CATAPRES - DOSED IN MG/24 HR) 0.1 mg/24hr patch Place 0.1 mg onto the skin once a week.  11/03/15   Historical Provider, MD    Family History Family History  Problem Relation Age of Onset  . Cancer Mother     Gall Bladder  . Heart disease Mother     before age 27  . Hyperlipidemia Mother   . Hypertension Mother   . Asthma Mother   . Cancer Sister     Cervical  . Colon cancer Neg Hx   . Anesthesia problems Neg Hx   . Hypotension Neg Hx   . Malignant hyperthermia Neg Hx   . Pseudochol deficiency Neg Hx     Social History Social History  Substance Use Topics  . Smoking status: Former Smoker    Packs/day: 0.50    Years: 50.00    Types: Cigarettes    Start date: 06/25/1957    Quit date: 03/04/2013  . Smokeless tobacco: Never Used  . Alcohol use No     Comment: None for several years     Allergies   Patient has no known allergies.   Review of Systems Review of Systems  Musculoskeletal: Positive for arthralgias.  All other systems reviewed and are negative.    Physical Exam Updated Vital Signs BP 132/81 (BP Location: Right Arm)   Pulse 65   Temp 98.6  F (37 C) (Oral)   Resp 15   Ht 6\' 3"  (1.905 m)   Wt 160 lb (72.6 kg)   SpO2 100%   BMI 20.00 kg/m   Physical Exam  Constitutional: He is oriented to person, place, and time. He appears well-developed and well-nourished. He is active. No distress.  HENT:  Head: Normocephalic and atraumatic.  Eyes: Conjunctivae are normal.  Cardiovascular: Normal rate.   Pulmonary/Chest: Effort normal. No respiratory distress.  Musculoskeletal: Normal range of motion.  Tenderness over the right buttock area No deformity Good ROM in hip No lateral tenderness Tenderness over right ischium No lumbar or abdominal tenderness Good pulse in right foot  Neurological: He is alert and oriented to person, place, and time.  Skin: Skin is warm and dry.  Psychiatric: He has a normal mood and affect. His behavior is normal.  Nursing note and vitals reviewed.    ED Treatments / Results  COORDINATION OF CARE: 10:55 AM Discussed treatment plan with pt at bedside which includes Pelvis XR and pt agreed to plan.  Labs (all labs ordered are listed, but only abnormal results are displayed) Labs Reviewed - No data to display  EKG  EKG Interpretation None       Radiology Dg Pelvis 1-2 Views  Result Date: 03/15/2016 CLINICAL DATA:  Right posterior hip pain EXAM: PELVIS - 1-2 VIEW COMPARISON:  None. FINDINGS: There is no evidence of pelvic fracture or diastasis. No pelvic bone lesions are seen. A left peritoneal dialysis catheter is noted with tip in midline lower pelvis. Aorto bi-iliac stent is noted. IMPRESSION: Negative. Electronically Signed   By: Lahoma Crocker M.D.   On: 03/15/2016 11:13    Procedures Procedures (including critical care time)  Medications Ordered in ED Medications - No data to display   Initial Impression / Assessment and Plan / ED Course  I have reviewed the triage vital signs and the nursing notes.  Pertinent labs &  imaging results that were available during my care of the  patient were reviewed by me and considered in my medical decision making (see chart for details).  Clinical Course   I personally performed the services described in this documentation, which was scribed in my presence. The recorded information has been reviewed and is accurate.    Patient with fall 9 days ago. Tenderness over but ox. Negative x-ray. Neurovascular intact. Will discharge home. Discussed with patient and family. Discussed with patient and family about risk of occult fracture.  Final Clinical Impressions(s) / ED Diagnoses   Final diagnoses:  Fall, initial encounter  Contusion of buttock, initial encounter    New Prescriptions New Prescriptions   No medications on file     Davonna Belling, MD 03/15/16 1211

## 2016-03-15 NOTE — ED Notes (Signed)
Pt taken to Xray.

## 2016-03-15 NOTE — ED Triage Notes (Signed)
Pt fell 9 days ago at home and got himself up. Pt states pain in Right bottom area.

## 2016-03-15 NOTE — ED Notes (Signed)
PT family was called by this nurse bc pt left his personal pillow on the stretcher at discharge and his family told the ED to throw the pillow away.

## 2016-03-24 ENCOUNTER — Ambulatory Visit (INDEPENDENT_AMBULATORY_CARE_PROVIDER_SITE_OTHER): Payer: Medicare Other | Admitting: Vascular Surgery

## 2016-03-24 ENCOUNTER — Encounter: Payer: Self-pay | Admitting: Vascular Surgery

## 2016-03-24 ENCOUNTER — Ambulatory Visit (HOSPITAL_COMMUNITY)
Admission: RE | Admit: 2016-03-24 | Discharge: 2016-03-24 | Disposition: A | Discharge status: Home / Self Care | Payer: Medicare Other | Source: Ambulatory Visit | Attending: Vascular Surgery | Admitting: Vascular Surgery

## 2016-03-24 VITALS — BP 136/88 | HR 75 | Temp 97.0°F | Resp 14 | Ht 75.0 in | Wt 160.0 lb

## 2016-03-24 DIAGNOSIS — Z01810 Encounter for preprocedural cardiovascular examination: Secondary | ICD-10-CM | POA: Insufficient documentation

## 2016-03-24 DIAGNOSIS — Z992 Dependence on renal dialysis: Secondary | ICD-10-CM

## 2016-03-24 DIAGNOSIS — N186 End stage renal disease: Secondary | ICD-10-CM | POA: Insufficient documentation

## 2016-03-26 NOTE — Progress Notes (Signed)
Patient is a 71 year old male sent for evaluation for placement of a hemodialysis access.  The patient currently is on peritoneal dialysis and is not having difficulty with this. He was referred for a hemodialysis access as a backup. The patient was previously evaluated by my partner Dr. [name] in March 2017 for similar indications. At that time the patient had a vein mapping performed which showed poor quality vein in upper extremities. He was thought only be a candidate for an AV graft at that time. The decision was made to not maintain an AV graft with its inherent risk of thrombosis if the patient would not be utilizing the graft. The patient has had a prior left brachiocephalic AV fistula which is now occluded. Chronic medical problems include coronary artery disease prior stroke COPD CHF hypertension hyperlipidemia all of which are currently stable.  Of note the patient has also had endovascular repair of an aneurysm and we follow him chronically for that. He is scheduled for follow-up of this in March 2018 with our nurse practitioner.  Past Medical History:  Diagnosis Date  . Abdominal aortic aneurysm ([place])    Stent graft repair  . Alcohol abuse    wife states he was never heavy drinker  . Anemia   . Anxiety    Takes Citalopram  . Arthritis    knees  . Ataxic gait   . Atrial fibrillation ([place])   . CAD (coronary artery disease)   . Cardiomyopathy    EF of 30% per echo in June of 2012; admitted with congestive heart failure; nonobstructive CAD on cath in 08/2010  . Cerebrovascular disease 2011   CVA  in 02/2010-only deficit is decreased vision in  right eye  . Chronic kidney disease (CKD)    dialysis MWF at [facility] in [place]  . Chronic systolic CHF (congestive heart failure) ([place])   . Constipation   . COPD (chronic obstructive pulmonary disease) ([place])   . Dementia    early onset  . Depression   . Effusion of right knee joint 12/06/2012  . Enlarged prostate   . Forgetfulness   .  Frequent falls   . GERD (gastroesophageal reflux disease)   . History of noncompliance with medical treatment   . Hyperlipidemia   . Hypertension   . Leg pain   . Myocardial infarction acute 10/01/11  . Stroke [facility]) 2011   decreased vision of right eye    Most recent 05/23/14 - bleed - has a drag to his left leg  . Tobacco abuse    40 pack years  . Weakness generalized    Current Outpatient Prescriptions on File Prior to Visit  Medication Sig Dispense Refill  . amLODipine (NORVASC) 10 MG tablet Take 10 mg by mouth daily.  5  . aspirin EC 81 MG tablet Take 1 tablet (81 mg total) by mouth every evening. Increase aspirin to 325mg  daily after 2 weeks    . atorvastatin (LIPITOR) 80 MG tablet TAKE 1 TABLET (80 MG TOTAL) BY MOUTH DAILY AT 6 PM. 90 tablet 3  . b complex-vitamin c-folic acid (NEPHRO-VITE) 0.8 MG TABS tablet Take 1 tablet by mouth daily.  11  . calcitRIOL (ROCALTROL) 0.25 MCG capsule Take 0.25 mcg by mouth 2 (two) times daily.     . citalopram (CELEXA) 10 MG tablet TAKE 1 TABLET BY MOUTH EVERY DAY 30 tablet 3  . cloNIDine (CATAPRES - DOSED IN MG/24 HR) 0.1 mg/24hr patch Place 0.1 mg onto the skin once a week.  4  . cloNIDine (CATAPRES) 0.1 MG tablet Take 0.1 mg by mouth 2 (two) times daily.  5  . ezetimibe (ZETIA) 10 MG tablet TAKE 1 TABLET (10 MG TOTAL) BY MOUTH DAILY. 30 tablet 6  . gentamicin cream (GARAMYCIN) 0.1 % APPLY ONCE DAILY BEFORE DRESSING  5  . KLOR-CON 10 10 MEQ tablet Take 10 mEq by mouth daily.   4  . pantoprazole (PROTONIX) 40 MG tablet TAKE 1 TABLET (40 MG TOTAL) BY MOUTH DAILY. (Patient taking differently: TAKE 1 TABLET (40 MG TOTAL) BY MOUTH DAILY. - every evening) 30 tablet 11  . polyethylene glycol (MIRALAX / GLYCOLAX) packet Take 17 g by mouth daily. 14 each 0  . [DISCONTINUED] metoprolol (TOPROL-XL) 50 MG 24 hr tablet Take 50 mg by mouth daily.      . [DISCONTINUED] niacin (NIASPAN) 500 MG CR tablet Take 500 mg by mouth at bedtime.      . [DISCONTINUED]  omeprazole (PRILOSEC) 20 MG capsule 1 po every morning 30 capsule 5   No current facility-administered medications on file prior to visit.     Review of systems: He becomes short of breath with minimal exertion. He denies chest pain.  Physical exam:  Vitals:   03/24/16 1213  BP: 136/88  Pulse: 75  Resp: 14  Temp: 97 F (36.1 C)  TempSrc: Oral  SpO2: 96%  Weight: 160 lb (72.6 kg)  Height: 6\' 3"  (1.905 m)   Chest: Clear to auscultation bilaterally Extremities: 2+ brachial and radial pulses bilaterally. Abdomen: No pulsatile abdominal mass  Data: Patient had an upper extremity arterial duplex performed today which showed normal brachial artery anatomy with 5 mm brachial artery bilaterally 2 mm radial artery bilaterally.  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 We have been asked to place a backup hemodialysis access. However, the patient would only be a candidate for placement of an AV graft with chances of thrombosis of 25% per year. I agree with Dr. [name] that I believe we should wait before placing an AV graft until the patient requires this as we will be maintaining a graft that is not currently in use and also be using access sites up without him being currently used.  The patient will follow-up with [place] on as-needed basis if Dr Hinda Lenis wishes for [place] to place an AV graft at this time or in the future.  Ruta Hinds, MD [facility] Office: [phone] Pager: [phone]

## 2016-04-11 ENCOUNTER — Other Ambulatory Visit: Payer: Self-pay | Admitting: Family Medicine

## 2016-04-11 NOTE — Telephone Encounter (Signed)
Last filled 12/09/15, was not discussed at 11/17 OV. Please advise.

## 2016-05-17 ENCOUNTER — Other Ambulatory Visit: Payer: Self-pay | Admitting: Family Medicine

## 2016-06-13 ENCOUNTER — Other Ambulatory Visit (HOSPITAL_COMMUNITY): Payer: Medicare Other

## 2016-06-13 ENCOUNTER — Ambulatory Visit: Payer: Medicare Other | Admitting: Family

## 2016-06-15 ENCOUNTER — Other Ambulatory Visit: Payer: Self-pay | Admitting: *Deleted

## 2016-06-15 MED ORDER — CITALOPRAM HYDROBROMIDE 10 MG PO TABS: 10.0000 mg | ORAL_TABLET | Freq: Every day | ORAL | 0 refills | Status: DC

## 2016-06-30 ENCOUNTER — Encounter: Payer: Self-pay | Admitting: Family

## 2016-07-07 ENCOUNTER — Ambulatory Visit (INDEPENDENT_AMBULATORY_CARE_PROVIDER_SITE_OTHER): Payer: Medicare Other | Admitting: Family Medicine

## 2016-07-07 ENCOUNTER — Encounter: Payer: Self-pay | Admitting: Family Medicine

## 2016-07-07 VITALS — BP 128/86 | Ht 75.0 in | Wt 166.7 lb

## 2016-07-07 DIAGNOSIS — N186 End stage renal disease: Secondary | ICD-10-CM

## 2016-07-07 DIAGNOSIS — I10 Essential (primary) hypertension: Secondary | ICD-10-CM | POA: Diagnosis not present

## 2016-07-07 DIAGNOSIS — F0150 Vascular dementia without behavioral disturbance: Secondary | ICD-10-CM | POA: Diagnosis not present

## 2016-07-07 DIAGNOSIS — J431 Panlobular emphysema: Secondary | ICD-10-CM | POA: Diagnosis not present

## 2016-07-07 NOTE — Progress Notes (Signed)
   Subjective:    Patient ID: [name], male    DOB: 13-Aug-1945, 71 y.o.   MRN: [id number]  Hypertension  This is a chronic problem. The current episode started more than 1 year ago. Pertinent negatives include no chest pain, headaches or shortness of breath. Risk factors for coronary artery disease include male gender and dyslipidemia. Treatments tried: norvasc, clonidine. There are no compliance problems.  Hypertensive end-organ damage includes kidney disease.   Patient is taking his cholesterol medicines.Patient is also taking reflux medicine denies any problem with this Wife is doing intraperitoneal dialysis Long-term outlook is poor Gets winded with activity this is his COPD. He also has cachexia he also has poor appetiteI do not feel dietary stimulant would help Patient not eating well- gets winded and unsteady when he tries to get up but doesn't really get up and about too much. Having a lot of cluster TIAs. Calcium was up significantly so the dialysis dr added sensipar. Review of Systems  Constitutional: Positive for appetite change and fatigue. Negative for activity change and fever.  Respiratory: Negative for cough and shortness of breath.   Cardiovascular: Negative for chest pain and leg swelling.  Musculoskeletal: Positive for gait problem.  Neurological: Positive for weakness. Negative for headaches.       Objective:   Physical Exam  Constitutional: He appears well-nourished. No distress.  Cardiovascular: Normal rate, regular rhythm and normal heart sounds.   No murmur heard. Pulmonary/Chest: Effort normal and breath sounds normal. No respiratory distress.  Musculoskeletal: He exhibits no edema.  Lymphadenopathy:    He has no cervical adenopathy.  Neurological: He is alert.  Psychiatric: His behavior is normal.  Vitals reviewed.  Patient is losing muscle mass. No edema.  25 minutes was spent with the patient. Greater than half the time was spent in discussion  and answering questions and counseling regarding the issues that the patient came in for today. Long discussion held regarding his declining health and is multiple health issues including end-stage renal dialysis dementia     Assessment & Plan:  Although weight patient's weight is stable he is on peritoneal dialysis which can account for part of his weight he has obviously lost some muscle mass and is eating poorly  In stage renal disease long-term outlook is poor  Multi-infarct dementia long-term outlook is poor  Currently wife doing great job with him at home eventually he will get to the point where he will not be eating or drinking enough. At that point in time will pull back on some of the medications plus also hospice consult and potentially home visit

## 2016-07-11 ENCOUNTER — Ambulatory Visit (HOSPITAL_COMMUNITY)
Admission: RE | Admit: 2016-07-11 | Discharge: 2016-07-11 | Disposition: A | Discharge status: Home / Self Care | Payer: Medicare Other | Source: Ambulatory Visit | Attending: Family | Admitting: Family

## 2016-07-11 ENCOUNTER — Ambulatory Visit (INDEPENDENT_AMBULATORY_CARE_PROVIDER_SITE_OTHER): Payer: Medicare Other | Admitting: Family

## 2016-07-11 ENCOUNTER — Encounter: Payer: Self-pay | Admitting: Family

## 2016-07-11 VITALS — BP 134/84 | HR 70 | Temp 97.9°F | Resp 16 | Ht 75.0 in | Wt 170.0 lb

## 2016-07-11 DIAGNOSIS — Z87891 Personal history of nicotine dependence: Secondary | ICD-10-CM

## 2016-07-11 DIAGNOSIS — I714 Abdominal aortic aneurysm, without rupture, unspecified: Secondary | ICD-10-CM

## 2016-07-11 DIAGNOSIS — Z992 Dependence on renal dialysis: Secondary | ICD-10-CM | POA: Diagnosis not present

## 2016-07-11 DIAGNOSIS — N186 End stage renal disease: Secondary | ICD-10-CM | POA: Diagnosis not present

## 2016-07-11 DIAGNOSIS — I723 Aneurysm of iliac artery: Secondary | ICD-10-CM | POA: Diagnosis not present

## 2016-07-11 DIAGNOSIS — Z95828 Presence of other vascular implants and grafts: Secondary | ICD-10-CM

## 2016-07-11 DIAGNOSIS — I779 Disorder of arteries and arterioles, unspecified: Secondary | ICD-10-CM | POA: Diagnosis not present

## 2016-07-11 DIAGNOSIS — Z8679 Personal history of other diseases of the circulatory system: Secondary | ICD-10-CM

## 2016-07-11 NOTE — Progress Notes (Signed)
[facility] [place]  CC: Follow up s/p EVAR  History of Present Illness  [name] is a 71 y.o. ([birth year]) male who who is s/p EVAR (01/16/2008 using a Cook Zenith device); Dr. [name] has been monitoring him re this.   Dr. [name] reviewed pt's CT angiogram at pt's February 2015 visit. This showed a stable aneurysm sac. Diameter measurements were 4.1 x 2.8. There was no evidence of endoleak. There had been a slight increase in the size of the right common iliac artery. This had increased from 2.3 up to 2.6 when compared to his study in June of 2010. No evidence of complicating features on CT scan.Will need to pay attention to the dilated right common iliac artery on future studies.  Dr. [name] scheduled the patient to come back in one year with an abdominal ultrasound.   The patient has no back or abdominal pain. He denies claudication pain in his legs with walking, denies non-healing wounds.  But wife states he is unsteady on his feet, walks as little as possible due to this. He gets around mostly with his walker with a seat.  Wife states he seems to have lost the inclination to eat and drink, denies post prandial abdominal pain.    Chronic medical problems include coronary artery disease, prior stroke, COPD, CHF, hypertension, hyperlipidemia; all of which are currently stable.   He was evaluated by Dr. Oneida Alar on 03-24-16 for possible hemodialysis access placement. The patient currently is on peritoneal dialysis and is not having difficulty with this. He was referred for a hemodialysis access as a backup. The patient was previously evaluated by Dr. [name] in March 2017 for similar indications. At that time the patient had a vein mapping performed which showed poor quality vein in upper extremities. He was thought only be a candidate for an AV graft at that time. The decision was made to not maintain an AV graft with its inherent risk of thrombosis if the  patient would not be utilizing the graft. The patient has had a prior left brachiocephalic AV fistula which is now occluded. Dr. Oneida Alar concurred with Dr. [name]; the patient would only be a candidate for placement of an AV graft with chances of thrombosis of 25% per year, and that we should wait before placing an AV graft until the patient requires this as we will be maintaining a graft that is not currently in use and also be using access sites up without them being currently used.   He had an MI in July 2013 and completed cardiac rehabilitation. A coronary stent was placed. He reports several strokes and does not know when his last stroke was; manifested as some degree of loss of vision and trouble speaking, denies hemiplegia. He states he quit ETOH use in 2015, states he stopped that for now, documented 28 shots of liquor/week in the past. He reports feeling off balance at times, the reason he uses a walker. Noted that carotid Duplex done January, 2014 shows <40% bilateral ICA stenosis.  [name] follows pt for history of strokes; wife states pt had a series of micro bleeds in his brain in March of 2016, and states this continues.  He takes a daily statin and ASA, no other antiplatelet nor anticoagulant medications.   Pt Diabetic: No Pt smoker: former smoker (3/4 ppd x 50+ yrs, quit December 2015)  Pt meds include: Statin :Yes Betablocker: No ASA: Yes Other anticoagulants/antiplatelets: no    Past Medical History:  Diagnosis Date  . Abdominal aortic aneurysm ([place])    Stent graft repair  . Alcohol abuse    wife states he was never heavy drinker  . Anemia   . Anxiety    Takes Citalopram  . Arthritis    knees  . Ataxic gait   . Atrial fibrillation ([place])   . CAD (coronary artery disease)   . Cardiomyopathy    EF of 30% per echo in June of 2012; admitted with congestive heart failure; nonobstructive CAD on cath in 08/2010  . Cerebrovascular disease 2011   CVA  in  02/2010-only deficit is decreased vision in  right eye  . Chronic kidney disease (CKD)    dialysis MWF at [facility] in [place]  . Chronic systolic CHF (congestive heart failure) ([place])   . Constipation   . COPD (chronic obstructive pulmonary disease) ([place])   . Dementia    early onset  . Depression   . Effusion of right knee joint 12/06/2012  . Enlarged prostate   . Forgetfulness   . Frequent falls   . GERD (gastroesophageal reflux disease)   . History of noncompliance with medical treatment   . Hyperlipidemia   . Hypertension   . Leg pain   . Myocardial infarction acute ([place]) 10/01/11  . Stroke [facility]) 2011   decreased vision of right eye    Most recent 05/23/14 - bleed - has a drag to his left leg  . Tobacco abuse    40 pack years  . Weakness generalized    Past Surgical History:  Procedure Laterality Date  . ABDOMINAL AORTIC ANEURYSM REPAIR W/ ENDOLUMINAL GRAFT     01/16/2008  . AV FISTULA PLACEMENT Left 07/07/2014   Procedure: Creation of Left Arm ARTERIOVENOUS Fistula;  Surgeon: Rosetta Posner, MD;  Location: [place];  Service: Vascular;  Laterality: Left;  . CAPD INSERTION Bilateral 07/16/2015   Procedure: LAPAROSCOPIC INSERTION CONTINUOUS AMBULATORY PERITONEAL DIALYSIS  (CAPD) CATHETER;  Surgeon: Michael Boston, MD;  Location: [place];  Service: General;  Laterality: Bilateral;  . CARDIAC CATHETERIZATION  10/04/11   Normal left main, 95% pLAD stenosis with ulceration s/p successful BMS placement, 30% segmental plaque beyond this, dLAD after D2 with 50% plaquing, then focal 70% lesion, 80% focal short D2 stenosis, 30% pOM2 lesion, 30-40% mOM3 stenosis, Shephard's crook region of RCA with 50% lesion, mRCA 50-60% lesion and mild dRCA irregularities.   . COLONOSCOPY W/ POLYPECTOMY  2006   Dr. Tamala Julian: anal fissure, sessile adenomatous polyp, diverticulosis  . CORONARY STENT PLACEMENT  10/04/11  . ESOPHAGOGASTRODUODENOSCOPY  11/15/2010   Procedure: ESOPHAGOGASTRODUODENOSCOPY (EGD);  Surgeon: Dorothyann Peng, MD;  Location: [facility];  Service: Endoscopy;  Laterality: N/A;  with propofol sedation; procedure start @ 0813  . EXCHANGE OF A DIALYSIS CATHETER Left 05/14/2015   Procedure: EXCHANGE OF A DIALYSIS CATHETER-LEFT INTERNAL JUGULAR VEIN;  Surgeon: Conrad [place] Chatham, MD;  Location: [place];  Service: Vascular;  Laterality: Left;  . FLEXIBLE SIGMOIDOSCOPY  11/15/2010   Procedure: FLEXIBLE SIGMOIDOSCOPY;  Surgeon: Dorothyann Peng, MD;  Location: [facility];  Service: Endoscopy;  Laterality: N/A;  with propofol sedation; ended @ 0808  . HEMATOMA EVACUATION Left 05/30/2015   Procedure: EVACUATION NECK HEMATOMA;  Surgeon: Conrad Simpson, MD;  Location: [place];  Service: Vascular;  Laterality: Left;  . INGUINAL HERNIA REPAIR Right 07/16/2015   Procedure: LAPAROSCOPIC RIGHT INGUINAL HERNIA WITH MESH;  Surgeon: Michael Boston, MD;  Location: [place];  Service: General;  Laterality: Right;  .  INSERTION OF DIALYSIS CATHETER Right 08/05/2014   Procedure: INSERTION OF DIALYSIS CATHETER Right internal Jugular;  Surgeon: Conrad Brecon, MD;  Location: [place];  Service: Vascular;  Laterality: Right;  . INSERTION OF DIALYSIS CATHETER Left 02/13/2015   Procedure: INSERTION OF DIALYSIS CATHETER AND CENTRAL VENOGRAM;  Surgeon: Serafina Mitchell, MD;  Location: [place];  Service: Vascular;  Laterality: Left;  . INSERTION OF DIALYSIS CATHETER Right 05/30/2015   Procedure: INSERTION OF DIALYSIS CATHETER;  Surgeon: Conrad Wheatland, MD;  Location: [place];  Service: Vascular;  Laterality: Right;  . LEFT HEART CATHETERIZATION WITH CORONARY ANGIOGRAM N/A 10/04/2011   Procedure: LEFT HEART CATHETERIZATION WITH CORONARY ANGIOGRAM;  Surgeon: Hillary Bow, MD;  Location: [facility] CATH LAB;  Service: Cardiovascular;  Laterality: N/A;  . LIGATION OF ARTERIOVENOUS  FISTULA Left 04/15/2015   Procedure: LIGATION OF BRACHIOCEPHALIC ARTERIOVENOUS  FISTULA;  Surgeon: Mal Misty, MD;  Location: [place];  Service: Vascular;  Laterality: Left;  . PATCH ANGIOPLASTY Left  04/09/2015   Procedure: PATCH ANGIOPLASTY;  Surgeon: Conrad Bothell East, MD;  Location: [place];  Service: Vascular;  Laterality: Left;  . PERIPHERAL VASCULAR CATHETERIZATION Left 01/15/2015   Procedure: Fistulagram;  Surgeon: Conrad Rockport, MD;  Location: [place] CV LAB;  Service: Cardiovascular;  Laterality: Left;  arm  . PERIPHERAL VASCULAR CATHETERIZATION Left 01/15/2015   Procedure: Peripheral Vascular Intervention;  Surgeon: Conrad Salida, MD;  Location: [place] CV LAB;  Service: Cardiovascular;  Laterality: Left;  ARM VEINOUS PTA  . PERIPHERAL VASCULAR CATHETERIZATION N/A 04/07/2015   Procedure: A/V Shuntogram/Fistulagram;  Surgeon: Conrad Ple[place], MD;  Location: [place] CV LAB;  Service: Cardiovascular;  Laterality: N/A;  . POLYPECTOMY  12/13/2010   Procedure: POLYPECTOMY;  Surgeon: Dorothyann Peng, MD;  Location[facility];  Service: Endoscopy;  Laterality: N/A;  right colon, cecal, transverse, and sigmoid polypectomy  . PORT-A-CATH REMOVAL    . REMOVAL OF A DIALYSIS CATHETER Left 05/30/2015   Procedure: REMOVAL OF A DIALYSIS CATHETER LEFT INTERNAL JUGULAR;  Surgeon: Conrad Ponce de Leon, MD;  Location: [place];  Service: Vascular;  Laterality: Left;  . REVISON OF ARTERIOVENOUS FISTULA Left 06/16/9240   Procedure: PLICATION OF LEFT ARM PSEUDOANEURYSM;  Surgeon: Conrad Clayton, MD;  Location: [place];  Service: Vascular;  Laterality: Left;   Social History Social History  Substance Use Topics  . Smoking status: Former Smoker    Packs/day: 0.50    Years: 50.00    Types: Cigarettes    Start date: 06/25/1957    Quit date: 03/04/2013  . Smokeless tobacco: Never Used  . Alcohol use No     Comment: None for several years   Family History Family History  Problem Relation Age of Onset  . Cancer Mother     Gall Bladder  . Heart disease Mother     before age 84  . Hyperlipidemia Mother   . Hypertension Mother   . Asthma Mother   . Cancer Sister     Cervical  . Colon cancer Neg Hx   . Anesthesia  problems Neg Hx   . Hypotension Neg Hx   . Malignant hyperthermia Neg Hx   . Pseudochol deficiency Neg Hx    Current Outpatient Prescriptions on File Prior to Visit  Medication Sig Dispense Refill  . amLODipine (NORVASC) 10 MG tablet Take 10 mg by mouth daily.  5  . aspirin EC 81 MG tablet Take 1 tablet (81 mg total) by mouth every  evening. Increase aspirin to 325mg  daily after 2 weeks    . atorvastatin (LIPITOR) 80 MG tablet TAKE 1 TABLET (80 MG TOTAL) BY MOUTH DAILY AT 6 PM. 90 tablet 3  . b complex-vitamin c-folic acid (NEPHRO-VITE) 0.8 MG TABS tablet Take 1 tablet by mouth daily.  11  . calcitRIOL (ROCALTROL) 0.25 MCG capsule Take 0.25 mcg by mouth 2 (two) times daily.     . cinacalcet (SENSIPAR) 30 MG tablet Take 30 mg by mouth 2 (two) times daily with a meal.    . citalopram (CELEXA) 10 MG tablet Take 1 tablet (10 mg total) by mouth daily. 90 tablet 0  . cloNIDine (CATAPRES - DOSED IN MG/24 HR) 0.1 mg/24hr patch Place 0.1 mg onto the skin once a week.   4  . cloNIDine (CATAPRES) 0.1 MG tablet Take 0.1 mg by mouth 2 (two) times daily.  5  . ezetimibe (ZETIA) 10 MG tablet TAKE 1 TABLET (10 MG TOTAL) BY MOUTH DAILY. 30 tablet 6  . gentamicin cream (GARAMYCIN) 0.1 % APPLY ONCE DAILY BEFORE DRESSING  5  . KLOR-CON 10 10 MEQ tablet Take 10 mEq by mouth daily.   4  . pantoprazole (PROTONIX) 40 MG tablet TAKE 1 TABLET (40 MG TOTAL) BY MOUTH DAILY. (Patient taking differently: TAKE 1 TABLET (40 MG TOTAL) BY MOUTH DAILY. - every evening) 30 tablet 11  . polyethylene glycol (MIRALAX / GLYCOLAX) packet Take 17 g by mouth daily. 14 each 0  . [DISCONTINUED] metoprolol (TOPROL-XL) 50 MG 24 hr tablet Take 50 mg by mouth daily.      . [DISCONTINUED] niacin (NIASPAN) 500 MG CR tablet Take 500 mg by mouth at bedtime.      . [DISCONTINUED] omeprazole (PRILOSEC) 20 MG capsule 1 po every morning 30 capsule 5   No current facility-administered medications on file prior to visit.    No Known  Allergies   ROS: See HPI for pertinent positives and negatives.  Physical Examination  Vitals:   07/11/16 0926  BP: 134/84  Pulse: 70  Resp: 16  Temp: 97.9 F (36.6 C)  TempSrc: Oral  SpO2: 95%  Weight: 170 lb (77.1 kg)  Height: 6\' 3"  (1.905 m)   Body mass index is 21.25 kg/m.  General: A&O x 3, WD, using walker.  Pulmonary: Sym exp, respirations are non labored, good air movt, CTAB, no rales, rhonchi, or wheezing.   Cardiac: RRR, Nl S1, S2, no detected murmur.  Vascular: Vessel Right Left  Radial Palpable Palpable  Carotid Audible without bruit Audiblewithout bruit  Aorta Not palpable N/A  Femoral Palpable Palpable  Popliteal Not palpable Not palpable  PT notPalpable Not Palpable  DP notPalpable notPalpable   Gastrointestinal: soft, NTND, -G/R, - HSM, - palpable masses, - CVAT B.  Musculoskeletal: M/S 5/5 in UE's, 5/5 in LE's, Extremities without ischemic changes. Dry skin of lower legs.   Neurologic: Pain and light touch intact in extremities, Motor exam as listed above.  CN 2-12 intact.    Non-Invasive Vascular Imaging  EVAR Duplex (Date: 07/11/16)  AAA sac size: 2.9 cm. Decreased visualization of the bilateral common iliac arteries due to overlying bowel gas. Right CIA: 1.8 cm; Left CIA: 1.09 cm  no endoleak detected  Previous sac size: 3.3 cm on 06-01-15  ABI (06-04-15): Right: 0.78 (0.62, 08-10-07), waveforms: PT: biphasic, DP: monophasic; TBI: 9.54 Left: 0.76 (0.70, 08-10-07), waveforms: monophasic; TBI: 0.36 Moderate bilateral arterial occlusive disease.    Medical Decision Making  [name] is  a 71 y.o. male who is s/p EVAR (Date: 01/16/08). Pt is asymptomatic with stable sac size.   He does not walk enough to elicit claudication symptoms. There are no ulcers or signs of ischemia in his feet or legs.  ABI's from March 2017 show moderate bilateral lower  extremity arterial occlusive disease.   He has ESRD and his wife performs his peritoneal dialysis w/o difficulty.    PLAN:  Daily seated leg exercises as discussed and demonstrated.  Based on the patient's vascular studies and examination, pt will return to clinic in 1 year with EVAR duplex.   I discussed with the patient the importance of surveillance of the endograft.  I emphasized the importance of maximal medical management including strict control of blood pressure, blood glucose, and lipid levels, antiplatelet agents, obtaining regular exercise, and cessation of smoking.   Thank you for allowing [place] to participate in this patient's care.  Clemon Chambers, RN, MSN, FNP-C [facility] [place] Office: [facility] Physician: [name]  07/11/2016, 9:32 AM

## 2016-07-11 NOTE — Patient Instructions (Addendum)
Peripheral Vascular Disease Peripheral vascular disease (PVD) is a disease of the blood vessels that are not part of your heart and brain. A simple term for PVD is poor circulation. In most cases, PVD narrows the blood vessels that carry blood from your heart to the rest of your body. This can result in a decreased supply of blood to your arms, legs, and internal organs, like your stomach or kidneys. However, it most often affects a person's lower legs and feet. There are two types of PVD.  Organic PVD. This is the more common type. It is caused by damage to the structure of blood vessels.  Functional PVD. This is caused by conditions that make blood vessels contract and tighten (spasm). Without treatment, PVD tends to get worse over time. PVD can also lead to acute ischemic limb. This is when an arm or limb suddenly has trouble getting enough blood. This is a medical emergency. Follow these instructions at home:  Take medicines only as told by your doctor.  Do not use any tobacco products, including cigarettes, chewing tobacco, or electronic cigarettes. If you need help quitting, ask your doctor.  Lose weight if you are overweight, and maintain a healthy weight as told by your doctor.  Eat a diet that is low in fat and cholesterol. If you need help, ask your doctor.  Exercise regularly. Ask your doctor for some good activities for you.  Take good care of your feet.  Wear comfortable shoes that fit well.  Check your feet often for any cuts or sores. Contact a doctor if:  You have cramps in your legs while walking.  You have leg pain when you are at rest.  You have coldness in a leg or foot.  Your skin changes.  You are unable to get or have an erection (erectile dysfunction).  You have cuts or sores on your feet that are not healing. Get help right away if:  Your arm or leg turns cold and blue.  Your arms or legs become red, warm, swollen, painful, or numb.  You have  chest pain or trouble breathing.  You suddenly have weakness in your face, arm, or leg.  You become very confused or you cannot speak.  You suddenly have a very bad headache.  You suddenly cannot see. This information is not intended to replace advice given to you by your health care provider. Make sure you discuss any questions you have with your health care provider. Document Released: 06/01/2009 Document Revised: 08/13/2015 Document Reviewed: 08/15/2013 Elsevier Interactive Patient Education  2017 Elsevier Inc.     Before your next abdominal ultrasound:  Take two Extra-Strength Gas-X capsules at bedtime the night before the test. Take another two Extra-Strength Gas-X capsules 3 hours before the test.    

## 2016-07-12 NOTE — Addendum Note (Signed)
Addended by: Lianne Cure A on: 07/12/2016 04:53 PM   Modules accepted: Orders

## 2016-07-18 ENCOUNTER — Telehealth: Payer: Self-pay | Admitting: Family Medicine

## 2016-07-18 ENCOUNTER — Ambulatory Visit (INDEPENDENT_AMBULATORY_CARE_PROVIDER_SITE_OTHER): Payer: Medicare Other | Admitting: Family Medicine

## 2016-07-18 ENCOUNTER — Encounter: Payer: Self-pay | Admitting: Family Medicine

## 2016-07-18 VITALS — BP 102/66 | Ht 75.0 in

## 2016-07-18 DIAGNOSIS — R270 Ataxia, unspecified: Secondary | ICD-10-CM | POA: Diagnosis not present

## 2016-07-18 DIAGNOSIS — I959 Hypotension, unspecified: Secondary | ICD-10-CM

## 2016-07-18 DIAGNOSIS — R29898 Other symptoms and signs involving the musculoskeletal system: Secondary | ICD-10-CM | POA: Diagnosis not present

## 2016-07-18 MED ORDER — AMLODIPINE BESYLATE 2.5 MG PO TABS: 2.5000 mg | ORAL_TABLET | Freq: Every day | ORAL | 3 refills | Status: DC

## 2016-07-18 NOTE — Progress Notes (Signed)
   Subjective:    Patient ID: [name], male    DOB: 11/07/1945, 71 y.o.   MRN: [id number]  HPIFalling for the past 7 days. Cannot stand for more than 2 mins without falling. Called EMS to get him up after fall Patient did not have any headaches benign any unilateral numbness or weakness denies vomiting fever chills sweats. Activity level overall subpar recently weaker difficult time getting up  Homebound has peritoneal dialysis 3 times a week Has not had any recent fever chills or chest pressure tightness. Patient is debilitated. Has poor long-term outlook.  Review of Systems No current chest pain cough wheezing difficulty breathing no vomiting or diarrhea no fever or chills. No headache.    Objective:   Physical Exam Blood pressure 98/60 Lungs clear heart regular Abdomen soft Extremities no edema Good strength in both hands Fairly good strength in both legs but obviously patient two-week to stand Patient has his standard confusion consistent with vascular dementia Face-to-face evaluation was done for the purpose of evaluating this gentleman is health. It was determined that he was having significant trouble with ataxia weakness and mobility. It is also determined that this patient is homebound and it would pose a significant problem for him to the brought to the office or home health or physical therapy     Assessment & Plan:  Ataxia Leg weakness Will benefit from home health as well as home physical therapy I do not feel the patient is had a stroke If worsening problems to follow-up I do not recommend CAT scan her lab work currently  Patient may well need a lift chair, light weight wheelchair, bedside commode in order to meet ADLs  The wife is taking care of him and does not want him in a nursing home and possible

## 2016-07-18 NOTE — Telephone Encounter (Signed)
Spoke with patient's wife and patient's wife stated that patient had a fall on last Monday.Since fall patient has had difficulty standing and walking. At time of fall EMS was called but patient did not want to go to ER. Patient's wife was concerned of possible TIA. Spoke with Dr.Scott and was told to advise patient we can see him if he can safely be transported to office but if difficulty transporting patient will need to be seen at ED. Patient's wife verbalized understanding.

## 2016-07-18 NOTE — Telephone Encounter (Signed)
Pt is showing changes and wife was told to call. Wife is wanting a nurse to call her back.

## 2016-08-06 ENCOUNTER — Other Ambulatory Visit: Payer: Self-pay | Admitting: Cardiovascular Disease

## 2016-08-08 ENCOUNTER — Other Ambulatory Visit: Payer: Self-pay | Admitting: Cardiovascular Disease

## 2016-08-08 DIAGNOSIS — R296 Repeated falls: Secondary | ICD-10-CM | POA: Diagnosis not present

## 2016-08-08 DIAGNOSIS — R2689 Other abnormalities of gait and mobility: Secondary | ICD-10-CM | POA: Diagnosis not present

## 2016-08-08 DIAGNOSIS — M6281 Muscle weakness (generalized): Secondary | ICD-10-CM | POA: Diagnosis not present

## 2016-08-08 DIAGNOSIS — N186 End stage renal disease: Secondary | ICD-10-CM | POA: Diagnosis not present

## 2016-09-15 DIAGNOSIS — R296 Repeated falls: Secondary | ICD-10-CM | POA: Diagnosis not present

## 2016-09-15 DIAGNOSIS — M6281 Muscle weakness (generalized): Secondary | ICD-10-CM | POA: Diagnosis not present

## 2016-09-15 DIAGNOSIS — N186 End stage renal disease: Secondary | ICD-10-CM | POA: Diagnosis not present

## 2016-09-26 ENCOUNTER — Ambulatory Visit (INDEPENDENT_AMBULATORY_CARE_PROVIDER_SITE_OTHER): Payer: Medicare Other | Admitting: Family Medicine

## 2016-09-26 ENCOUNTER — Encounter: Payer: Self-pay | Admitting: Family Medicine

## 2016-09-26 VITALS — BP 110/64 | Ht 75.0 in

## 2016-09-26 DIAGNOSIS — N186 End stage renal disease: Secondary | ICD-10-CM

## 2016-09-26 DIAGNOSIS — I10 Essential (primary) hypertension: Secondary | ICD-10-CM

## 2016-09-26 DIAGNOSIS — F0150 Vascular dementia without behavioral disturbance: Secondary | ICD-10-CM

## 2016-09-26 DIAGNOSIS — I679 Cerebrovascular disease, unspecified: Secondary | ICD-10-CM

## 2016-09-26 NOTE — Progress Notes (Addendum)
   Subjective:    Patient ID: [name], male    DOB: 06/29/1945, 71 y.o.   MRN: [id number]  HPI  Patient arrives with his family to discuss major changes in patient's health. Wife reports the patient is refusing liquid nutrition and barely drinking and tried baby food for a few days This gentleman is on peritoneal dialysis. His wife is done a wonderful job of taking care of him. He is had years of progression in bad health. His issues are complicated by renal failure hypertension stroke and stroke related dementia The patient recently voiced to his wife that he did not 12 feeding any just wasn't hungry anymore. She denies any high fevers chills cough wheezing vomiting denies rectal bleeding hematuria Patient recently recently had lab work completed by the nephrologist which showed expected changes of chronic kidney disease but no acute findings Wife relates despite her best ever this patient has had significant trouble eating and drinking progressively worse over the past week he will name different foods he is interested in then he will just nipple on them then he will be done with them He is not interested in any extraordinary means he is a DO NOT RESUSCITATE  Review of Systems Denies cough vomiting wheezing does relate weakness dizziness recently had blood pressure medicine reduce because of low blood pressure poor appetite poor by mouth intake    Objective:   Physical Exam  Lungs are clear hearts regular abdomen soft extremities no edema skin warm dry patient in wheelchair week Has mild confusion is aware of his name location and time of year immediate recall 3 for 3 after distraction 1 for 3      Assessment & Plan:  Renal failure-in stage wife is doing dialysis but it is very much a sign that he is in the last stages of life  He is DO NOT RESUSCITATE. He does not want tube feeding. Does not want extraordinary means. He does not want extra testing  According to the family  quality of life is important and has been going way down  25 minutes was spent with the patient. Greater than half the time was spent in discussion and answering questions and counseling regarding the issues that the patient came in for today.  I believe that this patient is within the last 6 months of life. His death may be imminent if he continues not to eat or drink we will recheck him in 3 months I have stopped Catapres. Stop Zetia and stop Lipitor-for now they will continue. Tibial dialysis. They do not want any further extraordinary measures  After discussion with patient and family consultation with hospice  I have expressed to the wife that I can do a home visit within she has difficulty bringing him here   Addendum-patient was refused hospice because he is still doing dialysis. Patient had face-to-face visit with me on this day July 9. It is necessary for the patient to have home health. It is necessary for home health to do visitation to help with nutritional and hydration support. Patient is having poor eating and weight loss. Also necessary for nursing to do pain level assessment assist with medication and assist with all needs. This is medically necessary.

## 2016-09-27 ENCOUNTER — Telehealth: Payer: Self-pay | Admitting: Family Medicine

## 2016-09-27 DIAGNOSIS — N186 End stage renal disease: Secondary | ICD-10-CM

## 2016-09-27 NOTE — Progress Notes (Signed)
Spoke with Hospice. They will not accept patient since he is still doing dialysis. Left message for wife to inform her of that and offer her referral to [facility] per Dr.Scott Luking.

## 2016-09-27 NOTE — Telephone Encounter (Signed)
Discussed with pt's wife. She does want home health. Order for referral put in.

## 2016-09-27 NOTE — Telephone Encounter (Signed)
Left message return call 09/27/16. Called Hospice and was told that patient can not be accepted due to still continuing dialysis. Dr.Scott would like to know if family is interested in [facility] and having an aid come out to the house.

## 2016-10-04 ENCOUNTER — Telehealth: Payer: Self-pay | Admitting: Family Medicine

## 2016-10-04 NOTE — Telephone Encounter (Signed)
Please see note below for requirements per insurance for [place] to do home health eval for needs  Please advise when done    [name]  If Dr. [name] would make a short note Hospice refused patient due to having dialysis. Referral made for [facility] nursing to assess nutritional and hydration status, pain level, medications, assess all needs. Will also need an order in EPIC.   Thank you!

## 2016-10-04 NOTE — Telephone Encounter (Signed)
The addendum was completed, please put in order for home health because of weight loss and renal failure. Please forward message to Chauncey after orders are placed

## 2016-10-05 NOTE — Telephone Encounter (Signed)
Order for home health in [facility]

## 2016-10-06 ENCOUNTER — Ambulatory Visit: Payer: Medicare Other | Admitting: Family Medicine

## 2016-10-08 ENCOUNTER — Other Ambulatory Visit: Payer: Self-pay | Admitting: Family Medicine

## 2016-10-10 ENCOUNTER — Telehealth: Payer: Self-pay | Admitting: Family Medicine

## 2016-10-10 DIAGNOSIS — N186 End stage renal disease: Secondary | ICD-10-CM

## 2016-10-10 NOTE — Telephone Encounter (Signed)
[name] stopped his dialysis on Friday and they are needing hospice. Please advise.

## 2016-10-10 NOTE — Telephone Encounter (Signed)
Pt's wife is wanting Hospice to be called today.

## 2016-10-10 NOTE — Telephone Encounter (Signed)
Please proceed forward with consulting hospice-given that the patient is not doing dialysis he is now terminal and/comfort care-please let hospice know that we are willing to be his signing physician

## 2016-10-10 NOTE — Telephone Encounter (Signed)
Last seen 09/26/16 

## 2016-10-11 ENCOUNTER — Telehealth: Payer: Self-pay | Admitting: Family Medicine

## 2016-10-11 NOTE — Telephone Encounter (Signed)
Tried to call no answer

## 2016-10-11 NOTE — Telephone Encounter (Signed)
Spoke Fatima Blank  at hospice and informed him per [facility]-  It is fine to call in Restoril for patient to sleep. Rick verbalized understanding.   Liliane Channel may be reached at [phone]

## 2016-10-11 NOTE — Telephone Encounter (Signed)
Spoke with patient's wife and informed her that we are consulting hospice and that Dr.Scott is willing to be the signing physician patient's wife verbalized understanding. Referral ordered in epic.

## 2016-10-11 NOTE — Telephone Encounter (Signed)
That would be fine-if he has any other needs please let me know

## 2016-10-11 NOTE — Telephone Encounter (Signed)
Received a phone call from Teresita at [facility] asking for if they may give patient Restoril for insomnia. This medication is a standing order for hospice so no prescription is needed. Please advise?

## 2016-10-13 ENCOUNTER — Telehealth: Payer: Self-pay | Admitting: Family Medicine

## 2016-10-13 NOTE — Telephone Encounter (Signed)
Letter faxed to number as requested.

## 2016-10-13 NOTE — Telephone Encounter (Signed)
Pt needs [place] to send a discharge order for his diallysis Pt is now under Hospice care  Fax# [phone]

## 2016-10-13 NOTE — Telephone Encounter (Signed)
Please write out on letterhead that dialysis may be stopped. Patient under hospice care. I will sign then this will need to be faxed

## 2016-11-19 DEATH — deceased

## 2016-12-27 ENCOUNTER — Ambulatory Visit: Payer: Medicare Other | Admitting: Family Medicine

## 2017-01-21 IMAGING — CT CT ABD-PELV W/O CM
2 of 8 series · 15 of 46 positions shown, 17 images · non-contrast
Comparison: CT 07/18/2015.

CLINICAL DATA: Patient with history of retroperitoneal bleed.
Follow-up evaluation.

EXAM:
CT ABDOMEN AND PELVIS WITHOUT CONTRAST
TECHNIQUE: Multidetector CT imaging of the abdomen and pelvis was performed
following the standard protocol without IV contrast.

[Series 2: abdomen/pelvis w/o contrast · axial · non-contrast · 0.66mm/px · z∈[-435,-40]mm · 12 of 89 slices shown, 14 images]
[im 5/89  soft-tissue]
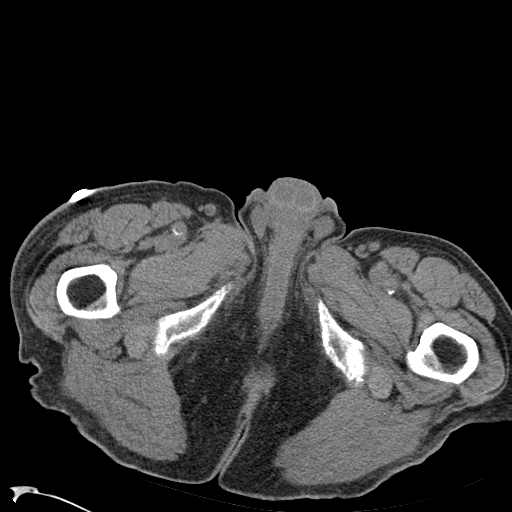
[im 5/89  bone]
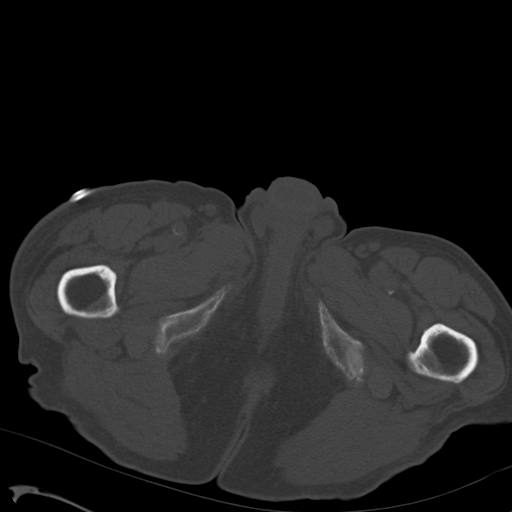
[im 15/89  soft-tissue]
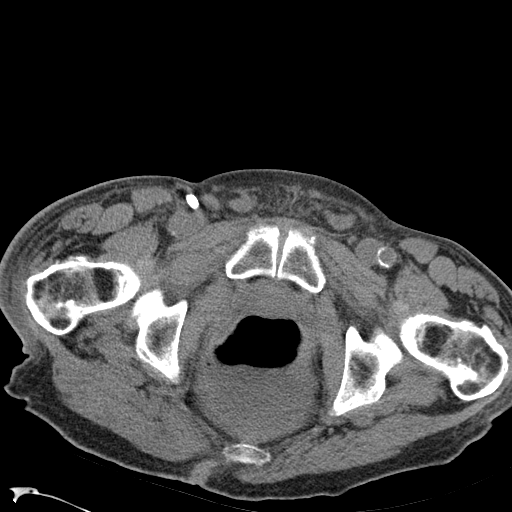
[im 20/89  soft-tissue]
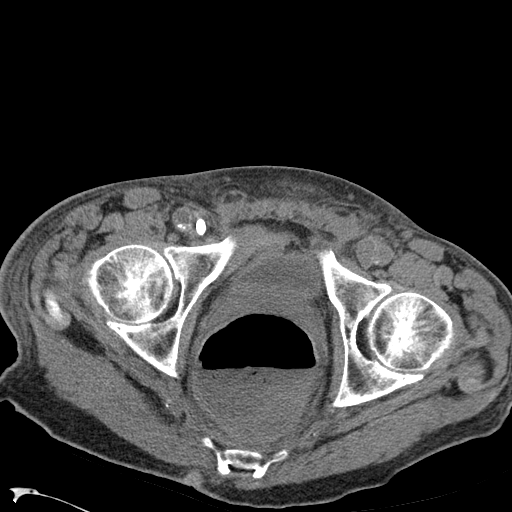
[im 25/89  soft-tissue]
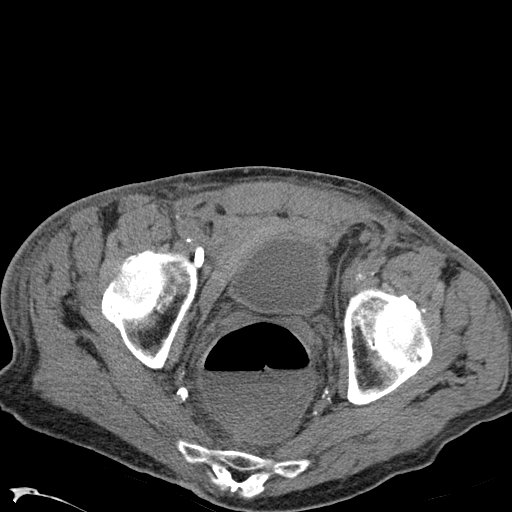
[im 35/89  soft-tissue]
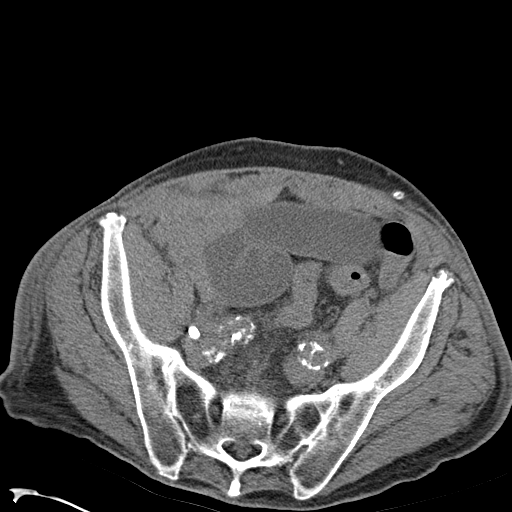
[im 40/89  soft-tissue]
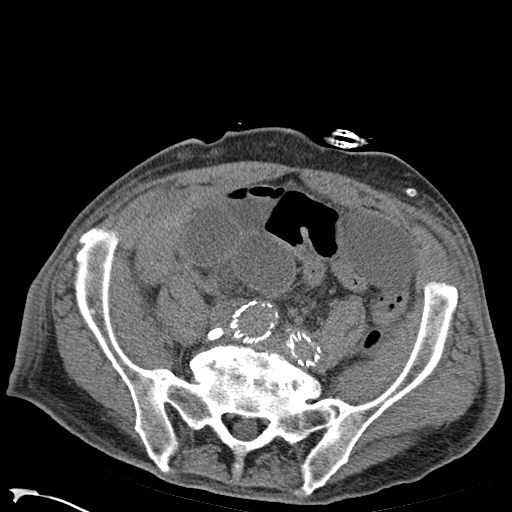
[im 49/89  soft-tissue]
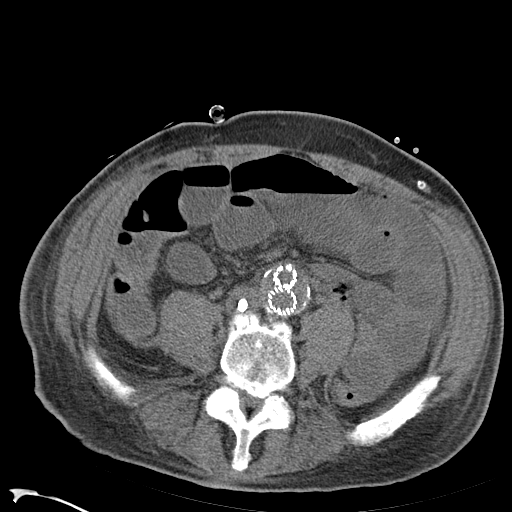
[im 54/89  soft-tissue]
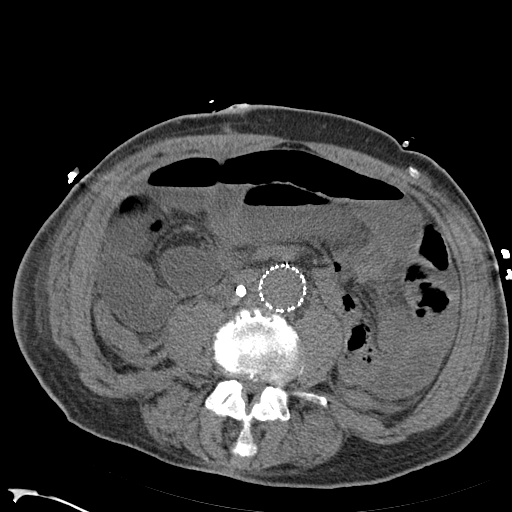
[im 64/89  soft-tissue]
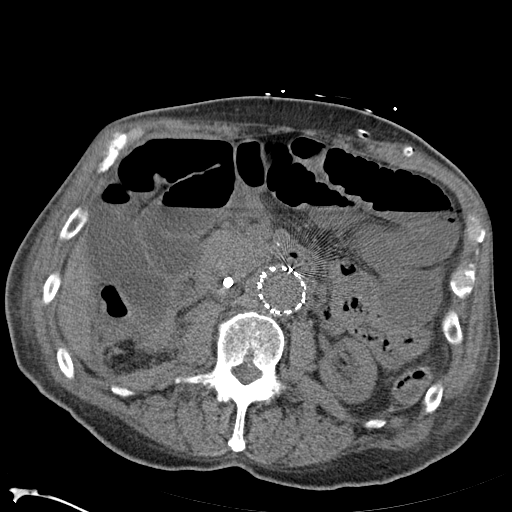
[im 64/89  bone]
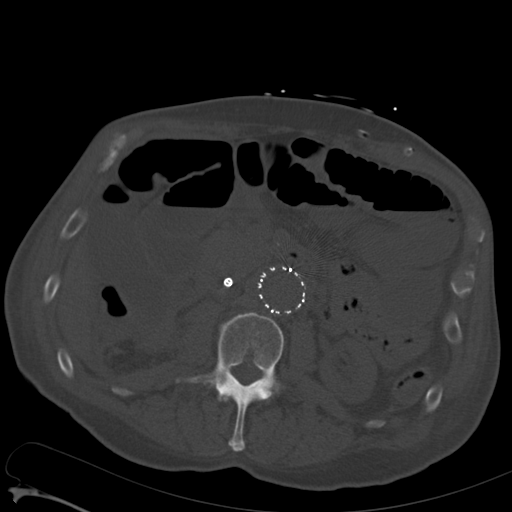
[im 69/89  soft-tissue]
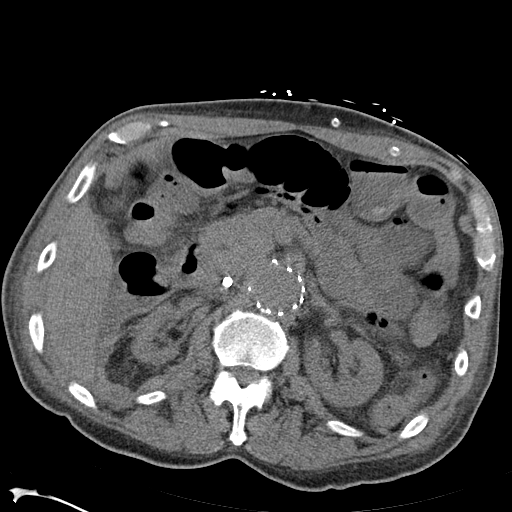
[im 74/89  soft-tissue]
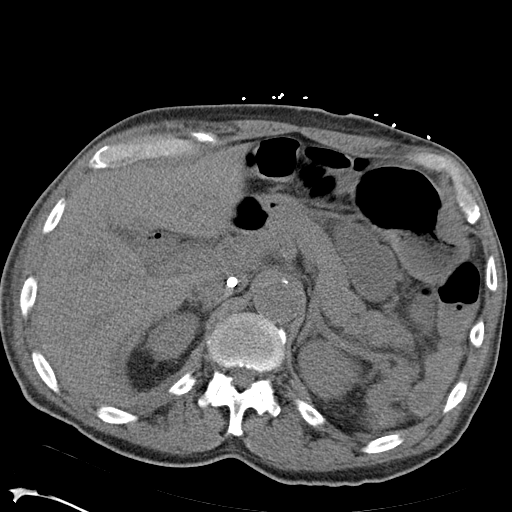
[im 84/89  soft-tissue]
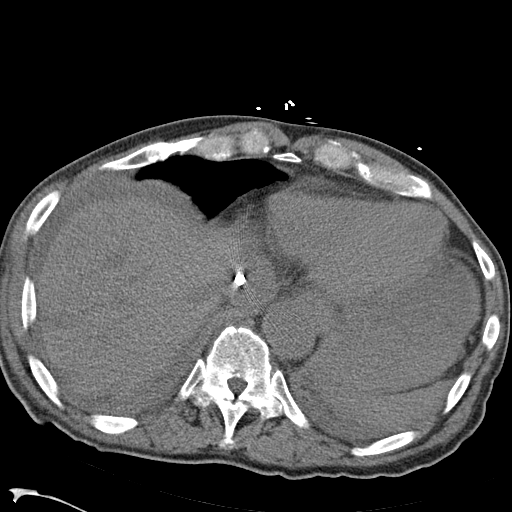

[Series 4: mpr cor (id) · coronal · 0.71mm/px · 3 of 92 slices shown]
[im 23/92  soft-tissue]
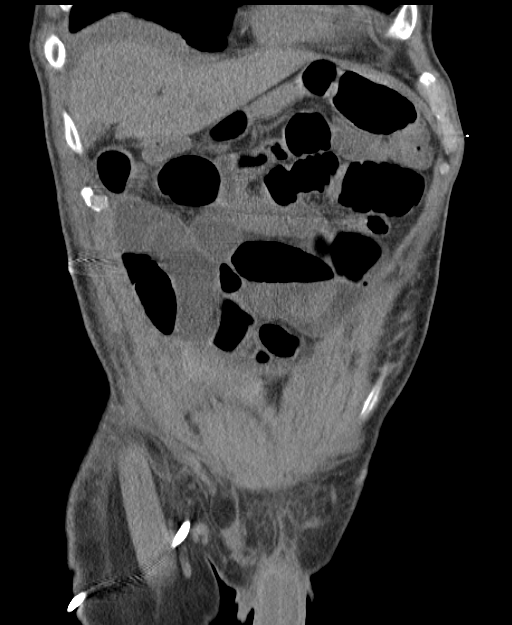
[im 46/92  soft-tissue]
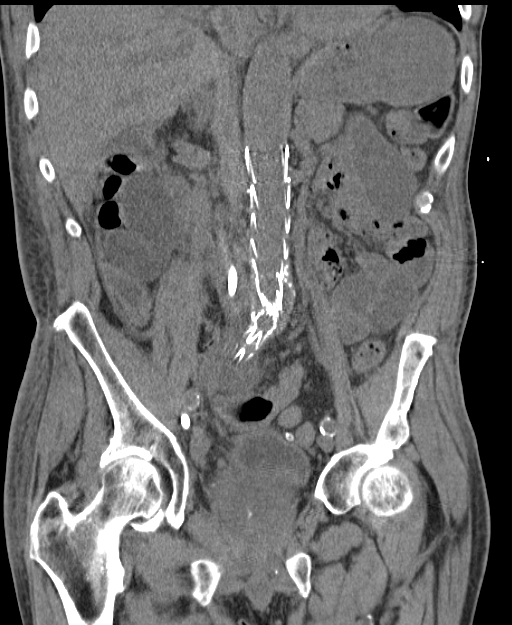
[im 69/92  soft-tissue]
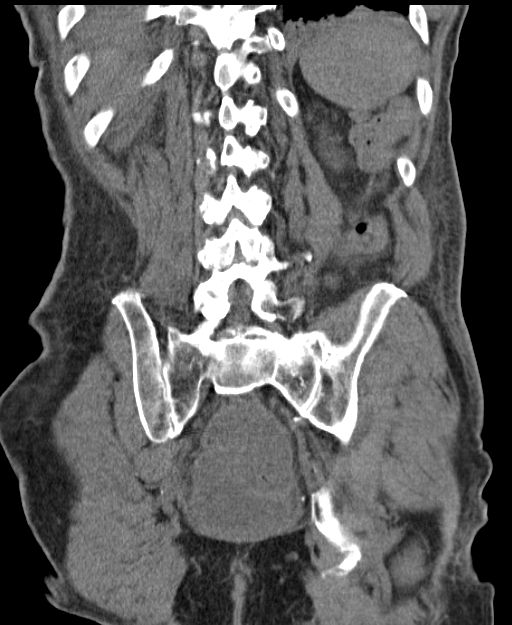

[15 of 46 positions shown; findings below may reference images not displayed]

FINDINGS: Lower chest: Focal consolidation within the right greater than left
lower lobes. Normal heart size.

Hepatobiliary: Liver is normal in size and contour. Gallbladder is
decompressed and contains multiple stones. Near the distal common
bile duct, in the region pancreatic head, there is a 5 mm calcific
density (image 23; series 2).

Pancreas: Normal in size and contour.

Spleen: Unremarkable

Adrenals/Urinary Tract: Stable bilateral adrenal gland thickening.
Atrophy of the right kidney. No hydronephrosis.

Stomach/Bowel: Fluid and stool within the rectum. Multiple gaseous
and fluid filled loops of small bowel are demonstrated within the
central abdomen measuring up to 4 cm. There is transition of
decompressed small bowel distally within the right lower quadrant.

Vascular/Lymphatic: Unchanged endovascular abdominal aortic stent
graft. No retroperitoneal adenopathy.

Other: Peritoneal dialysis catheter terminates within the pelvis.
Interval decrease in size of previously described right
retroperitoneal and hematoma (extending along the right psoas muscle
and within the pelvis anteriorly) with residual blood products
measuring approximately 5.3 x 1.8 cm (image 26; series 2).
Collection within the pelvis measures approximately 13 x 4 cm,
previously 16 x 7 cm. Additionally there is decreased blood products
within the right hemipelvis. Right femoral venous catheter extends
to the heart, tip not included on current exam.

Musculoskeletal: No aggressive or acute appearing osseous lesions.
IMPRESSION: Interval decrease in size of retroperitoneal and pelvic hematoma
located within the right hemipelvis along the right psoas muscle.

Peritoneal dialysis catheter terminates within the pelvis.

Distended loops of small bowel within the central abdomen measuring
up to 4 cm concerning for early and/or partial small bowel
obstruction with transition to decompressed small bowel in the right
lower quadrant.

Right greater than left lower lobe consolidation which may represent
pneumonia.

5 mm calcific density near the distal common bile duct within the
region of the pancreatic head is nonspecific and may represent
choledocholithiasis or potentially an adjacent pancreatic
parenchymal calcification. Consider correlation with laboratory
analysis as well as MRCP/ERCP as clinically indicated.

## 2017-07-12 ENCOUNTER — Ambulatory Visit: Payer: Medicare Other | Admitting: Family

## 2017-07-12 ENCOUNTER — Other Ambulatory Visit (HOSPITAL_COMMUNITY): Payer: Medicare Other
